# Patient Record
Sex: Female | Born: 1945
Health system: Southern US, Community
[De-identification: ages and names within clinical notes are randomized; demographics above are authoritative.]

## PROBLEM LIST (undated history)

## (undated) DIAGNOSIS — K909 Intestinal malabsorption, unspecified: Secondary | ICD-10-CM

## (undated) DIAGNOSIS — F329 Major depressive disorder, single episode, unspecified: Secondary | ICD-10-CM

## (undated) DIAGNOSIS — F419 Anxiety disorder, unspecified: Secondary | ICD-10-CM

## (undated) DIAGNOSIS — H9190 Unspecified hearing loss, unspecified ear: Secondary | ICD-10-CM

## (undated) DIAGNOSIS — D649 Anemia, unspecified: Secondary | ICD-10-CM

## (undated) DIAGNOSIS — Z7189 Other specified counseling: Secondary | ICD-10-CM

## (undated) DIAGNOSIS — N898 Other specified noninflammatory disorders of vagina: Secondary | ICD-10-CM

## (undated) DIAGNOSIS — H04123 Dry eye syndrome of bilateral lacrimal glands: Secondary | ICD-10-CM

## (undated) DIAGNOSIS — C9000 Multiple myeloma not having achieved remission: Secondary | ICD-10-CM

## (undated) DIAGNOSIS — I10 Essential (primary) hypertension: Secondary | ICD-10-CM

## (undated) DIAGNOSIS — D500 Iron deficiency anemia secondary to blood loss (chronic): Secondary | ICD-10-CM

## (undated) DIAGNOSIS — H269 Unspecified cataract: Secondary | ICD-10-CM

## (undated) DIAGNOSIS — C4490 Unspecified malignant neoplasm of skin, unspecified: Secondary | ICD-10-CM

## (undated) DIAGNOSIS — F32A Depression, unspecified: Secondary | ICD-10-CM

## (undated) DIAGNOSIS — E785 Hyperlipidemia, unspecified: Secondary | ICD-10-CM

## (undated) DIAGNOSIS — R002 Palpitations: Secondary | ICD-10-CM

## (undated) DIAGNOSIS — Z923 Personal history of irradiation: Secondary | ICD-10-CM

## (undated) DIAGNOSIS — M1990 Unspecified osteoarthritis, unspecified site: Secondary | ICD-10-CM

## (undated) DIAGNOSIS — G4700 Insomnia, unspecified: Secondary | ICD-10-CM

## (undated) HISTORY — DX: Dry eye syndrome of bilateral lacrimal glands: H04.123

## (undated) HISTORY — DX: Insomnia, unspecified: G47.00

## (undated) HISTORY — PX: WISDOM TOOTH EXTRACTION: SHX21

## (undated) HISTORY — DX: Hyperlipidemia, unspecified: E78.5

## (undated) HISTORY — DX: Personal history of irradiation: Z92.3

## (undated) HISTORY — DX: Other specified noninflammatory disorders of vagina: N89.8

## (undated) HISTORY — PX: EYE SURGERY: SHX253

## (undated) HISTORY — DX: Other specified counseling: Z71.89

## (undated) HISTORY — DX: Depression, unspecified: F32.A

## (undated) HISTORY — PX: FACIAL COSMETIC SURGERY: SHX629

## (undated) HISTORY — PX: NO PAST SURGERIES: SHX2092

## (undated) HISTORY — DX: Multiple myeloma not having achieved remission: C90.00

## (undated) HISTORY — DX: Major depressive disorder, single episode, unspecified: F32.9

## (undated) HISTORY — DX: Palpitations: R00.2

---
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
## 1997-11-21 ENCOUNTER — Ambulatory Visit (HOSPITAL_COMMUNITY): Admission: RE | Admit: 1997-11-21 | Discharge: 1997-11-21 | Payer: Self-pay | Admitting: Cardiology

## 2001-06-25 ENCOUNTER — Other Ambulatory Visit: Admission: RE | Admit: 2001-06-25 | Discharge: 2001-06-25 | Payer: Self-pay | Admitting: *Deleted

## 2003-08-04 ENCOUNTER — Other Ambulatory Visit: Admission: RE | Admit: 2003-08-04 | Discharge: 2003-08-04 | Payer: Self-pay | Admitting: Internal Medicine

## 2005-10-14 ENCOUNTER — Other Ambulatory Visit: Admission: RE | Admit: 2005-10-14 | Discharge: 2005-10-14 | Payer: Self-pay | Admitting: Internal Medicine

## 2006-10-20 ENCOUNTER — Other Ambulatory Visit: Admission: RE | Admit: 2006-10-20 | Discharge: 2006-10-20 | Payer: Self-pay | Admitting: Internal Medicine

## 2008-05-16 ENCOUNTER — Ambulatory Visit: Payer: Self-pay | Admitting: Internal Medicine

## 2008-11-03 ENCOUNTER — Ambulatory Visit: Payer: Self-pay | Admitting: Internal Medicine

## 2009-05-16 ENCOUNTER — Ambulatory Visit: Payer: Self-pay | Admitting: Internal Medicine

## 2009-06-13 ENCOUNTER — Ambulatory Visit: Payer: Self-pay | Admitting: Internal Medicine

## 2009-07-10 ENCOUNTER — Ambulatory Visit: Payer: Self-pay | Admitting: Internal Medicine

## 2009-08-07 ENCOUNTER — Ambulatory Visit: Payer: Self-pay | Admitting: Internal Medicine

## 2009-12-08 ENCOUNTER — Encounter: Admission: RE | Admit: 2009-12-08 | Discharge: 2009-12-08 | Payer: Self-pay | Admitting: Internal Medicine

## 2010-01-01 ENCOUNTER — Ambulatory Visit: Payer: Self-pay | Admitting: Internal Medicine

## 2010-02-13 ENCOUNTER — Other Ambulatory Visit: Admission: RE | Admit: 2010-02-13 | Discharge: 2010-02-13 | Payer: Self-pay | Admitting: Internal Medicine

## 2010-02-13 ENCOUNTER — Ambulatory Visit: Payer: Self-pay | Admitting: Internal Medicine

## 2010-08-17 ENCOUNTER — Ambulatory Visit
Admission: RE | Admit: 2010-08-17 | Discharge: 2010-08-17 | Payer: Self-pay | Source: Home / Self Care | Attending: Internal Medicine | Admitting: Internal Medicine

## 2010-12-10 ENCOUNTER — Other Ambulatory Visit: Payer: Self-pay | Admitting: *Deleted

## 2010-12-10 DIAGNOSIS — I10 Essential (primary) hypertension: Secondary | ICD-10-CM

## 2010-12-10 MED ORDER — NEBIVOLOL HCL 5 MG PO TABS: 5.0000 mg | ORAL_TABLET | Freq: Every day | ORAL | Status: DC

## 2011-02-11 ENCOUNTER — Encounter: Payer: Self-pay | Admitting: Internal Medicine

## 2011-02-12 ENCOUNTER — Other Ambulatory Visit: Payer: BC Managed Care – PPO | Admitting: Internal Medicine

## 2011-02-12 DIAGNOSIS — E785 Hyperlipidemia, unspecified: Secondary | ICD-10-CM

## 2011-02-12 DIAGNOSIS — Z79899 Other long term (current) drug therapy: Secondary | ICD-10-CM

## 2011-02-12 LAB — HEPATIC FUNCTION PANEL
AST: 22 U/L (ref 0–37)
Alkaline Phosphatase: 73 U/L (ref 39–117)
Bilirubin, Direct: 0.1 mg/dL (ref 0.0–0.3)
Total Bilirubin: 0.5 mg/dL (ref 0.3–1.2)
Total Protein: 7.1 g/dL (ref 6.0–8.3)

## 2011-02-12 LAB — LIPID PANEL
HDL: 71 mg/dL (ref >39)
Triglycerides: 73 mg/dL (ref <150)
VLDL: 15 mg/dL (ref 0–40)

## 2011-02-14 ENCOUNTER — Ambulatory Visit (INDEPENDENT_AMBULATORY_CARE_PROVIDER_SITE_OTHER): Payer: BC Managed Care – PPO | Admitting: Internal Medicine

## 2011-02-14 ENCOUNTER — Encounter: Payer: Self-pay | Admitting: Internal Medicine

## 2011-02-14 DIAGNOSIS — E785 Hyperlipidemia, unspecified: Secondary | ICD-10-CM

## 2011-02-14 DIAGNOSIS — I10 Essential (primary) hypertension: Secondary | ICD-10-CM

## 2011-02-14 DIAGNOSIS — F419 Anxiety disorder, unspecified: Secondary | ICD-10-CM

## 2011-02-14 DIAGNOSIS — F411 Generalized anxiety disorder: Secondary | ICD-10-CM

## 2011-02-15 ENCOUNTER — Encounter: Payer: Self-pay | Admitting: Internal Medicine

## 2011-02-15 DIAGNOSIS — F419 Anxiety disorder, unspecified: Secondary | ICD-10-CM | POA: Insufficient documentation

## 2011-02-15 DIAGNOSIS — E785 Hyperlipidemia, unspecified: Secondary | ICD-10-CM | POA: Insufficient documentation

## 2011-02-15 DIAGNOSIS — I10 Essential (primary) hypertension: Secondary | ICD-10-CM | POA: Insufficient documentation

## 2011-02-15 NOTE — Patient Instructions (Signed)
Continue medications as previously prescribed. Consider counselor for discussion regarding anxiety and issues surrounding of memory

## 2011-02-15 NOTE — Progress Notes (Signed)
  Subjective:    Patient ID: Debra Ruiz, female    DOB: Apr 17, 1946, 65 y.o.   MRN: [id number]  HPI  in today to followup on hyperlipidemia. Recent fasting lipid panel and liver functions are very acceptable. She is on generic Lipitor 20 mg daily. She has some anxiety regarding memory loss. Several female family members on maternal side have developed dementia and she is concerned. She has some forgetfulness but not what I would call early dementia. She also has hypertension and is on HCTZ and Norvasc 5 mg daily. Blood pressure is a bit elevated today but she seems anxious. I recommended a counselor for her to see her regarding anxiety and she will consider it. She will be enrolling in Medicare soon and is looking for a Medicare supplement.    Review of Systems     Objective:   Physical Exam chest clear; cardiac exam regular rate and rhythm, normal S1/S2; extremities without edema        Assessment & Plan:   Hyperlipidemia  Hypertension  Anxiety  Plan is to return in 6 months for physical exam

## 2011-03-04 ENCOUNTER — Other Ambulatory Visit: Payer: Self-pay | Admitting: Internal Medicine

## 2011-06-17 ENCOUNTER — Encounter: Payer: Self-pay | Admitting: Internal Medicine

## 2011-06-17 ENCOUNTER — Ambulatory Visit (INDEPENDENT_AMBULATORY_CARE_PROVIDER_SITE_OTHER): Payer: Medicare Other | Admitting: Internal Medicine

## 2011-06-17 ENCOUNTER — Ambulatory Visit: Payer: BC Managed Care – PPO | Admitting: Internal Medicine

## 2011-06-17 DIAGNOSIS — F329 Major depressive disorder, single episode, unspecified: Secondary | ICD-10-CM

## 2011-06-17 DIAGNOSIS — F3289 Other specified depressive episodes: Secondary | ICD-10-CM

## 2011-06-17 DIAGNOSIS — F419 Anxiety disorder, unspecified: Secondary | ICD-10-CM

## 2011-06-17 DIAGNOSIS — F32A Depression, unspecified: Secondary | ICD-10-CM

## 2011-06-17 DIAGNOSIS — F411 Generalized anxiety disorder: Secondary | ICD-10-CM

## 2011-06-17 NOTE — Patient Instructions (Signed)
Stop Prozac and began taking Pristiq 50 mg daily. Return in 3 weeks. We will arrange for neuropsychological testing

## 2011-06-17 NOTE — Progress Notes (Signed)
  Subjective:    Patient ID: Debra Ruiz, female    DOB: [birth year], 65 y.o.   MRN: [id number]  HPI patient has taken Prozac for many years for depression. She is also anxious. Her mother and her maternal grandmother both had dementia presumably Alzheimer's type. This has worried her for a long time that she might develop the same thing. Also, she is angry at her husband because a we'll he made included he has 2 adult children. The will included her but not to the extent she thought she should be. She also was concerned that 2 nephews on her side of the family were left out at the wheel. Says that she's been sure with her husband recently. She and her husband each drank about 4 glasses of wine a night have done this for years. She says that "my thinking is not right ". She is seeing [facility] for counseling. Sharl Ma suggested some neuropsychological testing. We will try to arrange that. Also she's not had fasting labs in several months and we will arrange that in the near future. She has an upcoming appointment in late January for physical examination. She has no children. She is worried about having to go to the memory care Center should she develop dementia. She is afraid she will have enough money to do that. Suggested she might want to speak with the financial counselor but she doesn't think her husband will be agreeable to that. She is not suicidal. Does not want influenza immunization. Wants to try something other than Prozac.    Review of Systems     Objective:   Physical Exam no thyromegaly; chest clear; cardiac exam regular rate and rhythm        Assessment & Plan:  Anxiety  Depression  Plan: Patient will be referred for neuropsychological testing. She'll have fasting lab work including TSH year through this office in the near future. I have started her on Pristiq 50 mg daily. She will discontinue Prozac. Return in 3 weeks. Time spent with patient 30 minutes

## 2011-06-21 ENCOUNTER — Other Ambulatory Visit: Payer: Medicare Other | Admitting: Internal Medicine

## 2011-06-21 DIAGNOSIS — Z0000 Encounter for general adult medical examination without abnormal findings: Secondary | ICD-10-CM

## 2011-06-21 DIAGNOSIS — E785 Hyperlipidemia, unspecified: Secondary | ICD-10-CM

## 2011-06-21 DIAGNOSIS — I10 Essential (primary) hypertension: Secondary | ICD-10-CM

## 2011-06-21 DIAGNOSIS — E559 Vitamin D deficiency, unspecified: Secondary | ICD-10-CM

## 2011-06-21 LAB — COMPREHENSIVE METABOLIC PANEL
CO2: 29 mEq/L (ref 19–32)
Sodium: 140 mEq/L (ref 135–145)
Total Bilirubin: 0.5 mg/dL (ref 0.3–1.2)
Total Protein: 7.4 g/dL (ref 6.0–8.3)

## 2011-06-21 LAB — CBC WITH DIFFERENTIAL/PLATELET
Basophils Absolute: 0 10*3/uL (ref 0.0–0.1)
Hemoglobin: 15.3 g/dL — ABNORMAL HIGH (ref 12.0–15.0)
Lymphocytes Relative: 43 % (ref 12–46)
MCHC: 34.2 g/dL (ref 30.0–36.0)
MCV: 96.8 fL (ref 78.0–100.0)
Monocytes Absolute: 0.5 10*3/uL (ref 0.1–1.0)
Neutro Abs: 2.3 10*3/uL (ref 1.7–7.7)
Neutrophils Relative %: 45 % (ref 43–77)
RBC: 4.63 MIL/uL (ref 3.87–5.11)

## 2011-06-21 LAB — TSH: TSH: 2.378 u[IU]/mL (ref 0.350–4.500)

## 2011-06-21 LAB — LIPID PANEL
HDL: 59 mg/dL (ref >39)
LDL Cholesterol: 89 mg/dL (ref 0–99)
Triglycerides: 92 mg/dL (ref <150)

## 2011-06-22 LAB — VITAMIN D 25 HYDROXY (VIT D DEFICIENCY, FRACTURES): Vit D, 25-Hydroxy: 63 ng/mL (ref 30–89)

## 2011-07-08 ENCOUNTER — Ambulatory Visit (INDEPENDENT_AMBULATORY_CARE_PROVIDER_SITE_OTHER): Payer: Medicare Other | Admitting: Internal Medicine

## 2011-07-08 ENCOUNTER — Encounter: Payer: Self-pay | Admitting: Internal Medicine

## 2011-07-08 VITALS — BP 140/76 | HR 76 | Temp 98.1°F | Wt 123.8 lb

## 2011-07-08 DIAGNOSIS — F411 Generalized anxiety disorder: Secondary | ICD-10-CM

## 2011-07-08 DIAGNOSIS — F32A Depression, unspecified: Secondary | ICD-10-CM

## 2011-07-08 DIAGNOSIS — F3289 Other specified depressive episodes: Secondary | ICD-10-CM

## 2011-07-08 DIAGNOSIS — F329 Major depressive disorder, single episode, unspecified: Secondary | ICD-10-CM

## 2011-07-08 DIAGNOSIS — F419 Anxiety disorder, unspecified: Secondary | ICD-10-CM

## 2011-07-08 DIAGNOSIS — R413 Other amnesia: Secondary | ICD-10-CM

## 2011-07-08 NOTE — Progress Notes (Signed)
  Subjective:    Patient ID: Debra Ruiz, female    DOB: 02-04-1946, 65 y.o.   MRN: [id number]  HPI three-week followup today of anxiety and depression. At last visit was changed from Prozac to Pristiq 50 mg daily. She looks good today and feels like she has more energy. I have given her samples of prostate at last visit and she became agitated when she could not find one packet of samples. She subsequently found it after she looked around a bit but this was worrisome to her. Feels that she is irritable with her husband. Continues to see [facility] for counseling. We are trying to get her neuropsychological testing but have had some difficulty getting an appointment. We'll continue to pursue this but she wants to wait until the first of the year since she is going to [place].    Review of Systems     Objective:   Physical Exam alert and oriented x3. Chest clear to auscultation; cardiac exam regular rate and rhythm; extremities without edema. Affect is slightly anxious.        Assessment & Plan:  Anxiety  Depression  Forgetfulness with family history of dementia  Plan: Patient had been scheduled for physical exam in January but we are going to defer that until June since regarding done an extensive workup recently. Continue Pristiq 50 mg daily. 5 weeks worth of samples given. In January she'll go on Medicare. Continue to try to get appointment for neuropsychological testing January 2013.

## 2011-07-08 NOTE — Patient Instructions (Signed)
Continue prostate 50 mg daily. We will continue to pursue appointment for neuropsychological testing in January. Return in 6 months or soon her if needed.

## 2011-07-29 ENCOUNTER — Telehealth: Payer: Self-pay | Admitting: Internal Medicine

## 2011-07-29 NOTE — Telephone Encounter (Signed)
Spoke with Debra Ruiz from Dr. Karma Ganja office.  Pt is scheduled to see him on Wednesday, January 23rd at 11:30.  Patient is aware.

## 2011-07-30 DIAGNOSIS — H01009 Unspecified blepharitis unspecified eye, unspecified eyelid: Secondary | ICD-10-CM | POA: Diagnosis not present

## 2011-07-30 DIAGNOSIS — H04129 Dry eye syndrome of unspecified lacrimal gland: Secondary | ICD-10-CM | POA: Diagnosis not present

## 2011-07-30 DIAGNOSIS — H52209 Unspecified astigmatism, unspecified eye: Secondary | ICD-10-CM | POA: Diagnosis not present

## 2011-07-30 DIAGNOSIS — H5200 Hypermetropia, unspecified eye: Secondary | ICD-10-CM | POA: Diagnosis not present

## 2011-08-14 DIAGNOSIS — F3289 Other specified depressive episodes: Secondary | ICD-10-CM | POA: Diagnosis not present

## 2011-08-14 DIAGNOSIS — I10 Essential (primary) hypertension: Secondary | ICD-10-CM | POA: Diagnosis not present

## 2011-08-14 DIAGNOSIS — F329 Major depressive disorder, single episode, unspecified: Secondary | ICD-10-CM | POA: Diagnosis not present

## 2011-08-14 DIAGNOSIS — F411 Generalized anxiety disorder: Secondary | ICD-10-CM | POA: Diagnosis not present

## 2011-08-15 ENCOUNTER — Telehealth: Payer: Self-pay | Admitting: Internal Medicine

## 2011-08-16 NOTE — Telephone Encounter (Signed)
Dr Baxley made aware.   

## 2011-09-11 ENCOUNTER — Telehealth: Payer: Self-pay | Admitting: Internal Medicine

## 2011-09-11 NOTE — Telephone Encounter (Signed)
Pt called back and stated when she called the pharmacy to have them send [place] a refill request, they stated a her prescription for Lexapro had already been called in.  Patient asked [place] to disregard her refill request.

## 2011-09-24 ENCOUNTER — Other Ambulatory Visit: Payer: Self-pay | Admitting: Internal Medicine

## 2011-11-01 ENCOUNTER — Other Ambulatory Visit: Payer: Self-pay | Admitting: Internal Medicine

## 2011-11-01 ENCOUNTER — Other Ambulatory Visit: Payer: Self-pay

## 2011-11-01 MED ORDER — POTASSIUM CHLORIDE CRYS ER 20 MEQ PO TBCR: 20.0000 meq | EXTENDED_RELEASE_TABLET | Freq: Every day | ORAL | Status: DC

## 2011-11-06 ENCOUNTER — Other Ambulatory Visit: Payer: Self-pay | Admitting: Internal Medicine

## 2011-12-23 ENCOUNTER — Other Ambulatory Visit: Payer: Medicare Other | Admitting: Internal Medicine

## 2011-12-23 DIAGNOSIS — M810 Age-related osteoporosis without current pathological fracture: Secondary | ICD-10-CM

## 2011-12-23 DIAGNOSIS — E785 Hyperlipidemia, unspecified: Secondary | ICD-10-CM | POA: Diagnosis not present

## 2011-12-23 DIAGNOSIS — Z780 Asymptomatic menopausal state: Secondary | ICD-10-CM | POA: Diagnosis not present

## 2011-12-23 DIAGNOSIS — I10 Essential (primary) hypertension: Secondary | ICD-10-CM

## 2011-12-23 LAB — COMPREHENSIVE METABOLIC PANEL
AST: 20 U/L (ref 0–37)
Albumin: 4.4 g/dL (ref 3.5–5.2)
Alkaline Phosphatase: 63 U/L (ref 39–117)
Total Bilirubin: 0.5 mg/dL (ref 0.3–1.2)
Total Protein: 6.9 g/dL (ref 6.0–8.3)

## 2011-12-23 LAB — LIPID PANEL
Cholesterol: 185 mg/dL (ref 0–200)
LDL Cholesterol: 104 mg/dL — ABNORMAL HIGH (ref 0–99)
Triglycerides: 69 mg/dL (ref <150)
VLDL: 14 mg/dL (ref 0–40)

## 2011-12-23 LAB — CBC WITH DIFFERENTIAL/PLATELET
Basophils Absolute: 0 10*3/uL (ref 0.0–0.1)
HCT: 41.9 % (ref 36.0–46.0)
Lymphocytes Relative: 51 % — ABNORMAL HIGH (ref 12–46)
MCH: 31.9 pg (ref 26.0–34.0)
MCV: 92.3 fL (ref 78.0–100.0)
Monocytes Absolute: 0.3 10*3/uL (ref 0.1–1.0)
Monocytes Relative: 9 % (ref 3–12)
Neutro Abs: 1.4 10*3/uL — ABNORMAL LOW (ref 1.7–7.7)
Platelets: 257 10*3/uL (ref 150–400)
RDW: 13.3 % (ref 11.5–15.5)
WBC: 3.7 10*3/uL — ABNORMAL LOW (ref 4.0–10.5)

## 2011-12-24 ENCOUNTER — Encounter: Payer: Self-pay | Admitting: Internal Medicine

## 2011-12-24 ENCOUNTER — Ambulatory Visit (INDEPENDENT_AMBULATORY_CARE_PROVIDER_SITE_OTHER): Payer: Medicare Other | Admitting: Internal Medicine

## 2011-12-24 VITALS — BP 136/64 | HR 72 | Ht 61.0 in | Wt 126.0 lb

## 2011-12-24 DIAGNOSIS — Z0000 Encounter for general adult medical examination without abnormal findings: Secondary | ICD-10-CM | POA: Diagnosis not present

## 2011-12-24 DIAGNOSIS — H04129 Dry eye syndrome of unspecified lacrimal gland: Secondary | ICD-10-CM

## 2011-12-24 DIAGNOSIS — H9190 Unspecified hearing loss, unspecified ear: Secondary | ICD-10-CM

## 2011-12-24 DIAGNOSIS — M545 Low back pain, unspecified: Secondary | ICD-10-CM

## 2011-12-24 DIAGNOSIS — K219 Gastro-esophageal reflux disease without esophagitis: Secondary | ICD-10-CM | POA: Diagnosis not present

## 2011-12-24 DIAGNOSIS — I10 Essential (primary) hypertension: Secondary | ICD-10-CM

## 2011-12-24 LAB — POCT URINALYSIS DIPSTICK
Blood, UA: NEGATIVE
Glucose, UA: NEGATIVE
Ketones, UA: NEGATIVE
Leukocytes, UA: NEGATIVE
Nitrite, UA: NEGATIVE
Urobilinogen, UA: NEGATIVE
pH, UA: 6

## 2011-12-24 LAB — VITAMIN D 25 HYDROXY (VIT D DEFICIENCY, FRACTURES): Vit D, 25-Hydroxy: 48 ng/mL (ref 30–89)

## 2012-01-27 DIAGNOSIS — H04129 Dry eye syndrome of unspecified lacrimal gland: Secondary | ICD-10-CM | POA: Diagnosis not present

## 2012-02-12 DIAGNOSIS — H01009 Unspecified blepharitis unspecified eye, unspecified eyelid: Secondary | ICD-10-CM | POA: Diagnosis not present

## 2012-02-12 DIAGNOSIS — H04129 Dry eye syndrome of unspecified lacrimal gland: Secondary | ICD-10-CM | POA: Diagnosis not present

## 2012-03-12 DIAGNOSIS — H01009 Unspecified blepharitis unspecified eye, unspecified eyelid: Secondary | ICD-10-CM | POA: Diagnosis not present

## 2012-03-12 DIAGNOSIS — H04129 Dry eye syndrome of unspecified lacrimal gland: Secondary | ICD-10-CM | POA: Diagnosis not present

## 2012-03-21 DIAGNOSIS — H04129 Dry eye syndrome of unspecified lacrimal gland: Secondary | ICD-10-CM | POA: Insufficient documentation

## 2012-03-21 DIAGNOSIS — K219 Gastro-esophageal reflux disease without esophagitis: Secondary | ICD-10-CM | POA: Insufficient documentation

## 2012-03-21 DIAGNOSIS — M545 Low back pain, unspecified: Secondary | ICD-10-CM | POA: Insufficient documentation

## 2012-03-21 DIAGNOSIS — H9190 Unspecified hearing loss, unspecified ear: Secondary | ICD-10-CM | POA: Insufficient documentation

## 2012-03-21 NOTE — Patient Instructions (Addendum)
Please continue same medications and return in 6 months for office visit, blood pressure check, fasting lipid panel, liver functions. If he will not have colonoscopy, please return in 3 Hemoccult cards to the office. Bone density study due 2014. Continue with annual mammogram and annual influenza immunization.

## 2012-03-21 NOTE — Progress Notes (Signed)
Subjective:    Patient ID: Debra Ruiz, female    DOB: 1945/08/31, 66 y.o.   MRN: [id number]  HPI 66 year old white female in today for Welcome to Medicare exam and evaluation of medical problems including depression, hypertension, hyperlipidemia, GE reflux, insomnia, and patient concerned about her memory. She had a Pap smear and July 2011 which was normal. In 2011, she had issues with back pain and saw Dr. Darrelyn Hillock treated her with a 12 day prednisone dosepak. She subsequently saw Dr. Danielle Dess for evaluation and improved without having epidural steroid injections. MRI of the LS-spine in May 2011 showed left foraminal narrowing due to disc and facet arthropathy. She has spondylosis at L3-L4 where there was moderately severe central canal stenosis and advanced facet arthropathy resulting in anterolisthesis.  Also had marked left foraminal narrowing at L4-L5 due to disc disease and facet arthropathy.  She had a normal bone density study 02/21/2010. Gets annual mammogram. Had Zostavax vaccine for 08/25/06 at [facility]. She has history of dry eye syndrome and uses Restasis per ophthalmologist. Had sigmoidoscopy by Dr. Matthias Hughs around the year 2000. Never had colonoscopy and was asking 2010 to consider 1. A Cardiolite study was done in 1999 which was negative for ischemia done by Dr. Donnie Aho.  Patient unable to tolerate Crestor according to old records when she was under the care of Dr. Heather Roberts as apparently liver functions increased.  No history of operations, fractures, or illnesses requiring hospitalization.  Family history: Father died at age 27 of an MI with history of hypertension. Mother died at age 85 of a stroke with history of hypertension. Brother with history of brain tumor and cardiac defibrillator. Has a twin sister who lives in [place] .  Sister with history of hypertension and hyperlipidemia. There is a family history of Alzheimer's disease.  Patient was  referred to [facility] in the fall of 2012 for counseling. She also was evaluated at [facility] neurology Department in 2006. I do not have a copy of that report.  She is currently a nonsmoker but used to smoke years ago. Drinks alcohol for 5 times a week. Is married. History of chronic dysthymia and fear of getting Alzheimer's disease. This dates back to at least 2002 according to Dr. Donnamarie Poag records. She grew up in [place], [place] and came to [place] 1979 to work for an [employer]. She's been married 3 times. 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 History of urge urinary incontinence. Tubal ligation in 1977. No children.  She was sent to [facility] neuropsychiatry in January 2013 for evaluation of possible memory disorder. She had good verbal memory but some impairment with visual memory. She noted improvement with depression being changed from Prozac to Pristiq. It was recommended she be retested in one year.    Review of Systems  Constitutional: Negative.   HENT: Negative.   Eyes:       History of dry eyes  Cardiovascular: Negative.   Gastrointestinal:       History of GE reflux  Genitourinary: Positive for urgency.       History of vaginal dryness  Musculoskeletal: Positive for back pain.  Hematological: Negative.   Psychiatric/Behavioral: Positive for dysphoric mood.       Insomnia and concerns about memory       Objective:   Physical Exam  Vitals reviewed. Constitutional: She is oriented to person, place, and time. She appears well-developed and well-nourished.  HENT:  Head: Normocephalic and atraumatic.  Right Ear: External ear normal.  Left Ear: External ear normal.  Mouth/Throat: Oropharynx is clear and moist.  Eyes: Conjunctivae and EOM are normal. Pupils are equal, round, and reactive to light. Right eye exhibits no discharge. Left eye exhibits no discharge. No scleral icterus.  Neck: Neck  supple. No JVD present. No thyromegaly present.  Cardiovascular: Normal rate, regular rhythm and normal heart sounds.   No murmur heard. Pulmonary/Chest: Effort normal and breath sounds normal. She has no wheezes. She has no rales.  Abdominal: Soft. Bowel sounds are normal. She exhibits no distension and no mass. There is no tenderness. There is no rebound and no guarding.  Genitourinary:       Bimanual exam normal  Musculoskeletal: Normal range of motion. She exhibits no edema.  Lymphadenopathy:    She has no cervical adenopathy.  Neurological: She is oriented to person, place, and time. She has normal reflexes. No cranial nerve deficit. Coordination normal.  Skin: Skin is warm and dry. No rash noted.  Psychiatric: She has a normal mood and affect. Her behavior is normal. Judgment and thought content normal.    Subjective:   Patient presents for Medicare Annual/Subsequent preventive examination.  Review Past Medical/Family/Social: See above history   Risk Factors  Current exercise habits: some walking Dietary issues discussed: Low fat Low carb diet  Cardiac risk factors: HTN, Hyperlipidemia, Family History  Depression Screen  (Note: if answer to either of the following is "Yes", a more complete depression screening is indicated)   Over the past two weeks, have you felt down, depressed or hopeless? yes  Over the past two weeks, have you felt little interest or pleasure in doing things? yes Have you lost interest or pleasure in daily life? No Do you often feel hopeless? rarely Do you cry easily over simple problems? No   Activities of Daily Living  In your present state of health, do you have any difficulty performing the following activities?:   Driving? No  Managing money? No  Feeding yourself? No  Getting from bed to chair? No  Climbing a flight of stairs? No  Preparing food and eating?: No  Bathing or showering? No  Getting dressed: No  Getting to the toilet? No    Using the toilet:No  Moving around from place to place: No  In the past year have you fallen or had a near fall?:No  Are you sexually active? No  Do you have more than one partner? No   Hearing Difficulties: No  Do you often ask people to speak up or repeat themselves? No  Do you experience ringing or noises in your ears? No  Do you have difficulty understanding soft or whispered voices? yes Do you feel that you have a problem with memory? yes Do you often misplace items? No    Home Safety:  Do you have a smoke alarm at your residence? Yes Do you have grab bars in the bathroom? No   Do you have throw rugs in your house? yes   Cognitive Testing  Alert? Yes Normal Appearance?Yes  Oriented to person? Yes Place? Yes  Time? Yes  Recall of three objects? Yes  Can perform simple calculations? Yes  Displays appropriate judgment?Yes  Can read the correct time from a watch face?Yes   List the Names of Other Physician/Practitioners you currently use:  See referral list for the physicians patient is currently seeing.     Review of Systems:   Objective:  General appearance: Appears stated age  Head: Normocephalic, without obvious abnormality, atraumatic  Eyes: conj clear, EOMi PEERLA  Ears: normal TM's and external ear canals both ears  Nose: Nares normal. Septum midline. Mucosa normal. No drainage or sinus tenderness.  Throat: lips, mucosa, and tongue normal; teeth and gums normal  Neck: no adenopathy, no carotid bruit, no JVD, supple, symmetrical, trachea midline and thyroid not enlarged, symmetric, no tenderness/mass/nodules  No CVA tenderness.  Lungs: clear to auscultation bilaterally  Breasts: normal appearance, no masses or tenderness,  Heart: regular rate and rhythm, S1, S2 normal, no murmur, click, rub or gallop  Abdomen: soft, non-tender; bowel sounds normal; no masses, no organomegaly  Musculoskeletal: ROM normal in all joints, no crepitus, no deformity, Normal  muscle strengthen. Back  is symmetric, no curvature. Skin: Skin color, texture, turgor normal. No rashes or lesions  Lymph nodes: Cervical, supraclavicular, and axillary nodes normal.  Neurologic: CN 2 -12 Normal, Normal symmetric reflexes. Normal coordination and gait  Psych: Alert & Oriented x 3, Mood appears stable.    Assessment:    Annual wellness medicare exam   Plan:    During the course of the visit the patient was educated and counseled about appropriate screening and preventive services including:   Colonoscopy recommended. Patient will consider it. Suggest repeat testing in [facility] by Dr. Shane Crutch in 1-2 years for cognitive deficits.       Patient Instructions (the written plan) was given to the patient.  Medicare Attestation  I have personally reviewed:  The patient's medical and social history  Their use of alcohol, tobacco or illicit drugs  Their current medications and supplements  The patient's functional ability including ADLs,fall risks, home safety risks, cognitive, and hearing and visual impairment  Diet and physical activities  Evidence for depression or mood disorders  The patient's weight, height, BMI, and visual acuity have been recorded in the chart. I have made referrals, counseling, and provided education to the patient based on review of the above and I have provided the patient with a written personalized care plan for preventive services.            Assessment & Plan:  Hypertension  Hyperlipidemia  Anxiety depression-concern for memory loss. Has been tested by [facility] neuropsychiatry. Improvement on Pristiq  History of hearing loss-evaluated by Dr. Tia Masker. Says she may need to have hearing aids in the future.  GE reflux  Dry eye syndrome  Plan: Annual mammogram, bone density study done in 2011 was normal and can be repeated in 2014. Return in 6 months for office visit, blood pressure check, fasting lipid  panel liver functions on statin therapy. Recommend colonoscopy. If she will not consider that, suggested she return 3 Hemoccult cards to the office.

## 2012-03-24 ENCOUNTER — Other Ambulatory Visit: Payer: Self-pay | Admitting: Internal Medicine

## 2012-03-24 ENCOUNTER — Other Ambulatory Visit: Payer: Self-pay

## 2012-03-24 DIAGNOSIS — I10 Essential (primary) hypertension: Secondary | ICD-10-CM

## 2012-03-24 MED ORDER — AMLODIPINE BESYLATE 5 MG PO TABS: 5.0000 mg | ORAL_TABLET | Freq: Every day | ORAL | Status: DC

## 2012-03-24 NOTE — Telephone Encounter (Signed)
Refill amlodipine prn one year to pharmacy

## 2012-03-24 NOTE — Telephone Encounter (Signed)
Have re ordered in [facility]

## 2012-04-23 DIAGNOSIS — H10409 Unspecified chronic conjunctivitis, unspecified eye: Secondary | ICD-10-CM | POA: Diagnosis not present

## 2012-04-23 DIAGNOSIS — H04129 Dry eye syndrome of unspecified lacrimal gland: Secondary | ICD-10-CM | POA: Diagnosis not present

## 2012-04-23 DIAGNOSIS — H01009 Unspecified blepharitis unspecified eye, unspecified eyelid: Secondary | ICD-10-CM | POA: Diagnosis not present

## 2012-06-12 DIAGNOSIS — Z13820 Encounter for screening for osteoporosis: Secondary | ICD-10-CM | POA: Diagnosis not present

## 2012-06-12 DIAGNOSIS — Z780 Asymptomatic menopausal state: Secondary | ICD-10-CM | POA: Diagnosis not present

## 2012-06-12 DIAGNOSIS — Z1231 Encounter for screening mammogram for malignant neoplasm of breast: Secondary | ICD-10-CM | POA: Diagnosis not present

## 2012-06-22 ENCOUNTER — Other Ambulatory Visit: Payer: Medicare Other | Admitting: Internal Medicine

## 2012-06-23 ENCOUNTER — Ambulatory Visit: Payer: Medicare Other | Admitting: Internal Medicine

## 2012-06-30 ENCOUNTER — Other Ambulatory Visit: Payer: Medicare Other | Admitting: Internal Medicine

## 2012-06-30 DIAGNOSIS — E785 Hyperlipidemia, unspecified: Secondary | ICD-10-CM | POA: Diagnosis not present

## 2012-06-30 DIAGNOSIS — Z79899 Other long term (current) drug therapy: Secondary | ICD-10-CM

## 2012-06-30 LAB — LIPID PANEL
Cholesterol: 189 mg/dL (ref 0–200)
HDL: 57 mg/dL
LDL Cholesterol: 113 mg/dL — ABNORMAL HIGH (ref 0–99)
Total CHOL/HDL Ratio: 3.3 ratio
Triglycerides: 96 mg/dL
VLDL: 19 mg/dL (ref 0–40)

## 2012-06-30 LAB — HEPATIC FUNCTION PANEL
ALT: 20 U/L (ref 0–35)
AST: 22 U/L (ref 0–37)
Albumin: 4.5 g/dL (ref 3.5–5.2)
Alkaline Phosphatase: 67 U/L (ref 39–117)
Bilirubin, Direct: 0.2 mg/dL (ref 0.0–0.3)
Indirect Bilirubin: 0.6 mg/dL (ref 0.0–0.9)
Total Bilirubin: 0.8 mg/dL (ref 0.3–1.2)
Total Protein: 7.2 g/dL (ref 6.0–8.3)

## 2012-07-01 DIAGNOSIS — H01009 Unspecified blepharitis unspecified eye, unspecified eyelid: Secondary | ICD-10-CM | POA: Insufficient documentation

## 2012-07-01 DIAGNOSIS — H0289 Other specified disorders of eyelid: Secondary | ICD-10-CM | POA: Diagnosis not present

## 2012-07-01 DIAGNOSIS — H2510 Age-related nuclear cataract, unspecified eye: Secondary | ICD-10-CM | POA: Diagnosis not present

## 2012-07-01 DIAGNOSIS — IMO0002 Reserved for concepts with insufficient information to code with codable children: Secondary | ICD-10-CM | POA: Insufficient documentation

## 2012-07-01 DIAGNOSIS — H02889 Meibomian gland dysfunction of unspecified eye, unspecified eyelid: Secondary | ICD-10-CM | POA: Insufficient documentation

## 2012-07-02 ENCOUNTER — Ambulatory Visit (INDEPENDENT_AMBULATORY_CARE_PROVIDER_SITE_OTHER): Payer: Medicare Other | Admitting: Internal Medicine

## 2012-07-02 ENCOUNTER — Encounter: Payer: Self-pay | Admitting: Internal Medicine

## 2012-07-02 VITALS — BP 134/70 | HR 68 | Resp 16 | Wt 128.0 lb

## 2012-07-02 DIAGNOSIS — T148XXA Other injury of unspecified body region, initial encounter: Secondary | ICD-10-CM

## 2012-07-02 DIAGNOSIS — E785 Hyperlipidemia, unspecified: Secondary | ICD-10-CM | POA: Diagnosis not present

## 2012-07-02 DIAGNOSIS — I10 Essential (primary) hypertension: Secondary | ICD-10-CM | POA: Diagnosis not present

## 2012-07-02 NOTE — Progress Notes (Signed)
  Subjective:    Patient ID: Davis Vannatter, female    DOB: 06/03/1946, 66 y.o.   MRN: [id number]  HPI 66 year old female in today for followup of hypertension and hyperlipidemia. Lipid panel essentially the same as it was in August 2013. No complaints or problems. In good spirits. Seems less anxious and worried. Declines influenza immunization today. Blood pressure under good control on current regimen. Going to [place] [employer] on vacation in January.  She took a fall about a month ago with some slippery shoes and bruised her left anterior leg. Still a bit swollen but not contused anymore    Review of Systems     Objective:   Physical Exam neck supple without thyromegaly JVD or carotid bruits. Chest clear to auscultation. Cardiac exam regular rate and rhythm normal S1 and S2. Extremities without edema.  Left anterior leg below left knee slightly swollen but no contusion. Good range of motion.      Assessment & Plan:  Contusion left anterior leg  Hypertension  Hyperlipidemia  Plan: Return September 2014 for physical examination. Continue same medications.

## 2012-07-02 NOTE — Patient Instructions (Addendum)
Continue same medications and return in early September 2014 for physical examination. Last physical exam 03/21/2012

## 2012-08-19 DIAGNOSIS — H2510 Age-related nuclear cataract, unspecified eye: Secondary | ICD-10-CM | POA: Diagnosis not present

## 2012-08-19 DIAGNOSIS — H0289 Other specified disorders of eyelid: Secondary | ICD-10-CM | POA: Diagnosis not present

## 2012-08-21 ENCOUNTER — Other Ambulatory Visit: Payer: Self-pay | Admitting: Internal Medicine

## 2012-08-28 ENCOUNTER — Other Ambulatory Visit: Payer: Self-pay | Admitting: Internal Medicine

## 2012-09-23 ENCOUNTER — Other Ambulatory Visit: Payer: Self-pay | Admitting: Dermatology

## 2012-09-23 DIAGNOSIS — D485 Neoplasm of uncertain behavior of skin: Secondary | ICD-10-CM | POA: Diagnosis not present

## 2012-09-23 DIAGNOSIS — L820 Inflamed seborrheic keratosis: Secondary | ICD-10-CM | POA: Diagnosis not present

## 2012-09-23 DIAGNOSIS — D234 Other benign neoplasm of skin of scalp and neck: Secondary | ICD-10-CM | POA: Diagnosis not present

## 2012-10-12 DIAGNOSIS — H0289 Other specified disorders of eyelid: Secondary | ICD-10-CM | POA: Diagnosis not present

## 2012-10-12 DIAGNOSIS — H2510 Age-related nuclear cataract, unspecified eye: Secondary | ICD-10-CM | POA: Diagnosis not present

## 2012-11-18 ENCOUNTER — Other Ambulatory Visit: Payer: Self-pay | Admitting: Internal Medicine

## 2013-01-19 ENCOUNTER — Other Ambulatory Visit: Payer: Self-pay | Admitting: Internal Medicine

## 2013-02-03 DIAGNOSIS — H2510 Age-related nuclear cataract, unspecified eye: Secondary | ICD-10-CM | POA: Diagnosis not present

## 2013-02-03 DIAGNOSIS — H0289 Other specified disorders of eyelid: Secondary | ICD-10-CM | POA: Diagnosis not present

## 2013-02-15 ENCOUNTER — Other Ambulatory Visit: Payer: Self-pay | Admitting: Internal Medicine

## 2013-03-10 DIAGNOSIS — H2510 Age-related nuclear cataract, unspecified eye: Secondary | ICD-10-CM | POA: Diagnosis not present

## 2013-03-10 DIAGNOSIS — H0289 Other specified disorders of eyelid: Secondary | ICD-10-CM | POA: Diagnosis not present

## 2013-05-05 ENCOUNTER — Other Ambulatory Visit: Payer: Self-pay | Admitting: *Deleted

## 2013-05-05 ENCOUNTER — Other Ambulatory Visit: Payer: Self-pay | Admitting: Internal Medicine

## 2013-05-05 MED ORDER — HYDROCHLOROTHIAZIDE 25 MG PO TABS: 25.0000 mg | ORAL_TABLET | Freq: Every day | ORAL | Status: DC

## 2013-05-05 MED ORDER — POTASSIUM CHLORIDE CRYS ER 20 MEQ PO TBCR: 20.0000 meq | EXTENDED_RELEASE_TABLET | Freq: Every day | ORAL | Status: DC

## 2013-05-17 ENCOUNTER — Encounter: Payer: Self-pay | Admitting: Internal Medicine

## 2013-05-17 ENCOUNTER — Ambulatory Visit (INDEPENDENT_AMBULATORY_CARE_PROVIDER_SITE_OTHER): Payer: Medicare Other | Admitting: Internal Medicine

## 2013-05-17 VITALS — BP 110/70 | HR 100 | Temp 97.8°F | Wt 127.0 lb

## 2013-05-17 DIAGNOSIS — F32A Depression, unspecified: Secondary | ICD-10-CM

## 2013-05-17 DIAGNOSIS — F419 Anxiety disorder, unspecified: Secondary | ICD-10-CM

## 2013-05-17 DIAGNOSIS — I10 Essential (primary) hypertension: Secondary | ICD-10-CM

## 2013-05-17 DIAGNOSIS — E785 Hyperlipidemia, unspecified: Secondary | ICD-10-CM | POA: Diagnosis not present

## 2013-05-17 DIAGNOSIS — F329 Major depressive disorder, single episode, unspecified: Secondary | ICD-10-CM

## 2013-05-17 DIAGNOSIS — F341 Dysthymic disorder: Secondary | ICD-10-CM

## 2013-05-17 DIAGNOSIS — Z23 Encounter for immunization: Secondary | ICD-10-CM | POA: Diagnosis not present

## 2013-05-17 DIAGNOSIS — E876 Hypokalemia: Secondary | ICD-10-CM

## 2013-05-17 MED ORDER — POTASSIUM CHLORIDE CRYS ER 20 MEQ PO TBCR: 20.0000 meq | EXTENDED_RELEASE_TABLET | Freq: Every day | ORAL | Status: DC

## 2013-05-17 MED ORDER — ESCITALOPRAM OXALATE 10 MG PO TABS: ORAL_TABLET | ORAL | Status: DC

## 2013-05-17 MED ORDER — HYDROCHLOROTHIAZIDE 25 MG PO TABS: ORAL_TABLET | ORAL | Status: DC

## 2013-05-17 MED ORDER — AMLODIPINE BESYLATE 5 MG PO TABS: 5.0000 mg | ORAL_TABLET | Freq: Every day | ORAL | Status: DC

## 2013-05-17 MED ORDER — BYSTOLIC 5 MG PO TABS: ORAL_TABLET | ORAL | Status: DC

## 2013-05-17 MED ORDER — ATORVASTATIN CALCIUM 20 MG PO TABS: ORAL_TABLET | ORAL | Status: DC

## 2013-05-17 NOTE — Patient Instructions (Addendum)
Pneumovax immunization given today. Return in 2 weeks for tetanus immunization. Flu vaccine declined. Return in 2 weeks for fasting lab work including fasting lipid panel liver functions and B-met. Schedule physical exam in 6 months.

## 2013-05-18 ENCOUNTER — Other Ambulatory Visit: Payer: Self-pay | Admitting: Internal Medicine

## 2013-05-23 ENCOUNTER — Encounter: Payer: Self-pay | Admitting: Internal Medicine

## 2013-05-23 NOTE — Progress Notes (Signed)
  Subjective:    Patient ID: Debra Ruiz, female    DOB: 1945/12/26, 67 y.o.   MRN: [id number]  HPI For  recheck on hypertension, depression, and hyperlipidemia. No complaints or problems. Feels well. She takes generic Lipitor, Norvasc, potassium supplement, diuretic, Bystolic and Lexapro.    Review of Systems     Objective:   Physical Exam skin is warm and dry. Nodes none. Chest clear to auscultation. Cardiac exam regular rate and rhythm normal S1 and S2. Extremities without edema. No JVD thyromegaly or carotid bruits. Affect is normal.        Assessment & Plan:  Depression-stable  Hypertension-under good control on current regimen  Hyperlipidemia  Hypokalemia on diuretic  Plan: Return in 6 months for physical exam.  In 2 weeks will return for tetanus immunization. She also will need to have fasting lipid panel liver functions and basic metabolic panel then. Pneumovax immunization given today.

## 2013-06-03 ENCOUNTER — Other Ambulatory Visit (INDEPENDENT_AMBULATORY_CARE_PROVIDER_SITE_OTHER): Payer: Medicare Other | Admitting: Internal Medicine

## 2013-06-03 DIAGNOSIS — E785 Hyperlipidemia, unspecified: Secondary | ICD-10-CM | POA: Diagnosis not present

## 2013-06-03 DIAGNOSIS — Z23 Encounter for immunization: Secondary | ICD-10-CM | POA: Diagnosis not present

## 2013-06-03 DIAGNOSIS — I10 Essential (primary) hypertension: Secondary | ICD-10-CM

## 2013-06-03 DIAGNOSIS — Z79899 Other long term (current) drug therapy: Secondary | ICD-10-CM

## 2013-06-03 LAB — HEPATIC FUNCTION PANEL
Albumin: 4.3 g/dL (ref 3.5–5.2)
Alkaline Phosphatase: 78 U/L (ref 39–117)
Bilirubin, Direct: 0.1 mg/dL (ref 0.0–0.3)
Indirect Bilirubin: 0.7 mg/dL (ref 0.0–0.9)
Total Bilirubin: 0.8 mg/dL (ref 0.3–1.2)

## 2013-06-03 LAB — BASIC METABOLIC PANEL
CO2: 29 mEq/L (ref 19–32)
Calcium: 9.5 mg/dL (ref 8.4–10.5)
Chloride: 100 mEq/L (ref 96–112)
Creat: 0.61 mg/dL (ref 0.50–1.10)
Glucose, Bld: 102 mg/dL — ABNORMAL HIGH (ref 70–99)

## 2013-06-03 LAB — LIPID PANEL
Cholesterol: 189 mg/dL (ref 0–200)
HDL: 60 mg/dL (ref >39)
LDL Cholesterol: 108 mg/dL — ABNORMAL HIGH (ref 0–99)

## 2013-06-09 DIAGNOSIS — H0289 Other specified disorders of eyelid: Secondary | ICD-10-CM | POA: Diagnosis not present

## 2013-06-09 DIAGNOSIS — H2510 Age-related nuclear cataract, unspecified eye: Secondary | ICD-10-CM | POA: Diagnosis not present

## 2013-09-24 DIAGNOSIS — D1801 Hemangioma of skin and subcutaneous tissue: Secondary | ICD-10-CM | POA: Diagnosis not present

## 2013-09-24 DIAGNOSIS — L820 Inflamed seborrheic keratosis: Secondary | ICD-10-CM | POA: Diagnosis not present

## 2013-09-24 DIAGNOSIS — L821 Other seborrheic keratosis: Secondary | ICD-10-CM | POA: Diagnosis not present

## 2013-09-24 DIAGNOSIS — D239 Other benign neoplasm of skin, unspecified: Secondary | ICD-10-CM | POA: Diagnosis not present

## 2013-09-24 DIAGNOSIS — L819 Disorder of pigmentation, unspecified: Secondary | ICD-10-CM | POA: Diagnosis not present

## 2013-09-28 ENCOUNTER — Ambulatory Visit (INDEPENDENT_AMBULATORY_CARE_PROVIDER_SITE_OTHER): Payer: Medicare Other | Admitting: Podiatry

## 2013-09-28 ENCOUNTER — Ambulatory Visit (INDEPENDENT_AMBULATORY_CARE_PROVIDER_SITE_OTHER): Payer: Medicare Other

## 2013-09-28 ENCOUNTER — Encounter: Payer: Self-pay | Admitting: Podiatry

## 2013-09-28 VITALS — BP 129/74 | HR 75 | Resp 16 | Ht 61.5 in | Wt 127.0 lb

## 2013-09-28 DIAGNOSIS — M2040 Other hammer toe(s) (acquired), unspecified foot: Secondary | ICD-10-CM

## 2013-09-28 DIAGNOSIS — M2010 Hallux valgus (acquired), unspecified foot: Secondary | ICD-10-CM

## 2013-09-28 DIAGNOSIS — M2041 Other hammer toe(s) (acquired), right foot: Secondary | ICD-10-CM

## 2013-09-28 NOTE — Progress Notes (Signed)
   Subjective:    Patient ID: Debra Ruiz, female    DOB: [birth year], 68 y.o.   MRN: [id number]  HPI Comments: This toe it sticks up. i don't want surgery , just would like to know what to do to prevent it from getting worse      Review of Systems  All other systems reviewed and are negative.       Objective:   Physical Exam I have reviewed her past history medications allergies surgeries social history and review of systems. Pulses are strongly palpable bilateral. Neurologic sensorium is intact bilateral. Muscle strength is 5 over 5 dorsiflexors plantar flexors inverters everters all of his musculature is intact. Orthopedic evaluation demonstrates hallux abductovalgus deformity with hammertoe deformity second metatarsophalangeal joint right foot. Taylor's bunion deformity also noted bilateral. Right greater than left.        Assessment & Plan:  Assessment: Hallux abductovalgus deformity. Hammertoe deformity second digit bilateral. Taylor's bunion deformity bilateral.

## 2013-09-30 ENCOUNTER — Other Ambulatory Visit: Payer: Self-pay | Admitting: Internal Medicine

## 2013-10-05 DIAGNOSIS — H01009 Unspecified blepharitis unspecified eye, unspecified eyelid: Secondary | ICD-10-CM | POA: Diagnosis not present

## 2013-10-05 DIAGNOSIS — H259 Unspecified age-related cataract: Secondary | ICD-10-CM | POA: Diagnosis not present

## 2013-10-05 DIAGNOSIS — H04129 Dry eye syndrome of unspecified lacrimal gland: Secondary | ICD-10-CM | POA: Diagnosis not present

## 2013-10-05 DIAGNOSIS — H179 Unspecified corneal scar and opacity: Secondary | ICD-10-CM | POA: Diagnosis not present

## 2013-11-18 ENCOUNTER — Other Ambulatory Visit: Payer: Medicare Other | Admitting: Internal Medicine

## 2013-11-18 DIAGNOSIS — Z1329 Encounter for screening for other suspected endocrine disorder: Secondary | ICD-10-CM | POA: Diagnosis not present

## 2013-11-18 DIAGNOSIS — Z130 Encounter for screening for diseases of the blood and blood-forming organs and certain disorders involving the immune mechanism: Secondary | ICD-10-CM | POA: Diagnosis not present

## 2013-11-18 DIAGNOSIS — Z79899 Other long term (current) drug therapy: Secondary | ICD-10-CM | POA: Diagnosis not present

## 2013-11-18 DIAGNOSIS — E785 Hyperlipidemia, unspecified: Secondary | ICD-10-CM

## 2013-11-18 DIAGNOSIS — I10 Essential (primary) hypertension: Secondary | ICD-10-CM | POA: Diagnosis not present

## 2013-11-18 LAB — CBC WITH DIFFERENTIAL/PLATELET
Basophils Absolute: 0 10*3/uL (ref 0.0–0.1)
Basophils Relative: 1 % (ref 0–1)
EOS PCT: 2 % (ref 0–5)
Eosinophils Absolute: 0.1 10*3/uL (ref 0.0–0.7)
HEMATOCRIT: 41.2 % (ref 36.0–46.0)
Hemoglobin: 14.5 g/dL (ref 12.0–15.0)
LYMPHS ABS: 1.9 10*3/uL (ref 0.7–4.0)
LYMPHS PCT: 44 % (ref 12–46)
MCH: 32.4 pg (ref 26.0–34.0)
MCHC: 35.2 g/dL (ref 30.0–36.0)
MCV: 92 fL (ref 78.0–100.0)
Monocytes Absolute: 0.6 10*3/uL (ref 0.1–1.0)
Monocytes Relative: 13 % — ABNORMAL HIGH (ref 3–12)
Neutro Abs: 1.8 10*3/uL (ref 1.7–7.7)
Neutrophils Relative %: 40 % — ABNORMAL LOW (ref 43–77)
PLATELETS: 248 10*3/uL (ref 150–400)
RBC: 4.48 MIL/uL (ref 3.87–5.11)
RDW: 13.5 % (ref 11.5–15.5)
WBC: 4.4 10*3/uL (ref 4.0–10.5)

## 2013-11-18 LAB — LIPID PANEL
CHOLESTEROL: 172 mg/dL (ref 0–200)
HDL: 62 mg/dL (ref >39)
LDL Cholesterol: 94 mg/dL (ref 0–99)
Total CHOL/HDL Ratio: 2.8 Ratio
Triglycerides: 78 mg/dL (ref <150)
VLDL: 16 mg/dL (ref 0–40)

## 2013-11-18 LAB — COMPREHENSIVE METABOLIC PANEL
ALBUMIN: 4.2 g/dL (ref 3.5–5.2)
ALT: 19 U/L (ref 0–35)
AST: 21 U/L (ref 0–37)
Alkaline Phosphatase: 72 U/L (ref 39–117)
BUN: 17 mg/dL (ref 6–23)
CO2: 31 meq/L (ref 19–32)
CREATININE: 0.65 mg/dL (ref 0.50–1.10)
Calcium: 9 mg/dL (ref 8.4–10.5)
Chloride: 100 mEq/L (ref 96–112)
Glucose, Bld: 99 mg/dL (ref 70–99)
Potassium: 4 mEq/L (ref 3.5–5.3)
Sodium: 138 mEq/L (ref 135–145)
Total Bilirubin: 0.5 mg/dL (ref 0.2–1.2)
Total Protein: 7.8 g/dL (ref 6.0–8.3)

## 2013-11-18 LAB — TSH: TSH: 2.043 u[IU]/mL (ref 0.350–4.500)

## 2013-11-19 ENCOUNTER — Encounter: Payer: Self-pay | Admitting: Internal Medicine

## 2013-11-19 ENCOUNTER — Other Ambulatory Visit (HOSPITAL_COMMUNITY)
Admission: RE | Admit: 2013-11-19 | Discharge: 2013-11-19 | Disposition: A | Discharge status: Home / Self Care | Payer: Medicare Other | Source: Ambulatory Visit | Attending: Internal Medicine | Admitting: Internal Medicine

## 2013-11-19 ENCOUNTER — Ambulatory Visit (INDEPENDENT_AMBULATORY_CARE_PROVIDER_SITE_OTHER): Payer: Medicare Other | Admitting: Internal Medicine

## 2013-11-19 VITALS — BP 136/70 | HR 76 | Ht 60.75 in | Wt 130.5 lb

## 2013-11-19 DIAGNOSIS — E785 Hyperlipidemia, unspecified: Secondary | ICD-10-CM | POA: Diagnosis not present

## 2013-11-19 DIAGNOSIS — Z8739 Personal history of other diseases of the musculoskeletal system and connective tissue: Secondary | ICD-10-CM | POA: Diagnosis not present

## 2013-11-19 DIAGNOSIS — F341 Dysthymic disorder: Secondary | ICD-10-CM | POA: Diagnosis not present

## 2013-11-19 DIAGNOSIS — K219 Gastro-esophageal reflux disease without esophagitis: Secondary | ICD-10-CM

## 2013-11-19 DIAGNOSIS — Z0000 Encounter for general adult medical examination without abnormal findings: Secondary | ICD-10-CM

## 2013-11-19 DIAGNOSIS — F32A Depression, unspecified: Secondary | ICD-10-CM

## 2013-11-19 DIAGNOSIS — Z124 Encounter for screening for malignant neoplasm of cervix: Secondary | ICD-10-CM | POA: Diagnosis not present

## 2013-11-19 DIAGNOSIS — F329 Major depressive disorder, single episode, unspecified: Secondary | ICD-10-CM

## 2013-11-19 DIAGNOSIS — H04129 Dry eye syndrome of unspecified lacrimal gland: Secondary | ICD-10-CM

## 2013-11-19 DIAGNOSIS — F419 Anxiety disorder, unspecified: Secondary | ICD-10-CM

## 2013-11-19 LAB — POCT URINALYSIS DIPSTICK
BILIRUBIN UA: NEGATIVE
Blood, UA: NEGATIVE
Glucose, UA: NEGATIVE
Ketones, UA: NEGATIVE
LEUKOCYTES UA: NEGATIVE
NITRITE UA: NEGATIVE
Protein, UA: NEGATIVE
Spec Grav, UA: 1.01
Urobilinogen, UA: NEGATIVE
pH, UA: 5.5

## 2013-11-19 NOTE — Progress Notes (Signed)
Subjective:    Patient ID: Debra Ruiz, female    DOB: 09-Jun-1946, 68 y.o.   MRN: [id number]  HPI 68 year old female for health maintenance and evaluation of medical issues. History of hyperlipidemia, anxiety depression which is stable, hypertension, GE reflux, insomnia.  History of issues with her back. In 2011 was treated by Dr. Melba Coon with a 12 day prednisone dosepak. She subsequently saw Dr. Ellene Route for evaluation and improved without having epidural steroid injections. MRI of LS-spine in may 2011 showed a left foraminal narrowing due to disc and facet arthropathy. She has spondylosis at L3-L4 where there was a moderately severe central canal stenosis and advanced facet arthropathy resulting in anterolisthesis. Also had markedly left foraminal narrowing at L4-L5 due to disc disease and facet arthropathy. No complaint of back pain today.  Had normal bone density study done in 2011. Had Zostavax vaccine in February 2008 at health Department.  History of dry eyes syndrome treated by ophthalmologist.  Had sigmoidoscopy by Dr. Karn Cassis around the year 2000. Is never had a colonoscopy and refuses.  A Cardiolite study was done in 1999 which was negative for ischemia done by Dr. Wynonia Lawman.  Patient unable to tolerate Crestor according to old records when she was under the care of Dr. Sharene Butters has apparently liver functions increased.  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  History of urge urinary incontinence.  No history of operations, fractures, illnesses requiring hospitalization. Did have tubal ligation in 1977.  Family history: Father died at age 47 of an MI with history of hypertension. Mother died at age 74 of a stroke with history of hypertension. Brother with history of brain tumor and cardiac defibrillator. Has a twin sister who lives in [place]. Sister with history of hypertension and hyperlipidemia. There is a strong family history of Alzheimer's disease  in the women.  Social history: She is currently a nonsmoker but used to smoke years ago. Drinks alcohol several times a week. She is married. History of chronic dysthymia and has a fear of getting Alzheimer's disease because of family history. She grew up in [place] and came to [place] work for an [employer]. She's been married 3 times. No children.  Patient raise concerned about her memory and 2013 and was sent to [facility] neuropsychiatry to be tested for possible memory disorder. She had good verbal memory but some impairment with visual memory. She noted improvement with depression being changed from Prozac to [place]. It was recommended she be retested in one year but she hasn't really wanted to pursue that. Seems less concerned about dementia at this point in time.    Review of Systems  Constitutional: Negative.   HENT: Negative.   Eyes:       Eye rosea and chronic dry eye from RK surgery 20 years ago  Respiratory: Negative.   Cardiovascular: Negative.   Gastrointestinal:       No constipation  Endocrine: Negative.   Genitourinary: Negative.   Allergic/Immunologic: Negative.   Neurological: Negative.        Still worried about memory has had testing- worried about family history of dementia in females  Hematological: Negative.        Objective:   Physical Exam  Vitals reviewed. Constitutional: She is oriented to person, place, and time. She appears well-developed and well-nourished. No distress.  HENT:  Head: Normocephalic and atraumatic.  Right Ear: External ear normal.  Left Ear: External ear normal.  Mouth/Throat: Oropharynx is clear and moist. No oropharyngeal exudate.  Eyes: Conjunctivae are normal. Pupils are equal, round, and reactive to light. Right eye exhibits no discharge. Left eye exhibits no discharge. No scleral icterus.  Neck: Neck supple. No JVD present. No thyromegaly present.  Cardiovascular: Normal rate, regular  rhythm, normal heart sounds and intact distal pulses.   No murmur heard. Pulmonary/Chest: Effort normal and breath sounds normal. No respiratory distress. She has no wheezes. She has no rales. She exhibits no tenderness.  Breasts normal female  Abdominal: Soft. Bowel sounds are normal. She exhibits no distension and no mass. There is no tenderness. There is no rebound and no guarding.  Genitourinary:  Pap taken. Bimanual normal.  Musculoskeletal: Normal range of motion. She exhibits no edema.  Lymphadenopathy:    She has no cervical adenopathy.  Neurological: She is alert and oriented to person, place, and time. She has normal reflexes. She displays normal reflexes. No cranial nerve deficit. Coordination normal.  Skin: Skin is warm and dry. No rash noted. She is not diaphoretic.  Psychiatric: She has a normal mood and affect. Her behavior is normal. Judgment and thought content normal.          Assessment & Plan:  Hyperlipidemia-stable on current regimen  History of GE reflux-stable  History of dry eyes syndrome  History of low back pain  History of anxiety depression  Plan: Return in 6 months for lipid panel liver functions and office visit. Continue same medications. Given 3 Hemoccult cards a she declines colonoscopy. Last mammogram and bone density study.     Subjective:   Patient presents for Medicare Annual/Subsequent preventive examination.  Review Past Medical/Family/Social: see above   Risk Factors  Current exercise habits: walking several times a week Dietary issues discussed: low fat low carb  Cardiac risk factors: hyperlipidemia  Depression Screen  (Note: if answer to either of the following is "Yes", a more complete depression screening is indicated)   Over the past two weeks, have you felt down, depressed or hopeless? No  Over the past two weeks, have you felt little interest or pleasure in doing things? No Have you lost interest or pleasure in daily  life? No Do you often feel hopeless? No Do you cry easily over simple problems? No   Activities of Daily Living  In your present state of health, do you have any difficulty performing the following activities?:   Driving? No  Managing money? No  Feeding yourself? No  Getting from bed to chair? No  Climbing a flight of stairs? No  Preparing food and eating?: No  Bathing or showering? No  Getting dressed: No  Getting to the toilet? No  Using the toilet:No  Moving around from place to place: No  In the past year have you fallen or had a near fall?:No  Are you sexually active? No  Do you have more than one partner? No   Hearing Difficulties: No  Do you often ask people to speak up or repeat themselves? No  Do you experience ringing or noises in your ears? No  Do you have difficulty understanding soft or whispered voices? No  Do you feel that you have a problem with memory? sometimes Do you often misplace items? No    Home Safety:  Do you have a smoke alarm at your residence? Yes Do you have grab bars in the bathroom? no Do you have throw rugs in your house? no   Cognitive Testing  Alert?  Yes Normal Appearance?Yes  Oriented to person? Yes Place? Yes  Time? Yes  Recall of three objects? Yes  Can perform simple calculations? Yes  Displays appropriate judgment?Yes  Can read the correct time from a watch face?Yes   List the Names of Other Physician/Practitioners you currently use:  See referral list for the physicians patient is currently seeing. Dr. Erasmo Leventhal rosea    Review of Systems: see above   Objective:     General appearance: Appears stated age  Head: Normocephalic, without obvious abnormality, atraumatic  Eyes: conj clear, EOMi PEERLA  Ears: normal TM's and external ear canals both ears  Nose: Nares normal. Septum midline. Mucosa normal. No drainage or sinus tenderness.  Throat: lips, mucosa, and tongue normal; teeth and gums normal  Neck: no  adenopathy, no carotid bruit, no JVD, supple, symmetrical, trachea midline and thyroid not enlarged, symmetric, no tenderness/mass/nodules  No CVA tenderness.  Lungs: clear to auscultation bilaterally  Breasts: normal appearance, no masses or tenderness Heart: regular rate and rhythm, S1, S2 normal, no murmur, click, rub or gallop  Abdomen: soft, non-tender; bowel sounds normal; no masses, no organomegaly  Musculoskeletal: ROM normal in all joints, no crepitus, no deformity, Normal muscle strengthen. Back  is symmetric, no curvature. Skin: Skin color, texture, turgor normal. No rashes or lesions  Lymph nodes: Cervical, supraclavicular, and axillary nodes normal.  Neurologic: CN 2 -12 Normal, Normal symmetric reflexes. Normal coordination and gait  Psych: Alert & Oriented x 3, Mood appear stable.    Assessment:    Annual wellness medicare exam   Plan:    During the course of the visit the patient was educated and counseled about appropriate screening and preventive services including:   Declines colonoscopy. Given 3 Hemoccult cards.  Order given to have mammogram and bone density study     Patient Instructions (the written plan) was given to the patient.  Medicare Attestation  I have personally reviewed:  The patient's medical and social history  Their use of alcohol, tobacco or illicit drugs  Their current medications and supplements  The patient's functional ability including ADLs,fall risks, home safety risks, cognitive, and hearing and visual impairment  Diet and physical activities  Evidence for depression or mood disorders  The patient's weight, height, BMI, and visual acuity have been recorded in the chart. I have made referrals, counseling, and provided education to the patient based on review of the above and I have provided the patient with a written personalized care plan for preventive services.

## 2013-11-20 NOTE — Patient Instructions (Signed)
Continue same medications and return in 6 months 

## 2013-11-29 DIAGNOSIS — Z780 Asymptomatic menopausal state: Secondary | ICD-10-CM | POA: Diagnosis not present

## 2013-11-29 DIAGNOSIS — Z1231 Encounter for screening mammogram for malignant neoplasm of breast: Secondary | ICD-10-CM | POA: Diagnosis not present

## 2013-12-30 ENCOUNTER — Other Ambulatory Visit: Payer: Self-pay | Admitting: Internal Medicine

## 2014-02-20 ENCOUNTER — Other Ambulatory Visit: Payer: Self-pay | Admitting: Internal Medicine

## 2014-02-23 ENCOUNTER — Other Ambulatory Visit: Payer: Self-pay | Admitting: Internal Medicine

## 2014-04-27 DIAGNOSIS — H16103 Unspecified superficial keratitis, bilateral: Secondary | ICD-10-CM | POA: Diagnosis not present

## 2014-04-27 DIAGNOSIS — H04123 Dry eye syndrome of bilateral lacrimal glands: Secondary | ICD-10-CM | POA: Diagnosis not present

## 2014-04-27 DIAGNOSIS — H2513 Age-related nuclear cataract, bilateral: Secondary | ICD-10-CM | POA: Diagnosis not present

## 2014-05-03 ENCOUNTER — Encounter: Payer: Self-pay | Admitting: Internal Medicine

## 2014-05-03 ENCOUNTER — Ambulatory Visit (INDEPENDENT_AMBULATORY_CARE_PROVIDER_SITE_OTHER): Payer: Medicare Other | Admitting: Internal Medicine

## 2014-05-03 VITALS — BP 128/72 | HR 72 | Temp 98.4°F | Resp 14 | Ht 61.0 in | Wt 130.0 lb

## 2014-05-03 DIAGNOSIS — L03011 Cellulitis of right finger: Secondary | ICD-10-CM

## 2014-05-03 MED ORDER — LEVOFLOXACIN 500 MG PO TABS: 500.0000 mg | ORAL_TABLET | Freq: Every day | ORAL | Status: DC

## 2014-05-03 NOTE — Patient Instructions (Signed)
Take Levaquin 500 milligrams daily for 7 days. Soak anger and warm soapy water for 20 minutes daily. Call if not better in 7 days or sooner if worse.

## 2014-05-03 NOTE — Progress Notes (Signed)
   Subjective:    Patient ID: Debra Ruiz, female    DOB: 1946/03/05, 68 y.o.   MRN: [id number]  HPI Patient presents today with a 3 week history of redness and soreness right finger. She's been working out in the yard a lot. Thought perhaps an insect bit her. Says it's sore to touch. Doesn't recall a bite. Says it's somewhat itchy. She went to the pharmacy and they recommended a physician visit.    Review of Systems     Objective:   Physical Exam She has puffiness and erythema medial aspect PIP joint right fourth finger. Good range of motion in that joint.       Assessment & Plan:  Cellulitis right fourth finger  Plan: Levaquin 500 milligrams daily for 7 days. Do not know if this is an insect bite or not. Do not see evidence of bite but has been 3 weeks. Tetanus immunization is up-to-date. She will soak finger in warm soapy water for 20 minutes daily. Call if not better in 7 days or sooner if forced.

## 2014-05-11 DIAGNOSIS — H04121 Dry eye syndrome of right lacrimal gland: Secondary | ICD-10-CM | POA: Diagnosis not present

## 2014-05-11 DIAGNOSIS — H04122 Dry eye syndrome of left lacrimal gland: Secondary | ICD-10-CM | POA: Diagnosis not present

## 2014-05-15 ENCOUNTER — Other Ambulatory Visit: Payer: Self-pay | Admitting: Internal Medicine

## 2014-06-02 ENCOUNTER — Other Ambulatory Visit: Payer: Medicare Other | Admitting: Internal Medicine

## 2014-06-02 DIAGNOSIS — I10 Essential (primary) hypertension: Secondary | ICD-10-CM

## 2014-06-02 DIAGNOSIS — E785 Hyperlipidemia, unspecified: Secondary | ICD-10-CM

## 2014-06-02 LAB — LIPID PANEL
CHOLESTEROL: 176 mg/dL (ref 0–200)
HDL: 70 mg/dL (ref >39)
LDL Cholesterol: 89 mg/dL (ref 0–99)
Total CHOL/HDL Ratio: 2.5 Ratio
Triglycerides: 85 mg/dL (ref <150)
VLDL: 17 mg/dL (ref 0–40)

## 2014-06-02 LAB — HEPATIC FUNCTION PANEL
ALK PHOS: 74 U/L (ref 39–117)
ALT: 19 U/L (ref 0–35)
AST: 20 U/L (ref 0–37)
Albumin: 4.3 g/dL (ref 3.5–5.2)
BILIRUBIN DIRECT: 0.1 mg/dL (ref 0.0–0.3)
Indirect Bilirubin: 0.5 mg/dL (ref 0.2–1.2)
Total Bilirubin: 0.6 mg/dL (ref 0.2–1.2)
Total Protein: 8 g/dL (ref 6.0–8.3)

## 2014-06-03 ENCOUNTER — Ambulatory Visit (INDEPENDENT_AMBULATORY_CARE_PROVIDER_SITE_OTHER): Payer: Medicare Other | Admitting: Internal Medicine

## 2014-06-03 ENCOUNTER — Encounter: Payer: Self-pay | Admitting: Internal Medicine

## 2014-06-03 VITALS — BP 120/70 | HR 81 | Temp 98.5°F | Wt 131.0 lb

## 2014-06-03 DIAGNOSIS — I10 Essential (primary) hypertension: Secondary | ICD-10-CM

## 2014-06-03 NOTE — Progress Notes (Signed)
   Subjective:    Patient ID: Debra Ruiz, female    DOB: 04-08-1946, 68 y.o.   MRN: [id number]  HPI  For 6 month recheck on HTN and Hyperlipidemia. History of anxiety and depression. History of GE reflux. Patient feels well today with no new complaints.    Review of Systems     Objective:   Physical Exam  Neck is supple without JVD thyromegaly or carotid bruits. Chest clear to auscultation. Cardiac exam regular rate and rhythm. Extremities without edema. Lipid panel and liver functions are entirely within normal limits on statin therapy      Assessment & Plan:  Hypertension-stable on 3 drug regimen diuretic, Bystolic, amlodipine  Hyperlipidemia-on statin therapy. Lipid panel liver functions entirely within normal limits  Anxiety depression-stable on Lexapro  Plan: Continue same medications and return in 6 months for physical examination.

## 2014-06-12 ENCOUNTER — Other Ambulatory Visit: Payer: Self-pay | Admitting: Internal Medicine

## 2014-06-15 DIAGNOSIS — L570 Actinic keratosis: Secondary | ICD-10-CM | POA: Diagnosis not present

## 2014-06-15 DIAGNOSIS — L820 Inflamed seborrheic keratosis: Secondary | ICD-10-CM | POA: Diagnosis not present

## 2014-06-15 DIAGNOSIS — L919 Hypertrophic disorder of the skin, unspecified: Secondary | ICD-10-CM | POA: Diagnosis not present

## 2014-06-24 ENCOUNTER — Other Ambulatory Visit: Payer: Self-pay | Admitting: Internal Medicine

## 2014-06-27 ENCOUNTER — Telehealth: Payer: Self-pay

## 2014-06-27 NOTE — Telephone Encounter (Signed)
Patient declines flu vaccine. 

## 2014-08-08 ENCOUNTER — Other Ambulatory Visit: Payer: Self-pay | Admitting: *Deleted

## 2014-08-08 MED ORDER — AMLODIPINE BESYLATE 5 MG PO TABS: 5.0000 mg | ORAL_TABLET | Freq: Every day | ORAL | Status: DC

## 2014-08-08 MED ORDER — HYDROCHLOROTHIAZIDE 25 MG PO TABS: 25.0000 mg | ORAL_TABLET | Freq: Every day | ORAL | Status: DC

## 2014-08-08 MED ORDER — ATORVASTATIN CALCIUM 20 MG PO TABS: ORAL_TABLET | ORAL | Status: DC

## 2014-08-08 MED ORDER — POTASSIUM CHLORIDE CRYS ER 20 MEQ PO TBCR: 20.0000 meq | EXTENDED_RELEASE_TABLET | Freq: Every day | ORAL | Status: DC

## 2014-08-08 MED ORDER — ESCITALOPRAM OXALATE 10 MG PO TABS: ORAL_TABLET | ORAL | Status: DC

## 2014-08-08 MED ORDER — BYSTOLIC 5 MG PO TABS: 5.0000 mg | ORAL_TABLET | Freq: Every day | ORAL | Status: DC

## 2014-08-08 NOTE — Telephone Encounter (Signed)
Medication refills sent to [facility] mail order per patient request her Insurance has changed

## 2014-09-17 NOTE — Patient Instructions (Signed)
Continue same medications and return in 6 months for physical exam.

## 2014-11-17 ENCOUNTER — Other Ambulatory Visit: Payer: Self-pay | Admitting: Dermatology

## 2014-11-17 ENCOUNTER — Telehealth: Payer: Self-pay | Admitting: Internal Medicine

## 2014-11-17 NOTE — Telephone Encounter (Signed)
Patient called requesting referral to [facility] (Dr Doy Mince).  [employer] Referral completed for 6 visits, 6 months effective today.  Dx:  Dry eyes.  Josem Kaufmann #[id number].

## 2014-11-17 NOTE — Telephone Encounter (Signed)
[facility] (Dr. Mickel Baas [place]'s office) called stating patient is there in the office and they need a referral for her insurance [facility]).  Patient did not request a referral be done in advance.  Went to [employer] and put in information for 6 visits, 6 months to Dr. Ubaldo Glassing.  Auth #[id number].  Called info to [place] @ [phone], [phone].  Patient waiting in office to see Dr. Ubaldo Glassing.

## 2014-11-28 ENCOUNTER — Other Ambulatory Visit: Payer: Commercial Managed Care - HMO | Admitting: Internal Medicine

## 2014-11-28 DIAGNOSIS — R5383 Other fatigue: Secondary | ICD-10-CM

## 2014-11-28 DIAGNOSIS — Z79899 Other long term (current) drug therapy: Secondary | ICD-10-CM

## 2014-11-28 DIAGNOSIS — E785 Hyperlipidemia, unspecified: Secondary | ICD-10-CM

## 2014-11-28 LAB — CBC WITH DIFFERENTIAL/PLATELET
BASOS PCT: 0 % (ref 0–1)
Basophils Absolute: 0 10*3/uL (ref 0.0–0.1)
EOS ABS: 0.1 10*3/uL (ref 0.0–0.7)
Eosinophils Relative: 2 % (ref 0–5)
HEMATOCRIT: 44.8 % (ref 36.0–46.0)
Hemoglobin: 15.4 g/dL — ABNORMAL HIGH (ref 12.0–15.0)
Lymphocytes Relative: 47 % — ABNORMAL HIGH (ref 12–46)
Lymphs Abs: 2 10*3/uL (ref 0.7–4.0)
MCH: 32.2 pg (ref 26.0–34.0)
MCHC: 34.4 g/dL (ref 30.0–36.0)
MCV: 93.7 fL (ref 78.0–100.0)
MONO ABS: 0.5 10*3/uL (ref 0.1–1.0)
MONOS PCT: 11 % (ref 3–12)
MPV: 9 fL (ref 8.6–12.4)
Neutro Abs: 1.7 10*3/uL (ref 1.7–7.7)
Neutrophils Relative %: 40 % — ABNORMAL LOW (ref 43–77)
Platelets: 246 10*3/uL (ref 150–400)
RBC: 4.78 MIL/uL (ref 3.87–5.11)
RDW: 13.8 % (ref 11.5–15.5)
WBC: 4.2 10*3/uL (ref 4.0–10.5)

## 2014-11-28 LAB — COMPLETE METABOLIC PANEL WITH GFR
ALBUMIN: 4.3 g/dL (ref 3.5–5.2)
ALT: 18 U/L (ref 0–35)
AST: 21 U/L (ref 0–37)
Alkaline Phosphatase: 75 U/L (ref 39–117)
BUN: 16 mg/dL (ref 6–23)
CHLORIDE: 100 meq/L (ref 96–112)
CO2: 29 meq/L (ref 19–32)
Calcium: 9.8 mg/dL (ref 8.4–10.5)
Creat: 0.73 mg/dL (ref 0.50–1.10)
GFR, Est African American: >89 mL/min
GFR, Est Non African American: 85 mL/min
Glucose, Bld: 97 mg/dL (ref 70–99)
Potassium: 4.1 mEq/L (ref 3.5–5.3)
SODIUM: 138 meq/L (ref 135–145)
Total Bilirubin: 0.6 mg/dL (ref 0.2–1.2)
Total Protein: 8.1 g/dL (ref 6.0–8.3)

## 2014-11-28 LAB — LIPID PANEL
CHOL/HDL RATIO: 2.8 ratio
Cholesterol: 188 mg/dL (ref 0–200)
HDL: 68 mg/dL (ref >=46)
LDL Cholesterol: 100 mg/dL — ABNORMAL HIGH (ref 0–99)
Triglycerides: 100 mg/dL (ref <150)
VLDL: 20 mg/dL (ref 0–40)

## 2014-11-28 LAB — TSH: TSH: 2.87 u[IU]/mL (ref 0.350–4.500)

## 2014-11-29 ENCOUNTER — Encounter: Payer: Self-pay | Admitting: Internal Medicine

## 2014-11-29 ENCOUNTER — Ambulatory Visit (INDEPENDENT_AMBULATORY_CARE_PROVIDER_SITE_OTHER): Payer: Commercial Managed Care - HMO | Admitting: Internal Medicine

## 2014-11-29 VITALS — BP 132/74 | HR 80 | Temp 97.9°F | Ht 61.0 in | Wt 125.0 lb

## 2014-11-29 DIAGNOSIS — F411 Generalized anxiety disorder: Secondary | ICD-10-CM | POA: Diagnosis not present

## 2014-11-29 DIAGNOSIS — K219 Gastro-esophageal reflux disease without esophagitis: Secondary | ICD-10-CM | POA: Diagnosis not present

## 2014-11-29 DIAGNOSIS — F32A Depression, unspecified: Secondary | ICD-10-CM

## 2014-11-29 DIAGNOSIS — Z0000 Encounter for general adult medical examination without abnormal findings: Secondary | ICD-10-CM

## 2014-11-29 DIAGNOSIS — M47816 Spondylosis without myelopathy or radiculopathy, lumbar region: Secondary | ICD-10-CM

## 2014-11-29 DIAGNOSIS — F329 Major depressive disorder, single episode, unspecified: Secondary | ICD-10-CM

## 2014-11-29 DIAGNOSIS — E785 Hyperlipidemia, unspecified: Secondary | ICD-10-CM | POA: Diagnosis not present

## 2014-11-29 DIAGNOSIS — H04129 Dry eye syndrome of unspecified lacrimal gland: Secondary | ICD-10-CM | POA: Diagnosis not present

## 2014-11-29 DIAGNOSIS — I10 Essential (primary) hypertension: Secondary | ICD-10-CM | POA: Diagnosis not present

## 2014-11-29 LAB — POCT URINALYSIS DIPSTICK
Bilirubin, UA: NEGATIVE
Blood, UA: NEGATIVE
Glucose, UA: NEGATIVE
KETONES UA: NEGATIVE
LEUKOCYTES UA: NEGATIVE
Nitrite, UA: NEGATIVE
Protein, UA: NEGATIVE
Urobilinogen, UA: NEGATIVE
pH, UA: 7

## 2014-11-29 LAB — VITAMIN D 25 HYDROXY (VIT D DEFICIENCY, FRACTURES): VIT D 25 HYDROXY: 39 ng/mL (ref 30–100)

## 2014-11-29 NOTE — Progress Notes (Signed)
Subjective:    Patient ID: Debra Ruiz, female    DOB: 13-May-1946, 69 y.o.   MRN: [id number]  HPI 69 year old white female in today for health maintenance exam and evaluation of medical problems. History of hyperlipidemia, anxiety, depression, hypertension, GE reflux, insomnia.  History of issues with her back in 2011 was treated by Dr. Fabienne Bruns free with a 12 day course of prednisone. Subsequently she saw Dr. Ellene Route for evaluation improved without having epidural steroidal injections. MRI of the LS spine in May 2011 showed a left foraminal airway due to disc and facet arthropathy. She has spondylosis L3-L4 where there was a moderately severe central canal stenosis and advanced facet arthropathy resulting in anterolisthesis. Marketed left foraminal nearing at L4-L5 due to disc disease and facet arthropathy.  Not having back pain recently.  Normal bone density study in 2011.  Zostavax vaccine February 2008 health Department.  History of dry syndrome and rosacea treated by ophthalmologist.  Sigmoidoscopy by Dr. Cristina Gong around the year 2000. Has never had colonoscopy. Agrees to do 3 heme occult cards.  Cardiolite study done in 1999 was negative for ischemia done by Dr. Wynonia Lawman.  Patient unable to tolerate Crestor according to old records when she was under the care of Dr. Koleen Nimrod. Apparently history of increased liver functions on Crestor.  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  History of urge urinary incontinence. No history of serious illnesses or fractures. Did have tubal ligation in 1977.  Social history: Currently a nonsmoker but used to smoke years ago. Drinks alcohol several times a week. She is married. History of chronic dysthymia. Has a fear of getting Alzheimer's disease because of family history. She [place] and came to [place] to work for an [employer]. She's been married 3 times. No children.  Patient raised concerns  about her memory in 2013 and was sent to [facility] neuropsychiatry to be tested for possible memory disorder. She had good verbal memory but some impairment with visual memory. She noted improvement with depression being changed from Prozac to [place]. It was recommended she be retested in one year but she hasn't really wanted to do that. Seems less concerned about dementia.  Family history: Father died at a 23 of an MI with history of hypertension. Mother died at age 86 of a stroke with history of hypertension. Brother with history of brain tumor and cardiac defibrillator. Has a twin sister who lives in [place]. Sister with history of hypertension and hyperlipidemia. There is a strong family history of Alzheimer's disease in women in her family.    Review of Systems  Constitutional: Negative.   HENT:       Patient says that she has chronic dry eyes from radial keratotomy surgery some 20 years ago.  All other systems reviewed and are negative.      Objective:   Physical Exam  Constitutional: She is oriented to person, place, and time. She appears well-developed and well-nourished. No distress.  HENT:  Head: Normocephalic and atraumatic.  Right Ear: External ear normal.  Left Ear: External ear normal.  Mouth/Throat: Oropharynx is clear and moist.  Eyes: Conjunctivae and EOM are normal. Pupils are equal, round, and reactive to light. Right eye exhibits no discharge. Left eye exhibits no discharge. No scleral icterus.  Neck: Neck supple. No JVD present. No thyromegaly present.  Cardiovascular: Normal rate, normal heart sounds and intact distal pulses.   No murmur heard. Pulmonary/Chest: Effort normal and  breath sounds normal. No respiratory distress. She has no wheezes. She has no rales.  Abdominal: Soft. Bowel sounds are normal. She exhibits no distension and no mass. There is no tenderness. There is no rebound and no guarding.  Genitourinary:  Bimanual normal. Pap done in 2015. Does  not need to be repeated due to age recommendations  Musculoskeletal: She exhibits no edema.  Lymphadenopathy:    She has no cervical adenopathy.  Neurological: She is alert and oriented to person, place, and time. She has normal reflexes. No cranial nerve deficit. Coordination normal.  Skin: Skin is warm and dry. No rash noted. She is not diaphoretic.  Psychiatric: She has a normal mood and affect. Her behavior is normal. Judgment and thought content normal.  Vitals reviewed.         Assessment & Plan:   Normal health maintenance exam  Hyperlipidemia-stable on low-dose statin  Anxiety depression-stable  History of low back pain due to lumbar spondylosis  Hypertension-stable medication  History of dry eye syndrome  GE reflux-stable  Plan: Declines colonoscopy. Agrees to do hemoccult cards or Cologuard. Annual mammography recommended. Return in 6-12 months or as needed.     Subjective:   Patient presents for Medicare Annual/Subsequent preventive examination.  Review Past Medical/Family/Social:see above   Risk Factors  Current exercise habits: walks Dietary issues discussed: Low fat low carb  Cardiac risk factors: Hyperlipidemia, hypertension, family history  Depression Screen  (Note: if answer to either of the following is "Yes", a more complete depression screening is indicated)   Over the past two weeks, have you felt down, depressed or hopeless? No  Over the past two weeks, have you felt little interest or pleasure in doing things? No Have you lost interest or pleasure in daily life? No Do you often feel hopeless? No Do you cry easily over simple problems? No   Activities of Daily Living  In your present state of health, do you have any difficulty performing the following activities?:   Driving? No  Managing money? No  Feeding yourself? No  Getting from bed to chair? No  Climbing a flight of stairs? No  Preparing food and eating?: No  Bathing or  showering? No  Getting dressed: No  Getting to the toilet? No  Using the toilet:No  Moving around from place to place: No  In the past year have you fallen or had a near fall?:No  Are you sexually active? No  Do you have more than one partner? No   Hearing Difficulties: No  Do you often ask people to speak up or repeat themselves? No  Do you experience ringing or noises in your ears? No  Do you have difficulty understanding soft or whispered voices? No  Do you feel that you have a problem with memory? No Do you often misplace items? No    Home Safety:  Do you have a smoke alarm at your residence? Yes Do you have grab bars in the bathroom?no Do you have throw rugs in your house?no   Cognitive Testing  Alert? Yes Normal Appearance?Yes  Oriented to person? Yes Place? Yes  Time? Yes  Recall of three objects? Yes  Can perform simple calculations? Yes  Displays appropriate judgment?Yes  Can read the correct time from a watch face?Yes   List the Names of Other Physician/Practitioners you currently use:  See referral list for the physicians patient is currently seeing.     Review of Systems: See above   Objective:  General appearance: Appears stated age  Head: Normocephalic, without obvious abnormality, atraumatic  Eyes: conj clear, EOMi PEERLA  Ears: normal TM's and external ear canals both ears  Nose: Nares normal. Septum midline. Mucosa normal. No drainage or sinus tenderness.  Throat: lips, mucosa, and tongue normal; teeth and gums normal  Neck: no adenopathy, no carotid bruit, no JVD, supple, symmetrical, trachea midline and thyroid not enlarged, symmetric, no tenderness/mass/nodules  No CVA tenderness.  Lungs: clear to auscultation bilaterally  Breasts: normal appearance, no masses or tenderness Heart: regular rate and rhythm, S1, S2 normal, no murmur, click, rub or gallop  Abdomen: soft, non-tender; bowel sounds normal; no masses, no organomegaly    Musculoskeletal: ROM normal in all joints, no crepitus, no deformity, Normal muscle strengthen. Back  is symmetric, no curvature. Skin: Skin color, texture, turgor normal. No rashes or lesions  Lymph nodes: Cervical, supraclavicular, and axillary nodes normal.  Neurologic: CN 2 -12 Normal, Normal symmetric reflexes. Normal coordination and gait  Psych: Alert & Oriented x 3, Mood appear stable.    Assessment:    Annual wellness medicare exam   Plan:    During the course of the visit the patient was educated and counseled about appropriate screening and preventive services including:   Annual mammogram Screening colonoscopy     Patient Instructions (the written plan) was given to the patient.  Medicare Attestation  I have personally reviewed:  The patient's medical and social history  Their use of alcohol, tobacco or illicit drugs  Their current medications and supplements  The patient's functional ability including ADLs,fall risks, home safety risks, cognitive, and hearing and visual impairment  Diet and physical activities  Evidence for depression or mood disorders  The patient's weight, height, BMI, and visual acuity have been recorded in the chart. I have made referrals, counseling, and provided education to the patient based on review of the above and I have provided the patient with a written personalized care plan for preventive services.

## 2014-12-17 NOTE — Patient Instructions (Signed)
Return in November for follow-up on anxiety, hyperlipidemia and blood pressure. Continue same medications.

## 2014-12-21 ENCOUNTER — Encounter: Payer: Self-pay | Admitting: Internal Medicine

## 2014-12-23 ENCOUNTER — Other Ambulatory Visit: Payer: Self-pay | Admitting: Internal Medicine

## 2014-12-23 NOTE — Telephone Encounter (Signed)
Refill x 6 monthd

## 2014-12-28 ENCOUNTER — Encounter: Payer: Self-pay | Admitting: Internal Medicine

## 2015-03-06 ENCOUNTER — Telehealth: Payer: Self-pay | Admitting: Internal Medicine

## 2015-03-06 MED ORDER — POTASSIUM CHLORIDE CRYS ER 20 MEQ PO TBCR: 20.0000 meq | EXTENDED_RELEASE_TABLET | Freq: Every day | ORAL | Status: DC

## 2015-03-06 MED ORDER — BYSTOLIC 5 MG PO TABS: ORAL_TABLET | ORAL | Status: DC

## 2015-03-06 NOTE — Telephone Encounter (Signed)
Please refill x 6 months 

## 2015-03-06 NOTE — Telephone Encounter (Signed)
bystolic and K+ refilled

## 2015-03-06 NOTE — Telephone Encounter (Signed)
Calling for refills through her mail order for the following:  Bystolic 5 mg qd Potassium Chloride SA 20 mEq  Pharmacy:  [facility]

## 2015-05-30 ENCOUNTER — Other Ambulatory Visit: Payer: Commercial Managed Care - HMO | Admitting: Internal Medicine

## 2015-05-30 DIAGNOSIS — Z79899 Other long term (current) drug therapy: Secondary | ICD-10-CM

## 2015-05-30 DIAGNOSIS — E785 Hyperlipidemia, unspecified: Secondary | ICD-10-CM

## 2015-05-30 DIAGNOSIS — I10 Essential (primary) hypertension: Secondary | ICD-10-CM

## 2015-05-30 LAB — LIPID PANEL
Cholesterol: 201 mg/dL — ABNORMAL HIGH (ref 125–200)
HDL: 59 mg/dL (ref >=46)
LDL CALC: 120 mg/dL (ref <130)
Total CHOL/HDL Ratio: 3.4 Ratio (ref <=5.0)
Triglycerides: 108 mg/dL (ref <150)
VLDL: 22 mg/dL (ref <30)

## 2015-05-30 LAB — HEPATIC FUNCTION PANEL
ALBUMIN: 4.3 g/dL (ref 3.6–5.1)
ALK PHOS: 79 U/L (ref 33–130)
ALT: 17 U/L (ref 6–29)
AST: 20 U/L (ref 10–35)
BILIRUBIN TOTAL: 0.6 mg/dL (ref 0.2–1.2)
Bilirubin, Direct: 0.1 mg/dL (ref <=0.2)
Indirect Bilirubin: 0.5 mg/dL (ref 0.2–1.2)
Total Protein: 7.8 g/dL (ref 6.1–8.1)

## 2015-06-01 ENCOUNTER — Encounter: Payer: Self-pay | Admitting: Internal Medicine

## 2015-06-01 ENCOUNTER — Ambulatory Visit (INDEPENDENT_AMBULATORY_CARE_PROVIDER_SITE_OTHER): Payer: Commercial Managed Care - HMO | Admitting: Internal Medicine

## 2015-06-01 VITALS — BP 126/60 | HR 80 | Temp 97.6°F | Resp 18 | Ht 61.0 in | Wt 127.0 lb

## 2015-06-01 DIAGNOSIS — M545 Low back pain, unspecified: Secondary | ICD-10-CM

## 2015-06-01 DIAGNOSIS — E785 Hyperlipidemia, unspecified: Secondary | ICD-10-CM | POA: Diagnosis not present

## 2015-06-01 DIAGNOSIS — I10 Essential (primary) hypertension: Secondary | ICD-10-CM | POA: Diagnosis not present

## 2015-06-01 DIAGNOSIS — F32A Depression, unspecified: Secondary | ICD-10-CM

## 2015-06-01 DIAGNOSIS — F329 Major depressive disorder, single episode, unspecified: Secondary | ICD-10-CM

## 2015-06-01 NOTE — Patient Instructions (Signed)
Continue diet exercise efforts. Continue same medications. It was a pleasure to see you today. Return in 6 months.

## 2015-06-01 NOTE — Progress Notes (Signed)
   Subjective:    Patient ID: Debra Ruiz, female    DOB: Mar 10, 1946, 69 y.o.   MRN: [id number]  HPI Patient in today for six-month recheck on essential hypertension, hyperlipidemia, depression. Lipid panel liver function from reviewed with her. Total cholesterol has increased from 180 02/21/2000. Needs to watch diet. She is on statin therapy. Hypertension is under excellent control on current regimen. She's been having some low back pain several days a week when she gets up in the morning. Ask her to consider if she needs a new mattress. She does get massage therapy. Has no radiculopathy.    Review of Systems     Objective:   Physical Exam Neck is supple without JVD thyromegaly or carotid bruits. Chest clear to auscultation. Cardiac exam regular rate and rhythm. Extremities without edema. Straight leg raising is negative at 90. Deep tendon reflexes 2+ and symmetrical in the lower extremities. Muscle strength in the lower extremities is normal. Affect is normal.       Assessment & Plan:  Essential hypertension-stable on current regimen   Hyperlipidemia-stable continue statin therapy and watch diet  And continue exercise efforts.  Low back pain-consider new mattress. Continue massage therapy. May take over-the-counter medications if needed for back pain such as Aleve or Tylenol  Depression-stable on antidepressant  Hyperlipidemia  Plan: Continue same medications and return in 6 months for physical exam.  With regard to immunizations. She says she had local reaction to pneumococcal 23. Therefore I am not going to give her Prevnar 13. She declines flu vaccine. She has had Zostavax vaccine in the past and her tetanus immunization is up-to-date.

## 2015-06-27 ENCOUNTER — Other Ambulatory Visit: Payer: Self-pay | Admitting: Internal Medicine

## 2015-07-29 ENCOUNTER — Other Ambulatory Visit: Payer: Self-pay | Admitting: Internal Medicine

## 2015-08-02 ENCOUNTER — Other Ambulatory Visit: Payer: Self-pay | Admitting: Internal Medicine

## 2015-08-08 ENCOUNTER — Ambulatory Visit (INDEPENDENT_AMBULATORY_CARE_PROVIDER_SITE_OTHER): Payer: Commercial Managed Care - HMO | Admitting: Internal Medicine

## 2015-08-08 ENCOUNTER — Encounter: Payer: Self-pay | Admitting: Internal Medicine

## 2015-08-08 VITALS — BP 134/70 | HR 85 | Temp 98.7°F | Resp 20 | Wt 126.0 lb

## 2015-08-08 DIAGNOSIS — Z79899 Other long term (current) drug therapy: Secondary | ICD-10-CM | POA: Diagnosis not present

## 2015-08-08 DIAGNOSIS — E785 Hyperlipidemia, unspecified: Secondary | ICD-10-CM | POA: Diagnosis not present

## 2015-08-08 DIAGNOSIS — Z8659 Personal history of other mental and behavioral disorders: Secondary | ICD-10-CM

## 2015-08-08 DIAGNOSIS — H04129 Dry eye syndrome of unspecified lacrimal gland: Secondary | ICD-10-CM | POA: Diagnosis not present

## 2015-08-08 DIAGNOSIS — M4806 Spinal stenosis, lumbar region: Secondary | ICD-10-CM

## 2015-08-08 DIAGNOSIS — I10 Essential (primary) hypertension: Secondary | ICD-10-CM

## 2015-08-08 DIAGNOSIS — Z01818 Encounter for other preprocedural examination: Secondary | ICD-10-CM | POA: Diagnosis not present

## 2015-08-08 DIAGNOSIS — M48061 Spinal stenosis, lumbar region without neurogenic claudication: Secondary | ICD-10-CM

## 2015-08-08 DIAGNOSIS — F411 Generalized anxiety disorder: Secondary | ICD-10-CM | POA: Diagnosis not present

## 2015-08-08 DIAGNOSIS — K219 Gastro-esophageal reflux disease without esophagitis: Secondary | ICD-10-CM

## 2015-08-08 DIAGNOSIS — J069 Acute upper respiratory infection, unspecified: Secondary | ICD-10-CM

## 2015-08-08 LAB — CBC WITH DIFFERENTIAL/PLATELET
BASOS PCT: 0 % (ref 0–1)
Basophils Absolute: 0 10*3/uL (ref 0.0–0.1)
EOS ABS: 0.1 10*3/uL (ref 0.0–0.7)
EOS PCT: 1 % (ref 0–5)
HCT: 40.7 % (ref 36.0–46.0)
HEMOGLOBIN: 14 g/dL (ref 12.0–15.0)
Lymphocytes Relative: 36 % (ref 12–46)
Lymphs Abs: 2 10*3/uL (ref 0.7–4.0)
MCH: 31.7 pg (ref 26.0–34.0)
MCHC: 34.4 g/dL (ref 30.0–36.0)
MCV: 92.3 fL (ref 78.0–100.0)
MONO ABS: 0.6 10*3/uL (ref 0.1–1.0)
MPV: 9 fL (ref 8.6–12.4)
Monocytes Relative: 10 % (ref 3–12)
NEUTROS ABS: 3 10*3/uL (ref 1.7–7.7)
Neutrophils Relative %: 53 % (ref 43–77)
PLATELETS: 334 10*3/uL (ref 150–400)
RBC: 4.41 MIL/uL (ref 3.87–5.11)
RDW: 13.3 % (ref 11.5–15.5)
WBC: 5.6 10*3/uL (ref 4.0–10.5)

## 2015-08-08 LAB — COMPLETE METABOLIC PANEL WITH GFR
ALBUMIN: 4.1 g/dL (ref 3.6–5.1)
ALK PHOS: 72 U/L (ref 33–130)
ALT: 19 U/L (ref 6–29)
AST: 21 U/L (ref 10–35)
BILIRUBIN TOTAL: 0.6 mg/dL (ref 0.2–1.2)
BUN: 13 mg/dL (ref 7–25)
CALCIUM: 9.3 mg/dL (ref 8.6–10.4)
CO2: 28 mmol/L (ref 20–31)
Chloride: 97 mmol/L — ABNORMAL LOW (ref 98–110)
Creat: 0.62 mg/dL (ref 0.50–0.99)
GFR, Est African American: >89 mL/min (ref >=60)
GFR, Est Non African American: >89 mL/min (ref >=60)
Glucose, Bld: 116 mg/dL — ABNORMAL HIGH (ref 65–99)
POTASSIUM: 3.8 mmol/L (ref 3.5–5.3)
Sodium: 135 mmol/L (ref 135–146)
TOTAL PROTEIN: 7.2 g/dL (ref 6.1–8.1)

## 2015-08-08 MED ORDER — BENZONATATE 200 MG PO CAPS: ORAL_CAPSULE | ORAL | Status: DC

## 2015-08-08 MED ORDER — LEVOFLOXACIN 500 MG PO TABS: 500.0000 mg | ORAL_TABLET | Freq: Every day | ORAL | Status: DC

## 2015-08-08 MED ORDER — ALPRAZOLAM 0.25 MG PO TABS: 0.2500 mg | ORAL_TABLET | Freq: Two times a day (BID) | ORAL | Status: DC | PRN

## 2015-08-08 MED ORDER — ESCITALOPRAM OXALATE 20 MG PO TABS: 20.0000 mg | ORAL_TABLET | Freq: Every day | ORAL | Status: DC

## 2015-08-08 NOTE — Progress Notes (Signed)
Subjective:    Patient ID: Debra Ruiz, female    DOB: 03/04/1946, 70 y.o.   MRN: [id number]  HPI 70 year old White Female in today for surgical clearance for rhinoplasty to be done by Dr. Debe Coder at [facility] February 28th. She is also having some other issues. She has an acute respiratory infection for the past 2 weeks. No fever or shaking chills. Has had cough that she cannot get rid off. Also complains of being impatient with more anxiety than usual. Husband retired recently and she thinks that is causing some of the anxiety and impatient feelings. She also had a argument with a close girlfriend recently as well.  Medical issues include hyperlipidemia, anxiety, depression, hypertension, GE reflux, insomnia.  History of dry eye syndrome and rosacea treated by ophthalmologist.  Sigmoidoscopy by Dr. Cristina Gong around the year 2000. Has never had colonoscopy. Has done fecal Hemoccult cards in the past.  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  Patient unable to tolerate Crestor according to all medical records. Apparently history of increased liver functions on Crestor.  Patient had Cardiolite study done in 1999 negative for scheme year by Dr. Wynonia Lawman, cardiologist.  History of urge urinary incontinence.  No history of serious illnesses or fractures.  Did have tubal ligation in 1977.  History of issues with low back pain  in 2011 treated by orthopedist with 12 day course of prednisone. Subsequently saw neurosurgeon for evaluation and improved without having epidural steroid injections. MRI of LS-spine in May 2011 spondylosis L3-L4 where there was a moderately severe central canal stenosis and advanced facet arthropathy resulting in anteriolisthesis. Marked left foraminal narrowing at L4-L5 due to disc disease and facet arthropathy.  Social history: Currently a nonsmoker but used to smoke years ago. Drinks alcohol several times a week. She is married.  History of chronic dysthymia. Has a fear of developing Alzheimer's disease because of family history. She came to [facility] to work for an [employer]. She's been married 3 times. No children.  Patient raised concerns about her memory in 2013. She was sent to [facility] in [place] to be tested for possible memory disorder. She had good verbal memory but some impairment with visual memory. She noted improvement with depression with antidepressant changed from Prozac to [place]. It was recommended she be retested in one year but she hasn't really wanted to pursue that. Seems less concerned about dementia.  Family history: Father died at age 19 of an MI with history of hypertension. Mother died at age 67 of a stroke with history of hypertension. Brother with history of brain tumor and cardiac defibrillator. Has a twin sister who lives in [place]. Sister with history of hypertension and hyperlipidemia. There is a strong family history of Alzheimer's disease in women in her family.        Review of Systems impatient,anxious, productive cough. No fever.     Objective:   Physical Exam  Constitutional: She is oriented to person, place, and time. She appears well-developed and well-nourished. No distress.  HENT:  Head: Normocephalic and atraumatic.  Right Ear: External ear normal.  Left Ear: External ear normal.  Mouth/Throat: Oropharynx is clear and moist. No oropharyngeal exudate.  Eyes: Conjunctivae and EOM are normal. Pupils are equal, round, and reactive to light. Right eye exhibits no discharge. Left eye exhibits no discharge. No scleral icterus.  Neck: Neck supple. No JVD present. No thyromegaly present.  Cardiovascular: Normal rate, regular rhythm  and normal heart sounds.   No murmur heard. Pulmonary/Chest: Effort normal and breath sounds normal. She has no wheezes. She has no rales.  Abdominal: Soft. Bowel sounds are normal. She exhibits no distension  and no mass. There is no rebound.  Genitourinary:  Deferred  Musculoskeletal: Normal range of motion. She exhibits no edema.  Lymphadenopathy:    She has no cervical adenopathy.  Neurological: She is alert and oriented to person, place, and time. She has normal reflexes. No cranial nerve deficit. Coordination normal.  Skin: Skin is warm and dry. No rash noted. She is not diaphoretic.  Psychiatric: She has a normal mood and affect. Her behavior is normal. Judgment and thought content normal.  Vitals reviewed.         Assessment & Plan:  Presurgical clearance for rhinoplasty at [facility] February 28. EKG reviewed. Lab work drawn and pending.  Acute URI  Anxiety  Plan: Levaquin 500 milligrams daily for 7 days. Tessalon Perles 200 mg 3 times daily as needed for cough. Regarding anxiety, increase Lexapro to 20 mg daily. Given prescription for Xanax 0.25 mg twice daily as needed for anxiety symptoms.

## 2015-08-09 NOTE — Patient Instructions (Addendum)
Levaquin 500 milligrams daily for 7 days. Tessalon Perles as needed for cough. Presurgical clearance granted for rhinoplasty late February. Xanax 0.25 mg twice daily as needed for anxiety. Increase Lexapro from 10-20 mg daily. EKG reviewed. Lab work reviewed.

## 2015-10-11 ENCOUNTER — Other Ambulatory Visit: Payer: Self-pay | Admitting: Internal Medicine

## 2015-11-27 ENCOUNTER — Other Ambulatory Visit: Payer: Commercial Managed Care - HMO | Admitting: Internal Medicine

## 2015-11-27 DIAGNOSIS — Z0000 Encounter for general adult medical examination without abnormal findings: Secondary | ICD-10-CM

## 2015-11-27 DIAGNOSIS — Z1329 Encounter for screening for other suspected endocrine disorder: Secondary | ICD-10-CM

## 2015-11-27 DIAGNOSIS — Z130 Encounter for screening for diseases of the blood and blood-forming organs and certain disorders involving the immune mechanism: Secondary | ICD-10-CM

## 2015-11-27 DIAGNOSIS — Z1321 Encounter for screening for nutritional disorder: Secondary | ICD-10-CM

## 2015-11-27 DIAGNOSIS — I10 Essential (primary) hypertension: Secondary | ICD-10-CM

## 2015-11-27 DIAGNOSIS — E785 Hyperlipidemia, unspecified: Secondary | ICD-10-CM

## 2015-11-27 LAB — CBC WITH DIFFERENTIAL/PLATELET
Basophils Absolute: 0 cells/uL (ref 0–200)
Basophils Relative: 0 %
Eosinophils Absolute: 82 cells/uL (ref 15–500)
Eosinophils Relative: 2 %
HCT: 44.1 % (ref 35.0–45.0)
Hemoglobin: 15 g/dL (ref 11.7–15.5)
Lymphocytes Relative: 47 %
Lymphs Abs: 1927 cells/uL (ref 850–3900)
MCH: 31.6 pg (ref 27.0–33.0)
MCHC: 34 g/dL (ref 32.0–36.0)
MCV: 92.8 fL (ref 80.0–100.0)
MPV: 9.1 fL (ref 7.5–12.5)
Monocytes Absolute: 451 cells/uL (ref 200–950)
Monocytes Relative: 11 %
Neutro Abs: 1640 cells/uL (ref 1500–7800)
Neutrophils Relative %: 40 %
Platelets: 232 10*3/uL (ref 140–400)
RBC: 4.75 MIL/uL (ref 3.80–5.10)
RDW: 13.9 % (ref 11.0–15.0)
WBC: 4.1 10*3/uL (ref 3.8–10.8)

## 2015-11-27 LAB — TSH: TSH: 3.46 mIU/L

## 2015-11-28 ENCOUNTER — Encounter: Payer: Self-pay | Admitting: Internal Medicine

## 2015-11-28 ENCOUNTER — Ambulatory Visit (INDEPENDENT_AMBULATORY_CARE_PROVIDER_SITE_OTHER): Payer: Commercial Managed Care - HMO | Admitting: Internal Medicine

## 2015-11-28 VITALS — BP 128/64 | HR 77 | Temp 97.8°F | Resp 18 | Ht 60.5 in | Wt 125.5 lb

## 2015-11-28 DIAGNOSIS — F32A Depression, unspecified: Secondary | ICD-10-CM

## 2015-11-28 DIAGNOSIS — F418 Other specified anxiety disorders: Secondary | ICD-10-CM

## 2015-11-28 DIAGNOSIS — E785 Hyperlipidemia, unspecified: Secondary | ICD-10-CM | POA: Diagnosis not present

## 2015-11-28 DIAGNOSIS — I10 Essential (primary) hypertension: Secondary | ICD-10-CM

## 2015-11-28 DIAGNOSIS — H04123 Dry eye syndrome of bilateral lacrimal glands: Secondary | ICD-10-CM

## 2015-11-28 DIAGNOSIS — K219 Gastro-esophageal reflux disease without esophagitis: Secondary | ICD-10-CM

## 2015-11-28 DIAGNOSIS — Z0000 Encounter for general adult medical examination without abnormal findings: Secondary | ICD-10-CM | POA: Diagnosis not present

## 2015-11-28 DIAGNOSIS — F329 Major depressive disorder, single episode, unspecified: Secondary | ICD-10-CM

## 2015-11-28 DIAGNOSIS — M47817 Spondylosis without myelopathy or radiculopathy, lumbosacral region: Secondary | ICD-10-CM | POA: Diagnosis not present

## 2015-11-28 DIAGNOSIS — F419 Anxiety disorder, unspecified: Secondary | ICD-10-CM

## 2015-11-28 LAB — COMPLETE METABOLIC PANEL WITH GFR
ALK PHOS: 78 U/L (ref 33–130)
ALT: 18 U/L (ref 6–29)
AST: 23 U/L (ref 10–35)
Albumin: 4.2 g/dL (ref 3.6–5.1)
BUN: 10 mg/dL (ref 7–25)
CO2: 24 mmol/L (ref 20–31)
Calcium: 9.5 mg/dL (ref 8.6–10.4)
Chloride: 101 mmol/L (ref 98–110)
Creat: 0.66 mg/dL (ref 0.50–0.99)
GFR, Est African American: >89 mL/min (ref >=60)
GLUCOSE: 95 mg/dL (ref 65–99)
POTASSIUM: 4.3 mmol/L (ref 3.5–5.3)
SODIUM: 140 mmol/L (ref 135–146)
Total Bilirubin: 0.6 mg/dL (ref 0.2–1.2)
Total Protein: 7.9 g/dL (ref 6.1–8.1)

## 2015-11-28 LAB — VITAMIN D 25 HYDROXY (VIT D DEFICIENCY, FRACTURES): VIT D 25 HYDROXY: 40 ng/mL (ref 30–100)

## 2015-11-28 LAB — LIPID PANEL
CHOL/HDL RATIO: 2.4 ratio (ref <=5.0)
Cholesterol: 170 mg/dL (ref 125–200)
HDL: 71 mg/dL (ref >=46)
LDL CALC: 84 mg/dL (ref <130)
Triglycerides: 76 mg/dL (ref <150)
VLDL: 15 mg/dL (ref <30)

## 2015-11-28 NOTE — Patient Instructions (Addendum)
To see eye doctor about acute vision change soon. Continue same medications and return in 6 months.

## 2015-11-28 NOTE — Progress Notes (Signed)
Subjective:    Patient ID: Debra Ruiz, female    DOB: [birth year], 70 y.o.   MRN: [id number]  HPI Pleasant 70 year old Female in today for health maintenance exam and evaluation of medical issues. History of hyperlipidemia, anxiety depression, hypertension, GE reflux, insomnia. History of dry eye syndrome. History of lumbar spondylosis.  She had issues with her back in 2011 and was treated by Dr. Gladstone Lighter with a 12 day course of prednisone. She subsequently saw Dr. Ellene Route for evaluation and improved without having epidural steroid injections. MRI of LS-spine in May 2011 showed a left foraminal due to disc and facet arthropathy. She has spondylosis L3-L4 where there is a moderately severe canal stenosis and advanced facet arthropathy resulting in anteriolisthesis. She has marked  left foraminal narrowing at L4-L5 due to disc disease and facet arthropathy.  Normal bone density study 2011  [facility]  History of dry eye syndrome and rosacea treated by ophthalmologist.  Sigmoidoscopy by Dr. Marco Collie around the year 2000. Has never had colonoscopy.  Cardiolite study done in 1999 was negative for skin anemia done by Dr. Wynonia Lawman.  Patient unable to tolerate Crestor according to old records when she was under the care of Dr. Koleen Nimrod. Apparently had increased liver functions on Crestor.  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  History of urge urinary incontinence. No history of serious illnesses or fractures.  Did have tubal ligation in 1977.  Social history: Currently a nonsmoker but used to smoke years ago. Drinks alcohol several times a week. She is married. History of chronic dysthymia. Has fear of getting Alzheimer's disease because of family history. She came to [facility] to work for an [employer]. She is been married 3 times. No children.  Patient raise concerns about her memory in 2013 and was sent to [facility] to be tested for possible memory disorder. She had good verbal memory but some impairment with visual memory. She noted improvement with depression being changed from Prozac to [place]. It was recommended at the time she be retested in one year but she really hasn't wanted to do that. Seems less concerned about dementia.  Family history: Father died at 76 of an MI with history of hypertension, mother died at age 51 of a stroke with history of hypertension. Brother with history of brain tumor and cardiac defibrillator. Has a twin sister who lives in [place]. Sister with history of hypertension and hyperlipidemia. There is a strong family history of Alzheimer's disease in women in her family    Review of Systems  Eyes:       Complaining of some acute vision change recently. Needs to see a physician.   Complains of chronic dry eyes from radial keratotomy surgery some 20 years ago. Otherwise review of systems negative.     Objective:   Physical Exam  Constitutional: She is oriented to person, place, and time. She appears well-developed and well-nourished.  HENT:  Head: Normocephalic and atraumatic.  Right Ear: External ear normal.  Left Ear: External ear normal.  Mouth/Throat: Oropharynx is clear and moist. No oropharyngeal exudate.  Eyes: Conjunctivae and EOM are normal. Pupils are equal, round, and reactive to light. Right eye exhibits no discharge. Left eye exhibits no discharge. No scleral icterus.  Neck: Neck supple. No JVD present. No thyromegaly present.  Cardiovascular: Normal rate, normal heart sounds and intact distal pulses.   No murmur heard. Pulmonary/Chest: Effort normal and  breath sounds normal. She has no wheezes. She has no rales.  Breasts normal female without masses  Abdominal: Soft. Bowel sounds are normal. She exhibits no distension and no mass. There is no rebound.  Genitourinary:  Bimanual normal. Pap done in 2015 and does not be repeated  due to age  Musculoskeletal: She exhibits no edema.  Lymphadenopathy:    She has no cervical adenopathy.  Neurological: She is alert and oriented to person, place, and time. She has normal reflexes. No cranial nerve deficit. Coordination normal.  Skin: Skin is warm and dry. No rash noted.  Psychiatric: She has a normal mood and affect. Her behavior is normal. Judgment and thought content normal.  Vitals reviewed.         Assessment & Plan:  Essential hypertension  Hyperlipidemia  History of depression  GE reflux  Dry eye syndrome  Anxiety  Spondylosis of lumbar region without myelopathy or radiculopathy  Plan: Return in 6-12 months or as needed. Annual mammogram recommended. Has previously declined colonoscopy. Suggest: Cologuard. Lab work reviewed with patient. It is entirely within normal limits. Medical problems are under good control on current regimen. Continue same medications.        Subjective:   Patient presents for Medicare Annual/Subsequent preventive examination.  Review Past Medical/Family/Social:See above     Risk Factors  Current exercise habits: walks Dietary issues discussed: Low fat low carb  Cardiac risk factors: Hyperlipidemia, hypertension, family history  Depression Screen  (Note: if answer to either of the following is "Yes", a more complete depression screening is indicated)   Over the past two weeks, have you felt down, depressed or hopeless? No  Over the past two weeks, have you felt little interest or pleasure in doing things? No Have you lost interest or pleasure in daily life? No Do you often feel hopeless? No Do you cry easily over simple problems? No   Activities of Daily Living  In your present state of health, do you have any difficulty performing the following activities?:   Driving? No  Managing money? No  Feeding yourself? No  Getting from bed to chair? No  Climbing a flight of stairs? No  Preparing food and  eating?: No  Bathing or showering? No  Getting dressed: No  Getting to the toilet? No  Using the toilet:No  Moving around from place to place: No  In the past year have you fallen or had a near fall?:No  Are you sexually active? No  Do you have more than one partner? No   Hearing Difficulties: No  Do you often ask people to speak up or repeat themselves? Yes Do you experience ringing or noises in your ears? No  Do you have difficulty understanding soft or whispered voices? No  Do you feel that you have a problem with memory? Sometimes Do you often misplace items? No    Home Safety:  Do you have a smoke alarm at your residence? Yes Do you have grab bars in the bathroom? No Do you have throw rugs in your house? No   Cognitive Testing  Alert? Yes Normal Appearance?Yes  Oriented to person? Yes Place? Yes  Time? Yes  Recall of three objects? Yes  Can perform simple calculations? Yes  Displays appropriate judgment?Yes  Can read the correct time from a watch face?Yes   List the Names of Other Physician/Practitioners you currently use:  See referral list for the physicians patient is currently seeing.     Review of  Systems: See above   Objective:     General appearance: Appears stated age  Head: Normocephalic, without obvious abnormality, atraumatic  Eyes: conj clear, EOMi PEERLA  Ears: normal TM's and external ear canals both ears  Nose: Nares normal. Septum midline. Mucosa normal. No drainage or sinus tenderness.  Throat: lips, mucosa, and tongue normal; teeth and gums normal  Neck: no adenopathy, no carotid bruit, no JVD, supple, symmetrical, trachea midline and thyroid not enlarged, symmetric, no tenderness/mass/nodules  No CVA tenderness.  Lungs: clear to auscultation bilaterally  Breasts: normal appearance, no masses or tenderness Heart: regular rate and rhythm, S1, S2 normal, no murmur, click, rub or gallop  Abdomen: soft, non-tender; bowel sounds normal; no  masses, no organomegaly  Musculoskeletal: ROM normal in all joints, no crepitus, no deformity, Normal muscle strengthen. Back  is symmetric, no curvature. Skin: Skin color, texture, turgor normal. No rashes or lesions  Lymph nodes: Cervical, supraclavicular, and axillary nodes normal.  Neurologic: CN 2 -12 Normal, Normal symmetric reflexes. Normal coordination and gait  Psych: Alert & Oriented x 3, Mood appear stable.    Assessment:    Annual wellness medicare exam   Plan:    During the course of the visit the patient was educated and counseled about appropriate screening and preventive services including:   Annual mammogram     Patient Instructions (the written plan) was given to the patient.  Medicare Attestation  I have personally reviewed:  The patient's medical and social history  Their use of alcohol, tobacco or illicit drugs  Their current medications and supplements  The patient's functional ability including ADLs,fall risks, home safety risks, cognitive, and hearing and visual impairment  Diet and physical activities  Evidence for depression or mood disorders  The patient's weight, height, BMI, and visual acuity have been recorded in the chart. I have made referrals, counseling, and provided education to the patient based on review of the above and I have provided the patient with a written personalized care plan for preventive services.

## 2015-12-20 ENCOUNTER — Other Ambulatory Visit: Payer: Self-pay | Admitting: Internal Medicine

## 2015-12-22 ENCOUNTER — Other Ambulatory Visit: Payer: Self-pay | Admitting: Internal Medicine

## 2016-01-09 ENCOUNTER — Encounter: Payer: Self-pay | Admitting: Internal Medicine

## 2016-01-09 LAB — COLOGUARD: Cologuard: NEGATIVE

## 2016-02-27 ENCOUNTER — Other Ambulatory Visit: Payer: Self-pay | Admitting: Internal Medicine

## 2016-03-02 ENCOUNTER — Other Ambulatory Visit: Payer: Self-pay | Admitting: Internal Medicine

## 2016-03-21 ENCOUNTER — Encounter: Payer: Self-pay | Admitting: Internal Medicine

## 2016-03-21 ENCOUNTER — Ambulatory Visit (INDEPENDENT_AMBULATORY_CARE_PROVIDER_SITE_OTHER): Payer: Commercial Managed Care - HMO | Admitting: Internal Medicine

## 2016-03-21 VITALS — BP 136/62 | HR 73 | Temp 98.6°F | Ht 60.5 in | Wt 123.5 lb

## 2016-03-21 DIAGNOSIS — M545 Low back pain: Secondary | ICD-10-CM | POA: Diagnosis not present

## 2016-03-21 DIAGNOSIS — M546 Pain in thoracic spine: Secondary | ICD-10-CM | POA: Diagnosis not present

## 2016-03-21 DIAGNOSIS — M791 Myalgia: Secondary | ICD-10-CM

## 2016-03-21 DIAGNOSIS — M7918 Myalgia, other site: Secondary | ICD-10-CM

## 2016-03-21 LAB — POCT URINALYSIS DIPSTICK
Bilirubin, UA: NEGATIVE
Blood, UA: NEGATIVE
Glucose, UA: NEGATIVE
KETONES UA: NEGATIVE
LEUKOCYTES UA: NEGATIVE
NITRITE UA: NEGATIVE
PH UA: 7
PROTEIN UA: NEGATIVE
Spec Grav, UA: <=1.005
Urobilinogen, UA: 0.2

## 2016-03-21 MED ORDER — CYCLOBENZAPRINE HCL 10 MG PO TABS: ORAL_TABLET | ORAL | 0 refills | Status: DC

## 2016-03-21 MED ORDER — MELOXICAM 15 MG PO TABS: 15.0000 mg | ORAL_TABLET | Freq: Every day | ORAL | 0 refills | Status: DC

## 2016-03-21 MED ORDER — HYDROCHLOROTHIAZIDE 25 MG PO TABS
25.0000 mg | ORAL_TABLET | Freq: Every day | ORAL | 2 refills | Status: DC
Start: 2016-03-21 — End: 2016-06-19

## 2016-03-21 NOTE — Progress Notes (Signed)
   Subjective:    Patient ID: Debra Ruiz, female    DOB: Dec 07, 1945, 70 y.o.   MRN: [id number]  HPI Patient says that she apparently strained her back doing yard activity recently and for couple of weeks has had considerable pain below the left posterior rib cage area and in right buttock. Pain in buttock does not extend into leg. No weakness or numbness and right leg. She tried massage with Huston Foley which helped a little bit but did not completely relieved her pain. She read an article that statin medications could cause back pain. However she has 2 different types of back pain and on all think that is consistent with statin issues. She is anxious. Has tried Aleve and Advil but apparently that caused dyspepsia   Review of Systems     Objective:   Physical Exam Straight leg raising is negative today at 90 bilaterally. Deep tendon reflexes are 2+ and symmetrical in the knees. Muscle strength is normal in the lower extremities. She is tender along her left posterior rib cage area lower rehabilitation.       Assessment & Plan:  Right lower back pain  Left posterior thoracic pain  Plan: Meloxicam 15 mg daily. Flexeril 10 mg one half tablet at bedtime.

## 2016-03-21 NOTE — Patient Instructions (Signed)
To try Flexeril and meloxicam for 10 days and see if pain improves.

## 2016-05-07 ENCOUNTER — Other Ambulatory Visit: Payer: Self-pay | Admitting: Internal Medicine

## 2016-05-13 ENCOUNTER — Ambulatory Visit (INDEPENDENT_AMBULATORY_CARE_PROVIDER_SITE_OTHER): Payer: Commercial Managed Care - HMO | Admitting: Internal Medicine

## 2016-05-13 ENCOUNTER — Encounter: Payer: Self-pay | Admitting: Internal Medicine

## 2016-05-13 ENCOUNTER — Telehealth: Payer: Self-pay | Admitting: Internal Medicine

## 2016-05-13 VITALS — BP 130/84 | HR 82 | Temp 98.9°F | Wt 124.5 lb

## 2016-05-13 DIAGNOSIS — Z8739 Personal history of other diseases of the musculoskeletal system and connective tissue: Secondary | ICD-10-CM | POA: Diagnosis not present

## 2016-05-13 DIAGNOSIS — M5432 Sciatica, left side: Secondary | ICD-10-CM

## 2016-05-13 MED ORDER — CYCLOBENZAPRINE HCL 10 MG PO TABS: ORAL_TABLET | ORAL | 0 refills | Status: DC

## 2016-05-13 MED ORDER — PREDNISONE 10 MG PO TABS: ORAL_TABLET | ORAL | 0 refills | Status: DC

## 2016-05-13 NOTE — Patient Instructions (Signed)
Sterapred DS 10 mg 6 day dosepak. Flexeril 10 mg at bedtime.

## 2016-05-13 NOTE — Telephone Encounter (Signed)
Patient called to say she has scheduled her MRI of the LS spine from October 29

## 2016-05-13 NOTE — Progress Notes (Signed)
   Subjective:    Patient ID: Debra Ruiz, female    DOB: [birth year], 70 y.o.   MRN: [id number]  HPI  70 year old Female with history of spondylosis L3-L4 and modertately severe canal stenosis with anterolisthesis in 2011.  Hx low back pain seen August 31st treated  With meloxicam and Flexeril. Improved somewhat however  Fell at beach at night going to bathroom late in September. Has pain in left buttock going down left leg. Has pain in left quadriceps muscle.  [facility] in early November.Worried about riding long distance in car.  No documented Left leg weakness just pain in left buttock and quadriceps muscle with radiation down leg.    Review of Systems as above     Objective:   Physical Exam Straight leg raising positive at 90 degrees on left. Normal DTRs and muscle strength.       Assessment & Plan:  Left sciatica  History of moderately severe central canal stenosis L3-L4  Left quadriceps pain  Plan: Will try to get MRI approved of LS spine. Sterapred DS 10 mg 6 day dosepack. Flexeril 10 mg at bedtime.

## 2016-05-13 NOTE — Addendum Note (Signed)
Addended by: Tamera Punt C on: 05/13/2016 04:52 PM   Modules accepted: Orders

## 2016-05-19 ENCOUNTER — Ambulatory Visit
Admission: RE | Admit: 2016-05-19 | Discharge: 2016-05-19 | Disposition: A | Discharge status: Home / Self Care | Payer: Self-pay | Source: Ambulatory Visit | Attending: Internal Medicine | Admitting: Internal Medicine

## 2016-05-19 DIAGNOSIS — M5432 Sciatica, left side: Secondary | ICD-10-CM

## 2016-05-19 DIAGNOSIS — Z8739 Personal history of other diseases of the musculoskeletal system and connective tissue: Secondary | ICD-10-CM

## 2016-06-19 ENCOUNTER — Other Ambulatory Visit: Payer: Self-pay | Admitting: Internal Medicine

## 2016-06-19 NOTE — Telephone Encounter (Signed)
Refill each x 6 months 

## 2016-07-09 ENCOUNTER — Encounter: Payer: Self-pay | Admitting: Internal Medicine

## 2016-07-09 ENCOUNTER — Ambulatory Visit (INDEPENDENT_AMBULATORY_CARE_PROVIDER_SITE_OTHER): Payer: Commercial Managed Care - HMO | Admitting: Internal Medicine

## 2016-07-09 VITALS — BP 152/72 | HR 98 | Temp 100.0°F | Ht 61.0 in | Wt 125.0 lb

## 2016-07-09 DIAGNOSIS — M549 Dorsalgia, unspecified: Secondary | ICD-10-CM

## 2016-07-09 MED ORDER — OXYCODONE-ACETAMINOPHEN 5-325 MG PO TABS: 1.0000 | ORAL_TABLET | Freq: Four times a day (QID) | ORAL | 0 refills | Status: DC | PRN

## 2016-07-09 MED ORDER — PREDNISONE 10 MG PO TABS: ORAL_TABLET | ORAL | 0 refills | Status: DC

## 2016-07-09 NOTE — Progress Notes (Signed)
   Subjective:    Patient ID: Debra Ruiz, female    DOB: 1946/05/19, 70 y.o.   MRN: [id number]  HPI   70 year old Female with back pain onset mid back pain bilaterally Sunday December 17th after going up in attic the day before. Did not do heavy lifting.She is in exquisite pain today.    Review of Systems     Objective:   Physical Exam  Tender lower thoracic spine bilaterally right greater than left. No LE weakness or numbness.       Assessment & Plan:  Musculoskeletal pain/strain  Plan: Oxycodone a pap 5/325 one by mouth every 6 hours when necessary pain. Sterapred DS 10 mg six-day Dosepak. No heavy lifting or going into the attic until better.

## 2016-07-21 NOTE — Patient Instructions (Signed)
Oxycodone a Pap 5/325 one by mouth every 6 hours when necessary pain. Sterapred 10 mg six-day Dosepak. Avoid heavy lifting and activity until improved.

## 2016-07-30 ENCOUNTER — Encounter (INDEPENDENT_AMBULATORY_CARE_PROVIDER_SITE_OTHER): Payer: Self-pay | Admitting: Physical Medicine and Rehabilitation

## 2016-07-30 ENCOUNTER — Other Ambulatory Visit (INDEPENDENT_AMBULATORY_CARE_PROVIDER_SITE_OTHER): Payer: Self-pay | Admitting: Physical Medicine and Rehabilitation

## 2016-07-30 ENCOUNTER — Ambulatory Visit (INDEPENDENT_AMBULATORY_CARE_PROVIDER_SITE_OTHER): Payer: PPO | Admitting: Physical Medicine and Rehabilitation

## 2016-07-30 VITALS — BP 144/76 | HR 102

## 2016-07-30 DIAGNOSIS — M48061 Spinal stenosis, lumbar region without neurogenic claudication: Secondary | ICD-10-CM

## 2016-07-30 DIAGNOSIS — M545 Low back pain, unspecified: Secondary | ICD-10-CM | POA: Insufficient documentation

## 2016-07-30 DIAGNOSIS — G8929 Other chronic pain: Secondary | ICD-10-CM | POA: Insufficient documentation

## 2016-07-30 DIAGNOSIS — S22000A Wedge compression fracture of unspecified thoracic vertebra, initial encounter for closed fracture: Secondary | ICD-10-CM | POA: Insufficient documentation

## 2016-07-30 MED ORDER — BACLOFEN 10 MG PO TABS: 10.0000 mg | ORAL_TABLET | Freq: Three times a day (TID) | ORAL | 0 refills | Status: DC | PRN

## 2016-07-30 NOTE — Progress Notes (Signed)
Debra Ruiz - 71 y.o. female MRN [id number]  Date of birth: 1946/01/19  Office Visit Note: Visit Date: 07/30/2016 PCP: Elby Showers, MD Referred by: Elby Showers, MD  Subjective: Chief Complaint  Patient presents with  . Lower Back - Pain   HPI: Debra Ruiz is a 71 year old female with worsening low thoracic upper lumbar pain with significant spasm. She reports that sometime in September she did have a fall while he read the beach and she's not sure if it started at that point but it did cause a lot of pain. Prior to that been several years ago she had pain with radiating symptoms down the leg. She has had injection before that did help that. She had a recent MRI in the end of October to did show a T12 compression fracture which was subacute with edema on STIR imaging. She also had fairly severe multifactorial stenosis at L3-for and moderate to severe at L4-5 of multilevel facet arthropathy. She comes in today without any radicular pain and no weakness and no focal weakness or foot drop. No bowel or bladder changes. No new trauma but continued low thoracic upper lumbar pain. She says she can hardly get up and down at this point. She has not been wearing a brace. She was given Percocet by her primary care physician when she tries not to take but does take it when she needs it. She has not had any specific physical therapy. No numbness tingling or paresthesias. Pain worse with coughing or movement. She states that even breathing causes her pain. She rates her pain as an 8 out of 10. Lower and middle back pain with muscle spasms. MRI 05/19/16. Pain does not radiate now. Has radiated down left leg in the past. "Even breathing hurts." Sits with heating pad in the afternoon- pain gets worse by afternoon. Uses biofreeze- it helps some. Pain level right now is about an 8. She states that her primary care physician has monitored any sign of osteoporosis and she does not carry a diagnosis of this. She is  not taking any medications for osteoporosis.     Review of Systems  Constitutional: Negative for chills, fever, malaise/fatigue and weight loss.  HENT: Negative for hearing loss and sinus pain.   Eyes: Negative for blurred vision, double vision and photophobia.  Respiratory: Negative for cough and shortness of breath.   Cardiovascular: Negative for chest pain, palpitations and leg swelling.  Gastrointestinal: Negative for abdominal pain, nausea and vomiting.  Genitourinary: Negative for flank pain.  Musculoskeletal: Positive for back pain. Negative for myalgias.  Skin: Negative for itching and rash.  Neurological: Positive for weakness. Negative for tremors and focal weakness.       Spasms, episodic  Endo/Heme/Allergies: Negative.   Psychiatric/Behavioral: Negative for depression.  All other systems reviewed and are negative.  Otherwise per HPI.  Assessment & Plan: Visit Diagnoses:  1. Thoracic compression fracture, closed, initial encounter ([place])   2. Chronic bilateral low back pain without sciatica   3. Spinal stenosis of lumbar region without neurogenic claudication     Plan: Findings:  Chronic history of low back pain with history of stenosis and radicular pattern but now with fairly subacute onset over the last 2 months of low thoracic upper lumbar pain which seems to be consistent with compression fracture of the T12 vertebral body. MRI evidence of edema on STIR imaging shows subacute nature. He likely happen from a fall a month prior. This is not  really been relieved with pain medications or rest. I am going to prescribe a thoracolumbar corset with extension bias. I have [employer] fit that for her. Were also to refer her to Dr. Patrecia Pour and radiology for evaluation for possible kyphoplasty. She should continue to take her oxycodone as needed as prescribed by her primary care physician. I did give her baclofen as well as a muscle relaxer and explained the use of that. I did talk  about the natural history of compression fractures and this will likely heal to a degree and she will have less pain from it but right now she is pretty miserable. Depending on what Dr. Patrecia Pour hasn't store they may want to repeat any images to see if there's been any progression over the last couple of months. There is no retropulsion on the prior MRI. I spent more than 40 minutes speaking face-to-face with the patient with 50% of the time in counseling.    Meds & Orders:  Meds ordered this encounter  Medications  . baclofen (LIORESAL) 10 MG tablet    Sig: Take 1 tablet (10 mg total) by mouth every 8 (eight) hours as needed for muscle spasms (Pain).    Dispense:  60 tablet    Refill:  0    Orders Placed This Encounter  Procedures  . Ambulatory referral to Interventional Radiology    Follow-up: Return if symptoms worsen or fail to improve.   Procedures: No procedures performed  No notes on file   Clinical History: Lumbar spine MRI 05/19/2016 Alignment: Mild anterior listhesis L1-2 and L2-3. Mild anterior listhesis L3-4 and L4-5.  Vertebrae: Moderate compression fracture T12 with diffuse bone marrow edema. This is most consistent with a subacute benign fracture. This was not present on the prior MRI. No other fracture.  Conus medullaris: Extends to the L1-2 level and appears normal.  Paraspinal and other soft tissues: Paraspinous muscles are symmetric and without focal abnormality. No retroperitoneal mass.  Disc levels:  T11-12: Negative  T12-L1: Mild retrolisthesis without canal stenosis. No significant retropulsion of T12 into the canal  L1-2: 4 mm retrolisthesis. Disc degeneration with disc bulging and spurring. Subarticular and foraminal stenosis on the right. Mild canal stenosis.  L2-3: 5 mm retrolisthesis. Diffuse disc bulging and spurring. Bilateral facet hypertrophy. Mild canal stenosis and mild to moderate right foraminal stenosis.  L3-4: 4 mm  anterior listhesis. Severe facet degeneration. Moderate to severe canal stenosis. Mild foraminal narrowing bilaterally. Mild subarticular stenosis bilaterally.  L4-5: Disc bulging and endplate spurring. Moderate facet hypertrophy. Moderate canal stenosis. Subarticular and foraminal stenosis bilaterally left greater than right  L5-S1:  Mild disc and facet degeneration without stenosis  She reports that she quit smoking about 31 years ago. Her smoking use included Cigarettes. She has never used smokeless tobacco. No results for input(s): HGBA1C, LABURIC in the last 8760 hours.  Objective:  VS:  HT:    WT:   BMI:     BP:(!) 144/76  HR:(!) 102bpm  TEMP: ( )  RESP:  Physical Exam  Constitutional: She is oriented to person, place, and time. She appears well-developed and well-nourished. No distress.  HENT:  Head: Normocephalic and atraumatic.  Nose: Nose normal.  Mouth/Throat: Oropharynx is clear and moist.  Eyes: Conjunctivae are normal. Pupils are equal, round, and reactive to light.  Neck: Normal range of motion. Neck supple.  Cardiovascular: Regular rhythm and intact distal pulses.   Tachycardia  Pulmonary/Chest: Effort normal and breath sounds normal.  Abdominal: She exhibits no  distension.  Musculoskeletal:  The patient has a very difficult time going from sit to stand due to pain. She stands with a list to the right. She ambulates very gingerly. She has episodic spasms and walk back cry out in pain when this happens. She has good distal strength 5 out of 5 in all muscle groups without any deficits. She does have increased pain with Valsalva. She has no pain over the greater trochanters. She does have pretty exquisite pain trying to rock over the body. This is at about the T12 region.  Neurological: She is alert and oriented to person, place, and time. She displays normal reflexes. No sensory deficit. She exhibits normal muscle tone.  Skin: Skin is warm. No rash noted. No erythema.   Psychiatric: She has a normal mood and affect. Her behavior is normal.  Nursing note and vitals reviewed.   Ortho Exam Imaging: No results found.  Past Medical/Family/Surgical/Social History: Medications & Allergies reviewed per EMR Patient Active Problem List   Diagnosis Date Noted  . Thoracic compression fracture, closed, initial encounter ([place]) 07/30/2016  . Chronic bilateral low back pain without sciatica 07/30/2016  . Spinal stenosis of lumbar region without neurogenic claudication 07/30/2016  . Blepharitis 07/01/2012  . Cataract, nuclear 07/01/2012  . GE reflux 03/21/2012  . Dry eye syndrome 03/21/2012  . Hearing loss 03/21/2012  . Back pain, lumbosacral 03/21/2012  . Depression 06/17/2011  . Anxiety 02/15/2011  . Hypertension 02/15/2011  . Hyperlipidemia 02/15/2011   Past Medical History:  Diagnosis Date  . Depression   . Dry eyes   . GERD (gastroesophageal reflux disease)   . Hyperlipidemia   . Insomnia   . Palpitations   . Vaginal dryness    Family History  Problem Relation Age of Onset  . Hypertension Mother   . Stroke Mother   . Heart disease Father   . Heart disease Brother    History reviewed. No pertinent surgical history. Social History   Occupational History  . Not on file.   Social History Main Topics  . Smoking status: Former Smoker    Types: Cigarettes    Quit date: 02/13/1985  . Smokeless tobacco: Never Used  . Alcohol use 12.5 oz/week    25 Standard drinks or equivalent per week  . Drug use: No  . Sexual activity: Not on file

## 2016-08-01 ENCOUNTER — Other Ambulatory Visit (INDEPENDENT_AMBULATORY_CARE_PROVIDER_SITE_OTHER): Payer: Self-pay | Admitting: Physical Medicine and Rehabilitation

## 2016-08-01 DIAGNOSIS — S22000A Wedge compression fracture of unspecified thoracic vertebra, initial encounter for closed fracture: Secondary | ICD-10-CM

## 2016-08-02 ENCOUNTER — Telehealth: Payer: Self-pay | Admitting: Internal Medicine

## 2016-08-02 ENCOUNTER — Encounter: Payer: Self-pay | Admitting: Internal Medicine

## 2016-08-02 ENCOUNTER — Other Ambulatory Visit: Payer: Self-pay | Admitting: Orthopedic Surgery

## 2016-08-02 DIAGNOSIS — M545 Low back pain: Secondary | ICD-10-CM

## 2016-08-02 NOTE — Telephone Encounter (Signed)
Received phone call from patient at 5:45 PM indicating she was supposed to hear back from Dr. to [facility] office regarding an MRI. Says she continues to be in a lot of pain with her back. She wanted to go ahead and get MRI done this weekend. She says the office is closed. I called the office and left a message with the answering service regarding her predicament. I then called her back and told her we had put in a call to the answering service for the on-call physician to speak with her. She has pain medication on hand.

## 2016-08-05 NOTE — Telephone Encounter (Signed)
Will schedule appointment here.

## 2016-08-05 NOTE — Telephone Encounter (Signed)
Please set up OV 

## 2016-08-06 ENCOUNTER — Other Ambulatory Visit: Payer: Self-pay

## 2016-08-10 ENCOUNTER — Other Ambulatory Visit: Payer: Self-pay

## 2016-08-10 ENCOUNTER — Ambulatory Visit
Admission: RE | Admit: 2016-08-10 | Discharge: 2016-08-10 | Disposition: A | Discharge status: Home / Self Care | Payer: PPO | Source: Ambulatory Visit | Attending: Orthopedic Surgery | Admitting: Orthopedic Surgery

## 2016-08-10 DIAGNOSIS — M545 Low back pain: Secondary | ICD-10-CM

## 2016-08-10 DIAGNOSIS — M48061 Spinal stenosis, lumbar region without neurogenic claudication: Secondary | ICD-10-CM | POA: Diagnosis not present

## 2016-08-12 DIAGNOSIS — M545 Low back pain: Secondary | ICD-10-CM | POA: Diagnosis not present

## 2016-08-13 ENCOUNTER — Other Ambulatory Visit: Payer: Self-pay | Admitting: Orthopedic Surgery

## 2016-08-19 ENCOUNTER — Ambulatory Visit: Payer: PPO

## 2016-08-19 ENCOUNTER — Ambulatory Visit (HOSPITAL_BASED_OUTPATIENT_CLINIC_OR_DEPARTMENT_OTHER)
Admission: RE | Admit: 2016-08-19 | Discharge: 2016-08-19 | Disposition: A | Discharge status: Home / Self Care | Payer: PPO | Source: Ambulatory Visit | Attending: Hematology & Oncology | Admitting: Hematology & Oncology

## 2016-08-19 ENCOUNTER — Ambulatory Visit (HOSPITAL_BASED_OUTPATIENT_CLINIC_OR_DEPARTMENT_OTHER): Payer: PPO | Admitting: Hematology & Oncology

## 2016-08-19 ENCOUNTER — Other Ambulatory Visit (HOSPITAL_BASED_OUTPATIENT_CLINIC_OR_DEPARTMENT_OTHER): Payer: PPO

## 2016-08-19 VITALS — BP 136/64 | HR 89 | Temp 98.6°F | Resp 16 | Wt 120.0 lb

## 2016-08-19 DIAGNOSIS — R898 Other abnormal findings in specimens from other organs, systems and tissues: Secondary | ICD-10-CM | POA: Diagnosis not present

## 2016-08-19 DIAGNOSIS — R779 Abnormality of plasma protein, unspecified: Secondary | ICD-10-CM | POA: Diagnosis not present

## 2016-08-19 DIAGNOSIS — D649 Anemia, unspecified: Secondary | ICD-10-CM | POA: Diagnosis not present

## 2016-08-19 DIAGNOSIS — I700 Atherosclerosis of aorta: Secondary | ICD-10-CM | POA: Diagnosis not present

## 2016-08-19 DIAGNOSIS — M5030 Other cervical disc degeneration, unspecified cervical region: Secondary | ICD-10-CM | POA: Diagnosis not present

## 2016-08-19 DIAGNOSIS — M545 Low back pain: Secondary | ICD-10-CM | POA: Diagnosis not present

## 2016-08-19 DIAGNOSIS — C9000 Multiple myeloma not having achieved remission: Secondary | ICD-10-CM

## 2016-08-19 DIAGNOSIS — M5136 Other intervertebral disc degeneration, lumbar region: Secondary | ICD-10-CM | POA: Diagnosis not present

## 2016-08-19 DIAGNOSIS — M5134 Other intervertebral disc degeneration, thoracic region: Secondary | ICD-10-CM | POA: Insufficient documentation

## 2016-08-19 LAB — CBC WITH DIFFERENTIAL (CANCER CENTER ONLY)
BASO#: 0 10*3/uL (ref 0.0–0.2)
BASO%: 0.2 % (ref 0.0–2.0)
EOS ABS: 0.1 10*3/uL (ref 0.0–0.5)
EOS%: 0.8 % (ref 0.0–7.0)
HEMATOCRIT: 32.2 % — AB (ref 34.8–46.6)
HEMOGLOBIN: 10.6 g/dL — AB (ref 11.6–15.9)
LYMPH#: 1.6 10*3/uL (ref 0.9–3.3)
LYMPH%: 26.4 % (ref 14.0–48.0)
MCH: 30.8 pg (ref 26.0–34.0)
MCHC: 32.9 g/dL (ref 32.0–36.0)
MCV: 94 fL (ref 81–101)
MONO#: 0.8 10*3/uL (ref 0.1–0.9)
MONO%: 12.2 % (ref 0.0–13.0)
NEUT%: 60.4 % (ref 39.6–80.0)
NEUTROS ABS: 3.7 10*3/uL (ref 1.5–6.5)
Platelets: 189 10*3/uL (ref 145–400)
RBC: 3.44 10*6/uL — ABNORMAL LOW (ref 3.70–5.32)
RDW: 15.9 % — AB (ref 11.1–15.7)
WBC: 6.1 10*3/uL (ref 3.9–10.0)

## 2016-08-19 LAB — LACTATE DEHYDROGENASE: LDH: 293 U/L — ABNORMAL HIGH (ref 125–245)

## 2016-08-19 LAB — CMP (CANCER CENTER ONLY)
ALBUMIN: 3.6 g/dL (ref 3.3–5.5)
ALT(SGPT): 34 U/L (ref 10–47)
AST: 40 U/L — AB (ref 11–38)
Alkaline Phosphatase: 75 U/L (ref 26–84)
BUN, Bld: 11 mg/dL (ref 7–22)
CALCIUM: 10.9 mg/dL — AB (ref 8.0–10.3)
CO2: 30 meq/L (ref 18–33)
Chloride: 96 mEq/L — ABNORMAL LOW (ref 98–108)
Creat: 0.6 mg/dl (ref 0.6–1.2)
Glucose, Bld: 113 mg/dL (ref 73–118)
POTASSIUM: 3.6 meq/L (ref 3.3–4.7)
Sodium: 140 mEq/L (ref 128–145)
Total Bilirubin: 0.6 mg/dl (ref 0.20–1.60)
Total Protein: 11.7 g/dL — ABNORMAL HIGH (ref 6.4–8.1)

## 2016-08-19 LAB — TECHNOLOGIST REVIEW CHCC SATELLITE

## 2016-08-19 LAB — CHCC SATELLITE - SMEAR

## 2016-08-19 NOTE — Progress Notes (Signed)
Referral MD  Reason for Referral: Probable multiple myeloma-suspicious lesions on MRI   No chief complaint on file. : Debra Ruiz is really hurting because of a fracture.  HPI: Debra Ruiz is a very nice 71 year old white female. She has been pretty healthy. She began to experience some back discomfort back in the fall. She fell in September. I think she may have had an MRI in October. There was a compression fracture at T12.  The pain got worse. She went to see Dr. Lynann Bologna. He ordered another MRI. This was done on July 20. This showed an acute compression fracture at T11. There was a old fracture at T12. There were marrow space lesions which were felt to be consistent with myeloma or metastatic disease.  She is scheduled for a kyphoplasty on February 1.  She was kindly referred to the [facility] for an evaluation.  She's had no problems with recurrent infections. She's had no weight loss or weight gain. There's been no change in bowel or bladder habits outside of that associated with pain medication. She's had no leg weakness. She's had no leg pain.  There's been no rashes.  She had her last mammogram about 2 years ago. She's not sure when her last colonoscopy was.  She's never been pregnant. She has not noted any changes with her breast on self breast exam. She's not noted any swollen lymph nodes.  She has never smoked. She really does not drink. She has no occupational exposures. She has no family history of cancer.  There's been no headache. She's had no dysphagia or odynophagia.  Overall, her performance status is ECOG 2.    Past Medical History:  Diagnosis Date  . Depression   . Dry eyes   . GERD (gastroesophageal reflux disease)   . Hyperlipidemia   . Insomnia   . Palpitations   . Vaginal dryness   :  No past surgical history on file.:   Current Outpatient Prescriptions:  .  ALPRAZolam (XANAX) 0.25 MG tablet, Take 1 tablet (0.25 mg total) by mouth 2  (two) times daily as needed for anxiety., Disp: 60 tablet, Rfl: 0 .  amLODipine (NORVASC) 5 MG tablet, TAKE 1 TABLET EVERY DAY, Disp: 90 tablet, Rfl: 3 .  aspirin EC 81 MG tablet, Take 81 mg by mouth daily., Disp: , Rfl:  .  atorvastatin (LIPITOR) 20 MG tablet, TAKE 1 TABLET EVERY DAY (Patient taking differently: TAKE 1 TABLET EVERY EVENING), Disp: 90 tablet, Rfl: 1 .  baclofen (LIORESAL) 10 MG tablet, Take 1 tablet (10 mg total) by mouth every 8 (eight) hours as needed for muscle spasms (Pain)., Disp: 60 tablet, Rfl: 0 .  BYSTOLIC 5 MG tablet, TAKE 1 TABLET EVERY DAY, Disp: 90 tablet, Rfl: 1 .  escitalopram (LEXAPRO) 20 MG tablet, TAKE 1 TABLET EVERY DAY, Disp: 90 tablet, Rfl: 2 .  hydrochlorothiazide (HYDRODIURIL) 25 MG tablet, TAKE 1 TABLET EVERY DAY, Disp: 90 tablet, Rfl: 2 .  Multiple Vitamins-Minerals (CENTRUM SILVER PO), Take 1 tablet by mouth daily., Disp: , Rfl:  .  nebivolol (BYSTOLIC) 5 MG tablet, Take 5 mg by mouth daily., Disp: , Rfl:  .  Omega-3 Fatty Acids (FISH OIL) 1000 MG CAPS, Take 1,000 mg by mouth daily. WILL STOP PRIOR TO PROCEDURE, Disp: , Rfl:  .  oxycodone (OXY-IR) 5 MG capsule, Take 5 mg by mouth every 4 (four) hours as needed for pain. , Disp: , Rfl:  .  oxyCODONE-acetaminophen (ROXICET) 5-325 MG tablet, Take  1 tablet by mouth every 6 (six) hours as needed for severe pain., Disp: 60 tablet, Rfl: 0 .  potassium chloride SA (K-DUR,KLOR-CON) 20 MEQ tablet, TAKE 1 TABLET EVERY DAY, Disp: 90 tablet, Rfl: 1 .  predniSONE (DELTASONE) 10 MG tablet, Take in tapering course as directed 6-5-4-3-2-1 q am with food, Disp: 21 tablet, Rfl: 0 .  valACYclovir (VALTREX) 1000 MG tablet, Take 1,000 mg by mouth 2 (two) times daily as needed (FEVER BLISTERS). , Disp: , Rfl: :  :  Allergies  Allergen Reactions  . Crestor [Rosuvastatin Calcium]     Causes liver functions to be elevated  :  Family History  Problem Relation Age of Onset  . Hypertension Mother   . Stroke Mother   . Heart  disease Father   . Heart disease Brother   :  Social History   Social History  . Marital status: Single    Spouse name: N/A  . Number of children: N/A  . Years of education: N/A   Occupational History  . Not on file.   Social History Main Topics  . Smoking status: Former Smoker    Types: Cigarettes    Quit date: 02/13/1985  . Smokeless tobacco: Never Used  . Alcohol use 12.5 oz/week    25 Standard drinks or equivalent per week  . Drug use: No  . Sexual activity: Not on file   Other Topics Concern  . Not on file   Social History Narrative  . No narrative on file  :  Pertinent items are noted in HPI.  Exam: '@IPVITALS' @ [place] white female in no obvious distress. Vital signs show temperature of 97.8. Pulse is 89. Blood pressure 136/64. Weight is 120 pounds. Head neck exam shows no ocular or oral lesions. She has no palpable cervical or supraclavicular lymph nodes. Lungs are clear. Cardiac exam regular rate and rhythm with no murmurs, rubs or bruits. Abdomen is soft. She has good bowel sounds. There is no fluid wave. There is no palpable liver or spleen tip. Back exam shows some tenderness in the lumbosacral spine. Some muscle spasms are noted in the lower back. Extremities shows no clubbing, cyanosis or edema. She has no weakness in her legs. She has decent range of motion of her joints. Neurological exam shows no focal neurological deficits. Skin exam shows no rashes, ecchymoses or petechia.    Recent Labs  08/19/16 1106  WBC 6.1  HGB 10.6*  HCT 32.2*  PLT 189    Recent Labs  08/19/16 1106  NA 140  K 3.6  CL 96*  CO2 30  GLUCOSE 113  BUN 11  CREATININE 0.6  CALCIUM 10.9*    Blood smear review: Normochromic and normocytic population of red blood cells. There is definite Rouleau formation. I see no nucleated red blood cells. White blood cells premature. There are no hypersegmented polys. There may be some plasmacytoid type lymphocytes. Platelets are adequate in  number and size.  Pathology: None     Assessment and Plan:  Debra Ruiz is a very nice 71 year old white female. She, in my mind, has myeloma. Everything fits for myeloma. She is anemic. She has a mildly elevated total protein. She has the [facility] formation on her blood smear.  Hopefully, when she gets her kyphoplasty done, biopsies will be taken so we can assess for the myeloma percentage in the bone marrow. We may actually have to do a formal bone marrow biopsy on her.  We will get a bone survey  on her today.  We will do a 24-hour urine on her.  I started talking to her and her husband about the diagnosis and the treatment options. I would think that was reconfirmed myeloma, that we should get her started with therapy. I suspect she may also need radiation therapy to the lumbar spine. She definitely will need a bone Harner. The FDA just approved Xgeva for myeloma so we can probably use this.  As far as systemic therapy, I would recommend Revlimid/Velcade/Decadron. I think this would be considered standard therapy. I'm not sure she would be considered a transplant candidate.  I spent about an hour with she and her husband. They're both very nice. I gave her a prayer blanket. She is very thankful for this.  With a get the results back from all of her studies, then we will get her back in and make our plans for definitive therapy.

## 2016-08-20 ENCOUNTER — Ambulatory Visit (HOSPITAL_COMMUNITY)
Admission: RE | Admit: 2016-08-20 | Discharge: 2016-08-20 | Disposition: A | Discharge status: Home / Self Care | Payer: PPO | Source: Ambulatory Visit | Attending: Orthopedic Surgery | Admitting: Orthopedic Surgery

## 2016-08-20 ENCOUNTER — Encounter (HOSPITAL_COMMUNITY)
Admission: RE | Admit: 2016-08-20 | Discharge: 2016-08-20 | Disposition: A | Discharge status: Home / Self Care | Payer: PPO | Source: Ambulatory Visit | Attending: Orthopedic Surgery | Admitting: Orthopedic Surgery

## 2016-08-20 ENCOUNTER — Encounter (HOSPITAL_COMMUNITY): Payer: Self-pay

## 2016-08-20 DIAGNOSIS — Z01818 Encounter for other preprocedural examination: Secondary | ICD-10-CM

## 2016-08-20 DIAGNOSIS — Z01810 Encounter for preprocedural cardiovascular examination: Secondary | ICD-10-CM | POA: Diagnosis not present

## 2016-08-20 HISTORY — DX: Unspecified osteoarthritis, unspecified site: M19.90

## 2016-08-20 HISTORY — DX: Anemia, unspecified: D64.9

## 2016-08-20 HISTORY — DX: Anxiety disorder, unspecified: F41.9

## 2016-08-20 HISTORY — DX: Essential (primary) hypertension: I10

## 2016-08-20 LAB — URINALYSIS, ROUTINE W REFLEX MICROSCOPIC
Bilirubin Urine: NEGATIVE
Glucose, UA: NEGATIVE mg/dL
HGB URINE DIPSTICK: NEGATIVE
Ketones, ur: NEGATIVE mg/dL
Nitrite: NEGATIVE
PROTEIN: NEGATIVE mg/dL
Specific Gravity, Urine: 1.012 (ref 1.005–1.030)
pH: 7 (ref 5.0–8.0)

## 2016-08-20 LAB — SURGICAL PCR SCREEN
MRSA, PCR: NEGATIVE
STAPHYLOCOCCUS AUREUS: NEGATIVE

## 2016-08-20 LAB — IGG, IGA, IGM
IgA, Qn, Serum: 20 mg/dL — ABNORMAL LOW (ref 87–352)
IgG, Qn, Serum: 5721 mg/dL — ABNORMAL HIGH (ref 700–1600)
IgM, Qn, Serum: 20 mg/dL — ABNORMAL LOW (ref 26–217)

## 2016-08-20 LAB — KAPPA/LAMBDA LIGHT CHAINS
Ig Kappa Free Light Chain: 626.2 mg/L — ABNORMAL HIGH (ref 3.3–19.4)
Ig Lambda Free Light Chain: 3.2 mg/L — ABNORMAL LOW (ref 5.7–26.3)
KAPPA/LAMBDA FLC RATIO: 195.69 — AB (ref 0.26–1.65)

## 2016-08-20 LAB — PROTIME-INR
INR: 1.06
PROTHROMBIN TIME: 13.9 s (ref 11.4–15.2)

## 2016-08-20 LAB — APTT: APTT: 25 s (ref 24–36)

## 2016-08-20 NOTE — Progress Notes (Signed)
Pt saw Dr. Burney Gauze  On 08-19-2016 with probable diagnosis of myeloma . Labs were drawn at his office,results of CBC with diff and CMET  Are in Epic.

## 2016-08-20 NOTE — Pre-Procedure Instructions (Signed)
    MAGEN VOLIN  08/20/2016      [id number] - [address], [address] AT [address] [place] [address] Phone: [phone] Fax: [phone]  [facility] [place] [place] - [address] AT [address] [place] [address] Phone: [phone] Fax: [facility] [place] [address] [place], [place] [address] Phone: [phone] Fax: [phone]  [facility] [address], [address] [place] [address] Phone: [phone] Fax: [phone]    Your procedure is scheduled on 08-22-2016   Thursday   Report to [facility] Admitting at 9:30 AM    Call this number if you have problems the morning of surgery:  [phone]   Remember:  Do not eat food or drink liquids after midnight.   Take these medicines the morning of surgery with A SIP OF WATER Alprazolam(Xanax) if needed,amlodipine(Norvasc),Baclofen(Lioresal) if needed,Bystolic,escitalopram(Lexapro),pain medication as needed,Valacyclovir(Valtrex) if needed    STOP ASPIRIN,ANTIINFLAMATORIES (IBUPROFEN,ALEVE,MOTRIN,ADVIL,GOODY'S POWDERS),HERBAL SUPPLEMENTS,FISH OIL,AND VITAMINS 5-7 DAYS PRIOR TO SURGERY   Do not wear jewelry, make-up or nail polish.  Do not wear lotions, powders, or perfumes, or deoderant.  Do not shave 48 hours prior to surgery.     Do not bring valuables to the hospital.  [facility] is not responsible for any belongings or valuables.  Contacts, dentures or bridgework may not be worn into surgery.  Leave your suitcase in the car.  After surgery it may be brought to your room.  For patients admitted to the hospital, discharge time will be determined by your treatment team.  Patients discharged the day of surgery will  not be allowed to drive home.    Special instructions:  See attached Sheet for instructions on CHG showers  Please read over the following fact sheets that you were given. Incentive Spirometry

## 2016-08-21 NOTE — H&P (Signed)
PREOPERATIVE H&P  Chief Complaint: Mid back pain  HPI: Debra Ruiz is a 71 y.o. female who presents with ongoing pain in the mid back  MRI reveals compression fractures, and patient has been diagnosed with multiple myeloma per Dr. Marin Olp  Patient has failed multiple forms of conservative care and continues to have pain (see office notes for additional details regarding the patient's full course of treatment)  Past Medical History:  Diagnosis Date  . Anemia   . Anxiety   . Arthritis   . Depression   . Dry eyes   . Hyperlipidemia   . Hypertension   . Insomnia   . Palpitations   . Vaginal dryness    Past Surgical History:  Procedure Laterality Date  . NO PAST SURGERIES     Social History   Social History  . Marital status: Single    Spouse name: N/A  . Number of children: N/A  . Years of education: N/A   Social History Main Topics  . Smoking status: Former Smoker    Types: Cigarettes    Quit date: 02/13/1985  . Smokeless tobacco: Never Used  . Alcohol use 12.5 oz/week    25 Standard drinks or equivalent per week  . Drug use: No  . Sexual activity: Not on file   Other Topics Concern  . Not on file   Social History Narrative  . No narrative on file   Family History  Problem Relation Age of Onset  . Hypertension Mother   . Stroke Mother   . Heart disease Father   . Heart disease Brother    Allergies  Allergen Reactions  . Crestor [Rosuvastatin Calcium]     Causes liver functions to be elevated   Prior to Admission medications   Medication Sig Start Date End Date Taking? Authorizing Provider  amLODipine (NORVASC) 5 MG tablet TAKE 1 TABLET EVERY DAY 03/02/16  Yes Elby Showers, MD  aspirin EC 81 MG tablet Take 81 mg by mouth daily.   Yes Historical Provider, MD  atorvastatin (LIPITOR) 20 MG tablet TAKE 1 TABLET EVERY DAY Patient taking differently: TAKE 1 TABLET EVERY EVENING 06/20/16  Yes Elby Showers, MD  baclofen (LIORESAL) 10 MG tablet Take  1 tablet (10 mg total) by mouth every 8 (eight) hours as needed for muscle spasms (Pain). 07/30/16  Yes Magnus Sinning, MD  escitalopram (LEXAPRO) 20 MG tablet TAKE 1 TABLET EVERY DAY 05/07/16  Yes Elby Showers, MD  hydrochlorothiazide (HYDRODIURIL) 25 MG tablet TAKE 1 TABLET EVERY DAY 06/20/16  Yes Elby Showers, MD  Multiple Vitamins-Minerals (CENTRUM SILVER PO) Take 1 tablet by mouth daily.   Yes Historical Provider, MD  nebivolol (BYSTOLIC) 5 MG tablet Take 5 mg by mouth daily.   Yes Historical Provider, MD  Omega-3 Fatty Acids (FISH OIL) 1000 MG CAPS Take 1,000 mg by mouth daily. WILL STOP PRIOR TO PROCEDURE   Yes Historical Provider, MD  oxycodone (OXY-IR) 5 MG capsule Take 5 mg by mouth every 4 (four) hours as needed for pain.    Yes Historical Provider, MD  oxyCODONE-acetaminophen (ROXICET) 5-325 MG tablet Take 1 tablet by mouth every 6 (six) hours as needed for severe pain. 07/09/16  Yes Elby Showers, MD  potassium chloride SA (K-DUR,KLOR-CON) 20 MEQ tablet TAKE 1 TABLET EVERY DAY 12/22/15  Yes Elby Showers, MD  valACYclovir (VALTREX) 1000 MG tablet Take 1,000 mg by mouth 2 (two) times daily as needed (FEVER BLISTERS).  Yes Historical Provider, MD  ALPRAZolam (XANAX) 0.25 MG tablet Take 1 tablet (0.25 mg total) by mouth 2 (two) times daily as needed for anxiety. 08/08/15   Elby Showers, MD  BYSTOLIC 5 MG tablet TAKE 1 TABLET EVERY DAY 02/27/16   Elby Showers, MD  predniSONE (DELTASONE) 10 MG tablet Take in tapering course as directed 6-5-4-3-2-1 q am with food 07/09/16   Elby Showers, MD     All other systems have been reviewed and were otherwise negative with the exception of those mentioned in the HPI and as above.  Physical Exam: There were no vitals filed for this visit.  General: Alert, no acute distress Cardiovascular: No pedal edema Respiratory: No cyanosis, no use of accessory musculature Skin: No lesions in the area of chief complaint Neurologic: Sensation intact  distally Psychiatric: Patient is competent for consent with normal mood and affect Lymphatic: No axillary or cervical lymphadenopathy  MUSCULOSKELETAL: + TTP at mid back  Assessment/Plan: T11, T12 compression fractures Plan for Procedure(s): THORACIC 11, THORACIC 12 KYPHOPLASTY, ILIAC CREST BONE BIOPSY   Sinclair Ship, MD 08/21/2016 1:49 PM

## 2016-08-22 ENCOUNTER — Ambulatory Visit (HOSPITAL_COMMUNITY): Payer: PPO | Admitting: Certified Registered"

## 2016-08-22 ENCOUNTER — Encounter (HOSPITAL_COMMUNITY)
Admission: RE | Disposition: A | Discharge status: Home / Self Care | Payer: Self-pay | Source: Ambulatory Visit | Attending: Orthopedic Surgery

## 2016-08-22 ENCOUNTER — Ambulatory Visit (HOSPITAL_COMMUNITY): Payer: PPO

## 2016-08-22 ENCOUNTER — Encounter (HOSPITAL_COMMUNITY): Payer: Self-pay | Admitting: *Deleted

## 2016-08-22 ENCOUNTER — Encounter: Payer: Self-pay | Admitting: Hematology & Oncology

## 2016-08-22 ENCOUNTER — Ambulatory Visit (HOSPITAL_COMMUNITY)
Admission: RE | Admit: 2016-08-22 | Discharge: 2016-08-22 | Disposition: A | Discharge status: Home / Self Care | Payer: PPO | Source: Ambulatory Visit | Attending: Orthopedic Surgery | Admitting: Orthopedic Surgery

## 2016-08-22 ENCOUNTER — Other Ambulatory Visit: Payer: Self-pay | Admitting: Hematology & Oncology

## 2016-08-22 DIAGNOSIS — C9000 Multiple myeloma not having achieved remission: Secondary | ICD-10-CM | POA: Insufficient documentation

## 2016-08-22 DIAGNOSIS — Z7982 Long term (current) use of aspirin: Secondary | ICD-10-CM | POA: Insufficient documentation

## 2016-08-22 DIAGNOSIS — I10 Essential (primary) hypertension: Secondary | ICD-10-CM | POA: Diagnosis not present

## 2016-08-22 DIAGNOSIS — E785 Hyperlipidemia, unspecified: Secondary | ICD-10-CM | POA: Insufficient documentation

## 2016-08-22 DIAGNOSIS — F329 Major depressive disorder, single episode, unspecified: Secondary | ICD-10-CM | POA: Insufficient documentation

## 2016-08-22 DIAGNOSIS — M4854XA Collapsed vertebra, not elsewhere classified, thoracic region, initial encounter for fracture: Secondary | ICD-10-CM | POA: Diagnosis not present

## 2016-08-22 DIAGNOSIS — K219 Gastro-esophageal reflux disease without esophagitis: Secondary | ICD-10-CM | POA: Diagnosis not present

## 2016-08-22 DIAGNOSIS — G4700 Insomnia, unspecified: Secondary | ICD-10-CM | POA: Insufficient documentation

## 2016-08-22 DIAGNOSIS — Z888 Allergy status to other drugs, medicaments and biological substances status: Secondary | ICD-10-CM | POA: Insufficient documentation

## 2016-08-22 DIAGNOSIS — Z462 Encounter for fitting and adjustment of other devices related to nervous system and special senses: Secondary | ICD-10-CM | POA: Diagnosis not present

## 2016-08-22 DIAGNOSIS — M1990 Unspecified osteoarthritis, unspecified site: Secondary | ICD-10-CM | POA: Diagnosis not present

## 2016-08-22 DIAGNOSIS — F419 Anxiety disorder, unspecified: Secondary | ICD-10-CM | POA: Diagnosis not present

## 2016-08-22 DIAGNOSIS — R011 Cardiac murmur, unspecified: Secondary | ICD-10-CM | POA: Insufficient documentation

## 2016-08-22 DIAGNOSIS — D630 Anemia in neoplastic disease: Secondary | ICD-10-CM | POA: Diagnosis not present

## 2016-08-22 DIAGNOSIS — S22080A Wedge compression fracture of T11-T12 vertebra, initial encounter for closed fracture: Secondary | ICD-10-CM | POA: Diagnosis not present

## 2016-08-22 DIAGNOSIS — H04129 Dry eye syndrome of unspecified lacrimal gland: Secondary | ICD-10-CM | POA: Insufficient documentation

## 2016-08-22 DIAGNOSIS — Z419 Encounter for procedure for purposes other than remedying health state, unspecified: Secondary | ICD-10-CM

## 2016-08-22 DIAGNOSIS — Z87891 Personal history of nicotine dependence: Secondary | ICD-10-CM | POA: Diagnosis not present

## 2016-08-22 DIAGNOSIS — S22000A Wedge compression fracture of unspecified thoracic vertebra, initial encounter for closed fracture: Secondary | ICD-10-CM

## 2016-08-22 DIAGNOSIS — D4989 Neoplasm of unspecified behavior of other specified sites: Secondary | ICD-10-CM | POA: Diagnosis not present

## 2016-08-22 HISTORY — DX: Multiple myeloma not having achieved remission: C90.00

## 2016-08-22 HISTORY — PX: KYPHOPLASTY: SHX5884

## 2016-08-22 LAB — PROTEIN ELECTROPHORESIS, SERUM, WITH REFLEX
A/G RATIO SPE: 0.6 — AB (ref 0.7–1.7)
ALBUMIN: 4 g/dL (ref 2.9–4.4)
ALPHA 2: 0.9 g/dL (ref 0.4–1.0)
Alpha 1: 0.4 g/dL (ref 0.0–0.4)
BETA: 1.1 g/dL (ref 0.7–1.3)
Gamma Globulin: 4.2 g/dL — ABNORMAL HIGH (ref 0.4–1.8)
Globulin, Total: 6.6 g/dL — ABNORMAL HIGH (ref 2.2–3.9)
Interpretation(See Below): 0
M-Spike, %: 4.1 g/dL — ABNORMAL HIGH
TOTAL PROTEIN: 10.6 g/dL — AB (ref 6.0–8.5)

## 2016-08-22 SURGERY — KYPHOPLASTY
Anesthesia: General | Site: Spine Thoracic

## 2016-08-22 MED ORDER — CEFAZOLIN SODIUM-DEXTROSE 2-4 GM/100ML-% IV SOLN
2.0000 g | INTRAVENOUS | Status: AC
  Administered 2016-08-22: 2 g via INTRAVENOUS

## 2016-08-22 MED ORDER — SUGAMMADEX SODIUM 200 MG/2ML IV SOLN
INTRAVENOUS | Status: DC | PRN
  Administered 2016-08-22: 110 mg via INTRAVENOUS

## 2016-08-22 MED ORDER — PHENYLEPHRINE HCL 10 MG/ML IJ SOLN
INTRAMUSCULAR | Status: DC | PRN
  Administered 2016-08-22 (×2): 40 ug via INTRAVENOUS
  Administered 2016-08-22: 120 ug via INTRAVENOUS
  Administered 2016-08-22: 160 ug via INTRAVENOUS
  Administered 2016-08-22: 40 ug via INTRAVENOUS

## 2016-08-22 MED ORDER — FENTANYL CITRATE (PF) 100 MCG/2ML IJ SOLN
INTRAMUSCULAR | Status: AC
  Filled 2016-08-22: qty 4

## 2016-08-22 MED ORDER — FENTANYL CITRATE (PF) 100 MCG/2ML IJ SOLN
INTRAMUSCULAR | Status: AC
  Filled 2016-08-22: qty 2

## 2016-08-22 MED ORDER — PROPOFOL 10 MG/ML IV BOLUS
INTRAVENOUS | Status: AC
  Filled 2016-08-22: qty 20

## 2016-08-22 MED ORDER — ONDANSETRON HCL 4 MG/2ML IJ SOLN
INTRAMUSCULAR | Status: AC
  Filled 2016-08-22: qty 2

## 2016-08-22 MED ORDER — POVIDONE-IODINE 7.5 % EX SOLN
Freq: Once | CUTANEOUS | Status: DC
  Filled 2016-08-22: qty 118

## 2016-08-22 MED ORDER — IOPAMIDOL (ISOVUE-300) INJECTION 61%
INTRAVENOUS | Status: DC | PRN
Start: 2016-08-22 — End: 2016-08-22
  Administered 2016-08-22: 50 mL
  Administered 2016-08-22: 50 mL via INTRAVENOUS

## 2016-08-22 MED ORDER — LIDOCAINE 2% (20 MG/ML) 5 ML SYRINGE
INTRAMUSCULAR | Status: AC
  Filled 2016-08-22: qty 5

## 2016-08-22 MED ORDER — PROMETHAZINE HCL 25 MG/ML IJ SOLN
6.2500 mg | INTRAMUSCULAR | Status: DC | PRN
Start: 2016-08-22 — End: 2016-08-22

## 2016-08-22 MED ORDER — BACITRACIN ZINC 500 UNIT/GM EX OINT
TOPICAL_OINTMENT | CUTANEOUS | Status: AC
  Filled 2016-08-22: qty 28.35

## 2016-08-22 MED ORDER — PHENYLEPHRINE HCL 10 MG/ML IJ SOLN
INTRAVENOUS | Status: DC | PRN
  Administered 2016-08-22: 30 ug/min via INTRAVENOUS

## 2016-08-22 MED ORDER — BUPIVACAINE HCL (PF) 0.25 % IJ SOLN
INTRAMUSCULAR | Status: AC
  Filled 2016-08-22: qty 30

## 2016-08-22 MED ORDER — LACTATED RINGERS IV SOLN
INTRAVENOUS | Status: DC
  Administered 2016-08-22 (×2): via INTRAVENOUS

## 2016-08-22 MED ORDER — OXYCODONE HCL 5 MG PO TABS: 5.0000 mg | ORAL_TABLET | Freq: Once | ORAL | Status: DC | PRN

## 2016-08-22 MED ORDER — HEPARIN SODIUM (PORCINE) 1000 UNIT/ML IJ SOLN
INTRAMUSCULAR | Status: DC | PRN
  Administered 2016-08-22: 1500 [IU]

## 2016-08-22 MED ORDER — FENTANYL CITRATE (PF) 100 MCG/2ML IJ SOLN
INTRAMUSCULAR | Status: DC | PRN
  Administered 2016-08-22: 50 ug via INTRAVENOUS
  Administered 2016-08-22: 25 ug via INTRAVENOUS
  Administered 2016-08-22: 100 ug via INTRAVENOUS
  Administered 2016-08-22: 25 ug via INTRAVENOUS
  Administered 2016-08-22: 50 ug via INTRAVENOUS

## 2016-08-22 MED ORDER — IOPAMIDOL (ISOVUE-300) INJECTION 61%
INTRAVENOUS | Status: AC
  Filled 2016-08-22: qty 50

## 2016-08-22 MED ORDER — PROPOFOL 10 MG/ML IV BOLUS
INTRAVENOUS | Status: DC | PRN
  Administered 2016-08-22: 120 mg via INTRAVENOUS
  Administered 2016-08-22: 20 mg via INTRAVENOUS

## 2016-08-22 MED ORDER — LIDOCAINE HCL (CARDIAC) 20 MG/ML IV SOLN
INTRAVENOUS | Status: DC | PRN
  Administered 2016-08-22: 100 mg via INTRAVENOUS

## 2016-08-22 MED ORDER — HYDROMORPHONE HCL 1 MG/ML IJ SOLN
0.2500 mg | INTRAMUSCULAR | Status: DC | PRN
Start: 2016-08-22 — End: 2016-08-22

## 2016-08-22 MED ORDER — MEPERIDINE HCL 25 MG/ML IJ SOLN: 6.2500 mg | INTRAMUSCULAR | Status: DC | PRN

## 2016-08-22 MED ORDER — ALBUTEROL SULFATE HFA 108 (90 BASE) MCG/ACT IN AERS
INHALATION_SPRAY | RESPIRATORY_TRACT | Status: DC | PRN
  Administered 2016-08-22 (×2): 2 via RESPIRATORY_TRACT

## 2016-08-22 MED ORDER — CEFAZOLIN SODIUM-DEXTROSE 2-4 GM/100ML-% IV SOLN
INTRAVENOUS | Status: AC
  Filled 2016-08-22: qty 100

## 2016-08-22 MED ORDER — OXYCODONE HCL 5 MG/5ML PO SOLN: 5.0000 mg | Freq: Once | ORAL | Status: DC | PRN

## 2016-08-22 MED ORDER — ROCURONIUM BROMIDE 50 MG/5ML IV SOSY
PREFILLED_SYRINGE | INTRAVENOUS | Status: AC
  Filled 2016-08-22: qty 5

## 2016-08-22 MED ORDER — ONDANSETRON HCL 4 MG/2ML IJ SOLN
INTRAMUSCULAR | Status: DC | PRN
  Administered 2016-08-22: 4 mg via INTRAVENOUS

## 2016-08-22 MED ORDER — ROCURONIUM BROMIDE 100 MG/10ML IV SOLN
INTRAVENOUS | Status: DC | PRN
  Administered 2016-08-22: 40 mg via INTRAVENOUS

## 2016-08-22 MED ORDER — EPINEPHRINE PF 1 MG/ML IJ SOLN
INTRAMUSCULAR | Status: AC
  Filled 2016-08-22: qty 1

## 2016-08-22 MED ORDER — SUGAMMADEX SODIUM 200 MG/2ML IV SOLN
INTRAVENOUS | Status: AC
  Filled 2016-08-22: qty 2

## 2016-08-22 SURGICAL SUPPLY — 48 items
BLADE SURG 15 STRL LF DISP TIS (BLADE) ×1 IMPLANT
BLADE SURG 15 STRL SS (BLADE) ×1
CEMENT KYPHON C01A KIT/MIXER (Cement) ×2 IMPLANT
COVER MAYO STAND STRL (DRAPES) ×2 IMPLANT
COVER SURGICAL LIGHT HANDLE (MISCELLANEOUS) ×2 IMPLANT
CURETTE EXPRESS SZ2 7MM (INSTRUMENTS) ×1 IMPLANT
CURRETTE EXPRESS SZ2 7MM (INSTRUMENTS) ×2
DRAPE C-ARM 42X72 X-RAY (DRAPES) ×2 IMPLANT
DRAPE INCISE IOBAN 66X45 STRL (DRAPES) ×2 IMPLANT
DRAPE LAPAROTOMY T 102X78X121 (DRAPES) ×2 IMPLANT
DRAPE PROXIMA HALF (DRAPES) IMPLANT
DRAPE SURG 17X23 STRL (DRAPES) ×8 IMPLANT
DURAPREP 26ML APPLICATOR (WOUND CARE) ×2 IMPLANT
FILTER STRAW FLUID ASPIR (MISCELLANEOUS) ×2 IMPLANT
GAUZE SPONGE 2X2 8PLY STRL LF (GAUZE/BANDAGES/DRESSINGS) ×1 IMPLANT
GAUZE SPONGE 4X4 16PLY XRAY LF (GAUZE/BANDAGES/DRESSINGS) ×4 IMPLANT
GLOVE BIO SURGEON STRL SZ7 (GLOVE) ×2 IMPLANT
GLOVE BIO SURGEON STRL SZ8 (GLOVE) ×2 IMPLANT
GLOVE BIOGEL PI IND STRL 7.0 (GLOVE) ×1 IMPLANT
GLOVE BIOGEL PI IND STRL 8 (GLOVE) ×1 IMPLANT
GLOVE BIOGEL PI INDICATOR 7.0 (GLOVE) ×1
GLOVE BIOGEL PI INDICATOR 8 (GLOVE) ×1
GOWN STRL REUS W/ TWL LRG LVL3 (GOWN DISPOSABLE) ×2 IMPLANT
GOWN STRL REUS W/ TWL XL LVL3 (GOWN DISPOSABLE) ×1 IMPLANT
GOWN STRL REUS W/TWL LRG LVL3 (GOWN DISPOSABLE) ×2
GOWN STRL REUS W/TWL XL LVL3 (GOWN DISPOSABLE) ×1
KIT BASIN OR (CUSTOM PROCEDURE TRAY) ×2 IMPLANT
KIT ROOM TURNOVER OR (KITS) ×2 IMPLANT
NEEDLE 18GX1X1/2 (RX/OR ONLY) (NEEDLE) ×4 IMPLANT
NEEDLE 22X1 1/2 (OR ONLY) (NEEDLE) IMPLANT
NEEDLE HYPO 25X1 1.5 SAFETY (NEEDLE) IMPLANT
NEEDLE SPNL 18GX3.5 QUINCKE PK (NEEDLE) ×4 IMPLANT
NS IRRIG 1000ML POUR BTL (IV SOLUTION) ×2 IMPLANT
PACK SURGICAL SETUP 50X90 (CUSTOM PROCEDURE TRAY) ×2 IMPLANT
PACK UNIVERSAL I (CUSTOM PROCEDURE TRAY) ×2 IMPLANT
PAD ARMBOARD 7.5X6 YLW CONV (MISCELLANEOUS) ×4 IMPLANT
POSITIONER HEAD PRONE TRACH (MISCELLANEOUS) ×2 IMPLANT
SPONGE GAUZE 2X2 STER 10/PKG (GAUZE/BANDAGES/DRESSINGS) ×1
SUT MNCRL AB 4-0 PS2 18 (SUTURE) ×2 IMPLANT
SYR 5ML LUER SLIP (SYRINGE) ×4 IMPLANT
SYR BULB IRRIGATION 50ML (SYRINGE) ×2 IMPLANT
SYR CONTROL 10ML LL (SYRINGE) ×2 IMPLANT
SYR TB 1ML LUER SLIP (SYRINGE) ×2 IMPLANT
TAPE CLOTH SURG 4X10 WHT LF (GAUZE/BANDAGES/DRESSINGS) ×2 IMPLANT
TOWEL OR 17X24 6PK STRL BLUE (TOWEL DISPOSABLE) ×2 IMPLANT
TOWEL OR 17X26 10 PK STRL BLUE (TOWEL DISPOSABLE) ×2 IMPLANT
TRAY KYPHOPAK 15/3 ONESTEP 1ST (MISCELLANEOUS) ×2 IMPLANT
TRAY KYPHOPAK 20/3 ONESTEP 1ST (MISCELLANEOUS) ×2 IMPLANT

## 2016-08-22 NOTE — Anesthesia Procedure Notes (Signed)
Procedure Name: Intubation Date/Time: 08/22/2016 11:28 AM Performed by: Lance Coon Pre-anesthesia Checklist: Patient identified, Emergency Drugs available, Suction available, Patient being monitored and Timeout performed Patient Re-evaluated:Patient Re-evaluated prior to inductionOxygen Delivery Method: Circle system utilized Preoxygenation: Pre-oxygenation with 100% oxygen Intubation Type: IV induction Ventilation: Mask ventilation without difficulty Laryngoscope Size: Miller and 3 Grade View: Grade I Tube type: Oral Tube size: 7.0 mm Number of attempts: 1 Airway Equipment and Method: Stylet Placement Confirmation: ETT inserted through vocal cords under direct vision,  positive ETCO2 and breath sounds checked- equal and bilateral Secured at: 21 cm Tube secured with: Tape Dental Injury: Teeth and Oropharynx as per pre-operative assessment

## 2016-08-22 NOTE — Anesthesia Postprocedure Evaluation (Signed)
Anesthesia Post Note  Patient: Debra Ruiz  Procedure(s) Performed: Procedure(s) (LRB): THORACIC 11, THORACIC 12 KYPHOPLASTY (N/A)  Patient location during evaluation: PACU Anesthesia Type: General Level of consciousness: sedated and patient cooperative Pain management: pain level controlled Vital Signs Assessment: post-procedure vital signs reviewed and stable Respiratory status: spontaneous breathing Cardiovascular status: stable Anesthetic complications: no       Last Vitals:  Vitals:   08/22/16 1430 08/22/16 1500  BP:  115/62  Pulse:  86  Resp:  18  Temp: 36.3 C     Last Pain:  Vitals:   08/22/16 1500  TempSrc:   PainSc: 0-No pain                 Nolon Nations

## 2016-08-22 NOTE — Anesthesia Preprocedure Evaluation (Signed)
Anesthesia Evaluation  Patient identified by MRN, date of birth, ID band Patient awake    Reviewed: Allergy & Precautions, NPO status , Patient's Chart, lab work & pertinent test results, reviewed documented beta blocker date and time   Airway Mallampati: II  TM Distance: >3 FB Neck ROM: Full    Dental no notable dental hx.    Pulmonary neg pulmonary ROS, former smoker,    Pulmonary exam normal breath sounds clear to auscultation       Cardiovascular hypertension, Pt. on medications and Pt. on home beta blockers  Rhythm:Regular Rate:Normal + Systolic murmurs    Neuro/Psych PSYCHIATRIC DISORDERS Anxiety Depression negative neurological ROS     GI/Hepatic negative GI ROS, Neg liver ROS, GERD  ,  Endo/Other  negative endocrine ROS  Renal/GU negative Renal ROS     Musculoskeletal  (+) Arthritis ,   Abdominal   Peds  Hematology  (+) anemia ,   Anesthesia Other Findings   Reproductive/Obstetrics negative OB ROS                             Anesthesia Physical Anesthesia Plan  ASA: II  Anesthesia Plan: General   Post-op Pain Management:    Induction: Intravenous  Airway Management Planned: Oral ETT  Additional Equipment:   Intra-op Plan:   Post-operative Plan: Extubation in OR  Informed Consent: I have reviewed the patients History and Physical, chart, labs and discussed the procedure including the risks, benefits and alternatives for the proposed anesthesia with the patient or authorized representative who has indicated his/her understanding and acceptance.   Dental advisory given  Plan Discussed with: CRNA  Anesthesia Plan Comments:         Anesthesia Quick Evaluation

## 2016-08-22 NOTE — Transfer of Care (Signed)
Immediate Anesthesia Transfer of Care Note  Patient: Debra Ruiz  Procedure(s) Performed: Procedure(s) with comments: THORACIC 11, THORACIC 12 KYPHOPLASTY (N/A) - THORACIC 11, THORACIC 12 KYPHOPLASTY  Patient Location: PACU  Anesthesia Type:General  Level of Consciousness: awake and patient cooperative  Airway & Oxygen Therapy: Patient Spontanous Breathing and Patient connected to face mask oxygen  Post-op Assessment: Report given to RN and Post -op Vital signs reviewed and stable  Post vital signs: Reviewed and stable  Last Vitals:  Vitals:   08/22/16 0942 08/22/16 1315  BP: (!) 145/59   Pulse: 93   Resp: 18   Temp: 37 C (P) 36.5 C    Last Pain:  Vitals:   08/22/16 0942  TempSrc: Oral      Patients Stated Pain Goal: 4 ([phone] [id number])  Complications: No apparent anesthesia complications

## 2016-08-23 NOTE — Telephone Encounter (Signed)
Patient was referred to another physician to evaluate for kyphoplasty.

## 2016-08-23 NOTE — Op Note (Signed)
Debra Ruiz, Debra Ruiz                ACCOUNT NO.:  [id number]  MEDICAL RECORD NO.:  [id number]  LOCATION:                                 FACILITY:  PHYSICIAN:  Phylliss Bob, MD      DATE OF BIRTH:  [birth year]  DATE OF PROCEDURE:  08/22/2016                              OPERATIVE REPORT   PREOPERATIVE DIAGNOSES: 1. Compression fracture T11, T12. 2. New diagnosis of multiple myeloma.  POSTOPERATIVE DIAGNOSES: 1. Compression fracture T11, T12. 2. New diagnosis of multiple myeloma.  PROCEDURE: 1. Kyphoplasty, T11, T12. 2. Core biopsy and bone marrow aspirate of the T12 vertebral body.     Per Dr. Marin Olp request, the tissue was sent to pathology for flow     cytometry, cytogenetics, and FISH panel for myeloma.  SURGEON:  Phylliss Bob, MD  ASSISTANT:  Pricilla Holm, PA-C.  ANESTHESIA:  General endotracheal anesthesia.  COMPLICATIONS:  None.  DISPOSITION:  Stable.  ESTIMATED BLOOD LOSS:  Minimal.  INDICATIONS FOR SURGERY:  Briefly, Debra Ruiz is a pleasant 71 year old female, who did present to me with severe and rather debilitating pain in her low back.  A recent MRI was notable for edema involving the T12 and T11 vertebral bodies.  There was significant height loss anteriorly involving the T12 vertebral body.  There was significant edema throughout the T11 vertebral body.  Also of note, the patient's MRI was highly suggestive of multiple myeloma.  The patient was sent to Dr. Marin Olp for additional workup for what was considered to be likely multiple myeloma.  I did also discuss with the patient the risks and benefits of a kyphoplasty procedure.  The patient was fully aware that any pain related to her compression fractures would be likely to be resolved with the procedure.  She did also understand any pain not related to the compression fracture would not be alleviated with the surgery.  The patient did elect to proceed.  OPERATIVE DETAILS:  On August 22, 2016, the  patient was brought to surgery and general endotracheal anesthesia was administered.  The patient was placed prone on a well-padded flat Jackson bed.  Gel was replaced under the patient's chest and hips.  Antibiotics were given. Small stab incisions were made lateral to the T11 and T12 pedicles bilaterally.  Jamshidi needles were advanced across the T11 and T12 pedicles, liberally using AP and lateral fluoroscopy.  Of note, there was very little resistance when advancing the Jamshidi across the T11 pedicles into the T11 vertebral body.  A normal amount of resistance was identified at T12 bilaterally.  Through the left pedicle of T12, I did obtain a core biopsy, and I did obtain approximately 5 mL of bone marrow aspirate.  Of note, there was significant amount of oozing, and I did suspect that a substantial portion of the aspirate was blood from within the vertebral body.  However, a core biopsy with approximately 2-3 mL of vertebral body bone was removed and sent to pathology for the studies outlined above.  I then inserted kyphoplasty balloons bilaterally into the T11 and T12 vertebral bodies.  The balloons in the T11 vertebral body were inflated with approximately 3 mL of  contrast bilaterally.  The vertebral body balloons at T11 were only minimally inflated with high pressures noted early on.  The balloons were then deflated and removed. I then introduced approximately 5 mL of cement into the T11 vertebral body.  Excellent interdigitation of cement was noted.  At the T12 vertebral body, approximately 2 mL of cement was introduced into the vertebral body.  A portion of the vertebral body cement did begin to advance the upper and lower intervertebral discs.  I did allow the cement to harden.  I did not feel that there would be any gain with introducing additional cement into the T12 vertebral body.  The cement was then allowed to harden.  The wound was then copiously irrigated.  Of note,  there was no worrisome extravasation of cement into the spinal canal or anteriorly into the retroperitoneal space.  The wound was then irrigated.  The wound was then closed using 4-0 Monocryl times all 4 stab incisions.  All instrument counts were correct at the termination of the procedure.  Of note, Pricilla Holm was my assistant throughout surgery.     Phylliss Bob, MD   ______________________________ Phylliss Bob, MD    MD/MEDQ  D:  08/22/2016  T:  08/23/2016  Job:  [id number]  cc:   [name], M.D.

## 2016-08-26 ENCOUNTER — Encounter (INDEPENDENT_AMBULATORY_CARE_PROVIDER_SITE_OTHER): Payer: Self-pay | Admitting: Physical Medicine and Rehabilitation

## 2016-08-26 NOTE — Telephone Encounter (Signed)
Phone call to pt. Has seen Dr. Marin Olp. Has been diagnosed with Myeloma and will be starting treatment soon. Having considerable back pain. Has pain meds. Has Xanax for anxiety.

## 2016-08-27 ENCOUNTER — Other Ambulatory Visit: Payer: Self-pay | Admitting: *Deleted

## 2016-08-27 DIAGNOSIS — C9000 Multiple myeloma not having achieved remission: Secondary | ICD-10-CM

## 2016-08-28 ENCOUNTER — Encounter: Payer: Self-pay | Admitting: Hematology & Oncology

## 2016-08-28 ENCOUNTER — Encounter: Payer: Self-pay | Admitting: Radiation Oncology

## 2016-08-28 ENCOUNTER — Ambulatory Visit (HOSPITAL_BASED_OUTPATIENT_CLINIC_OR_DEPARTMENT_OTHER): Payer: PPO | Admitting: Hematology & Oncology

## 2016-08-28 ENCOUNTER — Other Ambulatory Visit: Payer: PPO

## 2016-08-28 VITALS — BP 111/51 | Temp 98.2°F | Resp 20

## 2016-08-28 DIAGNOSIS — R898 Other abnormal findings in specimens from other organs, systems and tissues: Secondary | ICD-10-CM | POA: Diagnosis not present

## 2016-08-28 DIAGNOSIS — Z7189 Other specified counseling: Secondary | ICD-10-CM

## 2016-08-28 DIAGNOSIS — C9000 Multiple myeloma not having achieved remission: Secondary | ICD-10-CM | POA: Diagnosis not present

## 2016-08-28 HISTORY — DX: Other specified counseling: Z71.89

## 2016-08-28 MED ORDER — FAMCICLOVIR 250 MG PO TABS: 250.0000 mg | ORAL_TABLET | Freq: Every day | ORAL | 12 refills | Status: DC

## 2016-08-28 MED ORDER — DEXAMETHASONE 4 MG PO TABS: ORAL_TABLET | ORAL | 3 refills | Status: DC

## 2016-08-28 MED ORDER — FENTANYL 12 MCG/HR TD PT72: 12.5000 ug | MEDICATED_PATCH | TRANSDERMAL | 0 refills | Status: DC

## 2016-08-28 MED ORDER — LENALIDOMIDE 25 MG PO CAPS: ORAL_CAPSULE | ORAL | 4 refills | Status: DC

## 2016-08-28 NOTE — Progress Notes (Signed)
START ON PATHWAY REGIMEN - Multiple Myeloma  [id number]: VRd (Bortezomib 1.3 mg/m2 Subcut D1, 8, 15 + Lenalidomide 25 mg + Dexamethasone 40 mg) q21 Days x 8 Cycles   A cycle is every 21 days:     Bortezomib (Velcade(R)) 1.3 mg/m2 administer subcut days 1, 8, 15 Dose Mod: None     Lenalidomide (Revlimid(R)) 25 mg orally daily days 1 through 14 Dose Mod: None     Dexamethasone (Decadron(R)) 40 mg orally once a week days 1, 8, 15 Dose Mod: None Additional Orders: Herpes zoster prophylaxis recommended. Antithrombotic therapy: aspirin 81-325 mg PO daily for patients at low risk; enoxaparin 40 mg subcut daily or warfarin to an INR of 2-3 for patients at higher risk.  **Always confirm dose/schedule in your pharmacy ordering system**    Patient Characteristics: Newly Diagnosed, Transplant Ineligible or Refused, High Risk R-ISS Staging: III Disease Classification: Newly Diagnosed Is Patient Eligible for Transplant? Transplant Ineligible or Refused Risk Status: High Risk  Intent of Therapy: Curative Intent, Discussed with Patient

## 2016-08-28 NOTE — Progress Notes (Addendum)
Hematology and Oncology Follow Up Visit  Debra Ruiz [id number] 1945/08/30 71 y.o. 08/28/2016   Principle Diagnosis:   IgG Kappa myeloma - compression fractures - 13q-, +11, 14q+  Current Therapy:    S/p Kyphoplasty of T11 and T12  Velcade/Revlimid/Decadron - start 09/04/2016  XRT to lower spine  Xgeva 120mg  Sq q month     Interim History:  Debra Ruiz is back for follow-up. We now have a confirmed diagnosis for her. She definitely has myeloma. She has IgG kappa myeloma. I did go ahead and get lab studies on her when she was here initially. Her M spike is 4.1 g/dL. Her IgG level is 5700 mg/dL. Her Kappa Light chain 626 mg/L.  She had a bone survey done. Shows multiple bones with lytic lesions.  She did have a kyphoplasty done. This was done on February 1. At the time, biopsies were done. The pathology report [id number]) shows plasma cell neoplasm consistent with myeloma.  I really thought that the kyphoplasty would help. Unfortunately, it has not. She comes in a wheelchair. She is having a hard time getting around because of pain. I think she definitely is going to need radiation therapy to the spine. I have spoken with Dr. Sondra Come of radiation oncology. He will get her in for an evaluation.  She definitely needs systemic therapy. I think that she will be a good candidate for Revlimid/Velcade/Decadron. Now that the FDA has approved Xgeva for myeloma, we can do Xgeva. This way, she will not need any IVs.  I just feel bad that she is in such pain. I think a Duragesic patch may help her out. I talked to her and her husband about this. We will start her on a 12.5 g patch. Hopefully this will help with some of the discomfort until she get radiation and allow systemic therapy to help.  She is not having any problems with fever. She's having no rashes. She's having no nausea or vomiting. Her appetite is not that great because she hurts.  She has not had any headache.  Overall, her  performance status is ECOG 2-3  Medications:  Current Outpatient Prescriptions:  .  ALPRAZolam (XANAX) 0.25 MG tablet, Take 1 tablet (0.25 mg total) by mouth 2 (two) times daily as needed for anxiety., Disp: 60 tablet, Rfl: 0 .  amLODipine (NORVASC) 5 MG tablet, TAKE 1 TABLET EVERY DAY, Disp: 90 tablet, Rfl: 3 .  atorvastatin (LIPITOR) 20 MG tablet, TAKE 1 TABLET EVERY DAY (Patient taking differently: TAKE 1 TABLET EVERY EVENING), Disp: 90 tablet, Rfl: 1 .  baclofen (LIORESAL) 10 MG tablet, Take 1 tablet (10 mg total) by mouth every 8 (eight) hours as needed for muscle spasms (Pain)., Disp: 60 tablet, Rfl: 0 .  diazepam (VALIUM) 5 MG tablet, 5 mg., Disp: , Rfl: 1 .  escitalopram (LEXAPRO) 20 MG tablet, TAKE 1 TABLET EVERY DAY, Disp: 90 tablet, Rfl: 2 .  hydrochlorothiazide (HYDRODIURIL) 25 MG tablet, TAKE 1 TABLET EVERY DAY, Disp: 90 tablet, Rfl: 2 .  Multiple Vitamins-Minerals (CENTRUM SILVER PO), Take 1 tablet by mouth daily., Disp: , Rfl:  .  nebivolol (BYSTOLIC) 5 MG tablet, Take 5 mg by mouth daily., Disp: , Rfl:  .  oxycodone (OXY-IR) 5 MG capsule, Take 5 mg by mouth every 4 (four) hours as needed for pain. , Disp: , Rfl:  .  oxyCODONE-acetaminophen (ROXICET) 5-325 MG tablet, Take 1 tablet by mouth every 6 (six) hours as needed for severe pain., Disp: 60 tablet,  Rfl: 0 .  potassium chloride SA (K-DUR,KLOR-CON) 20 MEQ tablet, TAKE 1 TABLET EVERY DAY, Disp: 90 tablet, Rfl: 1 .  predniSONE (DELTASONE) 10 MG tablet, Take in tapering course as directed 6-5-4-3-2-1 q am with food, Disp: 21 tablet, Rfl: 0 .  valACYclovir (VALTREX) 1000 MG tablet, Take 1,000 mg by mouth 2 (two) times daily as needed (FEVER BLISTERS). , Disp: , Rfl:  .  dexamethasone (DECADRON) 4 MG tablet, Take 5 pills with food once a week, Disp: 60 tablet, Rfl: 3 .  famciclovir (FAMVIR) 250 MG tablet, Take 1 tablet (250 mg total) by mouth daily., Disp: 30 tablet, Rfl: 12 .  fentaNYL (DURAGESIC) 12 MCG/HR, Place 1 patch (12.5 mcg  total) onto the skin every 3 (three) days., Disp: 10 patch, Rfl: 0 .  lenalidomide (REVLIMID) 25 MG capsule, Take 1 capsule daily for 21 days and then off for 7 days, Disp: 21 capsule, Rfl: 4  Allergies:  Allergies  Allergen Reactions  . Crestor [Rosuvastatin Calcium]     Causes liver functions to be elevated    Past Medical History, Surgical history, Social history, and Family History were reviewed and updated.  Review of Systems: As above  Physical Exam:  temperature is 98.2 F (36.8 C). Her blood pressure is 111/51 (abnormal). Her respiration is 20.   Wt Readings from Last 3 Encounters:  08/20/16 118 lb 11.2 oz (53.8 kg)  08/19/16 120 lb (54.4 kg)  07/09/16 125 lb (56.7 kg)     Elderly appearing white female. She is in moderate distress secondary to pain. Her headache exam shows no ocular or oral lesions. She has no palpable cervical or supraclavicular lymph nodes. Lungs are clear bilaterally. Cardiac exam regular rate and rhythm with no murmurs, rubs or bruits. Abdomen is soft. She has good bowel sounds. There is no fluid wave. There is no palpable liver or spleen tip. Back exam shows some slight kyphosis. She has tenderness in the lower thoracic and lumbosacral spine. There may be some slight muscle spasms noted in the paravertebral area. Extremities shows no clubbing, cyanosis or edema. She has decent range of motion of her joints. She has 4/5 strength. Skin exam shows no rashes, ecchymoses or petechia. Neurological exam shows no focal neurological deficits.  Lab Results  Component Value Date   WBC 6.1 08/19/2016   HGB 10.6 (L) 08/19/2016   HCT 32.2 (L) 08/19/2016   MCV 94 08/19/2016   PLT 189 08/19/2016     Chemistry      Component Value Date/Time   NA 140 08/19/2016 1106   K 3.6 08/19/2016 1106   CL 96 (L) 08/19/2016 1106   CO2 30 08/19/2016 1106   BUN 11 08/19/2016 1106   CREATININE 0.6 08/19/2016 1106      Component Value Date/Time   CALCIUM 10.9 (H)  08/19/2016 1106   ALKPHOS 75 08/19/2016 1106   AST 40 (H) 08/19/2016 1106   ALT 34 08/19/2016 1106   BILITOT 0.60 08/19/2016 1106         Impression and Plan: Debra Ruiz is a 71 year old white female with IgG kappa myeloma. This is a new diagnosis.  Currently, I would not consider her transplant eligible because of her performance status.  I think that she would be a good candidate for Revlimid/Velcade/Decadron.  Of note, we do not have the cytogenetics back yet on the bone marrow. This will be important.  I spent about 45 minutes with she and her husband. I gave her information sheets  about the medications that we use. I told her to start taking aspirin 325 mg daily with food. I also gave her a prescription for Famvir (250 mg by mouth daily) to help with shingles prevention.  She lives close to the [facility]. We'll try to get her treatments done there. It would be a lot easier for her.  I don't see any problems with her having concurrent radiation therapy.  I also spoke to her about our goals of care. I told her that we are dealing with a situation that is not curable at this point. Our goal is to make her more functional and to give her better quality of life. I feel confident that we can do this. It may take several weeks before we can get her to that point. I think if we can just get her back pain under better control, then we will be able to accomplish our primary goal.  I have not totally ruled out doing a bone marrow transplant on her. However, she just is not in good enough shape right now.  The understanding fairy well our goals of care. She just wants to feel better and be more functional.  We will try to get treatment started at the [facility] next week. I will plan to see her back in 3 weeks or so.   [name], MD 2/7/201811:46 AM

## 2016-08-29 ENCOUNTER — Telehealth: Payer: Self-pay

## 2016-08-29 ENCOUNTER — Telehealth: Payer: Self-pay | Admitting: *Deleted

## 2016-08-29 ENCOUNTER — Encounter (HOSPITAL_COMMUNITY): Payer: Self-pay | Admitting: Orthopedic Surgery

## 2016-08-29 LAB — UIFE/LIGHT CHAINS/TP QN, 24-HR UR
FR KAPPA LT CH,24HR: 3140 mg/24 hr
FR LAMBDA LT CH,24HR: 0 mg/24 hr
Free Kappa Lt Chains,Ur: 2730 mg/L — ABNORMAL HIGH (ref 1.35–24.19)
Free Lambda Lt Chains,Ur: 0.39 mg/L (ref 0.24–6.66)
Kappa/Lambda Ratio,U: 7000 — ABNORMAL HIGH (ref 2.04–10.37)
PROTEIN 24H UR: 405 mg/(24.h) — AB (ref 30–150)
PROTEIN UR: 35.2 mg/dL

## 2016-08-29 NOTE — Telephone Encounter (Signed)
Per 2/8 staff message from the [facility] office , I have scheduled appts. Notified the scheduler

## 2016-08-29 NOTE — Telephone Encounter (Signed)
Received call from [facility] stating that pt's copay is >$3000/month. States pt may be eligible for assistance through the leukemia/lymphoma society but their current application process is taking 2-3 weeks to turn around. Questions if we wish for them to proceed with processing. Advised to proceed. Debra Ruiz will contact the patient to gain application information. dph

## 2016-08-30 DIAGNOSIS — M545 Low back pain: Secondary | ICD-10-CM | POA: Diagnosis not present

## 2016-08-30 LAB — TISSUE HYBRIDIZATION (BONE MARROW)-NCBH

## 2016-09-02 ENCOUNTER — Encounter: Payer: Self-pay | Admitting: Radiation Oncology

## 2016-09-02 ENCOUNTER — Ambulatory Visit
Admission: RE | Admit: 2016-09-02 | Discharge: 2016-09-02 | Disposition: A | Discharge status: Home / Self Care | Payer: PPO | Source: Ambulatory Visit | Attending: Radiation Oncology | Admitting: Radiation Oncology

## 2016-09-02 ENCOUNTER — Other Ambulatory Visit: Payer: Self-pay | Admitting: *Deleted

## 2016-09-02 ENCOUNTER — Ambulatory Visit: Payer: PPO

## 2016-09-02 VITALS — BP 153/65 | HR 105 | Temp 97.9°F | Ht 61.0 in

## 2016-09-02 DIAGNOSIS — F329 Major depressive disorder, single episode, unspecified: Secondary | ICD-10-CM | POA: Diagnosis not present

## 2016-09-02 DIAGNOSIS — I700 Atherosclerosis of aorta: Secondary | ICD-10-CM | POA: Diagnosis not present

## 2016-09-02 DIAGNOSIS — I10 Essential (primary) hypertension: Secondary | ICD-10-CM | POA: Insufficient documentation

## 2016-09-02 DIAGNOSIS — M2578 Osteophyte, vertebrae: Secondary | ICD-10-CM | POA: Diagnosis not present

## 2016-09-02 DIAGNOSIS — M4316 Spondylolisthesis, lumbar region: Secondary | ICD-10-CM | POA: Diagnosis not present

## 2016-09-02 DIAGNOSIS — M48061 Spinal stenosis, lumbar region without neurogenic claudication: Secondary | ICD-10-CM | POA: Diagnosis not present

## 2016-09-02 DIAGNOSIS — C9000 Multiple myeloma not having achieved remission: Secondary | ICD-10-CM

## 2016-09-02 DIAGNOSIS — Z87891 Personal history of nicotine dependence: Secondary | ICD-10-CM | POA: Insufficient documentation

## 2016-09-02 DIAGNOSIS — F419 Anxiety disorder, unspecified: Secondary | ICD-10-CM | POA: Diagnosis not present

## 2016-09-02 DIAGNOSIS — M1990 Unspecified osteoarthritis, unspecified site: Secondary | ICD-10-CM | POA: Diagnosis not present

## 2016-09-02 DIAGNOSIS — M4854XA Collapsed vertebra, not elsewhere classified, thoracic region, initial encounter for fracture: Secondary | ICD-10-CM | POA: Diagnosis not present

## 2016-09-02 DIAGNOSIS — E785 Hyperlipidemia, unspecified: Secondary | ICD-10-CM | POA: Diagnosis not present

## 2016-09-02 DIAGNOSIS — M4850XA Collapsed vertebra, not elsewhere classified, site unspecified, initial encounter for fracture: Secondary | ICD-10-CM | POA: Diagnosis not present

## 2016-09-02 DIAGNOSIS — G893 Neoplasm related pain (acute) (chronic): Secondary | ICD-10-CM | POA: Diagnosis not present

## 2016-09-02 DIAGNOSIS — C7951 Secondary malignant neoplasm of bone: Secondary | ICD-10-CM | POA: Diagnosis not present

## 2016-09-02 LAB — CHROMOSOME ANALYSIS, BONE MARROW

## 2016-09-02 MED ORDER — OXYCODONE HCL 5 MG PO CAPS: 5.0000 mg | ORAL_CAPSULE | ORAL | 0 refills | Status: DC | PRN

## 2016-09-02 NOTE — Progress Notes (Signed)
Histology and Location of Primary Cancer: IgG Kappa myeloma   Location(s) of Symptomatic Metastases: T11 and T12   Past/Anticipated chemotherapy by medical oncology, if any: Velcade/Revlimid/Decadron - start 09/04/2016 and Xgeva 120mg Sq q month  Pain on a scale of 0-10 is: 8 - using a fentanyl patch and takes oxycodone 5 mg or percocet as needed.  If Spine Met(s), symptoms, if any, include:  Bowel/Bladder retention or incontinence (please describe): has constipation.  Last bm was 6 days ago.  She is taking miralax.    Numbness or weakness in extremities (please describe): has weakness in her legs  Current Decadron regimen, if applicable: has not started yet.  Ambulatory status? Walker? Wheelchair?: Wheelchair  SAFETY ISSUES:  Prior radiation? no  Pacemaker/ICD? no  Possible current pregnancy? no  Is the patient on methotrexate? no  Current Complaints / other details:  Patient had a kyphoplasty on February 1 of T11 and T12.  Patient is here with her husband and friend.  BP (!) 153/65 (BP Location: Right Arm, Patient Position: Sitting)   Pulse (!) 105   Temp 97.9 F (36.6 C) (Oral)   Ht 5' 1" (1.549 m)   SpO2 98%   

## 2016-09-02 NOTE — Progress Notes (Signed)
  Radiation Oncology         ([phone] ________________________________  Name: Debra Ruiz MRN: [id number]  Date: 09/02/2016  DOB: 04/18/1946  SIMULATION AND TREATMENT PLANNING NOTE    ICD-9-CM ICD-10-CM   1. Multiple myeloma not having achieved remission ([place]) 203.00 C90.00     DIAGNOSIS: IgG Kappa myeloma - compression fractures - 13q-, +11, 14q+  NARRATIVE:  The patient was brought to the [facility] suite.  Identity was confirmed.  All relevant records and images related to the planned course of therapy were reviewed.  The patient freely provided informed written consent to proceed with treatment after reviewing the details related to the planned course of therapy. The consent form was witnessed and verified by the simulation staff.  Then, the patient was set-up in a stable reproducible  supine position for radiation therapy.  CT images were obtained.  Surface markings were placed.  The CT images were loaded into the planning software.  Then the target and avoidance structures were contoured.  Treatment planning then occurred.  The radiation prescription was entered and confirmed.  Then, I designed and supervised the construction of a total of 3 medically necessary complex treatment devices.  I have requested : 3D Simulation  I have requested a DVH of the following structures:GTV, kidneys, spinal cord, bowel.  I have ordered:dose calc.  PLAN:  The patient will receive 30 Gy in 15 fractions.  -----------------------------------  Blair Promise, PhD, MD

## 2016-09-02 NOTE — Progress Notes (Signed)
Radiation Oncology         [phone] ________________________________  Initial Outpatient Consultation  Name: Debra Ruiz MRN: [id number]  Date: 09/02/2016  DOB: 1946/01/07  NO:TRRNHA,FBXU J, MD  [name], MD   REFERRING PHYSICIAN: [name], MD  DIAGNOSIS: IgG Kappa myeloma - compression fractures - 13q-, +11, 14q+  HISTORY OF PRESENT ILLNESS::Debra Ruiz is a 71 y.o. female who is seen at the request of Dr. Marin Olp. The patient initially presented with some back pain in Fall 2017. She fell in September 2017, and subsequent MRI in October revealed a compression fracture at T12.  The patient's pain continued to worsen, and she saw Dr. Lynann Bologna, who ordered another MRI. This revealed an acute compression fracture at T11 and an old fracture at T12. There were marrow space lesions which were felt to be consistent with myeloma or metastatic disease.  The patient underwent kyphoplasty on 08/22/16 of T11 and T12.  On review of systems, the patient reports pain to her back 8/10 in severity for which she is currently using a fentanyl patch and oxycodone or percocet as needed. She reports weakness in bilateral legs. The patient is in a wheelchair and reports she cannot walk. She rpeorts her vision is "not as good as it used to be," and "things look strange sometimes." The patient denies headaches. She reports constipation. Her last bowel movement was 6 days ago., She is currently taking Miralax. Her pain in the lower thoracic spine has improved with her kyphoplasty but is still significant. Her major complaints of pain in the lumbosacral area at this time  She reports to the clinic today with her husband and friend.  PREVIOUS RADIATION THERAPY: No  PAST MEDICAL HISTORY:  has a past medical history of Anemia; Anxiety; Arthritis; Depression; Dry eyes; Goals of care, counseling/discussion (08/28/2016); Hyperlipidemia; Hypertension; Insomnia; Multiple myeloma ([place]) (08/22/2016);  Palpitations; and Vaginal dryness.    PAST SURGICAL HISTORY: Past Surgical History:  Procedure Laterality Date  . FACIAL COSMETIC SURGERY    . KYPHOPLASTY N/A 08/22/2016   Procedure: THORACIC 11, THORACIC 12 KYPHOPLASTY;  Surgeon: Phylliss Bob, MD;  Location: [facility];  Service: Orthopedics;  Laterality: N/A;  THORACIC 11, THORACIC 12 KYPHOPLASTY  . NO PAST SURGERIES      FAMILY HISTORY: family history includes Heart disease in her brother and father; Hypertension in her mother; Multiple myeloma in her cousin and maternal aunt; Stroke in her mother.  SOCIAL HISTORY:  reports that she quit smoking about 31 years ago. Her smoking use included Cigarettes. She has never used smokeless tobacco. She reports that she drinks about 12.5 oz of alcohol per week . She reports that she does not use drugs.  ALLERGIES: Crestor [rosuvastatin calcium]  MEDICATIONS:  Current Outpatient Prescriptions  Medication Sig Dispense Refill  . ALPRAZolam (XANAX) 0.25 MG tablet Take 1 tablet (0.25 mg total) by mouth 2 (two) times daily as needed for anxiety. 60 tablet 0  . amLODipine (NORVASC) 5 MG tablet TAKE 1 TABLET EVERY DAY 90 tablet 3  . atorvastatin (LIPITOR) 20 MG tablet TAKE 1 TABLET EVERY DAY (Patient taking differently: TAKE 1 TABLET EVERY EVENING) 90 tablet 1  . baclofen (LIORESAL) 10 MG tablet Take 1 tablet (10 mg total) by mouth every 8 (eight) hours as needed for muscle spasms (Pain). 60 tablet 0  . diazepam (VALIUM) 5 MG tablet 5 mg.  1  . escitalopram (LEXAPRO) 20 MG tablet TAKE 1 TABLET EVERY DAY 90 tablet 2  .  fentaNYL (DURAGESIC) 12 MCG/HR Place 1 patch (12.5 mcg total) onto the skin every 3 (three) days. 10 patch 0  . hydrochlorothiazide (HYDRODIURIL) 25 MG tablet TAKE 1 TABLET EVERY DAY 90 tablet 2  . methocarbamol (ROBAXIN) 500 MG tablet     . Multiple Vitamins-Minerals (CENTRUM SILVER PO) Take 1 tablet by mouth daily.    . nebivolol (BYSTOLIC) 5 MG tablet Take 5 mg by mouth daily.    Marland Kitchen  oxyCODONE-acetaminophen (ROXICET) 5-325 MG tablet Take 1 tablet by mouth every 6 (six) hours as needed for severe pain. 60 tablet 0  . potassium chloride SA (K-DUR,KLOR-CON) 20 MEQ tablet TAKE 1 TABLET EVERY DAY 90 tablet 1  . predniSONE (DELTASONE) 10 MG tablet Take in tapering course as directed 6-5-4-3-2-1 q am with food 21 tablet 0  . dexamethasone (DECADRON) 4 MG tablet Take 5 pills with food once a week (Patient not taking: Reported on 09/02/2016) 60 tablet 3  . famciclovir (FAMVIR) 250 MG tablet Take 1 tablet (250 mg total) by mouth daily. (Patient not taking: Reported on 09/02/2016) 30 tablet 12  . lenalidomide (REVLIMID) 25 MG capsule Take 1 capsule daily for 21 days and then off for 7 days (Patient not taking: Reported on 09/02/2016) 21 capsule 4  . oxycodone (OXY-IR) 5 MG capsule Take 5 mg by mouth every 4 (four) hours as needed for pain.     . valACYclovir (VALTREX) 1000 MG tablet Take 1,000 mg by mouth 2 (two) times daily as needed (FEVER BLISTERS).      No current facility-administered medications for this encounter.     REVIEW OF SYSTEMS:  A 15 point review of systems is documented in the electronic medical record. This was obtained by the nursing staff. However, I reviewed this with the patient to discuss relevant findings and make appropriate changes.  Pertinent items are noted in HPI.   PHYSICAL EXAM:  height is 5' 1" (1.549 m). Her oral temperature is 97.9 F (36.6 C). Her blood pressure is 153/65 (abnormal) and her pulse is 105 (abnormal). Her oxygen saturation is 98%.   Oral cavity is clear. EOMI. PERRLA. Lungs are clear to auscultation bilaterally. Hear is regular in rate and rhythm. Strength is symmetric in extremities But in general weak all over In general she appears to be in significant pain, and any movement in the wheelchair looks very uncomfortable for her.   ECOG = 3  LABORATORY DATA:  Lab Results  Component Value Date   WBC 6.1 08/19/2016   HGB 10.6 (L)  08/19/2016   HCT 32.2 (L) 08/19/2016   MCV 94 08/19/2016   PLT 189 08/19/2016   NEUTROABS 3.7 08/19/2016   Lab Results  Component Value Date   NA 140 08/19/2016   K 3.6 08/19/2016   CL 96 (L) 08/19/2016   CO2 30 08/19/2016   GLUCOSE 113 08/19/2016   CREATININE 0.6 08/19/2016   CALCIUM 10.9 (H) 08/19/2016      RADIOGRAPHY: Dg Chest 2 View  Result Date: 08/20/2016 CLINICAL DATA:  Pre-admission for kyphoplasty. History of hypertension. EXAM: CHEST  2 VIEW COMPARISON:  08/19/2016 FINDINGS: Cardiac silhouette is mildly enlarged. No mediastinal or hilar masses. No evidence of adenopathy. Lungs are mildly hyperexpanded but clear. No pleural effusion. No pneumothorax. Skeletal structures are demineralized. There are lucent lesions, most evident in the distal right clavicle. There are multiple vertebral fractures, stable when compared to the previous day's bone survey. IMPRESSION: 1. No acute cardiopulmonary disease. Electronically Signed   By: Shanon Brow  Ormond M.D.   On: 08/20/2016 14:50   Dg Thoracolumabar Spine  Result Date: 08/22/2016 CLINICAL DATA:  Kyphoplasty at T11-T12 EXAM: THORACOLUMBAR SPINE 1V COMPARISON:  MR spine of 08/10/2016 FINDINGS: Two C-arm spot films show kyphoplasty performed at T11 and T12 levels. No complicating features are seen. IMPRESSION: Kyphoplasty at T11 and T12. Electronically Signed   By: Ivar Drape M.D.   On: 08/22/2016 13:10   Mr Lumbar Spine Wo Contrast  Result Date: 08/11/2016 CLINICAL DATA:  Low back pain and spasms over the last 2 months. [place] in September of 2017. EXAM: MRI LUMBAR SPINE WITHOUT CONTRAST TECHNIQUE: Multiplanar, multisequence MR imaging of the lumbar spine was performed. No intravenous contrast was administered. COMPARISON:  05/19/2016.  12/08/2009. FINDINGS: Segmentation:  5 lumbar type vertebral bodies. Alignment: 2 mm retrolisthesis L2-3. 4 mm anterolisthesis L3-4. Mild curvature convex to the left with the apex at L2-3. Vertebrae: Acute or  subacute superior endplate compression fracture at T11. Loss of height 10% or less. Old healed compression fracture at T12 with anterior loss of height of 60%. No retropulsed bone. No other regional fracture. Chronic discogenic endplate marrow changes at L1-2, L2-3 and L4-5. Importantly, there appear to be focal marrow space lesions which are concerning for myeloma or metastatic disease. Conus medullaris: Extends to the L1-2 level and appears normal. Paraspinal and other soft tissues: Negative Disc levels: Superior endplate fracture at G25 with loss of height of 10%. T10-11 disc is unremarkable. No stenosis. Old T12 compression fracture with loss of height anteriorly of 60%. No traumatic disc herniation. No stenosis. T12-L1 disc bulges minimally. L1-2: Chronic disc degeneration with endplate osteophytes and bulging of the disc. Facet hypertrophy. Mild stenosis of the right lateral recess without visible neural compression. L2-3: Retrolisthesis of 2 mm. Endplate osteophytes and bulging of the disc. Stenosis of both lateral recesses and neural foramina without definite neural compression. No change since the previous study. L3-4: Advanced bilateral facet arthropathy with 4 mm of anterolisthesis. Endplate osteophytes and bulging of the disc. Stenosis of both lateral recesses and neural foramina could cause neural compression. No change since the previous study. L4-5: Endplate osteophytes and bulging of the disc. Bilateral facet hypertrophy. Moderate multifactorial stenosis could cause neural compression. No apparent change. L5-S1: Bulging of the disc. Mild facet hypertrophy. Mild narrowing of the subarticular lateral recesses without visible neural compression. IMPRESSION: Acute superior endplate fracture at K27 with loss of height of 10%. No retropulsed bone. Healing of the previously seen fracture at T12 without progression or canal compromise. Multifocal marrow space lesions affecting the lumbar spine, sacrum and  iliac bones worrisome for myeloma or metastatic disease. Moderate multifactorial stenosis at L2-3. Severe multifactorial stenosis at L3-4. Moderate multifactorial stenosis at L4-5. These results will be called to the ordering clinician or representative by the Radiologist Assistant, and communication documented in the PACS or zVision Dashboard. Electronically Signed   By: Nelson Chimes M.D.   On: 08/11/2016 08:03   Dg Bone Survey Met  Result Date: 08/19/2016 CLINICAL DATA:  Low back pain and tenderness since falling last month. The patient reports generalized body aches and pains but no new injury or trauma to any other area. History of abnormal bone marrow examination consistent with probable multiple myeloma. EXAM: METASTATIC BONE SURVEY COMPARISON:  None in PACs FINDINGS: Chest x-ray: The lungs are well-expanded. The interstitial markings are coarse. There is no alveolar infiltrate or pleural effusion. The heart is top-normal in size. The pulmonary vascularity is not engorged. There is calcification  in the wall of the aortic arch. There is lucency involving the posterior aspect of the right seventh rib which is suspicious for a lytic process. There is no pleural effusion. Calvarium: There are multiple lytic lesions within the calvarium measuring up to 1.5 cm in diameter. Spine: There is wedge compression of T8, T11, and T12 with loss of height anteriorly of approximately 40-50%. There is degenerative disc disease at multiple cervical levels as well as within the thoracic and lumbar spines. Upper extremities: There are multiple lucencies within the shafts of the humeri as well as within the proximal right radius. Pelvis and lower extremities. The bony pelvis exhibits abnormal lucency in the inferior aspect of the left ischium and lateral aspect of the inferior pubic ramus on the left. The hip joint spaces are reasonably well-maintained. The sacrum is grossly intact. There are lucencies present within both both  femoral shafts and the left fibular shaft. IMPRESSION: Widespread lytic lesions within the skeleton worrisome for active myeloma. Coarse interstitial lung markings without alveolar pneumonia. No CHF. Thoracic aortic atherosclerosis. Electronically Signed   By: David  Martinique M.D.   On: 08/19/2016 15:48   Dg C-arm 1-60 Min  Result Date: 08/22/2016 CLINICAL DATA:  Kyphoplasty of T11-T12 EXAM: DG C-ARM 61-120 MIN COMPARISON:  C-arm spot films FINDINGS: C-arm fluoroscopy was provided during kyphoplasty at T11 and T12. Fluoroscopy time of 4 minutes 9 seconds was recorded. IMPRESSION: C-arm fluoroscopy provided during kyphoplasty at T11 and T12. Electronically Signed   By: Ivar Drape M.D.   On: 08/22/2016 13:09      IMPRESSION: IgG Kappa myeloma - compression fractures - 13q-, +11, 14q+. Patient is in severe pain related to her lesions along the thoracic and lumbosacral spine. She would be a good candidate for palliative radiation therapy directed to the area.   PLAN: We discussed the risks, benefits, and side effects of radiation treatment. We discussed the logistics of delivery for the patient's education. I encouraged the patient to ask questions which I answered to the best of my ability. The patient is ready to move forward with CT Simulation and treatment planning; she is scheduled for this today. A consent form was discussed, signed, and placed in the patient's chart. Anticipate 10 treatments.   We reviewed the patient's prescriptions and discussed how to take her pain medication for best pain management results.   ------------------------------------------------  Blair Promise, PhD, MD  This document serves as a record of services personally performed by Gery Pray, MD. It was created on his behalf by Maryla Morrow, a trained medical scribe. The creation of this record is based on the scribe's personal observations and the provider's statements to them. This document has been checked and  approved by the attending provider.

## 2016-09-03 ENCOUNTER — Telehealth: Payer: Self-pay | Admitting: *Deleted

## 2016-09-03 NOTE — Telephone Encounter (Signed)
Per staff message I have scheduled lab appt. I called and lmom regarding appts

## 2016-09-04 ENCOUNTER — Ambulatory Visit
Admission: RE | Admit: 2016-09-04 | Discharge: 2016-09-04 | Disposition: A | Discharge status: Home / Self Care | Payer: PPO | Source: Ambulatory Visit | Attending: Radiation Oncology | Admitting: Radiation Oncology

## 2016-09-04 ENCOUNTER — Telehealth: Payer: Self-pay

## 2016-09-04 ENCOUNTER — Other Ambulatory Visit (HOSPITAL_BASED_OUTPATIENT_CLINIC_OR_DEPARTMENT_OTHER): Payer: PPO

## 2016-09-04 ENCOUNTER — Ambulatory Visit (HOSPITAL_BASED_OUTPATIENT_CLINIC_OR_DEPARTMENT_OTHER): Payer: PPO

## 2016-09-04 VITALS — BP 120/88 | HR 88 | Temp 97.7°F | Resp 20

## 2016-09-04 VITALS — BP 118/63 | HR 91

## 2016-09-04 DIAGNOSIS — Z5112 Encounter for antineoplastic immunotherapy: Secondary | ICD-10-CM | POA: Diagnosis not present

## 2016-09-04 DIAGNOSIS — C9000 Multiple myeloma not having achieved remission: Secondary | ICD-10-CM

## 2016-09-04 DIAGNOSIS — S22000A Wedge compression fracture of unspecified thoracic vertebra, initial encounter for closed fracture: Secondary | ICD-10-CM

## 2016-09-04 DIAGNOSIS — C7951 Secondary malignant neoplasm of bone: Secondary | ICD-10-CM | POA: Diagnosis not present

## 2016-09-04 LAB — COMPREHENSIVE METABOLIC PANEL
ALT: 75 U/L — ABNORMAL HIGH (ref 0–55)
AST: 64 U/L — AB (ref 5–34)
Albumin: 3.3 g/dL — ABNORMAL LOW (ref 3.5–5.0)
Alkaline Phosphatase: 86 U/L (ref 40–150)
Anion Gap: 9 mEq/L (ref 3–11)
BUN: 20 mg/dL (ref 7.0–26.0)
CALCIUM: 10.8 mg/dL — AB (ref 8.4–10.4)
CHLORIDE: 96 meq/L — AB (ref 98–109)
CO2: 25 meq/L (ref 22–29)
Creatinine: 1 mg/dL (ref 0.6–1.1)
EGFR: 55 mL/min/{1.73_m2} — ABNORMAL LOW (ref >90)
GLUCOSE: 96 mg/dL (ref 70–140)
POTASSIUM: 3.1 meq/L — AB (ref 3.5–5.1)
SODIUM: 131 meq/L — AB (ref 136–145)
Total Bilirubin: 0.45 mg/dL (ref 0.20–1.20)
Total Protein: 11 g/dL — ABNORMAL HIGH (ref 6.4–8.3)

## 2016-09-04 LAB — CBC WITH DIFFERENTIAL/PLATELET
BASO%: 0.1 % (ref 0.0–2.0)
BASOS ABS: 0 10*3/uL (ref 0.0–0.1)
EOS%: 1.3 % (ref 0.0–7.0)
Eosinophils Absolute: 0.1 10*3/uL (ref 0.0–0.5)
HCT: 27.8 % — ABNORMAL LOW (ref 34.8–46.6)
HGB: 9 g/dL — ABNORMAL LOW (ref 11.6–15.9)
LYMPH%: 30.8 % (ref 14.0–49.7)
MCH: 28.8 pg (ref 25.1–34.0)
MCHC: 32.4 g/dL (ref 31.5–36.0)
MCV: 89.1 fL (ref 79.5–101.0)
MONO#: 0.4 10*3/uL (ref 0.1–0.9)
MONO%: 6.1 % (ref 0.0–14.0)
NEUT#: 4.2 10*3/uL (ref 1.5–6.5)
NEUT%: 61.7 % (ref 38.4–76.8)
Platelets: 213 10*3/uL (ref 145–400)
RBC: 3.12 10*6/uL — AB (ref 3.70–5.45)
RDW: 17 % — ABNORMAL HIGH (ref 11.2–14.5)
WBC: 6.8 10*3/uL (ref 3.9–10.3)
lymph#: 2.1 10*3/uL (ref 0.9–3.3)

## 2016-09-04 LAB — TECHNOLOGIST REVIEW

## 2016-09-04 MED ORDER — DENOSUMAB 120 MG/1.7ML ~~LOC~~ SOLN
120.0000 mg | Freq: Once | SUBCUTANEOUS | Status: AC
  Administered 2016-09-04: 120 mg via SUBCUTANEOUS
  Filled 2016-09-04: qty 1.7

## 2016-09-04 MED ORDER — BORTEZOMIB CHEMO SQ INJECTION 3.5 MG (2.5MG/ML)
1.3000 mg/m2 | Freq: Once | INTRAMUSCULAR | Status: AC
  Administered 2016-09-04: 2 mg via SUBCUTANEOUS
  Filled 2016-09-04: qty 2

## 2016-09-04 MED ORDER — HYDROMORPHONE HCL 1 MG/ML IJ SOLN
0.5000 mg | Freq: Once | INTRAMUSCULAR | Status: AC
  Administered 2016-09-04: 0.5 mg via INTRAMUSCULAR
  Filled 2016-09-04: qty 1

## 2016-09-04 MED ORDER — PROCHLORPERAZINE MALEATE 10 MG PO TABS
ORAL_TABLET | ORAL | Status: AC
  Filled 2016-09-04: qty 1

## 2016-09-04 MED ORDER — PROCHLORPERAZINE MALEATE 10 MG PO TABS
10.0000 mg | ORAL_TABLET | Freq: Once | ORAL | Status: AC
  Administered 2016-09-04: 10 mg via ORAL

## 2016-09-04 NOTE — Telephone Encounter (Signed)
Nutrition Assessment   Reason for Assessment:   Patient identified on Malnutrition Screening Report for poor appetite and weight loss  ASSESSMENT:  Spoke with patient via phone this am. 71 year old female with myeloma.  Planning to receive palliative radiation and chemotherapy.   Past medical history of anemia, arthritis, HLD, HTN, had kyphoplasty on 08/22/16.  Patient reports that she is in a lot of pain and that takes her appetite away.  Reports little relief from pain medications. Patient is hopeful radiation and chemotherapy will relieve some of her pain, first treatments planned today. Patient is in wheelchair most of the time.  Patient reports appetite has been decreased for a few weeks. " I eat about 1/4 of what I normally would eat."  Patient has not tried ensure/boost drinks.  She reports no nausea/vomiting. Reports constipation with little relief from miralax.    Nutrition Focused Physical Exam: unable to complete, phone call  Medications: MVI, prednisone, KCL  Labs: reviewed  Anthropometrics:   Height: 61 inches Weight: 118 lb UBW:  BMI: 22  5% weight loss in the last 2 months   Estimated Energy Needs  Kcals: 1350-1600 calories/d Protein: 65-81 g/d Fluid: 1.6 L/d  NUTRITION DIAGNOSIS: Inadequate oral intake related to pain, altered GI function (constipation) as evidenced by eating 1/4 of normal intake   MALNUTRITION DIAGNOSIS: unable to determine at this time   INTERVENTION:   Discussed adding oral nutrition supplements with at least 350 calories or more. Recommend at least 2 times per day.  Coupons mailed. Discussed ways to increase calories and protein but patient reports "I am in so much pain I think I understand what you are saying."  Will need follow-up on diet education. Mailed "Increasing Calories and Protein" handout as well as "Constipation" handout. Encouraged patient to discuss constipation with MD today at appointment.  Contact information  mailed    MONITORING, EVALUATION, GOAL: Patient will consume adequate calories and protein to maintain weight   NEXT VISIT: as needed  Usbaldo Pannone B. Zenia Resides, [place], [place] (pager)

## 2016-09-04 NOTE — Patient Instructions (Addendum)
[facility] Discharge Instructions for Patients Receiving Chemotherapy  Today you received the following chemotherapy agents Velcade  To help prevent nausea and vomiting after your treatment, we encourage you to take your nausea medication as directed.   If you develop nausea and vomiting that is not controlled by your nausea medication, call the clinic.   BELOW ARE SYMPTOMS THAT SHOULD BE REPORTED IMMEDIATELY:  *FEVER GREATER THAN 100.5 F  *CHILLS WITH OR WITHOUT FEVER  NAUSEA AND VOMITING THAT IS NOT CONTROLLED WITH YOUR NAUSEA MEDICATION  *UNUSUAL SHORTNESS OF BREATH  *UNUSUAL BRUISING OR BLEEDING  TENDERNESS IN MOUTH AND THROAT WITH OR WITHOUT PRESENCE OF ULCERS  *URINARY PROBLEMS  *BOWEL PROBLEMS  UNUSUAL RASH Items with * indicate a potential emergency and should be followed up as soon as possible.  Feel free to call the clinic you have any questions or concerns. The clinic phone number is [phone].  Please show the [place] at check-in to the Emergency Department and triage nurse.  Bortezomib injection What is this medicine? BORTEZOMIB (bor TEZ oh mib) is a medicine that targets proteins in cancer cells and stops the cancer cells from growing. It is used to treat multiple myeloma and mantle-cell lymphoma. This medicine may be used for other purposes; ask your health care provider or pharmacist if you have questions. COMMON BRAND NAME(S): Velcade What should I tell my health care provider before I take this medicine? They need to know if you have any of these conditions: -diabetes -heart disease -irregular heartbeat -liver disease -on hemodialysis -low blood counts, like low white blood cells, platelets, or hemoglobin -peripheral neuropathy -taking medicine for blood pressure -an unusual or allergic reaction to bortezomib, mannitol, boron, other medicines, foods, dyes, or preservatives -pregnant or trying to get  pregnant -breast-feeding How should I use this medicine? This medicine is for injection into a vein or for injection under the skin. It is given by a health care professional in a hospital or clinic setting. Talk to your pediatrician regarding the use of this medicine in children. Special care may be needed. Overdosage: If you think you have taken too much of this medicine contact a poison control center or emergency room at once. NOTE: This medicine is only for you. Do not share this medicine with others. What if I miss a dose? It is important not to miss your dose. Call your doctor or health care professional if you are unable to keep an appointment. What may interact with this medicine? This medicine may interact with the following medications: -ketoconazole -rifampin -ritonavir -St. John's Wort This list may not describe all possible interactions. Give your health care provider a list of all the medicines, herbs, non-prescription drugs, or dietary supplements you use. Also tell them if you smoke, drink alcohol, or use illegal drugs. Some items may interact with your medicine. What should I watch for while using this medicine? You may get drowsy or dizzy. Do not drive, use machinery, or do anything that needs mental alertness until you know how this medicine affects you. Do not stand or sit up quickly, especially if you are an older patient. This reduces the risk of dizzy or fainting spells. In some cases, you may be given additional medicines to help with side effects. Follow all directions for their use. Call your doctor or health care professional for advice if you get a fever, chills or sore throat, or other symptoms of a cold or flu. Do not treat yourself.  This drug decreases your body's ability to fight infections. Try to avoid being around people who are sick. This medicine may increase your risk to bruise or bleed. Call your doctor or health care professional if you notice any unusual  bleeding. You may need blood work done while you are taking this medicine. In some patients, this medicine may cause a serious brain infection that may cause death. If you have any problems seeing, thinking, speaking, walking, or standing, tell your doctor right away. If you cannot reach your doctor, urgently seek other source of medical care. Check with your doctor or health care professional if you get an attack of severe diarrhea, nausea and vomiting, or if you sweat a lot. The loss of too much body fluid can make it dangerous for you to take this medicine. Do not become pregnant while taking this medicine or for at least 2 months after stopping it. Women should inform their doctor if they wish to become pregnant or think they might be pregnant. Men should not father a child while taking this medicine and for at least 2 months after stopping it. There is a potential for serious side effects to an unborn child. Talk to your health care professional or pharmacist for more information. Do not breast-feed an infant while taking this medicine or for 2 months after stopping it. This medicine may interfere with the ability to have a child. You should talk with your doctor or health care professional if you are concerned about your fertility. What side effects may I notice from receiving this medicine? Side effects that you should report to your doctor or health care professional as soon as possible: -allergic reactions like skin rash, itching or hives, swelling of the face, lips, or tongue -breathing problems -changes in hearing -changes in vision -fast, irregular heartbeat -feeling faint or lightheaded, falls -pain, tingling, numbness in the hands or feet -right upper belly pain -seizures -swelling of the ankles, feet, hands -unusual bleeding or bruising -unusually weak or tired -vomiting -yellowing of the eyes or skin Side effects that usually do not require medical attention (report to your  doctor or health care professional if they continue or are bothersome): -changes in emotions or moods -constipation -diarrhea -loss of appetite -headache -irritation at site where injected -nausea This list may not describe all possible side effects. Call your doctor for medical advice about side effects. You may report side effects to FDA at 1-800-FDA-1088. Where should I keep my medicine? This drug is given in a hospital or clinic and will not be stored at home. NOTE: This sheet is a summary. It may not cover all possible information. If you have questions about this medicine, talk to your doctor, pharmacist, or health care provider.  2017 Elsevier/Gold Standard (2016-01-03 18:30:39)  Denosumab injection What is this medicine? DENOSUMAB (den oh sue mab) slows bone breakdown. Prolia is used to treat osteoporosis in women after menopause and in men. Delton See is used to prevent bone fractures and other bone problems caused by cancer bone metastases. Delton See is also used to treat giant cell tumor of the bone. COMMON BRAND NAME(S): Prolia, XGEVA What should I tell my health care provider before I take this medicine? They need to know if you have any of these conditions: -dental disease -eczema -infection or history of infections -kidney disease or on dialysis -low blood calcium or vitamin D -malabsorption syndrome -scheduled to have surgery or tooth extraction -taking medicine that contains denosumab -thyroid or parathyroid disease -an  unusual reaction to denosumab, other medicines, foods, dyes, or preservatives -pregnant or trying to get pregnant -breast-feeding How should I use this medicine? This medicine is for injection under the skin. It is given by a health care professional in a hospital or clinic setting. If you are getting Prolia, a special MedGuide will be given to you by the pharmacist with each prescription and refill. Be sure to read this information carefully each time. For  Prolia, talk to your pediatrician regarding the use of this medicine in children. Special care may be needed. For Delton See, talk to your pediatrician regarding the use of this medicine in children. While this drug may be prescribed for children as young as 13 years for selected conditions, precautions do apply. What if I miss a dose? It is important not to miss your dose. Call your doctor or health care professional if you are unable to keep an appointment. What may interact with this medicine? Do not take this medicine with any of the following medications: -other medicines containing denosumab This medicine may also interact with the following medications: -medicines that suppress the immune system -medicines that treat cancer -steroid medicines like prednisone or cortisone What should I watch for while using this medicine? Visit your doctor or health care professional for regular checks on your progress. Your doctor or health care professional may order blood tests and other tests to see how you are doing. Call your doctor or health care professional if you get a cold or other infection while receiving this medicine. Do not treat yourself. This medicine may decrease your body's ability to fight infection. You should make sure you get enough calcium and vitamin D while you are taking this medicine, unless your doctor tells you not to. Discuss the foods you eat and the vitamins you take with your health care professional. See your dentist regularly. Brush and floss your teeth as directed. Before you have any dental work done, tell your dentist you are receiving this medicine. Do not become pregnant while taking this medicine or for 5 months after stopping it. Women should inform their doctor if they wish to become pregnant or think they might be pregnant. There is a potential for serious side effects to an unborn child. Talk to your health care professional or pharmacist for more information. What side  effects may I notice from receiving this medicine? Side effects that you should report to your doctor or health care professional as soon as possible: -allergic reactions like skin rash, itching or hives, swelling of the face, lips, or tongue -breathing problems -chest pain -fast, irregular heartbeat -feeling faint or lightheaded, falls -fever, chills, or any other sign of infection -muscle spasms, tightening, or twitches -numbness or tingling -skin blisters or bumps, or is dry, peels, or red -slow healing or unexplained pain in the mouth or jaw -unusual bleeding or bruising Side effects that usually do not require medical attention (report to your doctor or health care professional if they continue or are bothersome): -muscle pain -stomach upset, gas Where should I keep my medicine? This medicine is only given in a clinic, doctor's office, or other health care setting and will not be stored at home.  2017 Elsevier/Gold Standard (2015-08-10 10:06:55)

## 2016-09-04 NOTE — Progress Notes (Signed)
Patient reporting pain at a 9/10 in her lower back.  She was given 0.5 mg dilaudid IM per Dr. Sondra Come in her right deltoid.

## 2016-09-04 NOTE — Progress Notes (Signed)
  Radiation Oncology         [phone] ________________________________  Name: Debra Ruiz MRN: [id number]  Date: 09/04/2016  DOB: Mar 26, 1946  Simulation Verification Note    ICD-9-CM ICD-10-CM   1. Thoracic compression fracture, closed, initial encounter ([place]) 805.2 S22.000A HYDROmorphone (DILAUDID) injection 0.5 mg  2. Multiple myeloma not having achieved remission ([place]) 203.00 C90.00     Status: outpatient  NARRATIVE: The patient was brought to the treatment unit and placed in the planned treatment position. The clinical setup was verified. Then port films were obtained and uploaded to the radiation oncology medical record software.  The treatment beams were carefully compared against the planned radiation fields. The position location and shape of the radiation fields was reviewed. They targeted volume of tissue appears to be appropriately covered by the radiation beams. Organs at risk appear to be excluded as planned.  Based on my personal review, I approved the simulation verification. The patient's treatment will proceed as planned.  -----------------------------------  Blair Promise, PhD, MD

## 2016-09-04 NOTE — Progress Notes (Signed)
Patient reports pain is much better and is rating it at a 5/10.

## 2016-09-04 NOTE — Progress Notes (Signed)
I s/w pt and family re: Valtrex + Famvir.  Pt states she takes Valtrex for fever blisters or if her face breaks out "if she has something done." I advised pt to go p/u her Famvir and start now as she starts Velcade to prevent shingles.  She will likely get the same benefit from [facility] anyway.  Pts husband present and plans to get Famvir tonight.  Kennith Center, Pharm.D., CPP 09/04/2016@2 :18 PM

## 2016-09-05 ENCOUNTER — Ambulatory Visit
Admission: RE | Admit: 2016-09-05 | Discharge: 2016-09-05 | Disposition: A | Discharge status: Home / Self Care | Payer: PPO | Source: Ambulatory Visit | Attending: Radiation Oncology | Admitting: Radiation Oncology

## 2016-09-05 DIAGNOSIS — C7951 Secondary malignant neoplasm of bone: Secondary | ICD-10-CM | POA: Diagnosis not present

## 2016-09-05 DIAGNOSIS — C9000 Multiple myeloma not having achieved remission: Secondary | ICD-10-CM | POA: Diagnosis not present

## 2016-09-06 ENCOUNTER — Ambulatory Visit
Admission: RE | Admit: 2016-09-06 | Discharge: 2016-09-06 | Disposition: A | Discharge status: Home / Self Care | Payer: PPO | Source: Ambulatory Visit | Attending: Radiation Oncology | Admitting: Radiation Oncology

## 2016-09-06 DIAGNOSIS — C9000 Multiple myeloma not having achieved remission: Secondary | ICD-10-CM | POA: Diagnosis not present

## 2016-09-06 DIAGNOSIS — C7951 Secondary malignant neoplasm of bone: Secondary | ICD-10-CM | POA: Diagnosis not present

## 2016-09-06 NOTE — Addendum Note (Signed)
Encounter addended by: Jacqulyn Liner, RN on: 09/06/2016 10:26 AM<BR>    Actions taken: Charge Capture section accepted

## 2016-09-09 ENCOUNTER — Ambulatory Visit
Admission: RE | Admit: 2016-09-09 | Discharge: 2016-09-09 | Disposition: A | Discharge status: Home / Self Care | Payer: PPO | Source: Ambulatory Visit | Attending: Radiation Oncology | Admitting: Radiation Oncology

## 2016-09-09 ENCOUNTER — Encounter (HOSPITAL_COMMUNITY): Payer: Self-pay

## 2016-09-09 DIAGNOSIS — C9000 Multiple myeloma not having achieved remission: Secondary | ICD-10-CM | POA: Diagnosis not present

## 2016-09-09 DIAGNOSIS — C7951 Secondary malignant neoplasm of bone: Secondary | ICD-10-CM | POA: Diagnosis not present

## 2016-09-10 ENCOUNTER — Ambulatory Visit
Admission: RE | Admit: 2016-09-10 | Discharge: 2016-09-10 | Disposition: A | Discharge status: Home / Self Care | Payer: PPO | Source: Ambulatory Visit | Attending: Radiation Oncology | Admitting: Radiation Oncology

## 2016-09-10 ENCOUNTER — Encounter: Payer: Self-pay | Admitting: Hematology & Oncology

## 2016-09-10 VITALS — BP 129/52 | HR 77 | Temp 97.7°F | Resp 18

## 2016-09-10 DIAGNOSIS — C7951 Secondary malignant neoplasm of bone: Secondary | ICD-10-CM | POA: Diagnosis not present

## 2016-09-10 DIAGNOSIS — C9000 Multiple myeloma not having achieved remission: Secondary | ICD-10-CM | POA: Insufficient documentation

## 2016-09-10 MED ORDER — BIAFINE EX EMUL
Freq: Once | CUTANEOUS | Status: AC
  Administered 2016-09-10: 16:00:00 via TOPICAL

## 2016-09-10 MED ORDER — PROCHLORPERAZINE MALEATE 10 MG PO TABS: 10.0000 mg | ORAL_TABLET | Freq: Four times a day (QID) | ORAL | 0 refills | Status: DC | PRN

## 2016-09-10 NOTE — Progress Notes (Signed)
  Radiation Oncology         [phone] ________________________________  Name: Debra Ruiz MRN: [id number]  Date: 09/10/2016  DOB: Sep 20, 1945  Weekly Radiation Therapy Management    ICD-9-CM ICD-10-CM   1. Multiple myeloma not having achieved remission ([place]) 203.00 C90.00      Current Dose: 10 Gy     Planned Dose:  30 Gy  Narrative . . . . . . . . The patient presents for routine under treatment assessment.                                    The patient completed 5 fractions. She reports her lower back aches as a 5/10 and reports it is worse with spasms. She has pain when she sits and has some relief lying in bed. She reports her energy level is slightly improving and is ambulating with a walker. She has a decrease in appetite. The patient has some nausea and drinks ginger ale for it. The nurse notes slight hyperpigmentation in her lower back.                                  Set-up films were reviewed.                                 The chart was checked. Physical Findings. . .  oral temperature is 97.7 F (36.5 C). Her blood pressure is 129/52 (abnormal) and her pulse is 77. Her respiration is 18 and oxygen saturation is 100%. . The patient presents in a wheelchair. Lungs are clear to auscultation bilaterally. Heart has regular rate and rhythm. Impression . . . . . . . The patient is tolerating radiation. Plan . . . . . . . . . . . . Continue treatment as planned. I will prescribe Compazine for her nausea. ________________________________   Blair Promise, PhD, MD  This document serves as a record of services personally performed by Gery Pray, MD. It was created on his behalf by Darcus Austin, a trained medical scribe. The creation of this record is based on the scribe's personal observations and the provider's statements to them. This document has been checked and approved by the attending provider.

## 2016-09-10 NOTE — Progress Notes (Addendum)
Debra Ruiz completed 5th fraction today.  Patient reports having pain in lower back which is usually rating a 5 but reports worse with spasms.  Reports energy level is slightly improving, able to move with walker.  Appetite decreased.  Slight hyperpigmentation to lower back.  Radiation education completed by Delia Chimes.  Vitals:   09/10/16 1058  BP: (!) 129/52  Pulse: 77  Resp: 18  Temp: 97.7 F (36.5 C)  TempSrc: Oral  SpO2: 100%   Unable to stand for weight remained in wheelchair. Last known weight 08/20/2016 118 lb

## 2016-09-10 NOTE — Progress Notes (Signed)
Pt here for patient teaching.  Pt given Radiation and You booklet and biafine cream. Reviewed areas of pertinence such as fatigue, nausea and vomiting and skin changes . Pt able to give teach back of applying biafine BID, to pat skin and use unscented/gentle soap,avoid applying anything to skin within 4 hours of treatment. Pt demonstrated understanding and verbalizes understanding of information given and will contact nursing with any questions or concerns.

## 2016-09-11 ENCOUNTER — Ambulatory Visit (HOSPITAL_BASED_OUTPATIENT_CLINIC_OR_DEPARTMENT_OTHER): Payer: PPO

## 2016-09-11 ENCOUNTER — Ambulatory Visit: Payer: PPO

## 2016-09-11 ENCOUNTER — Other Ambulatory Visit: Payer: Self-pay | Admitting: Radiation Oncology

## 2016-09-11 DIAGNOSIS — C9000 Multiple myeloma not having achieved remission: Secondary | ICD-10-CM

## 2016-09-11 LAB — COMPREHENSIVE METABOLIC PANEL
ALT: 21 U/L (ref 0–55)
ANION GAP: 12 meq/L — AB (ref 3–11)
AST: 15 U/L (ref 5–34)
Albumin: 3.6 g/dL (ref 3.5–5.0)
Alkaline Phosphatase: 108 U/L (ref 40–150)
BUN: 22 mg/dL (ref 7.0–26.0)
CALCIUM: 8.3 mg/dL — AB (ref 8.4–10.4)
CO2: 26 mEq/L (ref 22–29)
CREATININE: 0.9 mg/dL (ref 0.6–1.1)
Chloride: 93 mEq/L — ABNORMAL LOW (ref 98–109)
EGFR: 69 mL/min/{1.73_m2} — AB (ref >90)
Glucose: 123 mg/dl (ref 70–140)
Potassium: 2.5 mEq/L — CL (ref 3.5–5.1)
Sodium: 131 mEq/L — ABNORMAL LOW (ref 136–145)
Total Bilirubin: 0.64 mg/dL (ref 0.20–1.20)
Total Protein: >10 g/dL — ABNORMAL HIGH (ref 6.4–8.3)

## 2016-09-11 LAB — CBC WITH DIFFERENTIAL/PLATELET
BASO%: 0.2 % (ref 0.0–2.0)
Basophils Absolute: 0 10*3/uL (ref 0.0–0.1)
EOS ABS: 0.1 10*3/uL (ref 0.0–0.5)
EOS%: 0.9 % (ref 0.0–7.0)
HEMATOCRIT: 32.6 % — AB (ref 34.8–46.6)
HEMOGLOBIN: 11.2 g/dL — AB (ref 11.6–15.9)
LYMPH%: 14.2 % (ref 14.0–49.7)
MCH: 29.6 pg (ref 25.1–34.0)
MCHC: 34.4 g/dL (ref 31.5–36.0)
MCV: 86 fL (ref 79.5–101.0)
MONO#: 0.5 10*3/uL (ref 0.1–0.9)
MONO%: 8.3 % (ref 0.0–14.0)
NEUT%: 76.4 % (ref 38.4–76.8)
NEUTROS ABS: 4.4 10*3/uL (ref 1.5–6.5)
PLATELETS: 230 10*3/uL (ref 145–400)
RBC: 3.79 10*6/uL (ref 3.70–5.45)
RDW: 16 % — AB (ref 11.2–14.5)
WBC: 5.8 10*3/uL (ref 3.9–10.3)
lymph#: 0.8 10*3/uL — ABNORMAL LOW (ref 0.9–3.3)

## 2016-09-11 MED ORDER — PROCHLORPERAZINE MALEATE 10 MG PO TABS: 10.0000 mg | ORAL_TABLET | Freq: Four times a day (QID) | ORAL | 1 refills | Status: DC | PRN

## 2016-09-11 NOTE — Progress Notes (Signed)
K today 2.5, Dr. Marin Olp on PAL.  Per on call MD Alen Blew) pt should take home dose as instructed and refill through primary care if needed.  No additional replacement potassium ordered.  Pt instructed to take potassium as soon as she returns to home.  Pt and husband verbalized understanding.

## 2016-09-11 NOTE — Progress Notes (Signed)
Pt presented today ( with husband and sitter) for chemo treatment, c/o nausea and vomiting. Potassium 2.5. Pt refused chemo and radiation due to feeling so poorly. Dr. Keene Breath notified by charge nurse of low K. Advised patient to take missed dose as soon as she returned home. Vital signs taken and documented. Patient and family advised to return for next scheduled appointments. Patient escorted to her car via wheelchair at 1125.

## 2016-09-12 ENCOUNTER — Ambulatory Visit
Admission: RE | Admit: 2016-09-12 | Discharge: 2016-09-12 | Disposition: A | Discharge status: Home / Self Care | Payer: PPO | Source: Ambulatory Visit | Attending: Radiation Oncology | Admitting: Radiation Oncology

## 2016-09-12 DIAGNOSIS — C9000 Multiple myeloma not having achieved remission: Secondary | ICD-10-CM | POA: Diagnosis not present

## 2016-09-12 DIAGNOSIS — C7951 Secondary malignant neoplasm of bone: Secondary | ICD-10-CM | POA: Diagnosis not present

## 2016-09-12 LAB — IGG, IGA, IGM
IGA/IMMUNOGLOBULIN A, SERUM: 18 mg/dL — AB (ref 87–352)
IGM (IMMUNOGLOBIN M), SRM: 22 mg/dL — AB (ref 26–217)
IgG, Qn, Serum: 4400 mg/dL — ABNORMAL HIGH (ref 700–1600)

## 2016-09-12 LAB — BETA 2 MICROGLOBULIN, SERUM: BETA 2: 2.2 mg/L (ref 0.6–2.4)

## 2016-09-12 LAB — KAPPA/LAMBDA LIGHT CHAINS
Ig Kappa Free Light Chain: 27.2 mg/L — ABNORMAL HIGH (ref 3.3–19.4)
Ig Lambda Free Light Chain: 3.4 mg/L — ABNORMAL LOW (ref 5.7–26.3)
Kappa/Lambda FluidC Ratio: 8 — ABNORMAL HIGH (ref 0.26–1.65)

## 2016-09-13 ENCOUNTER — Ambulatory Visit
Admission: RE | Admit: 2016-09-13 | Discharge: 2016-09-13 | Disposition: A | Discharge status: Home / Self Care | Payer: PPO | Source: Ambulatory Visit | Attending: Radiation Oncology | Admitting: Radiation Oncology

## 2016-09-13 ENCOUNTER — Ambulatory Visit: Payer: PPO | Admitting: Nutrition

## 2016-09-13 DIAGNOSIS — C7951 Secondary malignant neoplasm of bone: Secondary | ICD-10-CM | POA: Diagnosis not present

## 2016-09-13 DIAGNOSIS — C9000 Multiple myeloma not having achieved remission: Secondary | ICD-10-CM | POA: Diagnosis not present

## 2016-09-13 NOTE — Progress Notes (Signed)
Patient requesting fact sheets on nutrition. States she feels weak and has no energy. Wants to know what she should be eating. Brief education provided on high calorie, high protein diet. Recommended patient consume oral nutrition supplements. Provided fact sheets and samples. Gave contact information to patient in case she would like to make an appointment.

## 2016-09-16 ENCOUNTER — Ambulatory Visit
Admission: RE | Admit: 2016-09-16 | Discharge: 2016-09-16 | Disposition: A | Discharge status: Home / Self Care | Payer: PPO | Source: Ambulatory Visit | Attending: Radiation Oncology | Admitting: Radiation Oncology

## 2016-09-16 DIAGNOSIS — C7951 Secondary malignant neoplasm of bone: Secondary | ICD-10-CM | POA: Diagnosis not present

## 2016-09-16 DIAGNOSIS — C9000 Multiple myeloma not having achieved remission: Secondary | ICD-10-CM | POA: Diagnosis not present

## 2016-09-16 LAB — PROTEIN ELECTROPHORESIS, SERUM, WITH REFLEX
A/G Ratio: 0.7 (ref 0.7–1.7)
ALPHA 1: 0.4 g/dL (ref 0.0–0.4)
Albumin: 3.9 g/dL (ref 2.9–4.4)
Alpha 2: 0.8 g/dL (ref 0.4–1.0)
Beta: 1.1 g/dL (ref 0.7–1.3)
GAMMA GLOBULIN: 3.5 g/dL — AB (ref 0.4–1.8)
GLOBULIN, TOTAL: 5.8 g/dL — AB (ref 2.2–3.9)
Interpretation(See Below): 0
M-SPIKE, %: 3.4 g/dL — AB
Total Protein: 9.7 g/dL — ABNORMAL HIGH (ref 6.0–8.5)

## 2016-09-17 ENCOUNTER — Ambulatory Visit: Payer: PPO

## 2016-09-17 ENCOUNTER — Encounter: Payer: Self-pay | Admitting: Radiation Oncology

## 2016-09-17 ENCOUNTER — Telehealth: Payer: Self-pay | Admitting: *Deleted

## 2016-09-17 ENCOUNTER — Ambulatory Visit
Admission: RE | Admit: 2016-09-17 | Discharge: 2016-09-17 | Disposition: A | Discharge status: Home / Self Care | Payer: PPO | Source: Ambulatory Visit | Attending: Radiation Oncology | Admitting: Radiation Oncology

## 2016-09-17 VITALS — BP 115/63 | HR 99 | Temp 98.4°F

## 2016-09-17 DIAGNOSIS — C7951 Secondary malignant neoplasm of bone: Secondary | ICD-10-CM | POA: Diagnosis not present

## 2016-09-17 DIAGNOSIS — C9000 Multiple myeloma not having achieved remission: Secondary | ICD-10-CM | POA: Diagnosis not present

## 2016-09-17 NOTE — Progress Notes (Signed)
  Radiation Oncology         [phone] ________________________________  Name: Debra Ruiz MRN: [id number]  Date: 09/17/2016  DOB: 10-01-1945  Weekly Radiation Therapy Management    ICD-9-CM ICD-10-CM   1. Multiple myeloma not having achieved remission ([place]) 203.00 C90.00      Current Dose: 18 Gy     Planned Dose:  30 Gy  Narrative . . . . . . . . The patient presents for routine under treatment assessment.                                    Debra Ruiz has compelted 9 fractions to her spine. She reports her lower back pain has improved. She reports feeling fatigued. She denies nausea or diarrhea. She will have an infusion tomorrow.  The nurse notes the skin on her back is pink. She is drinking boost to maintain her weight and stength. The patient is on decadron. She denies a sore throat or difficulty swallowing.                                  Set-up films were reviewed.                                 The chart was checked. Physical Findings. . .  oral temperature is 98.4 F (36.9 C). Her blood pressure is 115/63 and her pulse is 99. Her oxygen saturation is 100%. . The patient presents in a wheelchair. Lungs are clear to auscultation bilaterally. Heart has regular rate and rhythm. Abdomen soft, non-tender, normal bowel sounds. Impression . . . . . . . The patient is tolerating radiation. Plan . . . . . . . . . . . . Continue treatment as planned. ________________________________   Blair Promise, PhD, MD  This document serves as a record of services personally performed by Gery Pray, MD. It was created on his behalf by Darcus Austin, a trained medical scribe. The creation of this record is based on the scribe's personal observations and the provider's statements to them. This document has been checked and approved by the attending provider.

## 2016-09-17 NOTE — Telephone Encounter (Addendum)
Patient aware of results.   ----- Message from [name], MD sent at 09/16/2016  5:49 PM EST ----- Call and let her know that the myeloma is responding nicely. Her myeloma level went from 4.1 down to 3.4 and basically one month. pete

## 2016-09-17 NOTE — Progress Notes (Signed)
Debra Ruiz has compelted 9 fractions to her spine.  She reports her lower back pain has improved.  She is taking baclofen only for pain.  She reports feeling fatigued.  She denies having any nausea or diarrhea.  She will have an injection tomorrow.  The skin on her back is pink.  BP 115/63 (BP Location: Right Arm, Patient Position: Sitting)   Pulse 99   Temp 98.4 F (36.9 C) (Oral)   SpO2 100%    Wt Readings from Last 3 Encounters:  08/20/16 118 lb 11.2 oz (53.8 kg)  08/19/16 120 lb (54.4 kg)  07/09/16 125 lb (56.7 kg)

## 2016-09-18 ENCOUNTER — Other Ambulatory Visit (HOSPITAL_BASED_OUTPATIENT_CLINIC_OR_DEPARTMENT_OTHER): Payer: PPO

## 2016-09-18 ENCOUNTER — Ambulatory Visit
Admission: RE | Admit: 2016-09-18 | Discharge: 2016-09-18 | Disposition: A | Discharge status: Home / Self Care | Payer: PPO | Source: Ambulatory Visit | Attending: Radiation Oncology | Admitting: Radiation Oncology

## 2016-09-18 ENCOUNTER — Ambulatory Visit (HOSPITAL_BASED_OUTPATIENT_CLINIC_OR_DEPARTMENT_OTHER): Payer: PPO

## 2016-09-18 ENCOUNTER — Telehealth: Payer: Self-pay | Admitting: *Deleted

## 2016-09-18 VITALS — BP 136/69 | HR 80 | Temp 98.0°F | Resp 18

## 2016-09-18 DIAGNOSIS — C9000 Multiple myeloma not having achieved remission: Secondary | ICD-10-CM

## 2016-09-18 DIAGNOSIS — C7951 Secondary malignant neoplasm of bone: Secondary | ICD-10-CM | POA: Diagnosis not present

## 2016-09-18 DIAGNOSIS — Z5112 Encounter for antineoplastic immunotherapy: Secondary | ICD-10-CM

## 2016-09-18 LAB — CBC WITH DIFFERENTIAL/PLATELET
BASO%: 0.6 % (ref 0.0–2.0)
Basophils Absolute: 0 10*3/uL (ref 0.0–0.1)
EOS%: 1.9 % (ref 0.0–7.0)
Eosinophils Absolute: 0.1 10*3/uL (ref 0.0–0.5)
HEMATOCRIT: 30 % — AB (ref 34.8–46.6)
HGB: 10.3 g/dL — ABNORMAL LOW (ref 11.6–15.9)
LYMPH#: 0.8 10*3/uL — AB (ref 0.9–3.3)
LYMPH%: 25.9 % (ref 14.0–49.7)
MCH: 29.2 pg (ref 25.1–34.0)
MCHC: 34.3 g/dL (ref 31.5–36.0)
MCV: 85 fL (ref 79.5–101.0)
MONO#: 0.2 10*3/uL (ref 0.1–0.9)
MONO%: 7.2 % (ref 0.0–14.0)
NEUT%: 64.4 % (ref 38.4–76.8)
NEUTROS ABS: 2.1 10*3/uL (ref 1.5–6.5)
PLATELETS: 108 10*3/uL — AB (ref 145–400)
RBC: 3.53 10*6/uL — ABNORMAL LOW (ref 3.70–5.45)
RDW: 17.2 % — ABNORMAL HIGH (ref 11.2–14.5)
WBC: 3.2 10*3/uL — AB (ref 3.9–10.3)

## 2016-09-18 LAB — COMPREHENSIVE METABOLIC PANEL
ALT: 73 U/L — AB (ref 0–55)
AST: 19 U/L (ref 5–34)
Albumin: 3 g/dL — ABNORMAL LOW (ref 3.5–5.0)
Alkaline Phosphatase: 117 U/L (ref 40–150)
Anion Gap: 9 mEq/L (ref 3–11)
BUN: 19.2 mg/dL (ref 7.0–26.0)
CALCIUM: 7.8 mg/dL — AB (ref 8.4–10.4)
CHLORIDE: 102 meq/L (ref 98–109)
CO2: 25 meq/L (ref 22–29)
Creatinine: 0.7 mg/dL (ref 0.6–1.1)
EGFR: 90 mL/min/{1.73_m2} — ABNORMAL LOW (ref >90)
Glucose: 117 mg/dl (ref 70–140)
POTASSIUM: 3.1 meq/L — AB (ref 3.5–5.1)
Sodium: 137 mEq/L (ref 136–145)
Total Bilirubin: 0.61 mg/dL (ref 0.20–1.20)
Total Protein: 7.6 g/dL (ref 6.4–8.3)

## 2016-09-18 MED ORDER — BORTEZOMIB CHEMO SQ INJECTION 3.5 MG (2.5MG/ML)
1.3000 mg/m2 | Freq: Once | INTRAMUSCULAR | Status: AC
  Administered 2016-09-18: 2 mg via SUBCUTANEOUS
  Filled 2016-09-18: qty 2

## 2016-09-18 MED ORDER — PROCHLORPERAZINE MALEATE 10 MG PO TABS
ORAL_TABLET | ORAL | Status: AC
  Filled 2016-09-18: qty 1

## 2016-09-18 MED ORDER — PROCHLORPERAZINE MALEATE 10 MG PO TABS
10.0000 mg | ORAL_TABLET | Freq: Once | ORAL | Status: AC
  Administered 2016-09-18: 10 mg via ORAL

## 2016-09-18 NOTE — Patient Instructions (Signed)
[facility] Discharge Instructions for Patients Receiving Chemotherapy  Today you received the following chemotherapy agents Velcade. To help prevent nausea and vomiting after your treatment, we encourage you to take your nausea medication as directed.  If you develop nausea and vomiting that is not controlled by your nausea medication, call the clinic.   BELOW ARE SYMPTOMS THAT SHOULD BE REPORTED IMMEDIATELY:  *FEVER GREATER THAN 100.5 F  *CHILLS WITH OR WITHOUT FEVER  NAUSEA AND VOMITING THAT IS NOT CONTROLLED WITH YOUR NAUSEA MEDICATION  *UNUSUAL SHORTNESS OF BREATH  *UNUSUAL BRUISING OR BLEEDING  TENDERNESS IN MOUTH AND THROAT WITH OR WITHOUT PRESENCE OF ULCERS  *URINARY PROBLEMS  *BOWEL PROBLEMS  UNUSUAL RASH Items with * indicate a potential emergency and should be followed up as soon as possible.  Feel free to call the clinic you have any questions or concerns. The clinic phone number is [phone].  Please show the CHEMO ALERT CARD at check-in to the Emergency Department and triage nurse.    

## 2016-09-18 NOTE — Telephone Encounter (Signed)
Oral Chemotherapy Follow-Up Form Original Start date of oral chemotherapy: 09/03/16 ? Called patient today to follow up regarding patient's oral chemotherapy medication: Revlimid 25mg  D1-21 Pt is doing well today Pt reports 0 tablets/doses missed in the last week/month.  Pt reports the following side effects: None

## 2016-09-19 ENCOUNTER — Ambulatory Visit
Admission: RE | Admit: 2016-09-19 | Discharge: 2016-09-19 | Disposition: A | Discharge status: Home / Self Care | Payer: PPO | Source: Ambulatory Visit | Attending: Radiation Oncology | Admitting: Radiation Oncology

## 2016-09-19 ENCOUNTER — Other Ambulatory Visit: Payer: Self-pay | Admitting: *Deleted

## 2016-09-19 DIAGNOSIS — C7951 Secondary malignant neoplasm of bone: Secondary | ICD-10-CM | POA: Diagnosis not present

## 2016-09-19 DIAGNOSIS — C9000 Multiple myeloma not having achieved remission: Secondary | ICD-10-CM | POA: Diagnosis not present

## 2016-09-19 MED ORDER — LENALIDOMIDE 25 MG PO CAPS: ORAL_CAPSULE | ORAL | 0 refills | Status: DC

## 2016-09-20 ENCOUNTER — Ambulatory Visit
Admission: RE | Admit: 2016-09-20 | Discharge: 2016-09-20 | Disposition: A | Discharge status: Home / Self Care | Payer: PPO | Source: Ambulatory Visit | Attending: Radiation Oncology | Admitting: Radiation Oncology

## 2016-09-20 DIAGNOSIS — C7951 Secondary malignant neoplasm of bone: Secondary | ICD-10-CM | POA: Diagnosis not present

## 2016-09-20 DIAGNOSIS — C9000 Multiple myeloma not having achieved remission: Secondary | ICD-10-CM | POA: Diagnosis not present

## 2016-09-23 ENCOUNTER — Ambulatory Visit
Admission: RE | Admit: 2016-09-23 | Discharge: 2016-09-23 | Disposition: A | Discharge status: Home / Self Care | Payer: PPO | Source: Ambulatory Visit | Attending: Radiation Oncology | Admitting: Radiation Oncology

## 2016-09-23 DIAGNOSIS — C7951 Secondary malignant neoplasm of bone: Secondary | ICD-10-CM | POA: Diagnosis not present

## 2016-09-23 DIAGNOSIS — C9000 Multiple myeloma not having achieved remission: Secondary | ICD-10-CM | POA: Diagnosis not present

## 2016-09-24 ENCOUNTER — Other Ambulatory Visit: Payer: Self-pay

## 2016-09-24 ENCOUNTER — Telehealth: Payer: Self-pay | Admitting: *Deleted

## 2016-09-24 ENCOUNTER — Other Ambulatory Visit: Payer: Self-pay | Admitting: Hematology & Oncology

## 2016-09-24 ENCOUNTER — Ambulatory Visit: Payer: PPO

## 2016-09-24 ENCOUNTER — Encounter: Payer: Self-pay | Admitting: Radiation Oncology

## 2016-09-24 ENCOUNTER — Ambulatory Visit
Admission: RE | Admit: 2016-09-24 | Discharge: 2016-09-24 | Disposition: A | Discharge status: Home / Self Care | Payer: PPO | Source: Ambulatory Visit | Attending: Radiation Oncology | Admitting: Radiation Oncology

## 2016-09-24 VITALS — BP 127/62 | HR 83 | Temp 98.1°F

## 2016-09-24 DIAGNOSIS — C9000 Multiple myeloma not having achieved remission: Secondary | ICD-10-CM

## 2016-09-24 DIAGNOSIS — C7951 Secondary malignant neoplasm of bone: Secondary | ICD-10-CM | POA: Diagnosis not present

## 2016-09-24 NOTE — Telephone Encounter (Signed)
Per staff message I have scheduled appts and notified the scheduler in [facility] office

## 2016-09-24 NOTE — Progress Notes (Signed)
  Radiation Oncology         [phone] ________________________________  Name: Debra Ruiz MRN: [id number]  Date: 09/24/2016  DOB: [birth year]  Weekly Radiation Therapy Management    ICD-9-CM ICD-10-CM   1. Multiple myeloma not having achieved remission ([place]) 203.00 C90.00      Current Dose: 28 Gy     Planned Dose:  30 Gy  Narrative . . . . . . . . The patient presents for routine under treatment assessment.                                    Debra Ruiz has completed 9 fractions to her T11-S3 spine.  She reports her pain in her lower back has improved, but is still having back spasms and is worried about them going away. She is not taking any pain medication. She is taking baclofen for the spasms and using a heating pad. She reports having alternating diarrhea and constipation.  She reports her appetite is improving.  She will have her next chemotherapy infusion tomorrow.  She reports having fatigue.  The nurse notes skin on her lower back is intact. The patient reports her back pain has improved with radiation therapy.                                  Set-up films were reviewed.                                 The chart was checked. Physical Findings. . .  oral temperature is 98.1 F (36.7 C). Her blood pressure is 127/62 and her pulse is 83. Her oxygen saturation is 100%. . The patient presents in a wheelchair. Lungs are clear to auscultation bilaterally. Heart has regular rate and rhythm. Abdomen soft, non-tender, normal bowel sounds. Impression . . . . . . . The patient is tolerating radiation. Her back spasms could be caused from lying on the hard treatment table. Patient is moving around better with less back pain. Plan . . . . . . . . . . . . Continue treatment as planned. ________________________________   Blair Promise, PhD, MD  This document serves as a record of services personally performed by Gery Pray, MD. It was created on his behalf by Darcus Austin, a trained medical  scribe. The creation of this record is based on the scribe's personal observations and the provider's statements to them. This document has been checked and approved by the attending provider.

## 2016-09-24 NOTE — Progress Notes (Signed)
Debra Ruiz has completed 9 fractions to her T11-S3 spine.  She reports her pain in her lower back has improved but is still having back spasms and is worried about them going away.  She is not taking any pain medication.  She is taking baclofen for the spasms and using a heating pad.  She reports having alternating diarrhea and constipation.  She reports her appetite is improving.  She will have her next chemotherapy infusion tomorrow.  She has been given a one month follow up appointment.  She reports having fatigue.  The skin on her lower back is intact.  BP 127/62 (BP Location: Right Arm, Patient Position: Sitting)   Pulse 83   Temp 98.1 F (36.7 C) (Oral)   SpO2 100%

## 2016-09-25 ENCOUNTER — Ambulatory Visit (HOSPITAL_BASED_OUTPATIENT_CLINIC_OR_DEPARTMENT_OTHER): Payer: PPO

## 2016-09-25 ENCOUNTER — Ambulatory Visit
Admission: RE | Admit: 2016-09-25 | Discharge: 2016-09-25 | Disposition: A | Discharge status: Home / Self Care | Payer: PPO | Source: Ambulatory Visit | Attending: Radiation Oncology | Admitting: Radiation Oncology

## 2016-09-25 ENCOUNTER — Other Ambulatory Visit (HOSPITAL_BASED_OUTPATIENT_CLINIC_OR_DEPARTMENT_OTHER): Payer: PPO

## 2016-09-25 ENCOUNTER — Encounter: Payer: Self-pay | Admitting: Radiation Oncology

## 2016-09-25 ENCOUNTER — Ambulatory Visit: Payer: PPO

## 2016-09-25 ENCOUNTER — Ambulatory Visit (HOSPITAL_BASED_OUTPATIENT_CLINIC_OR_DEPARTMENT_OTHER): Payer: PPO | Admitting: Hematology & Oncology

## 2016-09-25 ENCOUNTER — Other Ambulatory Visit: Payer: PPO

## 2016-09-25 VITALS — BP 139/68 | HR 99 | Temp 97.8°F | Resp 20 | Wt 111.8 lb

## 2016-09-25 VITALS — BP 133/83 | HR 91 | Temp 97.9°F | Resp 18

## 2016-09-25 DIAGNOSIS — Z5112 Encounter for antineoplastic immunotherapy: Secondary | ICD-10-CM | POA: Diagnosis not present

## 2016-09-25 DIAGNOSIS — C9000 Multiple myeloma not having achieved remission: Secondary | ICD-10-CM | POA: Diagnosis not present

## 2016-09-25 DIAGNOSIS — C7951 Secondary malignant neoplasm of bone: Secondary | ICD-10-CM | POA: Diagnosis not present

## 2016-09-25 LAB — COMPREHENSIVE METABOLIC PANEL
ALBUMIN: 3.1 g/dL — AB (ref 3.5–5.0)
ALK PHOS: 180 U/L — AB (ref 40–150)
ALT: 23 U/L (ref 0–55)
ANION GAP: 7 meq/L (ref 3–11)
AST: 13 U/L (ref 5–34)
BILIRUBIN TOTAL: 0.48 mg/dL (ref 0.20–1.20)
BUN: 11.4 mg/dL (ref 7.0–26.0)
CO2: 25 meq/L (ref 22–29)
CREATININE: 0.6 mg/dL (ref 0.6–1.1)
Calcium: 8.1 mg/dL — ABNORMAL LOW (ref 8.4–10.4)
Chloride: 103 mEq/L (ref 98–109)
EGFR: >90 mL/min/{1.73_m2} (ref >90)
GLUCOSE: 118 mg/dL (ref 70–140)
Potassium: 3.5 mEq/L (ref 3.5–5.1)
SODIUM: 136 meq/L (ref 136–145)
TOTAL PROTEIN: 6.4 g/dL (ref 6.4–8.3)

## 2016-09-25 LAB — CBC WITH DIFFERENTIAL/PLATELET
BASO%: 0.2 % (ref 0.0–2.0)
Basophils Absolute: 0 10*3/uL (ref 0.0–0.1)
EOS ABS: 0.1 10*3/uL (ref 0.0–0.5)
EOS%: 1.7 % (ref 0.0–7.0)
HCT: 29.2 % — ABNORMAL LOW (ref 34.8–46.6)
HEMOGLOBIN: 9.8 g/dL — AB (ref 11.6–15.9)
LYMPH%: 8.1 % — AB (ref 14.0–49.7)
MCH: 29.7 pg (ref 25.1–34.0)
MCHC: 33.6 g/dL (ref 31.5–36.0)
MCV: 88.4 fL (ref 79.5–101.0)
MONO#: 0.2 10*3/uL (ref 0.1–0.9)
MONO%: 5.9 % (ref 0.0–14.0)
NEUT%: 84.1 % — ABNORMAL HIGH (ref 38.4–76.8)
NEUTROS ABS: 2.9 10*3/uL (ref 1.5–6.5)
PLATELETS: 187 10*3/uL (ref 145–400)
RBC: 3.3 10*6/uL — AB (ref 3.70–5.45)
RDW: 19.4 % — AB (ref 11.2–14.5)
WBC: 3.4 10*3/uL — AB (ref 3.9–10.3)
lymph#: 0.3 10*3/uL — ABNORMAL LOW (ref 0.9–3.3)

## 2016-09-25 LAB — TECHNOLOGIST REVIEW

## 2016-09-25 MED ORDER — BORTEZOMIB CHEMO SQ INJECTION 3.5 MG (2.5MG/ML)
1.3000 mg/m2 | Freq: Once | INTRAMUSCULAR | Status: AC
  Administered 2016-09-25: 2 mg via SUBCUTANEOUS
  Filled 2016-09-25: qty 2

## 2016-09-25 MED ORDER — PROCHLORPERAZINE MALEATE 10 MG PO TABS: 10.0000 mg | ORAL_TABLET | Freq: Once | ORAL | Status: DC

## 2016-09-25 MED ORDER — TRAMADOL HCL 50 MG PO TABS: ORAL_TABLET | ORAL | 0 refills | Status: DC

## 2016-09-25 NOTE — Progress Notes (Signed)
Patient states she has chronic lower back pain and back spasms. Patient rates the pain a 6 on the 0 to 10 pain scale. Patient reports she took her prescription pain medication along with her zofran before approximately around 10 am. Patient took another dose of her home prescription of pain medication before discharge. Patient discharged via wheelchair with spouse.

## 2016-09-25 NOTE — Patient Instructions (Signed)
[facility] Discharge Instructions for Patients Receiving Chemotherapy  Today you received the following chemotherapy agents:  Velcade  To help prevent nausea and vomiting after your treatment, we encourage you to take your nausea medication as prescribed.   If you develop nausea and vomiting that is not controlled by your nausea medication, call the clinic.   BELOW ARE SYMPTOMS THAT SHOULD BE REPORTED IMMEDIATELY:  *FEVER GREATER THAN 100.5 F  *CHILLS WITH OR WITHOUT FEVER  NAUSEA AND VOMITING THAT IS NOT CONTROLLED WITH YOUR NAUSEA MEDICATION  *UNUSUAL SHORTNESS OF BREATH  *UNUSUAL BRUISING OR BLEEDING  TENDERNESS IN MOUTH AND THROAT WITH OR WITHOUT PRESENCE OF ULCERS  *URINARY PROBLEMS  *BOWEL PROBLEMS  UNUSUAL RASH Items with * indicate a potential emergency and should be followed up as soon as possible.  Feel free to call the clinic you have any questions or concerns. The clinic phone number is [phone].  Please show the CHEMO ALERT CARD at check-in to the Emergency Department and triage nurse.   

## 2016-09-25 NOTE — Progress Notes (Signed)
Hematology and Oncology Follow Up Visit  Debra Ruiz [id number] 01/03/1946 71 y.o. 09/25/2016   Principle Diagnosis:   IgG Kappa myeloma - compression fractures - 13q-, +11, 14q+ - hyperdiploid  Current Therapy:    S/p Kyphoplasty of T11 and T12  Velcade/Revlimid/Decadron -status post cycle #1  XRT to lower spine-completed on 09/25/2016  Xgeva '120mg'$  Sq q month     Interim History:  Ms. Debra Ruiz is back for follow-up. She has got through her first cycle of treatment pretty well. Unfortunately, I'm absolutely amazed that they have to pay $3000 for her Revlimid. I think this is totally ridiculous. I will have to see if we can't get assistance for them.  She completed radiation therapy today for her back. She still is on a rolling walker. She still has quite a bit of back discomfort. She has had chondroplasties.  She is not taking any narcotic medication. She does not want to quit "addicted". She was taking some baclofen. I went ahead and gave her a prescription for Ultram.   Thankfully, the treatment is Arty helped. Her M spike has come down to 3.4 last time we checked. Her IgGG level went from 5100 mg/dL down to 4100 mg/dL. Her Kappa Lightchain went from 67 mg/dL down to 2.8 mg/dL.   Her weight is down. She is not eating that much. She says she does not have much taste for food. I told her to try some water and baking soda to help with the mouth.   She has had no fever. She's had no rashes.  Overall, I will see her performance status is ECOG 2.   Medications:  Current Outpatient Prescriptions:  .  ALPRAZolam (XANAX) 0.25 MG tablet, Take 1 tablet (0.25 mg total) by mouth 2 (two) times daily as needed for anxiety., Disp: 60 tablet, Rfl: 0 .  amLODipine (NORVASC) 5 MG tablet, TAKE 1 TABLET EVERY DAY, Disp: 90 tablet, Rfl: 3 .  atorvastatin (LIPITOR) 20 MG tablet, TAKE 1 TABLET EVERY DAY (Patient taking differently: TAKE 1 TABLET EVERY EVENING), Disp: 90 tablet, Rfl: 1 .  baclofen  (LIORESAL) 10 MG tablet, Take 1 tablet (10 mg total) by mouth every 8 (eight) hours as needed for muscle spasms (Pain). (Patient taking differently: Take 25 mg by mouth every 8 (eight) hours as needed for muscle spasms (Pain). ), Disp: 60 tablet, Rfl: 0 .  dexamethasone (DECADRON) 4 MG tablet, Take 5 pills with food once a week, Disp: 60 tablet, Rfl: 3 .  famciclovir (FAMVIR) 250 MG tablet, Take 1 tablet (250 mg total) by mouth daily., Disp: 30 tablet, Rfl: 12 .  hydrochlorothiazide (HYDRODIURIL) 25 MG tablet, TAKE 1 TABLET EVERY DAY, Disp: 90 tablet, Rfl: 2 .  lenalidomide (REVLIMID) 25 MG capsule, Take 1 capsule daily for 21 days and then off for 7 days [id number], Disp: 21 capsule, Rfl: 0 .  nebivolol (BYSTOLIC) 5 MG tablet, Take 5 mg by mouth daily., Disp: , Rfl:  .  potassium chloride SA (K-DUR,KLOR-CON) 20 MEQ tablet, TAKE 1 TABLET EVERY DAY, Disp: 90 tablet, Rfl: 1 .  prochlorperazine (COMPAZINE) 10 MG tablet, Take 1 tablet (10 mg total) by mouth every 6 (six) hours as needed for nausea or vomiting., Disp: 30 tablet, Rfl: 1 .  fentaNYL (DURAGESIC) 12 MCG/HR, Place 1 patch (12.5 mcg total) onto the skin every 3 (three) days. (Patient not taking: Reported on 09/10/2016), Disp: 10 patch, Rfl: 0 .  Multiple Vitamins-Minerals (CENTRUM SILVER PO), Take 1 tablet by mouth daily.,  Disp: , Rfl:  .  oxycodone (OXY-IR) 5 MG capsule, Take 1 capsule (5 mg total) by mouth every 4 (four) hours as needed for pain. (Patient not taking: Reported on 09/10/2016), Disp: 90 capsule, Rfl: 0 .  traMADol (ULTRAM) 50 MG tablet, Take 1-2 pills, IF NEEDED, every 6 hrs for pain., Disp: 90 tablet, Rfl: 0  Allergies:  Allergies  Allergen Reactions  . Crestor [Rosuvastatin Calcium]     Causes liver functions to be elevated    Past Medical History, Surgical history, Social history, and Family History were reviewed and updated.  Review of Systems: As above  Physical Exam:  weight is 111 lb 12.8 oz (50.7 kg). Her oral  temperature is 97.8 F (36.6 C). Her blood pressure is 139/68 and her pulse is 99. Her respiration is 20.   Wt Readings from Last 3 Encounters:  09/25/16 111 lb 12.8 oz (50.7 kg)  08/20/16 118 lb 11.2 oz (53.8 kg)  08/19/16 120 lb (54.4 kg)     Elderly appearing white female. She is in moderate distress secondary to pain. Her headache exam shows no ocular or oral lesions. She has no palpable cervical or supraclavicular lymph nodes. Lungs are clear bilaterally. Cardiac exam regular rate and rhythm with no murmurs, rubs or bruits. Abdomen is soft. She has good bowel sounds. There is no fluid wave. There is no palpable liver or spleen tip. Back exam shows some slight kyphosis. She has tenderness in the lower thoracic and lumbosacral spine. There may be some slight muscle spasms noted in the paravertebral area. Extremities shows no clubbing, cyanosis or edema. She has decent range of motion of her joints. She has 4/5 strength. Skin exam shows no rashes, ecchymoses or petechia. Neurological exam shows no focal neurological deficits.  Lab Results  Component Value Date   WBC 3.4 (L) 09/25/2016   HGB 9.8 (L) 09/25/2016   HCT 29.2 (L) 09/25/2016   MCV 88.4 09/25/2016   PLT 187 09/25/2016     Chemistry      Component Value Date/Time   NA 136 09/25/2016 1051   K 3.5 09/25/2016 1051   CL 96 (L) 08/19/2016 1106   CO2 25 09/25/2016 1051   BUN 11.4 09/25/2016 1051   CREATININE 0.6 09/25/2016 1051      Component Value Date/Time   CALCIUM 8.1 (L) 09/25/2016 1051   ALKPHOS 180 (H) 09/25/2016 1051   AST 13 09/25/2016 1051   ALT 23 09/25/2016 1051   BILITOT 0.48 09/25/2016 1051         Impression and Plan: Ms. [name] is a 71 year old white female with IgG kappa myeloma. Again, she is responding. We will see what her monoclonal studies show today. I have to believe that her M spike will be down to hopefully will be down to less than 2 g/dL.  She really does not have a lot of patients. I told  her that it may take another 3 or 4 months before she really starts to feel better. Her bones really need to just heal up and go away this can happen is for the myeloma to be eradicated. They chemotherapy and radiation is doing that.  She will start her second cycle of treatment this week.  I will like to see her back in one month.  I spent about 30 minutes with she and her husband.   [name], MD 3/7/20185:07 PM

## 2016-09-30 NOTE — Progress Notes (Signed)
  Radiation Oncology         [phone] ________________________________  Name: Debra Ruiz MRN: [id number]  Date: 09/25/2016  DOB: 24-Feb-1946  End of Treatment Note  Diagnosis:  IgG Kappa myeloma - compression fractures - 13q-, +11, 14q+ - hyperdiploid  Indication for treatment:  Palliative, pain control  Radiation treatment dates:   09/04/16 - 09/25/16  Site/dose:   Spine T11-S3: 30 Gy in 15 fractions  Beams/energy:   3D // [employer]  Narrative: The patient tolerated radiation treatment relatively well. Towards the end of treatment, the patient reported her lower back has improved with radiation therapy, but is still having back spasms. She is not taking any pain medication. She is taking baclofen for the spasms and using a heating pad. She reports having alternating diarrhea and constipation. She reports her appetite is improving.She reports having fatigue. The patient had nausea when she started her radiation therapy and I prescribed compazine that helped. The patient is on decadron and she denies a sore throat or difficulty swallowing.  Plan: The patient has completed radiation treatment. The patient will return to radiation oncology clinic for routine followup in one month. I advised them to call or return sooner if they have any questions or concerns related to their recovery or treatment.  -----------------------------------  Blair Promise, PhD, MD  This document serves as a record of services personally performed by Gery Pray, MD. It was created on his behalf by Darcus Austin, a trained medical scribe. The creation of this record is based on the scribe's personal observations and the provider's statements to them. This document has been checked and approved by the attending provider.

## 2016-10-03 ENCOUNTER — Telehealth: Payer: Self-pay | Admitting: *Deleted

## 2016-10-03 NOTE — Telephone Encounter (Signed)
Patient is due to start Revlimid today, however, her next cycle with Velcade/dex starts next Wednesday. Her cycles got off when a dose was delayed due to not feeling well. She wants to maintain a week without treatment of any kind, so she is going to delay the start of her Revlimid to next Wednesday to coincide with cycle day 1.   Will notify Dr Marin Olp of patient's plan

## 2016-10-09 ENCOUNTER — Ambulatory Visit (HOSPITAL_BASED_OUTPATIENT_CLINIC_OR_DEPARTMENT_OTHER): Payer: PPO

## 2016-10-09 ENCOUNTER — Other Ambulatory Visit: Payer: Self-pay | Admitting: *Deleted

## 2016-10-09 VITALS — BP 120/62 | HR 83 | Temp 98.7°F | Resp 18

## 2016-10-09 DIAGNOSIS — C9000 Multiple myeloma not having achieved remission: Secondary | ICD-10-CM

## 2016-10-09 DIAGNOSIS — Z5112 Encounter for antineoplastic immunotherapy: Secondary | ICD-10-CM | POA: Diagnosis not present

## 2016-10-09 LAB — COMPREHENSIVE METABOLIC PANEL
ALBUMIN: 3.2 g/dL — AB (ref 3.5–5.0)
ALT: 18 U/L (ref 0–55)
ANION GAP: 7 meq/L (ref 3–11)
AST: 12 U/L (ref 5–34)
Alkaline Phosphatase: 181 U/L — ABNORMAL HIGH (ref 40–150)
BUN: 13.3 mg/dL (ref 7.0–26.0)
CALCIUM: 8.8 mg/dL (ref 8.4–10.4)
CO2: 26 mEq/L (ref 22–29)
Chloride: 101 mEq/L (ref 98–109)
Creatinine: 0.6 mg/dL (ref 0.6–1.1)
Glucose: 117 mg/dl (ref 70–140)
POTASSIUM: 3.7 meq/L (ref 3.5–5.1)
Sodium: 133 mEq/L — ABNORMAL LOW (ref 136–145)
Total Bilirubin: 0.49 mg/dL (ref 0.20–1.20)
Total Protein: 5.8 g/dL — ABNORMAL LOW (ref 6.4–8.3)

## 2016-10-09 LAB — CBC WITH DIFFERENTIAL/PLATELET
BASO%: 0.9 % (ref 0.0–2.0)
BASOS ABS: 0 10*3/uL (ref 0.0–0.1)
EOS ABS: 0.1 10*3/uL (ref 0.0–0.5)
EOS%: 2.3 % (ref 0.0–7.0)
HEMATOCRIT: 32.1 % — AB (ref 34.8–46.6)
HEMOGLOBIN: 10.6 g/dL — AB (ref 11.6–15.9)
LYMPH#: 1.1 10*3/uL (ref 0.9–3.3)
LYMPH%: 21.7 % (ref 14.0–49.7)
MCH: 30.3 pg (ref 25.1–34.0)
MCHC: 33.2 g/dL (ref 31.5–36.0)
MCV: 91.2 fL (ref 79.5–101.0)
MONO#: 0.4 10*3/uL (ref 0.1–0.9)
MONO%: 7.9 % (ref 0.0–14.0)
NEUT#: 3.3 10*3/uL (ref 1.5–6.5)
NEUT%: 67.2 % (ref 38.4–76.8)
PLATELETS: 228 10*3/uL (ref 145–400)
RBC: 3.51 10*6/uL — ABNORMAL LOW (ref 3.70–5.45)
RDW: 20.2 % — AB (ref 11.2–14.5)
WBC: 4.9 10*3/uL (ref 3.9–10.3)

## 2016-10-09 MED ORDER — PROCHLORPERAZINE MALEATE 10 MG PO TABS: 10.0000 mg | ORAL_TABLET | Freq: Once | ORAL | Status: DC

## 2016-10-09 MED ORDER — BORTEZOMIB CHEMO SQ INJECTION 3.5 MG (2.5MG/ML)
1.3000 mg/m2 | Freq: Once | INTRAMUSCULAR | Status: AC
  Administered 2016-10-09: 2 mg via SUBCUTANEOUS
  Filled 2016-10-09: qty 2

## 2016-10-09 MED ORDER — DENOSUMAB 120 MG/1.7ML ~~LOC~~ SOLN
120.0000 mg | Freq: Once | SUBCUTANEOUS | Status: AC
  Administered 2016-10-09: 120 mg via SUBCUTANEOUS
  Filled 2016-10-09: qty 1.7

## 2016-10-09 NOTE — Patient Instructions (Signed)
[facility] Discharge Instructions for Patients Receiving Chemotherapy  Today you received the following chemotherapy agents Velcade, Xgeva  To help prevent nausea and vomiting after your treatment, we encourage you to take your nausea medication as directed.   If you develop nausea and vomiting that is not controlled by your nausea medication, call the clinic.   BELOW ARE SYMPTOMS THAT SHOULD BE REPORTED IMMEDIATELY:  *FEVER GREATER THAN 100.5 F  *CHILLS WITH OR WITHOUT FEVER  NAUSEA AND VOMITING THAT IS NOT CONTROLLED WITH YOUR NAUSEA MEDICATION  *UNUSUAL SHORTNESS OF BREATH  *UNUSUAL BRUISING OR BLEEDING  TENDERNESS IN MOUTH AND THROAT WITH OR WITHOUT PRESENCE OF ULCERS  *URINARY PROBLEMS  *BOWEL PROBLEMS  UNUSUAL RASH Items with * indicate a potential emergency and should be followed up as soon as possible.  Feel free to call the clinic you have any questions or concerns. The clinic phone number is [phone].  Please show the CHEMO ALERT CARD at check-in to the Emergency Department and triage nurse.  Bortezomib injection What is this medicine? BORTEZOMIB (bor TEZ oh mib) is a medicine that targets proteins in cancer cells and stops the cancer cells from growing. It is used to treat multiple myeloma and mantle-cell lymphoma. This medicine may be used for other purposes; ask your health care provider or pharmacist if you have questions. COMMON BRAND NAME(S): Velcade What should I tell my health care provider before I take this medicine? They need to know if you have any of these conditions: -diabetes -heart disease -irregular heartbeat -liver disease -on hemodialysis -low blood counts, like low white blood cells, platelets, or hemoglobin -peripheral neuropathy -taking medicine for blood pressure -an unusual or allergic reaction to bortezomib, mannitol, boron, other medicines, foods, dyes, or preservatives -pregnant or trying to get  pregnant -breast-feeding How should I use this medicine? This medicine is for injection into a vein or for injection under the skin. It is given by a health care professional in a hospital or clinic setting. Talk to your pediatrician regarding the use of this medicine in children. Special care may be needed. Overdosage: If you think you have taken too much of this medicine contact a poison control center or emergency room at once. NOTE: This medicine is only for you. Do not share this medicine with others. What if I miss a dose? It is important not to miss your dose. Call your doctor or health care professional if you are unable to keep an appointment. What may interact with this medicine? This medicine may interact with the following medications: -ketoconazole -rifampin -ritonavir -St. John's Wort This list may not describe all possible interactions. Give your health care provider a list of all the medicines, herbs, non-prescription drugs, or dietary supplements you use. Also tell them if you smoke, drink alcohol, or use illegal drugs. Some items may interact with your medicine. What should I watch for while using this medicine? You may get drowsy or dizzy. Do not drive, use machinery, or do anything that needs mental alertness until you know how this medicine affects you. Do not stand or sit up quickly, especially if you are an older patient. This reduces the risk of dizzy or fainting spells. In some cases, you may be given additional medicines to help with side effects. Follow all directions for their use. Call your doctor or health care professional for advice if you get a fever, chills or sore throat, or other symptoms of a cold or flu. Do not treat  yourself. This drug decreases your body's ability to fight infections. Try to avoid being around people who are sick. This medicine may increase your risk to bruise or bleed. Call your doctor or health care professional if you notice any unusual  bleeding. You may need blood work done while you are taking this medicine. In some patients, this medicine may cause a serious brain infection that may cause death. If you have any problems seeing, thinking, speaking, walking, or standing, tell your doctor right away. If you cannot reach your doctor, urgently seek other source of medical care. Check with your doctor or health care professional if you get an attack of severe diarrhea, nausea and vomiting, or if you sweat a lot. The loss of too much body fluid can make it dangerous for you to take this medicine. Do not become pregnant while taking this medicine or for at least 2 months after stopping it. Women should inform their doctor if they wish to become pregnant or think they might be pregnant. Men should not father a child while taking this medicine and for at least 2 months after stopping it. There is a potential for serious side effects to an unborn child. Talk to your health care professional or pharmacist for more information. Do not breast-feed an infant while taking this medicine or for 2 months after stopping it. This medicine may interfere with the ability to have a child. You should talk with your doctor or health care professional if you are concerned about your fertility. What side effects may I notice from receiving this medicine? Side effects that you should report to your doctor or health care professional as soon as possible: -allergic reactions like skin rash, itching or hives, swelling of the face, lips, or tongue -breathing problems -changes in hearing -changes in vision -fast, irregular heartbeat -feeling faint or lightheaded, falls -pain, tingling, numbness in the hands or feet -right upper belly pain -seizures -swelling of the ankles, feet, hands -unusual bleeding or bruising -unusually weak or tired -vomiting -yellowing of the eyes or skin Side effects that usually do not require medical attention (report to your  doctor or health care professional if they continue or are bothersome): -changes in emotions or moods -constipation -diarrhea -loss of appetite -headache -irritation at site where injected -nausea This list may not describe all possible side effects. Call your doctor for medical advice about side effects. You may report side effects to FDA at 1-800-FDA-1088. Where should I keep my medicine? This drug is given in a hospital or clinic and will not be stored at home. NOTE: This sheet is a summary. It may not cover all possible information. If you have questions about this medicine, talk to your doctor, pharmacist, or health care provider.  2017 Elsevier/Gold Standard (2016-01-03 18:30:39)  Denosumab injection What is this medicine? DENOSUMAB (den oh sue mab) slows bone breakdown. Prolia is used to treat osteoporosis in women after menopause and in men. Delton See is used to prevent bone fractures and other bone problems caused by cancer bone metastases. Delton See is also used to treat giant cell tumor of the bone. COMMON BRAND NAME(S): Prolia, XGEVA What should I tell my health care provider before I take this medicine? They need to know if you have any of these conditions: -dental disease -eczema -infection or history of infections -kidney disease or on dialysis -low blood calcium or vitamin D -malabsorption syndrome -scheduled to have surgery or tooth extraction -taking medicine that contains denosumab -thyroid or parathyroid disease -  an unusual reaction to denosumab, other medicines, foods, dyes, or preservatives -pregnant or trying to get pregnant -breast-feeding How should I use this medicine? This medicine is for injection under the skin. It is given by a health care professional in a hospital or clinic setting. If you are getting Prolia, a special MedGuide will be given to you by the pharmacist with each prescription and refill. Be sure to read this information carefully each time. For  Prolia, talk to your pediatrician regarding the use of this medicine in children. Special care may be needed. For Delton See, talk to your pediatrician regarding the use of this medicine in children. While this drug may be prescribed for children as young as 13 years for selected conditions, precautions do apply. What if I miss a dose? It is important not to miss your dose. Call your doctor or health care professional if you are unable to keep an appointment. What may interact with this medicine? Do not take this medicine with any of the following medications: -other medicines containing denosumab This medicine may also interact with the following medications: -medicines that suppress the immune system -medicines that treat cancer -steroid medicines like prednisone or cortisone What should I watch for while using this medicine? Visit your doctor or health care professional for regular checks on your progress. Your doctor or health care professional may order blood tests and other tests to see how you are doing. Call your doctor or health care professional if you get a cold or other infection while receiving this medicine. Do not treat yourself. This medicine may decrease your body's ability to fight infection. You should make sure you get enough calcium and vitamin D while you are taking this medicine, unless your doctor tells you not to. Discuss the foods you eat and the vitamins you take with your health care professional. See your dentist regularly. Brush and floss your teeth as directed. Before you have any dental work done, tell your dentist you are receiving this medicine. Do not become pregnant while taking this medicine or for 5 months after stopping it. Women should inform their doctor if they wish to become pregnant or think they might be pregnant. There is a potential for serious side effects to an unborn child. Talk to your health care professional or pharmacist for more information. What side  effects may I notice from receiving this medicine? Side effects that you should report to your doctor or health care professional as soon as possible: -allergic reactions like skin rash, itching or hives, swelling of the face, lips, or tongue -breathing problems -chest pain -fast, irregular heartbeat -feeling faint or lightheaded, falls -fever, chills, or any other sign of infection -muscle spasms, tightening, or twitches -numbness or tingling -skin blisters or bumps, or is dry, peels, or red -slow healing or unexplained pain in the mouth or jaw -unusual bleeding or bruising Side effects that usually do not require medical attention (report to your doctor or health care professional if they continue or are bothersome): -muscle pain -stomach upset, gas Where should I keep my medicine? This medicine is only given in a clinic, doctor's office, or other health care setting and will not be stored at home.  2017 Elsevier/Gold Standard (2015-08-10 10:06:55)

## 2016-10-10 ENCOUNTER — Telehealth: Payer: Self-pay | Admitting: Hematology & Oncology

## 2016-10-10 ENCOUNTER — Other Ambulatory Visit: Payer: Self-pay | Admitting: *Deleted

## 2016-10-10 DIAGNOSIS — C9000 Multiple myeloma not having achieved remission: Secondary | ICD-10-CM

## 2016-10-10 NOTE — Telephone Encounter (Signed)
Opened in error

## 2016-10-10 NOTE — Telephone Encounter (Signed)
Received call from desk nurse at [place] re standing lab order and adding patient for lab with each chemo scheduled at the [facility]. Lab appointments added. Spoke with patient and she will get new schedule at next visit 3/28.

## 2016-10-15 ENCOUNTER — Other Ambulatory Visit: Payer: Self-pay

## 2016-10-15 MED ORDER — NEBIVOLOL HCL 5 MG PO TABS: 5.0000 mg | ORAL_TABLET | Freq: Every day | ORAL | 2 refills | Status: DC

## 2016-10-15 MED ORDER — AMLODIPINE BESYLATE 5 MG PO TABS: 5.0000 mg | ORAL_TABLET | Freq: Every day | ORAL | 0 refills | Status: DC

## 2016-10-15 NOTE — Telephone Encounter (Signed)
Patient called to request refills on amlodipine and BYSTOLIC. Ok to refill?

## 2016-10-16 ENCOUNTER — Ambulatory Visit (HOSPITAL_BASED_OUTPATIENT_CLINIC_OR_DEPARTMENT_OTHER): Payer: PPO

## 2016-10-16 ENCOUNTER — Other Ambulatory Visit (HOSPITAL_BASED_OUTPATIENT_CLINIC_OR_DEPARTMENT_OTHER): Payer: PPO

## 2016-10-16 VITALS — BP 136/62 | HR 77 | Temp 98.7°F | Resp 18

## 2016-10-16 DIAGNOSIS — C9000 Multiple myeloma not having achieved remission: Secondary | ICD-10-CM | POA: Diagnosis not present

## 2016-10-16 DIAGNOSIS — Z5112 Encounter for antineoplastic immunotherapy: Secondary | ICD-10-CM

## 2016-10-16 LAB — COMPREHENSIVE METABOLIC PANEL
ALT: 25 U/L (ref 0–55)
AST: 16 U/L (ref 5–34)
Albumin: 3.4 g/dL — ABNORMAL LOW (ref 3.5–5.0)
Alkaline Phosphatase: 155 U/L — ABNORMAL HIGH (ref 40–150)
Anion Gap: 9 mEq/L (ref 3–11)
BUN: 11.8 mg/dL (ref 7.0–26.0)
CHLORIDE: 102 meq/L (ref 98–109)
CO2: 25 mEq/L (ref 22–29)
Calcium: 8.9 mg/dL (ref 8.4–10.4)
Creatinine: 0.6 mg/dL (ref 0.6–1.1)
EGFR: >90 mL/min/{1.73_m2} (ref >90)
GLUCOSE: 100 mg/dL (ref 70–140)
POTASSIUM: 3.7 meq/L (ref 3.5–5.1)
Sodium: 135 mEq/L — ABNORMAL LOW (ref 136–145)
Total Bilirubin: 0.53 mg/dL (ref 0.20–1.20)
Total Protein: 6.1 g/dL — ABNORMAL LOW (ref 6.4–8.3)

## 2016-10-16 LAB — CBC WITH DIFFERENTIAL/PLATELET
BASO%: 1.4 % (ref 0.0–2.0)
BASOS ABS: 0.1 10*3/uL (ref 0.0–0.1)
EOS%: 3.1 % (ref 0.0–7.0)
Eosinophils Absolute: 0.2 10*3/uL (ref 0.0–0.5)
HCT: 33.9 % — ABNORMAL LOW (ref 34.8–46.6)
HGB: 11.2 g/dL — ABNORMAL LOW (ref 11.6–15.9)
LYMPH%: 22.4 % (ref 14.0–49.7)
MCH: 30.3 pg (ref 25.1–34.0)
MCHC: 33.2 g/dL (ref 31.5–36.0)
MCV: 91.3 fL (ref 79.5–101.0)
MONO#: 1 10*3/uL — ABNORMAL HIGH (ref 0.1–0.9)
MONO%: 12.4 % (ref 0.0–14.0)
NEUT#: 4.8 10*3/uL (ref 1.5–6.5)
NEUT%: 60.7 % (ref 38.4–76.8)
Platelets: 244 10*3/uL (ref 145–400)
RBC: 3.71 10*6/uL (ref 3.70–5.45)
RDW: 19.9 % — AB (ref 11.2–14.5)
WBC: 8 10*3/uL (ref 3.9–10.3)
lymph#: 1.8 10*3/uL (ref 0.9–3.3)

## 2016-10-16 MED ORDER — BORTEZOMIB CHEMO SQ INJECTION 3.5 MG (2.5MG/ML)
1.3000 mg/m2 | Freq: Once | INTRAMUSCULAR | Status: AC
  Administered 2016-10-16: 2 mg via SUBCUTANEOUS
  Filled 2016-10-16: qty 2

## 2016-10-16 MED ORDER — PROCHLORPERAZINE MALEATE 10 MG PO TABS: 10.0000 mg | ORAL_TABLET | Freq: Once | ORAL | Status: DC

## 2016-10-16 NOTE — Patient Instructions (Signed)
[facility] Discharge Instructions for Patients Receiving Chemotherapy  Today you received the following chemotherapy agents Velcade. To help prevent nausea and vomiting after your treatment, we encourage you to take your nausea medication as directed.  If you develop nausea and vomiting that is not controlled by your nausea medication, call the clinic.   BELOW ARE SYMPTOMS THAT SHOULD BE REPORTED IMMEDIATELY:  *FEVER GREATER THAN 100.5 F  *CHILLS WITH OR WITHOUT FEVER  NAUSEA AND VOMITING THAT IS NOT CONTROLLED WITH YOUR NAUSEA MEDICATION  *UNUSUAL SHORTNESS OF BREATH  *UNUSUAL BRUISING OR BLEEDING  TENDERNESS IN MOUTH AND THROAT WITH OR WITHOUT PRESENCE OF ULCERS  *URINARY PROBLEMS  *BOWEL PROBLEMS  UNUSUAL RASH Items with * indicate a potential emergency and should be followed up as soon as possible.  Feel free to call the clinic you have any questions or concerns. The clinic phone number is [phone].  Please show the CHEMO ALERT CARD at check-in to the Emergency Department and triage nurse.    

## 2016-10-17 ENCOUNTER — Telehealth: Payer: Self-pay | Admitting: *Deleted

## 2016-10-17 ENCOUNTER — Other Ambulatory Visit: Payer: Self-pay

## 2016-10-17 ENCOUNTER — Other Ambulatory Visit: Payer: Self-pay | Admitting: *Deleted

## 2016-10-17 DIAGNOSIS — C9000 Multiple myeloma not having achieved remission: Secondary | ICD-10-CM

## 2016-10-17 LAB — BETA 2 MICROGLOBULIN, SERUM: BETA 2: 1.8 mg/L (ref 0.6–2.4)

## 2016-10-17 LAB — KAPPA/LAMBDA LIGHT CHAINS
Ig Kappa Free Light Chain: 5.4 mg/L (ref 3.3–19.4)
Ig Lambda Free Light Chain: 7.5 mg/L (ref 5.7–26.3)
Kappa/Lambda FluidC Ratio: 0.72 (ref 0.26–1.65)

## 2016-10-17 LAB — IGG, IGA, IGM
IgA, Qn, Serum: 15 mg/dL — ABNORMAL LOW (ref 87–352)
IgG, Qn, Serum: 586 mg/dL — ABNORMAL LOW (ref 700–1600)
IgM, Qn, Serum: 46 mg/dL (ref 26–217)

## 2016-10-17 MED ORDER — POTASSIUM CHLORIDE CRYS ER 20 MEQ PO TBCR: 20.0000 meq | EXTENDED_RELEASE_TABLET | Freq: Every day | ORAL | 1 refills | Status: DC

## 2016-10-17 MED ORDER — LENALIDOMIDE 25 MG PO CAPS: ORAL_CAPSULE | ORAL | 0 refills | Status: DC

## 2016-10-17 NOTE — Telephone Encounter (Signed)
OK to refill x 6 months

## 2016-10-17 NOTE — Telephone Encounter (Addendum)
Patient is aware of results  ----- Message from [name], MD sent at 10/17/2016  6:47 AM EDT ----- Call - it is a miracle!!!  The myeloma protein went from 4400 down to 586!!!!  This is unheard of!!  This is God!!!!  pete

## 2016-10-17 NOTE — Telephone Encounter (Signed)
Patient called to get a refill on potassium, ok to refill?

## 2016-10-21 LAB — PROTEIN ELECTROPHORESIS, SERUM, WITH REFLEX
A/G RATIO SPE: 1.3 (ref 0.7–1.7)
Albumin: 3.2 g/dL (ref 2.9–4.4)
Alpha 1: 0.3 g/dL (ref 0.0–0.4)
Alpha 2: 0.7 g/dL (ref 0.4–1.0)
Beta: 0.9 g/dL (ref 0.7–1.3)
GLOBULIN, TOTAL: 2.4 g/dL (ref 2.2–3.9)
Gamma Globulin: 0.5 g/dL (ref 0.4–1.8)
INTERPRETATION(SEE BELOW): 0
M-SPIKE, %: 0.3 g/dL — AB
TOTAL PROTEIN: 5.6 g/dL — AB (ref 6.0–8.5)

## 2016-10-22 ENCOUNTER — Telehealth: Payer: Self-pay

## 2016-10-22 NOTE — Telephone Encounter (Addendum)
-----   Message from [name], MD sent at 10/21/2016  4:34 PM EDT -----  Call - your myeloma is over 90% gone!!!  This is God's miracle!!!  Debra Ruiz  Above message given to pt via phone. Pt verbalizes understanding and appreciation. dph

## 2016-10-23 ENCOUNTER — Ambulatory Visit (HOSPITAL_BASED_OUTPATIENT_CLINIC_OR_DEPARTMENT_OTHER): Payer: PPO

## 2016-10-23 ENCOUNTER — Other Ambulatory Visit: Payer: PPO

## 2016-10-23 ENCOUNTER — Other Ambulatory Visit (HOSPITAL_BASED_OUTPATIENT_CLINIC_OR_DEPARTMENT_OTHER): Payer: PPO

## 2016-10-23 VITALS — BP 136/55 | HR 71 | Temp 98.3°F | Resp 18

## 2016-10-23 DIAGNOSIS — C9000 Multiple myeloma not having achieved remission: Secondary | ICD-10-CM

## 2016-10-23 DIAGNOSIS — Z5112 Encounter for antineoplastic immunotherapy: Secondary | ICD-10-CM | POA: Diagnosis not present

## 2016-10-23 LAB — COMPREHENSIVE METABOLIC PANEL
ALBUMIN: 3.3 g/dL — AB (ref 3.5–5.0)
ALT: 22 U/L (ref 0–55)
ANION GAP: 9 meq/L (ref 3–11)
AST: 16 U/L (ref 5–34)
Alkaline Phosphatase: 140 U/L (ref 40–150)
BILIRUBIN TOTAL: 0.5 mg/dL (ref 0.20–1.20)
BUN: 9.5 mg/dL (ref 7.0–26.0)
CHLORIDE: 104 meq/L (ref 98–109)
CO2: 25 meq/L (ref 22–29)
CREATININE: 0.6 mg/dL (ref 0.6–1.1)
Calcium: 8.6 mg/dL (ref 8.4–10.4)
EGFR: >90 mL/min/{1.73_m2} (ref >90)
Glucose: 109 mg/dl (ref 70–140)
Potassium: 4.4 mEq/L (ref 3.5–5.1)
SODIUM: 137 meq/L (ref 136–145)
Total Protein: 5.8 g/dL — ABNORMAL LOW (ref 6.4–8.3)

## 2016-10-23 LAB — CBC WITH DIFFERENTIAL/PLATELET
BASO%: 0.5 % (ref 0.0–2.0)
Basophils Absolute: 0 10*3/uL (ref 0.0–0.1)
EOS ABS: 0.2 10*3/uL (ref 0.0–0.5)
EOS%: 3.3 % (ref 0.0–7.0)
HCT: 33.8 % — ABNORMAL LOW (ref 34.8–46.6)
HEMOGLOBIN: 11.6 g/dL (ref 11.6–15.9)
LYMPH%: 22.9 % (ref 14.0–49.7)
MCH: 30.8 pg (ref 25.1–34.0)
MCHC: 34.3 g/dL (ref 31.5–36.0)
MCV: 89.9 fL (ref 79.5–101.0)
MONO#: 0.9 10*3/uL (ref 0.1–0.9)
MONO%: 14.9 % — AB (ref 0.0–14.0)
NEUT#: 3.7 10*3/uL (ref 1.5–6.5)
NEUT%: 58.4 % (ref 38.4–76.8)
Platelets: 281 10*3/uL (ref 145–400)
RBC: 3.76 10*6/uL (ref 3.70–5.45)
RDW: 19.4 % — AB (ref 11.2–14.5)
WBC: 6.3 10*3/uL (ref 3.9–10.3)
lymph#: 1.4 10*3/uL (ref 0.9–3.3)

## 2016-10-23 MED ORDER — PROCHLORPERAZINE MALEATE 10 MG PO TABS: 10.0000 mg | ORAL_TABLET | Freq: Once | ORAL | Status: DC

## 2016-10-23 MED ORDER — BORTEZOMIB CHEMO SQ INJECTION 3.5 MG (2.5MG/ML)
1.3000 mg/m2 | Freq: Once | INTRAMUSCULAR | Status: AC
  Administered 2016-10-23: 2 mg via SUBCUTANEOUS
  Filled 2016-10-23: qty 2

## 2016-10-23 NOTE — Patient Instructions (Signed)
[facility] Discharge Instructions for Patients Receiving Chemotherapy  Today you received the following chemotherapy agents:  Velcade  To help prevent nausea and vomiting after your treatment, we encourage you to take your nausea medication as prescribed.   If you develop nausea and vomiting that is not controlled by your nausea medication, call the clinic.   BELOW ARE SYMPTOMS THAT SHOULD BE REPORTED IMMEDIATELY:  *FEVER GREATER THAN 100.5 F  *CHILLS WITH OR WITHOUT FEVER  NAUSEA AND VOMITING THAT IS NOT CONTROLLED WITH YOUR NAUSEA MEDICATION  *UNUSUAL SHORTNESS OF BREATH  *UNUSUAL BRUISING OR BLEEDING  TENDERNESS IN MOUTH AND THROAT WITH OR WITHOUT PRESENCE OF ULCERS  *URINARY PROBLEMS  *BOWEL PROBLEMS  UNUSUAL RASH Items with * indicate a potential emergency and should be followed up as soon as possible.  Feel free to call the clinic you have any questions or concerns. The clinic phone number is [phone].  Please show the CHEMO ALERT CARD at check-in to the Emergency Department and triage nurse.   

## 2016-11-06 ENCOUNTER — Other Ambulatory Visit (HOSPITAL_BASED_OUTPATIENT_CLINIC_OR_DEPARTMENT_OTHER): Payer: PPO

## 2016-11-06 ENCOUNTER — Other Ambulatory Visit: Payer: Self-pay | Admitting: Hematology & Oncology

## 2016-11-06 ENCOUNTER — Ambulatory Visit (HOSPITAL_BASED_OUTPATIENT_CLINIC_OR_DEPARTMENT_OTHER): Payer: PPO | Admitting: Hematology & Oncology

## 2016-11-06 ENCOUNTER — Ambulatory Visit (HOSPITAL_BASED_OUTPATIENT_CLINIC_OR_DEPARTMENT_OTHER): Payer: PPO

## 2016-11-06 VITALS — BP 142/53 | HR 74 | Temp 97.8°F | Resp 20 | Wt 104.4 lb

## 2016-11-06 DIAGNOSIS — E782 Mixed hyperlipidemia: Secondary | ICD-10-CM

## 2016-11-06 DIAGNOSIS — Z5112 Encounter for antineoplastic immunotherapy: Secondary | ICD-10-CM

## 2016-11-06 DIAGNOSIS — C9000 Multiple myeloma not having achieved remission: Secondary | ICD-10-CM

## 2016-11-06 LAB — CMP (CANCER CENTER ONLY)
ALBUMIN: 3.3 g/dL (ref 3.3–5.5)
ALK PHOS: 119 U/L — AB (ref 26–84)
ALT: 17 U/L (ref 10–47)
AST: 19 U/L (ref 11–38)
BILIRUBIN TOTAL: 0.5 mg/dL (ref 0.20–1.60)
BUN, Bld: 11 mg/dL (ref 7–22)
CALCIUM: 8.8 mg/dL (ref 8.0–10.3)
CO2: 28 mEq/L (ref 18–33)
Chloride: 102 mEq/L (ref 98–108)
Creat: 0.6 mg/dl (ref 0.6–1.2)
GLUCOSE: 109 mg/dL (ref 73–118)
Potassium: 3.2 mEq/L — ABNORMAL LOW (ref 3.3–4.7)
Sodium: 139 mEq/L (ref 128–145)
Total Protein: 6 g/dL — ABNORMAL LOW (ref 6.4–8.1)

## 2016-11-06 LAB — CBC WITH DIFFERENTIAL (CANCER CENTER ONLY)
BASO#: 0 10*3/uL (ref 0.0–0.2)
BASO%: 0.3 % (ref 0.0–2.0)
EOS ABS: 0.1 10*3/uL (ref 0.0–0.5)
EOS%: 2.2 % (ref 0.0–7.0)
HEMATOCRIT: 35.6 % (ref 34.8–46.6)
HGB: 11.9 g/dL (ref 11.6–15.9)
LYMPH#: 1.3 10*3/uL (ref 0.9–3.3)
LYMPH%: 21.8 % (ref 14.0–48.0)
MCH: 31.1 pg (ref 26.0–34.0)
MCHC: 33.4 g/dL (ref 32.0–36.0)
MCV: 93 fL (ref 81–101)
MONO#: 0.9 10*3/uL (ref 0.1–0.9)
MONO%: 15.1 % — ABNORMAL HIGH (ref 0.0–13.0)
NEUT#: 3.6 10*3/uL (ref 1.5–6.5)
NEUT%: 60.6 % (ref 39.6–80.0)
Platelets: 323 10*3/uL (ref 145–400)
RBC: 3.83 10*6/uL (ref 3.70–5.32)
RDW: 16.4 % — AB (ref 11.1–15.7)
WBC: 5.9 10*3/uL (ref 3.9–10.0)

## 2016-11-06 MED ORDER — DENOSUMAB 120 MG/1.7ML ~~LOC~~ SOLN
120.0000 mg | Freq: Once | SUBCUTANEOUS | Status: AC
  Administered 2016-11-06: 120 mg via SUBCUTANEOUS
  Filled 2016-11-06: qty 1.7

## 2016-11-06 MED ORDER — ATORVASTATIN CALCIUM 20 MG PO TABS: 20.0000 mg | ORAL_TABLET | Freq: Every day | ORAL | 1 refills | Status: DC

## 2016-11-06 MED ORDER — DRONABINOL 2.5 MG PO CAPS: 2.5000 mg | ORAL_CAPSULE | Freq: Two times a day (BID) | ORAL | 0 refills | Status: DC

## 2016-11-06 MED ORDER — BORTEZOMIB CHEMO SQ INJECTION 3.5 MG (2.5MG/ML)
1.3000 mg/m2 | Freq: Once | INTRAMUSCULAR | Status: AC
  Administered 2016-11-06: 2 mg via SUBCUTANEOUS
  Filled 2016-11-06: qty 2

## 2016-11-06 MED ORDER — PROCHLORPERAZINE MALEATE 10 MG PO TABS: 10.0000 mg | ORAL_TABLET | Freq: Four times a day (QID) | ORAL | 3 refills | Status: DC | PRN

## 2016-11-06 NOTE — Progress Notes (Signed)
Hematology and Oncology Follow Up Visit  Debra Ruiz [id number] 1945-08-19 71 y.o. 11/06/2016   Principle Diagnosis:   IgG Kappa myeloma - compression fractures - 13q-, +11, 14q+ - hyperdiploid  Current Therapy:    S/p Kyphoplasty of T11 and T12  Velcade/Revlimid/Decadron -status post cycle #3 - Revlimid stopped on        11/06/2016  XRT to lower spine-completed on 09/25/2016  Xgeva 117m Sq q  3 month     Interim History:  Debra Ruiz back for follow-up. I am absolutely amazed as to how well she has responded to treatment. The last time we checked her myeloma studies, in March, her M spike was down to 0.3 g/dL. Her IgG level was 578 mg/dL. Her Kappa Lightchain was 5.4 mg/L. This is all amazing.  Her back is doing better. My only concern is that her weight is going down. I'm not sure as to why her weight is going down. I told her that she can be more liberal with her diet.  Her back is feeling better. She does not want physical therapy yet.  She has had no nausea or vomiting. She's had no problems with bowels or bladder. She's had no rashes. She's had no leg swelling.  I think that we can Overall, I will see her performance status is ECOG 2.   Medications:  Current Outpatient Prescriptions:  .  aspirin EC 81 MG tablet, Take 81 mg by mouth daily., Disp: , Rfl:  .  ALPRAZolam (XANAX) 0.25 MG tablet, Take 1 tablet (0.25 mg total) by mouth 2 (two) times daily as needed for anxiety., Disp: 60 tablet, Rfl: 0 .  amLODipine (NORVASC) 5 MG tablet, Take 1 tablet (5 mg total) by mouth daily., Disp: 90 tablet, Rfl: 0 .  atorvastatin (LIPITOR) 20 MG tablet, Take 1 tablet (20 mg total) by mouth daily., Disp: 90 tablet, Rfl: 1 .  dexamethasone (DECADRON) 4 MG tablet, Take 5 pills with food once a week, Disp: 60 tablet, Rfl: 3 .  dronabinol (MARINOL) 2.5 MG capsule, Take 1 capsule (2.5 mg total) by mouth 2 (two) times daily before lunch and supper., Disp: 60 capsule, Rfl: 0 .  famciclovir  (FAMVIR) 250 MG tablet, Take 1 tablet (250 mg total) by mouth daily., Disp: 30 tablet, Rfl: 12 .  hydrochlorothiazide (HYDRODIURIL) 25 MG tablet, TAKE 1 TABLET EVERY DAY, Disp: 90 tablet, Rfl: 2 .  nebivolol (BYSTOLIC) 5 MG tablet, Take 1 tablet (5 mg total) by mouth daily., Disp: 30 tablet, Rfl: 2 .  potassium chloride SA (K-DUR,KLOR-CON) 20 MEQ tablet, Take 1 tablet (20 mEq total) by mouth daily., Disp: 90 tablet, Rfl: 1 .  prochlorperazine (COMPAZINE) 10 MG tablet, Take 1 tablet (10 mg total) by mouth every 6 (six) hours as needed for nausea or vomiting., Disp: 60 tablet, Rfl: 3 .  traMADol (ULTRAM) 50 MG tablet, Take 1-2 pills, IF NEEDED, every 6 hrs for pain., Disp: 90 tablet, Rfl: 0 No current facility-administered medications for this visit.   Facility-Administered Medications Ordered in Other Visits:  .  bortezomib SQ (VELCADE) chemo injection 2 mg, 1.3 mg/m2 (Treatment Plan Recorded), Subcutaneous, Once, PVolanda Napoleon MD  Allergies:  Allergies  Allergen Reactions  . Crestor [Rosuvastatin Calcium]     Causes liver functions to be elevated    Past Medical History, Surgical history, Social history, and Family History were reviewed and updated.  Review of Systems: As above  Physical Exam:  weight is 104 lb 6.4 oz (  47.4 kg). Her oral temperature is 97.8 F (36.6 C). Her blood pressure is 142/53 (abnormal) and her pulse is 74. Her respiration is 20 and oxygen saturation is 100%.   Wt Readings from Last 3 Encounters:  11/06/16 104 lb 6.4 oz (47.4 kg)  09/25/16 111 lb 12.8 oz (50.7 kg)  08/20/16 118 lb 11.2 oz (53.8 kg)     Elderly appearing white female. She is in moderate distress secondary to pain. Her headache exam shows no ocular or oral lesions. She has no palpable cervical or supraclavicular lymph nodes. Lungs are clear bilaterally. Cardiac exam regular rate and rhythm with no murmurs, rubs or bruits. Abdomen is soft. She has good bowel sounds. There is no fluid wave.  There is no palpable liver or spleen tip. Back exam shows some slight kyphosis. She has tenderness in the lower thoracic and lumbosacral spine. There may be some slight muscle spasms noted in the paravertebral area. Extremities shows no clubbing, cyanosis or edema. She has decent range of motion of her joints. She has 4/5 strength. Skin exam shows no rashes, ecchymoses or petechia. Neurological exam shows no focal neurological deficits.  Lab Results  Component Value Date   WBC 5.9 11/06/2016   HGB 11.9 11/06/2016   HCT 35.6 11/06/2016   MCV 93 11/06/2016   PLT 323 11/06/2016     Chemistry      Component Value Date/Time   NA 139 11/06/2016 1136   NA 137 10/23/2016 1301   K 3.2 (L) 11/06/2016 1136   K 4.4 10/23/2016 1301   CL 102 11/06/2016 1136   CO2 28 11/06/2016 1136   CO2 25 10/23/2016 1301   BUN 11 11/06/2016 1136   BUN 9.5 10/23/2016 1301   CREATININE 0.6 11/06/2016 1136   CREATININE 0.6 10/23/2016 1301      Component Value Date/Time   CALCIUM 8.8 11/06/2016 1136   CALCIUM 8.6 10/23/2016 1301   ALKPHOS 119 (H) 11/06/2016 1136   ALKPHOS 140 10/23/2016 1301   AST 19 11/06/2016 1136   AST 16 10/23/2016 1301   ALT 17 11/06/2016 1136   ALT 22 10/23/2016 1301   BILITOT 0.50 11/06/2016 1136   BILITOT 0.50 10/23/2016 1301         Impression and Plan: Debra Ruiz is a 71 year old white female with IgG kappa myeloma.  She has responded incredibly well. As such, I think we will try to stop the Revlimid. I want to see how she just does with the Velcade. We can annoys at the Revlimid in the future if necessary.  I will adjust the Xgeva to every 3 month dosing now. I think this would be okay.  We will have to see what her weight is. This is important. Over, she will be gaining some weight.   We will see her back in one month. We will recheck her myeloma studies.   I spent about 30 minutes with she and her husband.   [name], MD 4/18/20181:01 PM

## 2016-11-06 NOTE — Patient Instructions (Signed)
Denosumab injection What is this medicine? DENOSUMAB (den oh sue mab) slows bone breakdown. Prolia is used to treat osteoporosis in women after menopause and in men. Delton See is used to treat a high calcium level due to cancer and to prevent bone fractures and other bone problems caused by multiple myeloma or cancer bone metastases. Delton See is also used to treat giant cell tumor of the bone. This medicine may be used for other purposes; ask your health care provider or pharmacist if you have questions. COMMON BRAND NAME(S): Prolia, XGEVA What should I tell my health care provider before I take this medicine? They need to know if you have any of these conditions: -dental disease -having surgery or tooth extraction -infection -kidney disease -low levels of calcium or Vitamin D in the blood -malnutrition -on hemodialysis -skin conditions or sensitivity -thyroid or parathyroid disease -an unusual reaction to denosumab, other medicines, foods, dyes, or preservatives -pregnant or trying to get pregnant -breast-feeding How should I use this medicine? This medicine is for injection under the skin. It is given by a health care professional in a hospital or clinic setting. If you are getting Prolia, a special MedGuide will be given to you by the pharmacist with each prescription and refill. Be sure to read this information carefully each time. For Prolia, talk to your pediatrician regarding the use of this medicine in children. Special care may be needed. For Delton See, talk to your pediatrician regarding the use of this medicine in children. While this drug may be prescribed for children as young as 13 years for selected conditions, precautions do apply. Overdosage: If you think you have taken too much of this medicine contact a poison control center or emergency room at once. NOTE: This medicine is only for you. Do not share this medicine with others. What if I miss a dose? It is important not to miss your  dose. Call your doctor or health care professional if you are unable to keep an appointment. What may interact with this medicine? Do not take this medicine with any of the following medications: -other medicines containing denosumab This medicine may also interact with the following medications: -medicines that lower your chance of fighting infection -steroid medicines like prednisone or cortisone This list may not describe all possible interactions. Give your health care provider a list of all the medicines, herbs, non-prescription drugs, or dietary supplements you use. Also tell them if you smoke, drink alcohol, or use illegal drugs. Some items may interact with your medicine. What should I watch for while using this medicine? Visit your doctor or health care professional for regular checks on your progress. Your doctor or health care professional may order blood tests and other tests to see how you are doing. Call your doctor or health care professional for advice if you get a fever, chills or sore throat, or other symptoms of a cold or flu. Do not treat yourself. This drug may decrease your body's ability to fight infection. Try to avoid being around people who are sick. You should make sure you get enough calcium and vitamin D while you are taking this medicine, unless your doctor tells you not to. Discuss the foods you eat and the vitamins you take with your health care professional. See your dentist regularly. Brush and floss your teeth as directed. Before you have any dental work done, tell your dentist you are receiving this medicine. Do not become pregnant while taking this medicine or for 5 months after stopping  it. Talk with your doctor or health care professional about your birth control options while taking this medicine. Women should inform their doctor if they wish to become pregnant or think they might be pregnant. There is a potential for serious side effects to an unborn child. Talk  to your health care professional or pharmacist for more information. What side effects may I notice from receiving this medicine? Side effects that you should report to your doctor or health care professional as soon as possible: -allergic reactions like skin rash, itching or hives, swelling of the face, lips, or tongue -bone pain -breathing problems -dizziness -jaw pain, especially after dental work -redness, blistering, peeling of the skin -signs and symptoms of infection like fever or chills; cough; sore throat; pain or trouble passing urine -signs of low calcium like fast heartbeat, muscle cramps or muscle pain; pain, tingling, numbness in the hands or feet; seizures -unusual bleeding or bruising -unusually weak or tired Side effects that usually do not require medical attention (report to your doctor or health care professional if they continue or are bothersome): -constipation -diarrhea -headache -joint pain -loss of appetite -muscle pain -runny nose -tiredness -upset stomach This list may not describe all possible side effects. Call your doctor for medical advice about side effects. You may report side effects to FDA at 1-800-FDA-1088. Where should I keep my medicine? This medicine is only given in a clinic, doctor's office, or other health care setting and will not be stored at home. NOTE: This sheet is a summary. It may not cover all possible information. If you have questions about this medicine, talk to your doctor, pharmacist, or health care provider.  2018 Elsevier/Gold Standard (2016-07-30 19:17:21) Bortezomib injection What is this medicine? BORTEZOMIB (bor TEZ oh mib) is a medicine that targets proteins in cancer cells and stops the cancer cells from growing. It is used to treat multiple myeloma and mantle-cell lymphoma. This medicine may be used for other purposes; ask your health care provider or pharmacist if you have questions. COMMON BRAND NAME(S): Velcade What  should I tell my health care provider before I take this medicine? They need to know if you have any of these conditions: -diabetes -heart disease -irregular heartbeat -liver disease -on hemodialysis -low blood counts, like low white blood cells, platelets, or hemoglobin -peripheral neuropathy -taking medicine for blood pressure -an unusual or allergic reaction to bortezomib, mannitol, boron, other medicines, foods, dyes, or preservatives -pregnant or trying to get pregnant -breast-feeding How should I use this medicine? This medicine is for injection into a vein or for injection under the skin. It is given by a health care professional in a hospital or clinic setting. Talk to your pediatrician regarding the use of this medicine in children. Special care may be needed. Overdosage: If you think you have taken too much of this medicine contact a poison control center or emergency room at once. NOTE: This medicine is only for you. Do not share this medicine with others. What if I miss a dose? It is important not to miss your dose. Call your doctor or health care professional if you are unable to keep an appointment. What may interact with this medicine? This medicine may interact with the following medications: -ketoconazole -rifampin -ritonavir -St. John's Wort This list may not describe all possible interactions. Give your health care provider a list of all the medicines, herbs, non-prescription drugs, or dietary supplements you use. Also tell them if you smoke, drink alcohol, or use illegal  drugs. Some items may interact with your medicine. What should I watch for while using this medicine? You may get drowsy or dizzy. Do not drive, use machinery, or do anything that needs mental alertness until you know how this medicine affects you. Do not stand or sit up quickly, especially if you are an older patient. This reduces the risk of dizzy or fainting spells. In some cases, you may be given  additional medicines to help with side effects. Follow all directions for their use. Call your doctor or health care professional for advice if you get a fever, chills or sore throat, or other symptoms of a cold or flu. Do not treat yourself. This drug decreases your body's ability to fight infections. Try to avoid being around people who are sick. This medicine may increase your risk to bruise or bleed. Call your doctor or health care professional if you notice any unusual bleeding. You may need blood work done while you are taking this medicine. In some patients, this medicine may cause a serious brain infection that may cause death. If you have any problems seeing, thinking, speaking, walking, or standing, tell your doctor right away. If you cannot reach your doctor, urgently seek other source of medical care. Check with your doctor or health care professional if you get an attack of severe diarrhea, nausea and vomiting, or if you sweat a lot. The loss of too much body fluid can make it dangerous for you to take this medicine. Do not become pregnant while taking this medicine or for at least 2 months after stopping it. Women should inform their doctor if they wish to become pregnant or think they might be pregnant. Men should not father a child while taking this medicine and for at least 2 months after stopping it. There is a potential for serious side effects to an unborn child. Talk to your health care professional or pharmacist for more information. Do not breast-feed an infant while taking this medicine or for 2 months after stopping it. This medicine may interfere with the ability to have a child. You should talk with your doctor or health care professional if you are concerned about your fertility. What side effects may I notice from receiving this medicine? Side effects that you should report to your doctor or health care professional as soon as possible: -allergic reactions like skin rash,  itching or hives, swelling of the face, lips, or tongue -breathing problems -changes in hearing -changes in vision -fast, irregular heartbeat -feeling faint or lightheaded, falls -pain, tingling, numbness in the hands or feet -right upper belly pain -seizures -swelling of the ankles, feet, hands -unusual bleeding or bruising -unusually weak or tired -vomiting -yellowing of the eyes or skin Side effects that usually do not require medical attention (report to your doctor or health care professional if they continue or are bothersome): -changes in emotions or moods -constipation -diarrhea -loss of appetite -headache -irritation at site where injected -nausea This list may not describe all possible side effects. Call your doctor for medical advice about side effects. You may report side effects to FDA at 1-800-FDA-1088. Where should I keep my medicine? This drug is given in a hospital or clinic and will not be stored at home. NOTE: This sheet is a summary. It may not cover all possible information. If you have questions about this medicine, talk to your doctor, pharmacist, or health care provider.  2018 Elsevier/Gold Standard (2016-06-06 15:53:51)  

## 2016-11-12 ENCOUNTER — Encounter: Payer: Self-pay | Admitting: Oncology

## 2016-11-13 ENCOUNTER — Ambulatory Visit
Admission: RE | Admit: 2016-11-13 | Discharge: 2016-11-13 | Disposition: A | Discharge status: Home / Self Care | Payer: PPO | Source: Ambulatory Visit | Attending: Radiation Oncology | Admitting: Radiation Oncology

## 2016-11-13 ENCOUNTER — Encounter: Payer: Self-pay | Admitting: Radiation Oncology

## 2016-11-13 ENCOUNTER — Other Ambulatory Visit (HOSPITAL_BASED_OUTPATIENT_CLINIC_OR_DEPARTMENT_OTHER): Payer: PPO

## 2016-11-13 ENCOUNTER — Ambulatory Visit (HOSPITAL_BASED_OUTPATIENT_CLINIC_OR_DEPARTMENT_OTHER): Payer: PPO

## 2016-11-13 VITALS — BP 169/93 | HR 66 | Temp 99.5°F | Resp 16

## 2016-11-13 VITALS — BP 131/57 | HR 71 | Temp 97.9°F | Ht 61.0 in | Wt 106.8 lb

## 2016-11-13 DIAGNOSIS — C9000 Multiple myeloma not having achieved remission: Secondary | ICD-10-CM | POA: Diagnosis not present

## 2016-11-13 DIAGNOSIS — Z5112 Encounter for antineoplastic immunotherapy: Secondary | ICD-10-CM | POA: Diagnosis not present

## 2016-11-13 LAB — COMPREHENSIVE METABOLIC PANEL
ALK PHOS: 119 U/L (ref 40–150)
ALT: 15 U/L (ref 0–55)
ANION GAP: 9 meq/L (ref 3–11)
AST: 13 U/L (ref 5–34)
Albumin: 3.7 g/dL (ref 3.5–5.0)
BUN: 14.6 mg/dL (ref 7.0–26.0)
CALCIUM: 9.3 mg/dL (ref 8.4–10.4)
CO2: 29 mEq/L (ref 22–29)
Chloride: 100 mEq/L (ref 98–109)
Creatinine: 0.6 mg/dL (ref 0.6–1.1)
EGFR: >90 mL/min/{1.73_m2} (ref >90)
Glucose: 86 mg/dl (ref 70–140)
Potassium: 3.8 mEq/L (ref 3.5–5.1)
Sodium: 138 mEq/L (ref 136–145)
TOTAL PROTEIN: 5.9 g/dL — AB (ref 6.4–8.3)
Total Bilirubin: 0.48 mg/dL (ref 0.20–1.20)

## 2016-11-13 LAB — CBC WITH DIFFERENTIAL/PLATELET
BASO%: 0.2 % (ref 0.0–2.0)
Basophils Absolute: 0 10*3/uL (ref 0.0–0.1)
EOS ABS: 0.4 10*3/uL (ref 0.0–0.5)
EOS%: 3.4 % (ref 0.0–7.0)
HEMATOCRIT: 35.3 % (ref 34.8–46.6)
HGB: 11.8 g/dL (ref 11.6–15.9)
LYMPH#: 1.1 10*3/uL (ref 0.9–3.3)
LYMPH%: 9.3 % — ABNORMAL LOW (ref 14.0–49.7)
MCH: 30.6 pg (ref 25.1–34.0)
MCHC: 33.4 g/dL (ref 31.5–36.0)
MCV: 91.7 fL (ref 79.5–101.0)
MONO#: 1.8 10*3/uL — AB (ref 0.1–0.9)
MONO%: 15.5 % — ABNORMAL HIGH (ref 0.0–14.0)
NEUT%: 71.6 % (ref 38.4–76.8)
NEUTROS ABS: 8.3 10*3/uL — AB (ref 1.5–6.5)
PLATELETS: 236 10*3/uL (ref 145–400)
RBC: 3.85 10*6/uL (ref 3.70–5.45)
RDW: 17.8 % — ABNORMAL HIGH (ref 11.2–14.5)
WBC: 11.6 10*3/uL — ABNORMAL HIGH (ref 3.9–10.3)

## 2016-11-13 LAB — LACTATE DEHYDROGENASE: LDH: 172 U/L (ref 125–245)

## 2016-11-13 MED ORDER — BORTEZOMIB CHEMO SQ INJECTION 3.5 MG (2.5MG/ML)
1.3000 mg/m2 | Freq: Once | INTRAMUSCULAR | Status: AC
  Administered 2016-11-13: 2 mg via SUBCUTANEOUS
  Filled 2016-11-13: qty 2

## 2016-11-13 MED ORDER — PROCHLORPERAZINE MALEATE 10 MG PO TABS
ORAL_TABLET | ORAL | Status: AC
  Filled 2016-11-13: qty 1

## 2016-11-13 MED ORDER — PROCHLORPERAZINE MALEATE 10 MG PO TABS: 10.0000 mg | ORAL_TABLET | Freq: Once | ORAL | Status: DC

## 2016-11-13 NOTE — Progress Notes (Signed)
Debra Ruiz is here for follow up.  She reports the pain in her middle/lower back is much better.  She continues to take tramadol twice a day as needed.  She is able to ambulate with a walker.  She denies having fatigue.  BP (!) 150/56 (BP Location: Right Arm, Patient Position: Sitting)   Pulse 66   Temp 97.9 F (36.6 C) (Oral)   Ht 5\' 1"  (1.549 m)   Wt 106 lb 12.8 oz (48.4 kg)   SpO2 100%   BMI 20.18 kg/m    Wt Readings from Last 3 Encounters:  11/13/16 106 lb 12.8 oz (48.4 kg)  11/06/16 104 lb 6.4 oz (47.4 kg)  09/25/16 111 lb 12.8 oz (50.7 kg)

## 2016-11-13 NOTE — Progress Notes (Signed)
Radiation Oncology         [phone] ________________________________  Name: Debra Ruiz MRN: [id number]  Date: 11/13/2016  DOB: [birth year]  Follow-Up Visit Note  CC: Elby Showers, MD  [name], MD   Diagnosis:  IgG Kappa myeloma - compression fractures - 13q-, +11, 14q+ - hyperdiploid  Interval Since Last Radiation:  6 weeks  09/04/16-09/25/16: 30 Gy to the spine T11-S3 in 15 fractions  Narrative:  The patient returns today for routine follow-up. She reports pain in the middle/lower back has improved. She continues to take Tramadol twice a day as needed. She is able to ambulate with a walker. She denies fatigue.                            ALLERGIES:  is allergic to crestor [rosuvastatin calcium].  Meds: Current Outpatient Prescriptions  Medication Sig Dispense Refill  . ALPRAZolam (XANAX) 0.25 MG tablet Take 1 tablet (0.25 mg total) by mouth 2 (two) times daily as needed for anxiety. 60 tablet 0  . amLODipine (NORVASC) 5 MG tablet Take 1 tablet (5 mg total) by mouth daily. 90 tablet 0  . aspirin EC 81 MG tablet Take 81 mg by mouth daily.    Marland Kitchen atorvastatin (LIPITOR) 20 MG tablet Take 1 tablet (20 mg total) by mouth daily. 90 tablet 1  . dexamethasone (DECADRON) 4 MG tablet Take 5 pills with food once a week 60 tablet 3  . dronabinol (MARINOL) 2.5 MG capsule Take 1 capsule (2.5 mg total) by mouth 2 (two) times daily before lunch and supper. 60 capsule 0  . famciclovir (FAMVIR) 250 MG tablet Take 1 tablet (250 mg total) by mouth daily. 30 tablet 12  . hydrochlorothiazide (HYDRODIURIL) 25 MG tablet TAKE 1 TABLET EVERY DAY 90 tablet 2  . nebivolol (BYSTOLIC) 5 MG tablet Take 1 tablet (5 mg total) by mouth daily. 30 tablet 2  . potassium chloride SA (K-DUR,KLOR-CON) 20 MEQ tablet Take 1 tablet (20 mEq total) by mouth daily. 90 tablet 1  . prochlorperazine (COMPAZINE) 10 MG tablet TAKE 1 TABLET(10 MG) BY MOUTH EVERY 6 HOURS AS NEEDED FOR NAUSEA OR VOMITING 360 tablet 3  .  REVLIMID 25 MG capsule     . traMADol (ULTRAM) 50 MG tablet Take 1-2 pills, IF NEEDED, every 6 hrs for pain. 90 tablet 0   No current facility-administered medications for this encounter.     Physical Findings: The patient is in no acute distress. Patient is alert and oriented.  height is '5\' 1"'  (1.549 m) and weight is 106 lb 12.8 oz (48.4 kg). Her oral temperature is 97.9 F (36.6 C). Her blood pressure is 131/57 (abnormal) and her pulse is 71. Her oxygen saturation is 100%. .  No significant changes. Lungs are clear to auscultation bilaterally. Heart has regular rate and rhythm. No palpable cervical, supraclavicular, or axillary adenopathy. Abdomen soft, non-tender, normal bowel sounds.   Lab Findings: Lab Results  Component Value Date   WBC 5.9 11/06/2016   HGB 11.9 11/06/2016   HCT 35.6 11/06/2016   MCV 93 11/06/2016   PLT 323 11/06/2016    Radiographic Findings: No results found.  Impression:  Multiple myeloma. The patient is recovering from the effects of radiation. The patient seems to have improvement of her back pain from her palliative radiation therapy.  Plan:  The patient will follow-up in radiation oncology prn. She will remain under close follow-up with  Dr. Marin Olp.  ____________________________________  This document serves as a record of services personally performed by Gery Pray, MD. It was created on his behalf by Bethann Humble, a trained medical scribe. The creation of this record is based on the scribe's personal observations and the provider's statements to them. This document has been checked and approved by the attending provider.

## 2016-11-13 NOTE — Patient Instructions (Signed)
[facility] Discharge Instructions for Patients Receiving Chemotherapy  Today you received the following chemotherapy agents Velcade. To help prevent nausea and vomiting after your treatment, we encourage you to take your nausea medication as directed.  If you develop nausea and vomiting that is not controlled by your nausea medication, call the clinic.   BELOW ARE SYMPTOMS THAT SHOULD BE REPORTED IMMEDIATELY:  *FEVER GREATER THAN 100.5 F  *CHILLS WITH OR WITHOUT FEVER  NAUSEA AND VOMITING THAT IS NOT CONTROLLED WITH YOUR NAUSEA MEDICATION  *UNUSUAL SHORTNESS OF BREATH  *UNUSUAL BRUISING OR BLEEDING  TENDERNESS IN MOUTH AND THROAT WITH OR WITHOUT PRESENCE OF ULCERS  *URINARY PROBLEMS  *BOWEL PROBLEMS  UNUSUAL RASH Items with * indicate a potential emergency and should be followed up as soon as possible.  Feel free to call the clinic you have any questions or concerns. The clinic phone number is [phone].  Please show the CHEMO ALERT CARD at check-in to the Emergency Department and triage nurse.    

## 2016-11-14 ENCOUNTER — Ambulatory Visit: Payer: Self-pay | Admitting: Radiation Oncology

## 2016-11-14 LAB — BETA 2 MICROGLOBULIN, SERUM: BETA 2: 1.6 mg/L (ref 0.6–2.4)

## 2016-11-14 LAB — IGG, IGA, IGM
IgA, Qn, Serum: 12 mg/dL — ABNORMAL LOW (ref 87–352)
IgG, Qn, Serum: 431 mg/dL — ABNORMAL LOW (ref 700–1600)
IgM, Qn, Serum: 35 mg/dL (ref 26–217)

## 2016-11-14 LAB — KAPPA/LAMBDA LIGHT CHAINS
Ig Kappa Free Light Chain: 5.4 mg/L (ref 3.3–19.4)
Ig Lambda Free Light Chain: 5.6 mg/L — ABNORMAL LOW (ref 5.7–26.3)
KAPPA/LAMBDA FLC RATIO: 0.96 (ref 0.26–1.65)

## 2016-11-18 LAB — PROTEIN ELECTROPHORESIS, SERUM, WITH REFLEX
A/G RATIO SPE: 1.6 (ref 0.7–1.7)
ALPHA 2: 0.7 g/dL (ref 0.4–1.0)
Albumin: 3.6 g/dL (ref 2.9–4.4)
Alpha 1: 0.2 g/dL (ref 0.0–0.4)
BETA: 0.9 g/dL (ref 0.7–1.3)
GAMMA GLOBULIN: 0.4 g/dL (ref 0.4–1.8)
GLOBULIN, TOTAL: 2.2 g/dL (ref 2.2–3.9)
Interpretation(See Below): 0
M-SPIKE, %: 0.2 g/dL — AB
TOTAL PROTEIN: 5.8 g/dL — AB (ref 6.0–8.5)

## 2016-11-20 ENCOUNTER — Other Ambulatory Visit (HOSPITAL_BASED_OUTPATIENT_CLINIC_OR_DEPARTMENT_OTHER): Payer: PPO

## 2016-11-20 ENCOUNTER — Ambulatory Visit (HOSPITAL_BASED_OUTPATIENT_CLINIC_OR_DEPARTMENT_OTHER): Payer: PPO

## 2016-11-20 VITALS — BP 126/56 | HR 78 | Temp 98.3°F | Resp 17

## 2016-11-20 DIAGNOSIS — C9000 Multiple myeloma not having achieved remission: Secondary | ICD-10-CM

## 2016-11-20 DIAGNOSIS — Z5112 Encounter for antineoplastic immunotherapy: Secondary | ICD-10-CM | POA: Diagnosis not present

## 2016-11-20 LAB — CBC WITH DIFFERENTIAL/PLATELET
BASO%: 0.2 % (ref 0.0–2.0)
Basophils Absolute: 0 10*3/uL (ref 0.0–0.1)
EOS ABS: 0.5 10*3/uL (ref 0.0–0.5)
EOS%: 5.3 % (ref 0.0–7.0)
HCT: 36.3 % (ref 34.8–46.6)
HGB: 12.5 g/dL (ref 11.6–15.9)
LYMPH%: 14.5 % (ref 14.0–49.7)
MCH: 31.4 pg (ref 25.1–34.0)
MCHC: 34.3 g/dL (ref 31.5–36.0)
MCV: 91.6 fL (ref 79.5–101.0)
MONO#: 1.2 10*3/uL — AB (ref 0.1–0.9)
MONO%: 13.6 % (ref 0.0–14.0)
NEUT%: 66.4 % (ref 38.4–76.8)
NEUTROS ABS: 5.7 10*3/uL (ref 1.5–6.5)
Platelets: 238 10*3/uL (ref 145–400)
RBC: 3.97 10*6/uL (ref 3.70–5.45)
RDW: 17.3 % — ABNORMAL HIGH (ref 11.2–14.5)
WBC: 8.6 10*3/uL (ref 3.9–10.3)
lymph#: 1.3 10*3/uL (ref 0.9–3.3)

## 2016-11-20 LAB — COMPREHENSIVE METABOLIC PANEL
ALT: 29 U/L (ref 0–55)
ANION GAP: 12 meq/L — AB (ref 3–11)
AST: 15 U/L (ref 5–34)
Albumin: 3.8 g/dL (ref 3.5–5.0)
Alkaline Phosphatase: 113 U/L (ref 40–150)
BUN: 12.1 mg/dL (ref 7.0–26.0)
CHLORIDE: 103 meq/L (ref 98–109)
CO2: 27 meq/L (ref 22–29)
Calcium: 9.4 mg/dL (ref 8.4–10.4)
Creatinine: 0.6 mg/dL (ref 0.6–1.1)
GLUCOSE: 125 mg/dL (ref 70–140)
Potassium: 3.4 mEq/L — ABNORMAL LOW (ref 3.5–5.1)
SODIUM: 142 meq/L (ref 136–145)
TOTAL PROTEIN: 5.9 g/dL — AB (ref 6.4–8.3)
Total Bilirubin: 0.45 mg/dL (ref 0.20–1.20)

## 2016-11-20 MED ORDER — BORTEZOMIB CHEMO SQ INJECTION 3.5 MG (2.5MG/ML)
1.3000 mg/m2 | Freq: Once | INTRAMUSCULAR | Status: AC
  Administered 2016-11-20: 2 mg via SUBCUTANEOUS
  Filled 2016-11-20: qty 2

## 2016-11-20 MED ORDER — PROCHLORPERAZINE MALEATE 10 MG PO TABS: 10.0000 mg | ORAL_TABLET | Freq: Once | ORAL | Status: DC

## 2016-11-20 NOTE — Patient Instructions (Signed)
[facility] Discharge Instructions for Patients Receiving Chemotherapy  Today you received the following chemotherapy agents Velcade. To help prevent nausea and vomiting after your treatment, we encourage you to take your nausea medication as directed.  If you develop nausea and vomiting that is not controlled by your nausea medication, call the clinic.   BELOW ARE SYMPTOMS THAT SHOULD BE REPORTED IMMEDIATELY:  *FEVER GREATER THAN 100.5 F  *CHILLS WITH OR WITHOUT FEVER  NAUSEA AND VOMITING THAT IS NOT CONTROLLED WITH YOUR NAUSEA MEDICATION  *UNUSUAL SHORTNESS OF BREATH  *UNUSUAL BRUISING OR BLEEDING  TENDERNESS IN MOUTH AND THROAT WITH OR WITHOUT PRESENCE OF ULCERS  *URINARY PROBLEMS  *BOWEL PROBLEMS  UNUSUAL RASH Items with * indicate a potential emergency and should be followed up as soon as possible.  Feel free to call the clinic you have any questions or concerns. The clinic phone number is [phone].  Please show the CHEMO ALERT CARD at check-in to the Emergency Department and triage nurse.    

## 2016-11-27 ENCOUNTER — Other Ambulatory Visit: Payer: Self-pay | Admitting: *Deleted

## 2016-11-27 DIAGNOSIS — C9000 Multiple myeloma not having achieved remission: Secondary | ICD-10-CM

## 2016-11-27 MED ORDER — REVLIMID 25 MG PO CAPS: 25.0000 mg | ORAL_CAPSULE | Freq: Every day | ORAL | 0 refills | Status: AC

## 2016-11-27 MED ORDER — TRAMADOL HCL 50 MG PO TABS: ORAL_TABLET | ORAL | 0 refills | Status: DC

## 2016-12-03 ENCOUNTER — Ambulatory Visit (HOSPITAL_BASED_OUTPATIENT_CLINIC_OR_DEPARTMENT_OTHER): Payer: PPO

## 2016-12-03 ENCOUNTER — Other Ambulatory Visit (HOSPITAL_BASED_OUTPATIENT_CLINIC_OR_DEPARTMENT_OTHER): Payer: PPO

## 2016-12-03 ENCOUNTER — Ambulatory Visit (HOSPITAL_BASED_OUTPATIENT_CLINIC_OR_DEPARTMENT_OTHER): Payer: PPO | Admitting: Hematology & Oncology

## 2016-12-03 VITALS — BP 132/63 | HR 86 | Temp 98.1°F | Resp 20 | Wt 105.8 lb

## 2016-12-03 DIAGNOSIS — Z5112 Encounter for antineoplastic immunotherapy: Secondary | ICD-10-CM | POA: Diagnosis not present

## 2016-12-03 DIAGNOSIS — C9000 Multiple myeloma not having achieved remission: Secondary | ICD-10-CM

## 2016-12-03 LAB — CBC WITH DIFFERENTIAL (CANCER CENTER ONLY)
BASO#: 0 10*3/uL (ref 0.0–0.2)
BASO%: 0.1 % (ref 0.0–2.0)
EOS ABS: 0 10*3/uL (ref 0.0–0.5)
EOS%: 0.1 % (ref 0.0–7.0)
HEMATOCRIT: 38.6 % (ref 34.8–46.6)
HEMOGLOBIN: 13.2 g/dL (ref 11.6–15.9)
LYMPH#: 1 10*3/uL (ref 0.9–3.3)
LYMPH%: 6.8 % — ABNORMAL LOW (ref 14.0–48.0)
MCH: 31.9 pg (ref 26.0–34.0)
MCHC: 34.2 g/dL (ref 32.0–36.0)
MCV: 93 fL (ref 81–101)
MONO#: 2.2 10*3/uL — AB (ref 0.1–0.9)
MONO%: 15.4 % — ABNORMAL HIGH (ref 0.0–13.0)
NEUT%: 77.6 % (ref 39.6–80.0)
NEUTROS ABS: 11 10*3/uL — AB (ref 1.5–6.5)
Platelets: 329 10*3/uL (ref 145–400)
RBC: 4.14 10*6/uL (ref 3.70–5.32)
RDW: 14.9 % (ref 11.1–15.7)
WBC: 14.2 10*3/uL — ABNORMAL HIGH (ref 3.9–10.0)

## 2016-12-03 LAB — CMP (CANCER CENTER ONLY)
ALBUMIN: 3.6 g/dL (ref 3.3–5.5)
ALT(SGPT): 25 U/L (ref 10–47)
AST: 19 U/L (ref 11–38)
Alkaline Phosphatase: 95 U/L — ABNORMAL HIGH (ref 26–84)
BILIRUBIN TOTAL: 0.6 mg/dL (ref 0.20–1.60)
BUN, Bld: 14 mg/dL (ref 7–22)
CO2: 27 meq/L (ref 18–33)
Calcium: 9.5 mg/dL (ref 8.0–10.3)
Chloride: 101 mEq/L (ref 98–108)
Creat: 0.8 mg/dl (ref 0.6–1.2)
Glucose, Bld: 114 mg/dL (ref 73–118)
Potassium: 3.6 mEq/L (ref 3.3–4.7)
Sodium: 137 mEq/L (ref 128–145)
TOTAL PROTEIN: 6.3 g/dL — AB (ref 6.4–8.1)

## 2016-12-03 MED ORDER — PROCHLORPERAZINE MALEATE 10 MG PO TABS: 10.0000 mg | ORAL_TABLET | Freq: Once | ORAL | Status: DC

## 2016-12-03 MED ORDER — BORTEZOMIB CHEMO SQ INJECTION 3.5 MG (2.5MG/ML)
1.3000 mg/m2 | Freq: Once | INTRAMUSCULAR | Status: AC
  Administered 2016-12-03: 2 mg via SUBCUTANEOUS
  Filled 2016-12-03: qty 2

## 2016-12-03 NOTE — Patient Instructions (Signed)

## 2016-12-03 NOTE — Progress Notes (Signed)
Hematology and Oncology Follow Up Visit  Debra Ruiz [id number] 14-Oct-1945 71 y.o. 12/03/2016   Principle Diagnosis:   IgG Kappa myeloma - compression fractures - 13q-, +11, 14q+ - hyperdiploid  Current Therapy:    S/p Kyphoplasty of T11 and T12  Velcade/Revlimid/Decadron -status post cycle #3 - Revlimid stopped on        11/06/2016  XRT to lower spine-completed on 09/25/2016  Xgeva '120mg'$  Sq q  3 month - next dose in July 2018     Interim History:  Debra Ruiz is back for follow-up. She continues to do quite well. She is not using the walker as much. Her back is not hurting as much as. I think the kyphoplasty and radiation really has done nicely for her.  Her myeloma studies have looked incredibly well. Her M spike is now down to 0.2 g/dL. Her IgG level is now 4-31 mg/dL. Her Kappa Light chain is now down to 0.5 mg/dL.  Her appetite is still not all that good. We're trying different things to try to help with her appetite. She's not throwing up. She's having no diarrhea. She's having no constipation.  She's had no rashes. She's had no fever. She's had no bleeding.   Overall,  her performance status is ECOG 2.   Medications:  Current Outpatient Prescriptions:  .  ALPRAZolam (XANAX) 0.25 MG tablet, Take 1 tablet (0.25 mg total) by mouth 2 (two) times daily as needed for anxiety., Disp: 60 tablet, Rfl: 0 .  amLODipine (NORVASC) 5 MG tablet, Take 1 tablet (5 mg total) by mouth daily., Disp: 90 tablet, Rfl: 0 .  aspirin EC 81 MG tablet, Take 81 mg by mouth daily., Disp: , Rfl:  .  atorvastatin (LIPITOR) 20 MG tablet, Take 1 tablet (20 mg total) by mouth daily., Disp: 90 tablet, Rfl: 1 .  dexamethasone (DECADRON) 4 MG tablet, Take 5 pills with food once a week, Disp: 60 tablet, Rfl: 3 .  dronabinol (MARINOL) 2.5 MG capsule, Take 1 capsule (2.5 mg total) by mouth 2 (two) times daily before lunch and supper., Disp: 60 capsule, Rfl: 0 .  famciclovir (FAMVIR) 250 MG tablet, Take 1  tablet (250 mg total) by mouth daily., Disp: 30 tablet, Rfl: 12 .  hydrochlorothiazide (HYDRODIURIL) 25 MG tablet, TAKE 1 TABLET EVERY DAY, Disp: 90 tablet, Rfl: 2 .  nebivolol (BYSTOLIC) 5 MG tablet, Take 1 tablet (5 mg total) by mouth daily., Disp: 30 tablet, Rfl: 2 .  potassium chloride SA (K-DUR,KLOR-CON) 20 MEQ tablet, Take 1 tablet (20 mEq total) by mouth daily., Disp: 90 tablet, Rfl: 1 .  prochlorperazine (COMPAZINE) 10 MG tablet, TAKE 1 TABLET(10 MG) BY MOUTH EVERY 6 HOURS AS NEEDED FOR NAUSEA OR VOMITING, Disp: 360 tablet, Rfl: 3 .  REVLIMID 25 MG capsule, Take 1 capsule (25 mg total) by mouth daily. Authorization number [id number], Disp: 21 capsule, Rfl: 0 .  traMADol (ULTRAM) 50 MG tablet, Take 1-2 pills, IF NEEDED, every 6 hrs for pain., Disp: 90 tablet, Rfl: 0  Allergies:  Allergies  Allergen Reactions  . Crestor [Rosuvastatin Calcium]     Causes liver functions to be elevated    Past Medical History, Surgical history, Social history, and Family History were reviewed and updated.  Review of Systems: As above  Physical Exam:  weight is 105 lb 12.8 oz (48 kg). Her oral temperature is 98.1 F (36.7 C). Her blood pressure is 132/63 and her pulse is 86. Her respiration is 20 and oxygen saturation  is 100%.   Wt Readings from Last 3 Encounters:  12/03/16 105 lb 12.8 oz (48 kg)  11/13/16 106 lb 12.8 oz (48.4 kg)  11/06/16 104 lb 6.4 oz (47.4 kg)     Elderly appearing white female. She is in moderate distress secondary to pain. Her headache exam shows no ocular or oral lesions. She has no palpable cervical or supraclavicular lymph nodes. Lungs are clear bilaterally. Cardiac exam regular rate and rhythm with no murmurs, rubs or bruits. Abdomen is soft. She has good bowel sounds. There is no fluid wave. There is no palpable liver or spleen tip. Back exam shows some slight kyphosis. She has tenderness in the lower thoracic and lumbosacral spine. There may be some slight muscle spasms  noted in the paravertebral area. Extremities shows no clubbing, cyanosis or edema. She has decent range of motion of her joints. She has 4/5 strength. Skin exam shows no rashes, ecchymoses or petechia. Neurological exam shows no focal neurological deficits.  Lab Results  Component Value Date   WBC 14.2 (H) 12/03/2016   HGB 13.2 12/03/2016   HCT 38.6 12/03/2016   MCV 93 12/03/2016   PLT 329 12/03/2016     Chemistry      Component Value Date/Time   NA 137 12/03/2016 1145   NA 142 11/20/2016 1107   K 3.6 12/03/2016 1145   K 3.4 (L) 11/20/2016 1107   CL 101 12/03/2016 1145   CO2 27 12/03/2016 1145   CO2 27 11/20/2016 1107   BUN 14 12/03/2016 1145   BUN 12.1 11/20/2016 1107   CREATININE 0.8 12/03/2016 1145   CREATININE 0.6 11/20/2016 1107      Component Value Date/Time   CALCIUM 9.5 12/03/2016 1145   CALCIUM 9.4 11/20/2016 1107   ALKPHOS 95 (H) 12/03/2016 1145   ALKPHOS 113 11/20/2016 1107   AST 19 12/03/2016 1145   AST 15 11/20/2016 1107   ALT 25 12/03/2016 1145   ALT 29 11/20/2016 1107   BILITOT 0.60 12/03/2016 1145   BILITOT 0.45 11/20/2016 1107         Impression and Plan: Debra Ruiz is a 71 year old white female with IgG kappa myeloma.  She has responded incredibly well. As such, I think we will try to stop the Revlimid. I want to see how she just does with the Velcade. We can annoys at the Revlimid in the future if necessary.  I will adjust the Xgeva to every 3 month dosing now. I think this would be okay.  We will see her back in one month. We will recheck her myeloma studies.   I spent about 30 minutes with she and her husband.   [name], MD 5/15/20181:11 PM

## 2016-12-11 ENCOUNTER — Other Ambulatory Visit (HOSPITAL_BASED_OUTPATIENT_CLINIC_OR_DEPARTMENT_OTHER): Payer: PPO

## 2016-12-11 ENCOUNTER — Ambulatory Visit (HOSPITAL_BASED_OUTPATIENT_CLINIC_OR_DEPARTMENT_OTHER): Payer: PPO

## 2016-12-11 VITALS — BP 121/82 | HR 82 | Temp 97.8°F | Resp 18

## 2016-12-11 DIAGNOSIS — Z5112 Encounter for antineoplastic immunotherapy: Secondary | ICD-10-CM | POA: Diagnosis not present

## 2016-12-11 DIAGNOSIS — C9000 Multiple myeloma not having achieved remission: Secondary | ICD-10-CM

## 2016-12-11 LAB — COMPREHENSIVE METABOLIC PANEL
ALBUMIN: 3.7 g/dL (ref 3.5–5.0)
ALT: 15 U/L (ref 0–55)
ANION GAP: 10 meq/L (ref 3–11)
AST: 12 U/L (ref 5–34)
Alkaline Phosphatase: 108 U/L (ref 40–150)
BILIRUBIN TOTAL: 0.36 mg/dL (ref 0.20–1.20)
BUN: 18.5 mg/dL (ref 7.0–26.0)
CALCIUM: 9.8 mg/dL (ref 8.4–10.4)
CO2: 28 mEq/L (ref 22–29)
Chloride: 104 mEq/L (ref 98–109)
Creatinine: 0.7 mg/dL (ref 0.6–1.1)
EGFR: 90 mL/min/{1.73_m2} — AB (ref >90)
GLUCOSE: 88 mg/dL (ref 70–140)
Potassium: 3.7 mEq/L (ref 3.5–5.1)
SODIUM: 142 meq/L (ref 136–145)
TOTAL PROTEIN: 6 g/dL — AB (ref 6.4–8.3)

## 2016-12-11 LAB — CBC WITH DIFFERENTIAL/PLATELET
BASO%: 0.2 % (ref 0.0–2.0)
Basophils Absolute: 0 10*3/uL (ref 0.0–0.1)
EOS ABS: 0 10*3/uL (ref 0.0–0.5)
EOS%: 0.6 % (ref 0.0–7.0)
HEMATOCRIT: 37.7 % (ref 34.8–46.6)
HEMOGLOBIN: 12.6 g/dL (ref 11.6–15.9)
LYMPH%: 13.2 % — ABNORMAL LOW (ref 14.0–49.7)
MCH: 31.4 pg (ref 25.1–34.0)
MCHC: 33.4 g/dL (ref 31.5–36.0)
MCV: 94 fL (ref 79.5–101.0)
MONO#: 0.6 10*3/uL (ref 0.1–0.9)
MONO%: 8.7 % (ref 0.0–14.0)
NEUT%: 77.3 % — ABNORMAL HIGH (ref 38.4–76.8)
NEUTROS ABS: 5.1 10*3/uL (ref 1.5–6.5)
PLATELETS: 205 10*3/uL (ref 145–400)
RBC: 4.01 10*6/uL (ref 3.70–5.45)
RDW: 14.8 % — AB (ref 11.2–14.5)
WBC: 6.5 10*3/uL (ref 3.9–10.3)
lymph#: 0.9 10*3/uL (ref 0.9–3.3)

## 2016-12-11 MED ORDER — PROCHLORPERAZINE MALEATE 10 MG PO TABS: 10.0000 mg | ORAL_TABLET | Freq: Once | ORAL | Status: DC

## 2016-12-11 MED ORDER — BORTEZOMIB CHEMO SQ INJECTION 3.5 MG (2.5MG/ML)
1.3000 mg/m2 | Freq: Once | INTRAMUSCULAR | Status: AC
  Administered 2016-12-11: 2 mg via SUBCUTANEOUS
  Filled 2016-12-11: qty 2

## 2016-12-11 NOTE — Patient Instructions (Signed)
[facility] Discharge Instructions for Patients Receiving Chemotherapy  Today you received the following chemotherapy agents: velcade  To help prevent nausea and vomiting after your treatment, we encourage you to take your nausea medication as directed.   If you develop nausea and vomiting that is not controlled by your nausea medication, call the clinic.   BELOW ARE SYMPTOMS THAT SHOULD BE REPORTED IMMEDIATELY:  *FEVER GREATER THAN 100.5 F  *CHILLS WITH OR WITHOUT FEVER  NAUSEA AND VOMITING THAT IS NOT CONTROLLED WITH YOUR NAUSEA MEDICATION  *UNUSUAL SHORTNESS OF BREATH  *UNUSUAL BRUISING OR BLEEDING  TENDERNESS IN MOUTH AND THROAT WITH OR WITHOUT PRESENCE OF ULCERS  *URINARY PROBLEMS  *BOWEL PROBLEMS  UNUSUAL RASH Items with * indicate a potential emergency and should be followed up as soon as possible.  Feel free to call the clinic you have any questions or concerns. The clinic phone number is [phone].  Please show the CHEMO ALERT CARD at check-in to the Emergency Department and triage nurse.   

## 2016-12-18 ENCOUNTER — Other Ambulatory Visit (HOSPITAL_BASED_OUTPATIENT_CLINIC_OR_DEPARTMENT_OTHER): Payer: PPO

## 2016-12-18 ENCOUNTER — Ambulatory Visit: Payer: PPO | Admitting: Nutrition

## 2016-12-18 ENCOUNTER — Ambulatory Visit (HOSPITAL_BASED_OUTPATIENT_CLINIC_OR_DEPARTMENT_OTHER): Payer: PPO

## 2016-12-18 VITALS — BP 143/69 | HR 80 | Temp 98.1°F | Resp 18

## 2016-12-18 DIAGNOSIS — C9000 Multiple myeloma not having achieved remission: Secondary | ICD-10-CM

## 2016-12-18 DIAGNOSIS — Z5112 Encounter for antineoplastic immunotherapy: Secondary | ICD-10-CM

## 2016-12-18 LAB — CBC WITH DIFFERENTIAL/PLATELET
BASO%: 0.3 % (ref 0.0–2.0)
BASOS ABS: 0 10*3/uL (ref 0.0–0.1)
EOS ABS: 0.1 10*3/uL (ref 0.0–0.5)
EOS%: 1.3 % (ref 0.0–7.0)
HEMATOCRIT: 39.3 % (ref 34.8–46.6)
HEMOGLOBIN: 13 g/dL (ref 11.6–15.9)
LYMPH%: 14.7 % (ref 14.0–49.7)
MCH: 31.2 pg (ref 25.1–34.0)
MCHC: 33.2 g/dL (ref 31.5–36.0)
MCV: 93.9 fL (ref 79.5–101.0)
MONO#: 0.6 10*3/uL (ref 0.1–0.9)
MONO%: 9.3 % (ref 0.0–14.0)
NEUT#: 5 10*3/uL (ref 1.5–6.5)
NEUT%: 74.4 % (ref 38.4–76.8)
PLATELETS: 234 10*3/uL (ref 145–400)
RBC: 4.18 10*6/uL (ref 3.70–5.45)
RDW: 16.2 % — ABNORMAL HIGH (ref 11.2–14.5)
WBC: 6.7 10*3/uL (ref 3.9–10.3)
lymph#: 1 10*3/uL (ref 0.9–3.3)

## 2016-12-18 LAB — COMPREHENSIVE METABOLIC PANEL
ALT: 18 U/L (ref 0–55)
ANION GAP: 10 meq/L (ref 3–11)
AST: 15 U/L (ref 5–34)
Albumin: 3.6 g/dL (ref 3.5–5.0)
Alkaline Phosphatase: 94 U/L (ref 40–150)
BILIRUBIN TOTAL: 0.32 mg/dL (ref 0.20–1.20)
BUN: 12.9 mg/dL (ref 7.0–26.0)
CALCIUM: 9 mg/dL (ref 8.4–10.4)
CHLORIDE: 107 meq/L (ref 98–109)
CO2: 25 meq/L (ref 22–29)
Creatinine: 0.7 mg/dL (ref 0.6–1.1)
EGFR: 87 mL/min/{1.73_m2} — ABNORMAL LOW (ref >90)
Glucose: 122 mg/dl (ref 70–140)
Potassium: 3.8 mEq/L (ref 3.5–5.1)
Sodium: 142 mEq/L (ref 136–145)
TOTAL PROTEIN: 5.7 g/dL — AB (ref 6.4–8.3)

## 2016-12-18 MED ORDER — BORTEZOMIB CHEMO SQ INJECTION 3.5 MG (2.5MG/ML)
1.3000 mg/m2 | Freq: Once | INTRAMUSCULAR | Status: AC
  Administered 2016-12-18: 2 mg via SUBCUTANEOUS
  Filled 2016-12-18: qty 2

## 2016-12-18 MED ORDER — PROCHLORPERAZINE MALEATE 10 MG PO TABS: 10.0000 mg | ORAL_TABLET | Freq: Once | ORAL | Status: DC

## 2016-12-18 NOTE — Progress Notes (Signed)
Nutrition follow-up completed with patient receiving Velcade for multiple myeloma. Patient reports poor appetite and taste changes. Weight decreased and documented as 105.8 pounds on May 15, down from 118 pounds on January 30. Patient will consume boost milkshakes 3 times a week. States she tried Marinol, however, it did not work. She no longer takes it. Patient continues with very poor oral intake.  Nutrition diagnosis: Inadequate oral intake continues.  Intervention: I educated patient on strategies for eating small frequent meals and snacks utilizing high-calorie, high-protein foods. Educated patient on strategies for improving taste alterations. Recommended patient try to consume at least one Ensure Plus or boost plus daily. Fact sheets were provided.  Questions were answered.  Teach back method used.  Contact information given.  Monitoring, evaluation, goals: Patient will tolerate increased calories and protein to minimize further weight loss.  Next visit: Wednesday, June 20, during infusion.  **Disclaimer: This note was dictated with voice recognition software. Similar sounding words can inadvertently be transcribed and this note may contain transcription errors which may not have been corrected upon publication of note.**

## 2016-12-18 NOTE — Patient Instructions (Signed)
[facility] Discharge Instructions for Patients Receiving Chemotherapy  Today you received the following chemotherapy agents:  Velcade  To help prevent nausea and vomiting after your treatment, we encourage you to take your nausea medication as prescribed.   If you develop nausea and vomiting that is not controlled by your nausea medication, call the clinic.   BELOW ARE SYMPTOMS THAT SHOULD BE REPORTED IMMEDIATELY:  *FEVER GREATER THAN 100.5 F  *CHILLS WITH OR WITHOUT FEVER  NAUSEA AND VOMITING THAT IS NOT CONTROLLED WITH YOUR NAUSEA MEDICATION  *UNUSUAL SHORTNESS OF BREATH  *UNUSUAL BRUISING OR BLEEDING  TENDERNESS IN MOUTH AND THROAT WITH OR WITHOUT PRESENCE OF ULCERS  *URINARY PROBLEMS  *BOWEL PROBLEMS  UNUSUAL RASH Items with * indicate a potential emergency and should be followed up as soon as possible.  Feel free to call the clinic you have any questions or concerns. The clinic phone number is [phone].  Please show the CHEMO ALERT CARD at check-in to the Emergency Department and triage nurse.   

## 2016-12-25 ENCOUNTER — Other Ambulatory Visit: Payer: PPO

## 2016-12-25 ENCOUNTER — Ambulatory Visit: Payer: PPO

## 2017-01-01 ENCOUNTER — Ambulatory Visit (HOSPITAL_BASED_OUTPATIENT_CLINIC_OR_DEPARTMENT_OTHER): Payer: PPO | Admitting: Hematology & Oncology

## 2017-01-01 ENCOUNTER — Ambulatory Visit (HOSPITAL_BASED_OUTPATIENT_CLINIC_OR_DEPARTMENT_OTHER): Payer: PPO

## 2017-01-01 ENCOUNTER — Other Ambulatory Visit (HOSPITAL_BASED_OUTPATIENT_CLINIC_OR_DEPARTMENT_OTHER): Payer: PPO

## 2017-01-01 ENCOUNTER — Other Ambulatory Visit: Payer: Self-pay | Admitting: *Deleted

## 2017-01-01 VITALS — BP 110/65 | HR 89 | Temp 97.8°F | Resp 18 | Wt 109.0 lb

## 2017-01-01 DIAGNOSIS — C9000 Multiple myeloma not having achieved remission: Secondary | ICD-10-CM

## 2017-01-01 DIAGNOSIS — Z5112 Encounter for antineoplastic immunotherapy: Secondary | ICD-10-CM

## 2017-01-01 LAB — CBC WITH DIFFERENTIAL (CANCER CENTER ONLY)
BASO#: 0 10*3/uL (ref 0.0–0.2)
BASO%: 0.1 % (ref 0.0–2.0)
EOS%: 0.6 % (ref 0.0–7.0)
Eosinophils Absolute: 0 10*3/uL (ref 0.0–0.5)
HCT: 39.3 % (ref 34.8–46.6)
HGB: 13.2 g/dL (ref 11.6–15.9)
LYMPH#: 0.9 10*3/uL (ref 0.9–3.3)
LYMPH%: 12.8 % — AB (ref 14.0–48.0)
MCH: 32 pg (ref 26.0–34.0)
MCHC: 33.6 g/dL (ref 32.0–36.0)
MCV: 95 fL (ref 81–101)
MONO#: 0.6 10*3/uL (ref 0.1–0.9)
MONO%: 7.9 % (ref 0.0–13.0)
NEUT%: 78.6 % (ref 39.6–80.0)
NEUTROS ABS: 5.6 10*3/uL (ref 1.5–6.5)
PLATELETS: 245 10*3/uL (ref 145–400)
RBC: 4.13 10*6/uL (ref 3.70–5.32)
RDW: 13.8 % (ref 11.1–15.7)
WBC: 7.2 10*3/uL (ref 3.9–10.0)

## 2017-01-01 LAB — CMP (CANCER CENTER ONLY)
ALK PHOS: 94 U/L — AB (ref 26–84)
ALT: 8 U/L — AB (ref 10–47)
AST: 21 U/L (ref 11–38)
Albumin: 3.7 g/dL (ref 3.3–5.5)
BUN: 19 mg/dL (ref 7–22)
CO2: 29 mEq/L (ref 18–33)
Calcium: 9.4 mg/dL (ref 8.0–10.3)
Chloride: 103 mEq/L (ref 98–108)
Creat: 0.6 mg/dl (ref 0.6–1.2)
Glucose, Bld: 113 mg/dL (ref 73–118)
POTASSIUM: 2.9 meq/L — AB (ref 3.3–4.7)
SODIUM: 139 meq/L (ref 128–145)
TOTAL PROTEIN: 6.3 g/dL — AB (ref 6.4–8.1)
Total Bilirubin: 0.6 mg/dl (ref 0.20–1.60)

## 2017-01-01 MED ORDER — BORTEZOMIB CHEMO SQ INJECTION 3.5 MG (2.5MG/ML)
1.3000 mg/m2 | Freq: Once | INTRAMUSCULAR | Status: AC
  Administered 2017-01-01: 2 mg via SUBCUTANEOUS
  Filled 2017-01-01: qty 2

## 2017-01-01 NOTE — Telephone Encounter (Signed)
Critical Value Potassium 2.9 Dr Ennever notified. No orders at this time 

## 2017-01-01 NOTE — Patient Instructions (Signed)

## 2017-01-01 NOTE — Progress Notes (Signed)
Hematology and Oncology Follow Up Visit  Debra Ruiz [id number] 03-30-1946 71 y.o. 01/01/2017   Principle Diagnosis:   IgG Kappa myeloma - compression fractures - 13q-, +11, 14q+ - hyperdiploid  Current Therapy:    S/p Kyphoplasty of T11 and T12  Velcade/Revlimid/Decadron -status post cycle #5 - Revlimid stopped on (  11/06/2016  XRT to lower spine-completed on 09/25/2016  Xgeva 148m Sq q  3 month - next dose in July 2018     Interim History:  [name] back for follow-up. She continues to do quite well. She is not using the walker as much. Her back is not hurting as much as. Most of her problems now are up by her ribs. She has seen orthopedicsurgery before. As such, I told her to make an appointment to see the orthopedic surgeon.  Her myeloma studies have looked incredibly well. Her M spike is now down to 0.2 g/dL. Her IgG level is now 431 mg/dL. Her Kappa Light chain is now down to 0.5 mg/dL.  Her appetite is getting better. She's gained some weight.   She's had no rashes. She's had no fever. She's had no bleeding.   Overall,  her performance status is ECOG 2.   Medications:  Current Outpatient Prescriptions:  .  ALPRAZolam (XANAX) 0.25 MG tablet, Take 1 tablet (0.25 mg total) by mouth 2 (two) times daily as needed for anxiety., Disp: 60 tablet, Rfl: 0 .  amLODipine (NORVASC) 5 MG tablet, Take 1 tablet (5 mg total) by mouth daily., Disp: 90 tablet, Rfl: 0 .  aspirin EC 81 MG tablet, Take 81 mg by mouth daily., Disp: , Rfl:  .  atorvastatin (LIPITOR) 20 MG tablet, Take 1 tablet (20 mg total) by mouth daily., Disp: 90 tablet, Rfl: 1 .  dexamethasone (DECADRON) 4 MG tablet, Take 5 pills with food once a week, Disp: 60 tablet, Rfl: 3 .  dronabinol (MARINOL) 2.5 MG capsule, Take 1 capsule (2.5 mg total) by mouth 2 (two) times daily before lunch and supper., Disp: 60 capsule, Rfl: 0 .  famciclovir (FAMVIR) 250 MG tablet, Take 1 tablet (250 mg total) by mouth daily., Disp: 30  tablet, Rfl: 12 .  hydrochlorothiazide (HYDRODIURIL) 25 MG tablet, TAKE 1 TABLET EVERY DAY, Disp: 90 tablet, Rfl: 2 .  nebivolol (BYSTOLIC) 5 MG tablet, Take 1 tablet (5 mg total) by mouth daily., Disp: 30 tablet, Rfl: 2 .  potassium chloride SA (K-DUR,KLOR-CON) 20 MEQ tablet, Take 1 tablet (20 mEq total) by mouth daily., Disp: 90 tablet, Rfl: 1 .  prochlorperazine (COMPAZINE) 10 MG tablet, TAKE 1 TABLET(10 MG) BY MOUTH EVERY 6 HOURS AS NEEDED FOR NAUSEA OR VOMITING, Disp: 360 tablet, Rfl: 3 .  traMADol (ULTRAM) 50 MG tablet, Take 1-2 pills, IF NEEDED, every 6 hrs for pain., Disp: 90 tablet, Rfl: 0  Allergies:  Allergies  Allergen Reactions  . Crestor [Rosuvastatin Calcium]     Causes liver functions to be elevated    Past Medical History, Surgical history, Social history, and Family History were reviewed and updated.  Review of Systems: As above  Physical Exam:  weight is 109 lb (49.4 kg). Her oral temperature is 97.8 F (36.6 C). Her blood pressure is 110/65 and her pulse is 89. Her respiration is 18 and oxygen saturation is 100%.   Wt Readings from Last 3 Encounters:  01/01/17 109 lb (49.4 kg)  12/03/16 105 lb 12.8 oz (48 kg)  11/13/16 106 lb 12.8 oz (48.4 kg)  Elderly appearing white female. She is in moderate distress secondary to pain. Her headache exam shows no ocular or oral lesions. She has no palpable cervical or supraclavicular lymph nodes. Lungs are clear bilaterally. Cardiac exam regular rate and rhythm with no murmurs, rubs or bruits. Abdomen is soft. She has good bowel sounds. There is no fluid wave. There is no palpable liver or spleen tip. Back exam shows some slight kyphosis. She has tenderness in the lower thoracic and lumbosacral spine. There may be some slight muscle spasms noted in the paravertebral area. Extremities shows no clubbing, cyanosis or edema. She has decent range of motion of her joints. She has 4/5 strength. Skin exam shows no rashes, ecchymoses or  petechia. Neurological exam shows no focal neurological deficits.  Lab Results  Component Value Date   WBC 7.2 01/01/2017   HGB 13.2 01/01/2017   HCT 39.3 01/01/2017   MCV 95 01/01/2017   PLT 245 01/01/2017     Chemistry      Component Value Date/Time   NA 139 01/01/2017 1140   NA 142 12/18/2016 1056   K 2.9 (LL) 01/01/2017 1140   K 3.8 12/18/2016 1056   CL 103 01/01/2017 1140   CO2 29 01/01/2017 1140   CO2 25 12/18/2016 1056   BUN 19 01/01/2017 1140   BUN 12.9 12/18/2016 1056   CREATININE 0.6 01/01/2017 1140   CREATININE 0.7 12/18/2016 1056      Component Value Date/Time   CALCIUM 9.4 01/01/2017 1140   CALCIUM 9.0 12/18/2016 1056   ALKPHOS 94 (H) 01/01/2017 1140   ALKPHOS 94 12/18/2016 1056   AST 21 01/01/2017 1140   AST 15 12/18/2016 1056   ALT 8 (L) 01/01/2017 1140   ALT 18 12/18/2016 1056   BILITOT 0.60 01/01/2017 1140   BILITOT 0.32 12/18/2016 1056         Impression and Plan: [name] is a 71 year old white female with IgG kappa myeloma.  She still looks quite good. She is off Revlimid. She enjoys been off Revlimid.  We will see what her myeloma studies look like. Hopefully, they will still be quite low so we can keep her off Revlimid.  We will plan to get her back here in 5 weeks. She wants a week off in July so that she can go [place] the mountains. I think that she will enjoy this.  Hopefully, she will be able to get to the orthopedic surgeon. I think that it will take another year or so before her bones heal up. She will get Delton See which she comes back  [name], MD 6/13/201812:39 PM

## 2017-01-02 LAB — KAPPA/LAMBDA LIGHT CHAINS
IG KAPPA FREE LIGHT CHAIN: 3.5 mg/L (ref 3.3–19.4)
Ig Lambda Free Light Chain: 2.7 mg/L — ABNORMAL LOW (ref 5.7–26.3)
KAPPA/LAMBDA FLC RATIO: 1.3 (ref 0.26–1.65)

## 2017-01-02 LAB — IGG, IGA, IGM
IgA, Qn, Serum: 12 mg/dL — ABNORMAL LOW (ref 87–352)
IgG, Qn, Serum: 285 mg/dL — ABNORMAL LOW (ref 700–1600)
IgM, Qn, Serum: 12 mg/dL — ABNORMAL LOW (ref 26–217)

## 2017-01-03 LAB — PROTEIN ELECTROPHORESIS, SERUM, WITH REFLEX
A/G RATIO SPE: 1.7 (ref 0.7–1.7)
ALPHA 2: 0.7 g/dL (ref 0.4–1.0)
Albumin: 3.8 g/dL (ref 2.9–4.4)
Alpha 1: 0.3 g/dL (ref 0.0–0.4)
BETA: 1 g/dL (ref 0.7–1.3)
GAMMA GLOBULIN: 0.3 g/dL — AB (ref 0.4–1.8)
GLOBULIN, TOTAL: 2.2 g/dL (ref 2.2–3.9)
Interpretation(See Below): 0
M-Spike, %: 0.1 g/dL — ABNORMAL HIGH
TOTAL PROTEIN: 6 g/dL (ref 6.0–8.5)

## 2017-01-06 DIAGNOSIS — H1789 Other corneal scars and opacities: Secondary | ICD-10-CM | POA: Diagnosis not present

## 2017-01-06 DIAGNOSIS — H04123 Dry eye syndrome of bilateral lacrimal glands: Secondary | ICD-10-CM | POA: Diagnosis not present

## 2017-01-06 DIAGNOSIS — H52203 Unspecified astigmatism, bilateral: Secondary | ICD-10-CM | POA: Diagnosis not present

## 2017-01-06 DIAGNOSIS — H25813 Combined forms of age-related cataract, bilateral: Secondary | ICD-10-CM | POA: Diagnosis not present

## 2017-01-08 ENCOUNTER — Ambulatory Visit (HOSPITAL_BASED_OUTPATIENT_CLINIC_OR_DEPARTMENT_OTHER): Payer: PPO

## 2017-01-08 ENCOUNTER — Other Ambulatory Visit (HOSPITAL_BASED_OUTPATIENT_CLINIC_OR_DEPARTMENT_OTHER): Payer: PPO

## 2017-01-08 ENCOUNTER — Ambulatory Visit: Payer: PPO | Admitting: Nutrition

## 2017-01-08 VITALS — BP 120/62 | HR 80 | Temp 98.1°F | Resp 18

## 2017-01-08 DIAGNOSIS — Z5112 Encounter for antineoplastic immunotherapy: Secondary | ICD-10-CM | POA: Diagnosis not present

## 2017-01-08 DIAGNOSIS — C9000 Multiple myeloma not having achieved remission: Secondary | ICD-10-CM

## 2017-01-08 LAB — COMPREHENSIVE METABOLIC PANEL
ALT: 16 U/L (ref 0–55)
ANION GAP: 10 meq/L (ref 3–11)
AST: 13 U/L (ref 5–34)
Albumin: 3.7 g/dL (ref 3.5–5.0)
Alkaline Phosphatase: 91 U/L (ref 40–150)
BUN: 15.4 mg/dL (ref 7.0–26.0)
CALCIUM: 8.9 mg/dL (ref 8.4–10.4)
CHLORIDE: 107 meq/L (ref 98–109)
CO2: 25 mEq/L (ref 22–29)
Creatinine: 0.7 mg/dL (ref 0.6–1.1)
EGFR: 84 mL/min/{1.73_m2} — AB (ref >90)
Glucose: 132 mg/dl (ref 70–140)
POTASSIUM: 3.5 meq/L (ref 3.5–5.1)
Sodium: 141 mEq/L (ref 136–145)
Total Bilirubin: 0.36 mg/dL (ref 0.20–1.20)
Total Protein: 5.8 g/dL — ABNORMAL LOW (ref 6.4–8.3)

## 2017-01-08 LAB — CBC WITH DIFFERENTIAL/PLATELET
BASO%: 0.2 % (ref 0.0–2.0)
BASOS ABS: 0 10*3/uL (ref 0.0–0.1)
EOS%: 0.3 % (ref 0.0–7.0)
Eosinophils Absolute: 0 10*3/uL (ref 0.0–0.5)
HEMATOCRIT: 38.8 % (ref 34.8–46.6)
HGB: 13.2 g/dL (ref 11.6–15.9)
LYMPH#: 1 10*3/uL (ref 0.9–3.3)
LYMPH%: 14.2 % (ref 14.0–49.7)
MCH: 31.7 pg (ref 25.1–34.0)
MCHC: 34 g/dL (ref 31.5–36.0)
MCV: 93.4 fL (ref 79.5–101.0)
MONO#: 0.7 10*3/uL (ref 0.1–0.9)
MONO%: 8.9 % (ref 0.0–14.0)
NEUT#: 5.6 10*3/uL (ref 1.5–6.5)
NEUT%: 76.4 % (ref 38.4–76.8)
PLATELETS: 217 10*3/uL (ref 145–400)
RBC: 4.15 10*6/uL (ref 3.70–5.45)
RDW: 15.2 % — ABNORMAL HIGH (ref 11.2–14.5)
WBC: 7.4 10*3/uL (ref 3.9–10.3)

## 2017-01-08 MED ORDER — PROCHLORPERAZINE MALEATE 10 MG PO TABS
ORAL_TABLET | ORAL | Status: AC
  Filled 2017-01-08: qty 1

## 2017-01-08 MED ORDER — BORTEZOMIB CHEMO SQ INJECTION 3.5 MG (2.5MG/ML)
1.3000 mg/m2 | Freq: Once | INTRAMUSCULAR | Status: AC
  Administered 2017-01-08: 2 mg via SUBCUTANEOUS
  Filled 2017-01-08: qty 2

## 2017-01-08 MED ORDER — PROCHLORPERAZINE MALEATE 10 MG PO TABS: 10.0000 mg | ORAL_TABLET | Freq: Once | ORAL | Status: DC

## 2017-01-08 NOTE — Progress Notes (Signed)
Nutrition follow-up completed with patient receiving Velcade for multiple myeloma. Patient reports appetite has improved. Weight increased to 109 pounds on June 13. Patient continues to try to eat high calorie foods with increased protein and boost milkshakes. Patient verbalizes no nutrition impact symptoms.  Nutrition diagnosis: Inadequate oral intake improved.  Intervention: Patient was encouraged to continue small frequent meals, and high-calorie high-protein snacks. Encouraged her to continue boost plus milkshakes. Teach back method used.  Monitoring, evaluation, goals:  Patient will continue adequate calories and protein intake to promote weight gain.  No follow-up is scheduled.  Patient does my contact information for any questions or concerns.  **Disclaimer: This note was dictated with voice recognition software. Similar sounding words can inadvertently be transcribed and this note may contain transcription errors which may not have been corrected upon publication of note.**

## 2017-01-08 NOTE — Progress Notes (Signed)
Pt stated she had a "small mouth sore that is gone now" advised pt to call Dr when and if this was to happen again, pt verbalized understanding.

## 2017-01-08 NOTE — Patient Instructions (Signed)
[facility] Discharge Instructions for Patients Receiving Chemotherapy  Today you received the following chemotherapy agents:  Velcade  To help prevent nausea and vomiting after your treatment, we encourage you to take your nausea medication as prescribed.   If you develop nausea and vomiting that is not controlled by your nausea medication, call the clinic.   BELOW ARE SYMPTOMS THAT SHOULD BE REPORTED IMMEDIATELY:  *FEVER GREATER THAN 100.5 F  *CHILLS WITH OR WITHOUT FEVER  NAUSEA AND VOMITING THAT IS NOT CONTROLLED WITH YOUR NAUSEA MEDICATION  *UNUSUAL SHORTNESS OF BREATH  *UNUSUAL BRUISING OR BLEEDING  TENDERNESS IN MOUTH AND THROAT WITH OR WITHOUT PRESENCE OF ULCERS  *URINARY PROBLEMS  *BOWEL PROBLEMS  UNUSUAL RASH Items with * indicate a potential emergency and should be followed up as soon as possible.  Feel free to call the clinic you have any questions or concerns. The clinic phone number is [phone].  Please show the CHEMO ALERT CARD at check-in to the Emergency Department and triage nurse.   

## 2017-01-09 LAB — IGG, IGA, IGM
IGA/IMMUNOGLOBULIN A, SERUM: 12 mg/dL — AB (ref 87–352)
IGM (IMMUNOGLOBIN M), SRM: 12 mg/dL — AB (ref 26–217)
IgG, Qn, Serum: 281 mg/dL — ABNORMAL LOW (ref 700–1600)

## 2017-01-09 LAB — KAPPA/LAMBDA LIGHT CHAINS
Ig Kappa Free Light Chain: 3.2 mg/L — ABNORMAL LOW (ref 3.3–19.4)
Ig Lambda Free Light Chain: 2.3 mg/L — ABNORMAL LOW (ref 5.7–26.3)
KAPPA/LAMBDA FLC RATIO: 1.39 (ref 0.26–1.65)

## 2017-01-10 DIAGNOSIS — M545 Low back pain: Secondary | ICD-10-CM | POA: Diagnosis not present

## 2017-01-10 DIAGNOSIS — M546 Pain in thoracic spine: Secondary | ICD-10-CM | POA: Diagnosis not present

## 2017-01-11 ENCOUNTER — Other Ambulatory Visit: Payer: Self-pay | Admitting: Hematology & Oncology

## 2017-01-11 DIAGNOSIS — C9000 Multiple myeloma not having achieved remission: Secondary | ICD-10-CM

## 2017-01-13 LAB — PROTEIN ELECTROPHORESIS, SERUM, WITH REFLEX
A/G Ratio: 1.7 (ref 0.7–1.7)
ALPHA 1: 0.2 g/dL (ref 0.0–0.4)
Albumin: 3.5 g/dL (ref 2.9–4.4)
Alpha 2: 0.7 g/dL (ref 0.4–1.0)
Beta: 0.9 g/dL (ref 0.7–1.3)
Gamma Globulin: 0.3 g/dL — ABNORMAL LOW (ref 0.4–1.8)
Globulin, Total: 2.1 g/dL — ABNORMAL LOW (ref 2.2–3.9)
INTERPRETATION(SEE BELOW): 0
M-Spike, %: 0.1 g/dL — ABNORMAL HIGH
TOTAL PROTEIN: 5.6 g/dL — AB (ref 6.0–8.5)

## 2017-01-15 ENCOUNTER — Other Ambulatory Visit: Payer: Self-pay | Admitting: Orthopedic Surgery

## 2017-01-15 ENCOUNTER — Ambulatory Visit (HOSPITAL_BASED_OUTPATIENT_CLINIC_OR_DEPARTMENT_OTHER): Payer: PPO

## 2017-01-15 ENCOUNTER — Other Ambulatory Visit: Payer: Self-pay | Admitting: Internal Medicine

## 2017-01-15 ENCOUNTER — Other Ambulatory Visit (HOSPITAL_BASED_OUTPATIENT_CLINIC_OR_DEPARTMENT_OTHER): Payer: PPO

## 2017-01-15 ENCOUNTER — Telehealth: Payer: Self-pay | Admitting: *Deleted

## 2017-01-15 VITALS — BP 117/52 | HR 72 | Temp 98.2°F | Resp 18

## 2017-01-15 DIAGNOSIS — C9000 Multiple myeloma not having achieved remission: Secondary | ICD-10-CM | POA: Diagnosis not present

## 2017-01-15 DIAGNOSIS — M545 Low back pain, unspecified: Secondary | ICD-10-CM

## 2017-01-15 DIAGNOSIS — Z5112 Encounter for antineoplastic immunotherapy: Secondary | ICD-10-CM

## 2017-01-15 LAB — CBC WITH DIFFERENTIAL/PLATELET
BASO%: 0.1 % (ref 0.0–2.0)
BASOS ABS: 0 10*3/uL (ref 0.0–0.1)
EOS ABS: 0 10*3/uL (ref 0.0–0.5)
EOS%: 0.6 % (ref 0.0–7.0)
HEMATOCRIT: 38 % (ref 34.8–46.6)
HGB: 12.7 g/dL (ref 11.6–15.9)
LYMPH#: 0.9 10*3/uL (ref 0.9–3.3)
LYMPH%: 12.3 % — AB (ref 14.0–49.7)
MCH: 31.4 pg (ref 25.1–34.0)
MCHC: 33.4 g/dL (ref 31.5–36.0)
MCV: 94.1 fL (ref 79.5–101.0)
MONO#: 1 10*3/uL — AB (ref 0.1–0.9)
MONO%: 13.7 % (ref 0.0–14.0)
NEUT#: 5.3 10*3/uL (ref 1.5–6.5)
NEUT%: 73.3 % (ref 38.4–76.8)
PLATELETS: 193 10*3/uL (ref 145–400)
RBC: 4.04 10*6/uL (ref 3.70–5.45)
RDW: 14 % (ref 11.2–14.5)
WBC: 7.2 10*3/uL (ref 3.9–10.3)

## 2017-01-15 LAB — COMPREHENSIVE METABOLIC PANEL
ALT: 17 U/L (ref 0–55)
ANION GAP: 9 meq/L (ref 3–11)
AST: 14 U/L (ref 5–34)
Albumin: 3.8 g/dL (ref 3.5–5.0)
Alkaline Phosphatase: 83 U/L (ref 40–150)
BILIRUBIN TOTAL: 0.37 mg/dL (ref 0.20–1.20)
BUN: 21.1 mg/dL (ref 7.0–26.0)
CALCIUM: 9.5 mg/dL (ref 8.4–10.4)
CHLORIDE: 105 meq/L (ref 98–109)
CO2: 28 meq/L (ref 22–29)
CREATININE: 0.8 mg/dL (ref 0.6–1.1)
EGFR: 80 mL/min/{1.73_m2} — AB (ref >90)
Glucose: 101 mg/dl (ref 70–140)
Potassium: 3.6 mEq/L (ref 3.5–5.1)
Sodium: 142 mEq/L (ref 136–145)
Total Protein: 6 g/dL — ABNORMAL LOW (ref 6.4–8.3)

## 2017-01-15 MED ORDER — PROCHLORPERAZINE MALEATE 10 MG PO TABS: 10.0000 mg | ORAL_TABLET | Freq: Once | ORAL | Status: DC

## 2017-01-15 MED ORDER — BORTEZOMIB CHEMO SQ INJECTION 3.5 MG (2.5MG/ML)
1.3000 mg/m2 | Freq: Once | INTRAMUSCULAR | Status: AC
  Administered 2017-01-15: 2 mg via SUBCUTANEOUS
  Filled 2017-01-15: qty 2

## 2017-01-15 NOTE — Telephone Encounter (Signed)
Pt is being treated for multiple myeloma. CPE was due in May. Could pt come in in the next 30 days for OV and BP check ? Ok to refill until then. 

## 2017-01-15 NOTE — Telephone Encounter (Signed)
LMTCB

## 2017-01-15 NOTE — Telephone Encounter (Signed)
-----   Message from [name], MD sent at 01/13/2017 12:33 PM EDT ----- Call - myeloma  Level is still very, very low!!  pete

## 2017-01-15 NOTE — Patient Instructions (Signed)
[facility] Discharge Instructions for Patients Receiving Chemotherapy  Today you received the following chemotherapy agents Velcade. To help prevent nausea and vomiting after your treatment, we encourage you to take your nausea medication as directed.  If you develop nausea and vomiting that is not controlled by your nausea medication, call the clinic.   BELOW ARE SYMPTOMS THAT SHOULD BE REPORTED IMMEDIATELY:  *FEVER GREATER THAN 100.5 F  *CHILLS WITH OR WITHOUT FEVER  NAUSEA AND VOMITING THAT IS NOT CONTROLLED WITH YOUR NAUSEA MEDICATION  *UNUSUAL SHORTNESS OF BREATH  *UNUSUAL BRUISING OR BLEEDING  TENDERNESS IN MOUTH AND THROAT WITH OR WITHOUT PRESENCE OF ULCERS  *URINARY PROBLEMS  *BOWEL PROBLEMS  UNUSUAL RASH Items with * indicate a potential emergency and should be followed up as soon as possible.  Feel free to call the clinic you have any questions or concerns. The clinic phone number is [phone].  Please show the CHEMO ALERT CARD at check-in to the Emergency Department and triage nurse.    

## 2017-01-17 NOTE — Telephone Encounter (Signed)
LMTCB

## 2017-01-23 ENCOUNTER — Ambulatory Visit: Payer: PPO

## 2017-01-23 ENCOUNTER — Other Ambulatory Visit: Payer: PPO

## 2017-01-26 ENCOUNTER — Ambulatory Visit
Admission: RE | Admit: 2017-01-26 | Discharge: 2017-01-26 | Disposition: A | Discharge status: Home / Self Care | Payer: PPO | Source: Ambulatory Visit | Attending: Orthopedic Surgery | Admitting: Orthopedic Surgery

## 2017-01-26 DIAGNOSIS — M48061 Spinal stenosis, lumbar region without neurogenic claudication: Secondary | ICD-10-CM | POA: Diagnosis not present

## 2017-01-26 DIAGNOSIS — M545 Low back pain, unspecified: Secondary | ICD-10-CM

## 2017-01-26 DIAGNOSIS — M4804 Spinal stenosis, thoracic region: Secondary | ICD-10-CM | POA: Diagnosis not present

## 2017-01-27 ENCOUNTER — Ambulatory Visit (INDEPENDENT_AMBULATORY_CARE_PROVIDER_SITE_OTHER): Payer: PPO | Admitting: Internal Medicine

## 2017-01-27 ENCOUNTER — Encounter: Payer: Self-pay | Admitting: Internal Medicine

## 2017-01-27 VITALS — BP 118/60 | HR 95 | Temp 97.6°F | Wt 111.0 lb

## 2017-01-27 DIAGNOSIS — Z13820 Encounter for screening for osteoporosis: Secondary | ICD-10-CM

## 2017-01-27 DIAGNOSIS — Z1239 Encounter for other screening for malignant neoplasm of breast: Secondary | ICD-10-CM

## 2017-01-27 DIAGNOSIS — Z1231 Encounter for screening mammogram for malignant neoplasm of breast: Secondary | ICD-10-CM | POA: Diagnosis not present

## 2017-01-27 DIAGNOSIS — F329 Major depressive disorder, single episode, unspecified: Secondary | ICD-10-CM

## 2017-01-27 DIAGNOSIS — M545 Low back pain: Secondary | ICD-10-CM

## 2017-01-27 DIAGNOSIS — Z6820 Body mass index (BMI) 20.0-20.9, adult: Secondary | ICD-10-CM | POA: Diagnosis not present

## 2017-01-27 DIAGNOSIS — F419 Anxiety disorder, unspecified: Secondary | ICD-10-CM

## 2017-01-27 DIAGNOSIS — I10 Essential (primary) hypertension: Secondary | ICD-10-CM | POA: Diagnosis not present

## 2017-01-27 DIAGNOSIS — C9001 Multiple myeloma in remission: Secondary | ICD-10-CM | POA: Diagnosis not present

## 2017-01-27 DIAGNOSIS — F32A Depression, unspecified: Secondary | ICD-10-CM

## 2017-01-27 DIAGNOSIS — F439 Reaction to severe stress, unspecified: Secondary | ICD-10-CM

## 2017-01-27 DIAGNOSIS — G8929 Other chronic pain: Secondary | ICD-10-CM

## 2017-01-27 MED ORDER — ESCITALOPRAM OXALATE 10 MG PO TABS: 10.0000 mg | ORAL_TABLET | Freq: Every day | ORAL | 2 refills | Status: DC

## 2017-01-27 NOTE — Progress Notes (Signed)
   Subjective:    Patient ID: Debra Ruiz, female    DOB: Dec 11, 1945, 71 y.o.   MRN: [id number]  HPI Patient not seen here since December 2017. She was discovered to have a compression fracture at T12 in October 2017.  Was seen by Dr. Ernestina Patches in January 2018 for pain management of thoracic compression fracture.  Subsequently saw Dr. Lynann Bologna  who diagnosed her with multiple myeloma. She's now on the care of Dr. Marin Olp as well as Dr. Lynann Bologna.  She has a history of hyperlipidemia, anxiety depression, hypertension, GE reflux, insomnia. History of dry eye syndrome.  She had a negative Cologuard  test in September 2017.  She had back issues in May 2011 and was seen by Dr. Ellene Route for spondylosis of lumbar spine worse at L3-L4 with moderately severe central canal stenosis and advanced facet arthropathy resulting in antero-listhesis. Also had diffuse broad-based disc bulge at L2-L3 with mild central canal stenosis. Market left foraminal narrowing at L4-L5 due to disc disease and facet arthropathy. Did not require back surgery.      Review of Systems has fatigue. Is dealing with chemotherapy pretty well.     Objective:   Physical Exam Blood pressure is excellent at 118/60. She looks thin. She weighs 111 pounds and previously weighed 125 pounds in May 2017. Neck is supple. No bruits. Chest clear. Cardiac exam regular rate and rhythm. Extremities without edema       Assessment & Plan:  Multiple myeloma that presented as compression fracture currently under treatment by Dr. Marin Olp  Back pain treated by Dr. Lynann Bologna  Plan: Patient is scheduled for physical examination in October. Talked with her about how to eat more and gain a little bit of weight. I think that would be helpful in terms of her energy level. Her BMI currently is 20.97. She doesn't want to see a dietitian at this point in time.

## 2017-01-28 DIAGNOSIS — M545 Low back pain: Secondary | ICD-10-CM | POA: Diagnosis not present

## 2017-01-29 ENCOUNTER — Other Ambulatory Visit: Payer: PPO

## 2017-01-29 ENCOUNTER — Ambulatory Visit: Payer: PPO

## 2017-01-29 ENCOUNTER — Ambulatory Visit: Payer: PPO | Admitting: Hematology & Oncology

## 2017-02-05 ENCOUNTER — Ambulatory Visit (HOSPITAL_BASED_OUTPATIENT_CLINIC_OR_DEPARTMENT_OTHER): Payer: PPO | Admitting: Hematology & Oncology

## 2017-02-05 ENCOUNTER — Other Ambulatory Visit (HOSPITAL_BASED_OUTPATIENT_CLINIC_OR_DEPARTMENT_OTHER): Payer: PPO

## 2017-02-05 ENCOUNTER — Ambulatory Visit (HOSPITAL_BASED_OUTPATIENT_CLINIC_OR_DEPARTMENT_OTHER): Payer: PPO

## 2017-02-05 ENCOUNTER — Ambulatory Visit: Payer: PPO

## 2017-02-05 VITALS — BP 119/63 | HR 78 | Temp 97.8°F | Resp 18 | Wt 111.0 lb

## 2017-02-05 DIAGNOSIS — Z5112 Encounter for antineoplastic immunotherapy: Secondary | ICD-10-CM | POA: Diagnosis not present

## 2017-02-05 DIAGNOSIS — C9000 Multiple myeloma not having achieved remission: Secondary | ICD-10-CM

## 2017-02-05 LAB — CMP (CANCER CENTER ONLY)
ALBUMIN: 3.4 g/dL (ref 3.3–5.5)
ALT(SGPT): 19 U/L (ref 10–47)
AST: 23 U/L (ref 11–38)
Alkaline Phosphatase: 78 U/L (ref 26–84)
BUN, Bld: 18 mg/dL (ref 7–22)
CHLORIDE: 110 meq/L — AB (ref 98–108)
CO2: 29 meq/L (ref 18–33)
CREATININE: 0.7 mg/dL (ref 0.6–1.2)
Calcium: 9.3 mg/dL (ref 8.0–10.3)
Glucose, Bld: 146 mg/dL — ABNORMAL HIGH (ref 73–118)
POTASSIUM: 3.9 meq/L (ref 3.3–4.7)
SODIUM: 141 meq/L (ref 128–145)
TOTAL PROTEIN: 5.8 g/dL — AB (ref 6.4–8.1)
Total Bilirubin: 0.8 mg/dl (ref 0.20–1.60)

## 2017-02-05 LAB — CBC WITH DIFFERENTIAL (CANCER CENTER ONLY)
BASO#: 0 10*3/uL (ref 0.0–0.2)
BASO%: 0.1 % (ref 0.0–2.0)
EOS ABS: 0 10*3/uL (ref 0.0–0.5)
EOS%: 0.5 % (ref 0.0–7.0)
HCT: 38.8 % (ref 34.8–46.6)
HEMOGLOBIN: 13.2 g/dL (ref 11.6–15.9)
LYMPH#: 1 10*3/uL (ref 0.9–3.3)
LYMPH%: 13 % — AB (ref 14.0–48.0)
MCH: 32 pg (ref 26.0–34.0)
MCHC: 34 g/dL (ref 32.0–36.0)
MCV: 94 fL (ref 81–101)
MONO#: 0.6 10*3/uL (ref 0.1–0.9)
MONO%: 7.7 % (ref 0.0–13.0)
NEUT%: 78.7 % (ref 39.6–80.0)
NEUTROS ABS: 6 10*3/uL (ref 1.5–6.5)
PLATELETS: 215 10*3/uL (ref 145–400)
RBC: 4.12 10*6/uL (ref 3.70–5.32)
RDW: 13.2 % (ref 11.1–15.7)
WBC: 7.6 10*3/uL (ref 3.9–10.0)

## 2017-02-05 MED ORDER — DENOSUMAB 120 MG/1.7ML ~~LOC~~ SOLN
120.0000 mg | Freq: Once | SUBCUTANEOUS | Status: AC
  Administered 2017-02-05: 120 mg via SUBCUTANEOUS
  Filled 2017-02-05: qty 1.7

## 2017-02-05 MED ORDER — PROCHLORPERAZINE MALEATE 10 MG PO TABS: 10.0000 mg | ORAL_TABLET | Freq: Once | ORAL | Status: DC

## 2017-02-05 MED ORDER — BORTEZOMIB CHEMO SQ INJECTION 3.5 MG (2.5MG/ML)
1.3000 mg/m2 | Freq: Once | INTRAMUSCULAR | Status: AC
  Administered 2017-02-05: 2 mg via SUBCUTANEOUS
  Filled 2017-02-05: qty 2

## 2017-02-05 NOTE — Progress Notes (Signed)
Hematology and Oncology Follow Up Visit  Debra Ruiz [id number] 1946-03-09 72 y.o. 02/05/2017   Principle Diagnosis:   IgG Kappa myeloma - compression fractures - 13q-, +11, 14q+ - hyperdiploid  Current Therapy:    S/p Kyphoplasty of T11 and T12  Velcade/Revlimid/Decadron -status post cycle #5 - Revlimid stopped on (  11/06/2016  XRT to lower spine-completed on 09/25/2016  Xgeva 153m Sq q  3 month - next dose in July 2018     Interim History:  Ms. Debra Ruiz for follow-up. She continues to do quite well. She is not using the walker anymore. She actually came walking in. Her Ruiz is not hurting as much. Most of her problems now are up by her ribs. She has seen orthopedicsurgery before. As such, I told her to make an appointment to see the orthopedic surgeon.  Her myeloma studies have looked incredibly well. Her M spike is now down to 0.1 g/dL. Her IgG level is now 281 mg/dL. Her Kappa Light chain is now down to 0.32 mg/dL.  Her appetite is getting better. She's gained some weight.   She's had no rashes. She's had no fever. She's had no bleeding.   Overall,  her performance status is ECOG 2.   Medications:  Current Outpatient Prescriptions:  .  ALPRAZolam (XANAX) 0.25 MG tablet, Take 1 tablet (0.25 mg total) by mouth 2 (two) times daily as needed for anxiety. (Patient not taking: Reported on 01/27/2017), Disp: 60 tablet, Rfl: 0 .  aspirin EC 81 MG tablet, Take 81 mg by mouth daily., Disp: , Rfl:  .  atorvastatin (LIPITOR) 20 MG tablet, Take 1 tablet (20 mg total) by mouth daily., Disp: 90 tablet, Rfl: 1 .  BYSTOLIC 5 MG tablet, TAKE 1 TABLET(5 MG) BY MOUTH DAILY, Disp: 90 tablet, Rfl: 0 .  cholecalciferol (VITAMIN D) 1000 units tablet, Take 2,000 Units by mouth daily., Disp: , Rfl:  .  dexamethasone (DECADRON) 4 MG tablet, Take 5 pills with food once a week (Patient taking differently: Take 3 pills with food once a week), Disp: 60 tablet, Rfl: 3 .  dronabinol (MARINOL) 2.5  MG capsule, Take 1 capsule (2.5 mg total) by mouth 2 (two) times daily before lunch and supper., Disp: 60 capsule, Rfl: 0 .  escitalopram (LEXAPRO) 10 MG tablet, Take 1 tablet (10 mg total) by mouth daily., Disp: 30 tablet, Rfl: 2 .  famciclovir (FAMVIR) 250 MG tablet, Take 1 tablet (250 mg total) by mouth daily., Disp: 30 tablet, Rfl: 12 .  Melatonin 10 MG TABS, Take 2 tablets by mouth daily., Disp: , Rfl:  .  potassium chloride SA (K-DUR,KLOR-CON) 20 MEQ tablet, Take 1 tablet (20 mEq total) by mouth daily., Disp: 90 tablet, Rfl: 1 .  prochlorperazine (COMPAZINE) 10 MG tablet, TAKE 1 TABLET(10 MG) BY MOUTH EVERY 6 HOURS AS NEEDED FOR NAUSEA OR VOMITING, Disp: 360 tablet, Rfl: 3 .  traMADol (ULTRAM) 50 MG tablet, TAKE 1 TO 2 TABLETS BY MOUTH EVERY 6 HOURS AS NEEDED FOR PAIN, Disp: 90 tablet, Rfl: 0  Allergies:  Allergies  Allergen Reactions  . Crestor [Rosuvastatin Calcium]     Causes liver functions to be elevated    Past Medical History, Surgical history, Social history, and Family History were reviewed and updated.  Review of Systems: As above  Physical Exam:  weight is 111 lb (50.3 kg). Her oral temperature is 97.8 F (36.6 C). Her blood pressure is 119/63 and her pulse is 78. Her respiration is  18 and oxygen saturation is 100%.   Wt Readings from Last 3 Encounters:  02/05/17 111 lb (50.3 kg)  01/27/17 111 lb (50.3 kg)  01/01/17 109 lb (49.4 kg)     Elderly appearing white female. She is in moderate distress secondary to pain. Her headache exam shows no ocular or oral lesions. She has no palpable cervical or supraclavicular lymph nodes. Lungs are clear bilaterally. Cardiac exam regular rate and rhythm with no murmurs, rubs or bruits. Abdomen is soft. She has good bowel sounds. There is no fluid wave. There is no palpable liver or spleen tip. Ruiz exam shows some slight kyphosis. She has tenderness in the lower thoracic and lumbosacral spine. There may be some slight muscle spasms  noted in the paravertebral area. Extremities shows no clubbing, cyanosis or edema. She has decent range of motion of her joints. She has 4/5 strength. Skin exam shows no rashes, ecchymoses or petechia. Neurological exam shows no focal neurological deficits.  Lab Results  Component Value Date   WBC 7.6 02/05/2017   HGB 13.2 02/05/2017   HCT 38.8 02/05/2017   MCV 94 02/05/2017   PLT 215 02/05/2017     Chemistry      Component Value Date/Time   NA 141 02/05/2017 1046   NA 142 01/15/2017 1129   K 3.9 02/05/2017 1046   K 3.6 01/15/2017 1129   CL 110 (H) 02/05/2017 1046   CO2 29 02/05/2017 1046   CO2 28 01/15/2017 1129   BUN 18 02/05/2017 1046   BUN 21.1 01/15/2017 1129   CREATININE 0.7 02/05/2017 1046   CREATININE 0.8 01/15/2017 1129      Component Value Date/Time   CALCIUM 9.3 02/05/2017 1046   CALCIUM 9.5 01/15/2017 1129   ALKPHOS 78 02/05/2017 1046   ALKPHOS 83 01/15/2017 1129   AST 23 02/05/2017 1046   AST 14 01/15/2017 1129   ALT 19 02/05/2017 1046   ALT 17 01/15/2017 1129   BILITOT 0.80 02/05/2017 1046   BILITOT 0.37 01/15/2017 1129         Impression and Plan: Debra Ruiz is a 71 year old white female with IgG kappa myeloma.  She is doing quite well. We will see what her myeloma studies show but I would have to believe that they are still showing a very good partial response.  I we are at the point where we can adjust her treatment to every other week. I this would very amenable to her. She and her husband have a lot of trips planned.   I'm just so impressed that she can now walk in; when we first saw her, she was in a wheelchair.  We will see her Ruiz in late August. I will try to give her the month of September off so that she and her husband can go on their vacation.   [name], MD 7/18/20185:25 PM

## 2017-02-05 NOTE — Patient Instructions (Signed)
Bortezomib injection What is this medicine? BORTEZOMIB (bor TEZ oh mib) is a medicine that targets proteins in cancer cells and stops the cancer cells from growing. It is used to treat multiple myeloma and mantle-cell lymphoma. This medicine may be used for other purposes; ask your health care provider or pharmacist if you have questions. COMMON BRAND NAME(S): Velcade What should I tell my health care provider before I take this medicine? They need to know if you have any of these conditions: -diabetes -heart disease -irregular heartbeat -liver disease -on hemodialysis -low blood counts, like low white blood cells, platelets, or hemoglobin -peripheral neuropathy -taking medicine for blood pressure -an unusual or allergic reaction to bortezomib, mannitol, boron, other medicines, foods, dyes, or preservatives -pregnant or trying to get pregnant -breast-feeding How should I use this medicine? This medicine is for injection into a vein or for injection under the skin. It is given by a health care professional in a hospital or clinic setting. Talk to your pediatrician regarding the use of this medicine in children. Special care may be needed. Overdosage: If you think you have taken too much of this medicine contact a poison control center or emergency room at once. NOTE: This medicine is only for you. Do not share this medicine with others. What if I miss a dose? It is important not to miss your dose. Call your doctor or health care professional if you are unable to keep an appointment. What may interact with this medicine? This medicine may interact with the following medications: -ketoconazole -rifampin -ritonavir -St. John's Wort This list may not describe all possible interactions. Give your health care provider a list of all the medicines, herbs, non-prescription drugs, or dietary supplements you use. Also tell them if you smoke, drink alcohol, or use illegal drugs. Some items may  interact with your medicine. What should I watch for while using this medicine? You may get drowsy or dizzy. Do not drive, use machinery, or do anything that needs mental alertness until you know how this medicine affects you. Do not stand or sit up quickly, especially if you are an older patient. This reduces the risk of dizzy or fainting spells. In some cases, you may be given additional medicines to help with side effects. Follow all directions for their use. Call your doctor or health care professional for advice if you get a fever, chills or sore throat, or other symptoms of a cold or flu. Do not treat yourself. This drug decreases your body's ability to fight infections. Try to avoid being around people who are sick. This medicine may increase your risk to bruise or bleed. Call your doctor or health care professional if you notice any unusual bleeding. You may need blood work done while you are taking this medicine. In some patients, this medicine may cause a serious brain infection that may cause death. If you have any problems seeing, thinking, speaking, walking, or standing, tell your doctor right away. If you cannot reach your doctor, urgently seek other source of medical care. Check with your doctor or health care professional if you get an attack of severe diarrhea, nausea and vomiting, or if you sweat a lot. The loss of too much body fluid can make it dangerous for you to take this medicine. Do not become pregnant while taking this medicine or for at least 2 months after stopping it. Women should inform their doctor if they wish to become pregnant or think they might be pregnant. Men should not  father a child while taking this medicine and for at least 2 months after stopping it. There is a potential for serious side effects to an unborn child. Talk to your health care professional or pharmacist for more information. Do not breast-feed an infant while taking this medicine or for 2 months after  stopping it. This medicine may interfere with the ability to have a child. You should talk with your doctor or health care professional if you are concerned about your fertility. What side effects may I notice from receiving this medicine? Side effects that you should report to your doctor or health care professional as soon as possible: -allergic reactions like skin rash, itching or hives, swelling of the face, lips, or tongue -breathing problems -changes in hearing -changes in vision -fast, irregular heartbeat -feeling faint or lightheaded, falls -pain, tingling, numbness in the hands or feet -right upper belly pain -seizures -swelling of the ankles, feet, hands -unusual bleeding or bruising -unusually weak or tired -vomiting -yellowing of the eyes or skin Side effects that usually do not require medical attention (report to your doctor or health care professional if they continue or are bothersome): -changes in emotions or moods -constipation -diarrhea -loss of appetite -headache -irritation at site where injected -nausea This list may not describe all possible side effects. Call your doctor for medical advice about side effects. You may report side effects to FDA at 1-800-FDA-1088. Where should I keep my medicine? This drug is given in a hospital or clinic and will not be stored at home. NOTE: This sheet is a summary. It may not cover all possible information. If you have questions about this medicine, talk to your doctor, pharmacist, or health care provider.  2018 Elsevier/Gold Standard (2016-06-06 15:53:51) Denosumab injection What is this medicine? DENOSUMAB (den oh sue mab) slows bone breakdown. Prolia is used to treat osteoporosis in women after menopause and in men. Xgeva is used to treat a high calcium level due to cancer and to prevent bone fractures and other bone problems caused by multiple myeloma or cancer bone metastases. Xgeva is also used to treat giant cell tumor  of the bone. This medicine may be used for other purposes; ask your health care provider or pharmacist if you have questions. COMMON BRAND NAME(S): Prolia, XGEVA What should I tell my health care provider before I take this medicine? They need to know if you have any of these conditions: -dental disease -having surgery or tooth extraction -infection -kidney disease -low levels of calcium or Vitamin D in the blood -malnutrition -on hemodialysis -skin conditions or sensitivity -thyroid or parathyroid disease -an unusual reaction to denosumab, other medicines, foods, dyes, or preservatives -pregnant or trying to get pregnant -breast-feeding How should I use this medicine? This medicine is for injection under the skin. It is given by a health care professional in a hospital or clinic setting. If you are getting Prolia, a special MedGuide will be given to you by the pharmacist with each prescription and refill. Be sure to read this information carefully each time. For Prolia, talk to your pediatrician regarding the use of this medicine in children. Special care may be needed. For Xgeva, talk to your pediatrician regarding the use of this medicine in children. While this drug may be prescribed for children as young as 13 years for selected conditions, precautions do apply. Overdosage: If you think you have taken too much of this medicine contact a poison control center or emergency room at once. NOTE: This   medicine is only for you. Do not share this medicine with others. What if I miss a dose? It is important not to miss your dose. Call your doctor or health care professional if you are unable to keep an appointment. What may interact with this medicine? Do not take this medicine with any of the following medications: -other medicines containing denosumab This medicine may also interact with the following medications: -medicines that lower your chance of fighting infection -steroid medicines  like prednisone or cortisone This list may not describe all possible interactions. Give your health care provider a list of all the medicines, herbs, non-prescription drugs, or dietary supplements you use. Also tell them if you smoke, drink alcohol, or use illegal drugs. Some items may interact with your medicine. What should I watch for while using this medicine? Visit your doctor or health care professional for regular checks on your progress. Your doctor or health care professional may order blood tests and other tests to see how you are doing. Call your doctor or health care professional for advice if you get a fever, chills or sore throat, or other symptoms of a cold or flu. Do not treat yourself. This drug may decrease your body's ability to fight infection. Try to avoid being around people who are sick. You should make sure you get enough calcium and vitamin D while you are taking this medicine, unless your doctor tells you not to. Discuss the foods you eat and the vitamins you take with your health care professional. See your dentist regularly. Brush and floss your teeth as directed. Before you have any dental work done, tell your dentist you are receiving this medicine. Do not become pregnant while taking this medicine or for 5 months after stopping it. Talk with your doctor or health care professional about your birth control options while taking this medicine. Women should inform their doctor if they wish to become pregnant or think they might be pregnant. There is a potential for serious side effects to an unborn child. Talk to your health care professional or pharmacist for more information. What side effects may I notice from receiving this medicine? Side effects that you should report to your doctor or health care professional as soon as possible: -allergic reactions like skin rash, itching or hives, swelling of the face, lips, or tongue -bone pain -breathing problems -dizziness -jaw  pain, especially after dental work -redness, blistering, peeling of the skin -signs and symptoms of infection like fever or chills; cough; sore throat; pain or trouble passing urine -signs of low calcium like fast heartbeat, muscle cramps or muscle pain; pain, tingling, numbness in the hands or feet; seizures -unusual bleeding or bruising -unusually weak or tired Side effects that usually do not require medical attention (report to your doctor or health care professional if they continue or are bothersome): -constipation -diarrhea -headache -joint pain -loss of appetite -muscle pain -runny nose -tiredness -upset stomach This list may not describe all possible side effects. Call your doctor for medical advice about side effects. You may report side effects to FDA at 1-800-FDA-1088. Where should I keep my medicine? This medicine is only given in a clinic, doctor's office, or other health care setting and will not be stored at home. NOTE: This sheet is a summary. It may not cover all possible information. If you have questions about this medicine, talk to your doctor, pharmacist, or health care provider.  2018 Elsevier/Gold Standard (2016-07-30 19:17:21)  

## 2017-02-06 LAB — IGG, IGA, IGM
IgA, Qn, Serum: 14 mg/dL — ABNORMAL LOW (ref 87–352)
IgG, Qn, Serum: 302 mg/dL — ABNORMAL LOW (ref 700–1600)
IgM, Qn, Serum: 11 mg/dL — ABNORMAL LOW (ref 26–217)

## 2017-02-06 LAB — KAPPA/LAMBDA LIGHT CHAINS
IG KAPPA FREE LIGHT CHAIN: 1.9 mg/L — AB (ref 3.3–19.4)
IG LAMBDA FREE LIGHT CHAIN: 2.4 mg/L — AB (ref 5.7–26.3)
KAPPA/LAMBDA FLC RATIO: 0.79 (ref 0.26–1.65)

## 2017-02-11 LAB — PROTEIN ELECTROPHORESIS, SERUM, WITH REFLEX
A/G Ratio: 1.7 (ref 0.7–1.7)
ALPHA 2: 0.8 g/dL (ref 0.4–1.0)
Albumin: 3.7 g/dL (ref 2.9–4.4)
Alpha 1: 0.2 g/dL (ref 0.0–0.4)
Beta: 0.9 g/dL (ref 0.7–1.3)
GAMMA GLOBULIN: 0.3 g/dL — AB (ref 0.4–1.8)
GLOBULIN, TOTAL: 2.2 g/dL (ref 2.2–3.9)
INTERPRETATION(SEE BELOW): 0
M-SPIKE, %: 0.1 g/dL — AB
Total Protein: 5.9 g/dL — ABNORMAL LOW (ref 6.0–8.5)

## 2017-02-15 NOTE — Patient Instructions (Addendum)
Please take care of self and try to eat more. Follow-up in October with Medicare wellness and physical examination. Continue same medications and continue to see orthopedist and oncologist.

## 2017-02-19 ENCOUNTER — Other Ambulatory Visit (HOSPITAL_BASED_OUTPATIENT_CLINIC_OR_DEPARTMENT_OTHER): Payer: PPO

## 2017-02-19 ENCOUNTER — Other Ambulatory Visit: Payer: PPO

## 2017-02-19 ENCOUNTER — Ambulatory Visit: Payer: PPO

## 2017-02-19 ENCOUNTER — Ambulatory Visit (HOSPITAL_BASED_OUTPATIENT_CLINIC_OR_DEPARTMENT_OTHER): Payer: PPO

## 2017-02-19 VITALS — BP 147/63 | HR 74 | Temp 98.0°F | Resp 18 | Ht 61.0 in

## 2017-02-19 DIAGNOSIS — C9000 Multiple myeloma not having achieved remission: Secondary | ICD-10-CM | POA: Diagnosis not present

## 2017-02-19 DIAGNOSIS — Z5112 Encounter for antineoplastic immunotherapy: Secondary | ICD-10-CM | POA: Diagnosis not present

## 2017-02-19 LAB — COMPREHENSIVE METABOLIC PANEL
ALBUMIN: 3.7 g/dL (ref 3.5–5.0)
ALK PHOS: 70 U/L (ref 40–150)
ALT: 16 U/L (ref 0–55)
AST: 15 U/L (ref 5–34)
Anion Gap: 8 mEq/L (ref 3–11)
BUN: 16.9 mg/dL (ref 7.0–26.0)
CALCIUM: 9.1 mg/dL (ref 8.4–10.4)
CO2: 27 mEq/L (ref 22–29)
CREATININE: 0.8 mg/dL (ref 0.6–1.1)
Chloride: 107 mEq/L (ref 98–109)
EGFR: 77 mL/min/{1.73_m2} — ABNORMAL LOW (ref >90)
GLUCOSE: 120 mg/dL (ref 70–140)
Potassium: 4 mEq/L (ref 3.5–5.1)
Sodium: 142 mEq/L (ref 136–145)
Total Bilirubin: 0.57 mg/dL (ref 0.20–1.20)
Total Protein: 5.8 g/dL — ABNORMAL LOW (ref 6.4–8.3)

## 2017-02-19 LAB — CBC WITH DIFFERENTIAL/PLATELET
BASO%: 0.5 % (ref 0.0–2.0)
Basophils Absolute: 0 10*3/uL (ref 0.0–0.1)
EOS%: 1 % (ref 0.0–7.0)
Eosinophils Absolute: 0.1 10*3/uL (ref 0.0–0.5)
HEMATOCRIT: 38.9 % (ref 34.8–46.6)
HEMOGLOBIN: 13.2 g/dL (ref 11.6–15.9)
LYMPH#: 1.1 10*3/uL (ref 0.9–3.3)
LYMPH%: 15.5 % (ref 14.0–49.7)
MCH: 31.8 pg (ref 25.1–34.0)
MCHC: 33.8 g/dL (ref 31.5–36.0)
MCV: 94.1 fL (ref 79.5–101.0)
MONO#: 0.7 10*3/uL (ref 0.1–0.9)
MONO%: 10.7 % (ref 0.0–14.0)
NEUT%: 72.3 % (ref 38.4–76.8)
NEUTROS ABS: 5 10*3/uL (ref 1.5–6.5)
PLATELETS: 259 10*3/uL (ref 145–400)
RBC: 4.14 10*6/uL (ref 3.70–5.45)
RDW: 14.5 % (ref 11.2–14.5)
WBC: 6.9 10*3/uL (ref 3.9–10.3)

## 2017-02-19 MED ORDER — PROCHLORPERAZINE MALEATE 10 MG PO TABS: 10.0000 mg | ORAL_TABLET | Freq: Once | ORAL | Status: DC

## 2017-02-19 MED ORDER — BORTEZOMIB CHEMO SQ INJECTION 3.5 MG (2.5MG/ML)
1.3000 mg/m2 | Freq: Once | INTRAMUSCULAR | Status: AC
  Administered 2017-02-19: 2 mg via SUBCUTANEOUS
  Filled 2017-02-19: qty 2

## 2017-02-19 NOTE — Patient Instructions (Signed)
[facility] Discharge Instructions for Patients Receiving Chemotherapy  Today you received the following chemotherapy agents:  Velcade  To help prevent nausea and vomiting after your treatment, we encourage you to take your nausea medication as prescribed.   If you develop nausea and vomiting that is not controlled by your nausea medication, call the clinic.   BELOW ARE SYMPTOMS THAT SHOULD BE REPORTED IMMEDIATELY:  *FEVER GREATER THAN 100.5 F  *CHILLS WITH OR WITHOUT FEVER  NAUSEA AND VOMITING THAT IS NOT CONTROLLED WITH YOUR NAUSEA MEDICATION  *UNUSUAL SHORTNESS OF BREATH  *UNUSUAL BRUISING OR BLEEDING  TENDERNESS IN MOUTH AND THROAT WITH OR WITHOUT PRESENCE OF ULCERS  *URINARY PROBLEMS  *BOWEL PROBLEMS  UNUSUAL RASH Items with * indicate a potential emergency and should be followed up as soon as possible.  Feel free to call the clinic you have any questions or concerns. The clinic phone number is [phone].  Please show the CHEMO ALERT CARD at check-in to the Emergency Department and triage nurse.   

## 2017-02-21 ENCOUNTER — Other Ambulatory Visit: Payer: Self-pay | Admitting: Hematology & Oncology

## 2017-02-21 DIAGNOSIS — C9000 Multiple myeloma not having achieved remission: Secondary | ICD-10-CM

## 2017-02-25 ENCOUNTER — Telehealth: Payer: Self-pay | Admitting: *Deleted

## 2017-02-25 NOTE — Telephone Encounter (Signed)
Patient would like to have a back massage to help with muscle pain she has been experiencing. She wants to make sure that Dr Marin Olp is okay with this.  Reviewed with Dr Marin Olp and he is fine with patient having a massage.   Patient is aware of his recommendation.

## 2017-03-05 ENCOUNTER — Ambulatory Visit: Payer: PPO

## 2017-03-05 ENCOUNTER — Ambulatory Visit (HOSPITAL_BASED_OUTPATIENT_CLINIC_OR_DEPARTMENT_OTHER): Payer: PPO

## 2017-03-05 ENCOUNTER — Other Ambulatory Visit (HOSPITAL_BASED_OUTPATIENT_CLINIC_OR_DEPARTMENT_OTHER): Payer: PPO

## 2017-03-05 ENCOUNTER — Other Ambulatory Visit: Payer: PPO

## 2017-03-05 VITALS — BP 141/65 | HR 76 | Temp 97.9°F | Resp 18

## 2017-03-05 DIAGNOSIS — Z5112 Encounter for antineoplastic immunotherapy: Secondary | ICD-10-CM | POA: Diagnosis not present

## 2017-03-05 DIAGNOSIS — C9000 Multiple myeloma not having achieved remission: Secondary | ICD-10-CM

## 2017-03-05 LAB — CBC WITH DIFFERENTIAL/PLATELET
BASO%: 0.1 % (ref 0.0–2.0)
BASOS ABS: 0 10*3/uL (ref 0.0–0.1)
EOS%: 1 % (ref 0.0–7.0)
Eosinophils Absolute: 0.1 10*3/uL (ref 0.0–0.5)
HCT: 39.5 % (ref 34.8–46.6)
HGB: 13.4 g/dL (ref 11.6–15.9)
LYMPH%: 14.8 % (ref 14.0–49.7)
MCH: 31.8 pg (ref 25.1–34.0)
MCHC: 33.9 g/dL (ref 31.5–36.0)
MCV: 93.8 fL (ref 79.5–101.0)
MONO#: 0.7 10*3/uL (ref 0.1–0.9)
MONO%: 9.8 % (ref 0.0–14.0)
NEUT#: 5 10*3/uL (ref 1.5–6.5)
NEUT%: 74.3 % (ref 38.4–76.8)
Platelets: 201 10*3/uL (ref 145–400)
RBC: 4.21 10*6/uL (ref 3.70–5.45)
RDW: 13.2 % (ref 11.2–14.5)
WBC: 6.8 10*3/uL (ref 3.9–10.3)
lymph#: 1 10*3/uL (ref 0.9–3.3)

## 2017-03-05 LAB — COMPREHENSIVE METABOLIC PANEL
ALT: 17 U/L (ref 0–55)
AST: 15 U/L (ref 5–34)
Albumin: 3.5 g/dL (ref 3.5–5.0)
Alkaline Phosphatase: 67 U/L (ref 40–150)
Anion Gap: 10 mEq/L (ref 3–11)
BUN: 18.2 mg/dL (ref 7.0–26.0)
CHLORIDE: 108 meq/L (ref 98–109)
CO2: 25 mEq/L (ref 22–29)
Calcium: 8.9 mg/dL (ref 8.4–10.4)
Creatinine: 0.8 mg/dL (ref 0.6–1.1)
EGFR: 72 mL/min/{1.73_m2} — ABNORMAL LOW (ref >90)
GLUCOSE: 155 mg/dL — AB (ref 70–140)
POTASSIUM: 3.7 meq/L (ref 3.5–5.1)
SODIUM: 143 meq/L (ref 136–145)
Total Bilirubin: 0.55 mg/dL (ref 0.20–1.20)
Total Protein: 5.6 g/dL — ABNORMAL LOW (ref 6.4–8.3)

## 2017-03-05 MED ORDER — PROCHLORPERAZINE MALEATE 10 MG PO TABS
10.0000 mg | ORAL_TABLET | Freq: Once | ORAL | Status: AC
  Administered 2017-03-05: 10 mg via ORAL

## 2017-03-05 MED ORDER — PROCHLORPERAZINE MALEATE 10 MG PO TABS
ORAL_TABLET | ORAL | Status: AC
  Filled 2017-03-05: qty 1

## 2017-03-05 MED ORDER — BORTEZOMIB CHEMO SQ INJECTION 3.5 MG (2.5MG/ML)
1.3000 mg/m2 | Freq: Once | INTRAMUSCULAR | Status: AC
  Administered 2017-03-05: 2 mg via SUBCUTANEOUS
  Filled 2017-03-05: qty 2

## 2017-03-05 NOTE — Patient Instructions (Signed)
[facility] Discharge Instructions for Patients Receiving Chemotherapy  Today you received the following chemotherapy agents Velcade. To help prevent nausea and vomiting after your treatment, we encourage you to take your nausea medication as directed.  If you develop nausea and vomiting that is not controlled by your nausea medication, call the clinic.   BELOW ARE SYMPTOMS THAT SHOULD BE REPORTED IMMEDIATELY:  *FEVER GREATER THAN 100.5 F  *CHILLS WITH OR WITHOUT FEVER  NAUSEA AND VOMITING THAT IS NOT CONTROLLED WITH YOUR NAUSEA MEDICATION  *UNUSUAL SHORTNESS OF BREATH  *UNUSUAL BRUISING OR BLEEDING  TENDERNESS IN MOUTH AND THROAT WITH OR WITHOUT PRESENCE OF ULCERS  *URINARY PROBLEMS  *BOWEL PROBLEMS  UNUSUAL RASH Items with * indicate a potential emergency and should be followed up as soon as possible.  Feel free to call the clinic you have any questions or concerns. The clinic phone number is [phone].  Please show the CHEMO ALERT CARD at check-in to the Emergency Department and triage nurse.    

## 2017-03-12 DIAGNOSIS — M8589 Other specified disorders of bone density and structure, multiple sites: Secondary | ICD-10-CM | POA: Diagnosis not present

## 2017-03-12 DIAGNOSIS — Z1231 Encounter for screening mammogram for malignant neoplasm of breast: Secondary | ICD-10-CM | POA: Diagnosis not present

## 2017-03-18 ENCOUNTER — Ambulatory Visit (HOSPITAL_BASED_OUTPATIENT_CLINIC_OR_DEPARTMENT_OTHER): Payer: PPO | Admitting: Hematology & Oncology

## 2017-03-18 ENCOUNTER — Ambulatory Visit (HOSPITAL_BASED_OUTPATIENT_CLINIC_OR_DEPARTMENT_OTHER): Payer: PPO

## 2017-03-18 ENCOUNTER — Other Ambulatory Visit (HOSPITAL_BASED_OUTPATIENT_CLINIC_OR_DEPARTMENT_OTHER): Payer: PPO

## 2017-03-18 VITALS — BP 124/69 | HR 86 | Temp 97.4°F | Resp 18 | Wt 112.0 lb

## 2017-03-18 DIAGNOSIS — Z5112 Encounter for antineoplastic immunotherapy: Secondary | ICD-10-CM

## 2017-03-18 DIAGNOSIS — C9000 Multiple myeloma not having achieved remission: Secondary | ICD-10-CM | POA: Diagnosis not present

## 2017-03-18 LAB — CBC WITH DIFFERENTIAL (CANCER CENTER ONLY)
BASO#: 0 10*3/uL (ref 0.0–0.2)
BASO%: 0.2 % (ref 0.0–2.0)
EOS%: 0.1 % (ref 0.0–7.0)
Eosinophils Absolute: 0 10*3/uL (ref 0.0–0.5)
HCT: 40 % (ref 34.8–46.6)
HEMOGLOBIN: 13.6 g/dL (ref 11.6–15.9)
LYMPH#: 0.9 10*3/uL (ref 0.9–3.3)
LYMPH%: 7.4 % — ABNORMAL LOW (ref 14.0–48.0)
MCH: 31.8 pg (ref 26.0–34.0)
MCHC: 34 g/dL (ref 32.0–36.0)
MCV: 94 fL (ref 81–101)
MONO#: 0.8 10*3/uL (ref 0.1–0.9)
MONO%: 7 % (ref 0.0–13.0)
NEUT%: 85.3 % — ABNORMAL HIGH (ref 39.6–80.0)
NEUTROS ABS: 10 10*3/uL — AB (ref 1.5–6.5)
Platelets: 293 10*3/uL (ref 145–400)
RBC: 4.28 10*6/uL (ref 3.70–5.32)
RDW: 12.3 % (ref 11.1–15.7)
WBC: 11.7 10*3/uL — AB (ref 3.9–10.0)

## 2017-03-18 LAB — LACTATE DEHYDROGENASE: LDH: 244 U/L (ref 125–245)

## 2017-03-18 LAB — CMP (CANCER CENTER ONLY)
ALBUMIN: 3.8 g/dL (ref 3.3–5.5)
ALT(SGPT): 20 U/L (ref 10–47)
AST: 24 U/L (ref 11–38)
Alkaline Phosphatase: 75 U/L (ref 26–84)
BILIRUBIN TOTAL: 0.9 mg/dL (ref 0.20–1.60)
BUN, Bld: 15 mg/dL (ref 7–22)
CO2: 26 meq/L (ref 18–33)
Calcium: 9.3 mg/dL (ref 8.0–10.3)
Chloride: 104 mEq/L (ref 98–108)
Creat: 0.8 mg/dl (ref 0.6–1.2)
GLUCOSE: 132 mg/dL — AB (ref 73–118)
Potassium: 3.5 mEq/L (ref 3.3–4.7)
Sodium: 141 mEq/L (ref 128–145)
Total Protein: 7.1 g/dL (ref 6.4–8.1)

## 2017-03-18 MED ORDER — BORTEZOMIB CHEMO SQ INJECTION 3.5 MG (2.5MG/ML)
1.3000 mg/m2 | Freq: Once | INTRAMUSCULAR | Status: AC
  Administered 2017-03-18: 2 mg via SUBCUTANEOUS
  Filled 2017-03-18: qty 2

## 2017-03-18 NOTE — Progress Notes (Signed)
Hematology and Oncology Follow Up Visit  Debra Ruiz [id number] 08-13-1945 71 y.o. 03/18/2017   Principle Diagnosis:   IgG Kappa myeloma - compression fractures - 13q-, +11, 14q+ - hyperdiploid  Current Therapy:    S/p Kyphoplasty of T11 and T12  Velcade/Revlimid/Decadron -status post cycle #5 - Revlimid stopped on (  11/06/2016  XRT to lower spine-completed on 09/25/2016  Xgeva 144m Sq q  3 month - next dose in October 2018     Interim History:  Debra Ruiz back for follow-up. Every time I see her, I am just amazed at how well she has done. She really feels good. She does have some back discomfort. I think this might be more so from osteoporosis. She did have a recent bone density test done. She does have some osteopenia. She is on Xgeva. She receives this every 3 months. Her next dose probably will be in October.  Her myeloma studies looked fantastic. Her M spike is still 0.1 g/dL. Her IgG level is 302 mg/dL. Her Kappa light chain was 0.2 mg/dL.  Her appetite is good. She is eating more. She's having no nausea or vomiting. She's having no change in bowel or bladder habits. She's having no rashes. There is a local reaction after the Velcade. I told her to put some ice on this area after she gets home. This might help with the local erythema.  She's had no fever. She's had no mouth sores. She has had no headache.  Overall, her performance status is ECOG 1.  Medications:  Current Outpatient Prescriptions:  .  ALPRAZolam (XANAX) 0.25 MG tablet, Take 1 tablet (0.25 mg total) by mouth 2 (two) times daily as needed for anxiety. (Patient not taking: Reported on 01/27/2017), Disp: 60 tablet, Rfl: 0 .  aspirin EC 81 MG tablet, Take 81 mg by mouth daily., Disp: , Rfl:  .  atorvastatin (LIPITOR) 20 MG tablet, Take 1 tablet (20 mg total) by mouth daily., Disp: 90 tablet, Rfl: 1 .  BYSTOLIC 5 MG tablet, TAKE 1 TABLET(5 MG) BY MOUTH DAILY, Disp: 90 tablet, Rfl: 0 .  cholecalciferol  (VITAMIN D) 1000 units tablet, Take 2,000 Units by mouth daily., Disp: , Rfl:  .  dexamethasone (DECADRON) 4 MG tablet, Take 5 pills with food once a week (Patient taking differently: Take 3 pills with food once a week), Disp: 60 tablet, Rfl: 3 .  dronabinol (MARINOL) 2.5 MG capsule, Take 1 capsule (2.5 mg total) by mouth 2 (two) times daily before lunch and supper., Disp: 60 capsule, Rfl: 0 .  escitalopram (LEXAPRO) 10 MG tablet, Take 1 tablet (10 mg total) by mouth daily., Disp: 30 tablet, Rfl: 2 .  famciclovir (FAMVIR) 250 MG tablet, Take 1 tablet (250 mg total) by mouth daily., Disp: 30 tablet, Rfl: 12 .  Melatonin 10 MG TABS, Take 2 tablets by mouth daily., Disp: , Rfl:  .  potassium chloride SA (K-DUR,KLOR-CON) 20 MEQ tablet, Take 1 tablet (20 mEq total) by mouth daily., Disp: 90 tablet, Rfl: 1 .  prochlorperazine (COMPAZINE) 10 MG tablet, TAKE 1 TABLET(10 MG) BY MOUTH EVERY 6 HOURS AS NEEDED FOR NAUSEA OR VOMITING, Disp: 360 tablet, Rfl: 3 .  traMADol (ULTRAM) 50 MG tablet, TAKE 1 TO 2 TABLETS BY MOUTH EVERY 6 HOURS AS NEEDED FOR PAIN, Disp: 90 tablet, Rfl: 0  Allergies:  Allergies  Allergen Reactions  . Crestor [Rosuvastatin Calcium]     Causes liver functions to be elevated    Past Medical  History, Surgical history, Social history, and Family History were reviewed and updated.  Review of Systems: As stated in the interim history  Physical Exam:  weight is 112 lb (50.8 kg). Her oral temperature is 97.4 F (36.3 C) (abnormal). Her blood pressure is 124/69 and her pulse is 86. Her respiration is 18 and oxygen saturation is 99%.   Wt Readings from Last 3 Encounters:  03/18/17 112 lb (50.8 kg)  02/05/17 111 lb (50.3 kg)  01/27/17 111 lb (50.3 kg)     Physical Exam  Constitutional: She is oriented to person, place, and time.  HENT:  Head: Normocephalic and atraumatic.  Mouth/Throat: Oropharynx is clear and moist.  Eyes: Pupils are equal, round, and reactive to light. EOM are  normal.  Neck: Normal range of motion.  Cardiovascular: Normal rate, regular rhythm and normal heart sounds.   Pulmonary/Chest: Effort normal and breath sounds normal.  Abdominal: Soft. Bowel sounds are normal.  Musculoskeletal: Normal range of motion. She exhibits no edema, tenderness or deformity.  Lymphadenopathy:    She has no cervical adenopathy.  Neurological: She is alert and oriented to person, place, and time.  Skin: Skin is warm and dry. No rash noted. No erythema.  Psychiatric: She has a normal mood and affect. Her behavior is normal. Judgment and thought content normal.  Vitals reviewed.    Lab Results  Component Value Date   WBC 11.7 (H) 03/18/2017   HGB 13.6 03/18/2017   HCT 40.0 03/18/2017   MCV 94 03/18/2017   PLT 293 03/18/2017     Chemistry      Component Value Date/Time   NA 141 03/18/2017 1123   NA 143 03/05/2017 1051   K 3.5 03/18/2017 1123   K 3.7 03/05/2017 1051   CL 104 03/18/2017 1123   CO2 26 03/18/2017 1123   CO2 25 03/05/2017 1051   BUN 15 03/18/2017 1123   BUN 18.2 03/05/2017 1051   CREATININE 0.8 03/18/2017 1123   CREATININE 0.8 03/05/2017 1051      Component Value Date/Time   CALCIUM 9.3 03/18/2017 1123   CALCIUM 8.9 03/05/2017 1051   ALKPHOS 75 03/18/2017 1123   ALKPHOS 67 03/05/2017 1051   AST 24 03/18/2017 1123   AST 15 03/05/2017 1051   ALT 20 03/18/2017 1123   ALT 17 03/05/2017 1051   BILITOT 0.90 03/18/2017 1123   BILITOT 0.55 03/05/2017 1051         Impression and Plan: Debra Ruiz is a 71 year old white female with IgG kappa myeloma.  It is amazing to see how well her quality of life has improved. We first saw her, she came in in a wheelchair. She was in severe pain because of myelomatous compression fractures. She received kyphoplasty.  For right now, she is just doing so well with Velcade. We will continue the Velcade. We will treat her today. She will then have off the month of September. She will be on vacation. This  is a much-needed vacation.  We have a lot of flexibility with how we can treat her. As long as her myeloma numbers are low unstable, we can be somewhat conservative with how we approach her disease.   We will see her back in one month. I reviewed all of her lab work with she and her husband. I went over my recommendations. She and her husband understand this and agree.   [name], MD 8/28/201812:46 PM

## 2017-03-18 NOTE — Progress Notes (Signed)
Pt took Compazine at home, not given at visit today.

## 2017-03-19 LAB — KAPPA/LAMBDA LIGHT CHAINS
IG KAPPA FREE LIGHT CHAIN: 1.5 mg/L — AB (ref 3.3–19.4)
Ig Lambda Free Light Chain: 2.1 mg/L — ABNORMAL LOW (ref 5.7–26.3)
KAPPA/LAMBDA FLC RATIO: 0.71 (ref 0.26–1.65)

## 2017-03-19 LAB — PROTEIN ELECTROPHORESIS, SERUM, WITH REFLEX
A/G RATIO SPE: 1.6 (ref 0.7–1.7)
Albumin: 3.9 g/dL (ref 2.9–4.4)
Alpha 1: 0.3 g/dL (ref 0.0–0.4)
Alpha 2: 0.8 g/dL (ref 0.4–1.0)
BETA: 1 g/dL (ref 0.7–1.3)
GAMMA GLOBULIN: 0.4 g/dL (ref 0.4–1.8)
Globulin, Total: 2.4 g/dL (ref 2.2–3.9)
TOTAL PROTEIN: 6.3 g/dL (ref 6.0–8.5)

## 2017-03-19 LAB — IGG, IGA, IGM
IGA/IMMUNOGLOBULIN A, SERUM: 14 mg/dL — AB (ref 87–352)
IGM (IMMUNOGLOBIN M), SRM: 14 mg/dL — AB (ref 26–217)
IgG, Qn, Serum: 340 mg/dL — ABNORMAL LOW (ref 700–1600)

## 2017-03-20 ENCOUNTER — Other Ambulatory Visit: Payer: Self-pay | Admitting: Hematology & Oncology

## 2017-03-20 ENCOUNTER — Other Ambulatory Visit: Payer: Self-pay | Admitting: Internal Medicine

## 2017-03-20 DIAGNOSIS — C9000 Multiple myeloma not having achieved remission: Secondary | ICD-10-CM

## 2017-03-20 NOTE — Telephone Encounter (Signed)
Refill all for 6 months. 

## 2017-03-25 ENCOUNTER — Other Ambulatory Visit: Payer: Self-pay | Admitting: *Deleted

## 2017-03-25 DIAGNOSIS — C9000 Multiple myeloma not having achieved remission: Secondary | ICD-10-CM

## 2017-03-25 MED ORDER — TRAMADOL HCL 50 MG PO TABS: 50.0000 mg | ORAL_TABLET | Freq: Four times a day (QID) | ORAL | 0 refills | Status: DC | PRN

## 2017-04-07 DIAGNOSIS — M545 Low back pain: Secondary | ICD-10-CM | POA: Diagnosis not present

## 2017-04-09 DIAGNOSIS — M545 Low back pain: Secondary | ICD-10-CM | POA: Diagnosis not present

## 2017-04-09 DIAGNOSIS — S22080D Wedge compression fracture of T11-T12 vertebra, subsequent encounter for fracture with routine healing: Secondary | ICD-10-CM | POA: Diagnosis not present

## 2017-04-14 DIAGNOSIS — M545 Low back pain: Secondary | ICD-10-CM | POA: Diagnosis not present

## 2017-04-14 DIAGNOSIS — S22080D Wedge compression fracture of T11-T12 vertebra, subsequent encounter for fracture with routine healing: Secondary | ICD-10-CM | POA: Diagnosis not present

## 2017-04-17 DIAGNOSIS — M545 Low back pain: Secondary | ICD-10-CM | POA: Diagnosis not present

## 2017-04-17 DIAGNOSIS — S22080D Wedge compression fracture of T11-T12 vertebra, subsequent encounter for fracture with routine healing: Secondary | ICD-10-CM | POA: Diagnosis not present

## 2017-04-17 MED ORDER — CYANOCOBALAMIN 1000 MCG/ML IJ SOLN
INTRAMUSCULAR | Status: AC
  Filled 2017-04-17: qty 1

## 2017-04-20 ENCOUNTER — Other Ambulatory Visit: Payer: Self-pay | Admitting: Internal Medicine

## 2017-04-21 ENCOUNTER — Telehealth: Payer: Self-pay | Admitting: Hematology & Oncology

## 2017-04-21 DIAGNOSIS — M545 Low back pain: Secondary | ICD-10-CM | POA: Diagnosis not present

## 2017-04-21 DIAGNOSIS — S22080D Wedge compression fracture of T11-T12 vertebra, subsequent encounter for fracture with routine healing: Secondary | ICD-10-CM | POA: Diagnosis not present

## 2017-04-21 NOTE — Telephone Encounter (Signed)
lvm to inform pt that her 10/3 appts will be at the [facility] office at 1 pm per staff msg 10/1

## 2017-04-23 ENCOUNTER — Ambulatory Visit: Payer: PPO

## 2017-04-23 ENCOUNTER — Ambulatory Visit (HOSPITAL_BASED_OUTPATIENT_CLINIC_OR_DEPARTMENT_OTHER): Payer: PPO

## 2017-04-23 ENCOUNTER — Other Ambulatory Visit (HOSPITAL_BASED_OUTPATIENT_CLINIC_OR_DEPARTMENT_OTHER): Payer: PPO

## 2017-04-23 ENCOUNTER — Ambulatory Visit (HOSPITAL_BASED_OUTPATIENT_CLINIC_OR_DEPARTMENT_OTHER): Payer: PPO | Admitting: Family

## 2017-04-23 ENCOUNTER — Other Ambulatory Visit: Payer: PPO

## 2017-04-23 VITALS — BP 125/51 | HR 75 | Temp 97.8°F | Resp 20 | Wt 113.0 lb

## 2017-04-23 DIAGNOSIS — C9000 Multiple myeloma not having achieved remission: Secondary | ICD-10-CM

## 2017-04-23 DIAGNOSIS — Z5112 Encounter for antineoplastic immunotherapy: Secondary | ICD-10-CM | POA: Diagnosis not present

## 2017-04-23 DIAGNOSIS — M549 Dorsalgia, unspecified: Secondary | ICD-10-CM

## 2017-04-23 DIAGNOSIS — G8929 Other chronic pain: Secondary | ICD-10-CM

## 2017-04-23 LAB — CBC WITH DIFFERENTIAL (CANCER CENTER ONLY)
BASO#: 0 10*3/uL (ref 0.0–0.2)
BASO%: 0.2 % (ref 0.0–2.0)
EOS%: 0.7 % (ref 0.0–7.0)
Eosinophils Absolute: 0.1 10*3/uL (ref 0.0–0.5)
HCT: 39.9 % (ref 34.8–46.6)
HGB: 13.6 g/dL (ref 11.6–15.9)
LYMPH#: 1 10*3/uL (ref 0.9–3.3)
LYMPH%: 12.3 % — AB (ref 14.0–48.0)
MCH: 32 pg (ref 26.0–34.0)
MCHC: 34.1 g/dL (ref 32.0–36.0)
MCV: 94 fL (ref 81–101)
MONO#: 0.8 10*3/uL (ref 0.1–0.9)
MONO%: 9.3 % (ref 0.0–13.0)
NEUT#: 6.4 10*3/uL (ref 1.5–6.5)
NEUT%: 77.5 % (ref 39.6–80.0)
PLATELETS: 224 10*3/uL (ref 145–400)
RBC: 4.25 10*6/uL (ref 3.70–5.32)
RDW: 12.7 % (ref 11.1–15.7)
WBC: 8.2 10*3/uL (ref 3.9–10.0)

## 2017-04-23 LAB — CMP (CANCER CENTER ONLY)
ALK PHOS: 77 U/L (ref 26–84)
ALT: 30 U/L (ref 10–47)
AST: 24 U/L (ref 11–38)
Albumin: 3.6 g/dL (ref 3.3–5.5)
BILIRUBIN TOTAL: 0.7 mg/dL (ref 0.20–1.60)
BUN: 19 mg/dL (ref 7–22)
CO2: 29 mEq/L (ref 18–33)
Calcium: 9.6 mg/dL (ref 8.0–10.3)
Chloride: 107 mEq/L (ref 98–108)
Creat: 0.9 mg/dl (ref 0.6–1.2)
GLUCOSE: 101 mg/dL (ref 73–118)
Potassium: 3.5 mEq/L (ref 3.3–4.7)
Sodium: 145 mEq/L (ref 128–145)
Total Protein: 6.1 g/dL — ABNORMAL LOW (ref 6.4–8.1)

## 2017-04-23 MED ORDER — PROCHLORPERAZINE MALEATE 10 MG PO TABS: 10.0000 mg | ORAL_TABLET | Freq: Once | ORAL | Status: DC

## 2017-04-23 MED ORDER — BORTEZOMIB CHEMO SQ INJECTION 3.5 MG (2.5MG/ML)
1.3000 mg/m2 | Freq: Once | INTRAMUSCULAR | Status: AC
  Administered 2017-04-23: 2 mg via SUBCUTANEOUS
  Filled 2017-04-23: qty 2

## 2017-04-23 NOTE — Progress Notes (Signed)
Hematology and Oncology Follow Up Visit  Debra Ruiz [id number] 1946-02-22 71 y.o. 04/23/2017   Principle Diagnosis:  IgG Kappa myeloma - compression fractures - 13q-, +11, 14q+ - hyperdiploid  Completed Therapy: S/p Kyphoplasty of T11 and T12 XRT to lower spine-completed on 09/25/2016  Current Therapy:   Velcade/Decadron -status post cycle 8 - Revlimid stopped on 11/06/2016 Xgeva '120mg'$  Sq q  3 month - next dose in October 2018   Interim History:  Ms. Debra Ruiz is here today with her sweet husband for follow-up. She is doing fairly well. Her chronic back pain continues to be a problem. She is current;y in physical therapy and has had some intermittent fatigue.  Her last M spike was not detected in August, IgG level was 340 mg/dL and Kappa Light chain was 1.5 mg/L No episodes of bleeding, no bruising or petechiae. No lymphadenopathy noted on exam.  She has had no issue with infections. No fever, chills, n/v, cough, rash, dizziness, SOB, chest pain, palpitations, abdominal pain or changes in bowel or bladder habits.  No swelling, numbness or tingling in her extremities.  She has maintained a good appetite and is staying well hydrated. Her weight is stable.   ECOG Performance Status: 1 - Symptomatic but completely ambulatory  Medications:  Allergies as of 04/23/2017      Reactions   Crestor [rosuvastatin Calcium]    Causes liver functions to be elevated      Medication List       Accurate as of 04/23/17  3:04 PM. Always use your most recent med list.          ALPRAZolam 0.25 MG tablet Commonly known as:  XANAX Take 1 tablet (0.25 mg total) by mouth 2 (two) times daily as needed for anxiety.   amLODipine 5 MG tablet Commonly known as:  NORVASC TAKE 1 TABLET(5 MG) BY MOUTH DAILY   aspirin EC 81 MG tablet Take 81 mg by mouth daily.   atorvastatin 20 MG tablet Commonly known as:  LIPITOR Take 1 tablet (20 mg total) by mouth daily.   Baclofen 5 MG Tabs TK 1 T PO TID  PRN   BYSTOLIC 5 MG tablet Generic drug:  nebivolol TAKE 1 TABLET(5 MG) BY MOUTH DAILY   cholecalciferol 1000 units tablet Commonly known as:  VITAMIN D Take 2,000 Units by mouth daily.   dexamethasone 4 MG tablet Commonly known as:  DECADRON Take 5 pills with food once a week   dronabinol 2.5 MG capsule Commonly known as:  MARINOL Take 1 capsule (2.5 mg total) by mouth 2 (two) times daily before lunch and supper.   escitalopram 10 MG tablet Commonly known as:  LEXAPRO TAKE 1 TABLET(10 MG) BY MOUTH DAILY   famciclovir 250 MG tablet Commonly known as:  FAMVIR Take 1 tablet (250 mg total) by mouth daily.   Melatonin 10 MG Tabs Take 2 tablets by mouth daily.   potassium chloride SA 20 MEQ tablet Commonly known as:  K-DUR,KLOR-CON TAKE 1 TABLET(20 MEQ) BY MOUTH DAILY   prochlorperazine 10 MG tablet Commonly known as:  COMPAZINE TAKE 1 TABLET(10 MG) BY MOUTH EVERY 6 HOURS AS NEEDED FOR NAUSEA OR VOMITING       Allergies:  Allergies  Allergen Reactions  . Crestor [Rosuvastatin Calcium]     Causes liver functions to be elevated    Past Medical History, Surgical history, Social history, and Family History were reviewed and updated.  Review of Systems: All other 10 point review of systems  is negative.   Physical Exam:  weight is 113 lb (51.3 kg). Her oral temperature is 97.8 F (36.6 C). Her blood pressure is 125/51 (abnormal) and her pulse is 75. Her respiration is 20 and oxygen saturation is 100%.   Wt Readings from Last 3 Encounters:  04/23/17 113 lb (51.3 kg)  03/18/17 112 lb (50.8 kg)  02/05/17 111 lb (50.3 kg)    Ocular: Sclerae unicteric, pupils equal, round and reactive to light Ear-nose-throat: Oropharynx clear, dentition fair Lymphatic: No cervical, supraclavicular or axillary adenopathy Lungs no rales or rhonchi, good excursion bilaterally Heart regular rate and rhythm, no murmur appreciated Abd soft, nontender, positive bowel sounds, no liver or  spleen tip palpated on exam, no fluid wave MSK no focal spinal tenderness, no joint edema Neuro: non-focal, well-oriented, appropriate affect Breasts: Deferred   Lab Results  Component Value Date   WBC 8.2 04/23/2017   HGB 13.6 04/23/2017   HCT 39.9 04/23/2017   MCV 94 04/23/2017   PLT 224 04/23/2017   No results found for: FERRITIN, IRON, TIBC, UIBC, IRONPCTSAT Lab Results  Component Value Date   RBC 4.25 04/23/2017   Lab Results  Component Value Date   KAPLAMBRATIO 0.71 03/18/2017   Lab Results  Component Value Date   IGGSERUM 340 (L) 03/18/2017   IGMSERUM 14 (L) 03/18/2017   Lab Results  Component Value Date   MSPIKE Not Observed 03/18/2017     Chemistry      Component Value Date/Time   NA 145 04/23/2017 1220   NA 143 03/05/2017 1051   K 3.5 04/23/2017 1220   K 3.7 03/05/2017 1051   CL 107 04/23/2017 1220   CO2 29 04/23/2017 1220   CO2 25 03/05/2017 1051   BUN 19 04/23/2017 1220   BUN 18.2 03/05/2017 1051   CREATININE 0.9 04/23/2017 1220   CREATININE 0.8 03/05/2017 1051      Component Value Date/Time   CALCIUM 9.6 04/23/2017 1220   CALCIUM 8.9 03/05/2017 1051   ALKPHOS 77 04/23/2017 1220   ALKPHOS 67 03/05/2017 1051   AST 24 04/23/2017 1220   AST 15 03/05/2017 1051   ALT 30 04/23/2017 1220   ALT 17 03/05/2017 1051   BILITOT 0.70 04/23/2017 1220   BILITOT 0.55 03/05/2017 1051      Impression and Plan: Ms. Debra Ruiz is a very pleasant 71 yo caucasian female with IgG Kappa Myeloma. She has done well with Velcade and has no complaints at this time. Her counts have remained stable.  She is doing PT several days a week to help with her back and mobility. We will proceed with her Velcade injection today as planned.  She would like to change her treatments to here in [place] which is fine. We will get her a new appointment calendar today and plan to see her back on 10/31.  Both she and her husband know to contact our office with any questions or concerns. We can  certainly see her sooner if need be.   Eliezer Bottom, NP 10/3/20183:04 PM

## 2017-04-24 LAB — IGG, IGA, IGM
IgA, Qn, Serum: 17 mg/dL — ABNORMAL LOW (ref 87–352)
IgG, Qn, Serum: 276 mg/dL — ABNORMAL LOW (ref 700–1600)
IgM, Qn, Serum: 10 mg/dL — ABNORMAL LOW (ref 26–217)

## 2017-04-24 LAB — KAPPA/LAMBDA LIGHT CHAINS
IG KAPPA FREE LIGHT CHAIN: 3.5 mg/L (ref 3.3–19.4)
IG LAMBDA FREE LIGHT CHAIN: 2.8 mg/L — AB (ref 5.7–26.3)
KAPPA/LAMBDA FLC RATIO: 1.25 (ref 0.26–1.65)

## 2017-04-25 DIAGNOSIS — M545 Low back pain: Secondary | ICD-10-CM | POA: Diagnosis not present

## 2017-04-25 DIAGNOSIS — S22080D Wedge compression fracture of T11-T12 vertebra, subsequent encounter for fracture with routine healing: Secondary | ICD-10-CM | POA: Diagnosis not present

## 2017-04-28 LAB — PROTEIN ELECTROPHORESIS, SERUM, WITH REFLEX
A/G Ratio: 1.6 (ref 0.7–1.7)
ALPHA 1: 0.2 g/dL (ref 0.0–0.4)
ALPHA 2: 0.8 g/dL (ref 0.4–1.0)
Albumin: 3.6 g/dL (ref 2.9–4.4)
BETA: 1 g/dL (ref 0.7–1.3)
GAMMA GLOBULIN: 0.3 g/dL — AB (ref 0.4–1.8)
GLOBULIN, TOTAL: 2.2 g/dL (ref 2.2–3.9)
Interpretation(See Below): 0
M-SPIKE, %: 0.1 g/dL — AB
Total Protein: 5.8 g/dL — ABNORMAL LOW (ref 6.0–8.5)

## 2017-04-29 DIAGNOSIS — M545 Low back pain: Secondary | ICD-10-CM | POA: Diagnosis not present

## 2017-04-29 DIAGNOSIS — S22080D Wedge compression fracture of T11-T12 vertebra, subsequent encounter for fracture with routine healing: Secondary | ICD-10-CM | POA: Diagnosis not present

## 2017-04-30 DIAGNOSIS — Z85828 Personal history of other malignant neoplasm of skin: Secondary | ICD-10-CM | POA: Diagnosis not present

## 2017-04-30 DIAGNOSIS — D225 Melanocytic nevi of trunk: Secondary | ICD-10-CM | POA: Diagnosis not present

## 2017-04-30 DIAGNOSIS — D2239 Melanocytic nevi of other parts of face: Secondary | ICD-10-CM | POA: Diagnosis not present

## 2017-04-30 DIAGNOSIS — L814 Other melanin hyperpigmentation: Secondary | ICD-10-CM | POA: Diagnosis not present

## 2017-04-30 DIAGNOSIS — L821 Other seborrheic keratosis: Secondary | ICD-10-CM | POA: Diagnosis not present

## 2017-04-30 DIAGNOSIS — D1801 Hemangioma of skin and subcutaneous tissue: Secondary | ICD-10-CM | POA: Diagnosis not present

## 2017-05-05 ENCOUNTER — Other Ambulatory Visit: Payer: PPO | Admitting: Internal Medicine

## 2017-05-05 DIAGNOSIS — S22080D Wedge compression fracture of T11-T12 vertebra, subsequent encounter for fracture with routine healing: Secondary | ICD-10-CM | POA: Diagnosis not present

## 2017-05-05 DIAGNOSIS — F32A Depression, unspecified: Secondary | ICD-10-CM

## 2017-05-05 DIAGNOSIS — C9000 Multiple myeloma not having achieved remission: Secondary | ICD-10-CM

## 2017-05-05 DIAGNOSIS — M545 Low back pain: Secondary | ICD-10-CM | POA: Diagnosis not present

## 2017-05-05 DIAGNOSIS — F329 Major depressive disorder, single episode, unspecified: Secondary | ICD-10-CM

## 2017-05-05 DIAGNOSIS — I10 Essential (primary) hypertension: Secondary | ICD-10-CM

## 2017-05-05 DIAGNOSIS — K219 Gastro-esophageal reflux disease without esophagitis: Secondary | ICD-10-CM

## 2017-05-05 DIAGNOSIS — E785 Hyperlipidemia, unspecified: Secondary | ICD-10-CM

## 2017-05-05 DIAGNOSIS — Z0000 Encounter for general adult medical examination without abnormal findings: Secondary | ICD-10-CM

## 2017-05-05 DIAGNOSIS — F419 Anxiety disorder, unspecified: Secondary | ICD-10-CM

## 2017-05-05 LAB — COMPLETE METABOLIC PANEL WITH GFR
AG RATIO: 2.4 (calc) (ref 1.0–2.5)
ALKALINE PHOSPHATASE (APISO): 67 U/L (ref 33–130)
ALT: 21 U/L (ref 6–29)
AST: 18 U/L (ref 10–35)
Albumin: 3.9 g/dL (ref 3.6–5.1)
BILIRUBIN TOTAL: 0.6 mg/dL (ref 0.2–1.2)
BUN: 21 mg/dL (ref 7–25)
CHLORIDE: 107 mmol/L (ref 98–110)
CO2: 28 mmol/L (ref 20–32)
Calcium: 8.8 mg/dL (ref 8.6–10.4)
Creat: 0.6 mg/dL (ref 0.60–0.93)
GFR, Est African American: 107 mL/min/{1.73_m2} (ref >=60)
GFR, Est Non African American: 92 mL/min/{1.73_m2} (ref >=60)
Globulin: 1.6 g/dL (calc) — ABNORMAL LOW (ref 1.9–3.7)
Glucose, Bld: 103 mg/dL — ABNORMAL HIGH (ref 65–99)
Potassium: 4 mmol/L (ref 3.5–5.3)
SODIUM: 141 mmol/L (ref 135–146)
TOTAL PROTEIN: 5.5 g/dL — AB (ref 6.1–8.1)

## 2017-05-05 LAB — TSH: TSH: 1.73 m[IU]/L (ref 0.40–4.50)

## 2017-05-05 LAB — CBC WITH DIFFERENTIAL/PLATELET
BASOS PCT: 0.6 %
Basophils Absolute: 29 cells/uL (ref 0–200)
EOS ABS: 130 {cells}/uL (ref 15–500)
Eosinophils Relative: 2.7 %
HCT: 39.6 % (ref 35.0–45.0)
HEMOGLOBIN: 13.1 g/dL (ref 11.7–15.5)
LYMPHS ABS: 763 {cells}/uL — AB (ref 850–3900)
MCH: 30.8 pg (ref 27.0–33.0)
MCHC: 33.1 g/dL (ref 32.0–36.0)
MCV: 93 fL (ref 80.0–100.0)
MPV: 9.7 fL (ref 7.5–12.5)
Monocytes Relative: 11.5 %
NEUTROS ABS: 3326 {cells}/uL (ref 1500–7800)
Neutrophils Relative %: 69.3 %
PLATELETS: 234 10*3/uL (ref 140–400)
RBC: 4.26 10*6/uL (ref 3.80–5.10)
RDW: 13 % (ref 11.0–15.0)
TOTAL LYMPHOCYTE: 15.9 %
WBC: 4.8 10*3/uL (ref 3.8–10.8)
WBCMIX: 552 {cells}/uL (ref 200–950)

## 2017-05-05 LAB — LIPID PANEL
CHOLESTEROL: 192 mg/dL (ref <200)
HDL: 83 mg/dL (ref >50)
LDL Cholesterol (Calc): 91 mg/dL (calc)
Non-HDL Cholesterol (Calc): 109 mg/dL (calc) (ref <130)
Total CHOL/HDL Ratio: 2.3 (calc) (ref <5.0)
Triglycerides: 88 mg/dL (ref <150)

## 2017-05-06 ENCOUNTER — Ambulatory Visit (INDEPENDENT_AMBULATORY_CARE_PROVIDER_SITE_OTHER): Payer: PPO | Admitting: Internal Medicine

## 2017-05-06 ENCOUNTER — Encounter: Payer: Self-pay | Admitting: Internal Medicine

## 2017-05-06 VITALS — BP 122/80 | HR 100 | Temp 99.1°F | Ht <= 58 in | Wt 115.0 lb

## 2017-05-06 DIAGNOSIS — C9001 Multiple myeloma in remission: Secondary | ICD-10-CM | POA: Diagnosis not present

## 2017-05-06 DIAGNOSIS — F329 Major depressive disorder, single episode, unspecified: Secondary | ICD-10-CM | POA: Diagnosis not present

## 2017-05-06 DIAGNOSIS — F419 Anxiety disorder, unspecified: Secondary | ICD-10-CM

## 2017-05-06 DIAGNOSIS — M545 Low back pain, unspecified: Secondary | ICD-10-CM

## 2017-05-06 DIAGNOSIS — F439 Reaction to severe stress, unspecified: Secondary | ICD-10-CM

## 2017-05-06 DIAGNOSIS — H04123 Dry eye syndrome of bilateral lacrimal glands: Secondary | ICD-10-CM | POA: Diagnosis not present

## 2017-05-06 DIAGNOSIS — E7849 Other hyperlipidemia: Secondary | ICD-10-CM | POA: Diagnosis not present

## 2017-05-06 DIAGNOSIS — K219 Gastro-esophageal reflux disease without esophagitis: Secondary | ICD-10-CM | POA: Diagnosis not present

## 2017-05-06 DIAGNOSIS — Z0000 Encounter for general adult medical examination without abnormal findings: Secondary | ICD-10-CM

## 2017-05-06 DIAGNOSIS — F32A Depression, unspecified: Secondary | ICD-10-CM

## 2017-05-06 DIAGNOSIS — I10 Essential (primary) hypertension: Secondary | ICD-10-CM | POA: Diagnosis not present

## 2017-05-06 DIAGNOSIS — G8929 Other chronic pain: Secondary | ICD-10-CM | POA: Diagnosis not present

## 2017-05-06 LAB — POCT URINALYSIS DIPSTICK
Bilirubin, UA: NEGATIVE
Blood, UA: NEGATIVE
GLUCOSE UA: NEGATIVE
Ketones, UA: NEGATIVE
Leukocytes, UA: NEGATIVE
Nitrite, UA: NEGATIVE
Protein, UA: NEGATIVE
SPEC GRAV UA: 1.02 (ref 1.010–1.025)
UROBILINOGEN UA: 0.2 U/dL
pH, UA: 7 (ref 5.0–8.0)

## 2017-05-06 MED ORDER — ESCITALOPRAM OXALATE 10 MG PO TABS: 10.0000 mg | ORAL_TABLET | Freq: Every day | ORAL | 3 refills | Status: DC

## 2017-05-06 NOTE — Progress Notes (Signed)
Subjective:    Patient ID: Debra Ruiz, female    DOB: 07/27/1945, 71 y.o.   MRN: [id number]  HPI  71 year old Female for health maintenance exam and evaluation of medical issues.  Patient has had a complicated course this year.  In August 2017 she was doing some yard work and strained her back.  She was treated with meloxicam and Flexeril. In October 2017, she was seen with left-sided sciatica with history of spondylosis L3-L4 and moderately severe canal stenosis based on MRI 2011 with anterolisthesis.  Apparently had [place] going to the bathroom and laid in September.  Was complaining of pain in left buttock going down left leg and had pain in quadriceps muscle. MRI was recommended.  MRI showed moderate compression fracture of T12.  This appears to be subacute and a benign fracture.  She was seen December 19 with back pain onset bilaterally Sunday, December 17 after going up in the attic the previous day.  Did not do any heavy lifting but was in a lot of back pain.  Was thought to have musculoskeletal pain and was given oxycodone APAP and a Sterapred 10 mg six-day Dosepak.  She subsequently sought treatment at [facility] January 9.  She saw Dr. Ernestina Patches.  Consultation with Dr. Rosana Hoes for was recommended.  She was given a thoracolumbar corset.  Was placed on baclofen.  It was felt she might need kyphoplasty.  Graft subsequently she saw Dr. Lynann Bologna at [facility].  Was found to have compression fracture at T12 which was old.  Had a superior endplate fracture at V78.  However there were multifocal marrow space lesions consistent with multiple myeloma.  She also had multifactorial stenosis at L2-L3, L3-L4 and L4-L5.  She subsequently was referred to Dr. Marin Olp who diagnosed her with multiple myeloma.  She did have T11-T12 kyphoplasty done February 1.  She was subsequently treated with Xgeva.  Also received Velcade/Revlimid/Decadron . Also received  radiation treatment by Dr. Sondra Come.  She had a lot of back pain and it took a long time for her to get better.  She lost her appetite.  Dr. Marin Olp is pleased with her results at this point.  He saw her in August and her myeloma studies looked good.  She remains on Xgeva.  He also continued her on Velcade.  She had rhinoplasty at [facility] in February 2017.  She had Zostavax vaccine February 28.  History of dry eyes syndrome and rosacea treated by ophthalmologist.  She has never had colonoscopy.  Had sigmoidoscopy in 2000.  A Cardiolite study in 1999 Dr. Wynonia Lawman that was negative for ischemia.  Patient unable to tolerate Crestor according to old records when she was under the care of Dr. Koleen Nimrod.  He apparently had increased liver functions on Crestor.  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  History of urge urinary incontinence.  Tubal ligation 1977.  She does have hypertension, hyperlipidemia, anxiety depression, GE reflux, insomnia.  Remote history of lumbar spondylosis.  Social history: Smoked years ago but currently non-smoker.  Drinks alcohol several times a week.  She is married.  History of chronic dysthymia.  Has had a fear of getting Alzheimer's disease because of family history.  In [place] worked for an [employer].  She has been married 3 times.  No children.  She raised concerns about her memory in 2003 and was sent to [facility] neuropsychiatry for testing for  possible memory disorder.  She had good verbal memory but some impairment with visual memory.  She noted improvement with depression being changed from Prozac to [place].  It was recommended at the time that she be retested in 1 year but she really has not wanted to do that.  Seems less concerned about dementia.  Family history: Father died at age 7 of an MI with history of hypertension.  Mother died at age 67 of a stroke with history of hypertension.  Brother with history of brain  tumor and cardiac defibrillator.  Twin sister who lives in [place].  Sister with history of hypertension and hyperlipidemia.  There is a strong family history of Alzheimer's disease in women in her family.    Review of Systems fatigue but appetite returning.     Objective:   Physical Exam  Constitutional: She is oriented to person, place, and time. She appears well-developed and well-nourished. No distress.  HENT:  Head: Normocephalic and atraumatic.  Right Ear: External ear normal.  Left Ear: External ear normal.  Mouth/Throat: Oropharynx is clear and moist.  Neck: Neck supple. No JVD present.  Cardiovascular: Normal rate, regular rhythm and normal heart sounds.   No murmur heard. Pulmonary/Chest: Effort normal. No respiratory distress. She has no wheezes. She has no rales.  Abdominal: Soft. Bowel sounds are normal. She exhibits no distension and no mass. There is no tenderness. There is no rebound and no guarding.  Genitourinary:  Genitourinary Comments: Pap done 2015.  Bimanual normal.  Musculoskeletal: She exhibits no edema.  Lymphadenopathy:    She has no cervical adenopathy.  Neurological: She is alert and oriented to person, place, and time. She has normal reflexes. No cranial nerve deficit. Coordination normal.  Skin: Skin is warm and dry. She is not diaphoretic.  Psychiatric: She has a normal mood and affect. Her behavior is normal. Judgment and thought content normal.          Assessment & Plan:  History of IgG kappa multiple myeloma treated by Dr. Marin Olp diagnosed by Dr. Lynann Bologna early 2018 Chemotherapy and stable.  Essential hypertension  Hyperlipidemia  History of depression  GE reflux  Anxiety  Dry eye syndrome  Spondylosis of lumbar region  Plan: Lab work drawn for physical exam is within normal limits especially lipid panel.  She was given samples of Bystolic and will return in April for 71-monthfollow-up.  T need to see Dr. JAnderson Maltaand she  has an upcoming appointment with Dr. DLynann Bolognasoon.  Subjective:   Patient presents for Medicare Annual/Subsequent preventive examination.  Review Past Medical/Family/Social:   Risk Factors  Current exercise habits:  Dietary issues discussed:   Cardiac risk factors:  Depression Screen  (Note: if answer to either of the following is "Yes", a more complete depression screening is indicated)   Over the past two weeks, have you felt down, depressed or hopeless?  Yes depressed over myeloma diagnosis Over the past two weeks, have you felt little interest or pleasure in doing things?  Yes Have you lost interest or pleasure in daily life?  Yes- see above.  Is on antidepressant medication Do you often feel hopeless? No Do you cry easily over simple problems? No   Activities of Daily Living  In your present state of health, do you have any difficulty performing the following activities?:   Driving? No  Managing money? No  Feeding yourself? No  Getting from bed to chair? No  Climbing a flight of stairs? No  Preparing food and eating?: No  Bathing or showering? No  Getting dressed: No  Getting to the toilet? No  Using the toilet:No  Moving around from place to place:  yes due to back pain In the past year have you fallen or had a near fall?:No  Are you sexually active? No  Do you have more than one partner? No   Hearing Difficulties: No  Do you often ask people to speak up or repeat themselves?  Yes may need hearing screen Do you experience ringing or noises in your ears? No  Do you have difficulty understanding soft or whispered voices? yes Do you feel that you have a problem with memory?  Yes-is raised this issue for several years Do you often misplace items? No    Home Safety:  Do you have a smoke alarm at your residence? Yes Do you have grab bars in the bathroom?  No Do you have throw rugs in your house?  No   Cognitive Testing  Alert? Yes Normal Appearance?Yes    Oriented to person? Yes Place? Yes  Time? Yes  Recall of three objects? Yes  Can perform simple calculations? Yes  Displays appropriate judgment?Yes  Can read the correct time from a watch face?Yes   List the Names of Other Physician/Practitioners you currently use:  See referral list for the physicians patient is currently seeing.     Review of Systems: See above  Objective:     General appearance: Appears stated age and thin Head: Normocephalic, without obvious abnormality, atraumatic  Eyes: conj clear, EOMi PEERLA  Ears: normal TM's and external ear canals both ears  Nose: Nares normal. Septum midline. Mucosa normal. No drainage or sinus tenderness.  Throat: lips, mucosa, and tongue normal; teeth and gums normal  Neck: no adenopathy, no carotid bruit, no JVD, supple, symmetrical, trachea midline and thyroid not enlarged, symmetric, no tenderness/mass/nodules  No CVA tenderness.  Lungs: clear to auscultation bilaterally  Breasts: normal appearance, no masses or tenderness Heart: regular rate and rhythm, S1, S2 normal, no murmur, click, rub or gallop  Abdomen: soft, non-tender; bowel sounds normal; no masses, no organomegaly  Musculoskeletal: ROM normal in all joints, no crepitus, no deformity, Normal muscle strengthen. Back  is symmetric, no curvature. Skin: Skin color, texture, turgor normal. No rashes or lesions  Lymph nodes: Cervical, supraclavicular, and axillary nodes normal.  Neurologic: CN 2 -12 Normal, Normal symmetric reflexes. Normal coordination and gait  Psych: Alert & Oriented x 3, Mood appear stable.    Assessment:    Annual wellness medicare exam   Plan:    During the course of the visit the patient was educated and counseled about appropriate screening and preventive services including:   Annual mammogram  Consider Cologuard if not willing to have colonoscopy     Patient Instructions (the written plan) was given to the patient.  Medicare  Attestation  I have personally reviewed:  The patient's medical and social history  Their use of alcohol, tobacco or illicit drugs  Their current medications and supplements  The patient's functional ability including ADLs,fall risks, home safety risks, cognitive, and hearing and visual impairment  Diet and physical activities  Evidence for depression or mood disorders  The patient's weight, height, BMI, and visual acuity have been recorded in the chart. I have made referrals, counseling, and provided education to the patient based on review of the above and I have provided the patient with a written personalized care plan for preventive services.

## 2017-05-08 DIAGNOSIS — S22080D Wedge compression fracture of T11-T12 vertebra, subsequent encounter for fracture with routine healing: Secondary | ICD-10-CM | POA: Diagnosis not present

## 2017-05-08 DIAGNOSIS — M545 Low back pain: Secondary | ICD-10-CM | POA: Diagnosis not present

## 2017-05-09 DIAGNOSIS — M545 Low back pain: Secondary | ICD-10-CM | POA: Diagnosis not present

## 2017-05-12 DIAGNOSIS — M545 Low back pain: Secondary | ICD-10-CM | POA: Diagnosis not present

## 2017-05-12 DIAGNOSIS — S22080D Wedge compression fracture of T11-T12 vertebra, subsequent encounter for fracture with routine healing: Secondary | ICD-10-CM | POA: Diagnosis not present

## 2017-05-15 DIAGNOSIS — M545 Low back pain: Secondary | ICD-10-CM | POA: Diagnosis not present

## 2017-05-15 DIAGNOSIS — S22080D Wedge compression fracture of T11-T12 vertebra, subsequent encounter for fracture with routine healing: Secondary | ICD-10-CM | POA: Diagnosis not present

## 2017-05-16 ENCOUNTER — Ambulatory Visit (INDEPENDENT_AMBULATORY_CARE_PROVIDER_SITE_OTHER): Payer: PPO | Admitting: Internal Medicine

## 2017-05-16 ENCOUNTER — Encounter: Payer: Self-pay | Admitting: Internal Medicine

## 2017-05-16 VITALS — BP 106/52 | HR 110 | Temp 101.3°F | Wt 116.0 lb

## 2017-05-16 DIAGNOSIS — C9000 Multiple myeloma not having achieved remission: Secondary | ICD-10-CM

## 2017-05-16 DIAGNOSIS — J22 Unspecified acute lower respiratory infection: Secondary | ICD-10-CM

## 2017-05-16 DIAGNOSIS — H10023 Other mucopurulent conjunctivitis, bilateral: Secondary | ICD-10-CM

## 2017-05-16 DIAGNOSIS — J040 Acute laryngitis: Secondary | ICD-10-CM | POA: Diagnosis not present

## 2017-05-16 MED ORDER — CEFTRIAXONE SODIUM 1 G IJ SOLR
1.0000 g | Freq: Once | INTRAMUSCULAR | Status: AC
  Administered 2017-05-16: 1 g via INTRAMUSCULAR

## 2017-05-16 MED ORDER — LEVOFLOXACIN 250 MG PO TABS: 250.0000 mg | ORAL_TABLET | Freq: Every day | ORAL | 0 refills | Status: DC

## 2017-05-16 MED ORDER — OFLOXACIN 0.3 % OP SOLN: 1.0000 [drp] | Freq: Four times a day (QID) | OPHTHALMIC | 0 refills | Status: DC

## 2017-05-16 NOTE — Patient Instructions (Signed)
Ocuflox ophthalmic drops 2 drops in each eye 4 times a day for 5 days.  Levaquin 250 mg daily with food for 10 days.  May take over-the-counter cough preparation.  Rest and drink plenty of fluids.  Call if not better by Monday or sooner if worse.  1 g IM Rocephin.

## 2017-05-16 NOTE — Progress Notes (Signed)
   Subjective:    Patient ID: Debra Ruiz, female    DOB: Aug 31, 1945, 71 y.o.   MRN: [id number]  HPI 71 year old Female with 3-day history of URI symptoms.  Has hoarseness and congestion.  Has noticed redness in eyes.  Has malaise and fatigue.  Productive cough.  No documented fever.  Denies shaking chills.  History of multiple myeloma being treated by Dr. Marin Olp    Review of Systems see above     Objective:   Physical Exam She has drainage from both eyes and bilateral eye redness.  She is hoarse when she speaks.  Pharynx is injected without exudate.  TMs are clear.  Neck is supple without adenopathy.  Chest clear to auscultation without rales or wheezing.  Coughing a lot.  Cough is productive.       Assessment & Plan:  Acute lower respiratory infection  Laryngitis  Bilateral conjunctivitis  Multiple myeloma  Plan: Rocephin 1 g IM.  Levaquin 250 mg daily for 10 days.  Ocuflox ophthalmic drops 2 drops in each eye 4 times a day for 5 days.  Rest and drink plenty of fluids.  May take over-the-counter cough preparation.  Call if not better by Monday or sooner if worse.

## 2017-05-19 DIAGNOSIS — S22080D Wedge compression fracture of T11-T12 vertebra, subsequent encounter for fracture with routine healing: Secondary | ICD-10-CM | POA: Diagnosis not present

## 2017-05-19 DIAGNOSIS — M545 Low back pain: Secondary | ICD-10-CM | POA: Diagnosis not present

## 2017-05-21 ENCOUNTER — Other Ambulatory Visit (HOSPITAL_BASED_OUTPATIENT_CLINIC_OR_DEPARTMENT_OTHER): Payer: PPO

## 2017-05-21 ENCOUNTER — Other Ambulatory Visit: Payer: PPO

## 2017-05-21 ENCOUNTER — Ambulatory Visit (HOSPITAL_BASED_OUTPATIENT_CLINIC_OR_DEPARTMENT_OTHER): Payer: PPO

## 2017-05-21 ENCOUNTER — Ambulatory Visit: Payer: PPO

## 2017-05-21 VITALS — BP 108/59 | HR 86 | Temp 98.4°F | Resp 19

## 2017-05-21 DIAGNOSIS — C9000 Multiple myeloma not having achieved remission: Secondary | ICD-10-CM | POA: Diagnosis not present

## 2017-05-21 DIAGNOSIS — Z5112 Encounter for antineoplastic immunotherapy: Secondary | ICD-10-CM

## 2017-05-21 LAB — CBC WITH DIFFERENTIAL (CANCER CENTER ONLY)
BASO#: 0 10*3/uL (ref 0.0–0.2)
BASO%: 0.1 % (ref 0.0–2.0)
EOS%: 1.5 % (ref 0.0–7.0)
Eosinophils Absolute: 0.1 10*3/uL (ref 0.0–0.5)
HCT: 39.7 % (ref 34.8–46.6)
HGB: 13.3 g/dL (ref 11.6–15.9)
LYMPH#: 1 10*3/uL (ref 0.9–3.3)
LYMPH%: 11.2 % — AB (ref 14.0–48.0)
MCH: 31.7 pg (ref 26.0–34.0)
MCHC: 33.5 g/dL (ref 32.0–36.0)
MCV: 95 fL (ref 81–101)
MONO#: 0.9 10*3/uL (ref 0.1–0.9)
MONO%: 9.3 % (ref 0.0–13.0)
NEUT#: 7.1 10*3/uL — ABNORMAL HIGH (ref 1.5–6.5)
NEUT%: 77.9 % (ref 39.6–80.0)
PLATELETS: 259 10*3/uL (ref 145–400)
RBC: 4.19 10*6/uL (ref 3.70–5.32)
RDW: 12.7 % (ref 11.1–15.7)
WBC: 9.2 10*3/uL (ref 3.9–10.0)

## 2017-05-21 LAB — CMP (CANCER CENTER ONLY)
ALT: 23 U/L (ref 10–47)
AST: 22 U/L (ref 11–38)
Albumin: 3.1 g/dL — ABNORMAL LOW (ref 3.3–5.5)
Alkaline Phosphatase: 70 U/L (ref 26–84)
BUN: 23 mg/dL — AB (ref 7–22)
CHLORIDE: 104 meq/L (ref 98–108)
CO2: 28 mEq/L (ref 18–33)
Calcium: 9.2 mg/dL (ref 8.0–10.3)
Creat: 0.8 mg/dl (ref 0.6–1.2)
GLUCOSE: 137 mg/dL — AB (ref 73–118)
POTASSIUM: 4.2 meq/L (ref 3.3–4.7)
Sodium: 144 mEq/L (ref 128–145)
Total Bilirubin: 0.4 mg/dl (ref 0.20–1.60)
Total Protein: 6 g/dL — ABNORMAL LOW (ref 6.4–8.1)

## 2017-05-21 MED ORDER — BORTEZOMIB CHEMO SQ INJECTION 3.5 MG (2.5MG/ML)
1.3000 mg/m2 | Freq: Once | INTRAMUSCULAR | Status: AC
  Administered 2017-05-21: 2 mg via SUBCUTANEOUS
  Filled 2017-05-21: qty 2

## 2017-05-21 MED ORDER — DENOSUMAB 120 MG/1.7ML ~~LOC~~ SOLN
120.0000 mg | Freq: Once | SUBCUTANEOUS | Status: AC
  Administered 2017-05-21: 120 mg via SUBCUTANEOUS

## 2017-05-21 MED ORDER — DENOSUMAB 120 MG/1.7ML ~~LOC~~ SOLN
SUBCUTANEOUS | Status: AC
  Filled 2017-05-21: qty 1.7

## 2017-05-21 NOTE — Patient Instructions (Signed)
Continue same medications and return in 6 months.  Follow-up with Drs Marin Olp and [facility].

## 2017-05-21 NOTE — Patient Instructions (Signed)

## 2017-05-22 DIAGNOSIS — S22080D Wedge compression fracture of T11-T12 vertebra, subsequent encounter for fracture with routine healing: Secondary | ICD-10-CM | POA: Diagnosis not present

## 2017-05-22 DIAGNOSIS — M545 Low back pain: Secondary | ICD-10-CM | POA: Diagnosis not present

## 2017-06-02 DIAGNOSIS — H04123 Dry eye syndrome of bilateral lacrimal glands: Secondary | ICD-10-CM | POA: Diagnosis not present

## 2017-06-02 DIAGNOSIS — H0100B Unspecified blepharitis left eye, upper and lower eyelids: Secondary | ICD-10-CM | POA: Diagnosis not present

## 2017-06-02 DIAGNOSIS — H0100A Unspecified blepharitis right eye, upper and lower eyelids: Secondary | ICD-10-CM | POA: Diagnosis not present

## 2017-06-03 DIAGNOSIS — S22080D Wedge compression fracture of T11-T12 vertebra, subsequent encounter for fracture with routine healing: Secondary | ICD-10-CM | POA: Diagnosis not present

## 2017-06-03 DIAGNOSIS — M545 Low back pain: Secondary | ICD-10-CM | POA: Diagnosis not present

## 2017-06-05 ENCOUNTER — Other Ambulatory Visit: Payer: Self-pay | Admitting: Hematology & Oncology

## 2017-06-05 DIAGNOSIS — E782 Mixed hyperlipidemia: Secondary | ICD-10-CM

## 2017-06-05 DIAGNOSIS — C9000 Multiple myeloma not having achieved remission: Secondary | ICD-10-CM

## 2017-06-16 ENCOUNTER — Telehealth: Payer: Self-pay | Admitting: Internal Medicine

## 2017-06-16 MED ORDER — ESCITALOPRAM OXALATE 20 MG PO TABS: 20.0000 mg | ORAL_TABLET | Freq: Every day | ORAL | 1 refills | Status: DC

## 2017-06-16 NOTE — Telephone Encounter (Signed)
Calling for a refill on Lexapro.  States that when she was last here (October), you increased her dosage to 20mg .  You refilled 10mg  and she has been taking 2 per day, so she has ran out early and needs a refill.  They will not refill because it's too early based on the current dosage of 10mg .  So, she wants to know if we will call in the " new dosage" of 20mg  to her pharmacy.  Pharmacy:  [facility] [place] # for contact:  [phone]  Thank you.

## 2017-06-16 NOTE — Telephone Encounter (Signed)
Please order Lexapro 20 mg #90 with one refill

## 2017-06-18 ENCOUNTER — Ambulatory Visit (HOSPITAL_BASED_OUTPATIENT_CLINIC_OR_DEPARTMENT_OTHER): Payer: PPO

## 2017-06-18 ENCOUNTER — Ambulatory Visit (HOSPITAL_BASED_OUTPATIENT_CLINIC_OR_DEPARTMENT_OTHER): Payer: PPO | Admitting: Hematology & Oncology

## 2017-06-18 ENCOUNTER — Other Ambulatory Visit: Payer: Self-pay

## 2017-06-18 ENCOUNTER — Encounter: Payer: Self-pay | Admitting: Hematology & Oncology

## 2017-06-18 ENCOUNTER — Other Ambulatory Visit (HOSPITAL_BASED_OUTPATIENT_CLINIC_OR_DEPARTMENT_OTHER): Payer: PPO

## 2017-06-18 VITALS — BP 118/48 | HR 82 | Temp 98.1°F | Resp 16 | Wt 114.0 lb

## 2017-06-18 DIAGNOSIS — C9000 Multiple myeloma not having achieved remission: Secondary | ICD-10-CM

## 2017-06-18 DIAGNOSIS — Z5112 Encounter for antineoplastic immunotherapy: Secondary | ICD-10-CM | POA: Diagnosis not present

## 2017-06-18 LAB — CBC WITH DIFFERENTIAL (CANCER CENTER ONLY)
BASO#: 0 10*3/uL (ref 0.0–0.2)
BASO%: 0.2 % (ref 0.0–2.0)
EOS%: 0.8 % (ref 0.0–7.0)
Eosinophils Absolute: 0.1 10*3/uL (ref 0.0–0.5)
HEMATOCRIT: 40.8 % (ref 34.8–46.6)
HGB: 14 g/dL (ref 11.6–15.9)
LYMPH#: 0.9 10*3/uL (ref 0.9–3.3)
LYMPH%: 10.1 % — ABNORMAL LOW (ref 14.0–48.0)
MCH: 32.5 pg (ref 26.0–34.0)
MCHC: 34.3 g/dL (ref 32.0–36.0)
MCV: 95 fL (ref 81–101)
MONO#: 1.1 10*3/uL — ABNORMAL HIGH (ref 0.1–0.9)
MONO%: 11.6 % (ref 0.0–13.0)
NEUT#: 7.2 10*3/uL — ABNORMAL HIGH (ref 1.5–6.5)
NEUT%: 77.3 % (ref 39.6–80.0)
Platelets: 275 10*3/uL (ref 145–400)
RBC: 4.31 10*6/uL (ref 3.70–5.32)
RDW: 13 % (ref 11.1–15.7)
WBC: 9.3 10*3/uL (ref 3.9–10.0)

## 2017-06-18 LAB — CMP (CANCER CENTER ONLY)
ALT(SGPT): 24 U/L (ref 10–47)
AST: 17 U/L (ref 11–38)
Albumin: 3.6 g/dL (ref 3.3–5.5)
Alkaline Phosphatase: 83 U/L (ref 26–84)
BILIRUBIN TOTAL: 0.7 mg/dL (ref 0.20–1.60)
BUN, Bld: 28 mg/dL — ABNORMAL HIGH (ref 7–22)
CALCIUM: 9.2 mg/dL (ref 8.0–10.3)
CHLORIDE: 107 meq/L (ref 98–108)
CO2: 28 meq/L (ref 18–33)
Creat: 0.9 mg/dl (ref 0.6–1.2)
GLUCOSE: 86 mg/dL (ref 73–118)
POTASSIUM: 3.3 meq/L (ref 3.3–4.7)
Sodium: 145 mEq/L (ref 128–145)
Total Protein: 6.3 g/dL — ABNORMAL LOW (ref 6.4–8.1)

## 2017-06-18 MED ORDER — BORTEZOMIB CHEMO SQ INJECTION 3.5 MG (2.5MG/ML)
1.3000 mg/m2 | Freq: Once | INTRAMUSCULAR | Status: AC
  Administered 2017-06-18: 2 mg via SUBCUTANEOUS
  Filled 2017-06-18: qty 2

## 2017-06-18 MED ORDER — PROCHLORPERAZINE MALEATE 10 MG PO TABS: 10.0000 mg | ORAL_TABLET | Freq: Once | ORAL | Status: DC

## 2017-06-18 NOTE — Patient Instructions (Signed)
[facility] Discharge Instructions for Patients Receiving Chemotherapy  Today you received the following chemotherapy agents Velcade To help prevent nausea and vomiting after your treatment, we encourage you to take your nausea medication as prescribed.   If you develop nausea and vomiting that is not controlled by your nausea medication, call the clinic.   BELOW ARE SYMPTOMS THAT SHOULD BE REPORTED IMMEDIATELY:  *FEVER GREATER THAN 100.5 F  *CHILLS WITH OR WITHOUT FEVER  NAUSEA AND VOMITING THAT IS NOT CONTROLLED WITH YOUR NAUSEA MEDICATION  *UNUSUAL SHORTNESS OF BREATH  *UNUSUAL BRUISING OR BLEEDING  TENDERNESS IN MOUTH AND THROAT WITH OR WITHOUT PRESENCE OF ULCERS  *URINARY PROBLEMS  *BOWEL PROBLEMS  UNUSUAL RASH Items with * indicate a potential emergency and should be followed up as soon as possible.  Feel free to call the clinic should you have any questions or concerns. The clinic phone number is [phone].  Please show the CHEMO ALERT CARD at check-in to the Emergency Department and triage nurse.   

## 2017-06-19 LAB — KAPPA/LAMBDA LIGHT CHAINS
IG LAMBDA FREE LIGHT CHAIN: 5.6 mg/L — AB (ref 5.7–26.3)
Ig Kappa Free Light Chain: 4.4 mg/L (ref 3.3–19.4)
KAPPA/LAMBDA FLC RATIO: 0.79 (ref 0.26–1.65)

## 2017-06-19 LAB — IGG, IGA, IGM
IGA/IMMUNOGLOBULIN A, SERUM: 18 mg/dL — AB (ref 64–422)
IgG, Qn, Serum: 325 mg/dL — ABNORMAL LOW (ref 700–1600)
IgM, Qn, Serum: 16 mg/dL — ABNORMAL LOW (ref 26–217)

## 2017-06-19 NOTE — Progress Notes (Signed)
Hematology and Oncology Follow Up Visit  Debra Ruiz [id number] 03/09/1946 71 y.o. 06/19/2017   Principle Diagnosis:  IgG Kappa myeloma - compression fractures - 13q-, +11, 14q+ - hyperdiploid  Completed Therapy: S/p Kyphoplasty of T11 and T12 XRT to lower spine-completed on 09/25/2016  Current Therapy:   Velcade/Decadron -status post cycle 8 - Revlimid stopped on 11/06/2016 Xgeva 126m Sq q  3 month - next dose in December 2018   Interim History:  Debra Ruiz here today for follow-up.  She is doing quite well.  She did have a very nice Thanksgiving.  She was wondering if she might be able to get onto an oral agent for her myeloma.  I told her that if she wanted to switch, I would probably would get her on Revlimid.  She had been on Revlimid before.  Again she is responded quite nicely.  Her myeloma studies have improved dramatically.  Back in October, her M spike was 0.1 g/dL.  Her IgG level was 325 mg/dL.  Her lambda light chain was 0.3 mg/dL.  She is exercising.  She is walking.  I think she still doing some physical therapy.  She has had no problems with headache.  She has had no issues with dysphagia or odynophagia.  She has had no change in bowel or bladder habits.  She has had no cough or shortness of breath.  She has had no rashes.  She said no leg swelling.  Overall, her performance status is ECOG 1.  Medications:  Allergies as of 06/18/2017      Reactions   Crestor [rosuvastatin Calcium]    Causes liver functions to be elevated      Medication List        Accurate as of 06/18/17 11:59 PM. Always use your most recent med list.          ALPRAZolam 0.25 MG tablet Commonly known as:  XANAX Take 1 tablet (0.25 mg total) by mouth 2 (two) times daily as needed for anxiety.   amLODipine 5 MG tablet Commonly known as:  NORVASC TAKE 1 TABLET(5 MG) BY MOUTH DAILY   aspirin EC 81 MG tablet Take 81 mg by mouth daily.   atorvastatin 20 MG tablet Commonly  known as:  LIPITOR TAKE 1 TABLET(20 MG) BY MOUTH DAILY   Baclofen 5 MG Tabs TK 1 T PO TID PRN   BYSTOLIC 5 MG tablet Generic drug:  nebivolol TAKE 1 TABLET(5 MG) BY MOUTH DAILY   cholecalciferol 1000 units tablet Commonly known as:  VITAMIN D Take 2,000 Units by mouth daily.   dexamethasone 4 MG tablet Commonly known as:  DECADRON Take 5 pills with food once a week   dronabinol 2.5 MG capsule Commonly known as:  MARINOL Take 1 capsule (2.5 mg total) by mouth 2 (two) times daily before lunch and supper.   escitalopram 20 MG tablet Commonly known as:  LEXAPRO Take 1 tablet (20 mg total) by mouth daily.   famciclovir 250 MG tablet Commonly known as:  FAMVIR Take 1 tablet (250 mg total) by mouth daily.   Melatonin 10 MG Tabs Take 2 tablets by mouth daily.   meloxicam 15 MG tablet Commonly known as:  MOBIC TK 1 T PO QD WF PRN   ofloxacin 0.3 % ophthalmic solution Commonly known as:  OCUFLOX Place 1 drop into both eyes 4 (four) times daily.   potassium chloride SA 20 MEQ tablet Commonly known as:  K-DUR,KLOR-CON TAKE 1 TABLET(20 MEQ) BY  MOUTH DAILY   prochlorperazine 10 MG tablet Commonly known as:  COMPAZINE TAKE 1 TABLET(10 MG) BY MOUTH EVERY 6 HOURS AS NEEDED FOR NAUSEA OR VOMITING       Allergies:  Allergies  Allergen Reactions  . Crestor [Rosuvastatin Calcium]     Causes liver functions to be elevated    Past Medical History, Surgical history, Social history, and Family History were reviewed and updated.  Review of Systems: As stated in the interim history December  Physical Exam:  weight is 114 lb (51.7 kg). Her oral temperature is 98.1 F (36.7 C). Her blood pressure is 118/48 (abnormal) and her pulse is 82. Her respiration is 16 and oxygen saturation is 99%.   Wt Readings from Last 3 Encounters:  06/18/17 114 lb (51.7 kg)  05/16/17 116 lb (52.6 kg)  05/06/17 115 lb (52.2 kg)    Physical Exam  Constitutional: She is oriented to person,  place, and time.  HENT:  Head: Normocephalic and atraumatic.  Mouth/Throat: Oropharynx is clear and moist.  Eyes: EOM are normal. Pupils are equal, round, and reactive to light.  Neck: Normal range of motion.  Cardiovascular: Normal rate, regular rhythm and normal heart sounds.  Pulmonary/Chest: Effort normal and breath sounds normal.  Abdominal: Soft. Bowel sounds are normal.  Musculoskeletal: Normal range of motion. She exhibits no edema, tenderness or deformity.  Lymphadenopathy:    She has no cervical adenopathy.  Neurological: She is alert and oriented to person, place, and time.  Skin: Skin is warm and dry. No rash noted. No erythema.  Psychiatric: She has a normal mood and affect. Her behavior is normal. Judgment and thought content normal.  Vitals reviewed.     Lab Results  Component Value Date   WBC 9.3 06/18/2017   HGB 14.0 06/18/2017   HCT 40.8 06/18/2017   MCV 95 06/18/2017   PLT 275 06/18/2017   No results found for: FERRITIN, IRON, TIBC, UIBC, IRONPCTSAT Lab Results  Component Value Date   RBC 4.31 06/18/2017   Lab Results  Component Value Date   KAPLAMBRATIO 0.79 06/18/2017   Lab Results  Component Value Date   IGGSERUM 325 (L) 06/18/2017   IGMSERUM 16 (L) 06/18/2017   Lab Results  Component Value Date   MSPIKE 0.1 (H) 04/23/2017     Chemistry      Component Value Date/Time   NA 145 06/18/2017 1138   NA 143 03/05/2017 1051   K 3.3 06/18/2017 1138   K 3.7 03/05/2017 1051   CL 107 06/18/2017 1138   CO2 28 06/18/2017 1138   CO2 25 03/05/2017 1051   BUN 28 (H) 06/18/2017 1138   BUN 18.2 03/05/2017 1051   CREATININE 0.9 06/18/2017 1138   CREATININE 0.8 03/05/2017 1051      Component Value Date/Time   CALCIUM 9.2 06/18/2017 1138   CALCIUM 8.9 03/05/2017 1051   ALKPHOS 83 06/18/2017 1138   ALKPHOS 67 03/05/2017 1051   AST 17 06/18/2017 1138   AST 15 03/05/2017 1051   ALT 24 06/18/2017 1138   ALT 17 03/05/2017 1051   BILITOT 0.70 06/18/2017  1138   BILITOT 0.55 03/05/2017 1051      Impression and Plan: Debra Ruiz is a very pleasant 71 yo caucasian female with IgG Kappa Myeloma. She has done well with Velcade and has no complaints at this time. Her counts have remained stable.   She really would like to get off the Velcade.  I told her that if she  were to stop the Velcade, I would put her on Revlimid.  She was initially on Revlimid when we first started treating her.  I think there may have been some cost issues with this.  I think that maintenance Revlimid would be a very good idea for her.  She is not a transplant candidate.  We will have her come back in 1 month for her final Velcade.  I will then see about getting Revlimid all arranged for her.    We will see about giving her a dose of Xgeva when she comes back.    [name], MD 11/29/20185:54 PM

## 2017-06-20 ENCOUNTER — Telehealth: Payer: Self-pay | Admitting: *Deleted

## 2017-06-20 LAB — PROTEIN ELECTROPHORESIS, SERUM
A/G RATIO SPE: 1.6 (ref 0.7–1.7)
Albumin: 3.5 g/dL (ref 2.9–4.4)
Alpha 1: 0.2 g/dL (ref 0.0–0.4)
Alpha 2: 0.8 g/dL (ref 0.4–1.0)
BETA: 0.9 g/dL (ref 0.7–1.3)
GAMMA GLOBULIN: 0.3 g/dL — AB (ref 0.4–1.8)
GLOBULIN, TOTAL: 2.2 g/dL (ref 2.2–3.9)
M-Spike, %: 0.1 g/dL — ABNORMAL HIGH
Total Protein: 5.7 g/dL — ABNORMAL LOW (ref 6.0–8.5)

## 2017-06-20 NOTE — Telephone Encounter (Signed)
When patient was in the office this past Wednesday, Dr Marin Olp discussed restarting revlimid. She states today, she's been thinking about it, but wants to take more time before making a decision about whether or not to restart. She'd like a "few more months".   Patient has her next appointment with Dr Marin Olp on 08/13/17. Recommended to patient that she continue to think about it, writing down all her questions or concerns, and that at that appointment she can review all her questions & concerns with MD and make a decision. She is agreeable to that plan.  Dr Marin Olp notified.

## 2017-07-16 ENCOUNTER — Other Ambulatory Visit (HOSPITAL_BASED_OUTPATIENT_CLINIC_OR_DEPARTMENT_OTHER): Payer: PPO

## 2017-07-16 ENCOUNTER — Ambulatory Visit (HOSPITAL_BASED_OUTPATIENT_CLINIC_OR_DEPARTMENT_OTHER): Payer: PPO

## 2017-07-16 VITALS — BP 104/63 | HR 70 | Temp 97.8°F | Resp 20

## 2017-07-16 DIAGNOSIS — Z5112 Encounter for antineoplastic immunotherapy: Secondary | ICD-10-CM

## 2017-07-16 DIAGNOSIS — C9000 Multiple myeloma not having achieved remission: Secondary | ICD-10-CM

## 2017-07-16 LAB — CBC WITH DIFFERENTIAL (CANCER CENTER ONLY)
BASO#: 0 10*3/uL (ref 0.0–0.2)
BASO%: 0.2 % (ref 0.0–2.0)
EOS%: 0.7 % (ref 0.0–7.0)
Eosinophils Absolute: 0.1 10*3/uL (ref 0.0–0.5)
HEMATOCRIT: 41.2 % (ref 34.8–46.6)
HEMOGLOBIN: 13.9 g/dL (ref 11.6–15.9)
LYMPH#: 1.1 10*3/uL (ref 0.9–3.3)
LYMPH%: 12.1 % — ABNORMAL LOW (ref 14.0–48.0)
MCH: 31.9 pg (ref 26.0–34.0)
MCHC: 33.7 g/dL (ref 32.0–36.0)
MCV: 95 fL (ref 81–101)
MONO#: 0.7 10*3/uL (ref 0.1–0.9)
MONO%: 8 % (ref 0.0–13.0)
NEUT%: 79 % (ref 39.6–80.0)
NEUTROS ABS: 7 10*3/uL — AB (ref 1.5–6.5)
Platelets: 254 10*3/uL (ref 145–400)
RBC: 4.36 10*6/uL (ref 3.70–5.32)
RDW: 12.8 % (ref 11.1–15.7)
WBC: 8.9 10*3/uL (ref 3.9–10.0)

## 2017-07-16 LAB — CMP (CANCER CENTER ONLY)
ALBUMIN: 3.6 g/dL (ref 3.3–5.5)
ALK PHOS: 71 U/L (ref 26–84)
ALT: 23 U/L (ref 10–47)
AST: 20 U/L (ref 11–38)
BILIRUBIN TOTAL: 0.8 mg/dL (ref 0.20–1.60)
BUN, Bld: 23 mg/dL — ABNORMAL HIGH (ref 7–22)
CALCIUM: 9.7 mg/dL (ref 8.0–10.3)
CO2: 29 mEq/L (ref 18–33)
Chloride: 106 mEq/L (ref 98–108)
Creat: 0.8 mg/dl (ref 0.6–1.2)
Glucose, Bld: 133 mg/dL — ABNORMAL HIGH (ref 73–118)
Potassium: 3.8 mEq/L (ref 3.3–4.7)
Sodium: 148 mEq/L — ABNORMAL HIGH (ref 128–145)
Total Protein: 6.2 g/dL — ABNORMAL LOW (ref 6.4–8.1)

## 2017-07-16 MED ORDER — PROCHLORPERAZINE MALEATE 10 MG PO TABS
ORAL_TABLET | ORAL | Status: AC
  Filled 2017-07-16: qty 1

## 2017-07-16 MED ORDER — BORTEZOMIB CHEMO SQ INJECTION 3.5 MG (2.5MG/ML)
1.3000 mg/m2 | Freq: Once | INTRAMUSCULAR | Status: AC
Start: 2017-07-16 — End: 2017-07-16
  Administered 2017-07-16: 2 mg via SUBCUTANEOUS
  Filled 2017-07-16: qty 2

## 2017-07-16 MED ORDER — PROCHLORPERAZINE MALEATE 10 MG PO TABS
10.0000 mg | ORAL_TABLET | Freq: Once | ORAL | Status: AC
  Administered 2017-07-16: 10 mg via ORAL

## 2017-07-16 NOTE — Patient Instructions (Signed)
[facility] Discharge Instructions for Patients Receiving Chemotherapy  Today you received the following chemotherapy agents:  Velcade  To help prevent nausea and vomiting after your treatment, we encourage you to take your nausea medication as ordered per MD.    If you develop nausea and vomiting that is not controlled by your nausea medication, call the clinic.   BELOW ARE SYMPTOMS THAT SHOULD BE REPORTED IMMEDIATELY:  *FEVER GREATER THAN 100.5 F  *CHILLS WITH OR WITHOUT FEVER  NAUSEA AND VOMITING THAT IS NOT CONTROLLED WITH YOUR NAUSEA MEDICATION  *UNUSUAL SHORTNESS OF BREATH  *UNUSUAL BRUISING OR BLEEDING  TENDERNESS IN MOUTH AND THROAT WITH OR WITHOUT PRESENCE OF ULCERS  *URINARY PROBLEMS  *BOWEL PROBLEMS  UNUSUAL RASH Items with * indicate a potential emergency and should be followed up as soon as possible.  Feel free to call the clinic should you have any questions or concerns. The clinic phone number is [phone].  Please show the CHEMO ALERT CARD at check-in to the Emergency Department and triage nurse.   

## 2017-07-17 ENCOUNTER — Other Ambulatory Visit: Payer: Self-pay | Admitting: Internal Medicine

## 2017-07-17 NOTE — Telephone Encounter (Signed)
Refill x 6 months 

## 2017-07-18 NOTE — Telephone Encounter (Signed)
Phoned in to [facility] [place]

## 2017-08-13 ENCOUNTER — Inpatient Hospital Stay: Payer: PPO

## 2017-08-13 ENCOUNTER — Inpatient Hospital Stay: Payer: PPO | Attending: Hematology & Oncology | Admitting: Hematology & Oncology

## 2017-08-13 ENCOUNTER — Encounter: Payer: Self-pay | Admitting: Hematology & Oncology

## 2017-08-13 ENCOUNTER — Other Ambulatory Visit: Payer: Self-pay

## 2017-08-13 VITALS — BP 127/71 | HR 83 | Temp 98.5°F | Resp 16 | Wt 116.0 lb

## 2017-08-13 DIAGNOSIS — C9000 Multiple myeloma not having achieved remission: Secondary | ICD-10-CM | POA: Diagnosis not present

## 2017-08-13 DIAGNOSIS — Z5112 Encounter for antineoplastic immunotherapy: Secondary | ICD-10-CM | POA: Insufficient documentation

## 2017-08-13 LAB — CMP (CANCER CENTER ONLY)
ALK PHOS: 76 U/L (ref 26–84)
ALT: 20 U/L (ref 0–55)
ANION GAP: 11 (ref 5–15)
AST: 19 U/L (ref 5–34)
Albumin: 3.7 g/dL (ref 3.5–5.0)
BILIRUBIN TOTAL: 0.8 mg/dL (ref 0.2–1.2)
BUN: 24 mg/dL — AB (ref 7–22)
CO2: 28 mmol/L (ref 18–33)
Calcium: 9.7 mg/dL (ref 8.0–10.3)
Chloride: 106 mmol/L (ref 98–108)
Creatinine: 0.6 mg/dL (ref 0.60–1.10)
Glucose, Bld: 98 mg/dL (ref 73–118)
POTASSIUM: 3.3 mmol/L — AB (ref 3.5–5.1)
Sodium: 145 mmol/L (ref 128–145)
TOTAL PROTEIN: 6.4 g/dL (ref 6.4–8.1)

## 2017-08-13 LAB — CBC WITH DIFFERENTIAL (CANCER CENTER ONLY)
Basophils Absolute: 0 10*3/uL (ref 0.0–0.1)
Basophils Relative: 0 %
Eosinophils Absolute: 0.1 10*3/uL (ref 0.0–0.5)
Eosinophils Relative: 1 %
HEMATOCRIT: 38.9 % (ref 34.8–46.6)
HEMOGLOBIN: 13.1 g/dL (ref 11.6–15.9)
LYMPHS ABS: 1.1 10*3/uL (ref 0.9–3.3)
LYMPHS PCT: 13 %
MCH: 32.4 pg (ref 26.0–34.0)
MCHC: 33.7 g/dL (ref 32.0–36.0)
MCV: 96.3 fL (ref 81.0–101.0)
Monocytes Absolute: 0.9 10*3/uL (ref 0.1–0.9)
Monocytes Relative: 11 %
NEUTROS PCT: 75 %
Neutro Abs: 6 10*3/uL (ref 1.5–6.5)
Platelet Count: 243 10*3/uL (ref 145–400)
RBC: 4.04 MIL/uL (ref 3.70–5.32)
RDW: 13 % (ref 11.1–15.7)
WBC: 8.1 10*3/uL (ref 3.9–10.3)

## 2017-08-13 MED ORDER — DENOSUMAB 120 MG/1.7ML ~~LOC~~ SOLN
SUBCUTANEOUS | Status: AC
  Filled 2017-08-13: qty 1.7

## 2017-08-13 MED ORDER — PROCHLORPERAZINE MALEATE 10 MG PO TABS: 10.0000 mg | ORAL_TABLET | Freq: Once | ORAL | Status: DC

## 2017-08-13 MED ORDER — PROCHLORPERAZINE MALEATE 10 MG PO TABS
ORAL_TABLET | ORAL | Status: AC
  Filled 2017-08-13: qty 1

## 2017-08-13 MED ORDER — DENOSUMAB 120 MG/1.7ML ~~LOC~~ SOLN
120.0000 mg | Freq: Once | SUBCUTANEOUS | Status: AC
  Administered 2017-08-13: 120 mg via SUBCUTANEOUS

## 2017-08-13 MED ORDER — BORTEZOMIB CHEMO SQ INJECTION 3.5 MG (2.5MG/ML)
1.3000 mg/m2 | Freq: Once | INTRAMUSCULAR | Status: AC
  Administered 2017-08-13: 2 mg via SUBCUTANEOUS
  Filled 2017-08-13: qty 2

## 2017-08-13 NOTE — Progress Notes (Signed)
Hematology and Oncology Follow Up Visit  Debra Ruiz [id number] [birth year] 72 y.o. 08/13/2017   Principle Diagnosis:  IgG Kappa myeloma - compression fractures - 13q-, +11, 14q+ - hyperdiploid  Completed Therapy: S/p Kyphoplasty of T11 and T12 XRT to lower spine-completed on 09/25/2016  Current Therapy:   Velcade/Decadron -status post cycle 8 - Revlimid stopped on 11/06/2016 Xgeva 180m Sq q  3 month - next dose in April 2019   Interim History:  Ms. Debra Ruiz here today for follow-up.  She is doing okay although her back is really bothering her.  She comes up with a lot of excuses not to exercise.  I gave her several ideas that might help her out.  I really pushed for her to try water aerobics.  This would be good for her back.  I told her that she should to try massage therapy as this would help with any kind of muscle tightness.  She does see an orthopedic surgeon.  I told her that she can always go back to see him for any of his recommendations.  Her myeloma studies have looked pretty good.  Her last M spike back in late November showed a M spike of 0.1 g/dL.  Her IgG level is 325 mg/dL.  Her kappa light chain was 0.4 mg/dL.  Her appetite is doing okay.  She enjoyed the holidays.  [place]  Overall, her performance status is ECOG 1.  Medications:  Allergies as of 08/13/2017      Reactions   Crestor [rosuvastatin Calcium]    Causes liver functions to be elevated      Medication List        Accurate as of 08/13/17  2:42 PM. Always use your most recent med list.          ALPRAZolam 0.25 MG tablet Commonly known as:  XANAX TAKE 1 TABLET BY MOUTH TWICE DAILY AS NEEDED FOR ANXIETY   amLODipine 5 MG tablet Commonly known as:  NORVASC TAKE 1 TABLET(5 MG) BY MOUTH DAILY   aspirin EC 81 MG tablet Take 81 mg by mouth daily.   atorvastatin 20 MG tablet Commonly known as:  LIPITOR TAKE 1 TABLET(20 MG) BY MOUTH DAILY     Baclofen 5 MG Tabs TK 1 T PO TID PRN   BYSTOLIC 5 MG tablet Generic drug:  nebivolol TAKE 1 TABLET(5 MG) BY MOUTH DAILY   cholecalciferol 1000 units tablet Commonly known as:  VITAMIN D Take 2,000 Units by mouth daily.   dexamethasone 4 MG tablet Commonly known as:  DECADRON Take 5 pills with food once a week   escitalopram 20 MG tablet Commonly known as:  LEXAPRO Take 1 tablet (20 mg total) by mouth daily.   famciclovir 250 MG tablet Commonly known as:  FAMVIR Take 1 tablet (250 mg total) by mouth daily.   famotidine 10 MG tablet Commonly known as:  PEPCID Take 10 mg by mouth 2 (two) times daily.   Melatonin 10 MG Tabs Take 2 tablets by mouth daily.   meloxicam 15 MG tablet Commonly known as:  MOBIC TK 1 T PO QD WF PRN   potassium chloride SA 20 MEQ tablet Commonly known as:  K-DUR,KLOR-CON TAKE 1 TABLET(20 MEQ) BY MOUTH DAILY   PREVNAR 13 Susp injection Generic drug:  pneumococcal 13-valent conjugate vaccine ADM 0.5ML IM UTD   prochlorperazine 10 MG tablet Commonly known as:  COMPAZINE TAKE 1 TABLET(10 MG) BY  MOUTH EVERY 6 HOURS AS NEEDED FOR NAUSEA OR VOMITING   valACYclovir 1000 MG tablet Commonly known as:  VALTREX Take by mouth.       Allergies:  Allergies  Allergen Reactions  . Crestor [Rosuvastatin Calcium]     Causes liver functions to be elevated    Past Medical History, Surgical history, Social history, and Family History were reviewed and updated.  Review of Systems: Review of Systems  Constitutional: Negative.   HENT: Negative.   Eyes: Negative.   Respiratory: Negative.   Cardiovascular: Negative.   Gastrointestinal: Negative.   Genitourinary: Negative.   Musculoskeletal: Positive for back pain.  Skin: Negative.   Neurological: Negative.   Endo/Heme/Allergies: Negative.   Psychiatric/Behavioral: Negative.     Physical Exam:  weight is 116 lb (52.6 kg). Her oral temperature is 98.5 F (36.9 C). Her blood pressure is 127/71  and her pulse is 83. Her respiration is 16 and oxygen saturation is 99%.   Wt Readings from Last 3 Encounters:  08/13/17 116 lb (52.6 kg)  06/18/17 114 lb (51.7 kg)  05/16/17 116 lb (52.6 kg)    Physical Exam  Constitutional: She is oriented to person, place, and time.  HENT:  Head: Normocephalic and atraumatic.  Mouth/Throat: Oropharynx is clear and moist.  Eyes: EOM are normal. Pupils are equal, round, and reactive to light.  Neck: Normal range of motion.  Cardiovascular: Normal rate, regular rhythm and normal heart sounds.  Pulmonary/Chest: Effort normal and breath sounds normal.  Abdominal: Soft. Bowel sounds are normal.  Musculoskeletal: Normal range of motion. She exhibits no edema, tenderness or deformity.  Lymphadenopathy:    She has no cervical adenopathy.  Neurological: She is alert and oriented to person, place, and time.  Skin: Skin is warm and dry. No rash noted. No erythema.  Psychiatric: She has a normal mood and affect. Her behavior is normal. Judgment and thought content normal.  Vitals reviewed.     Lab Results  Component Value Date   WBC 8.1 08/13/2017   HGB 13.9 07/16/2017   HCT 38.9 08/13/2017   MCV 96.3 08/13/2017   PLT 243 08/13/2017   No results found for: FERRITIN, IRON, TIBC, UIBC, IRONPCTSAT Lab Results  Component Value Date   RBC 4.04 08/13/2017   Lab Results  Component Value Date   KAPLAMBRATIO 0.79 06/18/2017   Lab Results  Component Value Date   IGGSERUM 325 (L) 06/18/2017   IGMSERUM 16 (L) 06/18/2017   Lab Results  Component Value Date   MSPIKE 0.1 (H) 06/18/2017     Chemistry      Component Value Date/Time   NA 145 08/13/2017 1313   NA 148 (H) 07/16/2017 1128   NA 143 03/05/2017 1051   K 3.3 (L) 08/13/2017 1313   K 3.8 07/16/2017 1128   K 3.7 03/05/2017 1051   CL 106 08/13/2017 1313   CL 106 07/16/2017 1128   CO2 28 08/13/2017 1313   CO2 29 07/16/2017 1128   CO2 25 03/05/2017 1051   BUN 24 (H) 08/13/2017 1313   BUN  23 (H) 07/16/2017 1128   BUN 18.2 03/05/2017 1051   CREATININE 0.8 07/16/2017 1128   CREATININE 0.8 03/05/2017 1051      Component Value Date/Time   CALCIUM 9.7 08/13/2017 1313   CALCIUM 9.7 07/16/2017 1128   CALCIUM 8.9 03/05/2017 1051   ALKPHOS 76 08/13/2017 1313   ALKPHOS 71 07/16/2017 1128   ALKPHOS 67 03/05/2017 1051   AST 19 08/13/2017 1313  AST 15 03/05/2017 1051   ALT 20 08/13/2017 1313   ALT 23 07/16/2017 1128   ALT 17 03/05/2017 1051   BILITOT 0.8 08/13/2017 1313   BILITOT 0.55 03/05/2017 1051      Impression and Plan: Ms. Overholt is a very pleasant 72 yo caucasian female with IgG Kappa Myeloma. She has done well with Velcade and has no complaints at this time. Her counts have remained stable.   I talked her at length about getting on Revlimid.  She does not want to go on Revlimid.  She is read to many things about side effects.  She was on Revlimid previously.  I told her that we could always decrease the Revlimid dose.  The other option would be to do try Pomalidomide.  I told her that at some point, her myeloma is not going to respond to the Velcade by itself.  At that point, we would have to think about trying something different.  I told her that if we got 5 more years out of of treating her, I would be surprised.  She understands this.  Somehow, she thought that she would be able to live a normal life.  I told her that was not possible because she is not a candidate for a bone marrow transplant.  I would like to see her back in 4 weeks.  [name], MD 1/23/20192:42 PM

## 2017-08-13 NOTE — Patient Instructions (Signed)
[facility] Discharge Instructions for Patients Receiving Chemotherapy  Today you received the following chemotherapy agents Velcade To help prevent nausea and vomiting after your treatment, we encourage you to take your nausea medication as prescribed.   If you develop nausea and vomiting that is not controlled by your nausea medication, call the clinic.   BELOW ARE SYMPTOMS THAT SHOULD BE REPORTED IMMEDIATELY:  *FEVER GREATER THAN 100.5 F  *CHILLS WITH OR WITHOUT FEVER  NAUSEA AND VOMITING THAT IS NOT CONTROLLED WITH YOUR NAUSEA MEDICATION  *UNUSUAL SHORTNESS OF BREATH  *UNUSUAL BRUISING OR BLEEDING  TENDERNESS IN MOUTH AND THROAT WITH OR WITHOUT PRESENCE OF ULCERS  *URINARY PROBLEMS  *BOWEL PROBLEMS  UNUSUAL RASH Items with * indicate a potential emergency and should be followed up as soon as possible.  Feel free to call the clinic should you have any questions or concerns. The clinic phone number is [phone].  Please show the CHEMO ALERT CARD at check-in to the Emergency Department and triage nurse.   

## 2017-08-14 LAB — IGG, IGA, IGM
IGA: 14 mg/dL — AB (ref 64–422)
IGM (IMMUNOGLOBULIN M), SRM: 20 mg/dL — AB (ref 26–217)
IgG (Immunoglobin G), Serum: 277 mg/dL — ABNORMAL LOW (ref 700–1600)

## 2017-08-14 LAB — KAPPA/LAMBDA LIGHT CHAINS
KAPPA FREE LGHT CHN: 5.1 mg/L (ref 3.3–19.4)
KAPPA, LAMDA LIGHT CHAIN RATIO: 1.82 — AB (ref 0.26–1.65)
Lambda free light chains: 2.8 mg/L — ABNORMAL LOW (ref 5.7–26.3)

## 2017-08-15 LAB — PROTEIN ELECTROPHORESIS, SERUM, WITH REFLEX
A/G RATIO SPE: 1.5 (ref 0.7–1.7)
ALBUMIN ELP: 3.5 g/dL (ref 2.9–4.4)
Alpha-1-Globulin: 0.2 g/dL (ref 0.0–0.4)
Alpha-2-Globulin: 0.8 g/dL (ref 0.4–1.0)
Beta Globulin: 1 g/dL (ref 0.7–1.3)
GLOBULIN, TOTAL: 2.3 g/dL (ref 2.2–3.9)
Gamma Globulin: 0.3 g/dL — ABNORMAL LOW (ref 0.4–1.8)
Total Protein ELP: 5.8 g/dL — ABNORMAL LOW (ref 6.0–8.5)

## 2017-08-18 ENCOUNTER — Telehealth: Payer: Self-pay | Admitting: *Deleted

## 2017-08-18 NOTE — Telephone Encounter (Addendum)
Patient is aware of results  ----- Message from [name], MD sent at 08/15/2017  5:06 PM EST ----- Call - NO myeloma noted in her blood! [name]

## 2017-09-02 ENCOUNTER — Other Ambulatory Visit: Payer: Self-pay | Admitting: Family

## 2017-09-02 DIAGNOSIS — C9000 Multiple myeloma not having achieved remission: Secondary | ICD-10-CM

## 2017-09-02 DIAGNOSIS — E782 Mixed hyperlipidemia: Secondary | ICD-10-CM

## 2017-09-05 ENCOUNTER — Other Ambulatory Visit: Payer: Self-pay

## 2017-09-05 DIAGNOSIS — C9000 Multiple myeloma not having achieved remission: Secondary | ICD-10-CM

## 2017-09-05 DIAGNOSIS — E782 Mixed hyperlipidemia: Secondary | ICD-10-CM

## 2017-09-05 MED ORDER — ATORVASTATIN CALCIUM 20 MG PO TABS: ORAL_TABLET | ORAL | 0 refills | Status: DC

## 2017-09-08 DIAGNOSIS — H1789 Other corneal scars and opacities: Secondary | ICD-10-CM | POA: Diagnosis not present

## 2017-09-08 DIAGNOSIS — H5203 Hypermetropia, bilateral: Secondary | ICD-10-CM | POA: Diagnosis not present

## 2017-09-08 DIAGNOSIS — H04123 Dry eye syndrome of bilateral lacrimal glands: Secondary | ICD-10-CM | POA: Diagnosis not present

## 2017-09-08 DIAGNOSIS — H25813 Combined forms of age-related cataract, bilateral: Secondary | ICD-10-CM | POA: Diagnosis not present

## 2017-09-09 DIAGNOSIS — H903 Sensorineural hearing loss, bilateral: Secondary | ICD-10-CM | POA: Diagnosis not present

## 2017-09-10 ENCOUNTER — Other Ambulatory Visit: Payer: Self-pay

## 2017-09-10 ENCOUNTER — Inpatient Hospital Stay: Payer: PPO

## 2017-09-10 ENCOUNTER — Inpatient Hospital Stay: Payer: PPO | Attending: Hematology & Oncology | Admitting: Hematology & Oncology

## 2017-09-10 ENCOUNTER — Encounter: Payer: Self-pay | Admitting: Hematology & Oncology

## 2017-09-10 VITALS — BP 130/70 | HR 74 | Temp 98.3°F | Resp 16 | Wt 116.8 lb

## 2017-09-10 DIAGNOSIS — Z5112 Encounter for antineoplastic immunotherapy: Secondary | ICD-10-CM | POA: Insufficient documentation

## 2017-09-10 DIAGNOSIS — C9000 Multiple myeloma not having achieved remission: Secondary | ICD-10-CM | POA: Insufficient documentation

## 2017-09-10 LAB — CBC WITH DIFFERENTIAL (CANCER CENTER ONLY)
BASOS PCT: 0 %
Basophils Absolute: 0 10*3/uL (ref 0.0–0.1)
EOS ABS: 0.1 10*3/uL (ref 0.0–0.5)
EOS PCT: 1 %
HCT: 40.7 % (ref 34.8–46.6)
HEMOGLOBIN: 13.6 g/dL (ref 11.6–15.9)
Lymphocytes Relative: 17 %
Lymphs Abs: 1.3 10*3/uL (ref 0.9–3.3)
MCH: 32.4 pg (ref 26.0–34.0)
MCHC: 33.4 g/dL (ref 32.0–36.0)
MCV: 96.9 fL (ref 81.0–101.0)
Monocytes Absolute: 1.1 10*3/uL — ABNORMAL HIGH (ref 0.1–0.9)
Monocytes Relative: 15 %
NEUTROS PCT: 67 %
Neutro Abs: 4.9 10*3/uL (ref 1.5–6.5)
PLATELETS: 244 10*3/uL (ref 145–400)
RBC: 4.2 MIL/uL (ref 3.70–5.32)
RDW: 13.5 % (ref 11.1–15.7)
WBC: 7.4 10*3/uL (ref 3.9–10.0)

## 2017-09-10 LAB — CMP (CANCER CENTER ONLY)
ALBUMIN: 3.7 g/dL (ref 3.5–5.0)
ALK PHOS: 71 U/L (ref 26–84)
ALT: 20 U/L (ref 10–47)
AST: 19 U/L (ref 11–38)
Anion gap: 6 (ref 5–15)
BILIRUBIN TOTAL: 0.8 mg/dL (ref 0.2–1.6)
BUN: 20 mg/dL (ref 7–22)
CHLORIDE: 109 mmol/L — AB (ref 98–108)
CO2: 30 mmol/L (ref 18–33)
CREATININE: 0.9 mg/dL (ref 0.60–1.20)
Calcium: 9.5 mg/dL (ref 8.0–10.3)
Glucose, Bld: 105 mg/dL (ref 73–118)
Potassium: 4.4 mmol/L (ref 3.3–4.7)
SODIUM: 145 mmol/L (ref 128–145)
Total Protein: 6.3 g/dL — ABNORMAL LOW (ref 6.4–8.1)

## 2017-09-10 MED ORDER — PROCHLORPERAZINE MALEATE 10 MG PO TABS: 10.0000 mg | ORAL_TABLET | Freq: Once | ORAL | Status: DC

## 2017-09-10 MED ORDER — BORTEZOMIB CHEMO SQ INJECTION 3.5 MG (2.5MG/ML)
1.3000 mg/m2 | Freq: Once | INTRAMUSCULAR | Status: AC
  Administered 2017-09-10: 2 mg via SUBCUTANEOUS
  Filled 2017-09-10: qty 2

## 2017-09-10 NOTE — Patient Instructions (Addendum)
[facility] Discharge Instructions for Patients Receiving Chemotherapy  Today you received the following chemotherapy agents Velcade.   To help prevent nausea and vomiting after your treatment, we encourage you to take your nausea medication as prescribed.   If you develop nausea and vomiting that is not controlled by your nausea medication, call the clinic.   BELOW ARE SYMPTOMS THAT SHOULD BE REPORTED IMMEDIATELY:  *FEVER GREATER THAN 100.5 F  *CHILLS WITH OR WITHOUT FEVER  NAUSEA AND VOMITING THAT IS NOT CONTROLLED WITH YOUR NAUSEA MEDICATION  *UNUSUAL SHORTNESS OF BREATH  *UNUSUAL BRUISING OR BLEEDING  TENDERNESS IN MOUTH AND THROAT WITH OR WITHOUT PRESENCE OF ULCERS  *URINARY PROBLEMS  *BOWEL PROBLEMS  UNUSUAL RASH Items with * indicate a potential emergency and should be followed up as soon as possible.  Feel free to call the clinic should you have any questions or concerns. The clinic phone number is [phone].  Please show the CHEMO ALERT CARD at check-in to the Emergency Department and triage nurse.    Bortezomib injection What is this medicine? BORTEZOMIB (bor TEZ oh mib) is a medicine that targets proteins in cancer cells and stops the cancer cells from growing. It is used to treat multiple myeloma and mantle-cell lymphoma. This medicine may be used for other purposes; ask your health care provider or pharmacist if you have questions. COMMON BRAND NAME(S): Velcade What should I tell my health care provider before I take this medicine? They need to know if you have any of these conditions: -diabetes -heart disease -irregular heartbeat -liver disease -on hemodialysis -low blood counts, like low white blood cells, platelets, or hemoglobin -peripheral neuropathy -taking medicine for blood pressure -an unusual or allergic reaction to bortezomib, mannitol, boron, other medicines, foods, dyes, or preservatives -pregnant or trying to get  pregnant -breast-feeding How should I use this medicine? This medicine is for injection into a vein or for injection under the skin. It is given by a health care professional in a hospital or clinic setting. Talk to your pediatrician regarding the use of this medicine in children. Special care may be needed. Overdosage: If you think you have taken too much of this medicine contact a poison control center or emergency room at once. NOTE: This medicine is only for you. Do not share this medicine with others. What if I miss a dose? It is important not to miss your dose. Call your doctor or health care professional if you are unable to keep an appointment. What may interact with this medicine? This medicine may interact with the following medications: -ketoconazole -rifampin -ritonavir -St. John's Wort This list may not describe all possible interactions. Give your health care provider a list of all the medicines, herbs, non-prescription drugs, or dietary supplements you use. Also tell them if you smoke, drink alcohol, or use illegal drugs. Some items may interact with your medicine. What should I watch for while using this medicine? You may get drowsy or dizzy. Do not drive, use machinery, or do anything that needs mental alertness until you know how this medicine affects you. Do not stand or sit up quickly, especially if you are an older patient. This reduces the risk of dizzy or fainting spells. In some cases, you may be given additional medicines to help with side effects. Follow all directions for their use. Call your doctor or health care professional for advice if you get a fever, chills or sore throat, or other symptoms of a cold or flu.   treat yourself. This drug decreases your body's ability to fight infections. Try to avoid being around people who are sick. This medicine may increase your risk to bruise or bleed. Call your doctor or health care professional if you notice any unusual  bleeding. You may need blood work done while you are taking this medicine. In some patients, this medicine may cause a serious brain infection that may cause death. If you have any problems seeing, thinking, speaking, walking, or standing, tell your doctor right away. If you cannot reach your doctor, urgently seek other source of medical care. Check with your doctor or health care professional if you get an attack of severe diarrhea, nausea and vomiting, or if you sweat a lot. The loss of too much body fluid can make it dangerous for you to take this medicine. Do not become pregnant while taking this medicine or for at least 2 months after stopping it. Women should inform their doctor if they wish to become pregnant or think they might be pregnant. Men should not father a child while taking this medicine and for at least 2 months after stopping it. There is a potential for serious side effects to an unborn child. Talk to your health care professional or pharmacist for more information. Do not breast-feed an infant while taking this medicine or for 2 months after stopping it. This medicine may interfere with the ability to have a child. You should talk with your doctor or health care professional if you are concerned about your fertility. What side effects may I notice from receiving this medicine? Side effects that you should report to your doctor or health care professional as soon as possible: -allergic reactions like skin rash, itching or hives, swelling of the face, lips, or tongue -breathing problems -changes in hearing -changes in vision -fast, irregular heartbeat -feeling faint or lightheaded, falls -pain, tingling, numbness in the hands or feet -right upper belly pain -seizures -swelling of the ankles, feet, hands -unusual bleeding or bruising -unusually weak or tired -vomiting -yellowing of the eyes or skin Side effects that usually do not require medical attention (report to your  doctor or health care professional if they continue or are bothersome): -changes in emotions or moods -constipation -diarrhea -loss of appetite -headache -irritation at site where injected -nausea This list may not describe all possible side effects. Call your doctor for medical advice about side effects. You may report side effects to FDA at 1-800-FDA-1088. Where should I keep my medicine? This drug is given in a hospital or clinic and will not be stored at home. NOTE: This sheet is a summary. It may not cover all possible information. If you have questions about this medicine, talk to your doctor, pharmacist, or health care provider.  2018 Elsevier/Gold Standard (2016-06-06 15:53:51)   

## 2017-09-10 NOTE — Progress Notes (Signed)
Hematology and Oncology Follow Up Visit  SHAVON ASHMORE [id number] 07-19-1946 72 y.o. 09/10/2017   Principle Diagnosis:  IgG Kappa myeloma - compression fractures - 13q-, +11, 14q+ - hyperdiploid  Completed Therapy: S/p Kyphoplasty of T11 and T12 XRT to lower spine-completed on 09/25/2016  Current Therapy:   Velcade/Decadron -status post cycle 8 - Revlimid stopped on 11/06/2016 Xgeva '120mg'$  Sq q  3 month - next dose in April 2019   Interim History:  Ms. Swendsen is here today for follow-up.  She really is doing quite well.  She and her husband had a very nice Valentine's Day last week.  We last saw her, there is no monoclonal spike noted in her blood.  Her IgG level was 277 mg/dL.  Her kappa light chain was 0.5 mg/dL.  She is trying to walk more.  She is going to start going to the [employer].  There is no nausea or vomiting.  She has had no change in bowel or bladder habits.  She has had no bleeding.  There is been no fever.  She is had no cough or shortness of breath.  Overall, her performance status is ECOG 1.   Medications:  Allergies as of 09/10/2017      Reactions   Crestor [rosuvastatin Calcium]    Causes liver functions to be elevated      Medication List        Accurate as of 09/10/17  2:15 PM. Always use your most recent med list.          ALPRAZolam 0.25 MG tablet Commonly known as:  XANAX TAKE 1 TABLET BY MOUTH TWICE DAILY AS NEEDED FOR ANXIETY   amLODipine 5 MG tablet Commonly known as:  NORVASC TAKE 1 TABLET(5 MG) BY MOUTH DAILY   aspirin EC 81 MG tablet Take 81 mg by mouth daily.   atorvastatin 20 MG tablet Commonly known as:  LIPITOR TAKE 1 TABLET(20 MG) BY MOUTH DAILY   Baclofen 5 MG Tabs TK 1 T PO TID PRN   BYSTOLIC 5 MG tablet Generic drug:  nebivolol TAKE 1 TABLET(5 MG) BY MOUTH DAILY   cholecalciferol 1000 units tablet Commonly known as:  VITAMIN D Take 2,000 Units by mouth daily.   dexamethasone 4 MG tablet Commonly  known as:  DECADRON Take 5 pills with food once a week   escitalopram 20 MG tablet Commonly known as:  LEXAPRO Take 1 tablet (20 mg total) by mouth daily.   famciclovir 250 MG tablet Commonly known as:  FAMVIR Take 1 tablet (250 mg total) by mouth daily.   Melatonin 10 MG Tabs Take 2 tablets by mouth daily.   meloxicam 15 MG tablet Commonly known as:  MOBIC TK 1 T PO QD WF PRN   potassium chloride SA 20 MEQ tablet Commonly known as:  K-DUR,KLOR-CON TAKE 1 TABLET(20 MEQ) BY MOUTH DAILY   PREVNAR 13 Susp injection Generic drug:  pneumococcal 13-valent conjugate vaccine ADM 0.5ML IM UTD   prochlorperazine 10 MG tablet Commonly known as:  COMPAZINE TAKE 1 TABLET(10 MG) BY MOUTH EVERY 6 HOURS AS NEEDED FOR NAUSEA OR VOMITING   valACYclovir 1000 MG tablet Commonly known as:  VALTREX Take by mouth.       Allergies:  Allergies  Allergen Reactions  . Crestor [Rosuvastatin Calcium]     Causes liver functions to be elevated    Past Medical History, Surgical history, Social history, and Family History were reviewed and updated.  Review of Systems: Review  of Systems  Constitutional: Negative.   HENT: Negative.   Eyes: Negative.   Respiratory: Negative.   Cardiovascular: Negative.   Gastrointestinal: Negative.   Genitourinary: Negative.   Musculoskeletal: Positive for back pain.  Skin: Negative.   Neurological: Negative.   Endo/Heme/Allergies: Negative.   Psychiatric/Behavioral: Negative.     Physical Exam:  weight is 116 lb 12.8 oz (53 kg). Her oral temperature is 98.3 F (36.8 C). Her blood pressure is 130/70 and her pulse is 74. Her respiration is 16 and oxygen saturation is 98%.   Wt Readings from Last 3 Encounters:  09/10/17 116 lb 12.8 oz (53 kg)  08/13/17 116 lb (52.6 kg)  06/18/17 114 lb (51.7 kg)    Physical Exam  Constitutional: She is oriented to person, place, and time.  HENT:  Head: Normocephalic and atraumatic.  Mouth/Throat: Oropharynx is  clear and moist.  Eyes: EOM are normal. Pupils are equal, round, and reactive to light.  Neck: Normal range of motion.  Cardiovascular: Normal rate, regular rhythm and normal heart sounds.  Pulmonary/Chest: Effort normal and breath sounds normal.  Abdominal: Soft. Bowel sounds are normal.  Musculoskeletal: Normal range of motion. She exhibits no edema, tenderness or deformity.  Lymphadenopathy:    She has no cervical adenopathy.  Neurological: She is alert and oriented to person, place, and time.  Skin: Skin is warm and dry. No rash noted. No erythema.  Psychiatric: She has a normal mood and affect. Her behavior is normal. Judgment and thought content normal.  Vitals reviewed.     Lab Results  Component Value Date   WBC 7.4 09/10/2017   HGB 13.9 07/16/2017   HCT 40.7 09/10/2017   MCV 96.9 09/10/2017   PLT 244 09/10/2017   No results found for: FERRITIN, IRON, TIBC, UIBC, IRONPCTSAT Lab Results  Component Value Date   RBC 4.20 09/10/2017   Lab Results  Component Value Date   KPAFRELGTCHN 5.1 08/13/2017   LAMBDASER 2.8 (L) 08/13/2017   KAPLAMBRATIO 1.82 (H) 08/13/2017   Lab Results  Component Value Date   IGGSERUM 277 (L) 08/13/2017   IGA 14 (L) 08/13/2017   IGMSERUM 20 (L) 08/13/2017   Lab Results  Component Value Date   TOTALPROTELP 5.8 (L) 08/13/2017   ALBUMINELP 3.5 08/13/2017   A1GS 0.2 08/13/2017   A2GS 0.8 08/13/2017   BETS 1.0 08/13/2017   GAMS 0.3 (L) 08/13/2017   MSPIKE Not Observed 08/13/2017     Chemistry      Component Value Date/Time   NA 145 09/10/2017 1315   NA 148 (H) 07/16/2017 1128   NA 143 03/05/2017 1051   K 4.4 09/10/2017 1315   K 3.8 07/16/2017 1128   K 3.7 03/05/2017 1051   CL 109 (H) 09/10/2017 1315   CL 106 07/16/2017 1128   CO2 30 09/10/2017 1315   CO2 29 07/16/2017 1128   CO2 25 03/05/2017 1051   BUN 20 09/10/2017 1315   BUN 23 (H) 07/16/2017 1128   BUN 18.2 03/05/2017 1051   CREATININE 0.90 09/10/2017 1315   CREATININE  0.8 07/16/2017 1128   CREATININE 0.8 03/05/2017 1051      Component Value Date/Time   CALCIUM 9.5 09/10/2017 1315   CALCIUM 9.7 07/16/2017 1128   CALCIUM 8.9 03/05/2017 1051   ALKPHOS 71 09/10/2017 1315   ALKPHOS 71 07/16/2017 1128   ALKPHOS 67 03/05/2017 1051   AST 19 09/10/2017 1315   AST 15 03/05/2017 1051   ALT 20 09/10/2017 1315  ALT 23 07/16/2017 1128   ALT 17 03/05/2017 1051   BILITOT 0.8 09/10/2017 1315   BILITOT 0.55 03/05/2017 1051      Impression and Plan: Ms. Studer is a very pleasant 72 yo caucasian female with IgG Kappa Myeloma. She has done well with Velcade and has no complaints at this time. Her counts have remained stable.   So far, everything is going quite well.  I do not see that we have to add Revlimid to her protocol.  We will go ahead and get her back in 1 more month.  [name], MD 2/20/20192:15 PM

## 2017-09-11 LAB — KAPPA/LAMBDA LIGHT CHAINS
Kappa free light chain: 3.6 mg/L (ref 3.3–19.4)
Kappa, lambda light chain ratio: 1.33 (ref 0.26–1.65)
Lambda free light chains: 2.7 mg/L — ABNORMAL LOW (ref 5.7–26.3)

## 2017-09-11 LAB — IGG, IGA, IGM
IGM (IMMUNOGLOBULIN M), SRM: 18 mg/dL — AB (ref 26–217)
IgA: 14 mg/dL — ABNORMAL LOW (ref 64–422)
IgG (Immunoglobin G), Serum: 295 mg/dL — ABNORMAL LOW (ref 700–1600)

## 2017-09-12 LAB — PROTEIN ELECTROPHORESIS, SERUM, WITH REFLEX
A/G Ratio: 1.9 — ABNORMAL HIGH (ref 0.7–1.7)
ALPHA-1-GLOBULIN: 0.2 g/dL (ref 0.0–0.4)
Albumin ELP: 3.9 g/dL (ref 2.9–4.4)
Alpha-2-Globulin: 0.7 g/dL (ref 0.4–1.0)
Beta Globulin: 0.8 g/dL (ref 0.7–1.3)
GAMMA GLOBULIN: 0.3 g/dL — AB (ref 0.4–1.8)
Globulin, Total: 2.1 g/dL — ABNORMAL LOW (ref 2.2–3.9)
TOTAL PROTEIN ELP: 6 g/dL (ref 6.0–8.5)

## 2017-09-15 ENCOUNTER — Telehealth: Payer: Self-pay | Admitting: *Deleted

## 2017-09-15 NOTE — Telephone Encounter (Signed)
-----   Message from [name], MD sent at 09/12/2017  3:43 PM EST ----- Call - NO myeloma in the blood!!  [name]

## 2017-09-21 ENCOUNTER — Other Ambulatory Visit: Payer: Self-pay | Admitting: Hematology & Oncology

## 2017-09-21 DIAGNOSIS — C9000 Multiple myeloma not having achieved remission: Secondary | ICD-10-CM

## 2017-10-06 DIAGNOSIS — H903 Sensorineural hearing loss, bilateral: Secondary | ICD-10-CM | POA: Diagnosis not present

## 2017-10-07 ENCOUNTER — Other Ambulatory Visit: Payer: Self-pay | Admitting: *Deleted

## 2017-10-08 ENCOUNTER — Encounter: Payer: Self-pay | Admitting: Hematology & Oncology

## 2017-10-08 ENCOUNTER — Inpatient Hospital Stay: Payer: PPO

## 2017-10-08 ENCOUNTER — Inpatient Hospital Stay: Payer: PPO | Attending: Hematology & Oncology | Admitting: Hematology & Oncology

## 2017-10-08 ENCOUNTER — Other Ambulatory Visit: Payer: Self-pay

## 2017-10-08 VITALS — BP 113/60 | HR 70 | Temp 98.6°F | Resp 20 | Wt 115.4 lb

## 2017-10-08 DIAGNOSIS — C9000 Multiple myeloma not having achieved remission: Secondary | ICD-10-CM | POA: Insufficient documentation

## 2017-10-08 DIAGNOSIS — Z5112 Encounter for antineoplastic immunotherapy: Secondary | ICD-10-CM | POA: Insufficient documentation

## 2017-10-08 LAB — CMP (CANCER CENTER ONLY)
ALK PHOS: 69 U/L (ref 26–84)
ALT: 25 U/L (ref 10–47)
AST: 26 U/L (ref 11–38)
Albumin: 3.9 g/dL (ref 3.5–5.0)
Anion gap: 8 (ref 5–15)
BUN: 15 mg/dL (ref 7–22)
CALCIUM: 9.3 mg/dL (ref 8.0–10.3)
CO2: 32 mmol/L (ref 18–33)
Chloride: 103 mmol/L (ref 98–108)
Creatinine: 1 mg/dL (ref 0.60–1.20)
GLUCOSE: 139 mg/dL — AB (ref 73–118)
Potassium: 4.1 mmol/L (ref 3.3–4.7)
SODIUM: 143 mmol/L (ref 128–145)
TOTAL PROTEIN: 6.3 g/dL — AB (ref 6.4–8.1)
Total Bilirubin: 1 mg/dL (ref 0.2–1.6)

## 2017-10-08 LAB — CBC WITH DIFFERENTIAL (CANCER CENTER ONLY)
BASOS ABS: 0 10*3/uL (ref 0.0–0.1)
Basophils Relative: 0 %
EOS ABS: 0 10*3/uL (ref 0.0–0.5)
EOS PCT: 1 %
HCT: 42.4 % (ref 34.8–46.6)
HEMOGLOBIN: 14.3 g/dL (ref 11.6–15.9)
LYMPHS ABS: 1.2 10*3/uL (ref 0.9–3.3)
LYMPHS PCT: 18 %
MCH: 32.4 pg (ref 26.0–34.0)
MCHC: 33.7 g/dL (ref 32.0–36.0)
MCV: 96.1 fL (ref 81.0–101.0)
Monocytes Absolute: 0.8 10*3/uL (ref 0.1–0.9)
Monocytes Relative: 12 %
NEUTROS PCT: 69 %
Neutro Abs: 4.5 10*3/uL (ref 1.5–6.5)
PLATELETS: 242 10*3/uL (ref 145–400)
RBC: 4.41 MIL/uL (ref 3.70–5.32)
RDW: 12.8 % (ref 11.1–15.7)
WBC: 6.5 10*3/uL (ref 3.9–10.0)

## 2017-10-08 MED ORDER — BORTEZOMIB CHEMO SQ INJECTION 3.5 MG (2.5MG/ML)
1.3000 mg/m2 | Freq: Once | INTRAMUSCULAR | Status: AC
  Administered 2017-10-08: 2 mg via SUBCUTANEOUS
  Filled 2017-10-08: qty 2

## 2017-10-08 MED ORDER — PROCHLORPERAZINE MALEATE 10 MG PO TABS: 10.0000 mg | ORAL_TABLET | Freq: Once | ORAL | Status: DC

## 2017-10-08 MED ORDER — PROCHLORPERAZINE MALEATE 10 MG PO TABS
ORAL_TABLET | ORAL | Status: AC
  Filled 2017-10-08: qty 1

## 2017-10-08 NOTE — Patient Instructions (Signed)
[facility] Discharge Instructions for Patients Receiving Chemotherapy  Today you received the following chemotherapy agents:  Velcade  To help prevent nausea and vomiting after your treatment, we encourage you to take your nausea medication as ordered per MD.    If you develop nausea and vomiting that is not controlled by your nausea medication, call the clinic.   BELOW ARE SYMPTOMS THAT SHOULD BE REPORTED IMMEDIATELY:  *FEVER GREATER THAN 100.5 F  *CHILLS WITH OR WITHOUT FEVER  NAUSEA AND VOMITING THAT IS NOT CONTROLLED WITH YOUR NAUSEA MEDICATION  *UNUSUAL SHORTNESS OF BREATH  *UNUSUAL BRUISING OR BLEEDING  TENDERNESS IN MOUTH AND THROAT WITH OR WITHOUT PRESENCE OF ULCERS  *URINARY PROBLEMS  *BOWEL PROBLEMS  UNUSUAL RASH Items with * indicate a potential emergency and should be followed up as soon as possible.  Feel free to call the clinic should you have any questions or concerns. The clinic phone number is [phone].  Please show the CHEMO ALERT CARD at check-in to the Emergency Department and triage nurse.   

## 2017-10-08 NOTE — Progress Notes (Signed)
Hematology and Oncology Follow Up Visit  Debra Ruiz [id number] 1946-06-28 72 y.o. 10/08/2017   Principle Diagnosis:  IgG Kappa myeloma - compression fractures - 13q-, +11, 14q+ - hyperdiploid  Completed Therapy: S/p Kyphoplasty of T11 and T12 XRT to lower spine-completed on 09/25/2016  Current Therapy:   Velcade/Decadron -status post cycle 8 - Revlimid stopped on 11/06/2016 Xgeva 192m Sq q  3 month - next dose in April 2019   Interim History:  Debra Ruiz here today for follow-up.  She really is doing quite well.  Every time I see her, she continues to look better and better.  She is more active.  She is able to do more.  Her back is not bothering her as much.  When we last saw her, she had no monoclonal spike in her blood.  Her IgG level was 300 mg/dL.  Her kappa light chain was 0.4 mg/dL.  She has had no fever.  She has had no change in bowel or bladder habits.  There is been no cough.  She has had no rashes.  There has been no leg swelling.  Overall, her performance status is ECOG 1.     Medications:  Allergies as of 10/08/2017      Reactions   Crestor [rosuvastatin Calcium]    Causes liver functions to be elevated      Medication List        Accurate as of 10/08/17  1:55 PM. Always use your most recent med list.          ALPRAZolam 0.25 MG tablet Commonly known as:  XANAX TAKE 1 TABLET BY MOUTH TWICE DAILY AS NEEDED FOR ANXIETY   amLODipine 5 MG tablet Commonly known as:  NORVASC TAKE 1 TABLET(5 MG) BY MOUTH DAILY   aspirin EC 81 MG tablet Take 81 mg by mouth every other day.   atorvastatin 20 MG tablet Commonly known as:  LIPITOR TAKE 1 TABLET(20 MG) BY MOUTH DAILY   Baclofen 5 MG Tabs TK 1 T PO TID PRN   BYSTOLIC 5 MG tablet Generic drug:  nebivolol TAKE 1 TABLET(5 MG) BY MOUTH DAILY   cholecalciferol 1000 units tablet Commonly known as:  VITAMIN D Take 2,000 Units by mouth daily.   dexamethasone 4 MG tablet Commonly known as:   DECADRON Take 5 pills with food once a week   escitalopram 20 MG tablet Commonly known as:  LEXAPRO Take 1 tablet (20 mg total) by mouth daily.   famciclovir 250 MG tablet Commonly known as:  FAMVIR TAKE 1 TABLET(250 MG) BY MOUTH DAILY   Melatonin 10 MG Tabs Take 2 tablets by mouth daily.   meloxicam 15 MG tablet Commonly known as:  MOBIC TK 1 T PO QD WF PRN   potassium chloride SA 20 MEQ tablet Commonly known as:  K-DUR,KLOR-CON TAKE 1 TABLET(20 MEQ) BY MOUTH DAILY   PREVNAR 13 Susp injection Generic drug:  pneumococcal 13-valent conjugate vaccine ADM 0.5ML IM UTD   prochlorperazine 10 MG tablet Commonly known as:  COMPAZINE TAKE 1 TABLET(10 MG) BY MOUTH EVERY 6 HOURS AS NEEDED FOR NAUSEA OR VOMITING   valACYclovir 1000 MG tablet Commonly known as:  VALTREX Take by mouth.       Allergies:  Allergies  Allergen Reactions  . Crestor [Rosuvastatin Calcium]     Causes liver functions to be elevated    Past Medical History, Surgical history, Social history, and Family History were reviewed and updated.  Review of Systems: Review  of Systems  Constitutional: Negative.   HENT: Negative.   Eyes: Negative.   Respiratory: Negative.   Cardiovascular: Negative.   Gastrointestinal: Negative.   Genitourinary: Negative.   Musculoskeletal: Positive for back pain.  Skin: Negative.   Neurological: Negative.   Endo/Heme/Allergies: Negative.   Psychiatric/Behavioral: Negative.     Physical Exam:  weight is 115 lb 6.4 oz (52.3 kg). Her oral temperature is 98.6 F (37 C). Her blood pressure is 113/60 and her pulse is 70. Her respiration is 20 and oxygen saturation is 100%.   Wt Readings from Last 3 Encounters:  10/08/17 115 lb 6.4 oz (52.3 kg)  09/10/17 116 lb 12.8 oz (53 kg)  08/13/17 116 lb (52.6 kg)    Physical Exam  Constitutional: She is oriented to person, place, and time.  HENT:  Head: Normocephalic and atraumatic.  Mouth/Throat: Oropharynx is clear and  moist.  Eyes: EOM are normal. Pupils are equal, round, and reactive to light.  Neck: Normal range of motion.  Cardiovascular: Normal rate, regular rhythm and normal heart sounds.  Pulmonary/Chest: Effort normal and breath sounds normal.  Abdominal: Soft. Bowel sounds are normal.  Musculoskeletal: Normal range of motion. She exhibits no edema, tenderness or deformity.  Lymphadenopathy:    She has no cervical adenopathy.  Neurological: She is alert and oriented to person, place, and time.  Skin: Skin is warm and dry. No rash noted. No erythema.  Psychiatric: She has a normal mood and affect. Her behavior is normal. Judgment and thought content normal.  Vitals reviewed.     Lab Results  Component Value Date   WBC 6.5 10/08/2017   HGB 13.9 07/16/2017   HCT 42.4 10/08/2017   MCV 96.1 10/08/2017   PLT 242 10/08/2017   No results found for: FERRITIN, IRON, TIBC, UIBC, IRONPCTSAT Lab Results  Component Value Date   RBC 4.41 10/08/2017   Lab Results  Component Value Date   KPAFRELGTCHN 3.6 09/10/2017   LAMBDASER 2.7 (L) 09/10/2017   KAPLAMBRATIO 1.33 09/10/2017   Lab Results  Component Value Date   IGGSERUM 295 (L) 09/10/2017   IGA 14 (L) 09/10/2017   IGMSERUM 18 (L) 09/10/2017   Lab Results  Component Value Date   TOTALPROTELP 6.0 09/10/2017   ALBUMINELP 3.9 09/10/2017   A1GS 0.2 09/10/2017   A2GS 0.7 09/10/2017   BETS 0.8 09/10/2017   GAMS 0.3 (L) 09/10/2017   MSPIKE Not Observed 09/10/2017     Chemistry      Component Value Date/Time   NA 143 10/08/2017 1140   NA 148 (H) 07/16/2017 1128   NA 143 03/05/2017 1051   K 4.1 10/08/2017 1140   K 3.8 07/16/2017 1128   K 3.7 03/05/2017 1051   CL 103 10/08/2017 1140   CL 106 07/16/2017 1128   CO2 32 10/08/2017 1140   CO2 29 07/16/2017 1128   CO2 25 03/05/2017 1051   BUN 15 10/08/2017 1140   BUN 23 (H) 07/16/2017 1128   BUN 18.2 03/05/2017 1051   CREATININE 1.00 10/08/2017 1140   CREATININE 0.8 07/16/2017 1128    CREATININE 0.8 03/05/2017 1051      Component Value Date/Time   CALCIUM 9.3 10/08/2017 1140   CALCIUM 9.7 07/16/2017 1128   CALCIUM 8.9 03/05/2017 1051   ALKPHOS 69 10/08/2017 1140   ALKPHOS 71 07/16/2017 1128   ALKPHOS 67 03/05/2017 1051   AST 26 10/08/2017 1140   AST 15 03/05/2017 1051   ALT 25 10/08/2017 1140   ALT  23 07/16/2017 1128   ALT 17 03/05/2017 1051   BILITOT 1.0 10/08/2017 1140   BILITOT 0.55 03/05/2017 1051      Impression and Plan: Ms. Schauf is a very pleasant 72 yo caucasian female with IgG Kappa Myeloma. She has done well with Velcade and has no complaints at this time. Her counts have remained stable.   So far, everything is going quite well.  I do not see that we have to add Revlimid to her protocol.  We will go ahead and get her back in 1 more month.  [name], MD 3/20/20191:55 PM

## 2017-10-09 LAB — IGG, IGA, IGM
IGA: 15 mg/dL — AB (ref 64–422)
IGM (IMMUNOGLOBULIN M), SRM: 17 mg/dL — AB (ref 26–217)
IgG (Immunoglobin G), Serum: 310 mg/dL — ABNORMAL LOW (ref 700–1600)

## 2017-10-09 LAB — KAPPA/LAMBDA LIGHT CHAINS
KAPPA FREE LGHT CHN: 4 mg/L (ref 3.3–19.4)
Kappa, lambda light chain ratio: 2 — ABNORMAL HIGH (ref 0.26–1.65)
LAMDA FREE LIGHT CHAINS: 2 mg/L — AB (ref 5.7–26.3)

## 2017-10-13 LAB — PROTEIN ELECTROPHORESIS, SERUM, WITH REFLEX
A/G RATIO SPE: 1.8 — AB (ref 0.7–1.7)
ALBUMIN ELP: 4 g/dL (ref 2.9–4.4)
ALPHA-1-GLOBULIN: 0.2 g/dL (ref 0.0–0.4)
Alpha-2-Globulin: 0.8 g/dL (ref 0.4–1.0)
BETA GLOBULIN: 1 g/dL (ref 0.7–1.3)
GAMMA GLOBULIN: 0.3 g/dL — AB (ref 0.4–1.8)
Globulin, Total: 2.2 g/dL (ref 2.2–3.9)
SPEP Interpretation: 0
Total Protein ELP: 6.2 g/dL (ref 6.0–8.5)

## 2017-10-17 ENCOUNTER — Other Ambulatory Visit: Payer: Self-pay

## 2017-10-20 ENCOUNTER — Other Ambulatory Visit: Payer: Self-pay | Admitting: Hematology & Oncology

## 2017-10-20 ENCOUNTER — Other Ambulatory Visit: Payer: Self-pay | Admitting: Internal Medicine

## 2017-10-20 DIAGNOSIS — C9000 Multiple myeloma not having achieved remission: Secondary | ICD-10-CM

## 2017-10-26 ENCOUNTER — Other Ambulatory Visit: Payer: Self-pay | Admitting: Internal Medicine

## 2017-11-03 ENCOUNTER — Other Ambulatory Visit: Payer: PPO | Admitting: Internal Medicine

## 2017-11-03 DIAGNOSIS — Z9229 Personal history of other drug therapy: Secondary | ICD-10-CM

## 2017-11-03 DIAGNOSIS — I10 Essential (primary) hypertension: Secondary | ICD-10-CM | POA: Diagnosis not present

## 2017-11-03 LAB — LIPID PANEL
CHOLESTEROL: 194 mg/dL (ref <200)
HDL: 66 mg/dL (ref >50)
LDL Cholesterol (Calc): 108 mg/dL (calc) — ABNORMAL HIGH
Non-HDL Cholesterol (Calc): 128 mg/dL (calc) (ref <130)
Total CHOL/HDL Ratio: 2.9 (calc) (ref <5.0)
Triglycerides: 102 mg/dL (ref <150)

## 2017-11-03 LAB — BASIC METABOLIC PANEL
BUN: 15 mg/dL (ref 7–25)
CALCIUM: 8.9 mg/dL (ref 8.6–10.4)
CHLORIDE: 106 mmol/L (ref 98–110)
CO2: 28 mmol/L (ref 20–32)
Creat: 0.76 mg/dL (ref 0.60–0.93)
Glucose, Bld: 87 mg/dL (ref 65–99)
POTASSIUM: 4.1 mmol/L (ref 3.5–5.3)
Sodium: 141 mmol/L (ref 135–146)

## 2017-11-03 LAB — HEPATIC FUNCTION PANEL
AG Ratio: 2.4 (calc) (ref 1.0–2.5)
ALKALINE PHOSPHATASE (APISO): 56 U/L (ref 33–130)
ALT: 12 U/L (ref 6–29)
AST: 15 U/L (ref 10–35)
Albumin: 4.1 g/dL (ref 3.6–5.1)
BILIRUBIN INDIRECT: 0.5 mg/dL (ref 0.2–1.2)
Bilirubin, Direct: 0.2 mg/dL (ref 0.0–0.2)
Globulin: 1.7 g/dL (calc) — ABNORMAL LOW (ref 1.9–3.7)
TOTAL PROTEIN: 5.8 g/dL — AB (ref 6.1–8.1)
Total Bilirubin: 0.7 mg/dL (ref 0.2–1.2)

## 2017-11-04 ENCOUNTER — Encounter: Payer: Self-pay | Admitting: Internal Medicine

## 2017-11-04 ENCOUNTER — Ambulatory Visit (INDEPENDENT_AMBULATORY_CARE_PROVIDER_SITE_OTHER): Payer: PPO | Admitting: Internal Medicine

## 2017-11-04 VITALS — BP 108/70 | HR 98 | Ht <= 58 in

## 2017-11-04 DIAGNOSIS — E7849 Other hyperlipidemia: Secondary | ICD-10-CM

## 2017-11-04 DIAGNOSIS — F329 Major depressive disorder, single episode, unspecified: Secondary | ICD-10-CM | POA: Diagnosis not present

## 2017-11-04 DIAGNOSIS — C9001 Multiple myeloma in remission: Secondary | ICD-10-CM

## 2017-11-04 DIAGNOSIS — F419 Anxiety disorder, unspecified: Secondary | ICD-10-CM | POA: Diagnosis not present

## 2017-11-04 DIAGNOSIS — I10 Essential (primary) hypertension: Secondary | ICD-10-CM | POA: Diagnosis not present

## 2017-11-04 DIAGNOSIS — F32A Depression, unspecified: Secondary | ICD-10-CM

## 2017-11-04 NOTE — Progress Notes (Signed)
   Subjective:    Patient ID: Debra Ruiz, female    DOB: 1946/07/05, 72 y.o.   MRN: [id number]  HPI  72 year old Female for follow up of HTN. Stopped HCTZ for HTN. Still on Bystolic and Norvasc. Still seeing Dr. Marin Olp.  She tells me that myeloma is still in remission.  No new complaints or problems.  Spirits seem to be pretty good.  Appetite is returning.  Lipid panel stable.  LDL is 108 but otherwise normal.  Liver functions are normal.  Basic metabolic panel is normal.  Remains on B amlodipine.  Is on Lipitor for hyperlipidemia.  Takes meloxicam for musculoskeletal pain.ystolic and has baclofen for muscle relaxant.  Takes Xanax sparingly for anxiety.  Is on potassium supplement and Lexapro.  However, she stopped taking HCTZ so she can probably stop her potassium supplement.  She was given Prevnar 13 in January 2019 at [facility].      Review of Systems see above     Objective:   Physical Exam Blood pressure 108/70.  Chest clear to auscultation.  Cardiac exam regular rate and rhythm.  Extremities without edema       Assessment & Plan:  Essential hypertension-stable.  Has stopped HCTZ.  May stop potassium supplement.  She will continue with amlodipine and Bystolic  Hyperlipidemia-continue Lipitor 20 mg daily  Musculoskeletal pain  Anxiety depression has Xanax and Lexapro and is stable on these medications.  Multiple myeloma being treated by Dr. Marin Olp    Physical exam due after May 06, 2018.  Continue follow-up with Dr. Marin Olp for multiple myeloma

## 2017-11-05 ENCOUNTER — Inpatient Hospital Stay: Payer: PPO

## 2017-11-05 ENCOUNTER — Inpatient Hospital Stay: Payer: PPO | Attending: Hematology & Oncology | Admitting: Hematology & Oncology

## 2017-11-05 ENCOUNTER — Other Ambulatory Visit: Payer: Self-pay | Admitting: Family

## 2017-11-05 ENCOUNTER — Other Ambulatory Visit: Payer: Self-pay

## 2017-11-05 ENCOUNTER — Encounter: Payer: Self-pay | Admitting: Hematology & Oncology

## 2017-11-05 DIAGNOSIS — C9000 Multiple myeloma not having achieved remission: Secondary | ICD-10-CM

## 2017-11-05 DIAGNOSIS — Z5112 Encounter for antineoplastic immunotherapy: Secondary | ICD-10-CM | POA: Insufficient documentation

## 2017-11-05 LAB — CMP (CANCER CENTER ONLY)
ALBUMIN: 3.6 g/dL (ref 3.5–5.0)
ALT: 23 U/L (ref 10–47)
AST: 22 U/L (ref 11–38)
Alkaline Phosphatase: 61 U/L (ref 26–84)
Anion gap: 6 (ref 5–15)
BUN: 20 mg/dL (ref 7–22)
CHLORIDE: 105 mmol/L (ref 98–108)
CO2: 32 mmol/L (ref 18–33)
CREATININE: 0.9 mg/dL (ref 0.60–1.20)
Calcium: 9.7 mg/dL (ref 8.0–10.3)
Glucose, Bld: 98 mg/dL (ref 73–118)
Potassium: 4.3 mmol/L (ref 3.3–4.7)
SODIUM: 143 mmol/L (ref 128–145)
Total Bilirubin: 0.8 mg/dL (ref 0.2–1.6)
Total Protein: 6 g/dL — ABNORMAL LOW (ref 6.4–8.1)

## 2017-11-05 LAB — CBC WITH DIFFERENTIAL (CANCER CENTER ONLY)
Basophils Absolute: 0 10*3/uL (ref 0.0–0.1)
Basophils Relative: 0 %
EOS ABS: 0.1 10*3/uL (ref 0.0–0.5)
EOS PCT: 1 %
HCT: 40.1 % (ref 34.8–46.6)
Hemoglobin: 13.6 g/dL (ref 11.6–15.9)
LYMPHS ABS: 1.1 10*3/uL (ref 0.9–3.3)
Lymphocytes Relative: 17 %
MCH: 32.9 pg (ref 26.0–34.0)
MCHC: 33.9 g/dL (ref 32.0–36.0)
MCV: 97.1 fL (ref 81.0–101.0)
MONO ABS: 0.9 10*3/uL (ref 0.1–0.9)
MONOS PCT: 14 %
Neutro Abs: 4.5 10*3/uL (ref 1.5–6.5)
Neutrophils Relative %: 68 %
PLATELETS: 231 10*3/uL (ref 145–400)
RBC: 4.13 MIL/uL (ref 3.70–5.32)
RDW: 12.8 % (ref 11.1–15.7)
WBC Count: 6.6 10*3/uL (ref 3.9–10.0)

## 2017-11-05 MED ORDER — PROCHLORPERAZINE MALEATE 10 MG PO TABS: 10.0000 mg | ORAL_TABLET | Freq: Once | ORAL | Status: DC

## 2017-11-05 MED ORDER — FAMCICLOVIR 250 MG PO TABS: 250.0000 mg | ORAL_TABLET | Freq: Every day | ORAL | 6 refills | Status: DC

## 2017-11-05 MED ORDER — DENOSUMAB 120 MG/1.7ML ~~LOC~~ SOLN
SUBCUTANEOUS | Status: AC
  Filled 2017-11-05: qty 1.7

## 2017-11-05 MED ORDER — DENOSUMAB 120 MG/1.7ML ~~LOC~~ SOLN
120.0000 mg | Freq: Once | SUBCUTANEOUS | Status: AC
  Administered 2017-11-05: 120 mg via SUBCUTANEOUS

## 2017-11-05 MED ORDER — BORTEZOMIB CHEMO SQ INJECTION 3.5 MG (2.5MG/ML)
1.3000 mg/m2 | Freq: Once | INTRAMUSCULAR | Status: AC
  Administered 2017-11-05: 2 mg via SUBCUTANEOUS
  Filled 2017-11-05: qty 2

## 2017-11-05 NOTE — Patient Instructions (Signed)
[facility] Discharge Instructions for Patients Receiving Chemotherapy  Today you received the following chemotherapy agents Velcade To help prevent nausea and vomiting after your treatment, we encourage you to take your nausea medication as prescribed.   If you develop nausea and vomiting that is not controlled by your nausea medication, call the clinic.   BELOW ARE SYMPTOMS THAT SHOULD BE REPORTED IMMEDIATELY:  *FEVER GREATER THAN 100.5 F  *CHILLS WITH OR WITHOUT FEVER  NAUSEA AND VOMITING THAT IS NOT CONTROLLED WITH YOUR NAUSEA MEDICATION  *UNUSUAL SHORTNESS OF BREATH  *UNUSUAL BRUISING OR BLEEDING  TENDERNESS IN MOUTH AND THROAT WITH OR WITHOUT PRESENCE OF ULCERS  *URINARY PROBLEMS  *BOWEL PROBLEMS  UNUSUAL RASH Items with * indicate a potential emergency and should be followed up as soon as possible.  Feel free to call the clinic should you have any questions or concerns. The clinic phone number is [phone].  Please show the CHEMO ALERT CARD at check-in to the Emergency Department and triage nurse.   

## 2017-11-05 NOTE — Progress Notes (Signed)
Hematology and Oncology Follow Up Visit  Debra Ruiz [id number] [birth year] 72 y.o. 11/05/2017   Principle Diagnosis:  IgG Kappa myeloma - compression fractures - 13q-, +11, 14q+ - hyperdiploid  Completed Therapy: S/p Kyphoplasty of T11 and T12 XRT to lower spine-completed on 09/25/2016  Current Therapy:   Velcade/Decadron -status post cycle 8 - Revlimid stopped on 11/06/2016 Xgeva 133m Sq q  3 month - next dose in July 2019   Interim History:  Debra Ruiz here today for follow-up.  She really is doing quite well.  Every time I see her, she continues to look better and better.  She is more active.  She is able to do more.  Her back is not bothering her as much.  When we last saw her, she had no monoclonal spike in her blood.  Her IgG level was 310 mg/dL.  Her kappa light chain was 0.4 mg/dL.  She has had no fever.  She has had no change in bowel or bladder habits.  There is been no cough.  She has had no rashes.  There has been no leg swelling.  Overall, her performance status is ECOG 1.     Medications:  Allergies as of 11/05/2017      Reactions   Crestor [rosuvastatin Calcium]    Causes liver functions to be elevated      Medication List        Accurate as of 11/05/17  1:37 PM. Always use your most recent med list.          ALPRAZolam 0.25 MG tablet Commonly known as:  XANAX TAKE 1 TABLET BY MOUTH TWICE DAILY AS NEEDED FOR ANXIETY   amLODipine 5 MG tablet Commonly known as:  NORVASC TAKE 1 TABLET(5 MG) BY MOUTH DAILY   aspirin EC 81 MG tablet Take 81 mg by mouth every other day.   atorvastatin 20 MG tablet Commonly known as:  LIPITOR TAKE 1 TABLET(20 MG) BY MOUTH DAILY   Baclofen 5 MG Tabs TK 1 T PO TID PRN   BYSTOLIC 5 MG tablet Generic drug:  nebivolol TAKE 1 TABLET(5 MG) BY MOUTH DAILY   cholecalciferol 1000 units tablet Commonly known as:  VITAMIN D Take 2,000 Units by mouth daily.   dexamethasone 4 MG tablet Commonly known as:   DECADRON Take 5 pills with food once a week   escitalopram 20 MG tablet Commonly known as:  LEXAPRO Take 1 tablet (20 mg total) by mouth daily.   famciclovir 250 MG tablet Commonly known as:  FAMVIR Take 1 tablet (250 mg total) by mouth daily.   Melatonin 10 MG Tabs Take 2 tablets by mouth daily.   meloxicam 15 MG tablet Commonly known as:  MOBIC TK 1 T PO QD WF PRN   potassium chloride SA 20 MEQ tablet Commonly known as:  K-DUR,KLOR-CON TAKE 1 TABLET(20 MEQ) BY MOUTH DAILY   PREVNAR 13 Susp injection Generic drug:  pneumococcal 13-valent conjugate vaccine ADM 0.5ML IM UTD   prochlorperazine 10 MG tablet Commonly known as:  COMPAZINE TAKE 1 TABLET(10 MG) BY MOUTH EVERY 6 HOURS AS NEEDED FOR NAUSEA OR VOMITING   valACYclovir 1000 MG tablet Commonly known as:  VALTREX Take by mouth.       Allergies:  Allergies  Allergen Reactions  . Crestor [Rosuvastatin Calcium]     Causes liver functions to be elevated    Past Medical History, Surgical history, Social history, and Family History were reviewed and updated.  Review of  Systems: Review of Systems  Constitutional: Negative.   HENT: Negative.   Eyes: Negative.   Respiratory: Negative.   Cardiovascular: Negative.   Gastrointestinal: Negative.   Genitourinary: Negative.   Musculoskeletal: Positive for back pain.  Skin: Negative.   Neurological: Negative.   Endo/Heme/Allergies: Negative.   Psychiatric/Behavioral: Negative.     Physical Exam:  weight is 117 lb (53.1 kg). Her oral temperature is 97.8 F (36.6 C). Her blood pressure is 126/66 and her pulse is 72. Her respiration is 20 and oxygen saturation is 100%.   Wt Readings from Last 3 Encounters:  11/05/17 117 lb (53.1 kg)  10/08/17 115 lb 6.4 oz (52.3 kg)  09/10/17 116 lb 12.8 oz (53 kg)    Physical Exam  Constitutional: She is oriented to person, place, and time.  HENT:  Head: Normocephalic and atraumatic.  Mouth/Throat: Oropharynx is clear and  moist.  Eyes: Pupils are equal, round, and reactive to light. EOM are normal.  Neck: Normal range of motion.  Cardiovascular: Normal rate, regular rhythm and normal heart sounds.  Pulmonary/Chest: Effort normal and breath sounds normal.  Abdominal: Soft. Bowel sounds are normal.  Musculoskeletal: Normal range of motion. She exhibits no edema, tenderness or deformity.  Lymphadenopathy:    She has no cervical adenopathy.  Neurological: She is alert and oriented to person, place, and time.  Skin: Skin is warm and dry. No rash noted. No erythema.  Psychiatric: She has a normal mood and affect. Her behavior is normal. Judgment and thought content normal.  Vitals reviewed.     Lab Results  Component Value Date   WBC 6.6 11/05/2017   HGB 13.9 07/16/2017   HCT 40.1 11/05/2017   MCV 97.1 11/05/2017   PLT 231 11/05/2017   No results found for: FERRITIN, IRON, TIBC, UIBC, IRONPCTSAT Lab Results  Component Value Date   RBC 4.13 11/05/2017   Lab Results  Component Value Date   KPAFRELGTCHN 4.0 10/08/2017   LAMBDASER 2.0 (L) 10/08/2017   KAPLAMBRATIO 2.00 (H) 10/08/2017   Lab Results  Component Value Date   IGGSERUM 310 (L) 10/08/2017   IGA 15 (L) 10/08/2017   IGMSERUM 17 (L) 10/08/2017   Lab Results  Component Value Date   TOTALPROTELP 6.2 10/08/2017   ALBUMINELP 4.0 10/08/2017   A1GS 0.2 10/08/2017   A2GS 0.8 10/08/2017   BETS 1.0 10/08/2017   GAMS 0.3 (L) 10/08/2017   MSPIKE Not Observed 10/08/2017     Chemistry      Component Value Date/Time   NA 143 11/05/2017 1154   NA 148 (H) 07/16/2017 1128   NA 143 03/05/2017 1051   K 4.3 11/05/2017 1154   K 3.8 07/16/2017 1128   K 3.7 03/05/2017 1051   CL 105 11/05/2017 1154   CL 106 07/16/2017 1128   CO2 32 11/05/2017 1154   CO2 29 07/16/2017 1128   CO2 25 03/05/2017 1051   BUN 20 11/05/2017 1154   BUN 23 (H) 07/16/2017 1128   BUN 18.2 03/05/2017 1051   CREATININE 0.90 11/05/2017 1154   CREATININE 0.76 11/03/2017 0919    CREATININE 0.8 03/05/2017 1051      Component Value Date/Time   CALCIUM 9.7 11/05/2017 1154   CALCIUM 9.7 07/16/2017 1128   CALCIUM 8.9 03/05/2017 1051   ALKPHOS 61 11/05/2017 1154   ALKPHOS 71 07/16/2017 1128   ALKPHOS 67 03/05/2017 1051   AST 22 11/05/2017 1154   AST 15 03/05/2017 1051   ALT 23 11/05/2017 1154  ALT 23 07/16/2017 1128   ALT 17 03/05/2017 1051   BILITOT 0.8 11/05/2017 1154   BILITOT 0.55 03/05/2017 1051      Impression and Plan: Ms. Tiznado is a very pleasant 72 yo caucasian female with IgG Kappa Myeloma. She has done well with Velcade and has no complaints at this time. Her counts have remained stable.   So far, everything is going quite well.  I do not see that we have to add Revlimid to her protocol.  I will now move her treatments out to every 6 weeks.  I think this would be reasonable.  This certainly would accommodate her busy social schedule.    [name], MD 4/17/20191:37 PM

## 2017-11-06 LAB — IGG, IGA, IGM
IGA: 12 mg/dL — AB (ref 64–422)
IGM (IMMUNOGLOBULIN M), SRM: 14 mg/dL — AB (ref 26–217)
IgG (Immunoglobin G), Serum: 283 mg/dL — ABNORMAL LOW (ref 700–1600)

## 2017-11-06 LAB — KAPPA/LAMBDA LIGHT CHAINS
KAPPA, LAMDA LIGHT CHAIN RATIO: 1.58 (ref 0.26–1.65)
Kappa free light chain: 4.1 mg/L (ref 3.3–19.4)
LAMDA FREE LIGHT CHAINS: 2.6 mg/L — AB (ref 5.7–26.3)

## 2017-11-07 LAB — PROTEIN ELECTROPHORESIS, SERUM, WITH REFLEX
A/G RATIO SPE: 1.9 — AB (ref 0.7–1.7)
ALBUMIN ELP: 3.8 g/dL (ref 2.9–4.4)
ALPHA-1-GLOBULIN: 0.2 g/dL (ref 0.0–0.4)
Alpha-2-Globulin: 0.7 g/dL (ref 0.4–1.0)
Beta Globulin: 0.9 g/dL (ref 0.7–1.3)
Gamma Globulin: 0.3 g/dL — ABNORMAL LOW (ref 0.4–1.8)
Globulin, Total: 2 g/dL — ABNORMAL LOW (ref 2.2–3.9)
Total Protein ELP: 5.8 g/dL — ABNORMAL LOW (ref 6.0–8.5)

## 2017-11-10 ENCOUNTER — Telehealth: Payer: Self-pay | Admitting: *Deleted

## 2017-11-10 NOTE — Telephone Encounter (Addendum)
-----   Message from [name], MD sent at 11/07/2017  5:06 PM EDT ----- Called patient to let her know there is NO myeloma in the blood.

## 2017-11-17 NOTE — Patient Instructions (Signed)
It was a pleasure to see you today.  We are glad myeloma is in remission.  Continue same medications and follow-up after May 06, 2018.  Continue follow-up with Dr. Marin Olp.

## 2017-12-02 ENCOUNTER — Other Ambulatory Visit: Payer: Self-pay | Admitting: Internal Medicine

## 2017-12-02 DIAGNOSIS — C9000 Multiple myeloma not having achieved remission: Secondary | ICD-10-CM

## 2017-12-02 DIAGNOSIS — E782 Mixed hyperlipidemia: Secondary | ICD-10-CM

## 2017-12-15 ENCOUNTER — Other Ambulatory Visit: Payer: Self-pay | Admitting: Hematology & Oncology

## 2017-12-15 ENCOUNTER — Other Ambulatory Visit: Payer: Self-pay | Admitting: Internal Medicine

## 2017-12-15 DIAGNOSIS — C9000 Multiple myeloma not having achieved remission: Secondary | ICD-10-CM

## 2017-12-17 ENCOUNTER — Inpatient Hospital Stay: Payer: PPO

## 2017-12-17 ENCOUNTER — Other Ambulatory Visit: Payer: Self-pay

## 2017-12-17 ENCOUNTER — Inpatient Hospital Stay: Payer: PPO | Attending: Hematology & Oncology | Admitting: Family

## 2017-12-17 VITALS — BP 97/57 | HR 84 | Temp 98.2°F | Resp 18 | Wt 115.8 lb

## 2017-12-17 VITALS — BP 128/58 | HR 73 | Resp 16

## 2017-12-17 DIAGNOSIS — C9000 Multiple myeloma not having achieved remission: Secondary | ICD-10-CM

## 2017-12-17 DIAGNOSIS — Z5112 Encounter for antineoplastic immunotherapy: Secondary | ICD-10-CM | POA: Diagnosis not present

## 2017-12-17 LAB — CMP (CANCER CENTER ONLY)
ALT: 31 U/L (ref 10–47)
ANION GAP: 8 (ref 5–15)
AST: 22 U/L (ref 11–38)
Albumin: 3.7 g/dL (ref 3.5–5.0)
Alkaline Phosphatase: 71 U/L (ref 26–84)
BUN: 23 mg/dL — AB (ref 7–22)
CHLORIDE: 106 mmol/L (ref 98–108)
CO2: 31 mmol/L (ref 18–33)
Calcium: 9.3 mg/dL (ref 8.0–10.3)
Creatinine: 1.1 mg/dL (ref 0.60–1.20)
GLUCOSE: 134 mg/dL — AB (ref 73–118)
POTASSIUM: 3.8 mmol/L (ref 3.3–4.7)
Sodium: 145 mmol/L (ref 128–145)
Total Bilirubin: 0.9 mg/dL (ref 0.2–1.6)
Total Protein: 6.5 g/dL (ref 6.4–8.1)

## 2017-12-17 LAB — CBC WITH DIFFERENTIAL (CANCER CENTER ONLY)
Basophils Absolute: 0 10*3/uL (ref 0.0–0.1)
Basophils Relative: 0 %
EOS PCT: 1 %
Eosinophils Absolute: 0.1 10*3/uL (ref 0.0–0.5)
HCT: 43.3 % (ref 34.8–46.6)
Hemoglobin: 14.7 g/dL (ref 11.6–15.9)
LYMPHS ABS: 1.2 10*3/uL (ref 0.9–3.3)
Lymphocytes Relative: 18 %
MCH: 32.9 pg (ref 26.0–34.0)
MCHC: 33.9 g/dL (ref 32.0–36.0)
MCV: 96.9 fL (ref 81.0–101.0)
MONO ABS: 0.7 10*3/uL (ref 0.1–0.9)
MONOS PCT: 11 %
Neutro Abs: 4.8 10*3/uL (ref 1.5–6.5)
Neutrophils Relative %: 70 %
PLATELETS: 231 10*3/uL (ref 145–400)
RBC: 4.47 MIL/uL (ref 3.70–5.32)
RDW: 12.7 % (ref 11.1–15.7)
WBC: 6.8 10*3/uL (ref 3.9–10.0)

## 2017-12-17 MED ORDER — BORTEZOMIB CHEMO SQ INJECTION 3.5 MG (2.5MG/ML)
1.3000 mg/m2 | Freq: Once | INTRAMUSCULAR | Status: AC
Start: 2017-12-17 — End: 2017-12-17
  Administered 2017-12-17: 2 mg via SUBCUTANEOUS
  Filled 2017-12-17: qty 2

## 2017-12-17 NOTE — Progress Notes (Signed)
Hematology and Oncology Follow Up Visit  Debra Ruiz [id number] 21-Aug-1945 72 y.o. 12/17/2017   Principle Diagnosis:  IgG Kappa myeloma - compression fractures - 13q-, +11, 14q+ - hyperdiploid  Completed Therapy: S/p Kyphoplasty of T11 and T12 XRT to lower spine-completed on 09/25/2016  Current Therapy:   Velcade/Decadron -status post cycle 8 - Revlimid stopped on 11/06/2016 Xgeva 168m Sq q 3 month - next dose in July 2019   Interim History:  Debra Ruiz here today with her husband for follow-up. She is doing quite well and has no complaints at this time.  She is getting over a sinus infection and still has some congestion and a dry cough.  Last month IgG level was 283 mg/dL, Kappa light chains 0.41 mg/dL and no M-spike detected No fever, chills, n/v, cough, rash, dizziness, SOB, chest pain, palpitations, abdominal pain or changes in bowel or bladder habits.  Sometimes she has diarrhea the day after treatment and will take imodium as needed.  No lymphadenopathy noted on exam.  No episodes of bleeding, no bruising or petechiae.  No swelling, tenderness, numbness or tingling in her extremities. No c/o pain.  She has maintained a good appetite and is staying well hydrated. Her weight is stable.  She plans to see a personal trainer and starting doing some strengthening exercises to help with her back.   ECOG Performance Status: 1 - Symptomatic but completely ambulatory  Medications:  Allergies as of 12/17/2017      Reactions   Crestor [rosuvastatin Calcium]    Causes liver functions to be elevated      Medication List        Accurate as of 12/17/17  1:35 PM. Always use your most recent med list.          ALPRAZolam 0.25 MG tablet Commonly known as:  XANAX TAKE 1 TABLET BY MOUTH TWICE DAILY AS NEEDED FOR ANXIETY   amLODipine 5 MG tablet Commonly known as:  NORVASC TAKE 1 TABLET(5 MG) BY MOUTH DAILY   aspirin EC 81 MG tablet Take 81 mg by mouth every other day.   atorvastatin 20 MG tablet Commonly known as:  LIPITOR TAKE 1 TABLET(20 MG) BY MOUTH DAILY   Baclofen 5 MG Tabs TK 1 T PO TID PRN   BYSTOLIC 5 MG tablet Generic drug:  nebivolol TAKE 1 TABLET(5 MG) BY MOUTH DAILY   cholecalciferol 1000 units tablet Commonly known as:  VITAMIN D Take 2,000 Units by mouth daily.   dexamethasone 4 MG tablet Commonly known as:  DECADRON Take 3 pills with food once a week   escitalopram 20 MG tablet Commonly known as:  LEXAPRO TAKE 1 TABLET(20 MG) BY MOUTH DAILY   famciclovir 250 MG tablet Commonly known as:  FAMVIR Take 1 tablet (250 mg total) by mouth daily.   Melatonin 10 MG Tabs Take 2 tablets by mouth daily.   meloxicam 15 MG tablet Commonly known as:  MOBIC TK 1 T PO QD WF PRN   PREVNAR 13 Susp injection Generic drug:  pneumococcal 13-valent conjugate vaccine ADM 0.5ML IM UTD   prochlorperazine 10 MG tablet Commonly known as:  COMPAZINE TAKE 1 TABLET(10 MG) BY MOUTH EVERY 6 HOURS AS NEEDED FOR NAUSEA OR VOMITING   valACYclovir 1000 MG tablet Commonly known as:  VALTREX Take by mouth.       Allergies:  Allergies  Allergen Reactions  . Crestor [Rosuvastatin Calcium]     Causes liver functions to be elevated  Past Medical History, Surgical history, Social history, and Family History were reviewed and updated.  Review of Systems: All other 10 point review of systems is negative.   Physical Exam:  weight is 115 lb 12 oz (52.5 kg). Her oral temperature is 98.2 F (36.8 C). Her blood pressure is 97/57 (abnormal) and her pulse is 84. Her respiration is 18 and oxygen saturation is 99%.   Wt Readings from Last 3 Encounters:  12/17/17 115 lb 12 oz (52.5 kg)  11/05/17 117 lb (53.1 kg)  10/08/17 115 lb 6.4 oz (52.3 kg)    Ocular: Sclerae unicteric, pupils equal, round and reactive to light Ear-nose-throat: Oropharynx clear, dentition fair Lymphatic: No cervical, supraclavicular or axillary adenopathy Lungs no rales or  rhonchi, good excursion bilaterally Heart regular rate and rhythm, no murmur appreciated Abd soft, nontender, positive bowel sounds, no liver or spleen tip palpated on exam, no fluid wave  MSK no focal spinal tenderness, no joint edema Neuro: non-focal, well-oriented, appropriate affect Breasts: Deferred    Lab Results  Component Value Date   WBC 6.8 12/17/2017   HGB 14.7 12/17/2017   HCT 43.3 12/17/2017   MCV 96.9 12/17/2017   PLT 231 12/17/2017   No results found for: FERRITIN, IRON, TIBC, UIBC, IRONPCTSAT Lab Results  Component Value Date   RBC 4.47 12/17/2017   Lab Results  Component Value Date   KPAFRELGTCHN 4.1 11/05/2017   LAMBDASER 2.6 (L) 11/05/2017   KAPLAMBRATIO 1.58 11/05/2017   Lab Results  Component Value Date   IGGSERUM 283 (L) 11/05/2017   IGA 12 (L) 11/05/2017   IGMSERUM 14 (L) 11/05/2017   Lab Results  Component Value Date   TOTALPROTELP 5.8 (L) 11/05/2017   ALBUMINELP 3.8 11/05/2017   A1GS 0.2 11/05/2017   A2GS 0.7 11/05/2017   BETS 0.9 11/05/2017   GAMS 0.3 (L) 11/05/2017   MSPIKE Not Observed 11/05/2017     Chemistry      Component Value Date/Time   NA 145 12/17/2017 1235   NA 148 (H) 07/16/2017 1128   NA 143 03/05/2017 1051   K 3.8 12/17/2017 1235   K 3.8 07/16/2017 1128   K 3.7 03/05/2017 1051   CL 106 12/17/2017 1235   CL 106 07/16/2017 1128   CO2 31 12/17/2017 1235   CO2 29 07/16/2017 1128   CO2 25 03/05/2017 1051   BUN 23 (H) 12/17/2017 1235   BUN 23 (H) 07/16/2017 1128   BUN 18.2 03/05/2017 1051   CREATININE 1.10 12/17/2017 1235   CREATININE 0.76 11/03/2017 0919   CREATININE 0.8 03/05/2017 1051      Component Value Date/Time   CALCIUM 9.3 12/17/2017 1235   CALCIUM 9.7 07/16/2017 1128   CALCIUM 8.9 03/05/2017 1051   ALKPHOS 71 12/17/2017 1235   ALKPHOS 71 07/16/2017 1128   ALKPHOS 67 03/05/2017 1051   AST 22 12/17/2017 1235   AST 15 03/05/2017 1051   ALT 31 12/17/2017 1235   ALT 23 07/16/2017 1128   ALT 17 03/05/2017  1051   BILITOT 0.9 12/17/2017 1235   BILITOT 0.55 03/05/2017 1051      Impression and Plan: Debra Ruiz is a very pleasant 72 yo caucasian female with IgG kappa myeloma. She continues to do well and her protein studies have remained stable. Today's results are pending.  We will proceed with treatment with Velcade today as planned.  We will see her back in another 6 weeks for follow-up, lab and treatment.  They will contact our office  with any questions or concerns. We can certainly see her sooner if need be.   Laverna Peace, NP 5/29/20191:35 PM

## 2017-12-17 NOTE — Patient Instructions (Signed)

## 2017-12-18 LAB — KAPPA/LAMBDA LIGHT CHAINS
Kappa free light chain: 6.7 mg/L (ref 3.3–19.4)
Kappa, lambda light chain ratio: 1.2 (ref 0.26–1.65)
Lambda free light chains: 5.6 mg/L — ABNORMAL LOW (ref 5.7–26.3)

## 2017-12-18 LAB — IGG, IGA, IGM
IGA: 18 mg/dL — AB (ref 64–422)
IGG (IMMUNOGLOBIN G), SERUM: 329 mg/dL — AB (ref 700–1600)
IgM (Immunoglobulin M), Srm: 26 mg/dL (ref 26–217)

## 2017-12-21 LAB — PROTEIN ELECTROPHORESIS, SERUM, WITH REFLEX
A/G Ratio: 1.7 (ref 0.7–1.7)
ALBUMIN ELP: 3.8 g/dL (ref 2.9–4.4)
ALPHA-1-GLOBULIN: 0.3 g/dL (ref 0.0–0.4)
ALPHA-2-GLOBULIN: 0.8 g/dL (ref 0.4–1.0)
Beta Globulin: 0.9 g/dL (ref 0.7–1.3)
GAMMA GLOBULIN: 0.3 g/dL — AB (ref 0.4–1.8)
Globulin, Total: 2.3 g/dL (ref 2.2–3.9)
Total Protein ELP: 6.1 g/dL (ref 6.0–8.5)

## 2018-01-16 ENCOUNTER — Other Ambulatory Visit: Payer: Self-pay | Admitting: Internal Medicine

## 2018-01-18 ENCOUNTER — Other Ambulatory Visit: Payer: Self-pay | Admitting: Hematology & Oncology

## 2018-01-19 MED ORDER — FAMOTIDINE IN NACL 20-0.9 MG/50ML-% IV SOLN
INTRAVENOUS | Status: AC
  Filled 2018-01-19: qty 50

## 2018-01-19 MED ORDER — DIPHENHYDRAMINE HCL 25 MG PO CAPS
ORAL_CAPSULE | ORAL | Status: AC
  Filled 2018-01-19: qty 1

## 2018-01-19 MED ORDER — METHYLPREDNISOLONE SODIUM SUCC 125 MG IJ SOLR
INTRAMUSCULAR | Status: AC
  Filled 2018-01-19: qty 2

## 2018-01-20 ENCOUNTER — Other Ambulatory Visit: Payer: Self-pay | Admitting: Internal Medicine

## 2018-01-28 ENCOUNTER — Other Ambulatory Visit: Payer: Self-pay

## 2018-01-28 ENCOUNTER — Inpatient Hospital Stay: Payer: PPO

## 2018-01-28 ENCOUNTER — Inpatient Hospital Stay: Payer: PPO | Attending: Hematology & Oncology | Admitting: Hematology & Oncology

## 2018-01-28 VITALS — BP 130/61 | HR 73 | Temp 98.4°F | Resp 20 | Wt 118.4 lb

## 2018-01-28 DIAGNOSIS — C9000 Multiple myeloma not having achieved remission: Secondary | ICD-10-CM | POA: Insufficient documentation

## 2018-01-28 DIAGNOSIS — Z5112 Encounter for antineoplastic immunotherapy: Secondary | ICD-10-CM | POA: Insufficient documentation

## 2018-01-28 LAB — CMP (CANCER CENTER ONLY)
ALT: 22 U/L (ref 10–47)
AST: 27 U/L (ref 11–38)
Albumin: 3.7 g/dL (ref 3.5–5.0)
Alkaline Phosphatase: 68 U/L (ref 26–84)
Anion gap: 7 (ref 5–15)
BUN: 20 mg/dL (ref 7–22)
CHLORIDE: 107 mmol/L (ref 98–108)
CO2: 30 mmol/L (ref 18–33)
Calcium: 9.4 mg/dL (ref 8.0–10.3)
Creatinine: 0.8 mg/dL (ref 0.60–1.20)
GLUCOSE: 100 mg/dL (ref 73–118)
POTASSIUM: 4 mmol/L (ref 3.3–4.7)
SODIUM: 144 mmol/L (ref 128–145)
Total Bilirubin: 0.9 mg/dL (ref 0.2–1.6)
Total Protein: 6.5 g/dL (ref 6.4–8.1)

## 2018-01-28 LAB — CBC WITH DIFFERENTIAL (CANCER CENTER ONLY)
Basophils Absolute: 0 10*3/uL (ref 0.0–0.1)
Basophils Relative: 0 %
EOS ABS: 0.1 10*3/uL (ref 0.0–0.5)
EOS PCT: 1 %
HCT: 40.3 % (ref 34.8–46.6)
Hemoglobin: 13.8 g/dL (ref 11.6–15.9)
LYMPHS ABS: 1.3 10*3/uL (ref 0.9–3.3)
Lymphocytes Relative: 17 %
MCH: 33.2 pg (ref 26.0–34.0)
MCHC: 34.2 g/dL (ref 32.0–36.0)
MCV: 96.9 fL (ref 81.0–101.0)
MONOS PCT: 13 %
Monocytes Absolute: 1 10*3/uL — ABNORMAL HIGH (ref 0.1–0.9)
Neutro Abs: 5.2 10*3/uL (ref 1.5–6.5)
Neutrophils Relative %: 69 %
PLATELETS: 241 10*3/uL (ref 145–400)
RBC: 4.16 MIL/uL (ref 3.70–5.32)
RDW: 12.6 % (ref 11.1–15.7)
WBC Count: 7.6 10*3/uL (ref 3.9–10.0)

## 2018-01-28 MED ORDER — METHYLPREDNISOLONE SODIUM SUCC 125 MG IJ SOLR: 125.0000 mg | Freq: Once | INTRAMUSCULAR | Status: DC | PRN

## 2018-01-28 MED ORDER — ALBUTEROL SULFATE (2.5 MG/3ML) 0.083% IN NEBU
2.5000 mg | INHALATION_SOLUTION | Freq: Once | RESPIRATORY_TRACT | Status: DC | PRN
  Filled 2018-01-28: qty 3

## 2018-01-28 MED ORDER — SODIUM CHLORIDE 0.9 % IV SOLN: Freq: Once | INTRAVENOUS | Status: DC | PRN

## 2018-01-28 MED ORDER — BORTEZOMIB CHEMO SQ INJECTION 3.5 MG (2.5MG/ML)
1.3000 mg/m2 | Freq: Once | INTRAMUSCULAR | Status: AC
  Administered 2018-01-28: 2 mg via SUBCUTANEOUS
  Filled 2018-01-28: qty 0.8

## 2018-01-28 MED ORDER — DENOSUMAB 120 MG/1.7ML ~~LOC~~ SOLN
120.0000 mg | Freq: Once | SUBCUTANEOUS | Status: AC
  Administered 2018-01-28: 120 mg via SUBCUTANEOUS

## 2018-01-28 MED ORDER — DIPHENHYDRAMINE HCL 50 MG/ML IJ SOLN: 50.0000 mg | Freq: Once | INTRAMUSCULAR | Status: DC | PRN

## 2018-01-28 MED ORDER — DENOSUMAB 120 MG/1.7ML ~~LOC~~ SOLN
SUBCUTANEOUS | Status: AC
  Filled 2018-01-28: qty 1.7

## 2018-01-28 MED ORDER — EPINEPHRINE HCL 0.1 MG/ML IJ SOLN: 0.2500 mg | Freq: Once | INTRAMUSCULAR | Status: DC | PRN

## 2018-01-28 MED ORDER — DIPHENHYDRAMINE HCL 50 MG/ML IJ SOLN
25.0000 mg | Freq: Once | INTRAMUSCULAR | Status: DC | PRN
Start: 2018-01-28 — End: 2018-01-28

## 2018-01-28 MED ORDER — PROCHLORPERAZINE MALEATE 10 MG PO TABS: 10.0000 mg | ORAL_TABLET | Freq: Once | ORAL | Status: DC

## 2018-01-28 NOTE — Patient Instructions (Signed)

## 2018-01-28 NOTE — Progress Notes (Signed)
Hematology and Oncology Follow Up Visit  Debra Ruiz [id number] [birth year] 72 y.o. 01/28/2018   Principle Diagnosis:  IgG Kappa myeloma - compression fractures - 13q-, +11, 14q+ - hyperdiploid  Completed Therapy: S/p Kyphoplasty of T11 and T12 XRT to lower spine-completed on 09/25/2016  Current Therapy:   Velcade/Decadron -status post cycle #16 - Revlimid stopped on 11/06/2016 Xgeva 161m Sq q  3 month - next dose in October 2019   Interim History:  Debra Ruiz here today for follow-up.  She has she comes in with her twin sister.  I do not know that she had a twin sister.  They look like they are maternal twins.  It is fun talking with both of them.  Ms. JPerroneis doing quite well.  Her last monoclonal studies did not show a monoclonal spike.  She is getting ready for her monthly holiday.  She and the whole family are going to the beach in August.  She has had no problems with fever.  She is had no problems with bleeding.  She is had no change in bowel or bladder habits.  She does have a little bit of diarrhea the day after Velcade.  She has had no leg swelling.  She has had no rashes. Thank you Overall, her performance status is ECOG 1.     Medications:  Allergies as of 01/28/2018      Reactions   Crestor [rosuvastatin Calcium]    Causes liver functions to be elevated      Medication List        Accurate as of 01/28/18 12:47 PM. Always use your most recent med list.          ALPRAZolam 0.25 MG tablet Commonly known as:  XANAX TAKE 1 TABLET BY MOUTH TWICE DAILY AS NEEDED FOR ANXIETY   amLODipine 5 MG tablet Commonly known as:  NORVASC TAKE 1 TABLET(5 MG) BY MOUTH DAILY   aspirin EC 81 MG tablet Take 81 mg by mouth every other day.   atorvastatin 20 MG tablet Commonly known as:  LIPITOR TAKE 1 TABLET(20 MG) BY MOUTH DAILY   Baclofen 5 MG Tabs TK 1 T PO TID PRN   BYSTOLIC 5 MG tablet Generic drug:  nebivolol TAKE 1 TABLET(5 MG) BY MOUTH DAILY     cholecalciferol 1000 units tablet Commonly known as:  VITAMIN D Take 2,000 Units by mouth daily.   dexamethasone 4 MG tablet Commonly known as:  DECADRON Take 3 pills with food once a week   escitalopram 20 MG tablet Commonly known as:  LEXAPRO TAKE 1 TABLET(20 MG) BY MOUTH DAILY   famciclovir 250 MG tablet Commonly known as:  FAMVIR Take 1 tablet (250 mg total) by mouth daily.   Melatonin 10 MG Tabs Take 2 tablets by mouth daily.   meloxicam 15 MG tablet Commonly known as:  MOBIC TK 1 T PO QD WF PRN   potassium chloride SA 20 MEQ tablet Commonly known as:  K-DUR,KLOR-CON TAKE 1 TABLET(20 MEQ) BY MOUTH DAILY   PREVNAR 13 Susp injection Generic drug:  pneumococcal 13-valent conjugate vaccine ADM 0.5ML IM UTD   prochlorperazine 10 MG tablet Commonly known as:  COMPAZINE TAKE 1 TABLET(10 MG) BY MOUTH EVERY 6 HOURS AS NEEDED FOR NAUSEA OR VOMITING   valACYclovir 1000 MG tablet Commonly known as:  VALTREX Take by mouth.       Allergies:  Allergies  Allergen Reactions  . Crestor [Rosuvastatin Calcium]     Causes liver  functions to be elevated    Past Medical History, Surgical history, Social history, and Family History were reviewed and updated.  Review of Systems: Review of Systems  Constitutional: Negative.   HENT: Negative.   Eyes: Negative.   Respiratory: Negative.   Cardiovascular: Negative.   Gastrointestinal: Negative.   Genitourinary: Negative.   Musculoskeletal: Positive for back pain.  Skin: Negative.   Neurological: Negative.   Endo/Heme/Allergies: Negative.   Psychiatric/Behavioral: Negative.     Physical Exam:  weight is 118 lb 6.4 oz (53.7 kg). Her oral temperature is 98.4 F (36.9 C). Her blood pressure is 130/61 and her pulse is 73. Her respiration is 20 and oxygen saturation is 100%.   Wt Readings from Last 3 Encounters:  01/28/18 118 lb 6.4 oz (53.7 kg)  12/17/17 115 lb 12 oz (52.5 kg)  11/05/17 117 lb (53.1 kg)    Physical  Exam  Constitutional: She is oriented to person, place, and time.  HENT:  Head: Normocephalic and atraumatic.  Mouth/Throat: Oropharynx is clear and moist.  Eyes: Pupils are equal, round, and reactive to light. EOM are normal.  Neck: Normal range of motion.  Cardiovascular: Normal rate, regular rhythm and normal heart sounds.  Pulmonary/Chest: Effort normal and breath sounds normal.  Abdominal: Soft. Bowel sounds are normal.  Musculoskeletal: Normal range of motion. She exhibits no edema, tenderness or deformity.  Lymphadenopathy:    She has no cervical adenopathy.  Neurological: She is alert and oriented to person, place, and time.  Skin: Skin is warm and dry. No rash noted. No erythema.  Psychiatric: She has a normal mood and affect. Her behavior is normal. Judgment and thought content normal.  Vitals reviewed.     Lab Results  Component Value Date   WBC 7.6 01/28/2018   HGB 13.8 01/28/2018   HCT 40.3 01/28/2018   MCV 96.9 01/28/2018   PLT 241 01/28/2018   No results found for: FERRITIN, IRON, TIBC, UIBC, IRONPCTSAT Lab Results  Component Value Date   RBC 4.16 01/28/2018   Lab Results  Component Value Date   KPAFRELGTCHN 6.7 12/17/2017   LAMBDASER 5.6 (L) 12/17/2017   KAPLAMBRATIO 1.20 12/17/2017   Lab Results  Component Value Date   IGGSERUM 329 (L) 12/17/2017   IGA 18 (L) 12/17/2017   IGMSERUM 26 12/17/2017   Lab Results  Component Value Date   TOTALPROTELP 6.1 12/17/2017   ALBUMINELP 3.8 12/17/2017   A1GS 0.3 12/17/2017   A2GS 0.8 12/17/2017   BETS 0.9 12/17/2017   GAMS 0.3 (L) 12/17/2017   MSPIKE Not Observed 12/17/2017     Chemistry      Component Value Date/Time   NA 144 01/28/2018 1129   NA 148 (H) 07/16/2017 1128   NA 143 03/05/2017 1051   K 4.0 01/28/2018 1129   K 3.8 07/16/2017 1128   K 3.7 03/05/2017 1051   CL 107 01/28/2018 1129   CL 106 07/16/2017 1128   CO2 30 01/28/2018 1129   CO2 29 07/16/2017 1128   CO2 25 03/05/2017 1051   BUN  20 01/28/2018 1129   BUN 23 (H) 07/16/2017 1128   BUN 18.2 03/05/2017 1051   CREATININE 0.80 01/28/2018 1129   CREATININE 0.76 11/03/2017 0919   CREATININE 0.8 03/05/2017 1051      Component Value Date/Time   CALCIUM 9.4 01/28/2018 1129   CALCIUM 9.7 07/16/2017 1128   CALCIUM 8.9 03/05/2017 1051   ALKPHOS 68 01/28/2018 1129   ALKPHOS 71 07/16/2017 1128  ALKPHOS 67 03/05/2017 1051   AST 27 01/28/2018 1129   AST 15 03/05/2017 1051   ALT 22 01/28/2018 1129   ALT 23 07/16/2017 1128   ALT 17 03/05/2017 1051   BILITOT 0.9 01/28/2018 1129   BILITOT 0.55 03/05/2017 1051      Impression and Plan: Ms. Debra Ruiz is a very pleasant 72 yo caucasian female with IgG Kappa Myeloma. She has done well with Velcade and has no complaints at this time. Her counts have remained stable.   For right now, I will not make any changes with her protocol.  She is doing well with the Velcade every 6 weeks.  Hopefully, she will stay in remission for a while.  I will see her back in 6 weeks after she gets back from her vacation.  Again, I was not aware that she had a identical twin sister.  It certainly was interesting talking with both of them.   [name], MD 7/10/201912:47 PM

## 2018-01-29 LAB — KAPPA/LAMBDA LIGHT CHAINS
KAPPA FREE LGHT CHN: 3.5 mg/L (ref 3.3–19.4)
Kappa, lambda light chain ratio: 1.46 (ref 0.26–1.65)
Lambda free light chains: 2.4 mg/L — ABNORMAL LOW (ref 5.7–26.3)

## 2018-01-29 LAB — IGG, IGA, IGM
IGM (IMMUNOGLOBULIN M), SRM: 19 mg/dL — AB (ref 26–217)
IgA: 17 mg/dL — ABNORMAL LOW (ref 64–422)
IgG (Immunoglobin G), Serum: 320 mg/dL — ABNORMAL LOW (ref 700–1600)

## 2018-01-30 LAB — PROTEIN ELECTROPHORESIS, SERUM
A/G Ratio: 1.7 (ref 0.7–1.7)
ALBUMIN ELP: 3.9 g/dL (ref 2.9–4.4)
ALPHA-1-GLOBULIN: 0.2 g/dL (ref 0.0–0.4)
Alpha-2-Globulin: 0.7 g/dL (ref 0.4–1.0)
BETA GLOBULIN: 1 g/dL (ref 0.7–1.3)
GAMMA GLOBULIN: 0.3 g/dL — AB (ref 0.4–1.8)
Globulin, Total: 2.3 g/dL (ref 2.2–3.9)
TOTAL PROTEIN ELP: 6.2 g/dL (ref 6.0–8.5)

## 2018-02-08 ENCOUNTER — Other Ambulatory Visit: Payer: Self-pay | Admitting: Internal Medicine

## 2018-03-07 ENCOUNTER — Other Ambulatory Visit: Payer: Self-pay | Admitting: Internal Medicine

## 2018-03-11 ENCOUNTER — Inpatient Hospital Stay: Payer: PPO

## 2018-03-11 ENCOUNTER — Other Ambulatory Visit: Payer: Self-pay

## 2018-03-11 ENCOUNTER — Encounter: Payer: Self-pay | Admitting: Hematology & Oncology

## 2018-03-11 ENCOUNTER — Inpatient Hospital Stay: Payer: PPO | Attending: Hematology & Oncology | Admitting: Hematology & Oncology

## 2018-03-11 VITALS — BP 104/54 | HR 72 | Temp 97.9°F | Resp 20 | Wt 119.4 lb

## 2018-03-11 DIAGNOSIS — C9000 Multiple myeloma not having achieved remission: Secondary | ICD-10-CM

## 2018-03-11 DIAGNOSIS — Z5112 Encounter for antineoplastic immunotherapy: Secondary | ICD-10-CM | POA: Diagnosis not present

## 2018-03-11 LAB — CMP (CANCER CENTER ONLY)
ALT: 27 U/L (ref 10–47)
ANION GAP: 8 (ref 5–15)
AST: 25 U/L (ref 11–38)
Albumin: 3.5 g/dL (ref 3.5–5.0)
Alkaline Phosphatase: 64 U/L (ref 26–84)
BUN: 15 mg/dL (ref 7–22)
CALCIUM: 9 mg/dL (ref 8.0–10.3)
CO2: 26 mmol/L (ref 18–33)
Chloride: 108 mmol/L (ref 98–108)
Creatinine: 0.8 mg/dL (ref 0.60–1.20)
GLUCOSE: 122 mg/dL — AB (ref 73–118)
Potassium: 3.6 mmol/L (ref 3.3–4.7)
SODIUM: 142 mmol/L (ref 128–145)
TOTAL PROTEIN: 6.1 g/dL — AB (ref 6.4–8.1)
Total Bilirubin: 0.8 mg/dL (ref 0.2–1.6)

## 2018-03-11 LAB — CBC WITH DIFFERENTIAL (CANCER CENTER ONLY)
BASOS ABS: 0 10*3/uL (ref 0.0–0.1)
Basophils Relative: 0 %
EOS ABS: 0 10*3/uL (ref 0.0–0.5)
EOS PCT: 1 %
HCT: 38.9 % (ref 34.8–46.6)
Hemoglobin: 13.3 g/dL (ref 11.6–15.9)
Lymphocytes Relative: 11 %
Lymphs Abs: 0.9 10*3/uL (ref 0.9–3.3)
MCH: 33.8 pg (ref 26.0–34.0)
MCHC: 34.2 g/dL (ref 32.0–36.0)
MCV: 99 fL (ref 81.0–101.0)
MONO ABS: 0.7 10*3/uL (ref 0.1–0.9)
Monocytes Relative: 9 %
Neutro Abs: 6.1 10*3/uL (ref 1.5–6.5)
Neutrophils Relative %: 79 %
PLATELETS: 223 10*3/uL (ref 145–400)
RBC: 3.93 MIL/uL (ref 3.70–5.32)
RDW: 12.3 % (ref 11.1–15.7)
WBC: 7.7 10*3/uL (ref 3.9–10.0)

## 2018-03-11 MED ORDER — BORTEZOMIB CHEMO SQ INJECTION 3.5 MG (2.5MG/ML)
1.3000 mg/m2 | Freq: Once | INTRAMUSCULAR | Status: AC
  Administered 2018-03-11: 2 mg via SUBCUTANEOUS
  Filled 2018-03-11: qty 0.8

## 2018-03-11 NOTE — Progress Notes (Signed)
Hematology and Oncology Follow Up Visit  Debra Ruiz [id number] 06/22/1946 71 y.o. 03/11/2018   Principle Diagnosis:  IgG Kappa myeloma - compression fractures - 13q-, +11, 14q+ - hyperdiploid  Completed Therapy: S/p Kyphoplasty of T11 and T12 XRT to lower spine-completed on 09/25/2016  Current Therapy:   Velcade/Decadron -status post cycle #17 - Revlimid stopped on 11/06/2016 Xgeva 120mg Sq q  3 month - next dose in October 2019   Interim History:  Debra Ruiz is here today for follow-up.  So far, everything is going okay.  She and her husband are under little bit stress right now as they are having part of their house redone so that a living room can be made into a bedroom.  [place]  They enjoyed the ocean.  Her myeloma has would not cause any problems for her.  She currently is doing some physical therapy with a personal trainer which I really applaud her for doing.  Her last myeloma studies back in July did not show a monoclonal spike.  Her IgG level was 320 mg/dL.  Her Kappa light chain was 3.5 mg/dL.  Her appetite is doing okay.  She is had no nausea or vomiting.  She had no change in bowel or bladder habits.  She has had no cough.  Overall, her performance status is ECOG 1.  Medications:  Allergies as of 03/11/2018      Reactions   Crestor [rosuvastatin Calcium]    Causes liver functions to be elevated      Medication List        Accurate as of 03/11/18  2:08 PM. Always use your most recent med list.          ALPRAZolam 0.25 MG tablet Commonly known as:  XANAX TAKE 1 TABLET BY MOUTH TWICE DAILY AS NEEDED FOR ANXIETY   amLODipine 5 MG tablet Commonly known as:  NORVASC TAKE 1 TABLET(5 MG) BY MOUTH DAILY   aspirin EC 81 MG tablet Take 81 mg by mouth 2 (two) times a week.   atorvastatin 20 MG tablet Commonly known as:  LIPITOR TAKE 1 TABLET(20 MG) BY MOUTH DAILY   Baclofen 5 MG Tabs TK 1 T PO TID PRN   BYSTOLIC 5 MG  tablet Generic drug:  nebivolol TAKE 1 TABLET(5 MG) BY MOUTH DAILY   dexamethasone 4 MG tablet Commonly known as:  DECADRON Take 3 pills with food once a week   escitalopram 20 MG tablet Commonly known as:  LEXAPRO TAKE 1 TABLET(20 MG) BY MOUTH DAILY   famciclovir 250 MG tablet Commonly known as:  FAMVIR Take 1 tablet (250 mg total) by mouth daily.   Melatonin 10 MG Tabs Take 2 tablets by mouth daily.   meloxicam 15 MG tablet Commonly known as:  MOBIC TK 1 T PO QD WF PRN   potassium chloride SA 20 MEQ tablet Commonly known as:  K-DUR,KLOR-CON TAKE 1 TABLET(20 MEQ) BY MOUTH DAILY   PREVNAR 13 Susp injection Generic drug:  pneumococcal 13-valent conjugate vaccine ADM 0.5ML IM UTD   prochlorperazine 10 MG tablet Commonly known as:  COMPAZINE TAKE 1 TABLET(10 MG) BY MOUTH EVERY 6 HOURS AS NEEDED FOR NAUSEA OR VOMITING   valACYclovir 1000 MG tablet Commonly known as:  VALTREX Take by mouth.   Vitamin D (Ergocalciferol) 2000 units Caps Take 2,000 Units by mouth daily.   Vitamin D 2000 units Caps Take 2,000 Units by mouth daily.         Allergies:  Allergies  Allergen Reactions  . Crestor [Rosuvastatin Calcium]     Causes liver functions to be elevated    Past Medical History, Surgical history, Social history, and Family History were reviewed and updated.  Review of Systems: Review of Systems  Constitutional: Negative.   HENT: Negative.   Eyes: Negative.   Respiratory: Negative.   Cardiovascular: Negative.   Gastrointestinal: Negative.   Genitourinary: Negative.   Musculoskeletal: Positive for back pain.  Skin: Negative.   Neurological: Negative.   Endo/Heme/Allergies: Negative.   Psychiatric/Behavioral: Negative.     Physical Exam:  weight is 119 lb 6.4 oz (54.2 kg). Her oral temperature is 97.9 F (36.6 C). Her blood pressure is 104/54 (abnormal) and her pulse is 72. Her respiration is 20 and oxygen saturation is 99%.   Wt Readings from Last 3  Encounters:  03/11/18 119 lb 6.4 oz (54.2 kg)  01/28/18 118 lb 6.4 oz (53.7 kg)  12/17/17 115 lb 12 oz (52.5 kg)    Physical Exam  Constitutional: She is oriented to person, place, and time.  HENT:  Head: Normocephalic and atraumatic.  Mouth/Throat: Oropharynx is clear and moist.  Eyes: Pupils are equal, round, and reactive to light. EOM are normal.  Neck: Normal range of motion.  Cardiovascular: Normal rate, regular rhythm and normal heart sounds.  Pulmonary/Chest: Effort normal and breath sounds normal.  Abdominal: Soft. Bowel sounds are normal.  Musculoskeletal: Normal range of motion. She exhibits no edema, tenderness or deformity.  Lymphadenopathy:    She has no cervical adenopathy.  Neurological: She is alert and oriented to person, place, and time.  Skin: Skin is warm and dry. No rash noted. No erythema.  Psychiatric: She has a normal mood and affect. Her behavior is normal. Judgment and thought content normal.  Vitals reviewed.     Lab Results  Component Value Date   WBC 7.7 03/11/2018   HGB 13.3 03/11/2018   HCT 38.9 03/11/2018   MCV 99.0 03/11/2018   PLT 223 03/11/2018   No results found for: FERRITIN, IRON, TIBC, UIBC, IRONPCTSAT Lab Results  Component Value Date   RBC 3.93 03/11/2018   Lab Results  Component Value Date   KPAFRELGTCHN 3.5 01/28/2018   LAMBDASER 2.4 (L) 01/28/2018   KAPLAMBRATIO 1.46 01/28/2018   Lab Results  Component Value Date   IGGSERUM 320 (L) 01/28/2018   IGA 17 (L) 01/28/2018   IGMSERUM 19 (L) 01/28/2018   Lab Results  Component Value Date   TOTALPROTELP 6.2 01/28/2018   ALBUMINELP 3.9 01/28/2018   A1GS 0.2 01/28/2018   A2GS 0.7 01/28/2018   BETS 1.0 01/28/2018   GAMS 0.3 (L) 01/28/2018   MSPIKE Not Observed 01/28/2018   SPEI Comment 01/28/2018     Chemistry      Component Value Date/Time   NA 142 03/11/2018 1301   NA 148 (H) 07/16/2017 1128   NA 143 03/05/2017 1051   K 3.6 03/11/2018 1301   K 3.8 07/16/2017 1128     K 3.7 03/05/2017 1051   CL 108 03/11/2018 1301   CL 106 07/16/2017 1128   CO2 26 03/11/2018 1301   CO2 29 07/16/2017 1128   CO2 25 03/05/2017 1051   BUN 15 03/11/2018 1301   BUN 23 (H) 07/16/2017 1128   BUN 18.2 03/05/2017 1051   CREATININE 0.80 03/11/2018 1301   CREATININE 0.76 11/03/2017 0919   CREATININE 0.8 03/05/2017 1051      Component Value Date/Time   CALCIUM 9.0 03/11/2018  1301   CALCIUM 9.7 07/16/2017 1128   CALCIUM 8.9 03/05/2017 1051   ALKPHOS 64 03/11/2018 1301   ALKPHOS 71 07/16/2017 1128   ALKPHOS 67 03/05/2017 1051   AST 25 03/11/2018 1301   AST 15 03/05/2017 1051   ALT 27 03/11/2018 1301   ALT 23 07/16/2017 1128   ALT 17 03/05/2017 1051   BILITOT 0.8 03/11/2018 1301   BILITOT 0.55 03/05/2017 1051      Impression and Plan: Ms. Lubinski is a very pleasant 72 yo caucasian female with IgG Kappa Myeloma. She has done well with Velcade and has no complaints at this time. Her counts have remained stable.   Everything is going quite well.  We will continue the Velcade.  I think every 6-week appointment is doing well for her.  We will see her back in October, she will get her Delton See.  I hope that she will continue to exercise.  I know that she will eat well.Marland Kitchen   [name], MD 8/21/20192:08 PM

## 2018-03-12 LAB — PROTEIN ELECTROPHORESIS, SERUM, WITH REFLEX
A/G RATIO SPE: 1.9 — AB (ref 0.7–1.7)
ALBUMIN ELP: 3.7 g/dL (ref 2.9–4.4)
Alpha-1-Globulin: 0.2 g/dL (ref 0.0–0.4)
Alpha-2-Globulin: 0.7 g/dL (ref 0.4–1.0)
BETA GLOBULIN: 0.9 g/dL (ref 0.7–1.3)
GLOBULIN, TOTAL: 2 g/dL — AB (ref 2.2–3.9)
Gamma Globulin: 0.2 g/dL — ABNORMAL LOW (ref 0.4–1.8)
Total Protein ELP: 5.7 g/dL — ABNORMAL LOW (ref 6.0–8.5)

## 2018-03-12 LAB — IGG, IGA, IGM
IgA: 16 mg/dL — ABNORMAL LOW (ref 64–422)
IgG (Immunoglobin G), Serum: 323 mg/dL — ABNORMAL LOW (ref 700–1600)
IgM (Immunoglobulin M), Srm: 26 mg/dL (ref 26–217)

## 2018-03-12 LAB — KAPPA/LAMBDA LIGHT CHAINS
KAPPA, LAMDA LIGHT CHAIN RATIO: 1.8 — AB (ref 0.26–1.65)
Kappa free light chain: 4.5 mg/L (ref 3.3–19.4)
Lambda free light chains: 2.5 mg/L — ABNORMAL LOW (ref 5.7–26.3)

## 2018-03-20 DIAGNOSIS — M546 Pain in thoracic spine: Secondary | ICD-10-CM | POA: Diagnosis not present

## 2018-03-20 DIAGNOSIS — M545 Low back pain: Secondary | ICD-10-CM | POA: Diagnosis not present

## 2018-03-31 DIAGNOSIS — M47816 Spondylosis without myelopathy or radiculopathy, lumbar region: Secondary | ICD-10-CM | POA: Diagnosis not present

## 2018-03-31 DIAGNOSIS — M6281 Muscle weakness (generalized): Secondary | ICD-10-CM | POA: Diagnosis not present

## 2018-03-31 DIAGNOSIS — M47814 Spondylosis without myelopathy or radiculopathy, thoracic region: Secondary | ICD-10-CM | POA: Diagnosis not present

## 2018-04-01 DIAGNOSIS — M47814 Spondylosis without myelopathy or radiculopathy, thoracic region: Secondary | ICD-10-CM | POA: Diagnosis not present

## 2018-04-01 DIAGNOSIS — M47816 Spondylosis without myelopathy or radiculopathy, lumbar region: Secondary | ICD-10-CM | POA: Diagnosis not present

## 2018-04-01 DIAGNOSIS — M6281 Muscle weakness (generalized): Secondary | ICD-10-CM | POA: Diagnosis not present

## 2018-04-03 DIAGNOSIS — M6281 Muscle weakness (generalized): Secondary | ICD-10-CM | POA: Diagnosis not present

## 2018-04-03 DIAGNOSIS — M47814 Spondylosis without myelopathy or radiculopathy, thoracic region: Secondary | ICD-10-CM | POA: Diagnosis not present

## 2018-04-03 DIAGNOSIS — M47816 Spondylosis without myelopathy or radiculopathy, lumbar region: Secondary | ICD-10-CM | POA: Diagnosis not present

## 2018-04-06 DIAGNOSIS — M47816 Spondylosis without myelopathy or radiculopathy, lumbar region: Secondary | ICD-10-CM | POA: Diagnosis not present

## 2018-04-06 DIAGNOSIS — M47814 Spondylosis without myelopathy or radiculopathy, thoracic region: Secondary | ICD-10-CM | POA: Diagnosis not present

## 2018-04-06 DIAGNOSIS — M6281 Muscle weakness (generalized): Secondary | ICD-10-CM | POA: Diagnosis not present

## 2018-04-08 DIAGNOSIS — M47816 Spondylosis without myelopathy or radiculopathy, lumbar region: Secondary | ICD-10-CM | POA: Diagnosis not present

## 2018-04-08 DIAGNOSIS — M6281 Muscle weakness (generalized): Secondary | ICD-10-CM | POA: Diagnosis not present

## 2018-04-08 DIAGNOSIS — M47814 Spondylosis without myelopathy or radiculopathy, thoracic region: Secondary | ICD-10-CM | POA: Diagnosis not present

## 2018-04-14 DIAGNOSIS — M47814 Spondylosis without myelopathy or radiculopathy, thoracic region: Secondary | ICD-10-CM | POA: Diagnosis not present

## 2018-04-14 DIAGNOSIS — M6281 Muscle weakness (generalized): Secondary | ICD-10-CM | POA: Diagnosis not present

## 2018-04-14 DIAGNOSIS — M47816 Spondylosis without myelopathy or radiculopathy, lumbar region: Secondary | ICD-10-CM | POA: Diagnosis not present

## 2018-04-16 DIAGNOSIS — M47816 Spondylosis without myelopathy or radiculopathy, lumbar region: Secondary | ICD-10-CM | POA: Diagnosis not present

## 2018-04-16 DIAGNOSIS — M6281 Muscle weakness (generalized): Secondary | ICD-10-CM | POA: Diagnosis not present

## 2018-04-16 DIAGNOSIS — M47814 Spondylosis without myelopathy or radiculopathy, thoracic region: Secondary | ICD-10-CM | POA: Diagnosis not present

## 2018-04-17 ENCOUNTER — Other Ambulatory Visit: Payer: Self-pay | Admitting: Internal Medicine

## 2018-04-22 ENCOUNTER — Inpatient Hospital Stay: Payer: PPO

## 2018-04-22 ENCOUNTER — Encounter: Payer: Self-pay | Admitting: Hematology & Oncology

## 2018-04-22 ENCOUNTER — Other Ambulatory Visit: Payer: Self-pay

## 2018-04-22 ENCOUNTER — Inpatient Hospital Stay: Payer: PPO | Attending: Hematology & Oncology | Admitting: Hematology & Oncology

## 2018-04-22 VITALS — BP 113/74 | HR 75 | Temp 97.6°F | Resp 20 | Wt 118.4 lb

## 2018-04-22 DIAGNOSIS — C9000 Multiple myeloma not having achieved remission: Secondary | ICD-10-CM

## 2018-04-22 DIAGNOSIS — H02402 Unspecified ptosis of left eyelid: Secondary | ICD-10-CM | POA: Diagnosis not present

## 2018-04-22 DIAGNOSIS — Z5112 Encounter for antineoplastic immunotherapy: Secondary | ICD-10-CM | POA: Diagnosis not present

## 2018-04-22 LAB — CMP (CANCER CENTER ONLY)
ALK PHOS: 66 U/L (ref 26–84)
ALT: 24 U/L (ref 10–47)
AST: 23 U/L (ref 11–38)
Albumin: 3.9 g/dL (ref 3.5–5.0)
Anion gap: 5 (ref 5–15)
BUN: 17 mg/dL (ref 7–22)
CALCIUM: 9.2 mg/dL (ref 8.0–10.3)
CO2: 30 mmol/L (ref 18–33)
CREATININE: 0.8 mg/dL (ref 0.60–1.20)
Chloride: 108 mmol/L (ref 98–108)
Glucose, Bld: 109 mg/dL (ref 73–118)
Potassium: 4.3 mmol/L (ref 3.3–4.7)
SODIUM: 143 mmol/L (ref 128–145)
Total Bilirubin: 0.9 mg/dL (ref 0.2–1.6)
Total Protein: 6.5 g/dL (ref 6.4–8.1)

## 2018-04-22 LAB — CBC WITH DIFFERENTIAL (CANCER CENTER ONLY)
BASOS PCT: 0 %
Basophils Absolute: 0 10*3/uL (ref 0.0–0.1)
EOS ABS: 0.1 10*3/uL (ref 0.0–0.5)
Eosinophils Relative: 1 %
HCT: 42.3 % (ref 34.8–46.6)
HEMOGLOBIN: 14.2 g/dL (ref 11.6–15.9)
Lymphocytes Relative: 16 %
Lymphs Abs: 1.1 10*3/uL (ref 0.9–3.3)
MCH: 33 pg (ref 26.0–34.0)
MCHC: 33.6 g/dL (ref 32.0–36.0)
MCV: 98.4 fL (ref 81.0–101.0)
MONO ABS: 0.8 10*3/uL (ref 0.1–0.9)
Monocytes Relative: 12 %
NEUTROS ABS: 5.2 10*3/uL (ref 1.5–6.5)
Neutrophils Relative %: 71 %
Platelet Count: 222 10*3/uL (ref 145–400)
RBC: 4.3 MIL/uL (ref 3.70–5.32)
RDW: 12.8 % (ref 11.1–15.7)
WBC: 7.2 10*3/uL (ref 3.9–10.0)

## 2018-04-22 MED ORDER — BORTEZOMIB CHEMO SQ INJECTION 3.5 MG (2.5MG/ML)
1.3000 mg/m2 | Freq: Once | INTRAMUSCULAR | Status: AC
  Administered 2018-04-22: 2 mg via SUBCUTANEOUS
  Filled 2018-04-22: qty 0.8

## 2018-04-22 MED ORDER — DENOSUMAB 120 MG/1.7ML ~~LOC~~ SOLN
SUBCUTANEOUS | Status: AC
  Filled 2018-04-22: qty 1.7

## 2018-04-22 MED ORDER — PROCHLORPERAZINE MALEATE 10 MG PO TABS: 10.0000 mg | ORAL_TABLET | Freq: Once | ORAL | Status: DC

## 2018-04-22 MED ORDER — PROCHLORPERAZINE MALEATE 10 MG PO TABS
ORAL_TABLET | ORAL | Status: AC
  Filled 2018-04-22: qty 1

## 2018-04-22 MED ORDER — DENOSUMAB 120 MG/1.7ML ~~LOC~~ SOLN
120.0000 mg | Freq: Once | SUBCUTANEOUS | Status: AC
  Administered 2018-04-22: 120 mg via SUBCUTANEOUS

## 2018-04-22 NOTE — Progress Notes (Signed)
Hematology and Oncology Follow Up Visit  Debra Ruiz [id number] 1946-05-15 72 y.o. 04/22/2018   Principle Diagnosis:  IgG Kappa myeloma - compression fractures - 13q-, +11, 14q+ - hyperdiploid  Completed Therapy: S/p Kyphoplasty of T11 and T12 XRT to lower spine-completed on 09/25/2016  Current Therapy:   Velcade/Decadron -status post cycle #17 - Revlimid stopped on 11/06/2016 Xgeva 127m Sq q  3 month - next dose in January 2020   Interim History:  Ms. JHopwoodis here today for follow-up.  So far, everything is going okay.  She and her husband are under little bit stress right now as they are having part of their house redone so that a living room can be made into a bedroom.  She is now on physical therapy for her back.  She is enjoying this.  She says that it is making things better for her.  She is able to do more things.  Thankfully, her myeloma really has not been a problem.  Every time we check her myeloma studies, there is no M spike that is found.  Her IgG level was 323 mg/dL.  The kappa light chain was 0.5 mg/dL.  She is eating well.  She is having no problems with bowels or bladder.  She is having no rashes.  There is no cough.  She is had no headache.  She is had no fever.  Overall, her performance status is ECOG 1.   Medications:  Allergies as of 04/22/2018      Reactions   Crestor [rosuvastatin Calcium]    Causes liver functions to be elevated      Medication List        Accurate as of 04/22/18 12:51 PM. Always use your most recent med list.          ALPRAZolam 0.25 MG tablet Commonly known as:  XANAX TAKE 1 TABLET BY MOUTH TWICE DAILY AS NEEDED FOR ANXIETY   amLODipine 5 MG tablet Commonly known as:  NORVASC TAKE 1 TABLET(5 MG) BY MOUTH DAILY   aspirin EC 81 MG tablet Take 81 mg by mouth 2 (two) times a week.   atorvastatin 20 MG tablet Commonly known as:  LIPITOR TAKE 1 TABLET(20 MG) BY MOUTH DAILY   Baclofen 5 MG Tabs TK 1 T PO TID PRN     BYSTOLIC 5 MG tablet Generic drug:  nebivolol TAKE 1 TABLET(5 MG) BY MOUTH DAILY   dexamethasone 4 MG tablet Commonly known as:  DECADRON Take 3 pills with food once a week   escitalopram 20 MG tablet Commonly known as:  LEXAPRO TAKE 1 TABLET(20 MG) BY MOUTH DAILY   famciclovir 250 MG tablet Commonly known as:  FAMVIR Take 1 tablet (250 mg total) by mouth daily.   Melatonin 10 MG Tabs Take 2 tablets by mouth daily.   meloxicam 15 MG tablet Commonly known as:  MOBIC TK 1 T PO QD WF PRN   potassium chloride SA 20 MEQ tablet Commonly known as:  K-DUR,KLOR-CON TAKE 1 TABLET(20 MEQ) BY MOUTH DAILY   PREVNAR 13 Susp injection Generic drug:  pneumococcal 13-valent conjugate vaccine ADM 0.5ML IM UTD   prochlorperazine 10 MG tablet Commonly known as:  COMPAZINE TAKE 1 TABLET(10 MG) BY MOUTH EVERY 6 HOURS AS NEEDED FOR NAUSEA OR VOMITING   tiZANidine 2 MG tablet Commonly known as:  ZANAFLEX TK 1 TO 2 TS PO Q 8 H FOR SPASM   valACYclovir 1000 MG tablet Commonly known as:  VALTREX Take by  mouth.   Vitamin D (Ergocalciferol) 2000 units Caps Take 2,000 Units by mouth daily.   Vitamin D 2000 units Caps Take 2,000 Units by mouth daily.       Allergies:  Allergies  Allergen Reactions  . Crestor [Rosuvastatin Calcium]     Causes liver functions to be elevated    Past Medical History, Surgical history, Social history, and Family History were reviewed and updated.  Review of Systems: Review of Systems  Constitutional: Negative.   HENT: Negative.   Eyes: Negative.   Respiratory: Negative.   Cardiovascular: Negative.   Gastrointestinal: Negative.   Genitourinary: Negative.   Musculoskeletal: Positive for back pain.  Skin: Negative.   Neurological: Negative.   Endo/Heme/Allergies: Negative.   Psychiatric/Behavioral: Negative.     Physical Exam:  weight is 118 lb 6.4 oz (53.7 kg). Her oral temperature is 97.6 F (36.4 C). Her blood pressure is 113/74 and her  pulse is 75. Her respiration is 20 and oxygen saturation is 100%.   Wt Readings from Last 3 Encounters:  04/22/18 118 lb 6.4 oz (53.7 kg)  03/11/18 119 lb 6.4 oz (54.2 kg)  01/28/18 118 lb 6.4 oz (53.7 kg)    Physical Exam  Constitutional: She is oriented to person, place, and time.  HENT:  Head: Normocephalic and atraumatic.  Mouth/Throat: Oropharynx is clear and moist.  Eyes: Pupils are equal, round, and reactive to light. EOM are normal.  Neck: Normal range of motion.  Cardiovascular: Normal rate, regular rhythm and normal heart sounds.  Pulmonary/Chest: Effort normal and breath sounds normal.  Abdominal: Soft. Bowel sounds are normal.  Musculoskeletal: Normal range of motion. She exhibits no edema, tenderness or deformity.  Lymphadenopathy:    She has no cervical adenopathy.  Neurological: She is alert and oriented to person, place, and time.  Skin: Skin is warm and dry. No rash noted. No erythema.  Psychiatric: She has a normal mood and affect. Her behavior is normal. Judgment and thought content normal.  Vitals reviewed.     Lab Results  Component Value Date   WBC 7.2 04/22/2018   HGB 14.2 04/22/2018   HCT 42.3 04/22/2018   MCV 98.4 04/22/2018   PLT 222 04/22/2018   No results found for: FERRITIN, IRON, TIBC, UIBC, IRONPCTSAT Lab Results  Component Value Date   RBC 4.30 04/22/2018   Lab Results  Component Value Date   KPAFRELGTCHN 4.5 03/11/2018   LAMBDASER 2.5 (L) 03/11/2018   KAPLAMBRATIO 1.80 (H) 03/11/2018   Lab Results  Component Value Date   IGGSERUM 323 (L) 03/11/2018   IGA 16 (L) 03/11/2018   IGMSERUM 26 03/11/2018   Lab Results  Component Value Date   TOTALPROTELP 5.7 (L) 03/11/2018   ALBUMINELP 3.7 03/11/2018   A1GS 0.2 03/11/2018   A2GS 0.7 03/11/2018   BETS 0.9 03/11/2018   GAMS 0.2 (L) 03/11/2018   MSPIKE Not Observed 03/11/2018   SPEI Comment 01/28/2018     Chemistry      Component Value Date/Time   NA 143 04/22/2018 1131   NA  148 (H) 07/16/2017 1128   NA 143 03/05/2017 1051   K 4.3 04/22/2018 1131   K 3.8 07/16/2017 1128   K 3.7 03/05/2017 1051   CL 108 04/22/2018 1131   CL 106 07/16/2017 1128   CO2 30 04/22/2018 1131   CO2 29 07/16/2017 1128   CO2 25 03/05/2017 1051   BUN 17 04/22/2018 1131   BUN 23 (H) 07/16/2017 1128   BUN 18.2  03/05/2017 1051   CREATININE 0.80 04/22/2018 1131   CREATININE 0.76 11/03/2017 0919   CREATININE 0.8 03/05/2017 1051      Component Value Date/Time   CALCIUM 9.2 04/22/2018 1131   CALCIUM 9.7 07/16/2017 1128   CALCIUM 8.9 03/05/2017 1051   ALKPHOS 66 04/22/2018 1131   ALKPHOS 71 07/16/2017 1128   ALKPHOS 67 03/05/2017 1051   AST 23 04/22/2018 1131   AST 15 03/05/2017 1051   ALT 24 04/22/2018 1131   ALT 23 07/16/2017 1128   ALT 17 03/05/2017 1051   BILITOT 0.9 04/22/2018 1131   BILITOT 0.55 03/05/2017 1051      Impression and Plan: Ms. Panjwani is a very pleasant 72 yo caucasian female with IgG Kappa Myeloma. She has done well with Velcade and has no complaints at this time. Her counts have remained stable.   I think that the every 6-week scheduling is perfect for her.  She is able to do what she likes to do.  She is active.  Both she and her husband do a lot of traveling.  Hopefully, they are remodeling of the home will be done by the time we see her back.  I will see her back in 6 weeks.  We will get her back right before the Thanksgiving holiday.  After that, we will then get her back after New Year's.  She will get her Delton See today.  [name], MD 10/2/201912:51 PM

## 2018-04-22 NOTE — Patient Instructions (Signed)

## 2018-04-23 LAB — PROTEIN ELECTROPHORESIS, SERUM, WITH REFLEX
A/G RATIO SPE: 1.6 (ref 0.7–1.7)
Albumin ELP: 3.7 g/dL (ref 2.9–4.4)
Alpha-1-Globulin: 0.2 g/dL (ref 0.0–0.4)
Alpha-2-Globulin: 0.8 g/dL (ref 0.4–1.0)
BETA GLOBULIN: 1 g/dL (ref 0.7–1.3)
GLOBULIN, TOTAL: 2.3 g/dL (ref 2.2–3.9)
Gamma Globulin: 0.3 g/dL — ABNORMAL LOW (ref 0.4–1.8)
TOTAL PROTEIN ELP: 6 g/dL (ref 6.0–8.5)

## 2018-04-23 LAB — KAPPA/LAMBDA LIGHT CHAINS
KAPPA, LAMDA LIGHT CHAIN RATIO: 1.97 — AB (ref 0.26–1.65)
Kappa free light chain: 6.5 mg/L (ref 3.3–19.4)
LAMDA FREE LIGHT CHAINS: 3.3 mg/L — AB (ref 5.7–26.3)

## 2018-04-23 LAB — IGG, IGA, IGM
IgA: 21 mg/dL — ABNORMAL LOW (ref 64–422)
IgG (Immunoglobin G), Serum: 360 mg/dL — ABNORMAL LOW (ref 700–1600)
IgM (Immunoglobulin M), Srm: 31 mg/dL (ref 26–217)

## 2018-04-24 ENCOUNTER — Telehealth: Payer: Self-pay | Admitting: *Deleted

## 2018-04-24 NOTE — Telephone Encounter (Addendum)
Patient is aware of results  ----- Message from [name], MD sent at 04/23/2018  5:29 PM EDT ----- Call - no myeloma in the blood!!  [name]

## 2018-04-29 DIAGNOSIS — M47816 Spondylosis without myelopathy or radiculopathy, lumbar region: Secondary | ICD-10-CM | POA: Diagnosis not present

## 2018-04-29 DIAGNOSIS — M6281 Muscle weakness (generalized): Secondary | ICD-10-CM | POA: Diagnosis not present

## 2018-04-29 DIAGNOSIS — M47814 Spondylosis without myelopathy or radiculopathy, thoracic region: Secondary | ICD-10-CM | POA: Diagnosis not present

## 2018-04-30 DIAGNOSIS — H25813 Combined forms of age-related cataract, bilateral: Secondary | ICD-10-CM | POA: Diagnosis not present

## 2018-04-30 DIAGNOSIS — H43813 Vitreous degeneration, bilateral: Secondary | ICD-10-CM | POA: Diagnosis not present

## 2018-04-30 DIAGNOSIS — M47814 Spondylosis without myelopathy or radiculopathy, thoracic region: Secondary | ICD-10-CM | POA: Diagnosis not present

## 2018-04-30 DIAGNOSIS — M47816 Spondylosis without myelopathy or radiculopathy, lumbar region: Secondary | ICD-10-CM | POA: Diagnosis not present

## 2018-04-30 DIAGNOSIS — M6281 Muscle weakness (generalized): Secondary | ICD-10-CM | POA: Diagnosis not present

## 2018-04-30 DIAGNOSIS — H524 Presbyopia: Secondary | ICD-10-CM | POA: Diagnosis not present

## 2018-04-30 DIAGNOSIS — H1789 Other corneal scars and opacities: Secondary | ICD-10-CM | POA: Diagnosis not present

## 2018-05-01 ENCOUNTER — Other Ambulatory Visit: Payer: Self-pay

## 2018-05-01 ENCOUNTER — Inpatient Hospital Stay (HOSPITAL_BASED_OUTPATIENT_CLINIC_OR_DEPARTMENT_OTHER): Payer: PPO | Admitting: Family

## 2018-05-01 ENCOUNTER — Encounter: Payer: Self-pay | Admitting: Family

## 2018-05-01 ENCOUNTER — Ambulatory Visit (HOSPITAL_BASED_OUTPATIENT_CLINIC_OR_DEPARTMENT_OTHER)
Admission: RE | Admit: 2018-05-01 | Discharge: 2018-05-01 | Disposition: A | Discharge status: Home / Self Care | Payer: PPO | Source: Ambulatory Visit | Attending: Family | Admitting: Family

## 2018-05-01 VITALS — BP 133/67 | HR 89 | Temp 98.7°F | Resp 18 | Wt 119.0 lb

## 2018-05-01 DIAGNOSIS — H02536 Eyelid retraction left eye, unspecified eyelid: Secondary | ICD-10-CM | POA: Insufficient documentation

## 2018-05-01 DIAGNOSIS — C9000 Multiple myeloma not having achieved remission: Secondary | ICD-10-CM | POA: Insufficient documentation

## 2018-05-01 DIAGNOSIS — J9811 Atelectasis: Secondary | ICD-10-CM | POA: Diagnosis not present

## 2018-05-01 DIAGNOSIS — I517 Cardiomegaly: Secondary | ICD-10-CM | POA: Insufficient documentation

## 2018-05-01 DIAGNOSIS — H02402 Unspecified ptosis of left eyelid: Secondary | ICD-10-CM | POA: Diagnosis not present

## 2018-05-01 DIAGNOSIS — Z5112 Encounter for antineoplastic immunotherapy: Secondary | ICD-10-CM | POA: Diagnosis not present

## 2018-05-01 NOTE — Progress Notes (Signed)
Hematology and Oncology Follow Up Visit  Debra Ruiz [id number] 1946-07-19 72 y.o. 05/01/2018   Principle Diagnosis:  IgG Kappa myeloma - compression fractures - 13q-, +11, 14q+ - hyperdiploid  Completed Therapy: S/p Kyphoplasty of T11 and T12 XRT to lower spine-completed on 09/25/2016  Current Therapy:   Velcade/Decadron -status post cycle 19 - Revlimid stopped on 11/06/2016 Xgeva 18m Sq q 3 month - next dose in January 2020   Interim History:  Ms. Debra Ruiz here today with her husband with complaint of her "eye lid feeling heavy". She woke up with this 2 days ago and states that she feels like she has a "film" over her eye but no loss of vision. She saw her eye doctor yesterday and they were concerned that she might have Horner's syndrome.  Her left pupil reaction to light is sluggish, right pupil normally reactive to light.  She has no other left sided deficit.  Her face does not appear to be dry.  She has some sinus congestion and drainage and states that her nose and left eye are watery/runny.  Both she and her husband deny her having any changes in speech or confusion.  No fever, chills, n/v, cough, rash, dizziness, headache, SOB, chest pain, palpitations, abdominal pain or changes in bowel or bladder habits.  No weakness, swelling, tenderness, numbness or tingling in her extremities.  No lymphadenopathy noted on exam.  She is eating well and is staying well hydrated. Her weight is stable.   ECOG Performance Status: 1 - Symptomatic but completely ambulatory  Medications:  Allergies as of 05/01/2018      Reactions   Crestor [rosuvastatin Calcium]    Causes liver functions to be elevated      Medication List        Accurate as of 05/01/18  2:10 PM. Always use your most recent med list.          ALPRAZolam 0.25 MG tablet Commonly known as:  XANAX TAKE 1 TABLET BY MOUTH TWICE DAILY AS NEEDED FOR ANXIETY   amLODipine 5 MG tablet Commonly known as:   NORVASC TAKE 1 TABLET(5 MG) BY MOUTH DAILY   aspirin EC 81 MG tablet Take 81 mg by mouth 2 (two) times a week.   atorvastatin 20 MG tablet Commonly known as:  LIPITOR TAKE 1 TABLET(20 MG) BY MOUTH DAILY   Baclofen 5 MG Tabs TK 1 T PO TID PRN   BYSTOLIC 5 MG tablet Generic drug:  nebivolol TAKE 1 TABLET(5 MG) BY MOUTH DAILY   dexamethasone 4 MG tablet Commonly known as:  DECADRON Take 3 pills with food once a week   escitalopram 20 MG tablet Commonly known as:  LEXAPRO TAKE 1 TABLET(20 MG) BY MOUTH DAILY   famciclovir 250 MG tablet Commonly known as:  FAMVIR Take 1 tablet (250 mg total) by mouth daily.   Melatonin 10 MG Tabs Take 2 tablets by mouth daily.   meloxicam 15 MG tablet Commonly known as:  MOBIC TK 1 T PO QD WF PRN   potassium chloride SA 20 MEQ tablet Commonly known as:  K-DUR,KLOR-CON TAKE 1 TABLET(20 MEQ) BY MOUTH DAILY   PREVNAR 13 Susp injection Generic drug:  pneumococcal 13-valent conjugate vaccine ADM 0.5ML IM UTD   prochlorperazine 10 MG tablet Commonly known as:  COMPAZINE TAKE 1 TABLET(10 MG) BY MOUTH EVERY 6 HOURS AS NEEDED FOR NAUSEA OR VOMITING   tiZANidine 2 MG tablet Commonly known as:  ZANAFLEX TK 1 TO 2 TS PO  Q 8 H FOR SPASM   valACYclovir 1000 MG tablet Commonly known as:  VALTREX Take by mouth.   Vitamin D (Ergocalciferol) 2000 units Caps Take 2,000 Units by mouth daily.   Vitamin D 2000 units Caps Take 2,000 Units by mouth daily.       Allergies:  Allergies  Allergen Reactions  . Crestor [Rosuvastatin Calcium]     Causes liver functions to be elevated    Past Medical History, Surgical history, Social history, and Family History were reviewed and updated.  Review of Systems: All other 10 point review of systems is negative.   Physical Exam:  vitals were not taken for this visit.   Wt Readings from Last 3 Encounters:  04/22/18 118 lb 6.4 oz (53.7 kg)  03/11/18 119 lb 6.4 oz (54.2 kg)  01/28/18 118 lb 6.4 oz  (53.7 kg)    Ocular: Sclerae unicteric, right pupil reactive, left pupil sluggish, left eye lid drooping  Ear-nose-throat: Oropharynx clear, dentition fair Lymphatic: No cervical, supraclavicular or axillary adenopathy Lungs no rales or rhonchi, good excursion bilaterally Heart regular rate and rhythm, no murmur appreciated Abd soft, nontender, positive bowel sounds, no liver or spleen tip palpated on exam, no fluid wave MSK no focal spinal tenderness, no joint edema Neuro: non-focal, well-oriented, appropriate affect Breasts: Deferred   Lab Results  Component Value Date   WBC 7.2 04/22/2018   HGB 14.2 04/22/2018   HCT 42.3 04/22/2018   MCV 98.4 04/22/2018   PLT 222 04/22/2018   No results found for: FERRITIN, IRON, TIBC, UIBC, IRONPCTSAT Lab Results  Component Value Date   RBC 4.30 04/22/2018   Lab Results  Component Value Date   KPAFRELGTCHN 6.5 04/22/2018   LAMBDASER 3.3 (L) 04/22/2018   KAPLAMBRATIO 1.97 (H) 04/22/2018   Lab Results  Component Value Date   IGGSERUM 360 (L) 04/22/2018   IGA 21 (L) 04/22/2018   IGMSERUM 31 04/22/2018   Lab Results  Component Value Date   TOTALPROTELP 6.0 04/22/2018   ALBUMINELP 3.7 04/22/2018   A1GS 0.2 04/22/2018   A2GS 0.8 04/22/2018   BETS 1.0 04/22/2018   GAMS 0.3 (L) 04/22/2018   MSPIKE Not Observed 04/22/2018   SPEI Comment 01/28/2018     Chemistry      Component Value Date/Time   NA 143 04/22/2018 1131   NA 148 (H) 07/16/2017 1128   NA 143 03/05/2017 1051   K 4.3 04/22/2018 1131   K 3.8 07/16/2017 1128   K 3.7 03/05/2017 1051   CL 108 04/22/2018 1131   CL 106 07/16/2017 1128   CO2 30 04/22/2018 1131   CO2 29 07/16/2017 1128   CO2 25 03/05/2017 1051   BUN 17 04/22/2018 1131   BUN 23 (H) 07/16/2017 1128   BUN 18.2 03/05/2017 1051   CREATININE 0.80 04/22/2018 1131   CREATININE 0.76 11/03/2017 0919   CREATININE 0.8 03/05/2017 1051      Component Value Date/Time   CALCIUM 9.2 04/22/2018 1131   CALCIUM 9.7  07/16/2017 1128   CALCIUM 8.9 03/05/2017 1051   ALKPHOS 66 04/22/2018 1131   ALKPHOS 71 07/16/2017 1128   ALKPHOS 67 03/05/2017 1051   AST 23 04/22/2018 1131   AST 15 03/05/2017 1051   ALT 24 04/22/2018 1131   ALT 23 07/16/2017 1128   ALT 17 03/05/2017 1051   BILITOT 0.9 04/22/2018 1131   BILITOT 0.55 03/05/2017 1051      Impression and Plan: Debra Ruiz is a very pleasant 72 yo  caucasian female with IgG kappa myeloma. She has responded nicely to treatment and her myeloma counts have remained stable.  She is here today with c/o of her left eye lid drooping and left pupil sluggish. She has no other neurologic symptoms.  Dr. Marin Olp was also able to assess and we will get a chest xray today followed by MRIs of the brain, orbits and cervical spine to assess for cause.  Once we have these results we will determine treatment/referal to neurology if needed.  They are in agreement with the plan and will contact our office with any questions or concerns. We can certainly see her sooner if need be.   Laverna Peace, NP 10/11/20192:11 PM

## 2018-05-03 ENCOUNTER — Other Ambulatory Visit: Payer: Self-pay | Admitting: Hematology & Oncology

## 2018-05-03 DIAGNOSIS — C9000 Multiple myeloma not having achieved remission: Secondary | ICD-10-CM

## 2018-05-04 ENCOUNTER — Other Ambulatory Visit: Payer: Self-pay | Admitting: Internal Medicine

## 2018-05-04 DIAGNOSIS — F439 Reaction to severe stress, unspecified: Secondary | ICD-10-CM

## 2018-05-04 DIAGNOSIS — F329 Major depressive disorder, single episode, unspecified: Secondary | ICD-10-CM

## 2018-05-04 DIAGNOSIS — H04123 Dry eye syndrome of bilateral lacrimal glands: Secondary | ICD-10-CM

## 2018-05-04 DIAGNOSIS — G8929 Other chronic pain: Secondary | ICD-10-CM

## 2018-05-04 DIAGNOSIS — I10 Essential (primary) hypertension: Secondary | ICD-10-CM

## 2018-05-04 DIAGNOSIS — M545 Low back pain, unspecified: Secondary | ICD-10-CM

## 2018-05-04 DIAGNOSIS — Z0000 Encounter for general adult medical examination without abnormal findings: Secondary | ICD-10-CM

## 2018-05-04 DIAGNOSIS — F32A Depression, unspecified: Secondary | ICD-10-CM

## 2018-05-04 DIAGNOSIS — C9001 Multiple myeloma in remission: Secondary | ICD-10-CM

## 2018-05-04 DIAGNOSIS — K219 Gastro-esophageal reflux disease without esophagitis: Secondary | ICD-10-CM

## 2018-05-04 DIAGNOSIS — E7849 Other hyperlipidemia: Secondary | ICD-10-CM

## 2018-05-04 DIAGNOSIS — F419 Anxiety disorder, unspecified: Principal | ICD-10-CM

## 2018-05-05 DIAGNOSIS — M47816 Spondylosis without myelopathy or radiculopathy, lumbar region: Secondary | ICD-10-CM | POA: Diagnosis not present

## 2018-05-05 DIAGNOSIS — M47814 Spondylosis without myelopathy or radiculopathy, thoracic region: Secondary | ICD-10-CM | POA: Diagnosis not present

## 2018-05-05 DIAGNOSIS — M6281 Muscle weakness (generalized): Secondary | ICD-10-CM | POA: Diagnosis not present

## 2018-05-07 ENCOUNTER — Other Ambulatory Visit: Payer: PPO | Admitting: Internal Medicine

## 2018-05-07 ENCOUNTER — Telehealth: Payer: Self-pay | Admitting: *Deleted

## 2018-05-07 DIAGNOSIS — F419 Anxiety disorder, unspecified: Secondary | ICD-10-CM | POA: Diagnosis not present

## 2018-05-07 DIAGNOSIS — C9001 Multiple myeloma in remission: Secondary | ICD-10-CM

## 2018-05-07 DIAGNOSIS — Z0000 Encounter for general adult medical examination without abnormal findings: Secondary | ICD-10-CM

## 2018-05-07 DIAGNOSIS — K219 Gastro-esophageal reflux disease without esophagitis: Secondary | ICD-10-CM

## 2018-05-07 DIAGNOSIS — H04123 Dry eye syndrome of bilateral lacrimal glands: Secondary | ICD-10-CM

## 2018-05-07 DIAGNOSIS — F32A Depression, unspecified: Secondary | ICD-10-CM

## 2018-05-07 DIAGNOSIS — M47816 Spondylosis without myelopathy or radiculopathy, lumbar region: Secondary | ICD-10-CM | POA: Diagnosis not present

## 2018-05-07 DIAGNOSIS — F329 Major depressive disorder, single episode, unspecified: Secondary | ICD-10-CM | POA: Diagnosis not present

## 2018-05-07 DIAGNOSIS — G8929 Other chronic pain: Secondary | ICD-10-CM

## 2018-05-07 DIAGNOSIS — M47814 Spondylosis without myelopathy or radiculopathy, thoracic region: Secondary | ICD-10-CM | POA: Diagnosis not present

## 2018-05-07 DIAGNOSIS — M6281 Muscle weakness (generalized): Secondary | ICD-10-CM | POA: Diagnosis not present

## 2018-05-07 DIAGNOSIS — I10 Essential (primary) hypertension: Secondary | ICD-10-CM

## 2018-05-07 DIAGNOSIS — E7849 Other hyperlipidemia: Secondary | ICD-10-CM

## 2018-05-07 DIAGNOSIS — F439 Reaction to severe stress, unspecified: Secondary | ICD-10-CM

## 2018-05-07 DIAGNOSIS — M545 Low back pain: Secondary | ICD-10-CM

## 2018-05-07 LAB — LIPID PANEL
CHOL/HDL RATIO: 3 (calc) (ref <5.0)
Cholesterol: 193 mg/dL (ref <200)
HDL: 64 mg/dL (ref >50)
LDL Cholesterol (Calc): 105 mg/dL (calc) — ABNORMAL HIGH
NON-HDL CHOLESTEROL (CALC): 129 mg/dL (ref <130)
TRIGLYCERIDES: 143 mg/dL (ref <150)

## 2018-05-07 LAB — COMPLETE METABOLIC PANEL WITH GFR
AG RATIO: 2.4 (calc) (ref 1.0–2.5)
ALKALINE PHOSPHATASE (APISO): 62 U/L (ref 33–130)
ALT: 16 U/L (ref 6–29)
AST: 15 U/L (ref 10–35)
Albumin: 4 g/dL (ref 3.6–5.1)
BILIRUBIN TOTAL: 0.5 mg/dL (ref 0.2–1.2)
BUN: 15 mg/dL (ref 7–25)
CHLORIDE: 104 mmol/L (ref 98–110)
CO2: 27 mmol/L (ref 20–32)
Calcium: 8.8 mg/dL (ref 8.6–10.4)
Creat: 0.69 mg/dL (ref 0.60–0.93)
GFR, Est African American: 102 mL/min/{1.73_m2} (ref >=60)
GFR, Est Non African American: 88 mL/min/{1.73_m2} (ref >=60)
Globulin: 1.7 g/dL (calc) — ABNORMAL LOW (ref 1.9–3.7)
Glucose, Bld: 88 mg/dL (ref 65–99)
Potassium: 4.5 mmol/L (ref 3.5–5.3)
Sodium: 140 mmol/L (ref 135–146)
Total Protein: 5.7 g/dL — ABNORMAL LOW (ref 6.1–8.1)

## 2018-05-07 LAB — CBC WITH DIFFERENTIAL/PLATELET
Basophils Absolute: 31 cells/uL (ref 0–200)
Basophils Relative: 0.5 %
EOS PCT: 1.8 %
Eosinophils Absolute: 110 cells/uL (ref 15–500)
HEMATOCRIT: 40.3 % (ref 35.0–45.0)
Hemoglobin: 14 g/dL (ref 11.7–15.5)
LYMPHS ABS: 1080 {cells}/uL (ref 850–3900)
MCH: 32.8 pg (ref 27.0–33.0)
MCHC: 34.7 g/dL (ref 32.0–36.0)
MCV: 94.4 fL (ref 80.0–100.0)
MPV: 9.5 fL (ref 7.5–12.5)
Monocytes Relative: 11.1 %
NEUTROS ABS: 4203 {cells}/uL (ref 1500–7800)
NEUTROS PCT: 68.9 %
Platelets: 249 10*3/uL (ref 140–400)
RBC: 4.27 10*6/uL (ref 3.80–5.10)
RDW: 12.9 % (ref 11.0–15.0)
Total Lymphocyte: 17.7 %
WBC mixed population: 677 cells/uL (ref 200–950)
WBC: 6.1 10*3/uL (ref 3.8–10.8)

## 2018-05-07 LAB — TSH: TSH: 6.16 mIU/L — ABNORMAL HIGH (ref 0.40–4.50)

## 2018-05-07 NOTE — Telephone Encounter (Addendum)
  Patient is aware of results   ----- Message from Eliezer Bottom, NP sent at 05/07/2018  9:04 AM EDT ----- Chest xray negative. Thank you!   ----- Message ----- From: Interface, Rad Results In Sent: 05/03/2018   5:43 PM EDT To: Eliezer Bottom, NP

## 2018-05-12 ENCOUNTER — Encounter: Payer: Self-pay | Admitting: Internal Medicine

## 2018-05-12 ENCOUNTER — Ambulatory Visit (INDEPENDENT_AMBULATORY_CARE_PROVIDER_SITE_OTHER): Payer: PPO | Admitting: Internal Medicine

## 2018-05-12 VITALS — BP 120/78 | HR 107 | Temp 98.7°F | Ht <= 58 in | Wt 118.0 lb

## 2018-05-12 DIAGNOSIS — H903 Sensorineural hearing loss, bilateral: Secondary | ICD-10-CM | POA: Diagnosis not present

## 2018-05-12 DIAGNOSIS — C9000 Multiple myeloma not having achieved remission: Secondary | ICD-10-CM | POA: Diagnosis not present

## 2018-05-12 DIAGNOSIS — Z0000 Encounter for general adult medical examination without abnormal findings: Secondary | ICD-10-CM | POA: Diagnosis not present

## 2018-05-12 DIAGNOSIS — H04123 Dry eye syndrome of bilateral lacrimal glands: Secondary | ICD-10-CM | POA: Diagnosis not present

## 2018-05-12 DIAGNOSIS — I10 Essential (primary) hypertension: Secondary | ICD-10-CM | POA: Diagnosis not present

## 2018-05-12 DIAGNOSIS — G902 Horner's syndrome: Secondary | ICD-10-CM | POA: Diagnosis not present

## 2018-05-12 DIAGNOSIS — Z6824 Body mass index (BMI) 24.0-24.9, adult: Secondary | ICD-10-CM | POA: Diagnosis not present

## 2018-05-12 DIAGNOSIS — M47816 Spondylosis without myelopathy or radiculopathy, lumbar region: Secondary | ICD-10-CM | POA: Diagnosis not present

## 2018-05-12 DIAGNOSIS — E7849 Other hyperlipidemia: Secondary | ICD-10-CM | POA: Diagnosis not present

## 2018-05-12 DIAGNOSIS — F32A Depression, unspecified: Secondary | ICD-10-CM

## 2018-05-12 DIAGNOSIS — F419 Anxiety disorder, unspecified: Secondary | ICD-10-CM | POA: Diagnosis not present

## 2018-05-12 DIAGNOSIS — F329 Major depressive disorder, single episode, unspecified: Secondary | ICD-10-CM

## 2018-05-12 DIAGNOSIS — R7989 Other specified abnormal findings of blood chemistry: Secondary | ICD-10-CM | POA: Diagnosis not present

## 2018-05-12 DIAGNOSIS — M6281 Muscle weakness (generalized): Secondary | ICD-10-CM | POA: Diagnosis not present

## 2018-05-12 DIAGNOSIS — M47814 Spondylosis without myelopathy or radiculopathy, thoracic region: Secondary | ICD-10-CM | POA: Diagnosis not present

## 2018-05-12 LAB — POCT URINALYSIS DIPSTICK
APPEARANCE: NORMAL
Bilirubin, UA: NEGATIVE
GLUCOSE UA: NEGATIVE
Ketones, UA: NEGATIVE
LEUKOCYTES UA: NEGATIVE
NITRITE UA: NEGATIVE
Odor: NORMAL
PROTEIN UA: NEGATIVE
RBC UA: NEGATIVE
Spec Grav, UA: 1.015 (ref 1.010–1.025)
Urobilinogen, UA: 0.2 E.U./dL
pH, UA: 6 (ref 5.0–8.0)

## 2018-05-12 LAB — TSH: TSH: 0.57 m[IU]/L (ref 0.40–4.50)

## 2018-05-12 MED ORDER — BUPROPION HCL ER (XL) 150 MG PO TB24: 150.0000 mg | ORAL_TABLET | Freq: Every day | ORAL | 0 refills | Status: DC

## 2018-05-12 NOTE — Progress Notes (Signed)
Subjective:    Patient ID: Debra Ruiz, female    DOB: [birth year], 72 y.o.   MRN: [id number]  HPI 72 year old Female for Medicare wellness annual exam and evaluation of medical issues.  Has developed left Horners's syndrome apparently. Has seen Dr. Kathrin Penner who is doing MRIs to assess.  ADD: MRI studies show no evidence of CNS tumor.  In August 2017 she was doing some yard work and strained her back.  Was treated conservatively with meloxicam and Flexeril.  In October 2017, she was seen with left-sided sciatica with history of spondylosis L3-L4 and moderately severe canal stenosis based on MRI 2011 with anterior listhesis.  [place] going to the bathroom late September 2017.  Was complaining of pain in left buttock going down left leg and pain in quadriceps muscle.  MRI was recommended.  MRI showed moderate compression fracture of T12.  This appeared to be subacute and benign fracture.  Was seen December 2017 with back pain after going into an attic the previous day.  Did not do any heavy lifting but was in a lot of back pain.  Was thought to have musculoskeletal pain was given narcotic pain medication and 6-day prednisone Dosepak.  Patient subsequently was seen by Dr. Lynann Bologna at [facility] and was found to have multifocal marrow space lesions consistent with multiple myeloma.  She also had multifactorial lumbar stenosis.  She was referred to Dr. Marin Olp who diagnosed her with IgG kappa myeloma 13 q-, +11, 14 q+- hyperdiploid multiple myeloma.  She did have T11-T12 kyphoplasty done August 22, 2016.  Was treated with chemotherapy and radiation.  Currently being treated with Xgeva every 3 months.  Revlimid stopped April 2018.  Continues with Velcade q 6 weeks and Decadron.  Had rhinoplasty at [facility] February 2017.  History of dry eyes syndrome and rosacea treated by ophthalmologist.  Had sigmoidoscopy in 2000.  A Cardiolite study 1999 performed by  Dr. Wynonia Lawman that was negative for ischemia.  Unable to tolerate Crestor according to old records when she was under the care of Dr. Sharene Butters.  Apparently had some increased liver functions on Crestor.  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  History of urge urinary incontinence.  Tubal ligation 1977.  She does have hypertension, hyperlipidemia, anxiety depression, GE reflux, insomnia.  Remote history of lumbar spondylosis.  Social history: Smoked years ago but currently non-smoker.  Drinks alcohol several times a week.  She is married.  History of chronic dysthymia.  Has fear of getting Alzheimer's disease because of family history.  Came to [facility] [facility] and worked for an [employer].  Has been married 3 times.  No children.  Has a twin sister.  She raised concerns about her memory in 2003 and was sent to [facility] neuropsych[facility] in [place] for testing for possible memory disorder.  She had good verbal memory but some impairment with visual memory.  She noted improvement with depression being changed from Prozac to [place].  It was recommended at that time that she be retested in a year but she has chosen not to do that.  Seems less concerned about memory loss at the present time.  Family history: Father died at age 54 of an MI with history of hypertension.  Mother died at age 80 of a stroke with history of hypertension.  Brother with history of brain tumor cardiac defibrillator.  Twin sister lives in [place].  Sister with history of hypertension and hyperlipidemia.  There is a strong family history of Alzheimer's disease and.            Review of Systems fatigue, no chest pain, no urinary symptoms, no cough, no headache     Objective:   Physical Exam  Constitutional: She is oriented to person, place, and time. She appears well-developed and well-nourished. No distress.  HENT:  Head: Normocephalic.  Right Ear: External ear normal.  Left Ear:  External ear normal.  Mouth/Throat: Oropharynx is clear and moist. No oropharyngeal exudate.  Eyes: Pupils are equal, round, and reactive to light. Right eye exhibits no discharge. Left eye exhibits no discharge. No scleral icterus.  Neck: Neck supple. No JVD present. No tracheal deviation present. No thyromegaly present.  Cardiovascular: Normal rate, regular rhythm, normal heart sounds and intact distal pulses.  No murmur heard. Pulmonary/Chest: Effort normal and breath sounds normal. No stridor. No respiratory distress. She has no wheezes. She has no rales.  Breasts normal Female  Abdominal: Soft. Bowel sounds are normal. She exhibits no distension and no mass. There is no tenderness. There is no rebound and no guarding.  Genitourinary:  Genitourinary Comments: Pap deferred due to age.  Last done 2015.  Bimanual normal.  Lymphadenopathy:    She has no cervical adenopathy.  Neurological: She is alert and oriented to person, place, and time. She displays normal reflexes. No sensory deficit. Coordination normal.  Left ptosis noted  Skin: Skin is warm and dry. No rash noted. She is not diaphoretic.  Psychiatric: She has a normal mood and affect. Her behavior is normal. Judgment and thought content normal.  Vitals reviewed.         Assessment & Plan:  Elevated TSH- repeat today may have primary hypothyroidism  Repeat TSH is completely normal.  Do not know why recent TSH was elevated.  She has no prior history of hypothyroidism and to my knowledge is not on thyroid replacement therapy.  Continue to monitor TSH.  Check in 6 months.  History of myeloma under the care of Dr. Marin Olp and continues to receive treatment  Recent onset of left Horner syndrome-being evaluated by Dr. Kathrin Penner  Needs annual mammogram at [facility]    Left Horner's syndrome- diagnosed by Dr. Kathrin Penner  Essential hypertension-stable on current regimen of Norvasc and Bystolic   Hyperlipidemia- tolerates  Lipitor 20 mg daily  History of depression stable with Lexapro and Wellbutrin added at this visit because of complaint of fatigue, anxiety and depression.  Health maintenance-needs mammogram  Flu vaccine declined  Is previously declined colonoscopy  GE reflux history-currently not on prescribed PPI  Anxiety treated with Xanax  History of dry eye syndrome  Plan: Patient is pursuing work-up for Horner syndrome per ophthalmologist.  Continue close follow-up with Dr. Marin Olp.  Follow-up in 6 months or as needed.  LDL cholesterol stable at 105    Anxiety and depression- Add Wellbutrin 150 mg XL daily to Lexapro.  Plan: Repeat TSH with further recommendations to follow.   Subjective:   Patient presents for Medicare Annual/Subsequent preventive examination.  Review Past Medical/Family/Social: see above   Risk Factors  Current exercise habits: Mostly sedentary with light walking Dietary issues discussed: Low-fat/ low carbohydrate  Cardiac risk factors: Hyperlipidemia, family history  Depression Screen  (Note: if answer to either of the following is "Yes", a more complete depression screening is indicated)   Over the past two weeks, have you felt down, depressed or hopeless? No  Over the past two weeks, have you felt little interest or pleasure in doing things? No Have you lost interest or pleasure in daily life? No Do you often feel hopeless? No Do you cry easily over simple problems? No   Activities of Daily Living  In your present state of health, do you have any difficulty performing the following activities?:   Driving? No  Managing money? No  Feeding yourself? No  Getting from bed to chair? No  Climbing a flight of stairs? No  Preparing food and eating?: No  Bathing or showering? No  Getting dressed: No  Getting to the toilet? No  Using the toilet:No  Moving around from place to place: No  In the past year have you fallen or had a near fall?:No  Are you  sexually active? No  Do you have more than one partner? No   Hearing Difficulties: He is recently seen by audiologist Do you often ask people to speak up or repeat themselves?  Yes Do you experience ringing or noises in your ears? No  Do you have difficulty understanding soft or whispered voices?  Yes Do you feel that you have a problem with memory?  Yes see dictation above but stable Do you often misplace items? No    Home Safety:  Do you have a smoke alarm at your residence? Yes Do you have grab bars in the bathroom?  No Do you have throw rugs in your house?  No   Cognitive Testing  Alert? Yes Normal Appearance?Yes  Oriented to person? Yes Place? Yes  Time? Yes  Recall of three objects? Yes  Can perform simple calculations? Yes  Displays appropriate judgment?Yes  Can read the correct time from a watch face?Yes   List the Names of Other Physician/Practitioners you currently use:  See referral list for the physicians patient is currently seeing.  Dr. Marin Olp  Dr. Kathrin Penner   Review of Systems: See above   Objective:     General appearance: Appears stated age  Head: Normocephalic, without obvious abnormality, atraumatic  Eyes: conj clear, EOMi PEERLA  Ears: normal TM's and external ear canals both ears  Nose: Nares normal. Septum midline. Mucosa normal. No drainage or sinus tenderness.  Throat: lips, mucosa, and tongue normal; teeth and gums normal  Neck: no adenopathy, no carotid bruit, no JVD, supple, symmetrical, trachea midline and thyroid not enlarged, symmetric, no tenderness/mass/nodules  No CVA tenderness.  Lungs: clear to auscultation bilaterally  Breasts: normal appearance, no masses or tenderness, Heart: regular rate and rhythm, S1, S2 normal, no murmur, click, rub or gallop  Abdomen: soft, non-tender; bowel sounds normal; no masses, no organomegaly  Musculoskeletal: ROM normal in all joints, no crepitus, no deformity, Normal muscle strengthen. Back  is  symmetric, no curvature. Skin: Skin color, texture, turgor normal. No rashes or lesions  Lymph nodes: Cervical, supraclavicular, and axillary nodes normal.  Neurologic: CN 2 -12 Normal, Normal symmetric reflexes. Normal coordination and gait  Psych: Alert & Oriented x 3, Mood appear stable.    Assessment:    Annual wellness medicare exam   Plan:    During the course of the visit the patient was educated and counseled about appropriate screening and preventive services including:    Annual mammogram    Patient Instructions (the written plan) was given to the patient.  Medicare Attestation  I have personally reviewed:  The patient's medical and social history  Their use of alcohol, tobacco or illicit drugs  Their current  medications and supplements  The patient's functional ability including ADLs,fall risks, home safety risks, cognitive, and hearing and visual impairment  Diet and physical activities  Evidence for depression or mood disorders  The patient's weight, height, BMI, and visual acuity have been recorded in the chart. I have made referrals, counseling, and provided education to the patient based on review of the above and I have provided the patient with a written personalized care plan for preventive services.

## 2018-05-12 NOTE — Patient Instructions (Signed)
TSH repeated. To have evaluation for Horner's syndrome per ophthalmologist. Add Wellbutrin for low energy, anxiety and depression.Have mammogram this year. Declines flu vaccine today.

## 2018-05-13 ENCOUNTER — Ambulatory Visit
Admission: RE | Admit: 2018-05-13 | Discharge: 2018-05-13 | Disposition: A | Discharge status: Home / Self Care | Payer: PPO | Source: Ambulatory Visit | Attending: Family | Admitting: Family

## 2018-05-13 DIAGNOSIS — Z8579 Personal history of other malignant neoplasms of lymphoid, hematopoietic and related tissues: Secondary | ICD-10-CM | POA: Diagnosis not present

## 2018-05-13 DIAGNOSIS — C9000 Multiple myeloma not having achieved remission: Secondary | ICD-10-CM

## 2018-05-13 DIAGNOSIS — H02536 Eyelid retraction left eye, unspecified eyelid: Secondary | ICD-10-CM

## 2018-05-13 MED ORDER — GADOBENATE DIMEGLUMINE 529 MG/ML IV SOLN
10.0000 mL | Freq: Once | INTRAVENOUS | Status: AC | PRN
  Administered 2018-05-13: 10 mL via INTRAVENOUS

## 2018-05-16 ENCOUNTER — Ambulatory Visit
Admission: RE | Admit: 2018-05-16 | Discharge: 2018-05-16 | Disposition: A | Discharge status: Home / Self Care | Payer: PPO | Source: Ambulatory Visit | Attending: Family | Admitting: Family

## 2018-05-16 DIAGNOSIS — C9000 Multiple myeloma not having achieved remission: Secondary | ICD-10-CM

## 2018-05-16 DIAGNOSIS — H02536 Eyelid retraction left eye, unspecified eyelid: Secondary | ICD-10-CM

## 2018-05-16 MED ORDER — GADOBENATE DIMEGLUMINE 529 MG/ML IV SOLN
10.0000 mL | Freq: Once | INTRAVENOUS | Status: AC | PRN
  Administered 2018-05-16: 10 mL via INTRAVENOUS

## 2018-05-18 ENCOUNTER — Telehealth: Payer: Self-pay | Admitting: Family

## 2018-05-18 NOTE — Telephone Encounter (Signed)
I was able to go over Ms. Elenes's scans with her in detail today and we will get her back in with Dr. Lynann Bologna for possible treatment of the cervical spine stenosis, spinal cord mass effect and suspected spinal cord myelomalacia. She is in agreement with the plan.

## 2018-05-20 DIAGNOSIS — M47814 Spondylosis without myelopathy or radiculopathy, thoracic region: Secondary | ICD-10-CM | POA: Diagnosis not present

## 2018-05-20 DIAGNOSIS — M47816 Spondylosis without myelopathy or radiculopathy, lumbar region: Secondary | ICD-10-CM | POA: Diagnosis not present

## 2018-05-20 DIAGNOSIS — M6281 Muscle weakness (generalized): Secondary | ICD-10-CM | POA: Diagnosis not present

## 2018-05-26 DIAGNOSIS — M6281 Muscle weakness (generalized): Secondary | ICD-10-CM | POA: Diagnosis not present

## 2018-05-26 DIAGNOSIS — M47814 Spondylosis without myelopathy or radiculopathy, thoracic region: Secondary | ICD-10-CM | POA: Diagnosis not present

## 2018-05-26 DIAGNOSIS — M47816 Spondylosis without myelopathy or radiculopathy, lumbar region: Secondary | ICD-10-CM | POA: Diagnosis not present

## 2018-05-28 DIAGNOSIS — M6281 Muscle weakness (generalized): Secondary | ICD-10-CM | POA: Diagnosis not present

## 2018-05-28 DIAGNOSIS — M47814 Spondylosis without myelopathy or radiculopathy, thoracic region: Secondary | ICD-10-CM | POA: Diagnosis not present

## 2018-05-28 DIAGNOSIS — M47816 Spondylosis without myelopathy or radiculopathy, lumbar region: Secondary | ICD-10-CM | POA: Diagnosis not present

## 2018-06-03 ENCOUNTER — Other Ambulatory Visit: Payer: Self-pay | Admitting: *Deleted

## 2018-06-03 ENCOUNTER — Inpatient Hospital Stay: Payer: PPO | Attending: Hematology & Oncology | Admitting: Hematology & Oncology

## 2018-06-03 ENCOUNTER — Encounter: Payer: Self-pay | Admitting: Hematology & Oncology

## 2018-06-03 ENCOUNTER — Inpatient Hospital Stay: Payer: PPO

## 2018-06-03 ENCOUNTER — Other Ambulatory Visit: Payer: Self-pay

## 2018-06-03 VITALS — BP 128/55 | HR 80 | Temp 97.4°F | Resp 20 | Wt 119.0 lb

## 2018-06-03 DIAGNOSIS — C9000 Multiple myeloma not having achieved remission: Secondary | ICD-10-CM

## 2018-06-03 DIAGNOSIS — Z5112 Encounter for antineoplastic immunotherapy: Secondary | ICD-10-CM | POA: Insufficient documentation

## 2018-06-03 DIAGNOSIS — M4800 Spinal stenosis, site unspecified: Secondary | ICD-10-CM | POA: Diagnosis not present

## 2018-06-03 LAB — CMP (CANCER CENTER ONLY)
ALK PHOS: 66 U/L (ref 26–84)
ALT: 23 U/L (ref 10–47)
ANION GAP: 9 (ref 5–15)
AST: 25 U/L (ref 11–38)
Albumin: 3.8 g/dL (ref 3.5–5.0)
BILIRUBIN TOTAL: 0.9 mg/dL (ref 0.2–1.6)
BUN: 18 mg/dL (ref 7–22)
CALCIUM: 9.2 mg/dL (ref 8.0–10.3)
CO2: 27 mmol/L (ref 18–33)
Chloride: 103 mmol/L (ref 98–108)
Creatinine: 1.2 mg/dL (ref 0.60–1.20)
Glucose, Bld: 91 mg/dL (ref 73–118)
Potassium: 3.4 mmol/L (ref 3.3–4.7)
Sodium: 139 mmol/L (ref 128–145)
TOTAL PROTEIN: 6.8 g/dL (ref 6.4–8.1)

## 2018-06-03 LAB — CBC WITH DIFFERENTIAL (CANCER CENTER ONLY)
Abs Immature Granulocytes: 0.1 10*3/uL — ABNORMAL HIGH (ref 0.00–0.07)
BASOS ABS: 0 10*3/uL (ref 0.0–0.1)
BASOS PCT: 0 %
EOS ABS: 0.1 10*3/uL (ref 0.0–0.5)
EOS PCT: 1 %
HEMATOCRIT: 43.6 % (ref 36.0–46.0)
Hemoglobin: 14 g/dL (ref 12.0–15.0)
IMMATURE GRANULOCYTES: 1 %
LYMPHS ABS: 1.2 10*3/uL (ref 0.7–4.0)
Lymphocytes Relative: 14 %
MCH: 32.1 pg (ref 26.0–34.0)
MCHC: 32.1 g/dL (ref 30.0–36.0)
MCV: 100 fL (ref 80.0–100.0)
Monocytes Absolute: 0.9 10*3/uL (ref 0.1–1.0)
Monocytes Relative: 11 %
NRBC: 0 % (ref 0.0–0.2)
Neutro Abs: 6.4 10*3/uL (ref 1.7–7.7)
Neutrophils Relative %: 73 %
Platelet Count: 266 10*3/uL (ref 150–400)
RBC: 4.36 MIL/uL (ref 3.87–5.11)
RDW: 12.3 % (ref 11.5–15.5)
WBC: 8.7 10*3/uL (ref 4.0–10.5)

## 2018-06-03 MED ORDER — BORTEZOMIB CHEMO SQ INJECTION 3.5 MG (2.5MG/ML)
1.3000 mg/m2 | Freq: Once | INTRAMUSCULAR | Status: AC
  Administered 2018-06-03: 2 mg via SUBCUTANEOUS
  Filled 2018-06-03: qty 0.8

## 2018-06-03 MED ORDER — PROCHLORPERAZINE MALEATE 10 MG PO TABS: 10.0000 mg | ORAL_TABLET | Freq: Once | ORAL | Status: DC

## 2018-06-03 MED ORDER — PROCHLORPERAZINE MALEATE 10 MG PO TABS
ORAL_TABLET | ORAL | Status: AC
  Filled 2018-06-03: qty 1

## 2018-06-03 MED ORDER — DEXAMETHASONE 4 MG PO TABS: ORAL_TABLET | ORAL | 3 refills | Status: DC

## 2018-06-03 NOTE — Progress Notes (Signed)
Hematology and Oncology Follow Up Visit  Debra Ruiz [id number] [birth year] 72 y.o. 06/03/2018   Principle Diagnosis:  IgG Kappa myeloma - compression fractures - 13q-, +11, 14q+ - hyperdiploid  Completed Therapy: S/p Kyphoplasty of T11 and T12 XRT to lower spine-completed on 09/25/2016  Current Therapy:   Velcade/Decadron -status post cycle #19 - Revlimid stopped on 11/06/2016 Xgeva 134m Sq q  3 month - next dose in January 2020   Interim History:  [name] here today for follow-up.  The real problem that she has had has been some ptosis of the left eye.  She originally saw her ophthalmologist.  The ophthalmologist told her that she had cancer and that cancer was causing the ptosis.  The ophthalmologist thought that [name] Horner's syndrome.  We did an extensive work-up on her.  She is had MRI of the orbit, brain, cervical spine.  The only thing that we found was that with the MRI of the cervical spine, she had severe spinal stenosis from C4-C6.  I do not know if the stellate ganglion would be affected by this spinal stenosis.  We will have to refer to orthopedic surgery.  She has seen Dr. MPhylliss Bobin the past.  As far as her myeloma is concerned, she is done incredibly well with this.  Last time we saw her in October, there was no monoclonal spike in her blood.  Her IgG level was 3 to 60 mg/dL.  Her Kappa light chain was 0.7 mg/dL.  Her appetite is good.  She is had no cough.  She had no change in bowel or bladder habits.  She is had no weakness in her left arm.  [place]  She is looking forward to this.  Overall, her performance status is ECOG 1.    Medications:  Allergies as of 06/03/2018      Reactions   Crestor [rosuvastatin Calcium]    Causes liver functions to be elevated      Medication List        Accurate as of 06/03/18 12:37 PM. Always use your most recent med list.          ALPRAZolam 0.25 MG tablet Commonly known as:  XANAX TAKE 1 TABLET BY MOUTH TWICE DAILY AS NEEDED FOR ANXIETY   amLODipine 5 MG tablet Commonly known as:  NORVASC TAKE 1 TABLET(5 MG) BY MOUTH DAILY   aspirin EC 81 MG tablet Take 81 mg by mouth 2 (two) times a week.   atorvastatin 20 MG tablet Commonly known as:  LIPITOR TAKE 1 TABLET(20 MG) BY MOUTH DAILY   buPROPion 150 MG 24 hr tablet Commonly known as:  WELLBUTRIN XL Take 1 tablet (150 mg total) by mouth daily.   BYSTOLIC 5 MG tablet Generic drug:  nebivolol TAKE 1 TABLET(5 MG) BY MOUTH DAILY   dexamethasone 4 MG tablet Commonly known as:  DECADRON TAKE 3 TABLETS BY MOUTH ONCE A WEEK   escitalopram 20 MG tablet Commonly known as:  LEXAPRO TAKE 1 TABLET(20 MG) BY MOUTH DAILY   famciclovir 250 MG tablet Commonly known as:  FAMVIR Take 1 tablet (250 mg total) by mouth daily.   potassium chloride SA 20 MEQ tablet Commonly known as:  K-DUR,KLOR-CON TAKE 1 TABLET(20 MEQ) BY MOUTH DAILY   PREVNAR 13 Susp injection Generic drug:  pneumococcal 13-valent conjugate vaccine ADM 0.5ML IM UTD   prochlorperazine 10 MG tablet Commonly known  as:  COMPAZINE TAKE 1 TABLET(10 MG) BY MOUTH EVERY 6 HOURS AS NEEDED FOR NAUSEA OR VOMITING   tiZANidine 2 MG tablet Commonly known as:  ZANAFLEX TK 1 TO 2 TS PO Q 8 H FOR SPASM   valACYclovir 1000 MG tablet Commonly known as:  VALTREX Take by mouth.   valACYclovir 500 MG tablet Commonly known as:  VALTREX Take 500 mg by mouth daily as needed (Fever Blisters.).   Vitamin D (Ergocalciferol) 50 MCG (2000 UT) Caps Take 2,000 Units by mouth daily.   Vitamin D 50 MCG (2000 UT) Caps Take 2,000 Units by mouth daily.       Allergies:  Allergies  Allergen Reactions  . Crestor [Rosuvastatin Calcium]     Causes liver functions to be elevated    Past Medical History, Surgical history, Social history, and Family History were reviewed and updated.  Review of Systems: Review of  Systems  Constitutional: Negative.   HENT: Negative.   Eyes: Negative.   Respiratory: Negative.   Cardiovascular: Negative.   Gastrointestinal: Negative.   Genitourinary: Negative.   Musculoskeletal: Positive for back pain.  Skin: Negative.   Neurological: Negative.   Endo/Heme/Allergies: Negative.   Psychiatric/Behavioral: Negative.     Physical Exam:  weight is 119 lb (54 kg). Her oral temperature is 97.4 F (36.3 C) (abnormal). Her blood pressure is 128/55 (abnormal) and her pulse is 80. Her respiration is 20 and oxygen saturation is 100%.   Wt Readings from Last 3 Encounters:  06/03/18 119 lb (54 kg)  05/12/18 118 lb (53.5 kg)  05/01/18 119 lb (54 kg)    Physical Exam  Constitutional: She is oriented to person, place, and time.  HENT:  Head: Normocephalic and atraumatic.  Mouth/Throat: Oropharynx is clear and moist.  Eyes: Pupils are equal, round, and reactive to light. EOM are normal.  Neck: Normal range of motion.  Cardiovascular: Normal rate, regular rhythm and normal heart sounds.  Pulmonary/Chest: Effort normal and breath sounds normal.  Abdominal: Soft. Bowel sounds are normal.  Musculoskeletal: Normal range of motion. She exhibits no edema, tenderness or deformity.  Lymphadenopathy:    She has no cervical adenopathy.  Neurological: She is alert and oriented to person, place, and time.  Skin: Skin is warm and dry. No rash noted. No erythema.  Psychiatric: She has a normal mood and affect. Her behavior is normal. Judgment and thought content normal.  Vitals reviewed.     Lab Results  Component Value Date   WBC 8.7 06/03/2018   HGB 14.0 06/03/2018   HCT 43.6 06/03/2018   MCV 100.0 06/03/2018   PLT 266 06/03/2018   No results found for: FERRITIN, IRON, TIBC, UIBC, IRONPCTSAT Lab Results  Component Value Date   RBC 4.36 06/03/2018   Lab Results  Component Value Date   KPAFRELGTCHN 6.5 04/22/2018   LAMBDASER 3.3 (L) 04/22/2018   KAPLAMBRATIO 1.97 (H)  04/22/2018   Lab Results  Component Value Date   IGGSERUM 360 (L) 04/22/2018   IGA 21 (L) 04/22/2018   IGMSERUM 31 04/22/2018   Lab Results  Component Value Date   TOTALPROTELP 6.0 04/22/2018   ALBUMINELP 3.7 04/22/2018   A1GS 0.2 04/22/2018   A2GS 0.8 04/22/2018   BETS 1.0 04/22/2018   GAMS 0.3 (L) 04/22/2018   MSPIKE Not Observed 04/22/2018   SPEI Comment 01/28/2018     Chemistry      Component Value Date/Time   NA 139 06/03/2018 1051   NA 148 (H) 07/16/2017 1128  NA 143 03/05/2017 1051   K 3.4 06/03/2018 1051   K 3.8 07/16/2017 1128   K 3.7 03/05/2017 1051   CL 103 06/03/2018 1051   CL 106 07/16/2017 1128   CO2 27 06/03/2018 1051   CO2 29 07/16/2017 1128   CO2 25 03/05/2017 1051   BUN 18 06/03/2018 1051   BUN 23 (H) 07/16/2017 1128   BUN 18.2 03/05/2017 1051   CREATININE 1.20 06/03/2018 1051   CREATININE 0.69 05/07/2018 1233   CREATININE 0.8 03/05/2017 1051      Component Value Date/Time   CALCIUM 9.2 06/03/2018 1051   CALCIUM 9.7 07/16/2017 1128   CALCIUM 8.9 03/05/2017 1051   ALKPHOS 66 06/03/2018 1051   ALKPHOS 71 07/16/2017 1128   ALKPHOS 67 03/05/2017 1051   AST 25 06/03/2018 1051   AST 15 03/05/2017 1051   ALT 23 06/03/2018 1051   ALT 23 07/16/2017 1128   ALT 17 03/05/2017 1051   BILITOT 0.9 06/03/2018 1051   BILITOT 0.55 03/05/2017 1051      Impression and Plan: Ms. Turck is a very pleasant 72 yo caucasian female with IgG Kappa Myeloma.  Again, she is done incredibly well.  There is no monoclonal spike noted in her blood.  We will have to see what orthopedic surgery will say about this spinal stenosis in her neck.  Again, I am not sure if this is causing this Horner's-like process with the left eye.  We will proceed with her treatment today.  I will get her back the day after New Year's for her next treatment.    [name], MD 11/13/201912:37 PM

## 2018-06-03 NOTE — Patient Instructions (Signed)

## 2018-06-04 LAB — KAPPA/LAMBDA LIGHT CHAINS
Kappa free light chain: 8.7 mg/L (ref 3.3–19.4)
Kappa, lambda light chain ratio: 0.79 (ref 0.26–1.65)
LAMDA FREE LIGHT CHAINS: 11 mg/L (ref 5.7–26.3)

## 2018-06-04 LAB — IGG, IGA, IGM
IGA: 60 mg/dL — AB (ref 64–422)
IGM (IMMUNOGLOBULIN M), SRM: 69 mg/dL (ref 26–217)
IgG (Immunoglobin G), Serum: 478 mg/dL — ABNORMAL LOW (ref 700–1600)

## 2018-06-05 LAB — PROTEIN ELECTROPHORESIS, SERUM, WITH REFLEX
A/G Ratio: 1.6 (ref 0.7–1.7)
ALBUMIN ELP: 4.1 g/dL (ref 2.9–4.4)
ALPHA-1-GLOBULIN: 0.3 g/dL (ref 0.0–0.4)
ALPHA-2-GLOBULIN: 0.8 g/dL (ref 0.4–1.0)
BETA GLOBULIN: 1.1 g/dL (ref 0.7–1.3)
Gamma Globulin: 0.4 g/dL (ref 0.4–1.8)
Globulin, Total: 2.5 g/dL (ref 2.2–3.9)
M-Spike, %: 0.1 g/dL — ABNORMAL HIGH
SPEP INTERP: 0
Total Protein ELP: 6.6 g/dL (ref 6.0–8.5)

## 2018-06-05 LAB — IMMUNOFIXATION REFLEX, SERUM
IGA: 62 mg/dL — AB (ref 64–422)
IGM (IMMUNOGLOBULIN M), SRM: 72 mg/dL (ref 26–217)
IgG (Immunoglobin G), Serum: 527 mg/dL — ABNORMAL LOW (ref 700–1600)

## 2018-06-08 DIAGNOSIS — M6281 Muscle weakness (generalized): Secondary | ICD-10-CM | POA: Diagnosis not present

## 2018-06-08 DIAGNOSIS — M47816 Spondylosis without myelopathy or radiculopathy, lumbar region: Secondary | ICD-10-CM | POA: Diagnosis not present

## 2018-06-08 DIAGNOSIS — M47814 Spondylosis without myelopathy or radiculopathy, thoracic region: Secondary | ICD-10-CM | POA: Diagnosis not present

## 2018-06-12 DIAGNOSIS — D1801 Hemangioma of skin and subcutaneous tissue: Secondary | ICD-10-CM | POA: Diagnosis not present

## 2018-06-12 DIAGNOSIS — D2272 Melanocytic nevi of left lower limb, including hip: Secondary | ICD-10-CM | POA: Diagnosis not present

## 2018-06-12 DIAGNOSIS — L821 Other seborrheic keratosis: Secondary | ICD-10-CM | POA: Diagnosis not present

## 2018-06-12 DIAGNOSIS — L738 Other specified follicular disorders: Secondary | ICD-10-CM | POA: Diagnosis not present

## 2018-06-12 DIAGNOSIS — L814 Other melanin hyperpigmentation: Secondary | ICD-10-CM | POA: Diagnosis not present

## 2018-06-12 DIAGNOSIS — L812 Freckles: Secondary | ICD-10-CM | POA: Diagnosis not present

## 2018-06-12 DIAGNOSIS — Z85828 Personal history of other malignant neoplasm of skin: Secondary | ICD-10-CM | POA: Diagnosis not present

## 2018-06-12 DIAGNOSIS — D225 Melanocytic nevi of trunk: Secondary | ICD-10-CM | POA: Diagnosis not present

## 2018-06-15 DIAGNOSIS — G959 Disease of spinal cord, unspecified: Secondary | ICD-10-CM | POA: Diagnosis not present

## 2018-06-15 DIAGNOSIS — M6281 Muscle weakness (generalized): Secondary | ICD-10-CM | POA: Diagnosis not present

## 2018-06-15 DIAGNOSIS — M542 Cervicalgia: Secondary | ICD-10-CM | POA: Diagnosis not present

## 2018-06-15 DIAGNOSIS — M47816 Spondylosis without myelopathy or radiculopathy, lumbar region: Secondary | ICD-10-CM | POA: Diagnosis not present

## 2018-06-15 DIAGNOSIS — M47814 Spondylosis without myelopathy or radiculopathy, thoracic region: Secondary | ICD-10-CM | POA: Diagnosis not present

## 2018-06-22 ENCOUNTER — Other Ambulatory Visit: Payer: Self-pay

## 2018-06-22 ENCOUNTER — Other Ambulatory Visit: Payer: Self-pay | Admitting: Internal Medicine

## 2018-06-22 MED ORDER — ALPRAZOLAM 0.25 MG PO TABS: 0.2500 mg | ORAL_TABLET | Freq: Two times a day (BID) | ORAL | 5 refills | Status: DC | PRN

## 2018-06-23 ENCOUNTER — Other Ambulatory Visit: Payer: Self-pay | Admitting: Family

## 2018-06-23 ENCOUNTER — Encounter: Payer: Self-pay | Admitting: Internal Medicine

## 2018-06-23 DIAGNOSIS — Z1231 Encounter for screening mammogram for malignant neoplasm of breast: Secondary | ICD-10-CM | POA: Diagnosis not present

## 2018-06-24 DIAGNOSIS — M47814 Spondylosis without myelopathy or radiculopathy, thoracic region: Secondary | ICD-10-CM | POA: Diagnosis not present

## 2018-06-24 DIAGNOSIS — M47816 Spondylosis without myelopathy or radiculopathy, lumbar region: Secondary | ICD-10-CM | POA: Diagnosis not present

## 2018-06-24 DIAGNOSIS — M6281 Muscle weakness (generalized): Secondary | ICD-10-CM | POA: Diagnosis not present

## 2018-06-26 DIAGNOSIS — M6281 Muscle weakness (generalized): Secondary | ICD-10-CM | POA: Diagnosis not present

## 2018-06-26 DIAGNOSIS — M47814 Spondylosis without myelopathy or radiculopathy, thoracic region: Secondary | ICD-10-CM | POA: Diagnosis not present

## 2018-06-26 DIAGNOSIS — M47816 Spondylosis without myelopathy or radiculopathy, lumbar region: Secondary | ICD-10-CM | POA: Diagnosis not present

## 2018-06-29 DIAGNOSIS — M47816 Spondylosis without myelopathy or radiculopathy, lumbar region: Secondary | ICD-10-CM | POA: Diagnosis not present

## 2018-06-29 DIAGNOSIS — M47814 Spondylosis without myelopathy or radiculopathy, thoracic region: Secondary | ICD-10-CM | POA: Diagnosis not present

## 2018-06-29 DIAGNOSIS — M6281 Muscle weakness (generalized): Secondary | ICD-10-CM | POA: Diagnosis not present

## 2018-07-06 ENCOUNTER — Other Ambulatory Visit: Payer: Self-pay | Admitting: Internal Medicine

## 2018-07-07 MED ORDER — FAMOTIDINE IN NACL 20-0.9 MG/50ML-% IV SOLN
INTRAVENOUS | Status: AC
  Filled 2018-07-07: qty 50

## 2018-07-07 MED ORDER — METHYLPREDNISOLONE SODIUM SUCC 125 MG IJ SOLR
INTRAMUSCULAR | Status: AC
  Filled 2018-07-07: qty 2

## 2018-07-07 MED ORDER — DIPHENHYDRAMINE HCL 25 MG PO CAPS
ORAL_CAPSULE | ORAL | Status: AC
  Filled 2018-07-07: qty 1

## 2018-07-16 ENCOUNTER — Inpatient Hospital Stay: Payer: PPO

## 2018-07-16 ENCOUNTER — Ambulatory Visit: Payer: PPO | Admitting: Hematology & Oncology

## 2018-07-16 ENCOUNTER — Other Ambulatory Visit: Payer: PPO

## 2018-07-19 ENCOUNTER — Other Ambulatory Visit: Payer: Self-pay | Admitting: Internal Medicine

## 2018-07-20 ENCOUNTER — Other Ambulatory Visit: Payer: Self-pay | Admitting: *Deleted

## 2018-07-20 DIAGNOSIS — C9000 Multiple myeloma not having achieved remission: Secondary | ICD-10-CM

## 2018-07-20 DIAGNOSIS — R5383 Other fatigue: Secondary | ICD-10-CM

## 2018-07-23 ENCOUNTER — Inpatient Hospital Stay: Payer: PPO | Attending: Hematology & Oncology

## 2018-07-23 ENCOUNTER — Telehealth: Payer: Self-pay | Admitting: Hematology & Oncology

## 2018-07-23 ENCOUNTER — Other Ambulatory Visit: Payer: Self-pay

## 2018-07-23 ENCOUNTER — Inpatient Hospital Stay: Payer: PPO

## 2018-07-23 ENCOUNTER — Inpatient Hospital Stay (HOSPITAL_BASED_OUTPATIENT_CLINIC_OR_DEPARTMENT_OTHER): Payer: PPO | Admitting: Hematology & Oncology

## 2018-07-23 ENCOUNTER — Encounter: Payer: Self-pay | Admitting: Hematology & Oncology

## 2018-07-23 ENCOUNTER — Ambulatory Visit (HOSPITAL_BASED_OUTPATIENT_CLINIC_OR_DEPARTMENT_OTHER)
Admission: RE | Admit: 2018-07-23 | Discharge: 2018-07-23 | Disposition: A | Discharge status: Home / Self Care | Payer: PPO | Source: Ambulatory Visit | Attending: Hematology & Oncology | Admitting: Hematology & Oncology

## 2018-07-23 VITALS — BP 139/67 | HR 77 | Temp 97.7°F | Resp 19 | Wt 118.0 lb

## 2018-07-23 DIAGNOSIS — C9000 Multiple myeloma not having achieved remission: Secondary | ICD-10-CM | POA: Insufficient documentation

## 2018-07-23 DIAGNOSIS — Z5112 Encounter for antineoplastic immunotherapy: Secondary | ICD-10-CM | POA: Insufficient documentation

## 2018-07-23 DIAGNOSIS — M899 Disorder of bone, unspecified: Secondary | ICD-10-CM | POA: Diagnosis not present

## 2018-07-23 DIAGNOSIS — R5383 Other fatigue: Secondary | ICD-10-CM

## 2018-07-23 LAB — CBC WITH DIFFERENTIAL (CANCER CENTER ONLY)
Abs Immature Granulocytes: 0.05 10*3/uL (ref 0.00–0.07)
Basophils Absolute: 0 10*3/uL (ref 0.0–0.1)
Basophils Relative: 0 %
Eosinophils Absolute: 0.1 10*3/uL (ref 0.0–0.5)
Eosinophils Relative: 1 %
HCT: 44.8 % (ref 36.0–46.0)
HEMOGLOBIN: 14.7 g/dL (ref 12.0–15.0)
Immature Granulocytes: 1 %
Lymphocytes Relative: 13 %
Lymphs Abs: 1.1 10*3/uL (ref 0.7–4.0)
MCH: 32.7 pg (ref 26.0–34.0)
MCHC: 32.8 g/dL (ref 30.0–36.0)
MCV: 99.6 fL (ref 80.0–100.0)
Monocytes Absolute: 0.9 10*3/uL (ref 0.1–1.0)
Monocytes Relative: 11 %
Neutro Abs: 6.6 10*3/uL (ref 1.7–7.7)
Neutrophils Relative %: 74 %
Platelet Count: 239 10*3/uL (ref 150–400)
RBC: 4.5 MIL/uL (ref 3.87–5.11)
RDW: 12.2 % (ref 11.5–15.5)
WBC Count: 8.9 10*3/uL (ref 4.0–10.5)
nRBC: 0 % (ref 0.0–0.2)

## 2018-07-23 LAB — CMP (CANCER CENTER ONLY)
ALT: 17 U/L (ref 0–44)
AST: 16 U/L (ref 15–41)
Albumin: 4.4 g/dL (ref 3.5–5.0)
Alkaline Phosphatase: 67 U/L (ref 38–126)
Anion gap: 9 (ref 5–15)
BUN: 14 mg/dL (ref 8–23)
CO2: 29 mmol/L (ref 22–32)
Calcium: 9.6 mg/dL (ref 8.9–10.3)
Chloride: 105 mmol/L (ref 98–111)
Creatinine: 0.83 mg/dL (ref 0.44–1.00)
GFR, Est AFR Am: >60 mL/min (ref >60)
Glucose, Bld: 108 mg/dL — ABNORMAL HIGH (ref 70–99)
Potassium: 4.1 mmol/L (ref 3.5–5.1)
Sodium: 143 mmol/L (ref 135–145)
Total Bilirubin: 0.6 mg/dL (ref 0.3–1.2)
Total Protein: 6.8 g/dL (ref 6.5–8.1)

## 2018-07-23 LAB — VITAMIN B12: Vitamin B-12: 286 pg/mL (ref 180–914)

## 2018-07-23 MED ORDER — BORTEZOMIB CHEMO SQ INJECTION 3.5 MG (2.5MG/ML)
1.3000 mg/m2 | Freq: Once | INTRAMUSCULAR | Status: AC
  Administered 2018-07-23: 2 mg via SUBCUTANEOUS
  Filled 2018-07-23: qty 0.8

## 2018-07-23 MED ORDER — PROCHLORPERAZINE MALEATE 10 MG PO TABS: 10.0000 mg | ORAL_TABLET | Freq: Once | ORAL | Status: DC

## 2018-07-23 MED ORDER — DENOSUMAB 120 MG/1.7ML ~~LOC~~ SOLN
SUBCUTANEOUS | Status: AC
  Filled 2018-07-23: qty 1.7

## 2018-07-23 MED ORDER — DENOSUMAB 120 MG/1.7ML ~~LOC~~ SOLN
120.0000 mg | Freq: Once | SUBCUTANEOUS | Status: AC
  Administered 2018-07-23: 120 mg via SUBCUTANEOUS

## 2018-07-23 NOTE — Patient Instructions (Signed)
[facility] Discharge Instructions for Patients Receiving Chemotherapy  Today you received the following chemotherapy agents Velcade. To help prevent nausea and vomiting after your treatment, we encourage you to take your nausea medication as directed.  If you develop nausea and vomiting that is not controlled by your nausea medication, call the clinic.   BELOW ARE SYMPTOMS THAT SHOULD BE REPORTED IMMEDIATELY:  *FEVER GREATER THAN 100.5 F  *CHILLS WITH OR WITHOUT FEVER  NAUSEA AND VOMITING THAT IS NOT CONTROLLED WITH YOUR NAUSEA MEDICATION  *UNUSUAL SHORTNESS OF BREATH  *UNUSUAL BRUISING OR BLEEDING  TENDERNESS IN MOUTH AND THROAT WITH OR WITHOUT PRESENCE OF ULCERS  *URINARY PROBLEMS  *BOWEL PROBLEMS  UNUSUAL RASH Items with * indicate a potential emergency and should be followed up as soon as possible.  Feel free to call the clinic you have any questions or concerns. The clinic phone number is [phone].  Please show the CHEMO ALERT CARD at check-in to the Emergency Department and triage nurse.    

## 2018-07-23 NOTE — Telephone Encounter (Signed)
Appointments scheduled per 07/23/2018 los

## 2018-07-23 NOTE — Progress Notes (Signed)
Hematology and Oncology Follow Up Visit  Debra Ruiz [id number] [birth year] 73 y.o. 07/23/2018   Principle Diagnosis:  IgG Kappa myeloma - compression fractures - 13q-, +11, 14q+ - hyperdiploid  Completed Therapy: S/p Kyphoplasty of T11 and T12 XRT to lower spine-completed on 09/25/2016  Current Therapy:   Velcade/Decadron -status post cycle #21 - Revlimid stopped on 11/06/2016 -- q 6 week cycles Xgeva 165m Sq q  3 month - next dose in April 2020   Interim History:  Ms. JPhilbrickis here today for follow-up.  We see her every 6 weeks.  She really has done well on the 6-week cycle.  When we had seen her last, there was some issues with her left eye.  The ophthalmologist thought that this was somehow related to her myeloma.  Ms. JPrintuphas had extensive work-up.  She had an MRI of the cervical spine.  She has severe spinal stenosis from C4-C6.  The spinal surgeon that she saw, Dr. DLynann Bologna did not think that anything had to be done with this and he did not think that this was related to her left ptosis.  I am however somewhat concerned about her myeloma studies.  We last saw her, she finally had a monoclonal spike of 0.1 g/dL.  Her IgG was 478 mg/dL.  Her kappa light chain was 0.9 mg/dL.  It is conceivable that her myeloma is starting to recur now.  She is also complaining of pain in the right hip.  This is been going on for a few weeks.  She says that it is not painful when she sits or lies down.  It is just painful when she walks.  We will get plain films of her left hip and femur today.  She and her husband will make their yearly trip down to [place]  They are going in a couple weeks.  They go for about a month.  Her appetite is doing okay.  She is having no problems with bowels or bladder.  She is having no issues with rashes.  There is no leg swelling.  She is having no fever.  She is having no bleeding.  Overall, her performance status is ECOG 1.    Medications:  Allergies as of  07/23/2018      Reactions   Crestor [rosuvastatin Calcium]    Causes liver functions to be elevated      Medication List       Accurate as of July 23, 2018  1:49 PM. Always use your most recent med list.        ALPRAZolam 0.25 MG tablet Commonly known as:  XANAX Take 1 tablet (0.25 mg total) by mouth 2 (two) times daily as needed. for anxiety   amLODipine 5 MG tablet Commonly known as:  NORVASC TAKE 1 TABLET(5 MG) BY MOUTH DAILY   aspirin EC 81 MG tablet Take 81 mg by mouth 2 (two) times a week.   atorvastatin 20 MG tablet Commonly known as:  LIPITOR TAKE 1 TABLET(20 MG) BY MOUTH DAILY   buPROPion 150 MG 24 hr tablet Commonly known as:  WELLBUTRIN XL Take 1 tablet (150 mg total) by mouth daily.   BYSTOLIC 5 MG tablet Generic drug:  nebivolol TAKE 1 TABLET(5 MG) BY MOUTH DAILY   dexamethasone 4 MG tablet Commonly known as:  DECADRON TAKE 3 TABLETS BY MOUTH ONCE A WEEK   escitalopram 20 MG tablet Commonly known as:  LEXAPRO TAKE 1 TABLET(20 MG) BY MOUTH DAILY  famciclovir 250 MG tablet Commonly known as:  FAMVIR Take 1 tablet (250 mg total) by mouth daily.   potassium chloride SA 20 MEQ tablet Commonly known as:  K-DUR,KLOR-CON TAKE 1 TABLET(20 MEQ) BY MOUTH DAILY   PREVNAR 13 Susp injection Generic drug:  pneumococcal 13-valent conjugate vaccine ADM 0.5ML IM UTD   prochlorperazine 10 MG tablet Commonly known as:  COMPAZINE TAKE 1 TABLET(10 MG) BY MOUTH EVERY 6 HOURS AS NEEDED FOR NAUSEA OR VOMITING   Vitamin D (Ergocalciferol) 50 MCG (2000 UT) Caps Take 2,000 Units by mouth daily.   Vitamin D 50 MCG (2000 UT) Caps Take 2,000 Units by mouth daily.       Allergies:  Allergies  Allergen Reactions  . Crestor [Rosuvastatin Calcium]     Causes liver functions to be elevated    Past Medical History, Surgical history, Social history, and Family History were reviewed and updated.  Review of Systems: Review of Systems  Constitutional: Negative.     HENT: Negative.   Eyes: Negative.   Respiratory: Negative.   Cardiovascular: Negative.   Gastrointestinal: Negative.   Genitourinary: Negative.   Musculoskeletal: Positive for back pain.  Skin: Negative.   Neurological: Negative.   Endo/Heme/Allergies: Negative.   Psychiatric/Behavioral: Negative.     Physical Exam:  weight is 118 lb (53.5 kg). Her oral temperature is 97.7 F (36.5 C). Her blood pressure is 139/67 and her pulse is 77. Her respiration is 19 and oxygen saturation is 100%.   Wt Readings from Last 3 Encounters:  07/23/18 118 lb (53.5 kg)  06/03/18 119 lb (54 kg)  05/12/18 118 lb (53.5 kg)    Physical Exam Vitals signs reviewed.  HENT:     Head: Normocephalic and atraumatic.  Eyes:     Pupils: Pupils are equal, round, and reactive to light.  Neck:     Musculoskeletal: Normal range of motion.  Cardiovascular:     Rate and Rhythm: Normal rate and regular rhythm.     Heart sounds: Normal heart sounds.  Pulmonary:     Effort: Pulmonary effort is normal.     Breath sounds: Normal breath sounds.  Abdominal:     General: Bowel sounds are normal.     Palpations: Abdomen is soft.  Musculoskeletal: Normal range of motion.        General: No tenderness or deformity.  Lymphadenopathy:     Cervical: No cervical adenopathy.  Skin:    General: Skin is warm and dry.     Findings: No erythema or rash.  Neurological:     Mental Status: She is alert and oriented to person, place, and time.  Psychiatric:        Behavior: Behavior normal.        Thought Content: Thought content normal.        Judgment: Judgment normal.       Lab Results  Component Value Date   WBC 8.9 07/23/2018   HGB 14.7 07/23/2018   HCT 44.8 07/23/2018   MCV 99.6 07/23/2018   PLT 239 07/23/2018   No results found for: FERRITIN, IRON, TIBC, UIBC, IRONPCTSAT Lab Results  Component Value Date   RBC 4.50 07/23/2018   Lab Results  Component Value Date   KPAFRELGTCHN 8.7 06/03/2018    LAMBDASER 11.0 06/03/2018   KAPLAMBRATIO 0.79 06/03/2018   Lab Results  Component Value Date   IGGSERUM 478 (L) 06/03/2018   IGGSERUM 527 (L) 06/03/2018   IGA 60 (L) 06/03/2018   IGA 62 (L)  06/03/2018   IGMSERUM 69 06/03/2018   IGMSERUM 72 06/03/2018   Lab Results  Component Value Date   TOTALPROTELP 6.6 06/03/2018   ALBUMINELP 4.1 06/03/2018   A1GS 0.3 06/03/2018   A2GS 0.8 06/03/2018   BETS 1.1 06/03/2018   GAMS 0.4 06/03/2018   MSPIKE 0.1 (H) 06/03/2018   SPEI Comment 01/28/2018     Chemistry      Component Value Date/Time   NA 143 07/23/2018 1136   NA 148 (H) 07/16/2017 1128   NA 143 03/05/2017 1051   K 4.1 07/23/2018 1136   K 3.8 07/16/2017 1128   K 3.7 03/05/2017 1051   CL 105 07/23/2018 1136   CL 106 07/16/2017 1128   CO2 29 07/23/2018 1136   CO2 29 07/16/2017 1128   CO2 25 03/05/2017 1051   BUN 14 07/23/2018 1136   BUN 23 (H) 07/16/2017 1128   BUN 18.2 03/05/2017 1051   CREATININE 0.83 07/23/2018 1136   CREATININE 0.69 05/07/2018 1233   CREATININE 0.8 03/05/2017 1051      Component Value Date/Time   CALCIUM 9.6 07/23/2018 1136   CALCIUM 9.7 07/16/2017 1128   CALCIUM 8.9 03/05/2017 1051   ALKPHOS 67 07/23/2018 1136   ALKPHOS 71 07/16/2017 1128   ALKPHOS 67 03/05/2017 1051   AST 16 07/23/2018 1136   AST 15 03/05/2017 1051   ALT 17 07/23/2018 1136   ALT 23 07/16/2017 1128   ALT 17 03/05/2017 1051   BILITOT 0.6 07/23/2018 1136   BILITOT 0.55 03/05/2017 1051      Impression and Plan: Ms. Aja is a very pleasant 73 yo caucasian female with IgG Kappa Myeloma.   I think that if we do find that her M spike is increasing, then we will have to get her on pomalidomide in addition to the Velcade.  I would certainly increase the frequency of Velcade.  Her quality of life has been incredibly good with the every 6 week Velcade.  Hopefully, we will not have to interfere with her trip to [place].  We will hopefully be able to get her back after she returns  from [place].  [name], MD 1/2/20201:49 PM

## 2018-07-24 ENCOUNTER — Telehealth: Payer: Self-pay | Admitting: *Deleted

## 2018-07-24 LAB — IRON AND TIBC
Iron: 138 ug/dL (ref 41–142)
Saturation Ratios: 36 % (ref 21–57)
TIBC: 381 ug/dL (ref 236–444)
UIBC: 244 ug/dL (ref 120–384)

## 2018-07-24 LAB — VITAMIN D 25 HYDROXY (VIT D DEFICIENCY, FRACTURES): Vit D, 25-Hydroxy: 37.7 ng/mL (ref 30.0–100.0)

## 2018-07-24 LAB — FERRITIN: Ferritin: 28 ng/mL (ref 11–307)

## 2018-07-24 NOTE — Telephone Encounter (Addendum)
Patient is aware of results.   ----- Message from [name], MD sent at 07/24/2018 10:39 AM EST ----- Call - the X-ray looks okay.  No obvious lytic lesion from myeloma!!  Debra Ruiz

## 2018-07-30 ENCOUNTER — Telehealth: Payer: Self-pay | Admitting: Internal Medicine

## 2018-07-30 MED ORDER — BUPROPION HCL ER (XL) 300 MG PO TB24: 300.0000 mg | ORAL_TABLET | Freq: Every day | ORAL | 1 refills | Status: DC

## 2018-07-30 NOTE — Telephone Encounter (Signed)
Pt was notified of Dr. Baxley's instructions, pt verbalized understanding.   

## 2018-07-30 NOTE — Telephone Encounter (Signed)
Patient states that she doesn't think the Wellbutrin dose of 150mg  is doing much for her.  She wants to know if you think she needs to increase the dosage or try something else?  She is almost out of the 90 day supply that you gave her.  So, she's asking before time to refill.  She thinks it has helped some, but she's not getting the results that she thought she would.    Pharmacy:  [facility] at [place]  Phone:  [phone]

## 2018-07-30 NOTE — Telephone Encounter (Signed)
Increase Wellbutrin to 300 mg daily and follow up here in 6 weeks.

## 2018-08-03 ENCOUNTER — Inpatient Hospital Stay: Payer: PPO

## 2018-08-03 DIAGNOSIS — Z5112 Encounter for antineoplastic immunotherapy: Secondary | ICD-10-CM | POA: Diagnosis not present

## 2018-08-03 DIAGNOSIS — C9000 Multiple myeloma not having achieved remission: Secondary | ICD-10-CM

## 2018-08-04 LAB — IGG, IGA, IGM
IGM (IMMUNOGLOBULIN M), SRM: 32 mg/dL (ref 26–217)
IgA: 33 mg/dL — ABNORMAL LOW (ref 64–422)
IgG (Immunoglobin G), Serum: 460 mg/dL — ABNORMAL LOW (ref 700–1600)

## 2018-08-04 LAB — KAPPA/LAMBDA LIGHT CHAINS
Kappa free light chain: 13.8 mg/L (ref 3.3–19.4)
Kappa, lambda light chain ratio: 2.6 — ABNORMAL HIGH (ref 0.26–1.65)
Lambda free light chains: 5.3 mg/L — ABNORMAL LOW (ref 5.7–26.3)

## 2018-08-06 LAB — IMMUNOFIXATION REFLEX, SERUM
IgA: 33 mg/dL — ABNORMAL LOW (ref 64–422)
IgG (Immunoglobin G), Serum: 471 mg/dL — ABNORMAL LOW (ref 700–1600)
IgM (Immunoglobulin M), Srm: 36 mg/dL (ref 26–217)

## 2018-08-06 LAB — PROTEIN ELECTROPHORESIS, SERUM, WITH REFLEX
A/G Ratio: 1.7 (ref 0.7–1.7)
Albumin ELP: 3.8 g/dL (ref 2.9–4.4)
Alpha-1-Globulin: 0.2 g/dL (ref 0.0–0.4)
Alpha-2-Globulin: 0.7 g/dL (ref 0.4–1.0)
Beta Globulin: 0.9 g/dL (ref 0.7–1.3)
Gamma Globulin: 0.4 g/dL (ref 0.4–1.8)
Globulin, Total: 2.2 g/dL (ref 2.2–3.9)
M-Spike, %: 0.2 g/dL — ABNORMAL HIGH
SPEP Interpretation: 0
Total Protein ELP: 6 g/dL (ref 6.0–8.5)

## 2018-08-11 ENCOUNTER — Other Ambulatory Visit: Payer: Self-pay | Admitting: Family

## 2018-08-18 ENCOUNTER — Other Ambulatory Visit: Payer: Self-pay | Admitting: *Deleted

## 2018-08-18 ENCOUNTER — Inpatient Hospital Stay (HOSPITAL_BASED_OUTPATIENT_CLINIC_OR_DEPARTMENT_OTHER): Payer: PPO | Admitting: Hematology & Oncology

## 2018-08-18 ENCOUNTER — Inpatient Hospital Stay: Payer: PPO

## 2018-08-18 ENCOUNTER — Telehealth: Payer: Self-pay | Admitting: Hematology & Oncology

## 2018-08-18 VITALS — BP 152/70 | HR 80 | Temp 98.1°F | Resp 18 | Wt 118.8 lb

## 2018-08-18 DIAGNOSIS — Z5112 Encounter for antineoplastic immunotherapy: Secondary | ICD-10-CM | POA: Diagnosis not present

## 2018-08-18 DIAGNOSIS — C9000 Multiple myeloma not having achieved remission: Secondary | ICD-10-CM

## 2018-08-18 LAB — CBC WITH DIFFERENTIAL (CANCER CENTER ONLY)
Abs Immature Granulocytes: 0.05 10*3/uL (ref 0.00–0.07)
Basophils Absolute: 0 10*3/uL (ref 0.0–0.1)
Basophils Relative: 0 %
EOS ABS: 0 10*3/uL (ref 0.0–0.5)
Eosinophils Relative: 0 %
HCT: 41.1 % (ref 36.0–46.0)
Hemoglobin: 13.8 g/dL (ref 12.0–15.0)
Immature Granulocytes: 1 %
Lymphocytes Relative: 14 %
Lymphs Abs: 1.1 10*3/uL (ref 0.7–4.0)
MCH: 33.2 pg (ref 26.0–34.0)
MCHC: 33.6 g/dL (ref 30.0–36.0)
MCV: 98.8 fL (ref 80.0–100.0)
Monocytes Absolute: 0.8 10*3/uL (ref 0.1–1.0)
Monocytes Relative: 10 %
Neutro Abs: 6.1 10*3/uL (ref 1.7–7.7)
Neutrophils Relative %: 75 %
Platelet Count: 246 10*3/uL (ref 150–400)
RBC: 4.16 MIL/uL (ref 3.87–5.11)
RDW: 12.4 % (ref 11.5–15.5)
WBC Count: 8.2 10*3/uL (ref 4.0–10.5)
nRBC: 0 % (ref 0.0–0.2)

## 2018-08-18 LAB — CMP (CANCER CENTER ONLY)
ALK PHOS: 64 U/L (ref 38–126)
ALT: 17 U/L (ref 0–44)
AST: 17 U/L (ref 15–41)
Albumin: 4.9 g/dL (ref 3.5–5.0)
Anion gap: 11 (ref 5–15)
BUN: 19 mg/dL (ref 8–23)
CALCIUM: 10.5 mg/dL — AB (ref 8.9–10.3)
CO2: 27 mmol/L (ref 22–32)
Chloride: 101 mmol/L (ref 98–111)
Creatinine: 0.77 mg/dL (ref 0.44–1.00)
GFR, Est AFR Am: >60 mL/min (ref >60)
GFR, Estimated: >60 mL/min (ref >60)
Glucose, Bld: 115 mg/dL — ABNORMAL HIGH (ref 70–99)
Potassium: 4 mmol/L (ref 3.5–5.1)
Sodium: 139 mmol/L (ref 135–145)
Total Bilirubin: 0.5 mg/dL (ref 0.3–1.2)
Total Protein: 7 g/dL (ref 6.5–8.1)

## 2018-08-18 LAB — LACTATE DEHYDROGENASE: LDH: 250 U/L — ABNORMAL HIGH (ref 98–192)

## 2018-08-18 MED ORDER — POMALIDOMIDE 3 MG PO CAPS: 3.0000 mg | ORAL_CAPSULE | Freq: Every day | ORAL | 0 refills | Status: DC

## 2018-08-18 MED ORDER — POMALIDOMIDE 3 MG PO CAPS: 3.0000 mg | ORAL_CAPSULE | Freq: Every day | ORAL | 4 refills | Status: DC

## 2018-08-18 NOTE — Telephone Encounter (Signed)
Appointments scheduled calendar printed per 1/28 los 

## 2018-08-18 NOTE — Progress Notes (Signed)
Hematology and Oncology Follow Up Visit  Debra Ruiz [id number] [birth year] 72 y.o. 08/18/2018   Principle Diagnosis:  IgG Kappa myeloma - compression fractures - 13q-, +11, 14q+ - hyperdiploid -- progressive  Completed Therapy: S/p Kyphoplasty of T11 and T12 XRT to lower spine-completed on 09/25/2016  Current Therapy:   Velcade/Pomalyst/Decadron -Start on 09/02/2018 Xgeva 166m Sq q  3 month - next dose in April 2020   Interim History:  Debra Ruiz here today for an early visit.  Unfortunately, looks like that her myeloma is trying to come back.  When we last saw her in early January, her M spike was 0.2 g/dL.  Her IgG level was 486 mg/dL.  Her kappa light chain was 1.4 mg/dL.  Debra Ruiz and her husband just got back from a trip to [place]  There was go down there in January.  They had a wonderful time down there.  Debra Ruiz does not want to have any changes in her protocol until after Debra Ruiz has her Valentine's party on August 28, 2018.  I think what we need to do is just to start her on pomalidomide.  I think this would be reasonable.  I would start her on 3 mg daily (21 days on and 10 days off).  I think we also need to increase the frequency of her Velcade treatments to every 3 weeks.  I went over the side effects of pomalidomide.  Hopefully, Debra Ruiz will not have issues affording this.  I know with the Revlimid that we had her on, we had to have her on a grant program from the company in order for her to afford it.  Debra Ruiz is having no problems with bone pain although the Debra Ruiz is having more issues with her right hip.  We did do a plain x-ray of her right hip/femur a few weeks ago.  There was some "subtle" lesions.  I am not sure exactly what this signifies.  We will have to get an MRI for better detail.  I told her that since we are having her back on pomalidomide, that we need to have her take a full dose aspirin (325 mg) daily.  Debra Ruiz is aware of this.  Debra Ruiz has had no problems with bowels or  bladder.  Debra Ruiz has had no problems with cough.  There is been no leg swelling.  Debra Ruiz has had no rashes.  Debra Ruiz has had no headache.  Overall, her performance status is ECOG 1.    Medications:  Allergies as of 08/18/2018      Reactions   Crestor [rosuvastatin Calcium]    Causes liver functions to be elevated      Medication List       Accurate as of August 18, 2018 12:21 PM. Always use your most recent med list.        ALPRAZolam 0.25 MG tablet Commonly known as:  XANAX Take 1 tablet (0.25 mg total) by mouth 2 (two) times daily as needed. for anxiety   amLODipine 5 MG tablet Commonly known as:  NORVASC TAKE 1 TABLET(5 MG) BY MOUTH DAILY   aspirin EC 81 MG tablet Take 81 mg by mouth 2 (two) times a week.   atorvastatin 20 MG tablet Commonly known as:  LIPITOR TAKE 1 TABLET(20 MG) BY MOUTH DAILY   buPROPion 300 MG 24 hr tablet Commonly known as:  WELLBUTRIN XL Take 1 tablet (300 mg total) by mouth daily.   BYSTOLIC 5 MG tablet Generic drug:  nebivolol TAKE 1  TABLET(5 MG) BY MOUTH DAILY   dexamethasone 4 MG tablet Commonly known as:  DECADRON TAKE 3 TABLETS BY MOUTH ONCE A WEEK   escitalopram 20 MG tablet Commonly known as:  LEXAPRO TAKE 1 TABLET(20 MG) BY MOUTH DAILY   famciclovir 250 MG tablet Commonly known as:  FAMVIR Take 1 tablet (250 mg total) by mouth daily.   potassium chloride SA 20 MEQ tablet Commonly known as:  K-DUR,KLOR-CON TAKE 1 TABLET(20 MEQ) BY MOUTH DAILY   PREVNAR 13 Susp injection Generic drug:  pneumococcal 13-valent conjugate vaccine ADM 0.5ML IM UTD   prochlorperazine 10 MG tablet Commonly known as:  COMPAZINE TAKE 1 TABLET(10 MG) BY MOUTH EVERY 6 HOURS AS NEEDED FOR NAUSEA OR VOMITING   Vitamin D (Ergocalciferol) 50 MCG (2000 UT) Caps Take 2,000 Units by mouth daily.   Vitamin D 50 MCG (2000 UT) Caps Take 2,000 Units by mouth daily.       Allergies:  Allergies  Allergen Reactions  . Crestor [Rosuvastatin Calcium]      Causes liver functions to be elevated    Past Medical History, Surgical history, Social history, and Family History were reviewed and updated.  Review of Systems: Review of Systems  Constitutional: Negative.   HENT: Negative.   Eyes: Negative.   Respiratory: Negative.   Cardiovascular: Negative.   Gastrointestinal: Negative.   Genitourinary: Negative.   Musculoskeletal: Positive for back pain.  Skin: Negative.   Neurological: Negative.   Endo/Heme/Allergies: Negative.   Psychiatric/Behavioral: Negative.     Physical Exam:  weight is 118 lb 12 oz (53.9 kg). Her oral temperature is 98.1 F (36.7 C). Her blood pressure is 152/70 (abnormal) and her pulse is 80. Her respiration is 18 and oxygen saturation is 100%.   Wt Readings from Last 3 Encounters:  08/18/18 118 lb 12 oz (53.9 kg)  07/23/18 118 lb (53.5 kg)  06/03/18 119 lb (54 kg)    Physical Exam Vitals signs reviewed.  HENT:     Head: Normocephalic and atraumatic.  Eyes:     Pupils: Pupils are equal, round, and reactive to light.  Neck:     Musculoskeletal: Normal range of motion.  Cardiovascular:     Rate and Rhythm: Normal rate and regular rhythm.     Heart sounds: Normal heart sounds.  Pulmonary:     Effort: Pulmonary effort is normal.     Breath sounds: Normal breath sounds.  Abdominal:     General: Bowel sounds are normal.     Palpations: Abdomen is soft.  Musculoskeletal: Normal range of motion.        General: No tenderness or deformity.  Lymphadenopathy:     Cervical: No cervical adenopathy.  Skin:    General: Skin is warm and dry.     Findings: No erythema or rash.  Neurological:     Mental Status: Debra Ruiz is alert and oriented to person, place, and time.  Psychiatric:        Behavior: Behavior normal.        Thought Content: Thought content normal.        Judgment: Judgment normal.       Lab Results  Component Value Date   WBC 8.2 08/18/2018   HGB 13.8 08/18/2018   HCT 41.1 08/18/2018   MCV  98.8 08/18/2018   PLT 246 08/18/2018   Lab Results  Component Value Date   FERRITIN 28 07/23/2018   IRON 138 07/23/2018   TIBC 381 07/23/2018   UIBC 244 07/23/2018  IRONPCTSAT 36 07/23/2018   Lab Results  Component Value Date   RBC 4.16 08/18/2018   Lab Results  Component Value Date   KPAFRELGTCHN 13.8 08/03/2018   LAMBDASER 5.3 (L) 08/03/2018   KAPLAMBRATIO 2.60 (H) 08/03/2018   Lab Results  Component Value Date   IGGSERUM 460 (L) 08/03/2018   IGGSERUM 471 (L) 08/03/2018   IGA 33 (L) 08/03/2018   IGA 33 (L) 08/03/2018   IGMSERUM 32 08/03/2018   IGMSERUM 36 08/03/2018   Lab Results  Component Value Date   TOTALPROTELP 6.0 08/03/2018   ALBUMINELP 3.8 08/03/2018   A1GS 0.2 08/03/2018   A2GS 0.7 08/03/2018   BETS 0.9 08/03/2018   GAMS 0.4 08/03/2018   MSPIKE 0.2 (H) 08/03/2018   SPEI Comment 01/28/2018     Chemistry      Component Value Date/Time   NA 143 07/23/2018 1136   NA 148 (H) 07/16/2017 1128   NA 143 03/05/2017 1051   K 4.1 07/23/2018 1136   K 3.8 07/16/2017 1128   K 3.7 03/05/2017 1051   CL 105 07/23/2018 1136   CL 106 07/16/2017 1128   CO2 29 07/23/2018 1136   CO2 29 07/16/2017 1128   CO2 25 03/05/2017 1051   BUN 14 07/23/2018 1136   BUN 23 (H) 07/16/2017 1128   BUN 18.2 03/05/2017 1051   CREATININE 0.83 07/23/2018 1136   CREATININE 0.69 05/07/2018 1233   CREATININE 0.8 03/05/2017 1051      Component Value Date/Time   CALCIUM 9.6 07/23/2018 1136   CALCIUM 9.7 07/16/2017 1128   CALCIUM 8.9 03/05/2017 1051   ALKPHOS 67 07/23/2018 1136   ALKPHOS 71 07/16/2017 1128   ALKPHOS 67 03/05/2017 1051   AST 16 07/23/2018 1136   AST 15 03/05/2017 1051   ALT 17 07/23/2018 1136   ALT 23 07/16/2017 1128   ALT 17 03/05/2017 1051   BILITOT 0.6 07/23/2018 1136   BILITOT 0.55 03/05/2017 1051      Impression and Plan: Debra Ruiz is a very pleasant 73 yo caucasian female with IgG Kappa Myeloma.  Debra Ruiz really has done so well.  We have been following her  now for over a couple years.  We had a very conservative protocol.  Now, we are finding that the myeloma is beginning to "reactivate."  I do not think we need to do a bone marrow test on her.  We will just start her on pomalidomide.  We will increase the frequency of Velcade to every 3 week intervals.  Debra Ruiz is still on the Decadron at 12 mg p.o. weekly.  We will get her back here on February 12 the day that Debra Ruiz starts her Velcade.  Hopefully, Debra Ruiz will have started the pomalidomide.  I spent about 45 minutes with Debra Ruiz and her husband.  I spent all the time face-to-face with them.  I reviewed her lab work.  I arranged for her chemotherapy appointments.  I help coordinate her future care and I help set of the MRI that Debra Ruiz needs of her right hip/femur.  [name], MD 1/28/202012:21 PM

## 2018-08-19 ENCOUNTER — Telehealth: Payer: Self-pay | Admitting: Pharmacist

## 2018-08-19 LAB — IGG, IGA, IGM
IgA: 37 mg/dL — ABNORMAL LOW (ref 64–422)
IgG (Immunoglobin G), Serum: 522 mg/dL — ABNORMAL LOW (ref 700–1600)
IgM (Immunoglobulin M), Srm: 34 mg/dL (ref 26–217)

## 2018-08-19 LAB — KAPPA/LAMBDA LIGHT CHAINS
Kappa free light chain: 11.1 mg/L (ref 3.3–19.4)
Kappa, lambda light chain ratio: 3.83 — ABNORMAL HIGH (ref 0.26–1.65)
Lambda free light chains: 2.9 mg/L — ABNORMAL LOW (ref 5.7–26.3)

## 2018-08-19 NOTE — Telephone Encounter (Signed)
Oral Oncology Pharmacist Encounter  Received new prescription for Pomalyst (pomalidomide) for the treatment of multiple myeloma in conjunction with bortezomib and dexamethasone, planned duration until disease progression or unacceptable drug toxicity.  CBC/CMP from 08/18/18 assessed, no relevant lab abnormalities. Prescription dose and frequency assessed.   Current medication list in Epic reviewed, no relevant DDIs with pomalidomide identified.  Prescription has been e-scribed to the [facility] for benefits analysis and approval.  Oral Oncology Clinic will continue to follow for insurance authorization, copayment issues, initial counseling and start date.  Darl Pikes, PharmD, BCPS, [facility] Hematology/Oncology Clinical Pharmacist [facility] [phone]  08/19/2018 3:31 PM

## 2018-08-20 ENCOUNTER — Telehealth: Payer: Self-pay | Admitting: Pharmacy Technician

## 2018-08-20 NOTE — Telephone Encounter (Signed)
Oral Oncology Patient Advocate Encounter  Was successful in securing patient a $10,000 grant from [facility] to provide copayment coverage for Pomalyst.  This will keep the out of pocket expense at $0.     [employer] ID: [id number]  I have spoken with the patient..     The billing information is as follows and has been shared with [facility].    RxBin: [id number] PCN: [id number] Member ID: [id number] Group ID: [id number] Dates of Eligibility: 07/21/2018 through 07/21/2019.  [place] [facility] [place] Phone [phone] Fax [phone] 08/20/2018 3:00 PM

## 2018-08-21 LAB — PROTEIN ELECTROPHORESIS, SERUM, WITH REFLEX
A/G Ratio: 1.7 (ref 0.7–1.7)
Albumin ELP: 4.1 g/dL (ref 2.9–4.4)
Alpha-1-Globulin: 0.3 g/dL (ref 0.0–0.4)
Alpha-2-Globulin: 0.8 g/dL (ref 0.4–1.0)
BETA GLOBULIN: 1 g/dL (ref 0.7–1.3)
Gamma Globulin: 0.4 g/dL (ref 0.4–1.8)
Globulin, Total: 2.4 g/dL (ref 2.2–3.9)
M-Spike, %: 0.2 g/dL — ABNORMAL HIGH
SPEP INTERP: 0
Total Protein ELP: 6.5 g/dL (ref 6.0–8.5)

## 2018-08-21 LAB — IMMUNOFIXATION REFLEX, SERUM
IgA: 39 mg/dL — ABNORMAL LOW (ref 64–422)
IgG (Immunoglobin G), Serum: 574 mg/dL — ABNORMAL LOW (ref 700–1600)
IgM (Immunoglobulin M), Srm: 35 mg/dL (ref 26–217)

## 2018-08-25 ENCOUNTER — Ambulatory Visit
Admission: RE | Admit: 2018-08-25 | Discharge: 2018-08-25 | Disposition: A | Discharge status: Home / Self Care | Payer: PPO | Source: Ambulatory Visit | Attending: Hematology & Oncology | Admitting: Hematology & Oncology

## 2018-08-25 DIAGNOSIS — C9000 Multiple myeloma not having achieved remission: Secondary | ICD-10-CM

## 2018-08-25 DIAGNOSIS — S79921A Unspecified injury of right thigh, initial encounter: Secondary | ICD-10-CM | POA: Diagnosis not present

## 2018-08-25 MED ORDER — GADOBENATE DIMEGLUMINE 529 MG/ML IV SOLN
10.0000 mL | Freq: Once | INTRAVENOUS | Status: AC | PRN
  Administered 2018-08-25: 10 mL via INTRAVENOUS

## 2018-08-26 ENCOUNTER — Telehealth: Payer: Self-pay

## 2018-08-26 NOTE — Telephone Encounter (Addendum)
Left message on personalized VM to contact office for MR results. dph  ----- Message from [name], MD sent at 08/26/2018  6:22 AM EST ----- Call - no myeloma bone lesions in the right hip/thigh.  You do have a teat in a tendon!!!  You need to see your orthopedist!!!  Debra Ruiz

## 2018-08-26 NOTE — Telephone Encounter (Addendum)
Below message given to pt who verbalizes understanding. Pt has an appt with Dr Marlon Pel this month. MR results faxed to ortho office at pt request. dph  ----- Message from [name], MD sent at 08/26/2018  6:22 AM EST ----- Call - no myeloma bone lesions in the right hip/thigh.  You do have a teat in a tendon!!!  You need to see your orthopedist!!!  [name]

## 2018-09-01 ENCOUNTER — Inpatient Hospital Stay (HOSPITAL_BASED_OUTPATIENT_CLINIC_OR_DEPARTMENT_OTHER): Payer: PPO | Admitting: Hematology & Oncology

## 2018-09-01 ENCOUNTER — Inpatient Hospital Stay: Payer: PPO

## 2018-09-01 ENCOUNTER — Encounter: Payer: Self-pay | Admitting: Hematology & Oncology

## 2018-09-01 ENCOUNTER — Inpatient Hospital Stay: Payer: PPO | Attending: Hematology & Oncology

## 2018-09-01 ENCOUNTER — Other Ambulatory Visit: Payer: Self-pay

## 2018-09-01 VITALS — BP 135/85 | HR 98 | Temp 97.6°F | Resp 18 | Wt 117.0 lb

## 2018-09-01 DIAGNOSIS — C9000 Multiple myeloma not having achieved remission: Secondary | ICD-10-CM

## 2018-09-01 DIAGNOSIS — Z5112 Encounter for antineoplastic immunotherapy: Secondary | ICD-10-CM | POA: Diagnosis not present

## 2018-09-01 DIAGNOSIS — Z7982 Long term (current) use of aspirin: Secondary | ICD-10-CM | POA: Insufficient documentation

## 2018-09-01 DIAGNOSIS — Z79899 Other long term (current) drug therapy: Secondary | ICD-10-CM | POA: Diagnosis not present

## 2018-09-01 DIAGNOSIS — Z923 Personal history of irradiation: Secondary | ICD-10-CM

## 2018-09-01 LAB — CBC WITH DIFFERENTIAL (CANCER CENTER ONLY)
Abs Immature Granulocytes: 0.11 10*3/uL — ABNORMAL HIGH (ref 0.00–0.07)
Basophils Absolute: 0 10*3/uL (ref 0.0–0.1)
Basophils Relative: 0 %
Eosinophils Absolute: 0 10*3/uL (ref 0.0–0.5)
Eosinophils Relative: 0 %
HCT: 40.1 % (ref 36.0–46.0)
Hemoglobin: 13.5 g/dL (ref 12.0–15.0)
Immature Granulocytes: 1 %
Lymphocytes Relative: 11 %
Lymphs Abs: 1 10*3/uL (ref 0.7–4.0)
MCH: 33.1 pg (ref 26.0–34.0)
MCHC: 33.7 g/dL (ref 30.0–36.0)
MCV: 98.3 fL (ref 80.0–100.0)
Monocytes Absolute: 0.8 10*3/uL (ref 0.1–1.0)
Monocytes Relative: 8 %
Neutro Abs: 7.4 10*3/uL (ref 1.7–7.7)
Neutrophils Relative %: 80 %
Platelet Count: 268 10*3/uL (ref 150–400)
RBC: 4.08 MIL/uL (ref 3.87–5.11)
RDW: 12.3 % (ref 11.5–15.5)
WBC: 9.3 10*3/uL (ref 4.0–10.5)
nRBC: 0 % (ref 0.0–0.2)

## 2018-09-01 LAB — CMP (CANCER CENTER ONLY)
ALK PHOS: 64 U/L (ref 38–126)
ALT: 15 U/L (ref 0–44)
AST: 17 U/L (ref 15–41)
Albumin: 4.9 g/dL (ref 3.5–5.0)
Anion gap: 10 (ref 5–15)
BUN: 23 mg/dL (ref 8–23)
CO2: 28 mmol/L (ref 22–32)
Calcium: 10 mg/dL (ref 8.9–10.3)
Chloride: 103 mmol/L (ref 98–111)
Creatinine: 0.85 mg/dL (ref 0.44–1.00)
GFR, Est AFR Am: >60 mL/min (ref >60)
GFR, Estimated: >60 mL/min (ref >60)
Glucose, Bld: 164 mg/dL — ABNORMAL HIGH (ref 70–99)
Potassium: 4.3 mmol/L (ref 3.5–5.1)
SODIUM: 141 mmol/L (ref 135–145)
Total Bilirubin: 0.5 mg/dL (ref 0.3–1.2)
Total Protein: 6.8 g/dL (ref 6.5–8.1)

## 2018-09-01 MED ORDER — PROCHLORPERAZINE MALEATE 10 MG PO TABS: 10.0000 mg | ORAL_TABLET | Freq: Once | ORAL | Status: DC

## 2018-09-01 MED ORDER — BORTEZOMIB CHEMO SQ INJECTION 3.5 MG (2.5MG/ML)
1.3000 mg/m2 | Freq: Once | INTRAMUSCULAR | Status: AC
  Administered 2018-09-01: 2 mg via SUBCUTANEOUS
  Filled 2018-09-01: qty 0.8

## 2018-09-01 NOTE — Progress Notes (Signed)
Hematology and Oncology Follow Up Visit  Debra Ruiz [id number] Jul 04, 1946 72 y.o. 09/01/2018   Principle Diagnosis:  IgG Kappa myeloma - compression fractures - 13q-, +11, 14q+ - hyperdiploid -- progressive  Completed Therapy: S/p Kyphoplasty of T11 and T12 XRT to lower spine-completed on 09/25/2016  Current Therapy:   Velcade/Pomalyst/Decadron -Start on 09/02/2018 Xgeva '120mg'$  Sq q  3 month - next dose in April 2020   Interim History:  Debra Ruiz is here today for follow-up.  She is doing okay.  She did not want to start the pomalidomide until after her Valentine's party.  She had a very nice party last Friday.  She showed me the pictures.  As such, she started the pomalidomide over the weekend.  She has had no problems so far.  There is been no fever.  She has had no cough.  She is had no leg swelling.  She is had no rashes.  She had an MRI done of her right femur back on 08/25/2018.  This showed no focal lesion in the right femur.  She did have tendinosis and partial tear of the right gluteus minimus tendon with some bursitis of the right greater trochanter.  She is seen orthopedic surgery for this.  Thankfully, her last myeloma studies did not look any worse.  Her M spike was 0.2 g/dL.  Currently, her performance status is ECOG 1.    Medications:  Allergies as of 09/01/2018      Reactions   Crestor [rosuvastatin Calcium]    Causes liver functions to be elevated      Medication List       Accurate as of September 01, 2018 12:26 PM. Always use your most recent med list.        ALPRAZolam 0.25 MG tablet Commonly known as:  XANAX Take 1 tablet (0.25 mg total) by mouth 2 (two) times daily as needed. for anxiety   amLODipine 5 MG tablet Commonly known as:  NORVASC TAKE 1 TABLET(5 MG) BY MOUTH DAILY   aspirin EC 81 MG tablet Take 81 mg by mouth 2 (two) times a week.   atorvastatin 20 MG tablet Commonly known as:  LIPITOR TAKE 1 TABLET(20 MG) BY MOUTH DAILY     buPROPion 300 MG 24 hr tablet Commonly known as:  WELLBUTRIN XL Take 1 tablet (300 mg total) by mouth daily.   BYSTOLIC 5 MG tablet Generic drug:  nebivolol TAKE 1 TABLET(5 MG) BY MOUTH DAILY   dexamethasone 4 MG tablet Commonly known as:  DECADRON TAKE 3 TABLETS BY MOUTH ONCE A WEEK   escitalopram 20 MG tablet Commonly known as:  LEXAPRO TAKE 1 TABLET(20 MG) BY MOUTH DAILY   famciclovir 250 MG tablet Commonly known as:  FAMVIR Take 1 tablet (250 mg total) by mouth daily.   pomalidomide 3 MG capsule Commonly known as:  POMALYST Take 1 capsule (3 mg total) by mouth daily. Take with water on days 1-21. Repeat every 28 days. [id number]   potassium chloride SA 20 MEQ tablet Commonly known as:  K-DUR,KLOR-CON TAKE 1 TABLET(20 MEQ) BY MOUTH DAILY   PREVNAR 13 Susp injection Generic drug:  pneumococcal 13-valent conjugate vaccine ADM 0.5ML IM UTD   prochlorperazine 10 MG tablet Commonly known as:  COMPAZINE TAKE 1 TABLET(10 MG) BY MOUTH EVERY 6 HOURS AS NEEDED FOR NAUSEA OR VOMITING   Vitamin D (Ergocalciferol) 50 MCG (2000 UT) Caps Take 2,000 Units by mouth daily.   Vitamin D 50 MCG (2000 UT) Caps Take  2,000 Units by mouth daily.       Allergies:  Allergies  Allergen Reactions  . Crestor [Rosuvastatin Calcium]     Causes liver functions to be elevated    Past Medical History, Surgical history, Social history, and Family History were reviewed and updated.  Review of Systems: Review of Systems  Constitutional: Negative.   HENT: Negative.   Eyes: Negative.   Respiratory: Negative.   Cardiovascular: Negative.   Gastrointestinal: Negative.   Genitourinary: Negative.   Musculoskeletal: Positive for back pain.  Skin: Negative.   Neurological: Negative.   Endo/Heme/Allergies: Negative.   Psychiatric/Behavioral: Negative.     Physical Exam:  weight is 117 lb (53.1 kg). Her oral temperature is 97.6 F (36.4 C). Her blood pressure is 135/85 and her pulse is  98. Her respiration is 18 and oxygen saturation is 97%.   Wt Readings from Last 3 Encounters:  09/01/18 117 lb (53.1 kg)  08/18/18 118 lb 12 oz (53.9 kg)  07/23/18 118 lb (53.5 kg)    Physical Exam Vitals signs reviewed.  HENT:     Head: Normocephalic and atraumatic.  Eyes:     Pupils: Pupils are equal, round, and reactive to light.  Neck:     Musculoskeletal: Normal range of motion.  Cardiovascular:     Rate and Rhythm: Normal rate and regular rhythm.     Heart sounds: Normal heart sounds.  Pulmonary:     Effort: Pulmonary effort is normal.     Breath sounds: Normal breath sounds.  Abdominal:     General: Bowel sounds are normal.     Palpations: Abdomen is soft.  Musculoskeletal: Normal range of motion.        General: No tenderness or deformity.  Lymphadenopathy:     Cervical: No cervical adenopathy.  Skin:    General: Skin is warm and dry.     Findings: No erythema or rash.  Neurological:     Mental Status: She is alert and oriented to person, place, and time.  Psychiatric:        Behavior: Behavior normal.        Thought Content: Thought content normal.        Judgment: Judgment normal.       Lab Results  Component Value Date   WBC 9.3 09/01/2018   HGB 13.5 09/01/2018   HCT 40.1 09/01/2018   MCV 98.3 09/01/2018   PLT 268 09/01/2018   Lab Results  Component Value Date   FERRITIN 28 07/23/2018   IRON 138 07/23/2018   TIBC 381 07/23/2018   UIBC 244 07/23/2018   IRONPCTSAT 36 07/23/2018   Lab Results  Component Value Date   RBC 4.08 09/01/2018   Lab Results  Component Value Date   KPAFRELGTCHN 11.1 08/18/2018   LAMBDASER 2.9 (L) 08/18/2018   KAPLAMBRATIO 3.83 (H) 08/18/2018   Lab Results  Component Value Date   IGGSERUM 522 (L) 08/18/2018   IGGSERUM 574 (L) 08/18/2018   IGA 37 (L) 08/18/2018   IGA 39 (L) 08/18/2018   IGMSERUM 34 08/18/2018   IGMSERUM 35 08/18/2018   Lab Results  Component Value Date   TOTALPROTELP 6.5 08/18/2018    ALBUMINELP 4.1 08/18/2018   A1GS 0.3 08/18/2018   A2GS 0.8 08/18/2018   BETS 1.0 08/18/2018   GAMS 0.4 08/18/2018   MSPIKE 0.2 (H) 08/18/2018   SPEI Comment 01/28/2018     Chemistry      Component Value Date/Time   NA 141 09/01/2018 1102  NA 148 (H) 07/16/2017 1128   NA 143 03/05/2017 1051   K 4.3 09/01/2018 1102   K 3.8 07/16/2017 1128   K 3.7 03/05/2017 1051   CL 103 09/01/2018 1102   CL 106 07/16/2017 1128   CO2 28 09/01/2018 1102   CO2 29 07/16/2017 1128   CO2 25 03/05/2017 1051   BUN 23 09/01/2018 1102   BUN 23 (H) 07/16/2017 1128   BUN 18.2 03/05/2017 1051   CREATININE 0.85 09/01/2018 1102   CREATININE 0.69 05/07/2018 1233   CREATININE 0.8 03/05/2017 1051      Component Value Date/Time   CALCIUM 10.0 09/01/2018 1102   CALCIUM 9.7 07/16/2017 1128   CALCIUM 8.9 03/05/2017 1051   ALKPHOS 64 09/01/2018 1102   ALKPHOS 71 07/16/2017 1128   ALKPHOS 67 03/05/2017 1051   AST 17 09/01/2018 1102   AST 15 03/05/2017 1051   ALT 15 09/01/2018 1102   ALT 23 07/16/2017 1128   ALT 17 03/05/2017 1051   BILITOT 0.5 09/01/2018 1102   BILITOT 0.55 03/05/2017 1051      Impression and Plan: Debra Ruiz is a very pleasant 73 yo caucasian female with IgG Kappa Myeloma.  She really has done so well.  We have been following her now for over a couple years.  We had a very conservative protocol.  Now, we are finding that the myeloma is beginning to "reactivate."  Hopefully, we will see a response when we had the pomalidomide to the Velcade.  We are increasing the frequency of Velcade.  I will plan to get her back in 3 weeks.  I would like to give the pomalidomide/Velcade protocol a good 6 weeks before we see how everything looks.  We should then see her myeloma levels improve.   [name], MD 2/11/202012:26 PM

## 2018-09-01 NOTE — Patient Instructions (Signed)

## 2018-09-02 LAB — KAPPA/LAMBDA LIGHT CHAINS
KAPPA FREE LGHT CHN: 10.9 mg/L (ref 3.3–19.4)
Kappa, lambda light chain ratio: 3.76 — ABNORMAL HIGH (ref 0.26–1.65)
Lambda free light chains: 2.9 mg/L — ABNORMAL LOW (ref 5.7–26.3)

## 2018-09-02 LAB — IGG, IGA, IGM
IgA: 37 mg/dL — ABNORMAL LOW (ref 64–422)
IgG (Immunoglobin G), Serum: 588 mg/dL — ABNORMAL LOW (ref 700–1600)
IgM (Immunoglobulin M), Srm: 31 mg/dL (ref 26–217)

## 2018-09-03 LAB — PROTEIN ELECTROPHORESIS, SERUM, WITH REFLEX
A/G Ratio: 1.5 (ref 0.7–1.7)
Albumin ELP: 4 g/dL (ref 2.9–4.4)
Alpha-1-Globulin: 0.3 g/dL (ref 0.0–0.4)
Alpha-2-Globulin: 0.8 g/dL (ref 0.4–1.0)
Beta Globulin: 1.1 g/dL (ref 0.7–1.3)
Gamma Globulin: 0.5 g/dL (ref 0.4–1.8)
Globulin, Total: 2.7 g/dL (ref 2.2–3.9)
M-Spike, %: 0.2 g/dL — ABNORMAL HIGH
SPEP Interpretation: 0
Total Protein ELP: 6.7 g/dL (ref 6.0–8.5)

## 2018-09-03 LAB — IMMUNOFIXATION REFLEX, SERUM
IgA: 40 mg/dL — ABNORMAL LOW (ref 64–422)
IgG (Immunoglobin G), Serum: 611 mg/dL — ABNORMAL LOW (ref 700–1600)
IgM (Immunoglobulin M), Srm: 34 mg/dL (ref 26–217)

## 2018-09-10 ENCOUNTER — Ambulatory Visit (INDEPENDENT_AMBULATORY_CARE_PROVIDER_SITE_OTHER): Payer: PPO | Admitting: Internal Medicine

## 2018-09-10 ENCOUNTER — Encounter: Payer: Self-pay | Admitting: Internal Medicine

## 2018-09-10 VITALS — BP 120/70 | HR 76 | Ht <= 58 in | Wt 114.0 lb

## 2018-09-10 DIAGNOSIS — F419 Anxiety disorder, unspecified: Secondary | ICD-10-CM

## 2018-09-10 DIAGNOSIS — C9002 Multiple myeloma in relapse: Secondary | ICD-10-CM | POA: Diagnosis not present

## 2018-09-10 DIAGNOSIS — F329 Major depressive disorder, single episode, unspecified: Secondary | ICD-10-CM

## 2018-09-10 DIAGNOSIS — F32A Depression, unspecified: Secondary | ICD-10-CM

## 2018-09-10 NOTE — Progress Notes (Signed)
Subjective:    Patient ID: Debra Ruiz, female    DOB: [birth year], 73 y.o.   MRN: [id number]  HPI  73 year old Female in for medication management.  Weight is  114 pounds. Pomalidomide  started by Dr. Marin Olp for myeloma and is taking 325 aspirin mg daily due to possibility of  blood clots on the medication.  She takes Bystolic for hypertension.  Samples provided. She is on Lipitor for hyperlipidemia.  She takes vitamin D supplementation.  Longstanding history of anxiety and depression.  She is on Lexapro 20 mg daily, Xanax for anxiety.  For a long time took Lexapro 10 mg daily.  Does not have a lot of energy.  Rested been a lot of the day.  Tries to get out some.  Review of Systems see above-developed left Horner's syndrome in the Fall 2019.  Has severe disc space loss C3-C4, spinal stenosis with moderate spinal cord mass-effect.  Moderate to severe bilateral C4 foraminal stenosis. MRI of the orbits negative.  MRI of the brain did not reveal any tumor or stroke    Objective:   Physical Exam Weight 114 previously weighed 118 pounds October 2019.  Total protein and albumin are normal.  Neck is supple without thyromegaly.  Chest clear to auscultation.  Cardiac exam regular rate and rhythm.  Affect is calm and a bit wistful.       Assessment & Plan:  Fatigue  Anxiety and depression  Multiple myeloma on treatment per Dr. Marin Olp  History of Horner syndrome left with no finding of tumor to explain symptoms  Essential hypertension  Plan: She will be on Lexapro 20 mg daily and Wellbutrin 300 mg daily.  She will try this for several weeks and let me know how she is doing.

## 2018-09-15 ENCOUNTER — Other Ambulatory Visit: Payer: Self-pay | Admitting: *Deleted

## 2018-09-15 DIAGNOSIS — M542 Cervicalgia: Secondary | ICD-10-CM | POA: Diagnosis not present

## 2018-09-15 DIAGNOSIS — G959 Disease of spinal cord, unspecified: Secondary | ICD-10-CM | POA: Diagnosis not present

## 2018-09-15 DIAGNOSIS — C9000 Multiple myeloma not having achieved remission: Secondary | ICD-10-CM

## 2018-09-15 MED ORDER — POMALIDOMIDE 3 MG PO CAPS: 3.0000 mg | ORAL_CAPSULE | Freq: Every day | ORAL | 0 refills | Status: DC

## 2018-09-16 ENCOUNTER — Other Ambulatory Visit: Payer: PPO

## 2018-09-16 ENCOUNTER — Ambulatory Visit: Payer: PPO

## 2018-09-16 ENCOUNTER — Ambulatory Visit: Payer: PPO | Admitting: Hematology & Oncology

## 2018-09-17 ENCOUNTER — Other Ambulatory Visit: Payer: Self-pay | Admitting: Internal Medicine

## 2018-09-19 IMAGING — DX DG BONE SURVEY MET
8 of 10 series · 8 of 10 positions shown · non-contrast
Comparison: None in PACs

CLINICAL DATA: Low back pain and tenderness since falling last
month. The patient reports generalized body aches and pains but no
new injury or trauma to any other area. History of abnormal bone
marrow examination consistent with probable multiple myeloma.

EXAM:
METASTATIC BONE SURVEY

[chest pa]
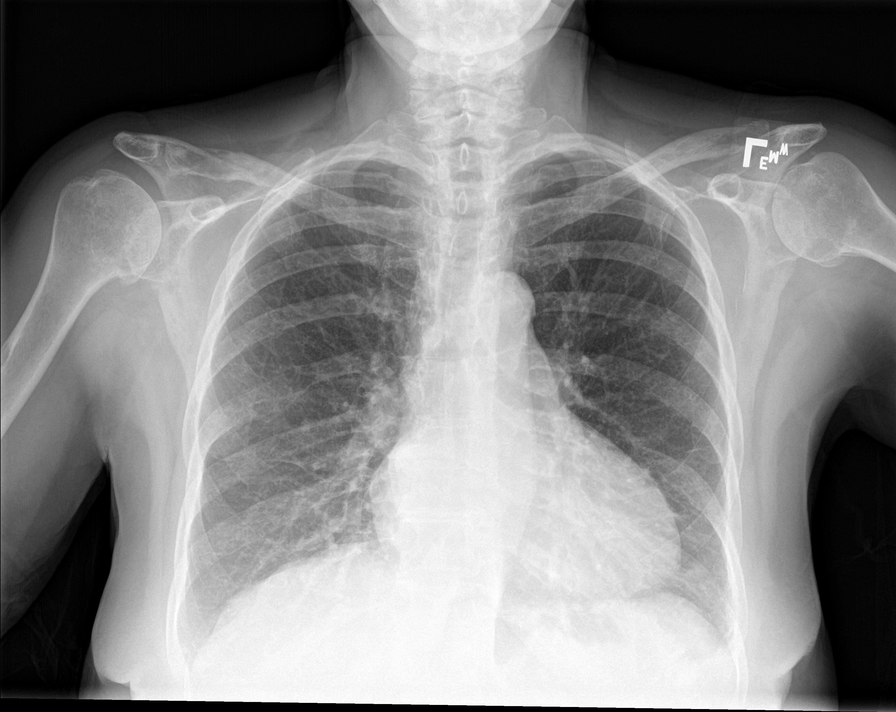

[c-spine lat]
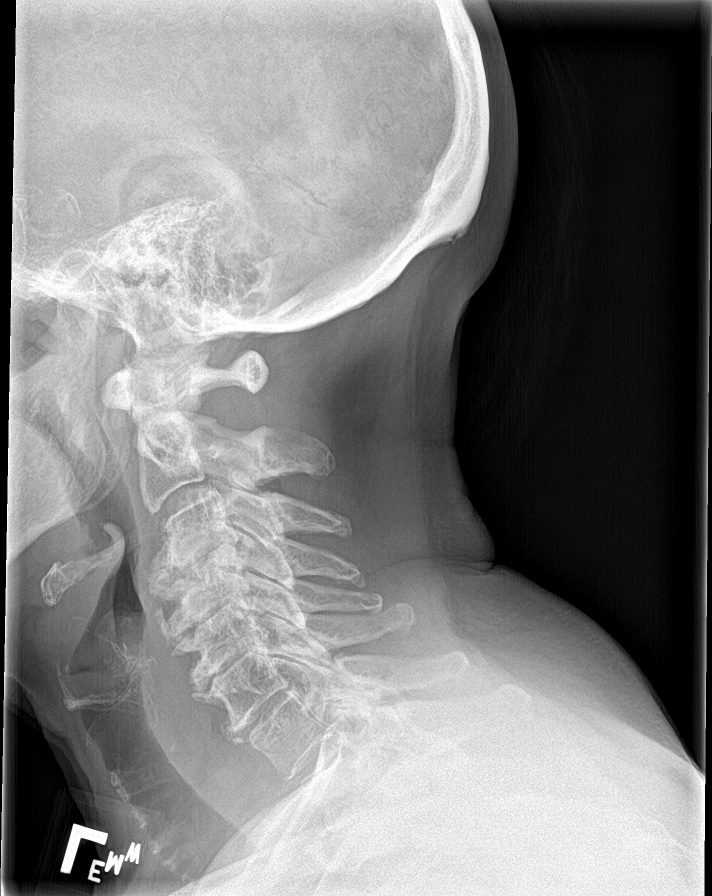

[skull lat]
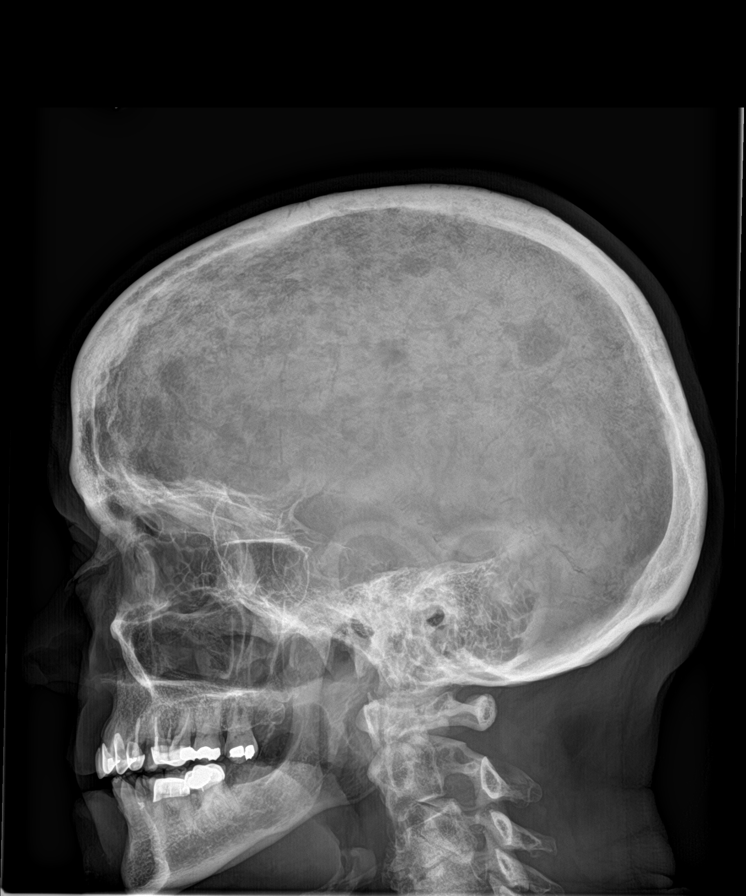

[c-spine ap]
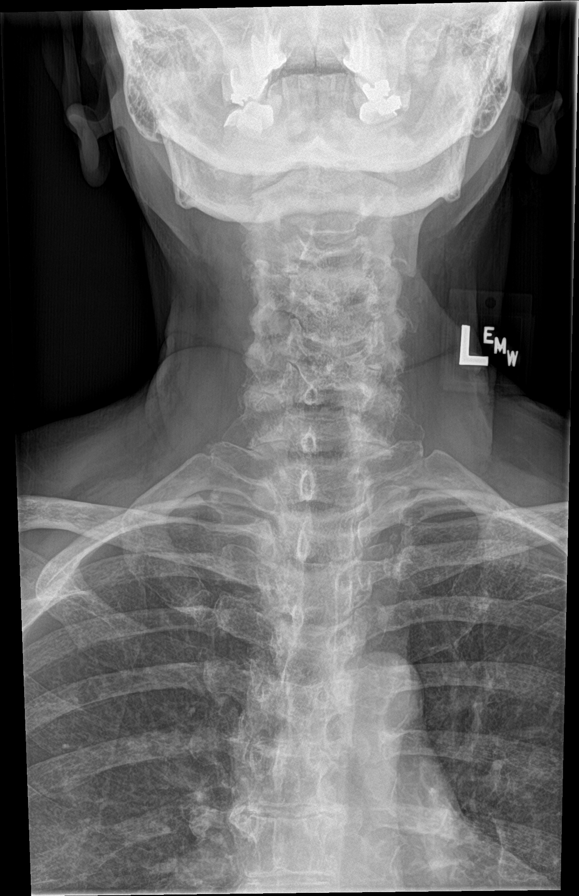

[t-spine ap]
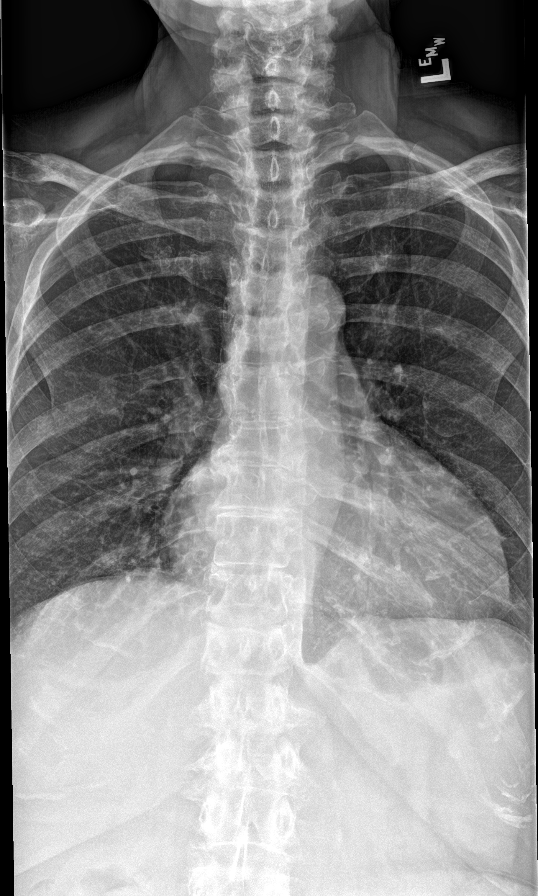

[t-spine lat]
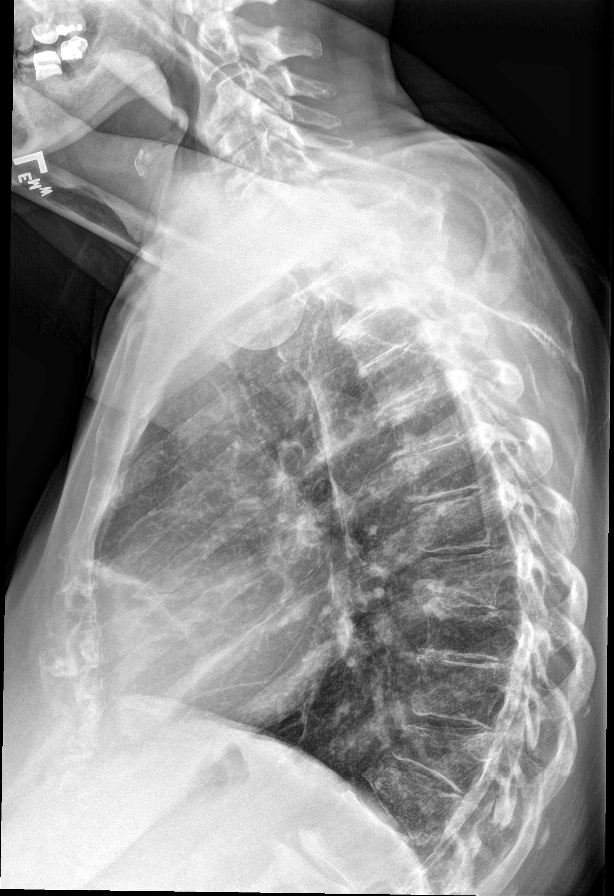

[shoulder ap (1 of 2)]
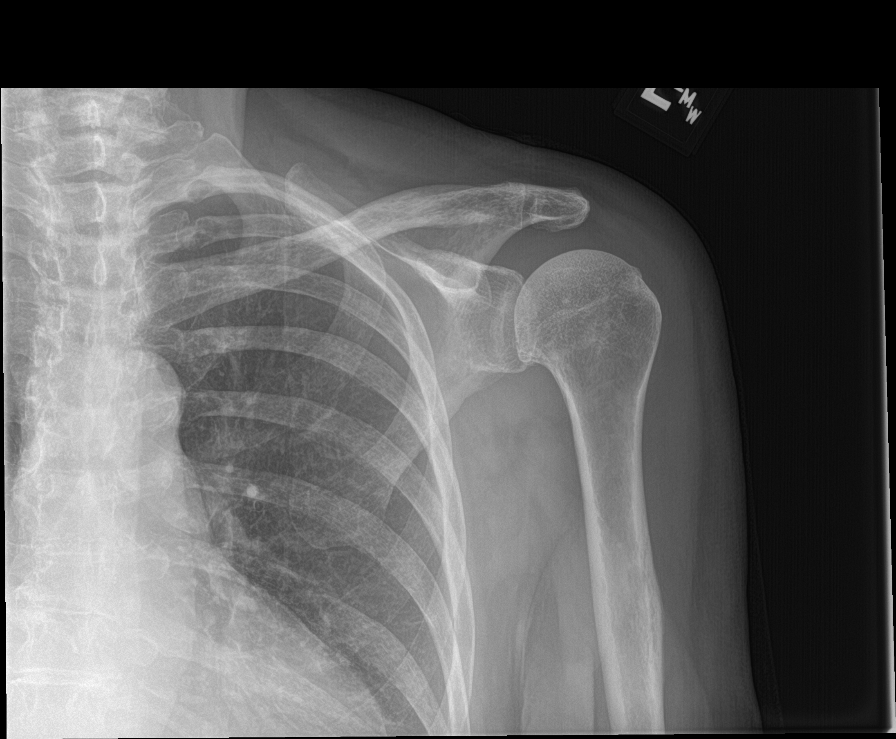

[shoulder ap (2 of 2)]
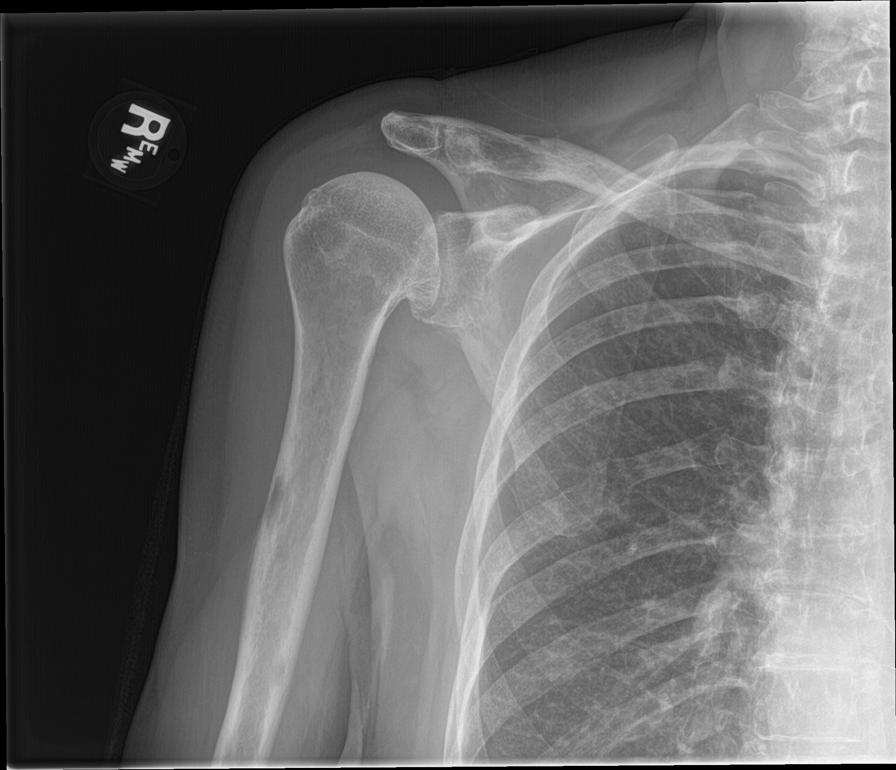

[8 of 10 positions shown; findings below may reference images not displayed]

FINDINGS: Chest x-ray: The lungs are well-expanded. The interstitial markings
are coarse. There is no alveolar infiltrate or pleural effusion. The
heart is top-normal in size. The pulmonary vascularity is not
engorged. There is calcification in the wall of the aortic arch.
There is lucency involving the posterior aspect of the right seventh
rib which is suspicious for a lytic process. There is no pleural
effusion.

Calvarium: There are multiple lytic lesions within the calvarium
measuring up to 1.5 cm in diameter.

Spine: There is wedge compression of T8, T11, and T12 with loss of
height anteriorly of approximately 40-50%. There is degenerative
disc disease at multiple cervical levels as well as within the
thoracic and lumbar spines.

Upper extremities: There are multiple lucencies within the shafts of
the humeri as well as within the proximal right radius.

Pelvis and lower extremities. The bony pelvis exhibits abnormal
lucency in the inferior aspect of the left ischium and lateral
aspect of the inferior pubic ramus on the left. The hip joint spaces
are reasonably well-maintained. The sacrum is grossly intact. There
are lucencies present within both both femoral shafts and the left
fibular shaft.
IMPRESSION: Widespread lytic lesions within the skeleton worrisome for active
myeloma.

Coarse interstitial lung markings without alveolar pneumonia. No
CHF. Thoracic aortic atherosclerosis.

## 2018-09-19 NOTE — Patient Instructions (Signed)
Take Lexapro 20 mg daily and Wellbutrin XL 300 mg daily.  Let me know in 6 weeks how you are doing or sooner if necessary

## 2018-09-23 ENCOUNTER — Encounter: Payer: Self-pay | Admitting: Hematology & Oncology

## 2018-09-23 ENCOUNTER — Inpatient Hospital Stay: Payer: PPO

## 2018-09-23 ENCOUNTER — Other Ambulatory Visit: Payer: Self-pay

## 2018-09-23 ENCOUNTER — Inpatient Hospital Stay: Payer: PPO | Attending: Hematology & Oncology

## 2018-09-23 ENCOUNTER — Inpatient Hospital Stay (HOSPITAL_BASED_OUTPATIENT_CLINIC_OR_DEPARTMENT_OTHER): Payer: PPO | Admitting: Hematology & Oncology

## 2018-09-23 VITALS — BP 136/48 | HR 73 | Temp 97.8°F | Resp 20 | Wt 117.0 lb

## 2018-09-23 DIAGNOSIS — Z5112 Encounter for antineoplastic immunotherapy: Secondary | ICD-10-CM | POA: Insufficient documentation

## 2018-09-23 DIAGNOSIS — C9000 Multiple myeloma not having achieved remission: Secondary | ICD-10-CM

## 2018-09-23 DIAGNOSIS — R001 Bradycardia, unspecified: Secondary | ICD-10-CM | POA: Diagnosis not present

## 2018-09-23 DIAGNOSIS — C9002 Multiple myeloma in relapse: Secondary | ICD-10-CM | POA: Diagnosis not present

## 2018-09-23 LAB — CBC WITH DIFFERENTIAL (CANCER CENTER ONLY)
Abs Immature Granulocytes: 0.02 10*3/uL (ref 0.00–0.07)
Basophils Absolute: 0 10*3/uL (ref 0.0–0.1)
Basophils Relative: 1 %
Eosinophils Absolute: 0.2 10*3/uL (ref 0.0–0.5)
Eosinophils Relative: 7 %
HEMATOCRIT: 35.8 % — AB (ref 36.0–46.0)
Hemoglobin: 11.9 g/dL — ABNORMAL LOW (ref 12.0–15.0)
Immature Granulocytes: 1 %
LYMPHS ABS: 0.8 10*3/uL (ref 0.7–4.0)
Lymphocytes Relative: 24 %
MCH: 32.5 pg (ref 26.0–34.0)
MCHC: 33.2 g/dL (ref 30.0–36.0)
MCV: 97.8 fL (ref 80.0–100.0)
Monocytes Absolute: 0.7 10*3/uL (ref 0.1–1.0)
Monocytes Relative: 21 %
Neutro Abs: 1.5 10*3/uL — ABNORMAL LOW (ref 1.7–7.7)
Neutrophils Relative %: 46 %
Platelet Count: 198 10*3/uL (ref 150–400)
RBC: 3.66 MIL/uL — ABNORMAL LOW (ref 3.87–5.11)
RDW: 12.2 % (ref 11.5–15.5)
WBC Count: 3.2 10*3/uL — ABNORMAL LOW (ref 4.0–10.5)
nRBC: 0 % (ref 0.0–0.2)

## 2018-09-23 LAB — CMP (CANCER CENTER ONLY)
ALBUMIN: 4 g/dL (ref 3.5–5.0)
ALT: 17 U/L (ref 0–44)
AST: 13 U/L — ABNORMAL LOW (ref 15–41)
Alkaline Phosphatase: 56 U/L (ref 38–126)
Anion gap: 8 (ref 5–15)
BILIRUBIN TOTAL: 0.5 mg/dL (ref 0.3–1.2)
BUN: 20 mg/dL (ref 8–23)
CO2: 28 mmol/L (ref 22–32)
Calcium: 9 mg/dL (ref 8.9–10.3)
Chloride: 105 mmol/L (ref 98–111)
Creatinine: 0.7 mg/dL (ref 0.44–1.00)
GFR, Est AFR Am: >60 mL/min (ref >60)
GFR, Estimated: >60 mL/min (ref >60)
GLUCOSE: 104 mg/dL — AB (ref 70–99)
Potassium: 3.9 mmol/L (ref 3.5–5.1)
Sodium: 141 mmol/L (ref 135–145)
Total Protein: 5.9 g/dL — ABNORMAL LOW (ref 6.5–8.1)

## 2018-09-23 LAB — LACTATE DEHYDROGENASE: LDH: 152 U/L (ref 98–192)

## 2018-09-23 MED ORDER — BORTEZOMIB CHEMO SQ INJECTION 3.5 MG (2.5MG/ML)
1.3000 mg/m2 | Freq: Once | INTRAMUSCULAR | Status: AC
  Administered 2018-09-23: 2 mg via SUBCUTANEOUS
  Filled 2018-09-23: qty 0.8

## 2018-09-23 MED ORDER — PROCHLORPERAZINE MALEATE 10 MG PO TABS: 10.0000 mg | ORAL_TABLET | Freq: Once | ORAL | Status: DC

## 2018-09-23 NOTE — Patient Instructions (Signed)
[facility] Discharge Instructions for Patients Receiving Chemotherapy  Today you received the following chemotherapy agents Velcade To help prevent nausea and vomiting after your treatment, we encourage you to take your nausea medication as prescribed.   If you develop nausea and vomiting that is not controlled by your nausea medication, call the clinic.   BELOW ARE SYMPTOMS THAT SHOULD BE REPORTED IMMEDIATELY:  *FEVER GREATER THAN 100.5 F  *CHILLS WITH OR WITHOUT FEVER  NAUSEA AND VOMITING THAT IS NOT CONTROLLED WITH YOUR NAUSEA MEDICATION  *UNUSUAL SHORTNESS OF BREATH  *UNUSUAL BRUISING OR BLEEDING  TENDERNESS IN MOUTH AND THROAT WITH OR WITHOUT PRESENCE OF ULCERS  *URINARY PROBLEMS  *BOWEL PROBLEMS  UNUSUAL RASH Items with * indicate a potential emergency and should be followed up as soon as possible.  Feel free to call the clinic should you have any questions or concerns. The clinic phone number is [phone].  Please show the CHEMO ALERT CARD at check-in to the Emergency Department and triage nurse.   

## 2018-09-23 NOTE — Progress Notes (Signed)
Hematology and Oncology Follow Up Visit  Debra Ruiz [id number] [birth year] 73 y.o. 09/23/2018   Principle Diagnosis:  IgG Kappa myeloma - compression fractures - 13q-, +11, 14q+ - hyperdiploid -- progressive  Completed Therapy: S/p Kyphoplasty of T11 and T12 XRT to lower spine-completed on 09/25/2016  Current Therapy:   Velcade/Pomalyst/Decadron -Start on 09/02/2018 Xgeva 148m Sq q  3 month - next dose in April 2020   Interim History:  Debra Ruiz here today for follow-up.  She did not feel all that well.  Apparently, she was in bed for 3 days.  I will know she had little bit of bronchitis or what.  She was not coughing up anything purulent.  She does feel a bit unsteady.  She does have a low heart rate.  She was started on Bystolic.  I think this might be a little bit too much for her.  I told her to stop the Bystolic and see if this makes her feel a little bit better.  Her last monoclonal spike back in February was 0.2 g/dL.  Her IgG level was 588 mg/dL.  Her kappa light chain was 1.1 mg/dL.  She is on the off week of the pomalidomide.  She is having a little bit of constipation.  She recently saw her orthopedist.  They are worried about potential spinal stenosis in her neck.  It sounds like this might be a surgical problem in the future.  Her appetite is doing okay.  She is had no nausea or vomiting.  She has had no bleeding.  Currently, her performance status is ECOG 1.    Medications:  Allergies as of 09/23/2018      Reactions   Crestor [rosuvastatin Calcium]    Causes liver functions to be elevated      Medication List       Accurate as of September 23, 2018  1:00 PM. Always use your most recent med list.        ALPRAZolam 0.25 MG tablet Commonly known as:  XANAX Take 1 tablet (0.25 mg total) by mouth 2 (two) times daily as needed. for anxiety   amLODipine 5 MG tablet Commonly known as:  NORVASC TAKE 1 TABLET(5 MG) BY MOUTH DAILY   aspirin 325 MG tablet Take  325 mg by mouth daily.   atorvastatin 20 MG tablet Commonly known as:  LIPITOR TAKE 1 TABLET(20 MG) BY MOUTH DAILY   buPROPion 300 MG 24 hr tablet Commonly known as:  WELLBUTRIN XL Take 1 tablet (300 mg total) by mouth daily.   BYSTOLIC 5 MG tablet Generic drug:  nebivolol TAKE 1 TABLET(5 MG) BY MOUTH DAILY   dexamethasone 4 MG tablet Commonly known as:  DECADRON TAKE 3 TABLETS BY MOUTH ONCE A WEEK   escitalopram 20 MG tablet Commonly known as:  LEXAPRO TAKE 1 TABLET(20 MG) BY MOUTH DAILY   famciclovir 250 MG tablet Commonly known as:  FAMVIR Take 1 tablet (250 mg total) by mouth daily.   pomalidomide 3 MG capsule Commonly known as:  POMALYST Take 1 capsule (3 mg total) by mouth daily. Take with water on days 1-21. Repeat every 28 days. [id number]  potassium chloride SA 20 MEQ tablet Commonly known as:  K-DUR,KLOR-CON TAKE 1 TABLET(20 MEQ) BY MOUTH DAILY   prochlorperazine 10 MG tablet Commonly known as:  COMPAZINE TAKE 1 TABLET(10 MG) BY MOUTH EVERY 6 HOURS AS NEEDED FOR NAUSEA OR VOMITING   Vitamin D (Ergocalciferol) 50 MCG (2000 UT) Caps Take  2,000 Units by mouth daily.   Vitamin D 50 MCG (2000 UT) Caps Take 2,000 Units by mouth daily.       Allergies:  Allergies  Allergen Reactions  . Crestor [Rosuvastatin Calcium]     Causes liver functions to be elevated    Past Medical History, Surgical history, Social history, and Family History were reviewed and updated.  Review of Systems: Review of Systems  Constitutional: Negative.   HENT: Negative.   Eyes: Negative.   Respiratory: Negative.   Cardiovascular: Negative.   Gastrointestinal: Negative.   Genitourinary: Negative.   Musculoskeletal: Positive for back pain.  Skin: Negative.   Neurological: Negative.   Endo/Heme/Allergies: Negative.   Psychiatric/Behavioral: Negative.     Physical Exam:  weight is 117 lb (53.1 kg). Her oral temperature is 97.8 F (36.6 C). Her blood pressure is 136/48  (abnormal) and her pulse is 73. Her respiration is 20 and oxygen saturation is 99%.   Wt Readings from Last 3 Encounters:  09/23/18 117 lb (53.1 kg)  09/10/18 114 lb (51.7 kg)  09/01/18 117 lb (53.1 kg)    Physical Exam Vitals signs reviewed.  HENT:     Head: Normocephalic and atraumatic.  Eyes:     Pupils: Pupils are equal, round, and reactive to light.  Neck:     Musculoskeletal: Normal range of motion.  Cardiovascular:     Rate and Rhythm: Normal rate and regular rhythm.     Heart sounds: Normal heart sounds.  Pulmonary:     Effort: Pulmonary effort is normal.     Breath sounds: Normal breath sounds.  Abdominal:     General: Bowel sounds are normal.     Palpations: Abdomen is soft.  Musculoskeletal: Normal range of motion.        General: No tenderness or deformity.  Lymphadenopathy:     Cervical: No cervical adenopathy.  Skin:    General: Skin is warm and dry.     Findings: No erythema or rash.  Neurological:     Mental Status: She is alert and oriented to person, place, and time.  Psychiatric:        Behavior: Behavior normal.        Thought Content: Thought content normal.        Judgment: Judgment normal.       Lab Results  Component Value Date   WBC 3.2 (L) 09/23/2018   HGB 11.9 (L) 09/23/2018   HCT 35.8 (L) 09/23/2018   MCV 97.8 09/23/2018   PLT 198 09/23/2018   Lab Results  Component Value Date   FERRITIN 28 07/23/2018   IRON 138 07/23/2018   TIBC 381 07/23/2018   UIBC 244 07/23/2018   IRONPCTSAT 36 07/23/2018   Lab Results  Component Value Date   RBC 3.66 (L) 09/23/2018   Lab Results  Component Value Date   KPAFRELGTCHN 10.9 09/01/2018   LAMBDASER 2.9 (L) 09/01/2018   KAPLAMBRATIO 3.76 (H) 09/01/2018   Lab Results  Component Value Date   IGGSERUM 588 (L) 09/01/2018   IGGSERUM 611 (L) 09/01/2018   IGA 37 (L) 09/01/2018   IGA 40 (L) 09/01/2018   IGMSERUM 31 09/01/2018   IGMSERUM 34 09/01/2018   Lab Results  Component Value Date    TOTALPROTELP 6.7 09/01/2018   ALBUMINELP 4.0 09/01/2018   A1GS 0.3 09/01/2018   A2GS 0.8 09/01/2018   BETS 1.1 09/01/2018   GAMS 0.5 09/01/2018   MSPIKE 0.2 (H) 09/01/2018   SPEI Comment 01/28/2018  Chemistry      Component Value Date/Time   NA 141 09/23/2018 1039   NA 148 (H) 07/16/2017 1128   NA 143 03/05/2017 1051   K 3.9 09/23/2018 1039   K 3.8 07/16/2017 1128   K 3.7 03/05/2017 1051   CL 105 09/23/2018 1039   CL 106 07/16/2017 1128   CO2 28 09/23/2018 1039   CO2 29 07/16/2017 1128   CO2 25 03/05/2017 1051   BUN 20 09/23/2018 1039   BUN 23 (H) 07/16/2017 1128   BUN 18.2 03/05/2017 1051   CREATININE 0.70 09/23/2018 1039   CREATININE 0.69 05/07/2018 1233   CREATININE 0.8 03/05/2017 1051      Component Value Date/Time   CALCIUM 9.0 09/23/2018 1039   CALCIUM 9.7 07/16/2017 1128   CALCIUM 8.9 03/05/2017 1051   ALKPHOS 56 09/23/2018 1039   ALKPHOS 71 07/16/2017 1128   ALKPHOS 67 03/05/2017 1051   AST 13 (L) 09/23/2018 1039   AST 15 03/05/2017 1051   ALT 17 09/23/2018 1039   ALT 23 07/16/2017 1128   ALT 17 03/05/2017 1051   BILITOT 0.5 09/23/2018 1039   BILITOT 0.55 03/05/2017 1051      Impression and Plan: Ms. Hey is a very pleasant 73 yo caucasian female with IgG Kappa Myeloma.    She in essence has recurrent disease.  We have her back on treatment with Velcade and pomalidomide.  It will be interesting to see what her myeloma studies look like.  Clearly, her quality of life is not as good as I would like it to be.  I think this might be from the beta-blocker that she is on.  She will stop this.  I do not see a problem with her stopping this for a month.  We will plan to get her back to see [place] in 3 weeks.    [name], MD 3/4/20201:00 PM

## 2018-09-24 LAB — KAPPA/LAMBDA LIGHT CHAINS
Kappa free light chain: 7.4 mg/L (ref 3.3–19.4)
Kappa, lambda light chain ratio: 1.1 (ref 0.26–1.65)
Lambda free light chains: 6.7 mg/L (ref 5.7–26.3)

## 2018-09-24 LAB — IGG, IGA, IGM
IGM (IMMUNOGLOBULIN M), SRM: 19 mg/dL — AB (ref 26–217)
IgA: 26 mg/dL — ABNORMAL LOW (ref 64–422)
IgG (Immunoglobin G), Serum: 328 mg/dL — ABNORMAL LOW (ref 700–1600)

## 2018-10-01 ENCOUNTER — Other Ambulatory Visit: Payer: Self-pay | Admitting: *Deleted

## 2018-10-01 DIAGNOSIS — C9000 Multiple myeloma not having achieved remission: Secondary | ICD-10-CM

## 2018-10-01 MED ORDER — POMALIDOMIDE 3 MG PO CAPS: 3.0000 mg | ORAL_CAPSULE | Freq: Every day | ORAL | 0 refills | Status: DC

## 2018-10-06 ENCOUNTER — Telehealth: Payer: Self-pay | Admitting: Internal Medicine

## 2018-10-06 MED ORDER — BUPROPION HCL ER (XL) 300 MG PO TB24: 300.0000 mg | ORAL_TABLET | Freq: Every day | ORAL | 1 refills | Status: DC

## 2018-10-06 NOTE — Telephone Encounter (Signed)
Refill Wellbutrin 300 mg #90 with one refill

## 2018-10-14 ENCOUNTER — Inpatient Hospital Stay: Payer: PPO

## 2018-10-14 ENCOUNTER — Telehealth: Payer: Self-pay | Admitting: Hematology & Oncology

## 2018-10-14 ENCOUNTER — Encounter: Payer: Self-pay | Admitting: Hematology & Oncology

## 2018-10-14 ENCOUNTER — Other Ambulatory Visit: Payer: Self-pay

## 2018-10-14 ENCOUNTER — Inpatient Hospital Stay (HOSPITAL_BASED_OUTPATIENT_CLINIC_OR_DEPARTMENT_OTHER): Payer: PPO | Admitting: Hematology & Oncology

## 2018-10-14 VITALS — BP 132/88 | HR 99 | Temp 98.2°F | Resp 20 | Wt 117.4 lb

## 2018-10-14 DIAGNOSIS — Z5112 Encounter for antineoplastic immunotherapy: Secondary | ICD-10-CM | POA: Diagnosis not present

## 2018-10-14 DIAGNOSIS — C9000 Multiple myeloma not having achieved remission: Secondary | ICD-10-CM | POA: Diagnosis not present

## 2018-10-14 LAB — CBC WITH DIFFERENTIAL (CANCER CENTER ONLY)
Abs Immature Granulocytes: 0.03 10*3/uL (ref 0.00–0.07)
Basophils Absolute: 0.1 10*3/uL (ref 0.0–0.1)
Basophils Relative: 1 %
Eosinophils Absolute: 0.7 10*3/uL — ABNORMAL HIGH (ref 0.0–0.5)
Eosinophils Relative: 16 %
HCT: 37.3 % (ref 36.0–46.0)
Hemoglobin: 11.9 g/dL — ABNORMAL LOW (ref 12.0–15.0)
Immature Granulocytes: 1 %
Lymphocytes Relative: 13 %
Lymphs Abs: 0.6 10*3/uL — ABNORMAL LOW (ref 0.7–4.0)
MCH: 31.7 pg (ref 26.0–34.0)
MCHC: 31.9 g/dL (ref 30.0–36.0)
MCV: 99.5 fL (ref 80.0–100.0)
Monocytes Absolute: 0.7 10*3/uL (ref 0.1–1.0)
Monocytes Relative: 17 %
Neutro Abs: 2.4 10*3/uL (ref 1.7–7.7)
Neutrophils Relative %: 52 %
Platelet Count: 219 10*3/uL (ref 150–400)
RBC: 3.75 MIL/uL — ABNORMAL LOW (ref 3.87–5.11)
RDW: 13.1 % (ref 11.5–15.5)
WBC Count: 4.5 10*3/uL (ref 4.0–10.5)
nRBC: 0 % (ref 0.0–0.2)

## 2018-10-14 LAB — LACTATE DEHYDROGENASE: LDH: 233 U/L — ABNORMAL HIGH (ref 98–192)

## 2018-10-14 LAB — CMP (CANCER CENTER ONLY)
ALT: 18 U/L (ref 0–44)
AST: 13 U/L — ABNORMAL LOW (ref 15–41)
Albumin: 4.1 g/dL (ref 3.5–5.0)
Alkaline Phosphatase: 65 U/L (ref 38–126)
Anion gap: 9 (ref 5–15)
BILIRUBIN TOTAL: 0.5 mg/dL (ref 0.3–1.2)
BUN: 15 mg/dL (ref 8–23)
CO2: 26 mmol/L (ref 22–32)
Calcium: 8.7 mg/dL — ABNORMAL LOW (ref 8.9–10.3)
Chloride: 106 mmol/L (ref 98–111)
Creatinine: 0.72 mg/dL (ref 0.44–1.00)
GFR, Est AFR Am: >60 mL/min (ref >60)
GFR, Estimated: >60 mL/min (ref >60)
Glucose, Bld: 138 mg/dL — ABNORMAL HIGH (ref 70–99)
Potassium: 4.1 mmol/L (ref 3.5–5.1)
Sodium: 141 mmol/L (ref 135–145)
TOTAL PROTEIN: 5.7 g/dL — AB (ref 6.5–8.1)

## 2018-10-14 MED ORDER — BORTEZOMIB CHEMO SQ INJECTION 3.5 MG (2.5MG/ML)
1.3000 mg/m2 | Freq: Once | INTRAMUSCULAR | Status: AC
  Administered 2018-10-14: 2 mg via SUBCUTANEOUS
  Filled 2018-10-14: qty 0.8

## 2018-10-14 MED ORDER — DENOSUMAB 120 MG/1.7ML ~~LOC~~ SOLN
SUBCUTANEOUS | Status: AC
  Filled 2018-10-14: qty 1.7

## 2018-10-14 MED ORDER — DENOSUMAB 120 MG/1.7ML ~~LOC~~ SOLN
120.0000 mg | Freq: Once | SUBCUTANEOUS | Status: AC
  Administered 2018-10-14: 120 mg via SUBCUTANEOUS

## 2018-10-14 MED ORDER — PROCHLORPERAZINE MALEATE 10 MG PO TABS: 10.0000 mg | ORAL_TABLET | Freq: Once | ORAL | Status: DC

## 2018-10-14 NOTE — Progress Notes (Signed)
Hematology and Oncology Follow Up Visit  Debra Ruiz [id number] May 19, 1946 72 y.o. 10/14/2018   Principle Diagnosis:  IgG Kappa myeloma - compression fractures - 13q-, +11, 14q+ - hyperdiploid -- progressive  Completed Therapy: S/p Kyphoplasty of T11 and T12 XRT to lower spine-completed on 09/25/2016  Current Therapy:   Velcade/Pomalyst/Decadron -Start on 09/02/2018 Xgeva 188m Sq q  3 month - next dose in April 2020   Interim History:  Debra Ruiz here today for follow-up.  She did not feel all that well.  She thinks it might be the pomalidomide.  She has been on the second cycle now for about 2 weeks.  She has constipation.  She is on MiraLAX but only takes it once a day.  I told her to take the MiraLAX 2 or 3 times a day.  She apparently went to her plastic surgeon today and had some injections on her face.  This is to try to help with her lips.  She has had no bleeding.  There is been no fever.  She has had no leg swelling.  She has had no headache.  Currently, her performance status is ECOG 1.      Medications:  Allergies as of 10/14/2018      Reactions   Crestor [rosuvastatin Calcium]    Causes liver functions to be elevated      Medication List       Accurate as of October 14, 2018 12:21 PM. Always use your most recent med list.        ALPRAZolam 0.25 MG tablet Commonly known as:  XANAX Take 1 tablet (0.25 mg total) by mouth 2 (two) times daily as needed. for anxiety   amLODipine 5 MG tablet Commonly known as:  NORVASC TAKE 1 TABLET(5 MG) BY MOUTH DAILY   aspirin 325 MG tablet Take 325 mg by mouth daily.   atorvastatin 20 MG tablet Commonly known as:  LIPITOR TAKE 1 TABLET(20 MG) BY MOUTH DAILY   buPROPion 300 MG 24 hr tablet Commonly known as:  Wellbutrin XL Take 1 tablet (300 mg total) by mouth daily.   Bystolic 5 MG tablet Generic drug:  nebivolol TAKE 1 TABLET(5 MG) BY MOUTH DAILY   dexamethasone 4 MG tablet Commonly known as:  DECADRON  TAKE 3 TABLETS BY MOUTH ONCE A WEEK   escitalopram 20 MG tablet Commonly known as:  LEXAPRO TAKE 1 TABLET(20 MG) BY MOUTH DAILY   famciclovir 250 MG tablet Commonly known as:  FAMVIR Take 1 tablet (250 mg total) by mouth daily.   pomalidomide 3 MG capsule Commonly known as:  POMALYST Take 1 capsule (3 mg total) by mouth daily. Take with water on days 1-21. Repeat every 28 days. [id number]  potassium chloride SA 20 MEQ tablet Commonly known as:  K-DUR,KLOR-CON TAKE 1 TABLET(20 MEQ) BY MOUTH DAILY   prochlorperazine 10 MG tablet Commonly known as:  COMPAZINE TAKE 1 TABLET(10 MG) BY MOUTH EVERY 6 HOURS AS NEEDED FOR NAUSEA OR VOMITING   Vitamin D (Ergocalciferol) 50 MCG (2000 UT) Caps Take 2,000 Units by mouth daily.   Vitamin D 50 MCG (2000 UT) Caps Take 2,000 Units by mouth daily.       Allergies:  Allergies  Allergen Reactions  . Crestor [Rosuvastatin Calcium]     Causes liver functions to be elevated    Past Medical History, Surgical history, Social history, and Family History were reviewed and updated.  Review of Systems: Review of Systems  Constitutional: Negative.  HENT: Negative.   Eyes: Negative.   Respiratory: Negative.   Cardiovascular: Negative.   Gastrointestinal: Negative.   Genitourinary: Negative.   Musculoskeletal: Positive for back pain.  Skin: Negative.   Neurological: Negative.   Endo/Heme/Allergies: Negative.   Psychiatric/Behavioral: Negative.     Physical Exam:  weight is 117 lb 6.4 oz (53.3 kg). Her oral temperature is 98.2 F (36.8 C). Her blood pressure is 132/88 and her pulse is 99. Her respiration is 20 and oxygen saturation is 99%.   Wt Readings from Last 3 Encounters:  10/14/18 117 lb 6.4 oz (53.3 kg)  09/23/18 117 lb (53.1 kg)  09/10/18 114 lb (51.7 kg)    Physical Exam Vitals signs reviewed.  HENT:     Head: Normocephalic and atraumatic.  Eyes:     Pupils: Pupils are equal, round, and reactive to light.  Neck:      Musculoskeletal: Normal range of motion.  Cardiovascular:     Rate and Rhythm: Normal rate and regular rhythm.     Heart sounds: Normal heart sounds.  Pulmonary:     Effort: Pulmonary effort is normal.     Breath sounds: Normal breath sounds.  Abdominal:     General: Bowel sounds are normal.     Palpations: Abdomen is soft.  Musculoskeletal: Normal range of motion.        General: No tenderness or deformity.  Lymphadenopathy:     Cervical: No cervical adenopathy.  Skin:    General: Skin is warm and dry.     Findings: No erythema or rash.  Neurological:     Mental Status: She is alert and oriented to person, place, and time.  Psychiatric:        Behavior: Behavior normal.        Thought Content: Thought content normal.        Judgment: Judgment normal.       Lab Results  Component Value Date   WBC 4.5 10/14/2018   HGB 11.9 (L) 10/14/2018   HCT 37.3 10/14/2018   MCV 99.5 10/14/2018   PLT 219 10/14/2018   Lab Results  Component Value Date   FERRITIN 28 07/23/2018   IRON 138 07/23/2018   TIBC 381 07/23/2018   UIBC 244 07/23/2018   IRONPCTSAT 36 07/23/2018   Lab Results  Component Value Date   RBC 3.75 (L) 10/14/2018   Lab Results  Component Value Date   KPAFRELGTCHN 7.4 09/23/2018   LAMBDASER 6.7 09/23/2018   KAPLAMBRATIO 1.10 09/23/2018   Lab Results  Component Value Date   IGGSERUM 328 (L) 09/23/2018   IGA 26 (L) 09/23/2018   IGMSERUM 19 (L) 09/23/2018   Lab Results  Component Value Date   TOTALPROTELP 6.7 09/01/2018   ALBUMINELP 4.0 09/01/2018   A1GS 0.3 09/01/2018   A2GS 0.8 09/01/2018   BETS 1.1 09/01/2018   GAMS 0.5 09/01/2018   MSPIKE 0.2 (H) 09/01/2018   SPEI Comment 01/28/2018     Chemistry      Component Value Date/Time   NA 141 10/14/2018 1132   NA 148 (H) 07/16/2017 1128   NA 143 03/05/2017 1051   K 4.1 10/14/2018 1132   K 3.8 07/16/2017 1128   K 3.7 03/05/2017 1051   CL 106 10/14/2018 1132   CL 106 07/16/2017 1128   CO2 26  10/14/2018 1132   CO2 29 07/16/2017 1128   CO2 25 03/05/2017 1051   BUN 15 10/14/2018 1132   BUN 23 (H) 07/16/2017 1128  BUN 18.2 03/05/2017 1051   CREATININE 0.72 10/14/2018 1132   CREATININE 0.69 05/07/2018 1233   CREATININE 0.8 03/05/2017 1051      Component Value Date/Time   CALCIUM 8.7 (L) 10/14/2018 1132   CALCIUM 9.7 07/16/2017 1128   CALCIUM 8.9 03/05/2017 1051   ALKPHOS 65 10/14/2018 1132   ALKPHOS 71 07/16/2017 1128   ALKPHOS 67 03/05/2017 1051   AST 13 (L) 10/14/2018 1132   AST 15 03/05/2017 1051   ALT 18 10/14/2018 1132   ALT 23 07/16/2017 1128   ALT 17 03/05/2017 1051   BILITOT 0.5 10/14/2018 1132   BILITOT 0.55 03/05/2017 1051      Impression and Plan: Debra Ruiz is a very pleasant 73 yo caucasian female with IgG Kappa Myeloma.    We will see how she is doing.  We sent off her myeloma studies today.  Hopefully, we will find that she is improving.  For right now, we will have her come back in 3 weeks.  If things really look good, then maybe we will go 4 weeks.    [name], MD 3/25/202012:21 PM

## 2018-10-14 NOTE — Patient Instructions (Addendum)
Bortezomib injection What is this medicine? BORTEZOMIB (bor TEZ oh mib) is a medicine that targets proteins in cancer cells and stops the cancer cells from growing. It is used to treat multiple myeloma and mantle-cell lymphoma. This medicine may be used for other purposes; ask your health care provider or pharmacist if you have questions. COMMON BRAND NAME(S): Velcade What should I tell my health care provider before I take this medicine? They need to know if you have any of these conditions: -diabetes -heart disease -irregular heartbeat -liver disease -on hemodialysis -low blood counts, like low white blood cells, platelets, or hemoglobin -peripheral neuropathy -taking medicine for blood pressure -an unusual or allergic reaction to bortezomib, mannitol, boron, other medicines, foods, dyes, or preservatives -pregnant or trying to get pregnant -breast-feeding How should I use this medicine? This medicine is for injection into a vein or for injection under the skin. It is given by a health care professional in a hospital or clinic setting. Talk to your pediatrician regarding the use of this medicine in children. Special care may be needed. Overdosage: If you think you have taken too much of this medicine contact a poison control center or emergency room at once. NOTE: This medicine is only for you. Do not share this medicine with others. What if I miss a dose? It is important not to miss your dose. Call your doctor or health care professional if you are unable to keep an appointment. What may interact with this medicine? This medicine may interact with the following medications: -ketoconazole -rifampin -ritonavir -St. John's Wort This list may not describe all possible interactions. Give your health care provider a list of all the medicines, herbs, non-prescription drugs, or dietary supplements you use. Also tell them if you smoke, drink alcohol, or use illegal drugs. Some items may  interact with your medicine. What should I watch for while using this medicine? You may get drowsy or dizzy. Do not drive, use machinery, or do anything that needs mental alertness until you know how this medicine affects you. Do not stand or sit up quickly, especially if you are an older patient. This reduces the risk of dizzy or fainting spells. In some cases, you may be given additional medicines to help with side effects. Follow all directions for their use. Call your doctor or health care professional for advice if you get a fever, chills or sore throat, or other symptoms of a cold or flu. Do not treat yourself. This drug decreases your body's ability to fight infections. Try to avoid being around people who are sick. This medicine may increase your risk to bruise or bleed. Call your doctor or health care professional if you notice any unusual bleeding. You may need blood work done while you are taking this medicine. In some patients, this medicine may cause a serious brain infection that may cause death. If you have any problems seeing, thinking, speaking, walking, or standing, tell your doctor right away. If you cannot reach your doctor, urgently seek other source of medical care. Check with your doctor or health care professional if you get an attack of severe diarrhea, nausea and vomiting, or if you sweat a lot. The loss of too much body fluid can make it dangerous for you to take this medicine. Do not become pregnant while taking this medicine or for at least 7 months after stopping it. Women should inform their doctor if they wish to become pregnant or think they might be pregnant. Men should not   father a child while taking this medicine and for at least 4 months after stopping it. There is a potential for serious side effects to an unborn child. Talk to your health care professional or pharmacist for more information. Do not breast-feed an infant while taking this medicine or for 2 months after  stopping it. This medicine may interfere with the ability to have a child. You should talk with your doctor or health care professional if you are concerned about your fertility. What side effects may I notice from receiving this medicine? Side effects that you should report to your doctor or health care professional as soon as possible: -allergic reactions like skin rash, itching or hives, swelling of the face, lips, or tongue -breathing problems -changes in hearing -changes in vision -fast, irregular heartbeat -feeling faint or lightheaded, falls -pain, tingling, numbness in the hands or feet -right upper belly pain -seizures -swelling of the ankles, feet, hands -unusual bleeding or bruising -unusually weak or tired -vomiting -yellowing of the eyes or skin Side effects that usually do not require medical attention (report to your doctor or health care professional if they continue or are bothersome): -changes in emotions or moods -constipation -diarrhea -loss of appetite -headache -irritation at site where injected -nausea This list may not describe all possible side effects. Call your doctor for medical advice about side effects. You may report side effects to FDA at 1-800-FDA-1088. Where should I keep my medicine? This drug is given in a hospital or clinic and will not be stored at home. NOTE: This sheet is a summary. It may not cover all possible information. If you have questions about this medicine, talk to your doctor, pharmacist, or health care provider.  2019 Elsevier/Gold Standard (2017-11-17 16:29:31) Denosumab injection What is this medicine? DENOSUMAB (den oh sue mab) slows bone breakdown. Prolia is used to treat osteoporosis in women after menopause and in men, and in people who are taking corticosteroids for 6 months or more. Delton See is used to treat a high calcium level due to cancer and to prevent bone fractures and other bone problems caused by multiple myeloma or  cancer bone metastases. Delton See is also used to treat giant cell tumor of the bone. This medicine may be used for other purposes; ask your health care provider or pharmacist if you have questions. COMMON BRAND NAME(S): Prolia, XGEVA What should I tell my health care provider before I take this medicine? They need to know if you have any of these conditions: -dental disease -having surgery or tooth extraction -infection -kidney disease -low levels of calcium or Vitamin D in the blood -malnutrition -on hemodialysis -skin conditions or sensitivity -thyroid or parathyroid disease -an unusual reaction to denosumab, other medicines, foods, dyes, or preservatives -pregnant or trying to get pregnant -breast-feeding How should I use this medicine? This medicine is for injection under the skin. It is given by a health care professional in a hospital or clinic setting. A special MedGuide will be given to you before each treatment. Be sure to read this information carefully each time. For Prolia, talk to your pediatrician regarding the use of this medicine in children. Special care may be needed. For Delton See, talk to your pediatrician regarding the use of this medicine in children. While this drug may be prescribed for children as young as 13 years for selected conditions, precautions do apply. Overdosage: If you think you have taken too much of this medicine contact a poison control center or emergency room at once.  NOTE: This medicine is only for you. Do not share this medicine with others. What if I miss a dose? It is important not to miss your dose. Call your doctor or health care professional if you are unable to keep an appointment. What may interact with this medicine? Do not take this medicine with any of the following medications: -other medicines containing denosumab This medicine may also interact with the following medications: -medicines that lower your chance of fighting  infection -steroid medicines like prednisone or cortisone This list may not describe all possible interactions. Give your health care provider a list of all the medicines, herbs, non-prescription drugs, or dietary supplements you use. Also tell them if you smoke, drink alcohol, or use illegal drugs. Some items may interact with your medicine. What should I watch for while using this medicine? Visit your doctor or health care professional for regular checks on your progress. Your doctor or health care professional may order blood tests and other tests to see how you are doing. Call your doctor or health care professional for advice if you get a fever, chills or sore throat, or other symptoms of a cold or flu. Do not treat yourself. This drug may decrease your body's ability to fight infection. Try to avoid being around people who are sick. You should make sure you get enough calcium and vitamin D while you are taking this medicine, unless your doctor tells you not to. Discuss the foods you eat and the vitamins you take with your health care professional. See your dentist regularly. Brush and floss your teeth as directed. Before you have any dental work done, tell your dentist you are receiving this medicine. Do not become pregnant while taking this medicine or for 5 months after stopping it. Talk with your doctor or health care professional about your birth control options while taking this medicine. Women should inform their doctor if they wish to become pregnant or think they might be pregnant. There is a potential for serious side effects to an unborn child. Talk to your health care professional or pharmacist for more information. What side effects may I notice from receiving this medicine? Side effects that you should report to your doctor or health care professional as soon as possible: -allergic reactions like skin rash, itching or hives, swelling of the face, lips, or tongue -bone pain -breathing  problems -dizziness -jaw pain, especially after dental work -redness, blistering, peeling of the skin -signs and symptoms of infection like fever or chills; cough; sore throat; pain or trouble passing urine -signs of low calcium like fast heartbeat, muscle cramps or muscle pain; pain, tingling, numbness in the hands or feet; seizures -unusual bleeding or bruising -unusually weak or tired Side effects that usually do not require medical attention (report to your doctor or health care professional if they continue or are bothersome): -constipation -diarrhea -headache -joint pain -loss of appetite -muscle pain -runny nose -tiredness -upset stomach This list may not describe all possible side effects. Call your doctor for medical advice about side effects. You may report side effects to FDA at 1-800-FDA-1088. Where should I keep my medicine? This medicine is only given in a clinic, doctor's office, or other health care setting and will not be stored at home. NOTE: This sheet is a summary. It may not cover all possible information. If you have questions about this medicine, talk to your doctor, pharmacist, or health care provider.  2019 Elsevier/Gold Standard (2017-11-14 16:10:44)

## 2018-10-14 NOTE — Telephone Encounter (Signed)
Appointments scheduled calendar printed per 3/25 los 

## 2018-10-15 ENCOUNTER — Other Ambulatory Visit: Payer: Self-pay | Admitting: Internal Medicine

## 2018-10-15 LAB — KAPPA/LAMBDA LIGHT CHAINS
KAPPA FREE LGHT CHN: 6.4 mg/L (ref 3.3–19.4)
Kappa, lambda light chain ratio: 1.12 (ref 0.26–1.65)
Lambda free light chains: 5.7 mg/L (ref 5.7–26.3)

## 2018-10-15 LAB — IGG, IGA, IGM
IgA: 28 mg/dL — ABNORMAL LOW (ref 64–422)
IgG (Immunoglobin G), Serum: 269 mg/dL — ABNORMAL LOW (ref 700–1600)
IgM (Immunoglobulin M), Srm: 21 mg/dL — ABNORMAL LOW (ref 26–217)

## 2018-10-16 LAB — PROTEIN ELECTROPHORESIS, SERUM, WITH REFLEX
A/G Ratio: 1.6 (ref 0.7–1.7)
ALPHA-1-GLOBULIN: 0.3 g/dL (ref 0.0–0.4)
Albumin ELP: 3.5 g/dL (ref 2.9–4.4)
Alpha-2-Globulin: 0.8 g/dL (ref 0.4–1.0)
BETA GLOBULIN: 0.9 g/dL (ref 0.7–1.3)
Gamma Globulin: 0.2 g/dL — ABNORMAL LOW (ref 0.4–1.8)
Globulin, Total: 2.2 g/dL (ref 2.2–3.9)
M-Spike, %: 0.1 g/dL — ABNORMAL HIGH
SPEP Interpretation: 0
Total Protein ELP: 5.7 g/dL — ABNORMAL LOW (ref 6.0–8.5)

## 2018-10-16 LAB — IMMUNOFIXATION REFLEX, SERUM
IgA: 30 mg/dL — ABNORMAL LOW (ref 64–422)
IgG (Immunoglobin G), Serum: 307 mg/dL — ABNORMAL LOW (ref 700–1600)
IgM (Immunoglobulin M), Srm: 23 mg/dL — ABNORMAL LOW (ref 26–217)

## 2018-10-19 ENCOUNTER — Telehealth: Payer: Self-pay | Admitting: *Deleted

## 2018-10-19 NOTE — Telephone Encounter (Signed)
-----   Message from [name], MD sent at 10/16/2018  4:41 PM EDT ----- Call - the myeloma level is now 0.1!!!  Great job!!  [name]

## 2018-10-19 NOTE — Telephone Encounter (Signed)
Notified pt of myeloma results.  Pt verbalized understanding and requested Pt states my toes are numb more since the last week. Pt denies pain or difficulty with walking.  MD notified of pt concerns, will continue to monitor.

## 2018-11-03 ENCOUNTER — Inpatient Hospital Stay (HOSPITAL_BASED_OUTPATIENT_CLINIC_OR_DEPARTMENT_OTHER): Payer: PPO | Admitting: Hematology & Oncology

## 2018-11-03 ENCOUNTER — Inpatient Hospital Stay: Payer: PPO

## 2018-11-03 ENCOUNTER — Encounter: Payer: Self-pay | Admitting: Hematology & Oncology

## 2018-11-03 ENCOUNTER — Other Ambulatory Visit: Payer: Self-pay

## 2018-11-03 ENCOUNTER — Inpatient Hospital Stay: Payer: PPO | Attending: Hematology & Oncology

## 2018-11-03 VITALS — BP 132/91 | HR 95 | Temp 97.4°F | Resp 19 | Wt 115.0 lb

## 2018-11-03 DIAGNOSIS — Z79899 Other long term (current) drug therapy: Secondary | ICD-10-CM | POA: Diagnosis not present

## 2018-11-03 DIAGNOSIS — C9000 Multiple myeloma not having achieved remission: Secondary | ICD-10-CM

## 2018-11-03 DIAGNOSIS — Z7982 Long term (current) use of aspirin: Secondary | ICD-10-CM | POA: Diagnosis not present

## 2018-11-03 DIAGNOSIS — Z5112 Encounter for antineoplastic immunotherapy: Secondary | ICD-10-CM | POA: Insufficient documentation

## 2018-11-03 LAB — CBC WITH DIFFERENTIAL (CANCER CENTER ONLY)
Abs Immature Granulocytes: 0.06 10*3/uL (ref 0.00–0.07)
Basophils Absolute: 0 10*3/uL (ref 0.0–0.1)
Basophils Relative: 0 %
Eosinophils Absolute: 0 10*3/uL (ref 0.0–0.5)
Eosinophils Relative: 0 %
HCT: 36.3 % (ref 36.0–46.0)
Hemoglobin: 11.8 g/dL — ABNORMAL LOW (ref 12.0–15.0)
Immature Granulocytes: 1 %
Lymphocytes Relative: 9 %
Lymphs Abs: 0.7 10*3/uL (ref 0.7–4.0)
MCH: 31.5 pg (ref 26.0–34.0)
MCHC: 32.5 g/dL (ref 30.0–36.0)
MCV: 96.8 fL (ref 80.0–100.0)
Monocytes Absolute: 0.2 10*3/uL (ref 0.1–1.0)
Monocytes Relative: 2 %
Neutro Abs: 6.6 10*3/uL (ref 1.7–7.7)
Neutrophils Relative %: 88 %
Platelet Count: 289 10*3/uL (ref 150–400)
RBC: 3.75 MIL/uL — ABNORMAL LOW (ref 3.87–5.11)
RDW: 12.6 % (ref 11.5–15.5)
WBC Count: 7.6 10*3/uL (ref 4.0–10.5)
nRBC: 0 % (ref 0.0–0.2)

## 2018-11-03 LAB — CMP (CANCER CENTER ONLY)
ALT: 17 U/L (ref 0–44)
AST: 13 U/L — ABNORMAL LOW (ref 15–41)
Albumin: 4.5 g/dL (ref 3.5–5.0)
Alkaline Phosphatase: 71 U/L (ref 38–126)
Anion gap: 12 (ref 5–15)
BUN: 21 mg/dL (ref 8–23)
CO2: 23 mmol/L (ref 22–32)
Calcium: 8.9 mg/dL (ref 8.9–10.3)
Chloride: 101 mmol/L (ref 98–111)
Creatinine: 0.81 mg/dL (ref 0.44–1.00)
GFR, Est AFR Am: >60 mL/min (ref >60)
GFR, Estimated: >60 mL/min (ref >60)
Glucose, Bld: 239 mg/dL — ABNORMAL HIGH (ref 70–99)
Potassium: 3.8 mmol/L (ref 3.5–5.1)
Sodium: 136 mmol/L (ref 135–145)
Total Bilirubin: 0.4 mg/dL (ref 0.3–1.2)
Total Protein: 6.2 g/dL — ABNORMAL LOW (ref 6.5–8.1)

## 2018-11-03 MED ORDER — PROCHLORPERAZINE MALEATE 10 MG PO TABS: 10.0000 mg | ORAL_TABLET | Freq: Once | ORAL | Status: DC

## 2018-11-03 MED ORDER — BORTEZOMIB CHEMO SQ INJECTION 3.5 MG (2.5MG/ML)
1.3000 mg/m2 | Freq: Once | INTRAMUSCULAR | Status: AC
  Administered 2018-11-03: 2 mg via SUBCUTANEOUS
  Filled 2018-11-03: qty 0.8

## 2018-11-03 MED ORDER — DENOSUMAB 120 MG/1.7ML ~~LOC~~ SOLN: 120.0000 mg | Freq: Once | SUBCUTANEOUS | Status: DC

## 2018-11-03 NOTE — Patient Instructions (Addendum)

## 2018-11-03 NOTE — Progress Notes (Signed)
Hematology and Oncology Follow Up Visit  Debra Ruiz [id number] May 12, 1946 72 y.o. 11/03/2018   Principle Diagnosis:  IgG Kappa myeloma - compression fractures - 13q-, +11, 14q+ - hyperdiploid -- progressive  Completed Therapy: S/p Kyphoplasty of T11 and T12 XRT to lower spine-completed on 09/25/2016  Current Therapy:   Velcade/Pomalyst/Decadron -Start on 09/02/2018 Xgeva 139m Sq q  3 month - next dose in July 2020   Interim History:  Debra Ruiz here today for follow-up.  She still having problems with the pomalidomide.  As such, we will have her dose of pomalidomide for 14 days on and 14 days off.  Hopefully this will make her feel a little bit better.  Her myeloma studies are improving.  She is had a decrease in her M spike.  The M spike is now 0.1 g/dL.  Her IgG level is 307 mg/dL.  Her kappa light chain is 0.6 mg/dL.  She has had no fever.  She has had no rashes.  She is not sleeping all that well.  There is been no leg swelling.  She has had no mouth sores.  She does have some chronic back issues.  Overall, her performance status is ECOG 1.    Medications:  Allergies as of 11/03/2018      Reactions   Crestor [rosuvastatin Calcium]    Causes liver functions to be elevated      Medication List       Accurate as of November 03, 2018 11:40 AM. Always use your most recent med list.        ALPRAZolam 0.25 MG tablet Commonly known as:  XANAX Take 1 tablet (0.25 mg total) by mouth 2 (two) times daily as needed. for anxiety   amLODipine 5 MG tablet Commonly known as:  NORVASC TAKE 1 TABLET(5 MG) BY MOUTH DAILY   aspirin 325 MG tablet Take 325 mg by mouth daily.   atorvastatin 20 MG tablet Commonly known as:  LIPITOR TAKE 1 TABLET(20 MG) BY MOUTH DAILY   buPROPion 300 MG 24 hr tablet Commonly known as:  Wellbutrin XL Take 1 tablet (300 mg total) by mouth daily.   Bystolic 5 MG tablet Generic drug:  nebivolol TAKE 1 TABLET(5 MG) BY MOUTH DAILY    dexamethasone 4 MG tablet Commonly known as:  DECADRON TAKE 3 TABLETS BY MOUTH ONCE A WEEK   escitalopram 20 MG tablet Commonly known as:  LEXAPRO TAKE 1 TABLET(20 MG) BY MOUTH DAILY   famciclovir 250 MG tablet Commonly known as:  FAMVIR Take 1 tablet (250 mg total) by mouth daily.   pomalidomide 3 MG capsule Commonly known as:  POMALYST Take 1 capsule (3 mg total) by mouth daily. Take with water on days 1-21. Repeat every 28 days. [id number]  potassium chloride SA 20 MEQ tablet Commonly known as:  K-DUR,KLOR-CON TAKE 1 TABLET(20 MEQ) BY MOUTH DAILY   prochlorperazine 10 MG tablet Commonly known as:  COMPAZINE TAKE 1 TABLET(10 MG) BY MOUTH EVERY 6 HOURS AS NEEDED FOR NAUSEA OR VOMITING   Vitamin D (Ergocalciferol) 50 MCG (2000 UT) Caps Take 2,000 Units by mouth daily.   Vitamin D 50 MCG (2000 UT) Caps Take 2,000 Units by mouth daily.       Allergies:  Allergies  Allergen Reactions  . Crestor [Rosuvastatin Calcium]     Causes liver functions to be elevated    Past Medical History, Surgical history, Social history, and Family History were reviewed and updated.  Review of Systems:  Review of Systems  Constitutional: Negative.   HENT: Negative.   Eyes: Negative.   Respiratory: Negative.   Cardiovascular: Negative.   Gastrointestinal: Negative.   Genitourinary: Negative.   Musculoskeletal: Positive for back pain.  Skin: Negative.   Neurological: Negative.   Endo/Heme/Allergies: Negative.   Psychiatric/Behavioral: Negative.     Physical Exam:  weight is 115 lb (52.2 kg). Her oral temperature is 97.4 F (36.3 C) (abnormal). Her blood pressure is 132/91 (abnormal) and her pulse is 95. Her respiration is 19 and oxygen saturation is 100%.   Wt Readings from Last 3 Encounters:  11/03/18 115 lb (52.2 kg)  10/14/18 117 lb 6.4 oz (53.3 kg)  09/23/18 117 lb (53.1 kg)    Physical Exam Vitals signs reviewed.  HENT:     Head: Normocephalic and atraumatic.   Eyes:     Pupils: Pupils are equal, round, and reactive to light.  Neck:     Musculoskeletal: Normal range of motion.  Cardiovascular:     Rate and Rhythm: Normal rate and regular rhythm.     Heart sounds: Normal heart sounds.  Pulmonary:     Effort: Pulmonary effort is normal.     Breath sounds: Normal breath sounds.  Abdominal:     General: Bowel sounds are normal.     Palpations: Abdomen is soft.  Musculoskeletal: Normal range of motion.        General: No tenderness or deformity.  Lymphadenopathy:     Cervical: No cervical adenopathy.  Skin:    General: Skin is warm and dry.     Findings: No erythema or rash.  Neurological:     Mental Status: She is alert and oriented to person, place, and time.  Psychiatric:        Behavior: Behavior normal.        Thought Content: Thought content normal.        Judgment: Judgment normal.       Lab Results  Component Value Date   WBC 7.6 11/03/2018   HGB 11.8 (L) 11/03/2018   HCT 36.3 11/03/2018   MCV 96.8 11/03/2018   PLT 289 11/03/2018   Lab Results  Component Value Date   FERRITIN 28 07/23/2018   IRON 138 07/23/2018   TIBC 381 07/23/2018   UIBC 244 07/23/2018   IRONPCTSAT 36 07/23/2018   Lab Results  Component Value Date   RBC 3.75 (L) 11/03/2018   Lab Results  Component Value Date   KPAFRELGTCHN 6.4 10/14/2018   LAMBDASER 5.7 10/14/2018   KAPLAMBRATIO 1.12 10/14/2018   Lab Results  Component Value Date   IGGSERUM 307 (L) 10/14/2018   IGA 30 (L) 10/14/2018   IGMSERUM 23 (L) 10/14/2018   Lab Results  Component Value Date   TOTALPROTELP 5.7 (L) 10/14/2018   ALBUMINELP 3.5 10/14/2018   A1GS 0.3 10/14/2018   A2GS 0.8 10/14/2018   BETS 0.9 10/14/2018   GAMS 0.2 (L) 10/14/2018   MSPIKE 0.1 (H) 10/14/2018   SPEI Comment 01/28/2018     Chemistry      Component Value Date/Time   NA 136 11/03/2018 0945   NA 148 (H) 07/16/2017 1128   NA 143 03/05/2017 1051   K 3.8 11/03/2018 0945   K 3.8 07/16/2017 1128    K 3.7 03/05/2017 1051   CL 101 11/03/2018 0945   CL 106 07/16/2017 1128   CO2 23 11/03/2018 0945   CO2 29 07/16/2017 1128   CO2 25 03/05/2017 1051   BUN 21 11/03/2018  0945   BUN 23 (H) 07/16/2017 1128   BUN 18.2 03/05/2017 1051   CREATININE 0.81 11/03/2018 0945   CREATININE 0.69 05/07/2018 1233   CREATININE 0.8 03/05/2017 1051      Component Value Date/Time   CALCIUM 8.9 11/03/2018 0945   CALCIUM 9.7 07/16/2017 1128   CALCIUM 8.9 03/05/2017 1051   ALKPHOS 71 11/03/2018 0945   ALKPHOS 71 07/16/2017 1128   ALKPHOS 67 03/05/2017 1051   AST 13 (L) 11/03/2018 0945   AST 15 03/05/2017 1051   ALT 17 11/03/2018 0945   ALT 23 07/16/2017 1128   ALT 17 03/05/2017 1051   BILITOT 0.4 11/03/2018 0945   BILITOT 0.55 03/05/2017 1051      Impression and Plan: Ms. Cove is a very pleasant 73 yo caucasian female with IgG Kappa Myeloma.    Hopefully, the adjustment in her pomalidomide will make her feel a little bit better.  Again, hopefully it will also allow her to have her myeloma under very good control.  Her therapy goal is clearly quality of life.  We will get her back in 1 month.  [name], MD 4/14/202011:40 AM

## 2018-11-04 ENCOUNTER — Other Ambulatory Visit: Payer: Self-pay | Admitting: *Deleted

## 2018-11-04 DIAGNOSIS — C9000 Multiple myeloma not having achieved remission: Secondary | ICD-10-CM

## 2018-11-04 LAB — KAPPA/LAMBDA LIGHT CHAINS
Kappa free light chain: 4.5 mg/L (ref 3.3–19.4)
Kappa, lambda light chain ratio: 1.5 (ref 0.26–1.65)
Lambda free light chains: 3 mg/L — ABNORMAL LOW (ref 5.7–26.3)

## 2018-11-04 LAB — IGG, IGA, IGM
IgA: 27 mg/dL — ABNORMAL LOW (ref 64–422)
IgG (Immunoglobin G), Serum: 269 mg/dL — ABNORMAL LOW (ref 586–1602)
IgM (Immunoglobulin M), Srm: 19 mg/dL — ABNORMAL LOW (ref 26–217)

## 2018-11-04 MED ORDER — POMALIDOMIDE 3 MG PO CAPS: ORAL_CAPSULE | ORAL | 0 refills | Status: DC

## 2018-11-05 LAB — PROTEIN ELECTROPHORESIS, SERUM, WITH REFLEX
A/G Ratio: 1.5 (ref 0.7–1.7)
Albumin ELP: 3.6 g/dL (ref 2.9–4.4)
Alpha-1-Globulin: 0.3 g/dL (ref 0.0–0.4)
Alpha-2-Globulin: 0.8 g/dL (ref 0.4–1.0)
Beta Globulin: 1.1 g/dL (ref 0.7–1.3)
Gamma Globulin: 0.3 g/dL — ABNORMAL LOW (ref 0.4–1.8)
Globulin, Total: 2.4 g/dL (ref 2.2–3.9)
M-Spike, %: 0.1 g/dL — ABNORMAL HIGH
SPEP Interpretation: 0
Total Protein ELP: 6 g/dL (ref 6.0–8.5)

## 2018-11-05 LAB — IMMUNOFIXATION REFLEX, SERUM
IgA: 29 mg/dL — ABNORMAL LOW (ref 64–422)
IgG (Immunoglobin G), Serum: 258 mg/dL — ABNORMAL LOW (ref 586–1602)
IgM (Immunoglobulin M), Srm: 20 mg/dL — ABNORMAL LOW (ref 26–217)

## 2018-11-12 ENCOUNTER — Other Ambulatory Visit: Payer: Self-pay | Admitting: Hematology & Oncology

## 2018-11-12 DIAGNOSIS — C9000 Multiple myeloma not having achieved remission: Secondary | ICD-10-CM

## 2018-11-24 ENCOUNTER — Telehealth: Payer: Self-pay | Admitting: Internal Medicine

## 2018-11-24 ENCOUNTER — Other Ambulatory Visit: Payer: Self-pay | Admitting: *Deleted

## 2018-11-24 DIAGNOSIS — C9000 Multiple myeloma not having achieved remission: Secondary | ICD-10-CM

## 2018-11-24 MED ORDER — POMALIDOMIDE 3 MG PO CAPS: ORAL_CAPSULE | ORAL | 0 refills | Status: DC

## 2018-11-24 NOTE — Telephone Encounter (Signed)
LVM to CB and schedule CPE and Labs for after 05/13/19

## 2018-11-25 ENCOUNTER — Ambulatory Visit: Payer: PPO | Admitting: Hematology & Oncology

## 2018-11-25 ENCOUNTER — Ambulatory Visit: Payer: PPO

## 2018-11-25 ENCOUNTER — Other Ambulatory Visit: Payer: PPO

## 2018-12-01 NOTE — Telephone Encounter (Signed)
Pt CB and schedule CPE

## 2018-12-02 ENCOUNTER — Inpatient Hospital Stay: Payer: PPO

## 2018-12-02 ENCOUNTER — Inpatient Hospital Stay: Payer: PPO | Attending: Hematology & Oncology | Admitting: Hematology & Oncology

## 2018-12-02 ENCOUNTER — Other Ambulatory Visit: Payer: Self-pay

## 2018-12-02 ENCOUNTER — Encounter: Payer: Self-pay | Admitting: Hematology & Oncology

## 2018-12-02 VITALS — BP 134/57 | HR 86 | Temp 97.6°F | Resp 20 | Wt 115.8 lb

## 2018-12-02 DIAGNOSIS — D509 Iron deficiency anemia, unspecified: Secondary | ICD-10-CM | POA: Insufficient documentation

## 2018-12-02 DIAGNOSIS — C9000 Multiple myeloma not having achieved remission: Secondary | ICD-10-CM | POA: Diagnosis not present

## 2018-12-02 DIAGNOSIS — R5383 Other fatigue: Secondary | ICD-10-CM

## 2018-12-02 DIAGNOSIS — M549 Dorsalgia, unspecified: Secondary | ICD-10-CM | POA: Diagnosis not present

## 2018-12-02 DIAGNOSIS — Z5112 Encounter for antineoplastic immunotherapy: Secondary | ICD-10-CM | POA: Diagnosis not present

## 2018-12-02 DIAGNOSIS — K909 Intestinal malabsorption, unspecified: Secondary | ICD-10-CM

## 2018-12-02 DIAGNOSIS — Z79899 Other long term (current) drug therapy: Secondary | ICD-10-CM | POA: Insufficient documentation

## 2018-12-02 DIAGNOSIS — D500 Iron deficiency anemia secondary to blood loss (chronic): Secondary | ICD-10-CM

## 2018-12-02 LAB — CMP (CANCER CENTER ONLY)
ALT: 17 U/L (ref 0–44)
AST: 13 U/L — ABNORMAL LOW (ref 15–41)
Albumin: 4.3 g/dL (ref 3.5–5.0)
Alkaline Phosphatase: 69 U/L (ref 38–126)
Anion gap: 10 (ref 5–15)
BUN: 17 mg/dL (ref 8–23)
CO2: 27 mmol/L (ref 22–32)
Calcium: 9.5 mg/dL (ref 8.9–10.3)
Chloride: 103 mmol/L (ref 98–111)
Creatinine: 0.76 mg/dL (ref 0.44–1.00)
GFR, Est AFR Am: >60 mL/min (ref >60)
GFR, Estimated: >60 mL/min (ref >60)
Glucose, Bld: 130 mg/dL — ABNORMAL HIGH (ref 70–99)
Potassium: 3.4 mmol/L — ABNORMAL LOW (ref 3.5–5.1)
Sodium: 140 mmol/L (ref 135–145)
Total Bilirubin: 0.4 mg/dL (ref 0.3–1.2)
Total Protein: 5.9 g/dL — ABNORMAL LOW (ref 6.5–8.1)

## 2018-12-02 LAB — CBC WITH DIFFERENTIAL (CANCER CENTER ONLY)
Abs Immature Granulocytes: 0.27 10*3/uL — ABNORMAL HIGH (ref 0.00–0.07)
Basophils Absolute: 0 10*3/uL (ref 0.0–0.1)
Basophils Relative: 0 %
Eosinophils Absolute: 0.2 10*3/uL (ref 0.0–0.5)
Eosinophils Relative: 2 %
HCT: 32.7 % — ABNORMAL LOW (ref 36.0–46.0)
Hemoglobin: 10.1 g/dL — ABNORMAL LOW (ref 12.0–15.0)
Immature Granulocytes: 3 %
Lymphocytes Relative: 10 %
Lymphs Abs: 0.9 10*3/uL (ref 0.7–4.0)
MCH: 29.1 pg (ref 26.0–34.0)
MCHC: 30.9 g/dL (ref 30.0–36.0)
MCV: 94.2 fL (ref 80.0–100.0)
Monocytes Absolute: 0.8 10*3/uL (ref 0.1–1.0)
Monocytes Relative: 9 %
Neutro Abs: 7.1 10*3/uL (ref 1.7–7.7)
Neutrophils Relative %: 76 %
Platelet Count: 326 10*3/uL (ref 150–400)
RBC: 3.47 MIL/uL — ABNORMAL LOW (ref 3.87–5.11)
RDW: 13.6 % (ref 11.5–15.5)
WBC Count: 9.3 10*3/uL (ref 4.0–10.5)
nRBC: 0 % (ref 0.0–0.2)

## 2018-12-02 MED ORDER — BORTEZOMIB CHEMO SQ INJECTION 3.5 MG (2.5MG/ML)
1.3000 mg/m2 | Freq: Once | INTRAMUSCULAR | Status: AC
  Administered 2018-12-02: 2 mg via SUBCUTANEOUS
  Filled 2018-12-02: qty 0.8

## 2018-12-02 MED ORDER — PROCHLORPERAZINE MALEATE 10 MG PO TABS: 10.0000 mg | ORAL_TABLET | Freq: Once | ORAL | Status: DC

## 2018-12-02 NOTE — Progress Notes (Addendum)
Hematology and Oncology Follow Up Visit  Debra Ruiz [id number] 03-10-1946 73 y.o. 12/02/2018   Principle Diagnosis:  IgG Kappa myeloma - compression fractures - 13q-, +11, 14q+ - hyperdiploid -- progressive Iron def anemia  Completed Therapy: S/p Kyphoplasty of T11 and T12 XRT to lower spine-completed on 09/25/2016  Current Therapy:   Velcade/Pomalyst/Decadron -Start on 09/02/2018 Xgeva 155m Sq q  3 month - next dose in July 2020 IV Injectafer   Interim History:  [name] here today for follow-up.  She is not feeling all that well.  She feels it might be from the pomalidomide.  Right now.  Quality of life feel better.  Her myeloma studies have done quite well.  M spike is 0.1 g/dL.  IgG level is 269 mg/dL.  Her kappa light chain is 0.5 mg/dL.  Her appetite is down a little bit.  There is no nausea or vomiting.  She has had no diarrhea.  She is had no cough.  There is been no headache.  Has had no mouth sores.  She does feel tired.  She does have quite a bit of.  We are checking her iron studies.  It would not surprise me if her iron is low.  Overall, her performance status is ECOG 1.    Medications:  Allergies as of 12/02/2018      Reactions   Crestor [rosuvastatin Calcium]    Causes liver functions to be elevated      Medication List       Accurate as of Dec 02, 2018 11:47 AM. If you have any questions, ask your nurse or doctor.        ALPRAZolam 0.25 MG tablet Commonly known as:  XANAX Take 1 tablet (0.25 mg total) by mouth 2 (two) times daily as needed. for anxiety   amLODipine 5 MG tablet Commonly known as:  NORVASC TAKE 1 TABLET(5 MG) BY MOUTH DAILY   aspirin 325 MG tablet Take 325 mg by mouth daily.   atorvastatin 20 MG tablet Commonly known as:  LIPITOR TAKE 1 TABLET(20 MG) BY MOUTH DAILY   buPROPion 300 MG 24 hr tablet Commonly known as:  Wellbutrin XL Take 1 tablet (300 mg total) by mouth daily.   Bystolic 5 MG tablet Generic drug:   nebivolol TAKE 1 TABLET(5 MG) BY MOUTH DAILY   dexamethasone 4 MG tablet Commonly known as:  DECADRON TAKE 3 TABLETS BY MOUTH ONCE A WEEK   escitalopram 20 MG tablet Commonly known as:  LEXAPRO TAKE 1 TABLET(20 MG) BY MOUTH DAILY   famciclovir 250 MG tablet Commonly known as:  FAMVIR TAKE 1 TABLET(250 MG) BY MOUTH DAILY   pomalidomide 3 MG capsule Commonly known as:  POMALYST Take with water on days 1-14 and off days 15-28..Marland KitchenRepeat every 28 days. [id number]  potassium chloride SA 20 MEQ tablet Commonly known as:  K-DUR TAKE 1 TABLET(20 MEQ) BY MOUTH DAILY   prochlorperazine 10 MG tablet Commonly known as:  COMPAZINE TAKE 1 TABLET(10 MG) BY MOUTH EVERY 6 HOURS AS NEEDED FOR NAUSEA OR VOMITING   Vitamin D (Ergocalciferol) 50 MCG (2000 UT) Caps Take 2,000 Units by mouth daily.   Vitamin D 50 MCG (2000 UT) Caps Take 2,000 Units by mouth daily.       Allergies:  Allergies  Allergen Reactions  . Crestor [Rosuvastatin Calcium]     Causes liver functions to be elevated    Past Medical History, Surgical history, Social history, and Family History were  reviewed and updated.  Review of Systems: Review of Systems  Constitutional: Negative.   HENT: Negative.   Eyes: Negative.   Respiratory: Negative.   Cardiovascular: Negative.   Gastrointestinal: Negative.   Genitourinary: Negative.   Musculoskeletal: Positive for back pain.  Skin: Negative.   Neurological: Negative.   Endo/Heme/Allergies: Negative.   Psychiatric/Behavioral: Negative.     Physical Exam:  weight is 115 lb 12.8 oz (52.5 kg). Her oral temperature is 97.6 F (36.4 C). Her blood pressure is 134/57 (abnormal) and her pulse is 86. Her respiration is 20 and oxygen saturation is 100%.   Wt Readings from Last 3 Encounters:  12/02/18 115 lb 12.8 oz (52.5 kg)  11/03/18 115 lb (52.2 kg)  10/14/18 117 lb 6.4 oz (53.3 kg)    Physical Exam Vitals signs reviewed.  HENT:     Head: Normocephalic and  atraumatic.  Eyes:     Pupils: Pupils are equal, round, and reactive to light.  Neck:     Musculoskeletal: Normal range of motion.  Cardiovascular:     Rate and Rhythm: Normal rate and regular rhythm.     Heart sounds: Normal heart sounds.  Pulmonary:     Effort: Pulmonary effort is normal.     Breath sounds: Normal breath sounds.  Abdominal:     General: Bowel sounds are normal.     Palpations: Abdomen is soft.  Musculoskeletal: Normal range of motion.        General: No tenderness or deformity.  Lymphadenopathy:     Cervical: No cervical adenopathy.  Skin:    General: Skin is warm and dry.     Findings: No erythema or rash.  Neurological:     Mental Status: She is alert and oriented to person, place, and time.  Psychiatric:        Behavior: Behavior normal.        Thought Content: Thought content normal.        Judgment: Judgment normal.       Lab Results  Component Value Date   WBC 9.3 12/02/2018   HGB 10.1 (L) 12/02/2018   HCT 32.7 (L) 12/02/2018   MCV 94.2 12/02/2018   PLT 326 12/02/2018   Lab Results  Component Value Date   FERRITIN 28 07/23/2018   IRON 138 07/23/2018   TIBC 381 07/23/2018   UIBC 244 07/23/2018   IRONPCTSAT 36 07/23/2018   Lab Results  Component Value Date   RBC 3.47 (L) 12/02/2018   Lab Results  Component Value Date   KPAFRELGTCHN 4.5 11/03/2018   LAMBDASER 3.0 (L) 11/03/2018   KAPLAMBRATIO 1.50 11/03/2018   Lab Results  Component Value Date   IGGSERUM 269 (L) 11/03/2018   IGGSERUM 258 (L) 11/03/2018   IGA 27 (L) 11/03/2018   IGA 29 (L) 11/03/2018   IGMSERUM 19 (L) 11/03/2018   IGMSERUM 20 (L) 11/03/2018   Lab Results  Component Value Date   TOTALPROTELP 6.0 11/03/2018   ALBUMINELP 3.6 11/03/2018   A1GS 0.3 11/03/2018   A2GS 0.8 11/03/2018   BETS 1.1 11/03/2018   GAMS 0.3 (L) 11/03/2018   MSPIKE 0.1 (H) 11/03/2018   SPEI Comment 01/28/2018     Chemistry      Component Value Date/Time   NA 140 12/02/2018 1023   NA  148 (H) 07/16/2017 1128   NA 143 03/05/2017 1051   K 3.4 (L) 12/02/2018 1023   K 3.8 07/16/2017 1128   K 3.7 03/05/2017 1051   CL 103 12/02/2018  1023   CL 106 07/16/2017 1128   CO2 27 12/02/2018 1023   CO2 29 07/16/2017 1128   CO2 25 03/05/2017 1051   BUN 17 12/02/2018 1023   BUN 23 (H) 07/16/2017 1128   BUN 18.2 03/05/2017 1051   CREATININE 0.76 12/02/2018 1023   CREATININE 0.69 05/07/2018 1233   CREATININE 0.8 03/05/2017 1051      Component Value Date/Time   CALCIUM 9.5 12/02/2018 1023   CALCIUM 9.7 07/16/2017 1128   CALCIUM 8.9 03/05/2017 1051   ALKPHOS 69 12/02/2018 1023   ALKPHOS 71 07/16/2017 1128   ALKPHOS 67 03/05/2017 1051   AST 13 (L) 12/02/2018 1023   AST 15 03/05/2017 1051   ALT 17 12/02/2018 1023   ALT 23 07/16/2017 1128   ALT 17 03/05/2017 1051   BILITOT 0.4 12/02/2018 1023   BILITOT 0.55 03/05/2017 1051         Ferritin is 6. % Iron Saturation is 11%.  Impression and Plan: Ms. Rovira is a very pleasant 73 yo caucasian female with IgG Kappa Myeloma.    I will see what her iron levels show.  Her hemoglobin is down a little bit which is unusual for her.  Hopefully, the adjustment in her pomalidomide will make her feel a little bit better.  Again, hopefully it will also allow her to have her myeloma under very good control.  Given that her iron is still low, we will go ahead and give her some IV iron.  I think she needs 2 doses.  Her therapy goal is clearly quality of life.  We will get her back in 1 month.  [name], MD 5/13/202011:47 AM

## 2018-12-02 NOTE — Patient Instructions (Signed)
[facility] Discharge Instructions for Patients Receiving Chemotherapy  Today you received the following chemotherapy agents Velcade To help prevent nausea and vomiting after your treatment, we encourage you to take your nausea medication as prescribed.   If you develop nausea and vomiting that is not controlled by your nausea medication, call the clinic.   BELOW ARE SYMPTOMS THAT SHOULD BE REPORTED IMMEDIATELY:  *FEVER GREATER THAN 100.5 F  *CHILLS WITH OR WITHOUT FEVER  NAUSEA AND VOMITING THAT IS NOT CONTROLLED WITH YOUR NAUSEA MEDICATION  *UNUSUAL SHORTNESS OF BREATH  *UNUSUAL BRUISING OR BLEEDING  TENDERNESS IN MOUTH AND THROAT WITH OR WITHOUT PRESENCE OF ULCERS  *URINARY PROBLEMS  *BOWEL PROBLEMS  UNUSUAL RASH Items with * indicate a potential emergency and should be followed up as soon as possible.  Feel free to call the clinic should you have any questions or concerns. The clinic phone number is [phone].  Please show the CHEMO ALERT CARD at check-in to the Emergency Department and triage nurse.   

## 2018-12-03 ENCOUNTER — Other Ambulatory Visit: Payer: Self-pay | Admitting: Internal Medicine

## 2018-12-03 ENCOUNTER — Encounter: Payer: Self-pay | Admitting: Hematology & Oncology

## 2018-12-03 ENCOUNTER — Telehealth: Payer: Self-pay | Admitting: Hematology & Oncology

## 2018-12-03 ENCOUNTER — Telehealth: Payer: Self-pay | Admitting: *Deleted

## 2018-12-03 DIAGNOSIS — K909 Intestinal malabsorption, unspecified: Secondary | ICD-10-CM | POA: Insufficient documentation

## 2018-12-03 DIAGNOSIS — E782 Mixed hyperlipidemia: Secondary | ICD-10-CM

## 2018-12-03 DIAGNOSIS — D500 Iron deficiency anemia secondary to blood loss (chronic): Secondary | ICD-10-CM | POA: Insufficient documentation

## 2018-12-03 DIAGNOSIS — C9000 Multiple myeloma not having achieved remission: Secondary | ICD-10-CM

## 2018-12-03 HISTORY — DX: Intestinal malabsorption, unspecified: K90.9

## 2018-12-03 HISTORY — DX: Iron deficiency anemia secondary to blood loss (chronic): D50.0

## 2018-12-03 LAB — KAPPA/LAMBDA LIGHT CHAINS
Kappa free light chain: 6.1 mg/L (ref 3.3–19.4)
Kappa, lambda light chain ratio: 1.74 — ABNORMAL HIGH (ref 0.26–1.65)
Lambda free light chains: 3.5 mg/L — ABNORMAL LOW (ref 5.7–26.3)

## 2018-12-03 LAB — FERRITIN: Ferritin: 6 ng/mL — ABNORMAL LOW (ref 11–307)

## 2018-12-03 LAB — IGG, IGA, IGM
IgA: 30 mg/dL — ABNORMAL LOW (ref 64–422)
IgG (Immunoglobin G), Serum: 222 mg/dL — ABNORMAL LOW (ref 586–1602)
IgM (Immunoglobulin M), Srm: 29 mg/dL (ref 26–217)

## 2018-12-03 LAB — IRON AND TIBC
Iron: 45 ug/dL (ref 41–142)
Saturation Ratios: 11 % — ABNORMAL LOW (ref 21–57)
TIBC: 404 ug/dL (ref 236–444)
UIBC: 359 ug/dL (ref 120–384)

## 2018-12-03 NOTE — Addendum Note (Signed)
Addended by: Burney Gauze R on: 12/03/2018 11:10 AM   Modules accepted: Orders

## 2018-12-03 NOTE — Telephone Encounter (Signed)
sw pt to confirm Injectafer appt 5/18 and 5/26 at 2 pm per 5/14 sch msg

## 2018-12-03 NOTE — Telephone Encounter (Signed)
Message left for patient to notify her per order of Dr. Marin Olp that iron is very low and that she will need 2 doses of injectafer.  Message sent to scheduling.

## 2018-12-03 NOTE — Telephone Encounter (Signed)
-----   Message from [name], MD sent at 12/03/2018 11:03 AM EDT ----- Call - her iron is very low!!!  She will need 2 doses of injectafer!!  [employer]

## 2018-12-04 LAB — PROTEIN ELECTROPHORESIS, SERUM, WITH REFLEX
A/G Ratio: 1.6 (ref 0.7–1.7)
Albumin ELP: 3.6 g/dL (ref 2.9–4.4)
Alpha-1-Globulin: 0.3 g/dL (ref 0.0–0.4)
Alpha-2-Globulin: 0.8 g/dL (ref 0.4–1.0)
Beta Globulin: 1 g/dL (ref 0.7–1.3)
Gamma Globulin: 0.2 g/dL — ABNORMAL LOW (ref 0.4–1.8)
Globulin, Total: 2.3 g/dL (ref 2.2–3.9)
M-Spike, %: 0.1 g/dL — ABNORMAL HIGH
SPEP Interpretation: 0
Total Protein ELP: 5.9 g/dL — ABNORMAL LOW (ref 6.0–8.5)

## 2018-12-04 LAB — IMMUNOFIXATION REFLEX, SERUM
IgA: 31 mg/dL — ABNORMAL LOW (ref 64–422)
IgG (Immunoglobin G), Serum: 225 mg/dL — ABNORMAL LOW (ref 586–1602)
IgM (Immunoglobulin M), Srm: 24 mg/dL — ABNORMAL LOW (ref 26–217)

## 2018-12-07 ENCOUNTER — Inpatient Hospital Stay: Payer: PPO

## 2018-12-07 ENCOUNTER — Other Ambulatory Visit: Payer: Self-pay

## 2018-12-07 VITALS — BP 125/60 | HR 187 | Temp 98.1°F | Resp 18

## 2018-12-07 DIAGNOSIS — C9000 Multiple myeloma not having achieved remission: Secondary | ICD-10-CM

## 2018-12-07 DIAGNOSIS — Z5112 Encounter for antineoplastic immunotherapy: Secondary | ICD-10-CM | POA: Diagnosis not present

## 2018-12-07 MED ORDER — SODIUM CHLORIDE 0.9 % IV SOLN
750.0000 mg | Freq: Once | INTRAVENOUS | Status: AC
  Administered 2018-12-07: 750 mg via INTRAVENOUS
  Filled 2018-12-07: qty 15

## 2018-12-07 MED ORDER — SODIUM CHLORIDE 0.9 % IV SOLN
INTRAVENOUS | Status: DC
  Administered 2018-12-07: 14:00:00 via INTRAVENOUS
  Filled 2018-12-07: qty 250

## 2018-12-07 NOTE — Patient Instructions (Signed)

## 2018-12-15 ENCOUNTER — Other Ambulatory Visit: Payer: Self-pay

## 2018-12-15 ENCOUNTER — Inpatient Hospital Stay: Payer: PPO

## 2018-12-15 VITALS — BP 149/72 | HR 91 | Temp 97.5°F | Resp 16

## 2018-12-15 DIAGNOSIS — Z5112 Encounter for antineoplastic immunotherapy: Secondary | ICD-10-CM | POA: Diagnosis not present

## 2018-12-15 DIAGNOSIS — C9000 Multiple myeloma not having achieved remission: Secondary | ICD-10-CM

## 2018-12-15 MED ORDER — ESCITALOPRAM OXALATE 20 MG PO TABS: ORAL_TABLET | ORAL | 3 refills | Status: DC

## 2018-12-15 MED ORDER — SODIUM CHLORIDE 0.9 % IV SOLN
INTRAVENOUS | Status: DC
  Administered 2018-12-15: 15:00:00 via INTRAVENOUS
  Filled 2018-12-15: qty 250

## 2018-12-15 MED ORDER — SODIUM CHLORIDE 0.9 % IV SOLN
750.0000 mg | Freq: Once | INTRAVENOUS | Status: AC
  Administered 2018-12-15: 750 mg via INTRAVENOUS
  Filled 2018-12-15: qty 15

## 2018-12-18 ENCOUNTER — Other Ambulatory Visit: Payer: PPO

## 2018-12-18 ENCOUNTER — Ambulatory Visit: Payer: PPO

## 2018-12-18 ENCOUNTER — Ambulatory Visit: Payer: PPO | Admitting: Hematology & Oncology

## 2018-12-30 ENCOUNTER — Other Ambulatory Visit: Payer: Self-pay

## 2018-12-30 ENCOUNTER — Encounter: Payer: Self-pay | Admitting: Hematology & Oncology

## 2018-12-30 ENCOUNTER — Inpatient Hospital Stay: Payer: PPO | Attending: Hematology & Oncology | Admitting: Hematology & Oncology

## 2018-12-30 ENCOUNTER — Inpatient Hospital Stay: Payer: PPO

## 2018-12-30 VITALS — BP 117/94 | HR 99 | Temp 98.3°F | Resp 20 | Wt 115.0 lb

## 2018-12-30 DIAGNOSIS — C9000 Multiple myeloma not having achieved remission: Secondary | ICD-10-CM

## 2018-12-30 DIAGNOSIS — D509 Iron deficiency anemia, unspecified: Secondary | ICD-10-CM

## 2018-12-30 DIAGNOSIS — H16103 Unspecified superficial keratitis, bilateral: Secondary | ICD-10-CM | POA: Diagnosis not present

## 2018-12-30 DIAGNOSIS — H1789 Other corneal scars and opacities: Secondary | ICD-10-CM | POA: Diagnosis not present

## 2018-12-30 DIAGNOSIS — Z5111 Encounter for antineoplastic chemotherapy: Secondary | ICD-10-CM | POA: Insufficient documentation

## 2018-12-30 DIAGNOSIS — D500 Iron deficiency anemia secondary to blood loss (chronic): Secondary | ICD-10-CM

## 2018-12-30 DIAGNOSIS — H04123 Dry eye syndrome of bilateral lacrimal glands: Secondary | ICD-10-CM | POA: Diagnosis not present

## 2018-12-30 DIAGNOSIS — Z5112 Encounter for antineoplastic immunotherapy: Secondary | ICD-10-CM | POA: Diagnosis present

## 2018-12-30 DIAGNOSIS — H25813 Combined forms of age-related cataract, bilateral: Secondary | ICD-10-CM | POA: Diagnosis not present

## 2018-12-30 LAB — CBC WITH DIFFERENTIAL (CANCER CENTER ONLY)
Abs Immature Granulocytes: 0.06 10*3/uL (ref 0.00–0.07)
Basophils Absolute: 0 10*3/uL (ref 0.0–0.1)
Basophils Relative: 0 %
Eosinophils Absolute: 0 10*3/uL (ref 0.0–0.5)
Eosinophils Relative: 0 %
HCT: 40.2 % (ref 36.0–46.0)
Hemoglobin: 12.9 g/dL (ref 12.0–15.0)
Immature Granulocytes: 1 %
Lymphocytes Relative: 14 %
Lymphs Abs: 1.2 10*3/uL (ref 0.7–4.0)
MCH: 31.1 pg (ref 26.0–34.0)
MCHC: 32.1 g/dL (ref 30.0–36.0)
MCV: 96.9 fL (ref 80.0–100.0)
Monocytes Absolute: 0.6 10*3/uL (ref 0.1–1.0)
Monocytes Relative: 7 %
Neutro Abs: 6.5 10*3/uL (ref 1.7–7.7)
Neutrophils Relative %: 78 %
Platelet Count: 225 10*3/uL (ref 150–400)
RBC: 4.15 MIL/uL (ref 3.87–5.11)
RDW: 17.6 % — ABNORMAL HIGH (ref 11.5–15.5)
WBC Count: 8.3 10*3/uL (ref 4.0–10.5)
nRBC: 0 % (ref 0.0–0.2)

## 2018-12-30 LAB — CMP (CANCER CENTER ONLY)
ALT: 27 U/L (ref 0–44)
AST: 22 U/L (ref 15–41)
Albumin: 4.5 g/dL (ref 3.5–5.0)
Alkaline Phosphatase: 63 U/L (ref 38–126)
Anion gap: 10 (ref 5–15)
BUN: 15 mg/dL (ref 8–23)
CO2: 26 mmol/L (ref 22–32)
Calcium: 9.7 mg/dL (ref 8.9–10.3)
Chloride: 104 mmol/L (ref 98–111)
Creatinine: 0.7 mg/dL (ref 0.44–1.00)
GFR, Est AFR Am: >60 mL/min (ref >60)
GFR, Estimated: >60 mL/min (ref >60)
Glucose, Bld: 125 mg/dL — ABNORMAL HIGH (ref 70–99)
Potassium: 3.7 mmol/L (ref 3.5–5.1)
Sodium: 140 mmol/L (ref 135–145)
Total Bilirubin: 0.4 mg/dL (ref 0.3–1.2)
Total Protein: 6.5 g/dL (ref 6.5–8.1)

## 2018-12-30 MED ORDER — BORTEZOMIB CHEMO SQ INJECTION 3.5 MG (2.5MG/ML)
1.3000 mg/m2 | Freq: Once | INTRAMUSCULAR | Status: AC
  Administered 2018-12-30: 2 mg via SUBCUTANEOUS
  Filled 2018-12-30: qty 0.8

## 2018-12-30 MED ORDER — PROCHLORPERAZINE MALEATE 10 MG PO TABS
10.0000 mg | ORAL_TABLET | Freq: Once | ORAL | Status: AC
  Administered 2018-12-30: 12:00:00 10 mg via ORAL

## 2018-12-30 MED ORDER — PROCHLORPERAZINE MALEATE 10 MG PO TABS
ORAL_TABLET | ORAL | Status: AC
  Filled 2018-12-30: qty 1

## 2018-12-30 NOTE — Patient Instructions (Signed)

## 2018-12-30 NOTE — Progress Notes (Signed)
Hematology and Oncology Follow Up Visit  Debra Ruiz [id number] 06/22/1946 72 y.o. 12/30/2018   Principle Diagnosis:  IgG Kappa myeloma - compression fractures - 13q-, +11, 14q+ - hyperdiploid -- progressive Iron def anemia  Completed Therapy: S/p Kyphoplasty of T11 and T12 XRT to lower spine-completed on 09/25/2016  Current Therapy:   Velcade/Pomalyst/Decadron -Start on 09/02/2018 --Pomalist on hold since 11/09/2018. Xgeva '120mg'$  Sq q  3 month - next dose in July 2020 IV Injectafer -- last dose given on 11/2018   Interim History:  Debra Ruiz is here today for follow-up.  She is not feeling all that well.  She feels it might be from the pomalidomide.  Right now, we are holding the pomalidomide.  We did give her IV iron when she was here back in May.  This really helped her quite a bit.  Her ferritin was only 6.  After the second dose of iron, this really got her quality of life much better.  She had more energy and just had more stamina.   Her myeloma studies have done quite well.  M spike is 0.1 g/dL.  IgG level is 222 mg/dL.  Her kappa light chain is 0.6 mg/dL.  Her appetite is down a little bit, but this is getting better.  There is no nausea or vomiting.  She has had no diarrhea.  She is had no cough.  There is been no headache.  Has had no mouth sores.  Overall, her performance status is ECOG 1.    Medications:  Allergies as of 12/30/2018      Reactions   Crestor [rosuvastatin Calcium]    Causes liver functions to be elevated      Medication List       Accurate as of December 30, 2018 11:45 AM. If you have any questions, ask your nurse or doctor.        ALPRAZolam 0.25 MG tablet Commonly known as:  XANAX Take 1 tablet (0.25 mg total) by mouth 2 (two) times daily as needed. for anxiety   amLODipine 5 MG tablet Commonly known as:  NORVASC TAKE 1 TABLET(5 MG) BY MOUTH DAILY   aspirin 325 MG tablet Take 325 mg by mouth daily.   atorvastatin 20 MG tablet  Commonly known as:  LIPITOR TAKE 1 TABLET(20 MG) BY MOUTH DAILY   buPROPion 300 MG 24 hr tablet Commonly known as:  Wellbutrin XL Take 1 tablet (300 mg total) by mouth daily.   Bystolic 5 MG tablet Generic drug:  nebivolol TAKE 1 TABLET(5 MG) BY MOUTH DAILY   dexamethasone 4 MG tablet Commonly known as:  DECADRON TAKE 3 TABLETS BY MOUTH ONCE A WEEK   escitalopram 20 MG tablet Commonly known as:  LEXAPRO TAKE 1 TABLET(20 MG) BY MOUTH DAILY   famciclovir 250 MG tablet Commonly known as:  FAMVIR TAKE 1 TABLET(250 MG) BY MOUTH DAILY   pomalidomide 3 MG capsule Commonly known as:  POMALYST Take with water on days 1-14 and off days 15-28.Marland Kitchen Repeat every 28 days. [id number]   potassium chloride SA 20 MEQ tablet Commonly known as:  K-DUR TAKE 1 TABLET(20 MEQ) BY MOUTH DAILY   prochlorperazine 10 MG tablet Commonly known as:  COMPAZINE TAKE 1 TABLET(10 MG) BY MOUTH EVERY 6 HOURS AS NEEDED FOR NAUSEA OR VOMITING   Vitamin D (Ergocalciferol) 50 MCG (2000 UT) Caps Take 2,000 Units by mouth daily.   Vitamin D 50 MCG (2000 UT) Caps Take 2,000 Units by mouth daily.  Allergies:  Allergies  Allergen Reactions  . Crestor [Rosuvastatin Calcium]     Causes liver functions to be elevated    Past Medical History, Surgical history, Social history, and Family History were reviewed and updated.  Review of Systems: Review of Systems  Constitutional: Negative.   HENT: Negative.   Eyes: Negative.   Respiratory: Negative.   Cardiovascular: Negative.   Gastrointestinal: Negative.   Genitourinary: Negative.   Musculoskeletal: Positive for back pain.  Skin: Negative.   Neurological: Negative.   Endo/Heme/Allergies: Negative.   Psychiatric/Behavioral: Negative.     Physical Exam:  weight is 115 lb (52.2 kg). Her oral temperature is 98.3 F (36.8 C). Her blood pressure is 117/94 (abnormal) and her pulse is 99. Her respiration is 20 and oxygen saturation is 100%.   Wt  Readings from Last 3 Encounters:  12/30/18 115 lb (52.2 kg)  12/02/18 115 lb 12.8 oz (52.5 kg)  11/03/18 115 lb (52.2 kg)    Physical Exam Vitals signs reviewed.  HENT:     Head: Normocephalic and atraumatic.  Eyes:     Pupils: Pupils are equal, round, and reactive to light.  Neck:     Musculoskeletal: Normal range of motion.  Cardiovascular:     Rate and Rhythm: Normal rate and regular rhythm.     Heart sounds: Normal heart sounds.  Pulmonary:     Effort: Pulmonary effort is normal.     Breath sounds: Normal breath sounds.  Abdominal:     General: Bowel sounds are normal.     Palpations: Abdomen is soft.  Musculoskeletal: Normal range of motion.        General: No tenderness or deformity.  Lymphadenopathy:     Cervical: No cervical adenopathy.  Skin:    General: Skin is warm and dry.     Findings: No erythema or rash.  Neurological:     Mental Status: She is alert and oriented to person, place, and time.  Psychiatric:        Behavior: Behavior normal.        Thought Content: Thought content normal.        Judgment: Judgment normal.       Lab Results  Component Value Date   WBC 8.3 12/30/2018   HGB 12.9 12/30/2018   HCT 40.2 12/30/2018   MCV 96.9 12/30/2018   PLT 225 12/30/2018   Lab Results  Component Value Date   FERRITIN 6 (L) 12/02/2018   IRON 45 12/02/2018   TIBC 404 12/02/2018   UIBC 359 12/02/2018   IRONPCTSAT 11 (L) 12/02/2018   Lab Results  Component Value Date   RBC 4.15 12/30/2018   Lab Results  Component Value Date   KPAFRELGTCHN 6.1 12/02/2018   LAMBDASER 3.5 (L) 12/02/2018   KAPLAMBRATIO 1.74 (H) 12/02/2018   Lab Results  Component Value Date   IGGSERUM 222 (L) 12/02/2018   IGGSERUM 225 (L) 12/02/2018   IGA 30 (L) 12/02/2018   IGA 31 (L) 12/02/2018   IGMSERUM 29 12/02/2018   IGMSERUM 24 (L) 12/02/2018   Lab Results  Component Value Date   TOTALPROTELP 5.9 (L) 12/02/2018   ALBUMINELP 3.6 12/02/2018   A1GS 0.3 12/02/2018    A2GS 0.8 12/02/2018   BETS 1.0 12/02/2018   GAMS 0.2 (L) 12/02/2018   MSPIKE 0.1 (H) 12/02/2018   SPEI Comment 01/28/2018     Chemistry      Component Value Date/Time   NA 140 12/30/2018 1016   NA 148 (H) 07/16/2017  1128   NA 143 03/05/2017 1051   K 3.7 12/30/2018 1016   K 3.8 07/16/2017 1128   K 3.7 03/05/2017 1051   CL 104 12/30/2018 1016   CL 106 07/16/2017 1128   CO2 26 12/30/2018 1016   CO2 29 07/16/2017 1128   CO2 25 03/05/2017 1051   BUN 15 12/30/2018 1016   BUN 23 (H) 07/16/2017 1128   BUN 18.2 03/05/2017 1051   CREATININE 0.70 12/30/2018 1016   CREATININE 0.69 05/07/2018 1233   CREATININE 0.8 03/05/2017 1051      Component Value Date/Time   CALCIUM 9.7 12/30/2018 1016   CALCIUM 9.7 07/16/2017 1128   CALCIUM 8.9 03/05/2017 1051   ALKPHOS 63 12/30/2018 1016   ALKPHOS 71 07/16/2017 1128   ALKPHOS 67 03/05/2017 1051   AST 22 12/30/2018 1016   AST 15 03/05/2017 1051   ALT 27 12/30/2018 1016   ALT 23 07/16/2017 1128   ALT 17 03/05/2017 1051   BILITOT 0.4 12/30/2018 1016   BILITOT 0.55 03/05/2017 1051         Impression and Plan: Debra Ruiz is a very pleasant 73 yo caucasian female with IgG Kappa Myeloma.    I am just glad that her quality of life is doing better now.  The iron definitely was a factor with this.  Again, we will hold the pomalidomide.  We will have just monitor her myeloma levels.  I know she plans are going on a vacation in July.  I am sure that we can work around that vacation with respect to her treatments.    [name], MD 6/10/202011:45 AM

## 2018-12-31 ENCOUNTER — Telehealth: Payer: Self-pay | Admitting: *Deleted

## 2018-12-31 ENCOUNTER — Other Ambulatory Visit: Payer: Self-pay | Admitting: *Deleted

## 2018-12-31 LAB — KAPPA/LAMBDA LIGHT CHAINS
Kappa free light chain: 3.7 mg/L (ref 3.3–19.4)
Kappa, lambda light chain ratio: 2.06 — ABNORMAL HIGH (ref 0.26–1.65)
Lambda free light chains: 1.8 mg/L — ABNORMAL LOW (ref 5.7–26.3)

## 2018-12-31 LAB — IGG, IGA, IGM
IgA: 36 mg/dL — ABNORMAL LOW (ref 64–422)
IgG (Immunoglobin G), Serum: 246 mg/dL — ABNORMAL LOW (ref 586–1602)
IgM (Immunoglobulin M), Srm: 41 mg/dL (ref 26–217)

## 2018-12-31 MED ORDER — FUSION PLUS PO CAPS: ORAL_CAPSULE | ORAL | 2 refills | Status: DC

## 2018-12-31 NOTE — Telephone Encounter (Signed)
Pt calling for a prescription for fusion iron which her and Dr. Marin Olp discussed at yesterday's appointment.  Dr. Marin Olp notified and order received for fusion plus daily. Prescription sent to pt.'s pharmacy per her request.

## 2019-01-04 LAB — IMMUNOFIXATION REFLEX, SERUM
IgA: 35 mg/dL — ABNORMAL LOW (ref 64–422)
IgG (Immunoglobin G), Serum: 243 mg/dL — ABNORMAL LOW (ref 586–1602)
IgM (Immunoglobulin M), Srm: 39 mg/dL (ref 26–217)

## 2019-01-04 LAB — PROTEIN ELECTROPHORESIS, SERUM, WITH REFLEX
A/G Ratio: 1.7 (ref 0.7–1.7)
Albumin ELP: 3.8 g/dL (ref 2.9–4.4)
Alpha-1-Globulin: 0.3 g/dL (ref 0.0–0.4)
Alpha-2-Globulin: 0.8 g/dL (ref 0.4–1.0)
Beta Globulin: 0.9 g/dL (ref 0.7–1.3)
Gamma Globulin: 0.2 g/dL — ABNORMAL LOW (ref 0.4–1.8)
Globulin, Total: 2.2 g/dL (ref 2.2–3.9)
M-Spike, %: 0.1 g/dL — ABNORMAL HIGH
SPEP Interpretation: 0
Total Protein ELP: 6 g/dL (ref 6.0–8.5)

## 2019-01-11 DIAGNOSIS — H04122 Dry eye syndrome of left lacrimal gland: Secondary | ICD-10-CM | POA: Diagnosis not present

## 2019-01-11 DIAGNOSIS — H04121 Dry eye syndrome of right lacrimal gland: Secondary | ICD-10-CM | POA: Diagnosis not present

## 2019-01-18 DIAGNOSIS — Z9889 Other specified postprocedural states: Secondary | ICD-10-CM | POA: Diagnosis not present

## 2019-01-18 DIAGNOSIS — H2513 Age-related nuclear cataract, bilateral: Secondary | ICD-10-CM | POA: Diagnosis not present

## 2019-01-18 DIAGNOSIS — H25013 Cortical age-related cataract, bilateral: Secondary | ICD-10-CM | POA: Diagnosis not present

## 2019-01-18 DIAGNOSIS — H25043 Posterior subcapsular polar age-related cataract, bilateral: Secondary | ICD-10-CM | POA: Diagnosis not present

## 2019-01-27 ENCOUNTER — Inpatient Hospital Stay: Payer: PPO

## 2019-01-27 ENCOUNTER — Telehealth: Payer: Self-pay | Admitting: Hematology & Oncology

## 2019-01-27 ENCOUNTER — Inpatient Hospital Stay: Payer: PPO | Attending: Hematology & Oncology | Admitting: Hematology & Oncology

## 2019-01-27 ENCOUNTER — Other Ambulatory Visit: Payer: Self-pay

## 2019-01-27 ENCOUNTER — Encounter: Payer: Self-pay | Admitting: Hematology & Oncology

## 2019-01-27 VITALS — BP 130/70 | HR 87 | Temp 97.7°F | Resp 20 | Wt 115.0 lb

## 2019-01-27 DIAGNOSIS — Z5112 Encounter for antineoplastic immunotherapy: Secondary | ICD-10-CM | POA: Insufficient documentation

## 2019-01-27 DIAGNOSIS — D500 Iron deficiency anemia secondary to blood loss (chronic): Secondary | ICD-10-CM

## 2019-01-27 DIAGNOSIS — D509 Iron deficiency anemia, unspecified: Secondary | ICD-10-CM | POA: Insufficient documentation

## 2019-01-27 DIAGNOSIS — C9000 Multiple myeloma not having achieved remission: Secondary | ICD-10-CM

## 2019-01-27 LAB — CBC WITH DIFFERENTIAL (CANCER CENTER ONLY)
Abs Immature Granulocytes: 0.06 10*3/uL (ref 0.00–0.07)
Basophils Absolute: 0 10*3/uL (ref 0.0–0.1)
Basophils Relative: 0 %
Eosinophils Absolute: 0.1 10*3/uL (ref 0.0–0.5)
Eosinophils Relative: 1 %
HCT: 40.2 % (ref 36.0–46.0)
Hemoglobin: 13.2 g/dL (ref 12.0–15.0)
Immature Granulocytes: 1 %
Lymphocytes Relative: 20 %
Lymphs Abs: 1.5 10*3/uL (ref 0.7–4.0)
MCH: 31.9 pg (ref 26.0–34.0)
MCHC: 32.8 g/dL (ref 30.0–36.0)
MCV: 97.1 fL (ref 80.0–100.0)
Monocytes Absolute: 0.8 10*3/uL (ref 0.1–1.0)
Monocytes Relative: 10 %
Neutro Abs: 5 10*3/uL (ref 1.7–7.7)
Neutrophils Relative %: 68 %
Platelet Count: 213 10*3/uL (ref 150–400)
RBC: 4.14 MIL/uL (ref 3.87–5.11)
RDW: 16.8 % — ABNORMAL HIGH (ref 11.5–15.5)
WBC Count: 7.5 10*3/uL (ref 4.0–10.5)
nRBC: 0 % (ref 0.0–0.2)

## 2019-01-27 LAB — CMP (CANCER CENTER ONLY)
ALT: 20 U/L (ref 0–44)
AST: 22 U/L (ref 15–41)
Albumin: 4.1 g/dL (ref 3.5–5.0)
Alkaline Phosphatase: 62 U/L (ref 38–126)
Anion gap: 8 (ref 5–15)
BUN: 14 mg/dL (ref 8–23)
CO2: 26 mmol/L (ref 22–32)
Calcium: 9 mg/dL (ref 8.9–10.3)
Chloride: 107 mmol/L (ref 98–111)
Creatinine: 0.62 mg/dL (ref 0.44–1.00)
GFR, Est AFR Am: >60 mL/min (ref >60)
GFR, Estimated: >60 mL/min (ref >60)
Glucose, Bld: 99 mg/dL (ref 70–99)
Potassium: 3.7 mmol/L (ref 3.5–5.1)
Sodium: 141 mmol/L (ref 135–145)
Total Bilirubin: 0.5 mg/dL (ref 0.3–1.2)
Total Protein: 6.2 g/dL — ABNORMAL LOW (ref 6.5–8.1)

## 2019-01-27 MED ORDER — DENOSUMAB 120 MG/1.7ML ~~LOC~~ SOLN
120.0000 mg | Freq: Once | SUBCUTANEOUS | Status: AC
  Administered 2019-01-27: 120 mg via SUBCUTANEOUS

## 2019-01-27 MED ORDER — BORTEZOMIB CHEMO SQ INJECTION 3.5 MG (2.5MG/ML)
1.3000 mg/m2 | Freq: Once | INTRAMUSCULAR | Status: AC
  Administered 2019-01-27: 2 mg via SUBCUTANEOUS
  Filled 2019-01-27: qty 0.8

## 2019-01-27 MED ORDER — DENOSUMAB 120 MG/1.7ML ~~LOC~~ SOLN
SUBCUTANEOUS | Status: AC
  Filled 2019-01-27: qty 1.7

## 2019-01-27 MED ORDER — PROCHLORPERAZINE MALEATE 10 MG PO TABS: 10.0000 mg | ORAL_TABLET | Freq: Once | ORAL | Status: DC

## 2019-01-27 NOTE — Progress Notes (Signed)
Hematology and Oncology Follow Up Visit  Debra Ruiz [id number] 1945/11/11 72 y.o. 01/27/2019   Principle Diagnosis:  IgG Kappa myeloma - compression fractures - 13q-, +11, 14q+ - hyperdiploid -- progressive Iron def anemia  Completed Therapy: S/p Kyphoplasty of T11 and T12 XRT to lower spine-completed on 09/25/2016  Current Therapy:   Velcade/Pomalyst/Decadron -Start on 09/02/2018 --Pomalyst on hold since 11/09/2018. Xgeva 146m Sq q  3 month - next dose in October 2020 IV Injectafer -- last dose given on 11/2018   Interim History:  Debra Ruiz here today for follow-up.  She feels a lot better being on the pomalidomide.  I also think that the iron that she took back in May has also helped quite a bit.  I told her that it would not surprise me if her myeloma levels started going back up.  If so, I would just get her back on pomalidomide at 2 mg daily.  When we last saw her, her M spike was 0.1 g/dL.  Her IgG level was 246 mg/dL.  Her kappa light chain 0.4 mg/dL.  She and her [place] will go to the mountains soon.  They will be with friends.  I told her that I thought that it would be fine to go to the mountains.  She feels better.  Again, I have to believe that the iron that she took is helping her.  She currently is on oral iron supplementation.  Apparently, there is problems with manufacturing this.  She has had no issues with bowels or bladder.  She does have some chronic back issues.  Overall, her performance status is ECOG 1.    Medications:  Allergies as of 01/27/2019      Reactions   Crestor [rosuvastatin Calcium]    Causes liver functions to be elevated      Medication List       Accurate as of January 27, 2019  1:16 PM. If you have any questions, ask your nurse or doctor.        ALPRAZolam 0.25 MG tablet Commonly known as: XANAX Take 1 tablet (0.25 mg total) by mouth 2 (two) times daily as needed. for anxiety   amLODipine 5 MG tablet Commonly known as:  NORVASC TAKE 1 TABLET(5 MG) BY MOUTH DAILY   aspirin 81 MG EC tablet Take 81 mg by mouth daily.   atorvastatin 20 MG tablet Commonly known as: LIPITOR TAKE 1 TABLET(20 MG) BY MOUTH DAILY   buPROPion 300 MG 24 hr tablet Commonly known as: Wellbutrin XL Take 1 tablet (300 mg total) by mouth daily.   Bystolic 5 MG tablet Generic drug: nebivolol TAKE 1 TABLET(5 MG) BY MOUTH DAILY   dexamethasone 4 MG tablet Commonly known as: DECADRON TAKE 3 TABLETS BY MOUTH ONCE A WEEK   escitalopram 20 MG tablet Commonly known as: LEXAPRO TAKE 1 TABLET(20 MG) BY MOUTH DAILY   famciclovir 250 MG tablet Commonly known as: FAMVIR TAKE 1 TABLET(250 MG) BY MOUTH DAILY   Fusion Plus Caps Take one capsule daily   pomalidomide 3 MG capsule Commonly known as: POMALYST Take with water on days 1-14 and off days 15-28..Marland KitchenRepeat every 28 days. [id number]  potassium chloride SA 20 MEQ tablet Commonly known as: K-DUR TAKE 1 TABLET(20 MEQ) BY MOUTH DAILY   prochlorperazine 10 MG tablet Commonly known as: COMPAZINE TAKE 1 TABLET(10 MG) BY MOUTH EVERY 6 HOURS AS NEEDED FOR NAUSEA OR VOMITING   Vitamin D (Ergocalciferol) 50 MCG (2000 UT) Caps  Take 2,000 Units by mouth daily.   Vitamin D 50 MCG (2000 UT) Caps Take 2,000 Units by mouth daily.       Allergies:  Allergies  Allergen Reactions  . Crestor [Rosuvastatin Calcium]     Causes liver functions to be elevated    Past Medical History, Surgical history, Social history, and Family History were reviewed and updated.  Review of Systems: Review of Systems  Constitutional: Negative.   HENT: Negative.   Eyes: Negative.   Respiratory: Negative.   Cardiovascular: Negative.   Gastrointestinal: Negative.   Genitourinary: Negative.   Musculoskeletal: Positive for back pain.  Skin: Negative.   Neurological: Negative.   Endo/Heme/Allergies: Negative.   Psychiatric/Behavioral: Negative.     Physical Exam:  weight is 115 lb (52.2 kg).  Her oral temperature is 97.7 F (36.5 C). Her blood pressure is 130/70 and her pulse is 87. Her respiration is 20 and oxygen saturation is 97%.   Wt Readings from Last 3 Encounters:  01/27/19 115 lb (52.2 kg)  12/30/18 115 lb (52.2 kg)  12/02/18 115 lb 12.8 oz (52.5 kg)    Physical Exam Vitals signs reviewed.  HENT:     Head: Normocephalic and atraumatic.  Eyes:     Pupils: Pupils are equal, round, and reactive to light.  Neck:     Musculoskeletal: Normal range of motion.  Cardiovascular:     Rate and Rhythm: Normal rate and regular rhythm.     Heart sounds: Normal heart sounds.  Pulmonary:     Effort: Pulmonary effort is normal.     Breath sounds: Normal breath sounds.  Abdominal:     General: Bowel sounds are normal.     Palpations: Abdomen is soft.  Musculoskeletal: Normal range of motion.        General: No tenderness or deformity.  Lymphadenopathy:     Cervical: No cervical adenopathy.  Skin:    General: Skin is warm and dry.     Findings: No erythema or rash.  Neurological:     Mental Status: She is alert and oriented to person, place, and time.  Psychiatric:        Behavior: Behavior normal.        Thought Content: Thought content normal.        Judgment: Judgment normal.       Lab Results  Component Value Date   WBC 7.5 01/27/2019   HGB 13.2 01/27/2019   HCT 40.2 01/27/2019   MCV 97.1 01/27/2019   PLT 213 01/27/2019   Lab Results  Component Value Date   FERRITIN 6 (L) 12/02/2018   IRON 45 12/02/2018   TIBC 404 12/02/2018   UIBC 359 12/02/2018   IRONPCTSAT 11 (L) 12/02/2018   Lab Results  Component Value Date   RBC 4.14 01/27/2019   Lab Results  Component Value Date   KPAFRELGTCHN 3.7 12/30/2018   LAMBDASER 1.8 (L) 12/30/2018   KAPLAMBRATIO 2.06 (H) 12/30/2018   Lab Results  Component Value Date   IGGSERUM 246 (L) 12/30/2018   IGGSERUM 243 (L) 12/30/2018   IGA 36 (L) 12/30/2018   IGA 35 (L) 12/30/2018   IGMSERUM 41 12/30/2018    IGMSERUM 39 12/30/2018   Lab Results  Component Value Date   TOTALPROTELP 6.0 12/30/2018   ALBUMINELP 3.8 12/30/2018   A1GS 0.3 12/30/2018   A2GS 0.8 12/30/2018   BETS 0.9 12/30/2018   GAMS 0.2 (L) 12/30/2018   MSPIKE 0.1 (H) 12/30/2018   SPEI Comment 01/28/2018  Chemistry      Component Value Date/Time   NA 141 01/27/2019 1201   NA 148 (H) 07/16/2017 1128   NA 143 03/05/2017 1051   K 3.7 01/27/2019 1201   K 3.8 07/16/2017 1128   K 3.7 03/05/2017 1051   CL 107 01/27/2019 1201   CL 106 07/16/2017 1128   CO2 26 01/27/2019 1201   CO2 29 07/16/2017 1128   CO2 25 03/05/2017 1051   BUN 14 01/27/2019 1201   BUN 23 (H) 07/16/2017 1128   BUN 18.2 03/05/2017 1051   CREATININE 0.62 01/27/2019 1201   CREATININE 0.69 05/07/2018 1233   CREATININE 0.8 03/05/2017 1051      Component Value Date/Time   CALCIUM 9.0 01/27/2019 1201   CALCIUM 9.7 07/16/2017 1128   CALCIUM 8.9 03/05/2017 1051   ALKPHOS 62 01/27/2019 1201   ALKPHOS 71 07/16/2017 1128   ALKPHOS 67 03/05/2017 1051   AST 22 01/27/2019 1201   AST 15 03/05/2017 1051   ALT 20 01/27/2019 1201   ALT 23 07/16/2017 1128   ALT 17 03/05/2017 1051   BILITOT 0.5 01/27/2019 1201   BILITOT 0.55 03/05/2017 1051         Impression and Plan: Debra Ruiz is a very pleasant 73 yo caucasian female with IgG Kappa Myeloma.    I am just glad that her quality of life is doing better now.  The iron definitely was a factor with this.  Of the pomalidomide is also helping.  However, I will get her back on pomalidomide at 2 mg daily dose if we see that her M spike is going back up.  She will get her Delton See today.  I will plan to see her back in another 4 weeks.    [name], MD 7/8/20201:16 PM

## 2019-01-27 NOTE — Telephone Encounter (Signed)
Appointments scheduled patient declined calendar due to My Chart per 7/8 los

## 2019-01-28 LAB — IRON AND TIBC
Iron: 130 ug/dL (ref 41–142)
Saturation Ratios: 44 % (ref 21–57)
TIBC: 294 ug/dL (ref 236–444)
UIBC: 164 ug/dL (ref 120–384)

## 2019-01-28 LAB — KAPPA/LAMBDA LIGHT CHAINS
Kappa free light chain: 4.6 mg/L (ref 3.3–19.4)
Kappa, lambda light chain ratio: 0.85 (ref 0.26–1.65)
Lambda free light chains: 5.4 mg/L — ABNORMAL LOW (ref 5.7–26.3)

## 2019-01-28 LAB — IGG, IGA, IGM
IgA: 30 mg/dL — ABNORMAL LOW (ref 64–422)
IgG (Immunoglobin G), Serum: 237 mg/dL — ABNORMAL LOW (ref 586–1602)
IgM (Immunoglobulin M), Srm: 31 mg/dL (ref 26–217)

## 2019-01-28 LAB — FERRITIN: Ferritin: 486 ng/mL — ABNORMAL HIGH (ref 11–307)

## 2019-02-02 LAB — PROTEIN ELECTROPHORESIS, SERUM, WITH REFLEX
A/G Ratio: 2.1 — ABNORMAL HIGH (ref 0.7–1.7)
Albumin ELP: 3.9 g/dL (ref 2.9–4.4)
Alpha-1-Globulin: 0.2 g/dL (ref 0.0–0.4)
Alpha-2-Globulin: 0.8 g/dL (ref 0.4–1.0)
Beta Globulin: 0.7 g/dL (ref 0.7–1.3)
Gamma Globulin: 0.2 g/dL — ABNORMAL LOW (ref 0.4–1.8)
Globulin, Total: 1.9 g/dL — ABNORMAL LOW (ref 2.2–3.9)
SPEP Interpretation: 0
Total Protein ELP: 5.8 g/dL — ABNORMAL LOW (ref 6.0–8.5)

## 2019-02-02 LAB — IMMUNOFIXATION REFLEX, SERUM
IgA: 33 mg/dL — ABNORMAL LOW (ref 64–422)
IgG (Immunoglobin G), Serum: 272 mg/dL — ABNORMAL LOW (ref 586–1602)
IgM (Immunoglobulin M), Srm: 35 mg/dL (ref 26–217)

## 2019-02-03 ENCOUNTER — Telehealth: Payer: Self-pay | Admitting: *Deleted

## 2019-02-03 NOTE — Telephone Encounter (Addendum)
Patient is aware of results  ----- Message from [name], MD sent at 02/03/2019  9:05 AM EDT ----- Call - myeloma is back in remission!!!  Debra Ruiz

## 2019-02-25 ENCOUNTER — Inpatient Hospital Stay: Payer: PPO | Attending: Hematology & Oncology

## 2019-02-25 ENCOUNTER — Encounter: Payer: Self-pay | Admitting: Hematology & Oncology

## 2019-02-25 ENCOUNTER — Inpatient Hospital Stay (HOSPITAL_BASED_OUTPATIENT_CLINIC_OR_DEPARTMENT_OTHER): Payer: PPO | Admitting: Hematology & Oncology

## 2019-02-25 ENCOUNTER — Inpatient Hospital Stay: Payer: PPO

## 2019-02-25 ENCOUNTER — Other Ambulatory Visit: Payer: Self-pay

## 2019-02-25 VITALS — BP 130/73 | HR 93 | Temp 97.3°F | Resp 20 | Wt 114.4 lb

## 2019-02-25 DIAGNOSIS — C9000 Multiple myeloma not having achieved remission: Secondary | ICD-10-CM

## 2019-02-25 DIAGNOSIS — D509 Iron deficiency anemia, unspecified: Secondary | ICD-10-CM | POA: Insufficient documentation

## 2019-02-25 DIAGNOSIS — Z5112 Encounter for antineoplastic immunotherapy: Secondary | ICD-10-CM | POA: Insufficient documentation

## 2019-02-25 DIAGNOSIS — D500 Iron deficiency anemia secondary to blood loss (chronic): Secondary | ICD-10-CM

## 2019-02-25 LAB — CBC WITH DIFFERENTIAL (CANCER CENTER ONLY)
Abs Immature Granulocytes: 0.03 10*3/uL (ref 0.00–0.07)
Basophils Absolute: 0 10*3/uL (ref 0.0–0.1)
Basophils Relative: 0 %
Eosinophils Absolute: 0.1 10*3/uL (ref 0.0–0.5)
Eosinophils Relative: 2 %
HCT: 47 % — ABNORMAL HIGH (ref 36.0–46.0)
Hemoglobin: 15.5 g/dL — ABNORMAL HIGH (ref 12.0–15.0)
Immature Granulocytes: 0 %
Lymphocytes Relative: 18 %
Lymphs Abs: 1.4 10*3/uL (ref 0.7–4.0)
MCH: 32.4 pg (ref 26.0–34.0)
MCHC: 33 g/dL (ref 30.0–36.0)
MCV: 98.3 fL (ref 80.0–100.0)
Monocytes Absolute: 0.7 10*3/uL (ref 0.1–1.0)
Monocytes Relative: 9 %
Neutro Abs: 5.4 10*3/uL (ref 1.7–7.7)
Neutrophils Relative %: 71 %
Platelet Count: 247 10*3/uL (ref 150–400)
RBC: 4.78 MIL/uL (ref 3.87–5.11)
RDW: 15.6 % — ABNORMAL HIGH (ref 11.5–15.5)
WBC Count: 7.7 10*3/uL (ref 4.0–10.5)
nRBC: 0 % (ref 0.0–0.2)

## 2019-02-25 LAB — CMP (CANCER CENTER ONLY)
ALT: 18 U/L (ref 0–44)
AST: 17 U/L (ref 15–41)
Albumin: 4.4 g/dL (ref 3.5–5.0)
Alkaline Phosphatase: 64 U/L (ref 38–126)
Anion gap: 9 (ref 5–15)
BUN: 14 mg/dL (ref 8–23)
CO2: 30 mmol/L (ref 22–32)
Calcium: 8.7 mg/dL — ABNORMAL LOW (ref 8.9–10.3)
Chloride: 104 mmol/L (ref 98–111)
Creatinine: 0.7 mg/dL (ref 0.44–1.00)
GFR, Est AFR Am: >60 mL/min (ref >60)
GFR, Estimated: >60 mL/min (ref >60)
Glucose, Bld: 106 mg/dL — ABNORMAL HIGH (ref 70–99)
Potassium: 3.8 mmol/L (ref 3.5–5.1)
Sodium: 143 mmol/L (ref 135–145)
Total Bilirubin: 0.5 mg/dL (ref 0.3–1.2)
Total Protein: 6.3 g/dL — ABNORMAL LOW (ref 6.5–8.1)

## 2019-02-25 MED ORDER — PROCHLORPERAZINE MALEATE 10 MG PO TABS: 10.0000 mg | ORAL_TABLET | Freq: Once | ORAL | Status: DC

## 2019-02-25 MED ORDER — BORTEZOMIB CHEMO SQ INJECTION 3.5 MG (2.5MG/ML)
1.3000 mg/m2 | Freq: Once | INTRAMUSCULAR | Status: AC
  Administered 2019-02-25: 2 mg via SUBCUTANEOUS
  Filled 2019-02-25: qty 0.8

## 2019-02-25 NOTE — Patient Instructions (Signed)
Bortezomib injection What is this medicine? BORTEZOMIB (bor TEZ oh mib) is a medicine that targets proteins in cancer cells and stops the cancer cells from growing. It is used to treat multiple myeloma and mantle-cell lymphoma. This medicine may be used for other purposes; ask your health care provider or pharmacist if you have questions. COMMON BRAND NAME(S): Velcade What should I tell my health care provider before I take this medicine? They need to know if you have any of these conditions:  diabetes  heart disease  irregular heartbeat  liver disease  on hemodialysis  low blood counts, like low white blood cells, platelets, or hemoglobin  peripheral neuropathy  taking medicine for blood pressure  an unusual or allergic reaction to bortezomib, mannitol, boron, other medicines, foods, dyes, or preservatives  pregnant or trying to get pregnant  breast-feeding How should I use this medicine? This medicine is for injection into a vein or for injection under the skin. It is given by a health care professional in a hospital or clinic setting. Talk to your pediatrician regarding the use of this medicine in children. Special care may be needed. Overdosage: If you think you have taken too much of this medicine contact a poison control center or emergency room at once. NOTE: This medicine is only for you. Do not share this medicine with others. What if I miss a dose? It is important not to miss your dose. Call your doctor or health care professional if you are unable to keep an appointment. What may interact with this medicine? This medicine may interact with the following medications:  ketoconazole  rifampin  ritonavir  St. John's Wort This list may not describe all possible interactions. Give your health care provider a list of all the medicines, herbs, non-prescription drugs, or dietary supplements you use. Also tell them if you smoke, drink alcohol, or use illegal drugs. Some  items may interact with your medicine. What should I watch for while using this medicine? You may get drowsy or dizzy. Do not drive, use machinery, or do anything that needs mental alertness until you know how this medicine affects you. Do not stand or sit up quickly, especially if you are an older patient. This reduces the risk of dizzy or fainting spells. In some cases, you may be given additional medicines to help with side effects. Follow all directions for their use. Call your doctor or health care professional for advice if you get a fever, chills or sore throat, or other symptoms of a cold or flu. Do not treat yourself. This drug decreases your body's ability to fight infections. Try to avoid being around people who are sick. This medicine may increase your risk to bruise or bleed. Call your doctor or health care professional if you notice any unusual bleeding. You may need blood work done while you are taking this medicine. In some patients, this medicine may cause a serious brain infection that may cause death. If you have any problems seeing, thinking, speaking, walking, or standing, tell your doctor right away. If you cannot reach your doctor, urgently seek other source of medical care. Check with your doctor or health care professional if you get an attack of severe diarrhea, nausea and vomiting, or if you sweat a lot. The loss of too much body fluid can make it dangerous for you to take this medicine. Do not become pregnant while taking this medicine or for at least 7 months after stopping it. Women should inform their doctor   if they wish to become pregnant or think they might be pregnant. Men should not father a child while taking this medicine and for at least 4 months after stopping it. There is a potential for serious side effects to an unborn child. Talk to your health care professional or pharmacist for more information. Do not breast-feed an infant while taking this medicine or for 2  months after stopping it. This medicine may interfere with the ability to have a child. You should talk with your doctor or health care professional if you are concerned about your fertility. What side effects may I notice from receiving this medicine? Side effects that you should report to your doctor or health care professional as soon as possible:  allergic reactions like skin rash, itching or hives, swelling of the face, lips, or tongue  breathing problems  changes in hearing  changes in vision  fast, irregular heartbeat  feeling faint or lightheaded, falls  pain, tingling, numbness in the hands or feet  right upper belly pain  seizures  swelling of the ankles, feet, hands  unusual bleeding or bruising  unusually weak or tired  vomiting  yellowing of the eyes or skin Side effects that usually do not require medical attention (report to your doctor or health care professional if they continue or are bothersome):  changes in emotions or moods  constipation  diarrhea  loss of appetite  headache  irritation at site where injected  nausea This list may not describe all possible side effects. Call your doctor for medical advice about side effects. You may report side effects to FDA at 1-800-FDA-1088. Where should I keep my medicine? This drug is given in a hospital or clinic and will not be stored at home. NOTE: This sheet is a summary. It may not cover all possible information. If you have questions about this medicine, talk to your doctor, pharmacist, or health care provider.  2020 Elsevier/Gold Standard (2017-11-17 16:29:31)  

## 2019-02-25 NOTE — Progress Notes (Signed)
Hematology and Oncology Follow Up Visit  Debra Ruiz [id number] 1946-04-15 72 y.o. 02/25/2019   Principle Diagnosis:  IgG Kappa myeloma - compression fractures - 13q-, +11, 14q+ - hyperdiploid -- progressive Iron def anemia  Completed Therapy: S/p Kyphoplasty of T11 and T12 XRT to lower spine-completed on 09/25/2016  Current Therapy:   Velcade/Pomalyst/Decadron -Start on 09/02/2018 --Pomalyst on hold since 11/09/2018. Xgeva '120mg'$  Sq q  3 month - next dose in October 2020 IV Injectafer -- last dose given on 11/2018   Interim History:  Debra Ruiz is here today for follow-up.  Debra Ruiz is doing okay.  Debra Ruiz main problem however is Debra Ruiz neck.  Debra Ruiz is worried about Debra Ruiz neck.  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  Debra Ruiz has spinal stenosis.  Debra Ruiz says that Debra Ruiz is not walking as steadily.  Debra Ruiz is worried about having a neck surgery and then dealing with this myeloma.  I told Debra Ruiz that Debra Ruiz will be okay if Debra Ruiz needed surgery.  Debra Ruiz myeloma is under incredibly good control.  As far as Debra Ruiz myeloma is concerned, there is no monoclonal spike noted in Debra Ruiz blood last time we saw Debra Ruiz.  Debra Ruiz is doing quite well just on single agent Velcade.  We had pomalidomide.  Debra Ruiz had a really hard time with pomalidomide.  Debra Ruiz is having no problems with bowels or bladder.  Debra Ruiz is having no weakness in Debra Ruiz arms or hands.    Overall, Debra Ruiz performance status is ECOG 1.    Medications:  Allergies as of 02/25/2019      Reactions   Crestor [rosuvastatin Calcium]    Causes liver functions to be elevated      Medication List       Accurate as of February 25, 2019  1:38 PM. If you have any questions, ask your nurse or doctor.        ALPRAZolam 0.25 MG tablet Commonly known as: XANAX Take 1 tablet (0.25 mg total) by mouth 2 (two) times daily as needed. for anxiety   amLODipine 5 MG tablet Commonly known as: NORVASC TAKE 1 TABLET(5 MG) BY MOUTH DAILY   aspirin 81 MG EC tablet Take 81 mg by mouth daily.   atorvastatin 20  MG tablet Commonly known as: LIPITOR TAKE 1 TABLET(20 MG) BY MOUTH DAILY   buPROPion 300 MG 24 hr tablet Commonly known as: Wellbutrin XL Take 1 tablet (300 mg total) by mouth daily.   Bystolic 5 MG tablet Generic drug: nebivolol TAKE 1 TABLET(5 MG) BY MOUTH DAILY   dexamethasone 4 MG tablet Commonly known as: DECADRON TAKE 3 TABLETS BY MOUTH ONCE A WEEK   escitalopram 20 MG tablet Commonly known as: LEXAPRO TAKE 1 TABLET(20 MG) BY MOUTH DAILY   famciclovir 250 MG tablet Commonly known as: FAMVIR TAKE 1 TABLET(250 MG) BY MOUTH DAILY   Fusion Plus Caps Take one capsule daily   pomalidomide 3 MG capsule Commonly known as: POMALYST Take with water on days 1-14 and off days 15-28.Marland Kitchen Repeat every 28 days. [id number]   potassium chloride SA 20 MEQ tablet Commonly known as: K-DUR TAKE 1 TABLET(20 MEQ) BY MOUTH DAILY   prochlorperazine 10 MG tablet Commonly known as: COMPAZINE TAKE 1 TABLET(10 MG) BY MOUTH EVERY 6 HOURS AS NEEDED FOR NAUSEA OR VOMITING   Vitamin D (Ergocalciferol) 50 MCG (2000 UT) Caps Take 2,000 Units by mouth daily.   Vitamin D 50 MCG (2000 UT) Caps Take 2,000 Units by mouth daily.  Allergies:  Allergies  Allergen Reactions  . Crestor [Rosuvastatin Calcium]     Causes liver functions to be elevated    Past Medical History, Surgical history, Social history, and Family History were reviewed and updated.  Review of Systems: Review of Systems  Constitutional: Negative.   HENT: Negative.   Eyes: Negative.   Respiratory: Negative.   Cardiovascular: Negative.   Gastrointestinal: Negative.   Genitourinary: Negative.   Musculoskeletal: Positive for back pain.  Skin: Negative.   Neurological: Negative.   Endo/Heme/Allergies: Negative.   Psychiatric/Behavioral: Negative.     Physical Exam:  weight is 114 lb 6.4 oz (51.9 kg). Debra Ruiz oral temperature is 97.3 F (36.3 C) (abnormal). Debra Ruiz blood pressure is 130/73 and Debra Ruiz pulse is 93. Debra Ruiz  respiration is 20 and oxygen saturation is 100%.   Wt Readings from Last 3 Encounters:  02/25/19 114 lb 6.4 oz (51.9 kg)  01/27/19 115 lb (52.2 kg)  12/30/18 115 lb (52.2 kg)    Physical Exam Vitals signs reviewed.  HENT:     Head: Normocephalic and atraumatic.  Eyes:     Pupils: Pupils are equal, round, and reactive to light.  Neck:     Musculoskeletal: Normal range of motion.  Cardiovascular:     Rate and Rhythm: Normal rate and regular rhythm.     Heart sounds: Normal heart sounds.  Pulmonary:     Effort: Pulmonary effort is normal.     Breath sounds: Normal breath sounds.  Abdominal:     General: Bowel sounds are normal.     Palpations: Abdomen is soft.  Musculoskeletal: Normal range of motion.        General: No tenderness or deformity.  Lymphadenopathy:     Cervical: No cervical adenopathy.  Skin:    General: Skin is warm and dry.     Findings: No erythema or rash.  Neurological:     Mental Status: Debra Ruiz is alert and oriented to person, place, and time.  Psychiatric:        Behavior: Behavior normal.        Thought Content: Thought content normal.        Judgment: Judgment normal.       Lab Results  Component Value Date   WBC 7.7 02/25/2019   HGB 15.5 (H) 02/25/2019   HCT 47.0 (H) 02/25/2019   MCV 98.3 02/25/2019   PLT 247 02/25/2019   Lab Results  Component Value Date   FERRITIN 486 (H) 01/27/2019   IRON 130 01/27/2019   TIBC 294 01/27/2019   UIBC 164 01/27/2019   IRONPCTSAT 44 01/27/2019   Lab Results  Component Value Date   RBC 4.78 02/25/2019   Lab Results  Component Value Date   KPAFRELGTCHN 4.6 01/27/2019   LAMBDASER 5.4 (L) 01/27/2019   KAPLAMBRATIO 0.85 01/27/2019   Lab Results  Component Value Date   IGGSERUM 237 (L) 01/27/2019   IGGSERUM 272 (L) 01/27/2019   IGA 30 (L) 01/27/2019   IGA 33 (L) 01/27/2019   IGMSERUM 31 01/27/2019   IGMSERUM 35 01/27/2019   Lab Results  Component Value Date   TOTALPROTELP 5.8 (L) 01/27/2019    ALBUMINELP 3.9 01/27/2019   A1GS 0.2 01/27/2019   A2GS 0.8 01/27/2019   BETS 0.7 01/27/2019   GAMS 0.2 (L) 01/27/2019   MSPIKE Not Observed 01/27/2019   SPEI Comment 01/28/2018     Chemistry      Component Value Date/Time   NA 143 02/25/2019 1140   NA 148 (H)  07/16/2017 1128   NA 143 03/05/2017 1051   K 3.8 02/25/2019 1140   K 3.8 07/16/2017 1128   K 3.7 03/05/2017 1051   CL 104 02/25/2019 1140   CL 106 07/16/2017 1128   CO2 30 02/25/2019 1140   CO2 29 07/16/2017 1128   CO2 25 03/05/2017 1051   BUN 14 02/25/2019 1140   BUN 23 (H) 07/16/2017 1128   BUN 18.2 03/05/2017 1051   CREATININE 0.70 02/25/2019 1140   CREATININE 0.69 05/07/2018 1233   CREATININE 0.8 03/05/2017 1051      Component Value Date/Time   CALCIUM 8.7 (L) 02/25/2019 1140   CALCIUM 9.7 07/16/2017 1128   CALCIUM 8.9 03/05/2017 1051   ALKPHOS 64 02/25/2019 1140   ALKPHOS 71 07/16/2017 1128   ALKPHOS 67 03/05/2017 1051   AST 17 02/25/2019 1140   AST 15 03/05/2017 1051   ALT 18 02/25/2019 1140   ALT 23 07/16/2017 1128   ALT 17 03/05/2017 1051   BILITOT 0.5 02/25/2019 1140   BILITOT 0.55 03/05/2017 1051         Impression and Plan: Debra Ruiz is a very pleasant 73 yo caucasian female with IgG Kappa Myeloma.    Debra Ruiz quality of life right now is to be dictated by the neck issues.  Again I do not see any problem with Debra Ruiz having spinal surgery with myeloma.  We can adjust Debra Ruiz treatment schedule to accommodate surgery.  We will see Debra Ruiz back in another 4 weeks.  I just feel bad that Debra Ruiz and Debra Ruiz husband have not been able to go to the beach.  Typically they go to the beach all of August and it because of the coronavirus he cannot do that this year.    [name], MD 8/6/20201:38 PM

## 2019-02-26 LAB — IRON AND TIBC
Iron: 151 ug/dL — ABNORMAL HIGH (ref 41–142)
Saturation Ratios: 45 % (ref 21–57)
TIBC: 339 ug/dL (ref 236–444)
UIBC: 187 ug/dL (ref 120–384)

## 2019-02-26 LAB — KAPPA/LAMBDA LIGHT CHAINS
Kappa free light chain: 5.4 mg/L (ref 3.3–19.4)
Kappa, lambda light chain ratio: 1.74 — ABNORMAL HIGH (ref 0.26–1.65)
Lambda free light chains: 3.1 mg/L — ABNORMAL LOW (ref 5.7–26.3)

## 2019-02-26 LAB — IGG, IGA, IGM
IgA: 34 mg/dL — ABNORMAL LOW (ref 64–422)
IgG (Immunoglobin G), Serum: 316 mg/dL — ABNORMAL LOW (ref 586–1602)
IgM (Immunoglobulin M), Srm: 28 mg/dL (ref 26–217)

## 2019-02-26 LAB — FERRITIN: Ferritin: 365 ng/mL — ABNORMAL HIGH (ref 11–307)

## 2019-03-02 LAB — PROTEIN ELECTROPHORESIS, SERUM, WITH REFLEX
A/G Ratio: 1.7 (ref 0.7–1.7)
Albumin ELP: 4 g/dL (ref 2.9–4.4)
Alpha-1-Globulin: 0.3 g/dL (ref 0.0–0.4)
Alpha-2-Globulin: 0.8 g/dL (ref 0.4–1.0)
Beta Globulin: 0.9 g/dL (ref 0.7–1.3)
Gamma Globulin: 0.2 g/dL — ABNORMAL LOW (ref 0.4–1.8)
Globulin, Total: 2.3 g/dL (ref 2.2–3.9)
M-Spike, %: 0.1 g/dL — ABNORMAL HIGH
SPEP Interpretation: 0
Total Protein ELP: 6.3 g/dL (ref 6.0–8.5)

## 2019-03-02 LAB — IMMUNOFIXATION REFLEX, SERUM
IgA: 36 mg/dL — ABNORMAL LOW (ref 64–422)
IgG (Immunoglobin G), Serum: 327 mg/dL — ABNORMAL LOW (ref 586–1602)
IgM (Immunoglobulin M), Srm: 33 mg/dL (ref 26–217)

## 2019-03-08 ENCOUNTER — Other Ambulatory Visit: Payer: Self-pay | Admitting: Internal Medicine

## 2019-03-08 DIAGNOSIS — G959 Disease of spinal cord, unspecified: Secondary | ICD-10-CM | POA: Diagnosis not present

## 2019-03-08 MED ORDER — ALPRAZOLAM 0.25 MG PO TABS: 0.2500 mg | ORAL_TABLET | Freq: Two times a day (BID) | ORAL | 5 refills | Status: DC | PRN

## 2019-03-08 NOTE — Telephone Encounter (Signed)
Received Fax RX request from  [facility]  Medication - ALPRAZolam (XANAX) 0.25 MG tablet [[id number]]   Last Refill - 11/24/18  Last OV - 09/10/18  Last CPE - 05/12/2018  Next CPE scheduled for 05/18/19

## 2019-03-09 DIAGNOSIS — H2511 Age-related nuclear cataract, right eye: Secondary | ICD-10-CM | POA: Diagnosis not present

## 2019-03-09 DIAGNOSIS — H25811 Combined forms of age-related cataract, right eye: Secondary | ICD-10-CM | POA: Diagnosis not present

## 2019-03-10 DIAGNOSIS — H2512 Age-related nuclear cataract, left eye: Secondary | ICD-10-CM | POA: Diagnosis not present

## 2019-03-10 DIAGNOSIS — H25042 Posterior subcapsular polar age-related cataract, left eye: Secondary | ICD-10-CM | POA: Diagnosis not present

## 2019-03-11 ENCOUNTER — Other Ambulatory Visit: Payer: Self-pay | Admitting: Orthopedic Surgery

## 2019-03-11 DIAGNOSIS — M542 Cervicalgia: Secondary | ICD-10-CM

## 2019-03-15 ENCOUNTER — Telehealth: Payer: Self-pay | Admitting: *Deleted

## 2019-03-15 NOTE — Telephone Encounter (Signed)
Call placed to Dr. Laurena Bering office per order of Dr. Marin Olp to check on pt.'s upcoming surgery date.  Per Dr. Laurena Bering office, pt.'s surgery will be scheduled on 04/22/19 or 04/29/19.  Dr. Marin Olp notified.

## 2019-03-16 ENCOUNTER — Other Ambulatory Visit: Payer: Self-pay | Admitting: Orthopedic Surgery

## 2019-03-16 DIAGNOSIS — H2512 Age-related nuclear cataract, left eye: Secondary | ICD-10-CM | POA: Diagnosis not present

## 2019-03-16 DIAGNOSIS — H25812 Combined forms of age-related cataract, left eye: Secondary | ICD-10-CM | POA: Diagnosis not present

## 2019-03-22 ENCOUNTER — Other Ambulatory Visit: Payer: PPO

## 2019-03-22 ENCOUNTER — Ambulatory Visit
Admission: RE | Admit: 2019-03-22 | Discharge: 2019-03-22 | Disposition: A | Discharge status: Home / Self Care | Payer: PPO | Source: Ambulatory Visit | Attending: Orthopedic Surgery | Admitting: Orthopedic Surgery

## 2019-03-22 ENCOUNTER — Other Ambulatory Visit: Payer: Self-pay

## 2019-03-22 DIAGNOSIS — M4712 Other spondylosis with myelopathy, cervical region: Secondary | ICD-10-CM | POA: Diagnosis not present

## 2019-03-22 DIAGNOSIS — M4802 Spinal stenosis, cervical region: Secondary | ICD-10-CM | POA: Diagnosis not present

## 2019-03-22 DIAGNOSIS — M50321 Other cervical disc degeneration at C4-C5 level: Secondary | ICD-10-CM | POA: Diagnosis not present

## 2019-03-22 DIAGNOSIS — M542 Cervicalgia: Secondary | ICD-10-CM

## 2019-03-24 ENCOUNTER — Inpatient Hospital Stay: Payer: PPO

## 2019-03-24 ENCOUNTER — Inpatient Hospital Stay: Payer: PPO | Attending: Hematology & Oncology

## 2019-03-24 ENCOUNTER — Other Ambulatory Visit: Payer: Self-pay

## 2019-03-24 ENCOUNTER — Ambulatory Visit: Payer: PPO | Admitting: Hematology & Oncology

## 2019-03-24 VITALS — BP 134/59 | HR 90 | Temp 98.1°F | Resp 20

## 2019-03-24 DIAGNOSIS — C9000 Multiple myeloma not having achieved remission: Secondary | ICD-10-CM

## 2019-03-24 DIAGNOSIS — Z5112 Encounter for antineoplastic immunotherapy: Secondary | ICD-10-CM | POA: Insufficient documentation

## 2019-03-24 DIAGNOSIS — D509 Iron deficiency anemia, unspecified: Secondary | ICD-10-CM | POA: Insufficient documentation

## 2019-03-24 LAB — CBC WITH DIFFERENTIAL (CANCER CENTER ONLY)
Abs Immature Granulocytes: 0.05 10*3/uL (ref 0.00–0.07)
Basophils Absolute: 0 10*3/uL (ref 0.0–0.1)
Basophils Relative: 0 %
Eosinophils Absolute: 0.1 10*3/uL (ref 0.0–0.5)
Eosinophils Relative: 1 %
HCT: 39.6 % (ref 36.0–46.0)
Hemoglobin: 13.5 g/dL (ref 12.0–15.0)
Immature Granulocytes: 1 %
Lymphocytes Relative: 18 %
Lymphs Abs: 1.2 10*3/uL (ref 0.7–4.0)
MCH: 33.2 pg (ref 26.0–34.0)
MCHC: 34.1 g/dL (ref 30.0–36.0)
MCV: 97.3 fL (ref 80.0–100.0)
Monocytes Absolute: 0.7 10*3/uL (ref 0.1–1.0)
Monocytes Relative: 10 %
Neutro Abs: 4.5 10*3/uL (ref 1.7–7.7)
Neutrophils Relative %: 70 %
Platelet Count: 213 10*3/uL (ref 150–400)
RBC: 4.07 MIL/uL (ref 3.87–5.11)
RDW: 13.2 % (ref 11.5–15.5)
WBC Count: 6.5 10*3/uL (ref 4.0–10.5)
nRBC: 0 % (ref 0.0–0.2)

## 2019-03-24 LAB — CMP (CANCER CENTER ONLY)
ALT: 16 U/L (ref 0–44)
AST: 16 U/L (ref 15–41)
Albumin: 4.1 g/dL (ref 3.5–5.0)
Alkaline Phosphatase: 55 U/L (ref 38–126)
Anion gap: 8 (ref 5–15)
BUN: 15 mg/dL (ref 8–23)
CO2: 27 mmol/L (ref 22–32)
Calcium: 9.1 mg/dL (ref 8.9–10.3)
Chloride: 106 mmol/L (ref 98–111)
Creatinine: 0.75 mg/dL (ref 0.44–1.00)
GFR, Est AFR Am: >60 mL/min (ref >60)
GFR, Estimated: >60 mL/min (ref >60)
Glucose, Bld: 157 mg/dL — ABNORMAL HIGH (ref 70–99)
Potassium: 3.6 mmol/L (ref 3.5–5.1)
Sodium: 141 mmol/L (ref 135–145)
Total Bilirubin: 0.6 mg/dL (ref 0.3–1.2)
Total Protein: 5.8 g/dL — ABNORMAL LOW (ref 6.5–8.1)

## 2019-03-24 MED ORDER — PROCHLORPERAZINE MALEATE 10 MG PO TABS: 10.0000 mg | ORAL_TABLET | Freq: Once | ORAL | Status: DC

## 2019-03-24 MED ORDER — BORTEZOMIB CHEMO SQ INJECTION 3.5 MG (2.5MG/ML)
1.3000 mg/m2 | Freq: Once | INTRAMUSCULAR | Status: AC
  Administered 2019-03-24: 2 mg via SUBCUTANEOUS
  Filled 2019-03-24: qty 0.8

## 2019-03-24 NOTE — Patient Instructions (Signed)

## 2019-04-01 ENCOUNTER — Other Ambulatory Visit: Payer: Self-pay

## 2019-04-01 MED ORDER — BUPROPION HCL ER (XL) 300 MG PO TB24: 300.0000 mg | ORAL_TABLET | Freq: Every day | ORAL | 1 refills | Status: DC

## 2019-04-19 DIAGNOSIS — Z9889 Other specified postprocedural states: Secondary | ICD-10-CM | POA: Diagnosis not present

## 2019-04-19 DIAGNOSIS — H04123 Dry eye syndrome of bilateral lacrimal glands: Secondary | ICD-10-CM | POA: Diagnosis not present

## 2019-04-19 DIAGNOSIS — Z961 Presence of intraocular lens: Secondary | ICD-10-CM | POA: Diagnosis not present

## 2019-04-20 ENCOUNTER — Other Ambulatory Visit: Payer: Self-pay | Admitting: *Deleted

## 2019-04-20 DIAGNOSIS — C9000 Multiple myeloma not having achieved remission: Secondary | ICD-10-CM

## 2019-04-21 ENCOUNTER — Inpatient Hospital Stay (HOSPITAL_BASED_OUTPATIENT_CLINIC_OR_DEPARTMENT_OTHER): Payer: PPO | Admitting: Hematology & Oncology

## 2019-04-21 ENCOUNTER — Encounter: Payer: Self-pay | Admitting: Hematology & Oncology

## 2019-04-21 ENCOUNTER — Other Ambulatory Visit: Payer: Self-pay

## 2019-04-21 ENCOUNTER — Inpatient Hospital Stay: Payer: PPO

## 2019-04-21 VITALS — BP 149/62 | HR 91 | Temp 97.3°F | Resp 20 | Wt 116.0 lb

## 2019-04-21 DIAGNOSIS — C9000 Multiple myeloma not having achieved remission: Secondary | ICD-10-CM

## 2019-04-21 DIAGNOSIS — Z5112 Encounter for antineoplastic immunotherapy: Secondary | ICD-10-CM | POA: Diagnosis not present

## 2019-04-21 LAB — CMP (CANCER CENTER ONLY)
ALT: 15 U/L (ref 0–44)
AST: 14 U/L — ABNORMAL LOW (ref 15–41)
Albumin: 4.4 g/dL (ref 3.5–5.0)
Alkaline Phosphatase: 79 U/L (ref 38–126)
Anion gap: 8 (ref 5–15)
BUN: 18 mg/dL (ref 8–23)
CO2: 28 mmol/L (ref 22–32)
Calcium: 9.2 mg/dL (ref 8.9–10.3)
Chloride: 105 mmol/L (ref 98–111)
Creatinine: 0.67 mg/dL (ref 0.44–1.00)
GFR, Est AFR Am: >60 mL/min (ref >60)
GFR, Estimated: >60 mL/min (ref >60)
Glucose, Bld: 128 mg/dL — ABNORMAL HIGH (ref 70–99)
Potassium: 3.7 mmol/L (ref 3.5–5.1)
Sodium: 141 mmol/L (ref 135–145)
Total Bilirubin: 0.5 mg/dL (ref 0.3–1.2)
Total Protein: 6.3 g/dL — ABNORMAL LOW (ref 6.5–8.1)

## 2019-04-21 LAB — CBC WITH DIFFERENTIAL (CANCER CENTER ONLY)
Abs Immature Granulocytes: 0.11 10*3/uL — ABNORMAL HIGH (ref 0.00–0.07)
Basophils Absolute: 0 10*3/uL (ref 0.0–0.1)
Basophils Relative: 0 %
Eosinophils Absolute: 0.1 10*3/uL (ref 0.0–0.5)
Eosinophils Relative: 1 %
HCT: 39.3 % (ref 36.0–46.0)
Hemoglobin: 13.2 g/dL (ref 12.0–15.0)
Immature Granulocytes: 1 %
Lymphocytes Relative: 16 %
Lymphs Abs: 1.5 10*3/uL (ref 0.7–4.0)
MCH: 33.2 pg (ref 26.0–34.0)
MCHC: 33.6 g/dL (ref 30.0–36.0)
MCV: 99 fL (ref 80.0–100.0)
Monocytes Absolute: 0.6 10*3/uL (ref 0.1–1.0)
Monocytes Relative: 7 %
Neutro Abs: 7.1 10*3/uL (ref 1.7–7.7)
Neutrophils Relative %: 75 %
Platelet Count: 238 10*3/uL (ref 150–400)
RBC: 3.97 MIL/uL (ref 3.87–5.11)
RDW: 12.4 % (ref 11.5–15.5)
WBC Count: 9.3 10*3/uL (ref 4.0–10.5)
nRBC: 0 % (ref 0.0–0.2)

## 2019-04-21 MED ORDER — PROCHLORPERAZINE MALEATE 10 MG PO TABS
ORAL_TABLET | ORAL | Status: AC
  Filled 2019-04-21: qty 1

## 2019-04-21 MED ORDER — PROCHLORPERAZINE MALEATE 10 MG PO TABS: 10.0000 mg | ORAL_TABLET | Freq: Once | ORAL | Status: DC

## 2019-04-21 MED ORDER — BORTEZOMIB CHEMO SQ INJECTION 3.5 MG (2.5MG/ML)
1.3000 mg/m2 | Freq: Once | INTRAMUSCULAR | Status: AC
  Administered 2019-04-21: 13:00:00 2 mg via SUBCUTANEOUS
  Filled 2019-04-21: qty 0.8

## 2019-04-21 NOTE — Patient Instructions (Signed)
[facility] Discharge Instructions for Patients Receiving Chemotherapy  Today you received the following chemotherapy agents Velcade To help prevent nausea and vomiting after your treatment, we encourage you to take your nausea medication as prescribed.   If you develop nausea and vomiting that is not controlled by your nausea medication, call the clinic.   BELOW ARE SYMPTOMS THAT SHOULD BE REPORTED IMMEDIATELY:  *FEVER GREATER THAN 100.5 F  *CHILLS WITH OR WITHOUT FEVER  NAUSEA AND VOMITING THAT IS NOT CONTROLLED WITH YOUR NAUSEA MEDICATION  *UNUSUAL SHORTNESS OF BREATH  *UNUSUAL BRUISING OR BLEEDING  TENDERNESS IN MOUTH AND THROAT WITH OR WITHOUT PRESENCE OF ULCERS  *URINARY PROBLEMS  *BOWEL PROBLEMS  UNUSUAL RASH Items with * indicate a potential emergency and should be followed up as soon as possible.  Feel free to call the clinic should you have any questions or concerns. The clinic phone number is [phone].  Please show the CHEMO ALERT CARD at check-in to the Emergency Department and triage nurse.   

## 2019-04-21 NOTE — Progress Notes (Signed)
Hematology and Oncology Follow Up Visit  Debra Ruiz [id number] 1945-08-05 72 y.o. 04/21/2019   Principle Diagnosis:  IgG Kappa myeloma - compression fractures - 13q-, +11, 14q+ - hyperdiploid -- progressive Iron def anemia  Completed Therapy: S/p Kyphoplasty of T11 and T12 XRT to lower spine-completed on 09/25/2016  Current Therapy:   Velcade/Pomalyst/Decadron -Start on 09/02/2018 --Pomalyst on hold since 11/09/2018. Xgeva '120mg'$  Sq q  3 month - next dose in November 2020 IV Injectafer -- last dose given on 11/2018   Interim History:  Debra Ruiz is here today for follow-up.  Debra Ruiz is doing okay.  The big news is that Debra Ruiz is going to have her cervical spine surgery next Thursday-October 8.  Debra Ruiz has try to put this off as long as possible.  Debra Ruiz did definitely will need this.  Debra Ruiz will be in the hospital overnight.  Otherwise, Debra Ruiz is feeling pretty well.  Debra Ruiz has had no problems with respect to the Velcade.  Her last M spike was 0.1 g/dL.  Debra Ruiz has had no problems with nausea or vomiting.  There is been no pain, so that with her neck.  Debra Ruiz has had no cough.  There is been no change in bowel or bladder habits.  Debra Ruiz has had no leg swelling.  Overall, her performance status is ECOG 1.     Medications:  Allergies as of 04/21/2019      Reactions   Crestor [rosuvastatin Calcium]    Causes liver functions to be elevated      Medication List       Accurate as of April 21, 2019 12:34 PM. If you have any questions, ask your nurse or doctor.        ALPRAZolam 0.25 MG tablet Commonly known as: XANAX Take 1 tablet (0.25 mg total) by mouth 2 (two) times daily as needed. for anxiety   amLODipine 5 MG tablet Commonly known as: NORVASC TAKE 1 TABLET(5 MG) BY MOUTH DAILY What changed: See the new instructions.   aspirin 81 MG EC tablet Take 81 mg by mouth daily.   atorvastatin 20 MG tablet Commonly known as: LIPITOR TAKE 1 TABLET(20 MG) BY MOUTH DAILY What changed: See the new  instructions.   buPROPion 300 MG 24 hr tablet Commonly known as: Wellbutrin XL Take 1 tablet (300 mg total) by mouth daily.   dexamethasone 4 MG tablet Commonly known as: DECADRON TAKE 3 TABLETS BY MOUTH ONCE A WEEK   escitalopram 20 MG tablet Commonly known as: LEXAPRO TAKE 1 TABLET(20 MG) BY MOUTH DAILY   famciclovir 250 MG tablet Commonly known as: FAMVIR TAKE 1 TABLET(250 MG) BY MOUTH DAILY What changed: See the new instructions.   ferrous sulfate 325 (65 FE) MG tablet Take 650 mg by mouth daily with breakfast.   potassium chloride SA 20 MEQ tablet Commonly known as: KLOR-CON TAKE 1 TABLET(20 MEQ) BY MOUTH DAILY What changed: See the new instructions.   prochlorperazine 10 MG tablet Commonly known as: COMPAZINE TAKE 1 TABLET(10 MG) BY MOUTH EVERY 6 HOURS AS NEEDED FOR NAUSEA OR VOMITING What changed: See the new instructions.   Vitamin D 50 MCG (2000 UT) Caps Take 2,000 Units by mouth daily.       Allergies:  Allergies  Allergen Reactions  . Crestor [Rosuvastatin Calcium]     Causes liver functions to be elevated    Past Medical History, Surgical history, Social history, and Family History were reviewed and updated.  Review of Systems: Review of Systems  Constitutional: Negative.   HENT: Negative.   Eyes: Negative.   Respiratory: Negative.   Cardiovascular: Negative.   Gastrointestinal: Negative.   Genitourinary: Negative.   Musculoskeletal: Positive for back pain.  Skin: Negative.   Neurological: Negative.   Endo/Heme/Allergies: Negative.   Psychiatric/Behavioral: Negative.     Physical Exam:  weight is 116 lb (52.6 kg). Her oral temperature is 97.3 F (36.3 C) (abnormal). Her blood pressure is 149/62 (abnormal) and her pulse is 91. Her respiration is 20 and oxygen saturation is 100%.   Wt Readings from Last 3 Encounters:  04/21/19 116 lb (52.6 kg)  02/25/19 114 lb 6.4 oz (51.9 kg)  01/27/19 115 lb (52.2 kg)    Physical Exam Vitals signs  reviewed.  HENT:     Head: Normocephalic and atraumatic.  Eyes:     Pupils: Pupils are equal, round, and reactive to light.  Neck:     Musculoskeletal: Normal range of motion.  Cardiovascular:     Rate and Rhythm: Normal rate and regular rhythm.     Heart sounds: Normal heart sounds.  Pulmonary:     Effort: Pulmonary effort is normal.     Breath sounds: Normal breath sounds.  Abdominal:     General: Bowel sounds are normal.     Palpations: Abdomen is soft.  Musculoskeletal: Normal range of motion.        General: No tenderness or deformity.  Lymphadenopathy:     Cervical: No cervical adenopathy.  Skin:    General: Skin is warm and dry.     Findings: No erythema or rash.  Neurological:     Mental Status: Debra Ruiz is alert and oriented to person, place, and time.  Psychiatric:        Behavior: Behavior normal.        Thought Content: Thought content normal.        Judgment: Judgment normal.       Lab Results  Component Value Date   WBC 9.3 04/21/2019   HGB 13.2 04/21/2019   HCT 39.3 04/21/2019   MCV 99.0 04/21/2019   PLT 238 04/21/2019   Lab Results  Component Value Date   FERRITIN 365 (H) 02/25/2019   IRON 151 (H) 02/25/2019   TIBC 339 02/25/2019   UIBC 187 02/25/2019   IRONPCTSAT 45 02/25/2019   Lab Results  Component Value Date   RBC 3.97 04/21/2019   Lab Results  Component Value Date   KPAFRELGTCHN 5.4 02/25/2019   LAMBDASER 3.1 (L) 02/25/2019   KAPLAMBRATIO 1.74 (H) 02/25/2019   Lab Results  Component Value Date   IGGSERUM 316 (L) 02/25/2019   IGGSERUM 327 (L) 02/25/2019   IGA 34 (L) 02/25/2019   IGA 36 (L) 02/25/2019   IGMSERUM 28 02/25/2019   IGMSERUM 33 02/25/2019   Lab Results  Component Value Date   TOTALPROTELP 6.3 02/25/2019   ALBUMINELP 4.0 02/25/2019   A1GS 0.3 02/25/2019   A2GS 0.8 02/25/2019   BETS 0.9 02/25/2019   GAMS 0.2 (L) 02/25/2019   MSPIKE 0.1 (H) 02/25/2019   SPEI Comment 01/28/2018     Chemistry      Component Value  Date/Time   NA 141 04/21/2019 1113   NA 148 (H) 07/16/2017 1128   NA 143 03/05/2017 1051   K 3.7 04/21/2019 1113   K 3.8 07/16/2017 1128   K 3.7 03/05/2017 1051   CL 105 04/21/2019 1113   CL 106 07/16/2017 1128   CO2 28 04/21/2019 1113   CO2 29  07/16/2017 1128   CO2 25 03/05/2017 1051   BUN 18 04/21/2019 1113   BUN 23 (H) 07/16/2017 1128   BUN 18.2 03/05/2017 1051   CREATININE 0.67 04/21/2019 1113   CREATININE 0.69 05/07/2018 1233   CREATININE 0.8 03/05/2017 1051      Component Value Date/Time   CALCIUM 9.2 04/21/2019 1113   CALCIUM 9.7 07/16/2017 1128   CALCIUM 8.9 03/05/2017 1051   ALKPHOS 79 04/21/2019 1113   ALKPHOS 71 07/16/2017 1128   ALKPHOS 67 03/05/2017 1051   AST 14 (L) 04/21/2019 1113   AST 15 03/05/2017 1051   ALT 15 04/21/2019 1113   ALT 23 07/16/2017 1128   ALT 17 03/05/2017 1051   BILITOT 0.5 04/21/2019 1113   BILITOT 0.55 03/05/2017 1051         Impression and Plan: Debra Ruiz is a very pleasant 73 yo caucasian female with IgG Kappa Myeloma.    Her quality of life right now is to be dictated by the neck issues.  Again I do not see any problem with her having spinal surgery with myeloma.  We will  adjust her treatment schedule to accommodate surgery.  We will see her back in another 6 weeks.  I will try to see her in the hospital when Debra Ruiz is there.  [name], MD 9/30/202012:34 PM

## 2019-04-22 ENCOUNTER — Other Ambulatory Visit: Payer: Self-pay | Admitting: Hematology & Oncology

## 2019-04-22 DIAGNOSIS — C9000 Multiple myeloma not having achieved remission: Secondary | ICD-10-CM

## 2019-04-22 LAB — KAPPA/LAMBDA LIGHT CHAINS
Kappa free light chain: 6.4 mg/L (ref 3.3–19.4)
Kappa, lambda light chain ratio: 1.6 (ref 0.26–1.65)
Lambda free light chains: 4 mg/L — ABNORMAL LOW (ref 5.7–26.3)

## 2019-04-22 LAB — PROTEIN ELECTROPHORESIS, SERUM, WITH REFLEX
A/G Ratio: 1.7 (ref 0.7–1.7)
Albumin ELP: 3.8 g/dL (ref 2.9–4.4)
Alpha-1-Globulin: 0.2 g/dL (ref 0.0–0.4)
Alpha-2-Globulin: 0.8 g/dL (ref 0.4–1.0)
Beta Globulin: 0.9 g/dL (ref 0.7–1.3)
Gamma Globulin: 0.3 g/dL — ABNORMAL LOW (ref 0.4–1.8)
Globulin, Total: 2.2 g/dL (ref 2.2–3.9)
Total Protein ELP: 6 g/dL (ref 6.0–8.5)

## 2019-04-22 LAB — IGG, IGA, IGM
IgA: 25 mg/dL — ABNORMAL LOW (ref 64–422)
IgG (Immunoglobin G), Serum: 294 mg/dL — ABNORMAL LOW (ref 586–1602)
IgM (Immunoglobulin M), Srm: 24 mg/dL — ABNORMAL LOW (ref 26–217)

## 2019-04-22 LAB — IRON AND TIBC
Iron: 181 ug/dL — ABNORMAL HIGH (ref 41–142)
Saturation Ratios: 64 % — ABNORMAL HIGH (ref 21–57)
TIBC: 285 ug/dL (ref 236–444)
UIBC: 104 ug/dL — ABNORMAL LOW (ref 120–384)

## 2019-04-22 LAB — FERRITIN: Ferritin: 219 ng/mL (ref 11–307)

## 2019-04-22 NOTE — Progress Notes (Signed)
[facility] [id number] [place], [place] AT [place] [address] [place] [address] Phone: [phone] Fax: [phone]  [place], [place] - [place] [address] Phone: [phone] Fax: [phone]      Your procedure is scheduled on April 29, 2019.  Report to [facility] Main Entrance "A" at 05:30 A.M., and check in at the Admitting office.  Call this number if you have problems the morning of surgery:  [phone]  Call [phone] if you have any questions prior to your surgery date Monday-Friday 8am-4pm   Remember:  Do not eat after midnight the night before your surgery  You may drink clear liquids until 04:30am the morning of your surgery.   Clear liquids allowed are: Water, Non-Citrus Juices (without pulp), Carbonated Beverages, Clear Tea, Black Coffee Only, and Gatorade   Enhanced Recovery after Surgery for Orthopedics Enhanced Recovery after Surgery is a protocol used to improve the stress on your body and your recovery after surgery.  Patient Instructions  . The night before surgery:  o No food after midnight. ONLY clear liquids after midnight  .  Marland Kitchen The day of surgery (if you do NOT have diabetes):  o Drink ONE (1) Pre-Surgery Clear Ensure as directed.   o This drink was given to you during your hospital  pre-op appointment visit. o The pre-op nurse will instruct you on the time to drink the  Pre-Surgery Ensure depending on your surgery time.  FINISH PRE-SURGERY ENSURE DRINK BY 04:30am. o Finish the drink at the designated time by the pre-op nurse.  o Nothing else to drink after completing the  Pre-Surgery Clear Ensure.    Take these medicines the morning of surgery with A SIP OF WATER : Alprazolam (Xanax) if needed Amlodipine (Norvasc) Atorvastatin (Lipitor) Bupropion (Wellbutrin XL) Escitalopram (Lexapro)  Famciclovir (Famivir) Prochlorperazine (Compazine) if needed  7 days prior to surgery STOP taking any Aspirin (unless otherwise instructed by your surgeon), Aleve, Naproxen, Ibuprofen, Motrin, Advil, Goody's, BC's, all herbal medications, fish oil, and all vitamins.   The Morning of Surgery  Do not wear jewelry, make-up or nail polish.  Do not wear lotions, powders, or perfumes/colognes, or deodorant  Do not shave 48 hours prior to surgery.    Do not bring valuables to the hospital.  [facility] is not responsible for any belongings or valuables.  If you are a smoker, DO NOT Smoke 24 hours prior to surgery IF you wear a CPAP at night please bring your mask, tubing, and machine the morning of surgery   Remember that you must have someone to transport you home after your surgery, and remain with you for 24 hours if you are discharged the same day.   Contacts, glasses, hearing aids, dentures or bridgework may not be worn into surgery.    Leave your suitcase in the car.  After surgery it may be brought to your room.  For patients admitted to the hospital, discharge time will be determined by your treatment team.  Patients discharged the day of surgery will not be allowed to drive home.    Special instructions:   [place]- Preparing For Surgery  Before surgery, you can play an important role. Because skin is not sterile, your skin needs to be as free of germs as possible. You can reduce the number of germs on your skin by washing  with CHG (chlorahexidine gluconate) Soap before surgery.  CHG is an antiseptic cleaner which kills germs and bonds with the skin to continue killing germs even after washing.    Oral Hygiene is also important to reduce your risk of infection.  Remember - BRUSH YOUR TEETH THE MORNING OF SURGERY WITH YOUR REGULAR TOOTHPASTE  Please do not use if you have an allergy to CHG or antibacterial soaps. If your skin becomes reddened/irritated stop using the CHG.  Do  not shave (including legs and underarms) for at least 48 hours prior to first CHG shower. It is OK to shave your face.  Please follow these instructions carefully.   1. Shower the NIGHT BEFORE SURGERY and the MORNING OF SURGERY with CHG Soap.   2. If you chose to wash your hair, wash your hair first as usual with your normal shampoo.  3. After you shampoo, rinse your hair and body thoroughly to remove the shampoo.  4. Use CHG as you would any other liquid soap. You can apply CHG directly to the skin and wash gently with a scrungie or a clean washcloth.   5. Apply the CHG Soap to your body ONLY FROM THE NECK DOWN.  Do not use on open wounds or open sores. Avoid contact with your eyes, ears, mouth and genitals (private parts). Wash Face and genitals (private parts)  with your normal soap.   6. Wash thoroughly, paying special attention to the area where your surgery will be performed.  7. Thoroughly rinse your body with warm water from the neck down.  8. DO NOT shower/wash with your normal soap after using and rinsing off the CHG Soap.  9. Pat yourself dry with a CLEAN TOWEL.  10. Wear CLEAN PAJAMAS to bed the night before surgery, wear comfortable clothes the morning of surgery  11. Place CLEAN SHEETS on your bed the night of your first shower and DO NOT SLEEP WITH PETS.    Day of Surgery:  Do not apply any deodorants/lotions. Please shower the morning of surgery with the CHG soap  Please wear clean clothes to the hospital/surgery center.   Remember to brush your teeth WITH YOUR REGULAR TOOTHPASTE.   Please read over the following fact sheets that you were given.

## 2019-04-23 ENCOUNTER — Other Ambulatory Visit: Payer: Self-pay

## 2019-04-23 ENCOUNTER — Encounter (HOSPITAL_COMMUNITY): Payer: Self-pay

## 2019-04-23 ENCOUNTER — Encounter (HOSPITAL_COMMUNITY)
Admission: RE | Admit: 2019-04-23 | Discharge: 2019-04-23 | Disposition: A | Discharge status: Home / Self Care | Payer: PPO | Source: Ambulatory Visit | Attending: Orthopedic Surgery | Admitting: Orthopedic Surgery

## 2019-04-23 DIAGNOSIS — Z7982 Long term (current) use of aspirin: Secondary | ICD-10-CM | POA: Diagnosis not present

## 2019-04-23 DIAGNOSIS — M5000 Cervical disc disorder with myelopathy, unspecified cervical region: Secondary | ICD-10-CM | POA: Insufficient documentation

## 2019-04-23 DIAGNOSIS — Z01818 Encounter for other preprocedural examination: Secondary | ICD-10-CM | POA: Insufficient documentation

## 2019-04-23 DIAGNOSIS — M5412 Radiculopathy, cervical region: Secondary | ICD-10-CM | POA: Diagnosis not present

## 2019-04-23 DIAGNOSIS — D509 Iron deficiency anemia, unspecified: Secondary | ICD-10-CM | POA: Diagnosis not present

## 2019-04-23 DIAGNOSIS — E785 Hyperlipidemia, unspecified: Secondary | ICD-10-CM | POA: Diagnosis not present

## 2019-04-23 DIAGNOSIS — C9000 Multiple myeloma not having achieved remission: Secondary | ICD-10-CM | POA: Diagnosis not present

## 2019-04-23 DIAGNOSIS — I10 Essential (primary) hypertension: Secondary | ICD-10-CM | POA: Diagnosis not present

## 2019-04-23 DIAGNOSIS — Z79899 Other long term (current) drug therapy: Secondary | ICD-10-CM | POA: Insufficient documentation

## 2019-04-23 DIAGNOSIS — Z87891 Personal history of nicotine dependence: Secondary | ICD-10-CM | POA: Insufficient documentation

## 2019-04-23 HISTORY — DX: Unspecified hearing loss, unspecified ear: H91.90

## 2019-04-23 HISTORY — DX: Unspecified cataract: H26.9

## 2019-04-23 HISTORY — DX: Unspecified malignant neoplasm of skin, unspecified: C44.90

## 2019-04-23 LAB — COMPREHENSIVE METABOLIC PANEL
ALT: 18 U/L (ref 0–44)
AST: 19 U/L (ref 15–41)
Albumin: 3.9 g/dL (ref 3.5–5.0)
Alkaline Phosphatase: 78 U/L (ref 38–126)
Anion gap: 9 (ref 5–15)
BUN: 9 mg/dL (ref 8–23)
CO2: 27 mmol/L (ref 22–32)
Calcium: 9 mg/dL (ref 8.9–10.3)
Chloride: 102 mmol/L (ref 98–111)
Creatinine, Ser: 0.76 mg/dL (ref 0.44–1.00)
GFR calc Af Amer: >60 mL/min (ref >60)
GFR calc non Af Amer: >60 mL/min (ref >60)
Glucose, Bld: 137 mg/dL — ABNORMAL HIGH (ref 70–99)
Potassium: 3.3 mmol/L — ABNORMAL LOW (ref 3.5–5.1)
Sodium: 138 mmol/L (ref 135–145)
Total Bilirubin: 0.6 mg/dL (ref 0.3–1.2)
Total Protein: 6.3 g/dL — ABNORMAL LOW (ref 6.5–8.1)

## 2019-04-23 LAB — URINALYSIS, ROUTINE W REFLEX MICROSCOPIC
Bilirubin Urine: NEGATIVE
Glucose, UA: NEGATIVE mg/dL
Hgb urine dipstick: NEGATIVE
Ketones, ur: NEGATIVE mg/dL
Nitrite: NEGATIVE
Protein, ur: NEGATIVE mg/dL
Specific Gravity, Urine: 1.016 (ref 1.005–1.030)
WBC, UA: >50 WBC/hpf — ABNORMAL HIGH (ref 0–5)
pH: 5 (ref 5.0–8.0)

## 2019-04-23 LAB — PROTIME-INR
INR: 1 (ref 0.8–1.2)
Prothrombin Time: 13.1 seconds (ref 11.4–15.2)

## 2019-04-23 LAB — CBC WITH DIFFERENTIAL/PLATELET
Abs Immature Granulocytes: 0.08 10*3/uL — ABNORMAL HIGH (ref 0.00–0.07)
Basophils Absolute: 0 10*3/uL (ref 0.0–0.1)
Basophils Relative: 0 %
Eosinophils Absolute: 0.1 10*3/uL (ref 0.0–0.5)
Eosinophils Relative: 1 %
HCT: 44.6 % (ref 36.0–46.0)
Hemoglobin: 15.5 g/dL — ABNORMAL HIGH (ref 12.0–15.0)
Immature Granulocytes: 1 %
Lymphocytes Relative: 14 %
Lymphs Abs: 1.1 10*3/uL (ref 0.7–4.0)
MCH: 34.9 pg — ABNORMAL HIGH (ref 26.0–34.0)
MCHC: 34.8 g/dL (ref 30.0–36.0)
MCV: 100.5 fL — ABNORMAL HIGH (ref 80.0–100.0)
Monocytes Absolute: 0.7 10*3/uL (ref 0.1–1.0)
Monocytes Relative: 10 %
Neutro Abs: 5.8 10*3/uL (ref 1.7–7.7)
Neutrophils Relative %: 74 %
Platelets: 211 10*3/uL (ref 150–400)
RBC: 4.44 MIL/uL (ref 3.87–5.11)
RDW: 12.1 % (ref 11.5–15.5)
WBC: 7.7 10*3/uL (ref 4.0–10.5)
nRBC: 0 % (ref 0.0–0.2)

## 2019-04-23 LAB — ABO/RH: ABO/RH(D): O POS

## 2019-04-23 LAB — SURGICAL PCR SCREEN
MRSA, PCR: POSITIVE — AB
Staphylococcus aureus: POSITIVE — AB

## 2019-04-23 LAB — APTT: aPTT: 24 seconds (ref 24–36)

## 2019-04-23 LAB — TYPE AND SCREEN
ABO/RH(D): O POS
Antibody Screen: NEGATIVE

## 2019-04-23 NOTE — Progress Notes (Signed)
Patient denies shortness of breath, fever, cough and chest pain at PAT appointment  PCP -  Dr Tedra [place] Cardiologist - Denies  Chest x-ray - denies EKG - 04/23/19 Stress Test - denies ECHO - denies Cardiac Cath - denies  Aspirin Instructions: Last dose on 04/22/19.  ERAS: Clears til 4:30 am DOS.  Ensure drink given  Anesthesia review: Yes  Coronavirus Screening Have you or your husband experienced the following symptoms:  Cough yes/no: No Fever (>100.98F)  yes/no: No Runny nose yes/no: No Sore throat yes/no: No Difficulty breathing/shortness of breath  yes/no: No  Have you or your husband traveled in the last 14 days and where? yes/no: No   Patient verbalized understanding of instructions that were given to them at the PAT appointment. Patient was also instructed that they will need to review over the PAT instructions again at home before surgery.

## 2019-04-23 NOTE — Progress Notes (Signed)
Called Dr [employer] office, spoke with Butch Penny about abnormal urine results.

## 2019-04-26 NOTE — Progress Notes (Signed)
Anesthesia Chart Review:  Case: [id number] Date/Time: 04/29/19 0715   Procedure: CERVICAL 3- CERVICAL 7 POSTERIOR CERVICAL DECOMPRESSION FUSION WITH INSTRUMENTATION AND ALLOGRAFT (N/A )   Anesthesia type: General   Pre-op diagnosis: PROGRESSIVE CERVICAL MYELOPATHY WITH SPINAL CORD COMPRESSION SPANNING CERVICAL 3 - CERVICAL 7.   [facility] ROOM 18 / [facility]   Surgeon: Dumonski, Mark, MD      DISCUSSION: Patient is a 72 year old female scheduled for the above procedure.  History includes former smoker (quit 1986), HTN ,HLD, palpitations, anxiety, anemia, skin cancer, multiple myeloma (T11, T12 compression fractures s/p kyphoplasties 08/22/16; s/p XRT to lower spine, completed 09/25/16; current treatment: Velcade/Pomalyst/Decadron since 09/02/18, but Pomalyst on hold since 11/09/18; Xgeva [place] Q 3 months, 01/27/19; IV Injectafer last dose 11/2018), hearing loss, facial cosmetic surgery.    Last seen by oncologist Dr. Ennever on 04/21/19, and he is aware of surgery plans. He wrote, "Her quality of life right now is to be dictated by the neck issues.  Again I do not see any problem with her having spinal surgery with myeloma.  We will  adjust her treatment schedule to accommodate surgery." Last bortezomib (Velcade) 04/21/19.   COVID-19 test is scheduled for 04/27/19. If negative and otherwise no acute changes and I would anticipate that she could proceed as planned.   VS: BP 121/67   Pulse 73   Temp (!) 36.4 C   Resp 20   Ht 5' (1.524 m)   Wt 51.3 kg   LMP  (LMP Unknown)   SpO2 99%   BMI 22.11 kg/m    PROVIDERS: Baxley, Mary J, MD is PCP  Ennever, Peter, MD is HEM-ONC. Last visit 04/21/19.    LABS: Preoperative labs noted. UA results already called to Donna at Dr. Dumonski's office by PAT RN. (all labs ordered are listed, but only abnormal results are displayed)  Labs Reviewed  SURGICAL PCR SCREEN - Abnormal; Notable for the following components:      Result Value   MRSA, PCR POSITIVE (*)     Staphylococcus aureus POSITIVE (*)    All other components within normal limits  CBC WITH DIFFERENTIAL/PLATELET - Abnormal; Notable for the following components:   Hemoglobin 15.5 (*)    MCV 100.5 (*)    MCH 34.9 (*)    Abs Immature Granulocytes 0.08 (*)    All other components within normal limits  COMPREHENSIVE METABOLIC PANEL - Abnormal; Notable for the following components:   Potassium 3.3 (*)    Glucose, Bld 137 (*)    Total Protein 6.3 (*)    All other components within normal limits  URINALYSIS, ROUTINE W REFLEX MICROSCOPIC - Abnormal; Notable for the following components:   APPearance HAZY (*)    Leukocytes,Ua LARGE (*)    WBC, UA >50 (*)    Bacteria, UA MANY (*)    Non Squamous Epithelial 0-5 (*)    All other components within normal limits  APTT  PROTIME-INR  TYPE AND SCREEN  ABO/RH     IMAGES: CT C-spine 03/22/19: IMPRESSION: 1. Severe cervical spondylosis. Extensive disc and facet degeneration with spurring causing spinal and foraminal stenosis. Moderate to severe spinal stenosis C3-4. Moderate spinal stenosis C4-5 and C5-6. Multilevel foraminal encroachment due to spurring. 2. 9 x 12 mm lytic lesion C6 vertebral body on the right consistent with myeloma, stable from prior studies.   EKG: 04/23/19: NSR   CV: N/A  Past Medical History:  Diagnosis Date  . Anemia   .   Anxiety   . Arthritis   . Cataracts, bilateral    removed by surgery  . Depression   . Dry eyes   . Goals of care, counseling/discussion 08/28/2016  . Hearing loss    bilateral - no hearing aids  . History of radiation therapy 09/04/16-09/25/16   spine T11-S3 30 Gy in 15 fractions  . Hyperlipidemia   . Hypertension   . Insomnia   . Iron deficiency anemia due to chronic blood loss 12/03/2018  . Iron malabsorption 12/03/2018  . Multiple myeloma (HCC) 08/22/2016   current chemo treatment  . Palpitations   . Skin cancer    mohs - surgery  . Vaginal dryness     Past Surgical History:   Procedure Laterality Date  . EYE SURGERY Bilateral    lasik  . EYE SURGERY Bilateral    cataracts removed  . FACIAL COSMETIC SURGERY     face lift  . KYPHOPLASTY N/A 08/22/2016   Procedure: THORACIC 11, THORACIC 12 KYPHOPLASTY;  Surgeon: Mark Dumonski, MD;  Location: [facility] OR;  Service: Orthopedics;  Laterality: N/A;  THORACIC 11, THORACIC 12 KYPHOPLASTY  . WISDOM TOOTH EXTRACTION      MEDICATIONS: . ALPRAZolam (XANAX) 0.25 MG tablet  . amLODipine (NORVASC) 5 MG tablet  . aspirin 81 MG EC tablet  . atorvastatin (LIPITOR) 20 MG tablet  . buPROPion (WELLBUTRIN XL) 300 MG 24 hr tablet  . Cholecalciferol (VITAMIN D) 2000 units CAPS  . dexamethasone (DECADRON) 4 MG tablet  . escitalopram (LEXAPRO) 20 MG tablet  . famciclovir (FAMVIR) 250 MG tablet  . ferrous sulfate 325 (65 FE) MG tablet  . potassium chloride SA (K-DUR,KLOR-CON) 20 MEQ tablet  . prochlorperazine (COMPAZINE) 10 MG tablet   No current facility-administered medications for this encounter.   Last ASA 04/22/19.    Allison Zelenak, PA-C Surgical Short Stay/Anesthesiology [facility] Phone [phone] [facility] Phone [phone] 04/26/2019 3:42 PM     

## 2019-04-26 NOTE — Anesthesia Preprocedure Evaluation (Deleted)
Anesthesia Evaluation    Airway        Dental   Pulmonary former smoker,           Cardiovascular hypertension,      Neuro/Psych    GI/Hepatic   Endo/Other    Renal/GU      Musculoskeletal   Abdominal   Peds  Hematology   Anesthesia Other Findings   Reproductive/Obstetrics                                                              Anesthesia Evaluation  Patient identified by MRN, date of birth, ID band Patient awake    Reviewed: Allergy & Precautions, NPO status , Patient's Chart, lab work & pertinent test results, reviewed documented beta blocker date and time   Airway Mallampati: II  TM Distance: >3 FB Neck ROM: Full    Dental no notable dental hx.    Pulmonary neg pulmonary ROS, former smoker,    Pulmonary exam normal breath sounds clear to auscultation       Cardiovascular hypertension, Pt. on medications and Pt. on home beta blockers  Rhythm:Regular Rate:Normal + Systolic murmurs    Neuro/Psych PSYCHIATRIC DISORDERS Anxiety Depression negative neurological ROS     GI/Hepatic negative GI ROS, Neg liver ROS, GERD  ,  Endo/Other  negative endocrine ROS  Renal/GU negative Renal ROS     Musculoskeletal  (+) Arthritis ,   Abdominal   Peds  Hematology  (+) anemia ,   Anesthesia Other Findings   Reproductive/Obstetrics negative OB ROS                             Anesthesia Physical Anesthesia Plan  ASA: II  Anesthesia Plan: General   Post-op Pain Management:    Induction: Intravenous  Airway Management Planned: Oral ETT  Additional Equipment:   Intra-op Plan:   Post-operative Plan: Extubation in OR  Informed Consent: I have reviewed the patients History and Physical, chart, labs and discussed the procedure including the risks, benefits and alternatives for the proposed anesthesia with the patient or authorized  representative who has indicated his/her understanding and acceptance.   Dental advisory given  Plan Discussed with: CRNA  Anesthesia Plan Comments:         Anesthesia Quick Evaluation  Anesthesia Physical Anesthesia Plan  ASA:   Anesthesia Plan:    Post-op Pain Management:    Induction:   PONV Risk Score and Plan:   Airway Management Planned:   Additional Equipment:   Intra-op Plan:   Post-operative Plan:   Informed Consent:   Plan Discussed with:   Anesthesia Plan Comments: (PAT note written by Myra Gianotti, PA-C. )        Anesthesia Quick Evaluation

## 2019-04-27 ENCOUNTER — Other Ambulatory Visit (HOSPITAL_COMMUNITY)
Admission: RE | Admit: 2019-04-27 | Discharge: 2019-04-27 | Disposition: A | Discharge status: Home / Self Care | Payer: PPO | Source: Ambulatory Visit | Attending: Orthopedic Surgery | Admitting: Orthopedic Surgery

## 2019-04-27 DIAGNOSIS — Z974 Presence of external hearing-aid: Secondary | ICD-10-CM | POA: Diagnosis not present

## 2019-04-27 DIAGNOSIS — I10 Essential (primary) hypertension: Secondary | ICD-10-CM | POA: Diagnosis present

## 2019-04-27 DIAGNOSIS — H918X3 Other specified hearing loss, bilateral: Secondary | ICD-10-CM | POA: Diagnosis present

## 2019-04-27 DIAGNOSIS — M50023 Cervical disc disorder at C6-C7 level with myelopathy: Secondary | ICD-10-CM | POA: Diagnosis not present

## 2019-04-27 DIAGNOSIS — Z85828 Personal history of other malignant neoplasm of skin: Secondary | ICD-10-CM | POA: Diagnosis not present

## 2019-04-27 DIAGNOSIS — Z981 Arthrodesis status: Secondary | ICD-10-CM | POA: Diagnosis not present

## 2019-04-27 DIAGNOSIS — Z79899 Other long term (current) drug therapy: Secondary | ICD-10-CM | POA: Diagnosis not present

## 2019-04-27 DIAGNOSIS — Z923 Personal history of irradiation: Secondary | ICD-10-CM | POA: Diagnosis not present

## 2019-04-27 DIAGNOSIS — Z7982 Long term (current) use of aspirin: Secondary | ICD-10-CM | POA: Diagnosis not present

## 2019-04-27 DIAGNOSIS — E785 Hyperlipidemia, unspecified: Secondary | ICD-10-CM | POA: Diagnosis present

## 2019-04-27 DIAGNOSIS — C9000 Multiple myeloma not having achieved remission: Secondary | ICD-10-CM | POA: Diagnosis present

## 2019-04-27 DIAGNOSIS — M5001 Cervical disc disorder with myelopathy,  high cervical region: Secondary | ICD-10-CM | POA: Diagnosis present

## 2019-04-27 DIAGNOSIS — G959 Disease of spinal cord, unspecified: Secondary | ICD-10-CM | POA: Diagnosis not present

## 2019-04-27 DIAGNOSIS — Z9842 Cataract extraction status, left eye: Secondary | ICD-10-CM | POA: Diagnosis not present

## 2019-04-27 DIAGNOSIS — M50322 Other cervical disc degeneration at C5-C6 level: Secondary | ICD-10-CM | POA: Diagnosis not present

## 2019-04-27 DIAGNOSIS — Z9841 Cataract extraction status, right eye: Secondary | ICD-10-CM | POA: Diagnosis not present

## 2019-04-27 DIAGNOSIS — Z20828 Contact with and (suspected) exposure to other viral communicable diseases: Secondary | ICD-10-CM | POA: Diagnosis present

## 2019-04-27 DIAGNOSIS — M5031 Other cervical disc degeneration,  high cervical region: Secondary | ICD-10-CM | POA: Diagnosis not present

## 2019-04-27 DIAGNOSIS — F419 Anxiety disorder, unspecified: Secondary | ICD-10-CM | POA: Diagnosis present

## 2019-04-27 DIAGNOSIS — M1990 Unspecified osteoarthritis, unspecified site: Secondary | ICD-10-CM | POA: Diagnosis present

## 2019-04-27 DIAGNOSIS — Z888 Allergy status to other drugs, medicaments and biological substances status: Secondary | ICD-10-CM | POA: Diagnosis not present

## 2019-04-27 DIAGNOSIS — Z87891 Personal history of nicotine dependence: Secondary | ICD-10-CM | POA: Diagnosis not present

## 2019-04-27 DIAGNOSIS — M50323 Other cervical disc degeneration at C6-C7 level: Secondary | ICD-10-CM | POA: Diagnosis not present

## 2019-04-27 DIAGNOSIS — M50021 Cervical disc disorder at C4-C5 level with myelopathy: Secondary | ICD-10-CM | POA: Diagnosis not present

## 2019-04-27 DIAGNOSIS — F329 Major depressive disorder, single episode, unspecified: Secondary | ICD-10-CM | POA: Diagnosis present

## 2019-04-27 DIAGNOSIS — M50022 Cervical disc disorder at C5-C6 level with myelopathy: Secondary | ICD-10-CM | POA: Diagnosis not present

## 2019-04-27 LAB — SARS CORONAVIRUS 2 (TAT 6-24 HRS): SARS Coronavirus 2: NEGATIVE

## 2019-04-29 ENCOUNTER — Inpatient Hospital Stay (HOSPITAL_COMMUNITY): Payer: PPO

## 2019-04-29 ENCOUNTER — Inpatient Hospital Stay (HOSPITAL_COMMUNITY): Payer: PPO | Admitting: Anesthesiology

## 2019-04-29 ENCOUNTER — Encounter (HOSPITAL_COMMUNITY): Payer: Self-pay | Admitting: Certified Registered Nurse Anesthetist

## 2019-04-29 ENCOUNTER — Inpatient Hospital Stay (HOSPITAL_COMMUNITY)
Admission: RE | Admit: 2019-04-29 | Discharge: 2019-04-30 | DRG: 472 | Disposition: A | Discharge status: Home / Self Care | Payer: PPO | Attending: Orthopedic Surgery | Admitting: Orthopedic Surgery

## 2019-04-29 ENCOUNTER — Encounter (HOSPITAL_COMMUNITY)
Admission: RE | Disposition: A | Discharge status: Home / Self Care | Payer: Self-pay | Source: Home / Self Care | Attending: Orthopedic Surgery

## 2019-04-29 ENCOUNTER — Other Ambulatory Visit: Payer: Self-pay

## 2019-04-29 ENCOUNTER — Inpatient Hospital Stay (HOSPITAL_COMMUNITY): Payer: PPO | Admitting: Vascular Surgery

## 2019-04-29 DIAGNOSIS — H918X3 Other specified hearing loss, bilateral: Secondary | ICD-10-CM | POA: Diagnosis present

## 2019-04-29 DIAGNOSIS — Z20828 Contact with and (suspected) exposure to other viral communicable diseases: Secondary | ICD-10-CM | POA: Diagnosis present

## 2019-04-29 DIAGNOSIS — Z9841 Cataract extraction status, right eye: Secondary | ICD-10-CM | POA: Diagnosis not present

## 2019-04-29 DIAGNOSIS — C9000 Multiple myeloma not having achieved remission: Secondary | ICD-10-CM | POA: Diagnosis present

## 2019-04-29 DIAGNOSIS — M50323 Other cervical disc degeneration at C6-C7 level: Secondary | ICD-10-CM | POA: Diagnosis not present

## 2019-04-29 DIAGNOSIS — M5001 Cervical disc disorder with myelopathy,  high cervical region: Principal | ICD-10-CM | POA: Diagnosis present

## 2019-04-29 DIAGNOSIS — Z419 Encounter for procedure for purposes other than remedying health state, unspecified: Secondary | ICD-10-CM

## 2019-04-29 DIAGNOSIS — Z974 Presence of external hearing-aid: Secondary | ICD-10-CM

## 2019-04-29 DIAGNOSIS — M1990 Unspecified osteoarthritis, unspecified site: Secondary | ICD-10-CM | POA: Diagnosis present

## 2019-04-29 DIAGNOSIS — Z7982 Long term (current) use of aspirin: Secondary | ICD-10-CM | POA: Diagnosis not present

## 2019-04-29 DIAGNOSIS — Z85828 Personal history of other malignant neoplasm of skin: Secondary | ICD-10-CM

## 2019-04-29 DIAGNOSIS — M5031 Other cervical disc degeneration,  high cervical region: Secondary | ICD-10-CM | POA: Diagnosis not present

## 2019-04-29 DIAGNOSIS — Z79899 Other long term (current) drug therapy: Secondary | ICD-10-CM | POA: Diagnosis not present

## 2019-04-29 DIAGNOSIS — F419 Anxiety disorder, unspecified: Secondary | ICD-10-CM | POA: Diagnosis present

## 2019-04-29 DIAGNOSIS — Z9842 Cataract extraction status, left eye: Secondary | ICD-10-CM | POA: Diagnosis not present

## 2019-04-29 DIAGNOSIS — I10 Essential (primary) hypertension: Secondary | ICD-10-CM | POA: Diagnosis present

## 2019-04-29 DIAGNOSIS — G959 Disease of spinal cord, unspecified: Secondary | ICD-10-CM | POA: Diagnosis not present

## 2019-04-29 DIAGNOSIS — M50021 Cervical disc disorder at C4-C5 level with myelopathy: Secondary | ICD-10-CM | POA: Diagnosis not present

## 2019-04-29 DIAGNOSIS — F329 Major depressive disorder, single episode, unspecified: Secondary | ICD-10-CM | POA: Diagnosis present

## 2019-04-29 DIAGNOSIS — Z87891 Personal history of nicotine dependence: Secondary | ICD-10-CM | POA: Diagnosis not present

## 2019-04-29 DIAGNOSIS — Z888 Allergy status to other drugs, medicaments and biological substances status: Secondary | ICD-10-CM | POA: Diagnosis not present

## 2019-04-29 DIAGNOSIS — Z923 Personal history of irradiation: Secondary | ICD-10-CM | POA: Diagnosis not present

## 2019-04-29 DIAGNOSIS — M50023 Cervical disc disorder at C6-C7 level with myelopathy: Secondary | ICD-10-CM | POA: Diagnosis not present

## 2019-04-29 DIAGNOSIS — Z981 Arthrodesis status: Secondary | ICD-10-CM | POA: Diagnosis not present

## 2019-04-29 DIAGNOSIS — E785 Hyperlipidemia, unspecified: Secondary | ICD-10-CM | POA: Diagnosis present

## 2019-04-29 DIAGNOSIS — M50322 Other cervical disc degeneration at C5-C6 level: Secondary | ICD-10-CM | POA: Diagnosis not present

## 2019-04-29 HISTORY — PX: POSTERIOR CERVICAL FUSION/FORAMINOTOMY: SHX5038

## 2019-04-29 SURGERY — POSTERIOR CERVICAL FUSION/FORAMINOTOMY LEVEL 4
Anesthesia: General | Site: Spine Cervical

## 2019-04-29 MED ORDER — BISACODYL 5 MG PO TBEC: 5.0000 mg | DELAYED_RELEASE_TABLET | Freq: Every day | ORAL | Status: DC | PRN

## 2019-04-29 MED ORDER — PROCHLORPERAZINE MALEATE 10 MG PO TABS
10.0000 mg | ORAL_TABLET | Freq: Four times a day (QID) | ORAL | Status: DC | PRN
  Filled 2019-04-29: qty 1

## 2019-04-29 MED ORDER — ALPRAZOLAM 0.25 MG PO TABS: 0.2500 mg | ORAL_TABLET | Freq: Two times a day (BID) | ORAL | Status: DC | PRN

## 2019-04-29 MED ORDER — BUPIVACAINE-EPINEPHRINE 0.25% -1:200000 IJ SOLN
INTRAMUSCULAR | Status: DC | PRN
  Administered 2019-04-29: 4 mL

## 2019-04-29 MED ORDER — DEXAMETHASONE SODIUM PHOSPHATE 10 MG/ML IJ SOLN
INTRAMUSCULAR | Status: AC
  Filled 2019-04-29: qty 1

## 2019-04-29 MED ORDER — ONDANSETRON HCL 4 MG/2ML IJ SOLN
INTRAMUSCULAR | Status: AC
  Filled 2019-04-29: qty 2

## 2019-04-29 MED ORDER — ACETAMINOPHEN 650 MG RE SUPP: 650.0000 mg | RECTAL | Status: DC | PRN

## 2019-04-29 MED ORDER — CEFAZOLIN SODIUM-DEXTROSE 2-4 GM/100ML-% IV SOLN
INTRAVENOUS | Status: AC
  Filled 2019-04-29: qty 100

## 2019-04-29 MED ORDER — LACTATED RINGERS IV SOLN
INTRAVENOUS | Status: DC
  Administered 2019-04-29: 07:00:00 via INTRAVENOUS

## 2019-04-29 MED ORDER — LIDOCAINE 2% (20 MG/ML) 5 ML SYRINGE
INTRAMUSCULAR | Status: DC | PRN
  Administered 2019-04-29: 60 mg via INTRAVENOUS

## 2019-04-29 MED ORDER — VANCOMYCIN HCL IN DEXTROSE 1-5 GM/200ML-% IV SOLN
INTRAVENOUS | Status: AC
  Administered 2019-04-29: 1000 mg via INTRAVENOUS
  Filled 2019-04-29: qty 200

## 2019-04-29 MED ORDER — BUPIVACAINE-EPINEPHRINE (PF) 0.25% -1:200000 IJ SOLN
INTRAMUSCULAR | Status: AC
  Filled 2019-04-29: qty 30

## 2019-04-29 MED ORDER — PROPOFOL 10 MG/ML IV BOLUS
INTRAVENOUS | Status: AC
  Filled 2019-04-29: qty 20

## 2019-04-29 MED ORDER — SODIUM CHLORIDE 0.9 % IV SOLN
INTRAVENOUS | Status: DC | PRN
  Administered 2019-04-29: 20 ug/min via INTRAVENOUS

## 2019-04-29 MED ORDER — PROMETHAZINE HCL 25 MG/ML IJ SOLN: 6.2500 mg | INTRAMUSCULAR | Status: DC | PRN

## 2019-04-29 MED ORDER — SODIUM CHLORIDE 0.9 % IV SOLN
250.0000 mL | INTRAVENOUS | Status: DC
  Administered 2019-04-29: 250 mL via INTRAVENOUS

## 2019-04-29 MED ORDER — FLEET ENEMA 7-19 GM/118ML RE ENEM: 1.0000 | ENEMA | Freq: Once | RECTAL | Status: DC | PRN

## 2019-04-29 MED ORDER — SUCCINYLCHOLINE CHLORIDE 200 MG/10ML IV SOSY
PREFILLED_SYRINGE | INTRAVENOUS | Status: AC
  Filled 2019-04-29: qty 10

## 2019-04-29 MED ORDER — 0.9 % SODIUM CHLORIDE (POUR BTL) OPTIME
TOPICAL | Status: DC | PRN
  Administered 2019-04-29 (×3): 1000 mL

## 2019-04-29 MED ORDER — OXYCODONE HCL 5 MG PO TABS: 5.0000 mg | ORAL_TABLET | Freq: Once | ORAL | Status: DC

## 2019-04-29 MED ORDER — ACETAMINOPHEN 500 MG PO TABS
1000.0000 mg | ORAL_TABLET | Freq: Once | ORAL | Status: AC
  Administered 2019-04-29: 1000 mg via ORAL
  Filled 2019-04-29: qty 2

## 2019-04-29 MED ORDER — FENTANYL CITRATE (PF) 250 MCG/5ML IJ SOLN
INTRAMUSCULAR | Status: AC
  Filled 2019-04-29: qty 5

## 2019-04-29 MED ORDER — BACITRACIN ZINC 500 UNIT/GM EX OINT
TOPICAL_OINTMENT | CUTANEOUS | Status: AC
  Filled 2019-04-29: qty 28.35

## 2019-04-29 MED ORDER — LIDOCAINE 2% (20 MG/ML) 5 ML SYRINGE
INTRAMUSCULAR | Status: AC
  Filled 2019-04-29: qty 5

## 2019-04-29 MED ORDER — ATORVASTATIN CALCIUM 10 MG PO TABS: 20.0000 mg | ORAL_TABLET | Freq: Every day | ORAL | Status: DC

## 2019-04-29 MED ORDER — BUPIVACAINE LIPOSOME 1.3 % IJ SUSP
INTRAMUSCULAR | Status: DC | PRN
  Administered 2019-04-29: 20 mL

## 2019-04-29 MED ORDER — DEXAMETHASONE SODIUM PHOSPHATE 10 MG/ML IJ SOLN
INTRAMUSCULAR | Status: DC | PRN
  Administered 2019-04-29: 10 mg via INTRAVENOUS

## 2019-04-29 MED ORDER — BUPROPION HCL ER (XL) 300 MG PO TB24: 300.0000 mg | ORAL_TABLET | Freq: Every day | ORAL | Status: DC

## 2019-04-29 MED ORDER — LACTATED RINGERS IV SOLN
INTRAVENOUS | Status: DC | PRN
  Administered 2019-04-29 (×2): via INTRAVENOUS

## 2019-04-29 MED ORDER — POTASSIUM CHLORIDE CRYS ER 20 MEQ PO TBCR: 20.0000 meq | EXTENDED_RELEASE_TABLET | Freq: Every day | ORAL | Status: DC

## 2019-04-29 MED ORDER — CHLORHEXIDINE GLUCONATE 4 % EX LIQD: 60.0000 mL | Freq: Once | CUTANEOUS | Status: DC

## 2019-04-29 MED ORDER — PROPOFOL 10 MG/ML IV BOLUS
INTRAVENOUS | Status: DC | PRN
  Administered 2019-04-29: 120 mg via INTRAVENOUS
  Administered 2019-04-29: 50 mg via INTRAVENOUS
  Administered 2019-04-29: 30 mg via INTRAVENOUS

## 2019-04-29 MED ORDER — THROMBIN 20000 UNITS EX SOLR
CUTANEOUS | Status: DC | PRN
  Administered 2019-04-29: 20 mL

## 2019-04-29 MED ORDER — CELECOXIB 200 MG PO CAPS
400.0000 mg | ORAL_CAPSULE | Freq: Once | ORAL | Status: AC
  Administered 2019-04-29: 400 mg via ORAL
  Filled 2019-04-29: qty 2

## 2019-04-29 MED ORDER — BUPIVACAINE LIPOSOME 1.3 % IJ SUSP
20.0000 mL | Freq: Once | INTRAMUSCULAR | Status: DC
  Filled 2019-04-29: qty 20

## 2019-04-29 MED ORDER — AMLODIPINE BESYLATE 5 MG PO TABS: 5.0000 mg | ORAL_TABLET | Freq: Every day | ORAL | Status: DC

## 2019-04-29 MED ORDER — DIAZEPAM 5 MG PO TABS
5.0000 mg | ORAL_TABLET | Freq: Four times a day (QID) | ORAL | Status: DC | PRN
  Administered 2019-04-30: 5 mg via ORAL
  Filled 2019-04-29: qty 1

## 2019-04-29 MED ORDER — MENTHOL 3 MG MT LOZG
1.0000 | LOZENGE | OROMUCOSAL | Status: DC | PRN
  Filled 2019-04-29: qty 9

## 2019-04-29 MED ORDER — SENNOSIDES-DOCUSATE SODIUM 8.6-50 MG PO TABS: 1.0000 | ORAL_TABLET | Freq: Every evening | ORAL | Status: DC | PRN

## 2019-04-29 MED ORDER — PHENOL 1.4 % MT LIQD: 1.0000 | OROMUCOSAL | Status: DC | PRN

## 2019-04-29 MED ORDER — BACITRACIN ZINC 500 UNIT/GM EX OINT
TOPICAL_OINTMENT | CUTANEOUS | Status: DC | PRN
  Administered 2019-04-29: 1 via TOPICAL

## 2019-04-29 MED ORDER — SUCCINYLCHOLINE CHLORIDE 200 MG/10ML IV SOSY
PREFILLED_SYRINGE | INTRAVENOUS | Status: DC | PRN
  Administered 2019-04-29: 100 mg via INTRAVENOUS

## 2019-04-29 MED ORDER — FERROUS SULFATE 325 (65 FE) MG PO TABS
650.0000 mg | ORAL_TABLET | Freq: Every day | ORAL | Status: DC
  Administered 2019-04-30: 650 mg via ORAL
  Filled 2019-04-29: qty 2

## 2019-04-29 MED ORDER — PANTOPRAZOLE SODIUM 40 MG PO TBEC
40.0000 mg | DELAYED_RELEASE_TABLET | Freq: Every day | ORAL | Status: DC
  Administered 2019-04-29: 40 mg via ORAL
  Filled 2019-04-29: qty 1

## 2019-04-29 MED ORDER — PANTOPRAZOLE SODIUM 40 MG IV SOLR: 40.0000 mg | Freq: Every day | INTRAVENOUS | Status: DC

## 2019-04-29 MED ORDER — ROCURONIUM BROMIDE 10 MG/ML (PF) SYRINGE
PREFILLED_SYRINGE | INTRAVENOUS | Status: AC
  Filled 2019-04-29: qty 10

## 2019-04-29 MED ORDER — OXYCODONE-ACETAMINOPHEN 5-325 MG PO TABS
1.0000 | ORAL_TABLET | ORAL | Status: DC | PRN
  Administered 2019-04-29 – 2019-04-30 (×2): 1 via ORAL
  Filled 2019-04-29 (×2): qty 1

## 2019-04-29 MED ORDER — ONDANSETRON HCL 4 MG/2ML IJ SOLN
INTRAMUSCULAR | Status: DC | PRN
  Administered 2019-04-29 (×2): 4 mg via INTRAVENOUS

## 2019-04-29 MED ORDER — FAMCICLOVIR 500 MG PO TABS
250.0000 mg | ORAL_TABLET | Freq: Every day | ORAL | Status: DC
  Filled 2019-04-29: qty 0.5

## 2019-04-29 MED ORDER — ONDANSETRON HCL 4 MG PO TABS: 4.0000 mg | ORAL_TABLET | Freq: Four times a day (QID) | ORAL | Status: DC | PRN

## 2019-04-29 MED ORDER — SODIUM CHLORIDE 0.9% FLUSH: 3.0000 mL | INTRAVENOUS | Status: DC | PRN

## 2019-04-29 MED ORDER — ACETAMINOPHEN 325 MG PO TABS: 650.0000 mg | ORAL_TABLET | ORAL | Status: DC | PRN

## 2019-04-29 MED ORDER — FENTANYL CITRATE (PF) 100 MCG/2ML IJ SOLN
INTRAMUSCULAR | Status: DC | PRN
  Administered 2019-04-29 (×5): 50 ug via INTRAVENOUS

## 2019-04-29 MED ORDER — CEFAZOLIN SODIUM-DEXTROSE 2-4 GM/100ML-% IV SOLN
2.0000 g | INTRAVENOUS | Status: AC
  Administered 2019-04-29: 08:00:00 2 g via INTRAVENOUS

## 2019-04-29 MED ORDER — ONDANSETRON HCL 4 MG/2ML IJ SOLN: 4.0000 mg | Freq: Four times a day (QID) | INTRAMUSCULAR | Status: DC | PRN

## 2019-04-29 MED ORDER — VITAMIN D3 25 MCG (1000 UNIT) PO TABS
2000.0000 [IU] | ORAL_TABLET | Freq: Every day | ORAL | Status: DC
  Filled 2019-04-29 (×2): qty 2

## 2019-04-29 MED ORDER — SODIUM CHLORIDE 0.9% FLUSH
3.0000 mL | Freq: Two times a day (BID) | INTRAVENOUS | Status: DC
  Administered 2019-04-29 (×2): 3 mL via INTRAVENOUS

## 2019-04-29 MED ORDER — FENTANYL CITRATE (PF) 100 MCG/2ML IJ SOLN: 25.0000 ug | INTRAMUSCULAR | Status: DC | PRN

## 2019-04-29 MED ORDER — ALUM & MAG HYDROXIDE-SIMETH 200-200-20 MG/5ML PO SUSP: 30.0000 mL | Freq: Four times a day (QID) | ORAL | Status: DC | PRN

## 2019-04-29 MED ORDER — SUGAMMADEX SODIUM 200 MG/2ML IV SOLN
INTRAVENOUS | Status: DC | PRN
  Administered 2019-04-29: 150 mg via INTRAVENOUS

## 2019-04-29 MED ORDER — ESCITALOPRAM OXALATE 20 MG PO TABS
20.0000 mg | ORAL_TABLET | Freq: Every day | ORAL | Status: DC
  Filled 2019-04-29: qty 1

## 2019-04-29 MED ORDER — THROMBIN 20000 UNITS EX SOLR
CUTANEOUS | Status: AC
  Filled 2019-04-29: qty 20000

## 2019-04-29 MED ORDER — VANCOMYCIN HCL IN DEXTROSE 1-5 GM/200ML-% IV SOLN
1000.0000 mg | Freq: Once | INTRAVENOUS | Status: AC
  Administered 2019-04-29 (×2): 1000 mg via INTRAVENOUS

## 2019-04-29 MED ORDER — ROCURONIUM BROMIDE 50 MG/5ML IV SOSY
PREFILLED_SYRINGE | INTRAVENOUS | Status: DC | PRN
  Administered 2019-04-29: 10 mg via INTRAVENOUS
  Administered 2019-04-29: 20 mg via INTRAVENOUS
  Administered 2019-04-29: 40 mg via INTRAVENOUS
  Administered 2019-04-29: 20 mg via INTRAVENOUS

## 2019-04-29 MED ORDER — POVIDONE-IODINE 7.5 % EX SOLN
Freq: Once | CUTANEOUS | Status: DC
  Filled 2019-04-29: qty 118

## 2019-04-29 MED ORDER — ZOLPIDEM TARTRATE 5 MG PO TABS: 5.0000 mg | ORAL_TABLET | Freq: Every evening | ORAL | Status: DC | PRN

## 2019-04-29 MED ORDER — CEFAZOLIN SODIUM-DEXTROSE 2-4 GM/100ML-% IV SOLN
2.0000 g | Freq: Three times a day (TID) | INTRAVENOUS | Status: AC
  Administered 2019-04-29 (×2): 2 g via INTRAVENOUS
  Filled 2019-04-29 (×2): qty 100

## 2019-04-29 MED ORDER — DOCUSATE SODIUM 100 MG PO CAPS
100.0000 mg | ORAL_CAPSULE | Freq: Two times a day (BID) | ORAL | Status: DC
  Administered 2019-04-29: 100 mg via ORAL
  Filled 2019-04-29: qty 1

## 2019-04-29 SURGICAL SUPPLY — 87 items
BENZOIN TINCTURE PRP APPL 2/3 (GAUZE/BANDAGES/DRESSINGS) ×3 IMPLANT
BIT DRILL MOUNTAINEER FIX 14 (BIT) ×1
BIT DRILL MOUNTAINEER FIX 14MM (BIT) IMPLANT
BIT DRILL MOUNTAINER FXED 12MM (DRILL) IMPLANT
BLADE CLIPPER SURG NEURO (BLADE) IMPLANT
BONE VIVIGEN FORMABLE 5.4CC (Bone Implant) ×2 IMPLANT
BUR NEURO DRILL SOFT 3.0X3.8M (BURR) ×2 IMPLANT
BUR PRESCISION 1.7 ELITE (BURR) ×3 IMPLANT
CONT SPEC 4OZ CLIKSEAL STRL BL (MISCELLANEOUS) ×2 IMPLANT
CORD BIPOLAR FORCEPS 12FT (ELECTRODE) ×2 IMPLANT
COVER BACK TABLE 80X110 HD (DRAPES) ×2 IMPLANT
COVER SURGICAL LIGHT HANDLE (MISCELLANEOUS) ×1 IMPLANT
COVER WAND RF STERILE (DRAPES) ×1 IMPLANT
DRAIN CHANNEL 10F 3/8 F FF (DRAIN) IMPLANT
DRAIN CHANNEL 15F RND FF W/TCR (WOUND CARE) IMPLANT
DRAIN HEMOVAC 1/8 X 5 (WOUND CARE) IMPLANT
DRAPE C-ARM 42X72 X-RAY (DRAPES) IMPLANT
DRAPE HALF SHEET 40X57 (DRAPES) ×5 IMPLANT
DRAPE INCISE IOBAN 66X45 STRL (DRAPES) ×2 IMPLANT
DRAPE LAPAROTOMY 100X72 PEDS (DRAPES) ×2 IMPLANT
DRAPE POUCH INSTRU U-SHP 10X18 (DRAPES) ×2 IMPLANT
DRAPE SURG 17X23 STRL (DRAPES) ×12 IMPLANT
DRILL BIT MOUNTAINEER FIX 14MM (BIT) ×1
DRILL MOUNTAINEER FIXED 12MM (DRILL) ×2
DRSG MEPILEX BORDER 4X8 (GAUZE/BANDAGES/DRESSINGS) ×2 IMPLANT
DURAPREP 26ML APPLICATOR (WOUND CARE) ×2 IMPLANT
ELECT BLADE 4.0 EZ CLEAN MEGAD (MISCELLANEOUS) ×2
ELECT CAUTERY BLADE 6.4 (BLADE) ×2 IMPLANT
ELECT REM PT RETURN 9FT ADLT (ELECTROSURGICAL) ×2
ELECTRODE BLDE 4.0 EZ CLN MEGD (MISCELLANEOUS) ×1 IMPLANT
ELECTRODE REM PT RTRN 9FT ADLT (ELECTROSURGICAL) ×1 IMPLANT
EVACUATOR SILICONE 100CC (DRAIN) IMPLANT
GAUZE 4X4 16PLY RFD (DISPOSABLE) ×8 IMPLANT
GAUZE SPONGE 4X4 12PLY STRL (GAUZE/BANDAGES/DRESSINGS) ×2 IMPLANT
GLOVE BIO SURGEON STRL SZ7 (GLOVE) ×2 IMPLANT
GLOVE BIO SURGEON STRL SZ8 (GLOVE) ×2 IMPLANT
GLOVE BIOGEL PI IND STRL 7.5 (GLOVE) ×1 IMPLANT
GLOVE BIOGEL PI IND STRL 8 (GLOVE) ×1 IMPLANT
GLOVE BIOGEL PI INDICATOR 7.5 (GLOVE) ×1
GLOVE BIOGEL PI INDICATOR 8 (GLOVE) ×1
GOWN STRL REUS W/ TWL LRG LVL3 (GOWN DISPOSABLE) ×4 IMPLANT
GOWN STRL REUS W/ TWL XL LVL3 (GOWN DISPOSABLE) ×1 IMPLANT
GOWN STRL REUS W/TWL LRG LVL3 (GOWN DISPOSABLE) ×4
GOWN STRL REUS W/TWL XL LVL3 (GOWN DISPOSABLE) ×1
GRAFT BNE MATRIX VG FRMBL MD 5 (Bone Implant) IMPLANT
IV CATH 14GX2 1/4 (CATHETERS) ×2 IMPLANT
KIT BASIN OR (CUSTOM PROCEDURE TRAY) ×2 IMPLANT
KIT TURNOVER KIT B (KITS) ×2 IMPLANT
NDL HYPO 25GX1X1/2 BEV (NEEDLE) ×1 IMPLANT
NDL PRECISIONGLIDE 27X1.5 (NEEDLE) ×1 IMPLANT
NEEDLE HYPO 25GX1X1/2 BEV (NEEDLE) ×2 IMPLANT
NEEDLE PRECISIONGLIDE 27X1.5 (NEEDLE) ×2 IMPLANT
NS IRRIG 1000ML POUR BTL (IV SOLUTION) ×4 IMPLANT
PACK LAMINECTOMY ORTHO (CUSTOM PROCEDURE TRAY) ×2 IMPLANT
PACK UNIVERSAL I (CUSTOM PROCEDURE TRAY) ×2 IMPLANT
PAD ARMBOARD 7.5X6 YLW CONV (MISCELLANEOUS) ×4 IMPLANT
PATTIES SURGICAL .5 X.5 (GAUZE/BANDAGES/DRESSINGS) ×2 IMPLANT
PATTIES SURGICAL .5 X3 (DISPOSABLE) IMPLANT
PATTIES SURGICAL .5X1.5 (GAUZE/BANDAGES/DRESSINGS) ×1 IMPLANT
PIN MAYFIELD SKULL DISP (PIN) ×2 IMPLANT
PUTTY DBX 1CC (Putty) ×2 IMPLANT
PUTTY DBX 1CC DEPUY (Putty) IMPLANT
ROD LOR TI 4.0X050 SYMPH (Rod) ×2 IMPLANT
ROD TEMPLATE MOUNTAINEER 120MM (ROD) ×1 IMPLANT
SCREW 4.0 PLY 3.5X20 SYMPH (Screw) ×2 IMPLANT
SCREW POLY 3.5X12 FOR 4 ROD (Screw) ×6 IMPLANT
SCREW SET SPINAL (Screw) ×8 IMPLANT
SPONGE INTESTINAL PEANUT (DISPOSABLE) ×3 IMPLANT
SPONGE SURGIFOAM ABS GEL 100 (HEMOSTASIS) ×2 IMPLANT
STRIP CLOSURE SKIN 1/2X4 (GAUZE/BANDAGES/DRESSINGS) ×1 IMPLANT
SURGIFLO W/THROMBIN 8M KIT (HEMOSTASIS) IMPLANT
SUT MNCRL AB 4-0 PS2 18 (SUTURE) ×2 IMPLANT
SUT VIC AB 0 CT1 18XCR BRD 8 (SUTURE) ×1 IMPLANT
SUT VIC AB 0 CT1 8-18 (SUTURE) ×1
SUT VIC AB 1 CT1 18XCR BRD 8 (SUTURE) ×2 IMPLANT
SUT VIC AB 1 CT1 8-18 (SUTURE) ×2
SUT VIC AB 2-0 CT2 18 VCP726D (SUTURE) ×3 IMPLANT
SYR BULB IRRIGATION 50ML (SYRINGE) ×2 IMPLANT
SYR CONTROL 10ML LL (SYRINGE) ×3 IMPLANT
TAP MOUNTAINEER 3MM (TAP) ×1 IMPLANT
TAPE CLOTH 4X10 WHT NS (GAUZE/BANDAGES/DRESSINGS) ×2 IMPLANT
TAPE CLOTH SURG 4X10 WHT LF (GAUZE/BANDAGES/DRESSINGS) ×1 IMPLANT
TOWEL GREEN STERILE (TOWEL DISPOSABLE) ×2 IMPLANT
TOWEL GREEN STERILE FF (TOWEL DISPOSABLE) ×2 IMPLANT
TRAY FOLEY MTR SLVR 16FR STAT (SET/KITS/TRAYS/PACK) ×2 IMPLANT
WATER STERILE IRR 1000ML POUR (IV SOLUTION) ×2 IMPLANT
YANKAUER SUCT BULB TIP NO VENT (SUCTIONS) ×2 IMPLANT

## 2019-04-29 NOTE — Anesthesia Preprocedure Evaluation (Addendum)
Anesthesia Evaluation  Patient identified by MRN, date of birth, ID band Patient awake    Reviewed: Allergy & Precautions, NPO status , Patient's Chart, lab work & pertinent test results  History of Anesthesia Complications Negative for: history of anesthetic complications  Airway Mallampati: II  TM Distance: >3 FB Neck ROM: Full    Dental no notable dental hx. (+) Caps, Dental Advisory Given   Pulmonary neg pulmonary ROS, former smoker,    Pulmonary exam normal        Cardiovascular hypertension, Pt. on medications Normal cardiovascular exam + Systolic murmurs    Neuro/Psych PSYCHIATRIC DISORDERS Anxiety Depression negative neurological ROS     GI/Hepatic Neg liver ROS, GERD  ,  Endo/Other  negative endocrine ROS  Renal/GU negative Renal ROS     Musculoskeletal  (+) Arthritis ,   Abdominal   Peds  Hematology  (+) anemia ,   Anesthesia Other Findings   Reproductive/Obstetrics negative OB ROS                            Anesthesia Physical  Anesthesia Plan  ASA: III  Anesthesia Plan: General   Post-op Pain Management:    Induction: Intravenous  PONV Risk Score and Plan: 3 and Ondansetron, Dexamethasone, Diphenhydramine and Treatment may vary due to age or medical condition  Airway Management Planned: Video Laryngoscope Planned  Additional Equipment:   Intra-op Plan:   Post-operative Plan: Extubation in OR  Informed Consent: I have reviewed the patients History and Physical, chart, labs and discussed the procedure including the risks, benefits and alternatives for the proposed anesthesia with the patient or authorized representative who has indicated his/her understanding and acceptance.     Dental advisory given  Plan Discussed with: CRNA, Anesthesiologist and Surgeon  Anesthesia Plan Comments:       Anesthesia Quick Evaluation

## 2019-04-29 NOTE — Anesthesia Postprocedure Evaluation (Signed)
Anesthesia Post Note  Patient: HOORIYA HUMMINGBIRD  Procedure(s) Performed: CERVICAL THREE- CERVICAL SEVEN POSTERIOR CERVICAL DECOMPRESSION FUSION WITH INSTRUMENTATION AND ALLOGRAFT (N/A Spine Cervical)     Patient location during evaluation: PACU Anesthesia Type: General Level of consciousness: sedated Pain management: pain level controlled Vital Signs Assessment: post-procedure vital signs reviewed and stable Respiratory status: spontaneous breathing and respiratory function stable Cardiovascular status: stable Postop Assessment: no apparent nausea or vomiting Anesthetic complications: no    Last Vitals:  Vitals:   04/29/19 1418 04/29/19 1615  BP: (!) 143/85 (!) 153/81  Pulse: 99 87  Resp: 18 16  Temp: 36.7 C 36.9 C  SpO2: 98% 99%    Last Pain:  Vitals:   04/29/19 1615  TempSrc: Oral  PainSc:                  Miryah Ralls DANIEL

## 2019-04-29 NOTE — Op Note (Signed)
PATIENT NAME: Debra Ruiz   MEDICAL RECORD NO.:   [id number]    DATE OF BIRTH: 09-28-1945   DATE OF PROCEDURE: 04/29/2019  OPERATIVE REPORT   PREOPERATIVE DIAGNOSES: 1.  Cervical myelopathy 2.  Spinal cord compression spanning C3-C7 3.  Severe DDD C3-C7  POSTOPERATIVE DIAGNOSES: 1.  Cervical myelopathy 2.  Spinal cord compression spanning C3-C7 3.  Severe DDD C3-C7  PROCEDURE: 1.  Posterior spinal fusion C3-C4, C4-C5, C5-C6, C6-C7. 2.  Placement of posterior segmental instrumentation C3-C7. 3.  Laminectomy with decompression of the spinal cord C3/4, C4/5, C5/6, C6/7 4.  Bilateral foraminotomy C6/7  5.  Use of local autograft. 6.  Use of morselized allograft - Vivigen, DBX putty. 7.  Cranial tong application and removal. 8.  Intraoperative use of fluoroscopy.  SURGEON:  Phylliss Bob, MD  ASSISTANT:  Pricilla Holm, PA-C   ANESTHESIA:  General endotracheal anesthesia.  COMPLICATIONS:  None.  DISPOSITION:  Stable.  ESTIMATED BLOOD LOSS:  200cc  INDICATIONS FOR SURGERY:  Briefly, the patient is a very pleasant 73 year old female who has been having deterioration in balance over the course of the last year.  Her MRI findings were as noted above.   Given her ongoing symptoms, we did discuss proceeding with the procedure reflected above.  The patient was fully aware of the risks and limitations of surgery and did wish to proceed.  OPERATIVE DETAILS:  On 04/29/2019, the patient was brought to surgery and general endotracheal anesthesia was administered.  A Mayfield head holder was applied, and the patient was then rolled prone, and the Mayfield headholder was secured to the bed  with the head positioned neutrally.  The patient's arms were secured to her sides, and all bony prominences were padded.  The neck was then prepped and draped posteriorly in the usual fashion.  A timeout procedure was performed.  At this point, I did make a midline incision.  The fascia was incised at  the midline.  The paraspinal musculature was bluntly swept laterally, and the posterior elements of C3, C4, C5, C6 and C7 were identified and subperiosteally exposed.  The  facet joints to be fused were subperiosteally exposed as well.  I did use a 1.7 mm high-speed burr to decorticate the facet joints at C3-C4, C4-C5, and C5-C6 bilaterally.  A 3 mm bur was used to decorticate the posterior elements across these levels and also across the C6/7 level as well.  Using anatomic landmarks, I did use a 1.7 mm bur to prepare the entry point of the lateral mass screws at C3, C4, and C5 bilaterally, and then a 30mm tap was used. A laminotomy and foraminotomy was then performed bilaterally at C6/7, and the C7 pedicles were cannulated and tapped using a 57mm tap. At this point, using a bur, the central 86mm of the lamina of C4, C5, and C6 was removed en bloc, thereby decompressing the spinal canal from C3-C7. Great care was used to ensure no undue manipulation or pressure of the spinal cord. At this point, 3.5 x 12 mm screws were secured into the lateral masses of C3, C4, and C5 bilaterally. 3.5 x 60mm screws were placed into the C7 pedicles.  A combination of DBX putty and Vivigen was then placed along the posterior elements and facet joints to be fused.  Autograft taken from the spinous processes was also packed posteriorly.  A rod was then contoured into the appropriate degree of lordosis and secured into the tulip heads of the screws bilaterally.  Caps were then placed, and a final locking procedure was performed.  Prior to placing the bone graft, I did liberally irrigate the wound with a total of approximately 2 L of normal saline.  I  did use AP and lateral fluoroscopy to ensure appropriate positioning of the screws, which I was very happy with.  At this point, the wound was closed in layers using #1 Vicryl, followed by 2-0 Vicryl, followed by 4-0 Monocryl.  Benzoin and Steri-Strips were applied, followed by sterile  dressing.  All instrument counts were correct at the termination of the procedure. The Mayfield head holder was removed after the patient was placed supine.   Of note, Pricilla Holm was my assistant throughout surgery and did aid in retraction, suctioning, and closure from start to finish.  Phylliss Bob, MD

## 2019-04-29 NOTE — Anesthesia Procedure Notes (Signed)
Procedure Name: Intubation Date/Time: 04/29/2019 7:44 AM Performed by: Genelle Bal, CRNA Pre-anesthesia Checklist: Patient identified, Emergency Drugs available, Suction available and Patient being monitored Patient Re-evaluated:Patient Re-evaluated prior to induction Oxygen Delivery Method: Circle system utilized Preoxygenation: Pre-oxygenation with 100% oxygen Induction Type: IV induction Ventilation: Mask ventilation without difficulty Laryngoscope Size: Glidescope and 3 Grade View: Grade I Tube type: Oral Tube size: 7.0 mm Number of attempts: 1 Airway Equipment and Method: Stylet and Oral airway Placement Confirmation: ETT inserted through vocal cords under direct vision,  positive ETCO2 and breath sounds checked- equal and bilateral Secured at: 21 cm Tube secured with: Tape Dental Injury: Teeth and Oropharynx as per pre-operative assessment  Comments: Elective glidescope intubation d/t cervical cord compression

## 2019-04-29 NOTE — H&P (Addendum)
PREOPERATIVE H&P  Chief Complaint: Deterioration in fine motor skills and bilateral hand numbness  HPI: Debra Ruiz is a 73 y.o. female who presents with ongoing symptoms as outlined above.  MRI reveals spinal cord compression spanning C3-C7  Patient has failed multiple forms of conservative care and continues to have pain (see office notes for additional details regarding the patient's full course of treatment)  Past Medical History:  Diagnosis Date  . Anemia   . Anxiety   . Arthritis   . Cataracts, bilateral    removed by surgery  . Depression   . Dry eyes   . Goals of care, counseling/discussion 08/28/2016  . Hearing loss    bilateral - no hearing aids  . History of radiation therapy 09/04/16-09/25/16   spine T11-S3 30 Gy in 15 fractions  . Hyperlipidemia   . Hypertension   . Insomnia   . Iron deficiency anemia due to chronic blood loss 12/03/2018  . Iron malabsorption 12/03/2018  . Multiple myeloma ([place]) 08/22/2016   current chemo treatment  . Palpitations   . Skin cancer    mohs - surgery  . Vaginal dryness    Past Surgical History:  Procedure Laterality Date  . EYE SURGERY Bilateral    lasik  . EYE SURGERY Bilateral    cataracts removed  . FACIAL COSMETIC SURGERY     face lift  . KYPHOPLASTY N/A 08/22/2016   Procedure: THORACIC 11, THORACIC 12 KYPHOPLASTY;  Surgeon: Phylliss Bob, MD;  Location: [facility];  Service: Orthopedics;  Laterality: N/A;  THORACIC 11, THORACIC 12 KYPHOPLASTY  . WISDOM TOOTH EXTRACTION     Social History   Socioeconomic History  . Marital status: Married    Spouse name: Not on file  . Number of children: 0  . Years of education: Not on file  . Highest education level: Not on file  Occupational History  . Occupation: Science writer  . Financial resource strain: Not on file  . Food insecurity    Worry: Not on file    Inability: Not on file  . Transportation needs    Medical: Not on file    Non-medical: Not on file   Tobacco Use  . Smoking status: Former Smoker    Packs/day: 1.00    Years: 12.00    Pack years: 12.00    Types: Cigarettes    Quit date: 02/13/1985    Years since quitting: 34.2  . Smokeless tobacco: Never Used  Substance and Sexual Activity  . Alcohol use: Yes    Alcohol/week: 7.0 standard drinks    Types: 7 Glasses of wine per week  . Drug use: No  . Sexual activity: Not Currently    Birth control/protection: Post-menopausal  Lifestyle  . Physical activity    Days per week: Not on file    Minutes per session: Not on file  . Stress: Not on file  Relationships  . Social Herbalist on phone: Not on file    Gets together: Not on file    Attends religious service: Not on file    Active member of club or organization: Not on file    Attends meetings of clubs or organizations: Not on file    Relationship status: Not on file  Other Topics Concern  . Not on file  Social History Narrative  . Not on file   Family History  Problem Relation Age of Onset  . Hypertension Mother   .  Stroke Mother   . Heart disease Father   . Heart disease Brother   . Multiple myeloma Cousin   . Multiple myeloma Maternal Aunt    Allergies  Allergen Reactions  . Crestor [Rosuvastatin Calcium]     Causes liver functions to be elevated   Prior to Admission medications   Medication Sig Start Date End Date Taking? Authorizing Provider  ALPRAZolam (XANAX) 0.25 MG tablet Take 1 tablet (0.25 mg total) by mouth 2 (two) times daily as needed. for anxiety 03/08/19  Yes Baxley, Cresenciano Lick, MD  amLODipine (NORVASC) 5 MG tablet TAKE 1 TABLET(5 MG) BY MOUTH DAILY Patient taking differently: Take 5 mg by mouth daily.  07/19/18  Yes BaxleyCresenciano Lick, MD  aspirin 81 MG EC tablet Take 81 mg by mouth daily.    Yes [provider]  atorvastatin (LIPITOR) 20 MG tablet TAKE 1 TABLET(20 MG) BY MOUTH DAILY Patient taking differently: Take 20 mg by mouth daily. TAKE 1 TABLET(20 MG) BY MOUTH DAILY 12/03/18   Yes Baxley, Cresenciano Lick, MD  buPROPion (WELLBUTRIN XL) 300 MG 24 hr tablet Take 1 tablet (300 mg total) by mouth daily. 04/01/19  Yes Baxley, Cresenciano Lick, MD  Cholecalciferol (VITAMIN D) 2000 units CAPS Take 2,000 Units by mouth daily.    Yes [provider]  dexamethasone (DECADRON) 4 MG tablet TAKE 3 TABLETS BY MOUTH 1 TIME A WEEK 04/23/19  Yes Ennever, Rudell Cobb, MD  escitalopram (LEXAPRO) 20 MG tablet TAKE 1 TABLET(20 MG) BY MOUTH DAILY Patient taking differently: TAKE 1 TABLET(20 MG) BY MOUTH DAILY qhs 12/15/18  Yes Baxley, Cresenciano Lick, MD  famciclovir (FAMVIR) 250 MG tablet TAKE 1 TABLET(250 MG) BY MOUTH DAILY Patient taking differently: Take 250 mg by mouth daily.  11/13/18  Yes [name], MD  ferrous sulfate 325 (65 FE) MG tablet Take 650 mg by mouth daily with breakfast.   Yes [provider]  potassium chloride SA (K-DUR,KLOR-CON) 20 MEQ tablet TAKE 1 TABLET(20 MEQ) BY MOUTH DAILY Patient taking differently: Take 20 mEq by mouth daily.  10/15/18  Yes Baxley, Cresenciano Lick, MD  prochlorperazine (COMPAZINE) 10 MG tablet TAKE 1 TABLET(10 MG) BY MOUTH EVERY 6 HOURS AS NEEDED FOR NAUSEA OR VOMITING Patient taking differently: Take 10 mg by mouth every 6 (six) hours as needed for nausea or vomiting.  11/06/16  Yes Ennever, Rudell Cobb, MD     All other systems have been reviewed and were otherwise negative with the exception of those mentioned in the HPI and as above.  Physical Exam: Vitals:   04/29/19 0635  BP: 125/61  Pulse: 86  Resp: 17  Temp: 98.2 F (36.8 C)  SpO2: 96%    Body mass index is 22.11 kg/m.  General: Alert, no acute distress Cardiovascular: No pedal edema Respiratory: No cyanosis, no use of accessory musculature Skin: No lesions in the area of chief complaint Neurologic: Sensation intact distally Psychiatric: Patient is competent for consent with normal mood and affect Lymphatic: No axillary or cervical lymphadenopathy  MUSCULOSKELETAL: + hoffman's sign bilaterally   Assessment/Plan: PROGRESSIVE CERVICAL MYELOPATHY WITH SPINAL CORD COMPRESSION SPANNING CERVICAL 3 - CERVICAL 7. Plan for Procedure(s): CERVICAL 3- CERVICAL 7 POSTERIOR CERVICAL DECOMPRESSION FUSION WITH INSTRUMENTATION AND ALLOGRAFT   Norva Karvonen, MD 04/29/2019 7:16 AM

## 2019-04-29 NOTE — Transfer of Care (Signed)
Immediate Anesthesia Transfer of Care Note  Patient: Debra Ruiz  Procedure(s) Performed: CERVICAL THREE- CERVICAL SEVEN POSTERIOR CERVICAL DECOMPRESSION FUSION WITH INSTRUMENTATION AND ALLOGRAFT (N/A Spine Cervical)  Patient Location: PACU  Anesthesia Type:General  Level of Consciousness: awake, alert  and oriented  Airway & Oxygen Therapy: Patient Spontanous Breathing and Patient connected to face mask oxygen  Post-op Assessment: Report given to RN and Post -op Vital signs reviewed and stable  Post vital signs: Reviewed and stable  Last Vitals:  Vitals Value Taken Time  BP 135/82 04/29/19 1240  Temp    Pulse 105 04/29/19 1241  Resp 14 04/29/19 1241  SpO2 97 % 04/29/19 1241  Vitals shown include unvalidated device data.  Last Pain:  Vitals:   04/29/19 0704  TempSrc:   PainSc: 0-No pain         Complications: No apparent anesthesia complications

## 2019-04-30 MED ORDER — DIAZEPAM 5 MG PO TABS: 5.0000 mg | ORAL_TABLET | Freq: Four times a day (QID) | ORAL | 0 refills | Status: DC | PRN

## 2019-04-30 MED ORDER — OXYCODONE-ACETAMINOPHEN 5-325 MG PO TABS: 1.0000 | ORAL_TABLET | ORAL | 0 refills | Status: DC | PRN

## 2019-04-30 NOTE — Progress Notes (Signed)
    Patient doing well  Denies arm pain Tolerating PO well   Physical Exam: Vitals:   04/30/19 0352 04/30/19 0708  BP: (!) 141/69 (!) 154/70  Pulse: 79 79  Resp:  16  Temp:  97.7 F (36.5 C)  SpO2:  100%    Neck soft/supple Dressing in place NVI  POD #1 s/p PCDF C3-C7, doing well  - encourage ambulation - Percocet for pain, Valium for muscle spasms - d/c home today with f/u in 2 weeks

## 2019-04-30 NOTE — Evaluation (Signed)
Physical Therapy Evaluation Patient Details Name: Debra Ruiz MRN: [id number] DOB: [birth year] Today's Date: 04/30/2019   History of Present Illness  Pt is a 73 yo female s/p C3-7 PCDF. PMHx: anxiety, cancer, depression, arthritis, and back surgery T11-T12.  Clinical Impression  Pt admitted s/p surgery listed above. Pt with seemingly good pain control and denies radicular symptoms or numbness/tingling. Ambulating 400 feet with use of walker at a supervision level. Demonstrates good gait speed, with light reliance on walker for balance. Education provided re: generalized walking program, cervical precautions, brace use, car transfer technique. Pt and pt husband verbalized understanding and have no further questions. PT signing off.     Follow Up Recommendations No PT follow up;Supervision for mobility/OOB    Equipment Recommendations  Rolling walker with 5" wheels    Recommendations for Other Services       Precautions / Restrictions Precautions Precautions: Cervical Precaution Booklet Issued: Yes (comment) Precaution Comments: verbally reinforced Required Braces or Orthoses: Cervical Brace Cervical Brace: Hard collar;At all times Restrictions Weight Bearing Restrictions: No      Mobility  Bed Mobility Overal bed mobility: Needs Assistance Bed Mobility: Rolling;Sidelying to Sit;Sit to Sidelying Rolling: Supervision Sidelying to sit: Supervision     Sit to sidelying: Min guard General bed mobility comments: OOB in chair  Transfers Overall transfer level: Needs assistance Equipment used: Rolling walker (2 wheeled) Transfers: Sit to/from Stand Sit to Stand: Supervision         General transfer comment: SupervisionA for stability  Ambulation/Gait Ambulation/Gait assistance: Supervision Gait Distance (Feet): 400 Feet Assistive device: Rolling walker (2 wheeled) Gait Pattern/deviations: Step-through pattern;Decreased dorsiflexion - left     General Gait Details:  Decreased left heel strike at initial contact and pronated position during midstance. Cues for walker proximity. Occasionally drifting left but able to self correct  Stairs            Wheelchair Mobility    Modified Rankin (Stroke Patients Only)       Balance Overall balance assessment: Mild deficits observed, not formally tested                                           Pertinent Vitals/Pain Pain Assessment: Faces Faces Pain Scale: Hurts a little bit Pain Location: neck Pain Descriptors / Indicators: Discomfort Pain Intervention(s): Monitored during session    Home Living Family/patient expects to be discharged to:: Private residence Living Arrangements: Spouse/significant other Available Help at Discharge: Family;Available 24 hours/day Type of Home: House Home Access: Ramped entrance     Home Layout: Two level;Full bath on main level;Able to live on main level with bedroom/bathroom Home Equipment: Walker - standard;Shower seat      Prior Function Level of Independence: Independent with assistive device(s)               Hand Dominance   Dominant Hand: Right    Extremity/Trunk Assessment   Upper Extremity Assessment Upper Extremity Assessment: Defer to OT evaluation    Lower Extremity Assessment Lower Extremity Assessment: RLE deficits/detail;LLE deficits/detail RLE Deficits / Details: Strength 5/5 LLE Deficits / Details: Strength 5/5    Cervical / Trunk Assessment Cervical / Trunk Assessment: Other exceptions Cervical / Trunk Exceptions: s/p cervical sx  Communication   Communication: No difficulties  Cognition Arousal/Alertness: Awake/alert Behavior During Therapy: WFL for tasks assessed/performed Overall Cognitive Status: Within Functional Limits for tasks assessed  General Comments General comments (skin integrity, edema, etc.): Spouse present for session and saw  bed mobility, dressing and understanding her precautions well.    Exercises     Assessment/Plan    PT Assessment Patent does not need any further PT services  PT Problem List         PT Treatment Interventions      PT Goals (Current goals can be found in the Care Plan section)  Acute Rehab PT Goals Patient Stated Goal: "not be so tired and walk more." PT Goal Formulation: All assessment and education complete, DC therapy    Frequency     Barriers to discharge        Co-evaluation               AM-PAC PT "6 Clicks" Mobility  Outcome Measure Help needed turning from your back to your side while in a flat bed without using bedrails?: None Help needed moving from lying on your back to sitting on the side of a flat bed without using bedrails?: None Help needed moving to and from a bed to a chair (including a wheelchair)?: None Help needed standing up from a chair using your arms (e.g., wheelchair or bedside chair)?: None Help needed to walk in hospital room?: None Help needed climbing 3-5 steps with a railing? : A Little 6 Click Score: 23    End of Session Equipment Utilized During Treatment: Cervical collar Activity Tolerance: Patient tolerated treatment well Patient left: in chair;with call bell/phone within reach;with family/visitor present Nurse Communication: Mobility status PT Visit Diagnosis: Pain;Difficulty in walking, not elsewhere classified (R26.2) Pain - part of body: (cervical)    Time: [id number] PT Time Calculation (min) (ACUTE ONLY): 13 min   Charges:   PT Evaluation $PT Eval Moderate Complexity: 1 Mod          Ellamae Sia, PT, DPT Acute Rehabilitation Services Pager [phone] Office [phone]   Debra Ruiz 04/30/2019, 9:14 AM

## 2019-04-30 NOTE — Plan of Care (Signed)
Patient alert and oriented, mae's well, voiding adequate amount of urine, swallowing without difficulty, no c/o pain at time of discharge. Patient discharged home with family. Script and discharged instructions given to patient. Patient and family stated understanding of instructions given. Patient has an appointment with Dr. Dumonski 

## 2019-04-30 NOTE — Evaluation (Signed)
Occupational Therapy Evaluation Patient Details Name: Debra Ruiz MRN: [id number] DOB: July 17, 1946 Today's Date: 04/30/2019    History of Present Illness Pt is a 73 yo female s/p C3-7 PCDF. PMHx: myeloma, nxiety, depression, arthritis, and back surgery T11-T12.   Clinical Impression   Pt PTA: living with spouse at home, supportive and assisting as needed. Pt using standard walker as needed. Pt performing ADL functional mobility in room with supervisionA in room with RW. Pt  able to perform LB ADL with figure 4 technique and hip hiking technique. Pt performing grooming at sink for teeth brushing with 2 cups to reduce bending at sink. Overall pt is supervisionA for ADL tasks.  Back handout provided and reviewed ADL in detail. Pt educated on: clothing between brace, never sleep in brace, set an alarm at night for medication, avoid sitting for long periods of time, correct bed positioning for sleeping, correct sequence for bed mobility, avoiding lifting more than 5 pounds and never wash directly over incision. All education is complete and patient indicates understanding. Pt could benefit from continued OT skilled services if pt and spouse find applicable. OT signing off.      Follow Up Recommendations  Home health OT;Supervision - Intermittent(Spouse in room for eval; pt may not require HHOT.)    Equipment Recommendations  None recommended by OT    Recommendations for Other Services       Precautions / Restrictions Precautions Precautions: Cervical Precaution Booklet Issued: Yes (comment) Precaution Comments: verbally discussed Required Braces or Orthoses: Cervical Brace Cervical Brace: Hard collar;At all times Restrictions Weight Bearing Restrictions: No      Mobility Bed Mobility Overal bed mobility: Needs Assistance Bed Mobility: Rolling;Sidelying to Sit;Sit to Sidelying Rolling: Supervision Sidelying to sit: Supervision     Sit to sidelying: Min guard General bed  mobility comments: stability for BLEs to reach bed  Transfers Overall transfer level: Needs assistance Equipment used: Rolling walker (2 wheeled) Transfers: Sit to/from Stand Sit to Stand: Supervision         General transfer comment: SupervisionA for stability    Balance Overall balance assessment: Mild deficits observed, not formally tested                                         ADL either performed or assessed with clinical judgement   ADL Overall ADL's : At baseline                                       General ADL Comments: Pt able to perform LB ADL with figure 4 technique and hip hiking technique. Pt performing grooming at sink for teeth brushing with 2 cups for reduce bending at sink.     Vision Baseline Vision/History: No visual deficits Vision Assessment?: No apparent visual deficits     Perception     Praxis      Pertinent Vitals/Pain Pain Assessment: Faces Faces Pain Scale: Hurts a little bit Pain Location: neck Pain Descriptors / Indicators: Discomfort Pain Intervention(s): Monitored during session     Hand Dominance Right   Extremity/Trunk Assessment Upper Extremity Assessment Upper Extremity Assessment: Generalized weakness   Lower Extremity Assessment Lower Extremity Assessment: Generalized weakness   Cervical / Trunk Assessment Cervical / Trunk Assessment: Other exceptions Cervical / Trunk Exceptions: s/p cervical sx  Communication Communication Communication: No difficulties   Cognition Arousal/Alertness: Awake/alert Behavior During Therapy: WFL for tasks assessed/performed Overall Cognitive Status: Within Functional Limits for tasks assessed                                     General Comments  Spouse present for session and saw bed mobility, dressing and understanding her precautions well.    Exercises     Shoulder Instructions      Home Living Family/patient expects to be  discharged to:: Private residence Living Arrangements: Spouse/significant other Available Help at Discharge: Family;Available 24 hours/day Type of Home: House Home Access: Ramped entrance     Home Layout: Two level;Full bath on main level;Able to live on main level with bedroom/bathroom Alternate Level Stairs-Number of Steps: full flight   Bathroom Shower/Tub: Occupational psychologist: Standard     Home Equipment: Walker - standard;Shower seat          Prior Functioning/Environment Level of Independence: Independent with assistive device(s)                 OT Problem List: Decreased safety awareness;Decreased activity tolerance      OT Treatment/Interventions:      OT Goals(Current goals can be found in the care plan section)    OT Frequency:     Barriers to D/C:            Co-evaluation              AM-PAC OT "6 Clicks" Daily Activity     Outcome Measure Help from another person eating meals?: None Help from another person taking care of personal grooming?: A Little Help from another person toileting, which includes using toliet, bedpan, or urinal?: A Little Help from another person bathing (including washing, rinsing, drying)?: A Little Help from another person to put on and taking off regular upper body clothing?: A Little Help from another person to put on and taking off regular lower body clothing?: A Little 6 Click Score: 19   End of Session Equipment Utilized During Treatment: Cervical collar Nurse Communication: Mobility status  Activity Tolerance: Patient tolerated treatment well Patient left: in chair;with call bell/phone within reach;with family/visitor present                   Time: 0742-0830 OT Time Calculation (min): 48 min Charges:  OT General Charges $OT Visit: 1 Visit OT Evaluation $OT Eval Moderate Complexity: 1 Mod OT Treatments $Self Care/Home Management : 23-37 mins  Ebony Hail Harold Hedge) Marsa Aris OTR/L Acute  Rehabilitation Services Pager: [phone] Office: [place] 04/30/2019, 8:58 AM

## 2019-05-01 DIAGNOSIS — M4322 Fusion of spine, cervical region: Secondary | ICD-10-CM | POA: Diagnosis not present

## 2019-05-03 ENCOUNTER — Encounter (HOSPITAL_COMMUNITY): Payer: Self-pay | Admitting: Orthopedic Surgery

## 2019-05-04 DIAGNOSIS — M4322 Fusion of spine, cervical region: Secondary | ICD-10-CM | POA: Diagnosis not present

## 2019-05-04 MED FILL — Sodium Chloride IV Soln 0.9%: INTRAVENOUS | Qty: 2000 | Status: AC

## 2019-05-04 MED FILL — Heparin Sodium (Porcine) Inj 1000 Unit/ML: INTRAMUSCULAR | Qty: 30 | Status: AC

## 2019-05-05 NOTE — Discharge Summary (Signed)
Patient ID: Debra Ruiz MRN: [id number] DOB/AGE: 73-Jun-1947 73 y.o.  Admit date: 04/29/2019 Discharge date: 04/30/2019  Admission Diagnoses:  Active Problems:   Myelopathy [facility])   Discharge Diagnoses:  Same  Past Medical History:  Diagnosis Date  . Anemia   . Anxiety   . Arthritis   . Cataracts, bilateral    removed by surgery  . Depression   . Dry eyes   . Goals of care, counseling/discussion 08/28/2016  . Hearing loss    bilateral - no hearing aids  . History of radiation therapy 09/04/16-09/25/16   spine T11-S3 30 Gy in 15 fractions  . Hyperlipidemia   . Hypertension   . Insomnia   . Iron deficiency anemia due to chronic blood loss 12/03/2018  . Iron malabsorption 12/03/2018  . Multiple myeloma ([place]) 08/22/2016   current chemo treatment  . Palpitations   . Skin cancer    mohs - surgery  . Vaginal dryness     Surgeries: Procedure(s): CERVICAL THREE- CERVICAL SEVEN POSTERIOR CERVICAL DECOMPRESSION FUSION WITH INSTRUMENTATION AND ALLOGRAFT on 04/29/2019   Consultants: None  Discharged Condition: Improved  Hospital Course: Debra Ruiz is an 73 y.o. female who was admitted 04/29/2019 for operative treatment of myelopathy. Patient has severe unremitting pain that affects sleep, daily activities, and work/hobbies. After pre-op clearance the patient was taken to the operating room on 04/29/2019 and underwent  Procedure(s): CERVICAL THREE- [place] WITH INSTRUMENTATION AND ALLOGRAFT.    Patient was given perioperative antibiotics:  Anti-infectives (From admission, onward)   Start     Dose/Rate Route Frequency Ordered Stop   04/30/19 1000  famciclovir [facility]) tablet 250 mg  Status:  Discontinued     250 mg Oral Daily 04/29/19 1415 04/30/19 1352   04/29/19 1430  ceFAZolin (ANCEF) IVPB 2g/100 mL premix     2 g 200 mL/hr over 30 Minutes Intravenous Every 8 hours 04/29/19 1415 04/29/19 2242   04/29/19 0715  vancomycin  (VANCOCIN) IVPB 1000 mg/200 mL premix     1,000 mg 200 mL/hr over 60 Minutes Intravenous  Once 04/29/19 0704 04/29/19 0810   04/29/19 0700  ceFAZolin (ANCEF) IVPB 2g/100 mL premix     2 g 200 mL/hr over 30 Minutes Intravenous On call to O.R. 04/29/19 0488 04/29/19 0820   04/29/19 0651  ceFAZolin (ANCEF) 2-4 GM/100ML-% IVPB    Note to Pharmacy: Carolynn Comment, Tammy   : cabinet override      04/29/19 0651 04/29/19 0750       Patient was given sequential compression devices, early ambulation to prevent DVT.  Patient benefited maximally from hospital stay and there were no complications.    Recent vital signs: BP (!) 154/70 (BP Location: Left Arm)   Pulse 79   Temp 97.7 F (36.5 C) (Oral)   Resp 16   Ht 5' (1.524 m)   Wt 51.3 kg   LMP  (LMP Unknown)   SpO2 100%   BMI 22.11 kg/m    Discharge Medications:   Allergies as of 04/30/2019      Reactions   Crestor [rosuvastatin Calcium]    Causes liver functions to be elevated      Medication List    TAKE these medications   ALPRAZolam 0.25 MG tablet Commonly known as: XANAX Take 1 tablet (0.25 mg total) by mouth 2 (two) times daily as needed. for anxiety   amLODipine 5 MG tablet Commonly known as: NORVASC TAKE 1 TABLET(5 MG) BY MOUTH  DAILY What changed: See the new instructions.   aspirin 81 MG EC tablet Take 81 mg by mouth daily.   atorvastatin 20 MG tablet Commonly known as: LIPITOR TAKE 1 TABLET(20 MG) BY MOUTH DAILY What changed: See the new instructions.   buPROPion 300 MG 24 hr tablet Commonly known as: Wellbutrin XL Take 1 tablet (300 mg total) by mouth daily.   dexamethasone 4 MG tablet Commonly known as: DECADRON TAKE 3 TABLETS BY MOUTH 1 TIME A WEEK   diazepam 5 MG tablet Commonly known as: VALIUM Take 1 tablet (5 mg total) by mouth every 6 (six) hours as needed for muscle spasms.   escitalopram 20 MG tablet Commonly known as: LEXAPRO TAKE 1 TABLET(20 MG) BY MOUTH DAILY What changed: additional  instructions   famciclovir 250 MG tablet Commonly known as: FAMVIR TAKE 1 TABLET(250 MG) BY MOUTH DAILY What changed: See the new instructions.   ferrous sulfate 325 (65 FE) MG tablet Take 650 mg by mouth daily with breakfast.   oxyCODONE-acetaminophen 5-325 MG tablet Commonly known as: PERCOCET/ROXICET Take 1-2 tablets by mouth every 4 (four) hours as needed for moderate pain or severe pain.   potassium chloride SA 20 MEQ tablet Commonly known as: KLOR-CON TAKE 1 TABLET(20 MEQ) BY MOUTH DAILY What changed: See the new instructions.   prochlorperazine 10 MG tablet Commonly known as: COMPAZINE TAKE 1 TABLET(10 MG) BY MOUTH EVERY 6 HOURS AS NEEDED FOR NAUSEA OR VOMITING What changed: See the new instructions.   Vitamin D 50 MCG (2000 UT) Caps Take 2,000 Units by mouth daily.       Diagnostic Studies: Dg Cervical Spine 1 View  Result Date: 04/29/2019 CLINICAL DATA:  Intraoperative localization EXAM: DG CERVICAL SPINE - 1 VIEW COMPARISON:  03/22/2019 FINDINGS: Surgical instrument is noted posteriorly along the inferior aspect of the C4 spinous process. Multilevel degenerative changes are noted. IMPRESSION: Intraoperative localization at C4-5. Electronically Signed   By: Inez Catalina M.D.   On: 04/29/2019 12:01   Dg Cervical Spine 2-3 Views  Result Date: 04/29/2019 CLINICAL DATA:  73 year old female undergoing cervical spine surgery. EXAM: DG C-ARM 1-60 MIN; CERVICAL SPINE - 2-3 VIEW FLUOROSCOPY TIME:  Fluoroscopy Time:  0 minutes 3 seconds Radiation Exposure Index (if provided by the fluoroscopic device): Number of Acquired Spot Images: 0 COMPARISON:  Cervical spine radiographs 08/19/2016. FINDINGS: Two intraoperative fluoroscopic spot views of the cervical spine in the AP and lateral projection. On the lateral view there is C3 through C5 level lamina/facet screws and these are connected via posterior rods to C7 pedicle screws. Chronically advanced widespread cervical disc and  endplate degeneration. IMPRESSION: Posterior cervical fusion hardware C3 through C7. Electronically Signed   By: Genevie Ann M.D.   On: 04/29/2019 15:04   Dg C-arm 1-60 Min  Result Date: 04/29/2019 CLINICAL DATA:  73 year old female undergoing cervical spine surgery. EXAM: DG C-ARM 1-60 MIN; CERVICAL SPINE - 2-3 VIEW FLUOROSCOPY TIME:  Fluoroscopy Time:  0 minutes 3 seconds Radiation Exposure Index (if provided by the fluoroscopic device): Number of Acquired Spot Images: 0 COMPARISON:  Cervical spine radiographs 08/19/2016. FINDINGS: Two intraoperative fluoroscopic spot views of the cervical spine in the AP and lateral projection. On the lateral view there is C3 through C5 level lamina/facet screws and these are connected via posterior rods to C7 pedicle screws. Chronically advanced widespread cervical disc and endplate degeneration. IMPRESSION: Posterior cervical fusion hardware C3 through C7. Electronically Signed   By: Genevie Ann M.D.   On:  04/29/2019 15:04    Disposition:     POD #1 s/p PCDF C3-C7, doing well  - encourage ambulation - Percocet for pain, Valium for muscle spasms -Scripts for pain sent to pharmacy electronically  -D/C instructions sheet printed and in chart -D/C today  -F/U in office 2 weeks   Signed: Kayla J McKenzie 05/05/2019, 1:23 PM       

## 2019-05-07 ENCOUNTER — Ambulatory Visit (INDEPENDENT_AMBULATORY_CARE_PROVIDER_SITE_OTHER): Payer: PPO | Admitting: Internal Medicine

## 2019-05-07 ENCOUNTER — Other Ambulatory Visit: Payer: Self-pay

## 2019-05-07 ENCOUNTER — Encounter: Payer: Self-pay | Admitting: Internal Medicine

## 2019-05-07 VITALS — BP 107/67 | Ht <= 58 in | Wt 115.0 lb

## 2019-05-07 DIAGNOSIS — F32A Depression, unspecified: Secondary | ICD-10-CM

## 2019-05-07 DIAGNOSIS — I10 Essential (primary) hypertension: Secondary | ICD-10-CM | POA: Diagnosis not present

## 2019-05-07 DIAGNOSIS — Z981 Arthrodesis status: Secondary | ICD-10-CM | POA: Diagnosis not present

## 2019-05-07 DIAGNOSIS — F419 Anxiety disorder, unspecified: Secondary | ICD-10-CM | POA: Diagnosis not present

## 2019-05-07 DIAGNOSIS — F329 Major depressive disorder, single episode, unspecified: Secondary | ICD-10-CM

## 2019-05-07 DIAGNOSIS — C9002 Multiple myeloma in relapse: Secondary | ICD-10-CM | POA: Diagnosis not present

## 2019-05-12 DIAGNOSIS — Z9889 Other specified postprocedural states: Secondary | ICD-10-CM | POA: Diagnosis not present

## 2019-05-13 ENCOUNTER — Other Ambulatory Visit: Payer: PPO | Admitting: Internal Medicine

## 2019-05-18 ENCOUNTER — Encounter: Payer: PPO | Admitting: Internal Medicine

## 2019-05-21 ENCOUNTER — Encounter: Payer: Self-pay | Admitting: Internal Medicine

## 2019-05-21 NOTE — Patient Instructions (Signed)
It was a pleasure to see you virtually and see that you are doing well status post surgery and with your myeloma.  Keep appointment for follow-up here in November for physical examination and labs.

## 2019-05-21 NOTE — Progress Notes (Signed)
   Subjective:    Patient ID: Debra Ruiz, female    DOB: [birth year], 73 y.o.   MRN: [id number]  HPI 73 year old Female seen today via interactive audio and video telecommunications due to the coronavirus pandemic.  She is identified using 2 identifiers as Debra Ruiz, a patient in this practice and she is agreeable to visit in this format today.  Patient is status post surgery by Dr. Lynann B[place]na for cervical myelopathy.  She has spinal cord compression spanning from C3-C7 and had posterior spinal fusion C3-C4, C4-C5, C5-C6 and C6-C7.  Patient was hospitalized for 1 night.  She is now at home and is in a cervical brace.  She also has a history of multiple myeloma and is followed by Dr. Marin Ruiz.  She receives Velcade infusions per Dr. Marin Ruiz and the last one was September 30.  She also receives [place] every 3 months.  She has a history of hypertension treated with amlodipine.  She takes generic Lipitor for hyperlipidemia.  Take Xanax twice daily as needed for anxiety and is on Wellbutrin.  She is also on Lexapro.  She takes a potassium supplement.  She takes vitamin D supplementation.  She also takes Famvir daily.  Her spirits are good.  She is adjusting to the cervical brace.  Appetite fairly good.  No issues with bladder or bowels.  She has an appointment here for annual Medicare wellness visit and CPE in November.   Review of Systems see above     Objective:   Physical Exam She did not take vital signs today.  She is seen virtually in no acute distress in cervical brace C3-C7  Multiple myeloma seen and treated by Dr. Marin Ruiz and stable  Essential hypertension-patient reports blood pressure to be stable at home  Hyperlipidemia  Anxiety state treated with Xanax  Depression treated with Wellbutrin and Lexapro  Plan: She has appointment here for Medicare wellness visit and annual exam in November.       Assessment & Plan:  Status post multiple cervical level fusion by Dr.  Secundino Ginger

## 2019-06-02 ENCOUNTER — Inpatient Hospital Stay: Payer: PPO | Attending: Hematology & Oncology

## 2019-06-02 ENCOUNTER — Inpatient Hospital Stay: Payer: PPO

## 2019-06-02 ENCOUNTER — Inpatient Hospital Stay (HOSPITAL_BASED_OUTPATIENT_CLINIC_OR_DEPARTMENT_OTHER): Payer: PPO | Admitting: Hematology & Oncology

## 2019-06-02 ENCOUNTER — Other Ambulatory Visit: Payer: Self-pay

## 2019-06-02 VITALS — BP 152/70 | HR 68 | Temp 97.4°F | Resp 20 | Wt 113.8 lb

## 2019-06-02 DIAGNOSIS — C9000 Multiple myeloma not having achieved remission: Secondary | ICD-10-CM

## 2019-06-02 DIAGNOSIS — D509 Iron deficiency anemia, unspecified: Secondary | ICD-10-CM | POA: Diagnosis not present

## 2019-06-02 DIAGNOSIS — Z5112 Encounter for antineoplastic immunotherapy: Secondary | ICD-10-CM | POA: Insufficient documentation

## 2019-06-02 LAB — CMP (CANCER CENTER ONLY)
ALT: 20 U/L (ref 0–44)
AST: 21 U/L (ref 15–41)
Albumin: 4.7 g/dL (ref 3.5–5.0)
Alkaline Phosphatase: 67 U/L (ref 38–126)
Anion gap: 8 (ref 5–15)
BUN: 16 mg/dL (ref 8–23)
CO2: 27 mmol/L (ref 22–32)
Calcium: 8.9 mg/dL (ref 8.9–10.3)
Chloride: 106 mmol/L (ref 98–111)
Creatinine: 0.74 mg/dL (ref 0.44–1.00)
GFR, Est AFR Am: >60 mL/min (ref >60)
GFR, Estimated: >60 mL/min (ref >60)
Glucose, Bld: 140 mg/dL — ABNORMAL HIGH (ref 70–99)
Potassium: 4.2 mmol/L (ref 3.5–5.1)
Sodium: 141 mmol/L (ref 135–145)
Total Bilirubin: 0.5 mg/dL (ref 0.3–1.2)
Total Protein: 6.2 g/dL — ABNORMAL LOW (ref 6.5–8.1)

## 2019-06-02 LAB — CBC WITH DIFFERENTIAL (CANCER CENTER ONLY)
Abs Immature Granulocytes: 0.02 10*3/uL (ref 0.00–0.07)
Basophils Absolute: 0 10*3/uL (ref 0.0–0.1)
Basophils Relative: 1 %
Eosinophils Absolute: 0.1 10*3/uL (ref 0.0–0.5)
Eosinophils Relative: 2 %
HCT: 42.3 % (ref 36.0–46.0)
Hemoglobin: 14.1 g/dL (ref 12.0–15.0)
Immature Granulocytes: 0 %
Lymphocytes Relative: 24 %
Lymphs Abs: 1.5 10*3/uL (ref 0.7–4.0)
MCH: 33.7 pg (ref 26.0–34.0)
MCHC: 33.3 g/dL (ref 30.0–36.0)
MCV: 101 fL — ABNORMAL HIGH (ref 80.0–100.0)
Monocytes Absolute: 0.6 10*3/uL (ref 0.1–1.0)
Monocytes Relative: 10 %
Neutro Abs: 3.9 10*3/uL (ref 1.7–7.7)
Neutrophils Relative %: 63 %
Platelet Count: 250 10*3/uL (ref 150–400)
RBC: 4.19 MIL/uL (ref 3.87–5.11)
RDW: 12.5 % (ref 11.5–15.5)
WBC Count: 6.1 10*3/uL (ref 4.0–10.5)
nRBC: 0 % (ref 0.0–0.2)

## 2019-06-02 MED ORDER — BORTEZOMIB CHEMO SQ INJECTION 3.5 MG (2.5MG/ML)
1.3000 mg/m2 | Freq: Once | INTRAMUSCULAR | Status: AC
  Administered 2019-06-02: 2 mg via SUBCUTANEOUS
  Filled 2019-06-02: qty 0.8

## 2019-06-02 MED ORDER — PROCHLORPERAZINE MALEATE 10 MG PO TABS: 10.0000 mg | ORAL_TABLET | Freq: Once | ORAL | Status: DC

## 2019-06-02 NOTE — Progress Notes (Signed)
Hematology and Oncology Follow Up Visit  Debra Ruiz [id number] 15-Feb-1946 73 y.o. 06/02/2019   Principle Diagnosis:  IgG Kappa myeloma - compression fractures - 13q-, +11, 14q+ - hyperdiploid -- progressive Iron def anemia  Completed Therapy: S/p Kyphoplasty of T11 and T12 XRT to lower spine-completed on 09/25/2016  Current Therapy:   Velcade/Pomalyst/Decadron -Start on 09/02/2018 --Pomalyst on hold since 11/09/2018. Xgeva 132m Sq q  3 month - next dose in November 2020 IV Injectafer -- last dose given on 11/2018   Interim History:  Ms. JBartosiewiczis here today for follow-up.  She actually is looking pretty good.  She had her neck surgery about 4 weeks ago.  She had fusion of C3-C7.  She still has 2 more weeks in the hard neck collar and then will be placed in a soft collar.  When we last her myeloma studies in September, there is no monoclonal spike in her blood.  Her IgG level was 294 mg/dL.  Her kappa light chain was 0.6 mg/dL.  She has gained little bit of weight.  She just is not exercising as much.  She is having no problems with going to the bathroom.  There is no weakness in the arms or legs.  She has little bit of numbness in the right foot.  Overall, her performance status is ECOG 1.     Medications:  Allergies as of 06/02/2019      Reactions   Crestor [rosuvastatin Calcium]    Causes liver functions to be elevated      Medication List       Accurate as of June 02, 2019  3:04 PM. If you have any questions, ask your nurse or doctor.        ALPRAZolam 0.25 MG tablet Commonly known as: XANAX Take 1 tablet (0.25 mg total) by mouth 2 (two) times daily as needed. for anxiety   amLODipine 5 MG tablet Commonly known as: NORVASC TAKE 1 TABLET(5 MG) BY MOUTH DAILY What changed: See the new instructions.   aspirin 81 MG EC tablet Take 81 mg by mouth daily.   atorvastatin 20 MG tablet Commonly known as: LIPITOR TAKE 1 TABLET(20 MG) BY MOUTH DAILY What  changed: See the new instructions.   buPROPion 300 MG 24 hr tablet Commonly known as: Wellbutrin XL Take 1 tablet (300 mg total) by mouth daily.   dexamethasone 4 MG tablet Commonly known as: DECADRON TAKE 3 TABLETS BY MOUTH 1 TIME A WEEK   diazepam 5 MG tablet Commonly known as: VALIUM Take 1 tablet (5 mg total) by mouth every 6 (six) hours as needed for muscle spasms.   escitalopram 20 MG tablet Commonly known as: LEXAPRO TAKE 1 TABLET(20 MG) BY MOUTH DAILY What changed: additional instructions   famciclovir 250 MG tablet Commonly known as: FAMVIR TAKE 1 TABLET(250 MG) BY MOUTH DAILY What changed: See the new instructions.   ferrous sulfate 325 (65 FE) MG tablet Take 650 mg by mouth daily with breakfast.   oxyCODONE-acetaminophen 5-325 MG tablet Commonly known as: PERCOCET/ROXICET Take 1-2 tablets by mouth every 4 (four) hours as needed for moderate pain or severe pain.   potassium chloride SA 20 MEQ tablet Commonly known as: KLOR-CON TAKE 1 TABLET(20 MEQ) BY MOUTH DAILY What changed: See the new instructions.   prochlorperazine 10 MG tablet Commonly known as: COMPAZINE TAKE 1 TABLET(10 MG) BY MOUTH EVERY 6 HOURS AS NEEDED FOR NAUSEA OR VOMITING What changed: See the new instructions.   Vitamin D  50 MCG (2000 UT) Caps Take 2,000 Units by mouth daily.       Allergies:  Allergies  Allergen Reactions  . Crestor [Rosuvastatin Calcium]     Causes liver functions to be elevated    Past Medical History, Surgical history, Social history, and Family History were reviewed and updated.  Review of Systems: Review of Systems  Constitutional: Negative.   HENT: Negative.   Eyes: Negative.   Respiratory: Negative.   Cardiovascular: Negative.   Gastrointestinal: Negative.   Genitourinary: Negative.   Musculoskeletal: Positive for back pain.  Skin: Negative.   Neurological: Negative.   Endo/Heme/Allergies: Negative.   Psychiatric/Behavioral: Negative.      Physical Exam:  vitals were not taken for this visit.   Wt Readings from Last 3 Encounters:  05/07/19 115 lb (52.2 kg)  04/29/19 113 lb 3.2 oz (51.3 kg)  04/23/19 113 lb 3.2 oz (51.3 kg)    Physical Exam Vitals signs reviewed.  HENT:     Head: Normocephalic and atraumatic.  Eyes:     Pupils: Pupils are equal, round, and reactive to light.  Neck:     Musculoskeletal: Normal range of motion.  Cardiovascular:     Rate and Rhythm: Normal rate and regular rhythm.     Heart sounds: Normal heart sounds.  Pulmonary:     Effort: Pulmonary effort is normal.     Breath sounds: Normal breath sounds.  Abdominal:     General: Bowel sounds are normal.     Palpations: Abdomen is soft.  Musculoskeletal: Normal range of motion.        General: No tenderness or deformity.  Lymphadenopathy:     Cervical: No cervical adenopathy.  Skin:    General: Skin is warm and dry.     Findings: No erythema or rash.  Neurological:     Mental Status: She is alert and oriented to person, place, and time.  Psychiatric:        Behavior: Behavior normal.        Thought Content: Thought content normal.        Judgment: Judgment normal.       Lab Results  Component Value Date   WBC 6.1 06/02/2019   HGB 14.1 06/02/2019   HCT 42.3 06/02/2019   MCV 101.0 (H) 06/02/2019   PLT 250 06/02/2019   Lab Results  Component Value Date   FERRITIN 219 04/21/2019   IRON 181 (H) 04/21/2019   TIBC 285 04/21/2019   UIBC 104 (L) 04/21/2019   IRONPCTSAT 64 (H) 04/21/2019   Lab Results  Component Value Date   RBC 4.19 06/02/2019   Lab Results  Component Value Date   KPAFRELGTCHN 6.4 04/21/2019   LAMBDASER 4.0 (L) 04/21/2019   KAPLAMBRATIO 1.60 04/21/2019   Lab Results  Component Value Date   IGGSERUM 294 (L) 04/21/2019   IGA 25 (L) 04/21/2019   IGMSERUM 24 (L) 04/21/2019   Lab Results  Component Value Date   TOTALPROTELP 6.0 04/21/2019   ALBUMINELP 3.8 04/21/2019   A1GS 0.2 04/21/2019   A2GS 0.8  04/21/2019   BETS 0.9 04/21/2019   GAMS 0.3 (L) 04/21/2019   MSPIKE Not Observed 04/21/2019   SPEI Comment 01/28/2018     Chemistry      Component Value Date/Time   NA 141 06/02/2019 1346   NA 148 (H) 07/16/2017 1128   NA 143 03/05/2017 1051   K 4.2 06/02/2019 1346   K 3.8 07/16/2017 1128   K 3.7 03/05/2017 1051  CL 106 06/02/2019 1346   CL 106 07/16/2017 1128   CO2 27 06/02/2019 1346   CO2 29 07/16/2017 1128   CO2 25 03/05/2017 1051   BUN 16 06/02/2019 1346   BUN 23 (H) 07/16/2017 1128   BUN 18.2 03/05/2017 1051   CREATININE 0.74 06/02/2019 1346   CREATININE 0.69 05/07/2018 1233   CREATININE 0.8 03/05/2017 1051      Component Value Date/Time   CALCIUM 8.9 06/02/2019 1346   CALCIUM 9.7 07/16/2017 1128   CALCIUM 8.9 03/05/2017 1051   ALKPHOS 67 06/02/2019 1346   ALKPHOS 71 07/16/2017 1128   ALKPHOS 67 03/05/2017 1051   AST 21 06/02/2019 1346   AST 15 03/05/2017 1051   ALT 20 06/02/2019 1346   ALT 23 07/16/2017 1128   ALT 17 03/05/2017 1051   BILITOT 0.5 06/02/2019 1346   BILITOT 0.55 03/05/2017 1051         Impression and Plan: Ms. Koskela is a very pleasant 73 yo caucasian female with IgG Kappa Myeloma.    I am glad to see that she did well with her neck surgery.  Hopefully she will continue to improve.  I would like to hope that her myeloma still under very good control.  We will plan to get her back to see [place] in another month.  I will plan to get her back on schedule now that she has recovered from her neck surgery.   [name], MD 11/11/20203:04 PM

## 2019-06-02 NOTE — Patient Instructions (Signed)
Bortezomib injection What is this medicine? BORTEZOMIB (bor TEZ oh mib) is a medicine that targets proteins in cancer cells and stops the cancer cells from growing. It is used to treat multiple myeloma and mantle-cell lymphoma. This medicine may be used for other purposes; ask your health care provider or pharmacist if you have questions. COMMON BRAND NAME(S): Velcade What should I tell my health care provider before I take this medicine? They need to know if you have any of these conditions:  diabetes  heart disease  irregular heartbeat  liver disease  on hemodialysis  low blood counts, like low white blood cells, platelets, or hemoglobin  peripheral neuropathy  taking medicine for blood pressure  an unusual or allergic reaction to bortezomib, mannitol, boron, other medicines, foods, dyes, or preservatives  pregnant or trying to get pregnant  breast-feeding How should I use this medicine? This medicine is for injection into a vein or for injection under the skin. It is given by a health care professional in a hospital or clinic setting. Talk to your pediatrician regarding the use of this medicine in children. Special care may be needed. Overdosage: If you think you have taken too much of this medicine contact a poison control center or emergency room at once. NOTE: This medicine is only for you. Do not share this medicine with others. What if I miss a dose? It is important not to miss your dose. Call your doctor or health care professional if you are unable to keep an appointment. What may interact with this medicine? This medicine may interact with the following medications:  ketoconazole  rifampin  ritonavir  St. John's Wort This list may not describe all possible interactions. Give your health care provider a list of all the medicines, herbs, non-prescription drugs, or dietary supplements you use. Also tell them if you smoke, drink alcohol, or use illegal drugs. Some  items may interact with your medicine. What should I watch for while using this medicine? You may get drowsy or dizzy. Do not drive, use machinery, or do anything that needs mental alertness until you know how this medicine affects you. Do not stand or sit up quickly, especially if you are an older patient. This reduces the risk of dizzy or fainting spells. In some cases, you may be given additional medicines to help with side effects. Follow all directions for their use. Call your doctor or health care professional for advice if you get a fever, chills or sore throat, or other symptoms of a cold or flu. Do not treat yourself. This drug decreases your body's ability to fight infections. Try to avoid being around people who are sick. This medicine may increase your risk to bruise or bleed. Call your doctor or health care professional if you notice any unusual bleeding. You may need blood work done while you are taking this medicine. In some patients, this medicine may cause a serious brain infection that may cause death. If you have any problems seeing, thinking, speaking, walking, or standing, tell your doctor right away. If you cannot reach your doctor, urgently seek other source of medical care. Check with your doctor or health care professional if you get an attack of severe diarrhea, nausea and vomiting, or if you sweat a lot. The loss of too much body fluid can make it dangerous for you to take this medicine. Do not become pregnant while taking this medicine or for at least 7 months after stopping it. Women should inform their doctor   if they wish to become pregnant or think they might be pregnant. Men should not father a child while taking this medicine and for at least 4 months after stopping it. There is a potential for serious side effects to an unborn child. Talk to your health care professional or pharmacist for more information. Do not breast-feed an infant while taking this medicine or for 2  months after stopping it. This medicine may interfere with the ability to have a child. You should talk with your doctor or health care professional if you are concerned about your fertility. What side effects may I notice from receiving this medicine? Side effects that you should report to your doctor or health care professional as soon as possible:  allergic reactions like skin rash, itching or hives, swelling of the face, lips, or tongue  breathing problems  changes in hearing  changes in vision  fast, irregular heartbeat  feeling faint or lightheaded, falls  pain, tingling, numbness in the hands or feet  right upper belly pain  seizures  swelling of the ankles, feet, hands  unusual bleeding or bruising  unusually weak or tired  vomiting  yellowing of the eyes or skin Side effects that usually do not require medical attention (report to your doctor or health care professional if they continue or are bothersome):  changes in emotions or moods  constipation  diarrhea  loss of appetite  headache  irritation at site where injected  nausea This list may not describe all possible side effects. Call your doctor for medical advice about side effects. You may report side effects to FDA at 1-800-FDA-1088. Where should I keep my medicine? This drug is given in a hospital or clinic and will not be stored at home. NOTE: This sheet is a summary. It may not cover all possible information. If you have questions about this medicine, talk to your doctor, pharmacist, or health care provider.  2020 Elsevier/Gold Standard (2017-11-17 16:29:31)  

## 2019-06-03 LAB — KAPPA/LAMBDA LIGHT CHAINS
Kappa free light chain: 7.7 mg/L (ref 3.3–19.4)
Kappa, lambda light chain ratio: 2.08 — ABNORMAL HIGH (ref 0.26–1.65)
Lambda free light chains: 3.7 mg/L — ABNORMAL LOW (ref 5.7–26.3)

## 2019-06-03 LAB — IGG, IGA, IGM
IgA: 27 mg/dL — ABNORMAL LOW (ref 64–422)
IgG (Immunoglobin G), Serum: 332 mg/dL — ABNORMAL LOW (ref 586–1602)
IgM (Immunoglobulin M), Srm: 26 mg/dL (ref 26–217)

## 2019-06-08 DIAGNOSIS — Z9889 Other specified postprocedural states: Secondary | ICD-10-CM | POA: Diagnosis not present

## 2019-06-08 DIAGNOSIS — M542 Cervicalgia: Secondary | ICD-10-CM | POA: Diagnosis not present

## 2019-06-08 LAB — PROTEIN ELECTROPHORESIS, SERUM, WITH REFLEX
A/G Ratio: 1.7 (ref 0.7–1.7)
Albumin ELP: 3.8 g/dL (ref 2.9–4.4)
Alpha-1-Globulin: 0.3 g/dL (ref 0.0–0.4)
Alpha-2-Globulin: 0.7 g/dL (ref 0.4–1.0)
Beta Globulin: 0.9 g/dL (ref 0.7–1.3)
Gamma Globulin: 0.3 g/dL — ABNORMAL LOW (ref 0.4–1.8)
Globulin, Total: 2.2 g/dL (ref 2.2–3.9)
M-Spike, %: 0.2 g/dL — ABNORMAL HIGH
SPEP Interpretation: 0
Total Protein ELP: 6 g/dL (ref 6.0–8.5)

## 2019-06-08 LAB — IMMUNOFIXATION REFLEX, SERUM
IgA: 29 mg/dL — ABNORMAL LOW (ref 64–422)
IgG (Immunoglobin G), Serum: 372 mg/dL — ABNORMAL LOW (ref 586–1602)
IgM (Immunoglobulin M), Srm: 29 mg/dL (ref 26–217)

## 2019-06-14 ENCOUNTER — Other Ambulatory Visit: Payer: Self-pay

## 2019-06-14 ENCOUNTER — Other Ambulatory Visit: Payer: PPO | Admitting: Internal Medicine

## 2019-06-14 DIAGNOSIS — C9002 Multiple myeloma in relapse: Secondary | ICD-10-CM | POA: Diagnosis not present

## 2019-06-14 DIAGNOSIS — Z0000 Encounter for general adult medical examination without abnormal findings: Secondary | ICD-10-CM | POA: Diagnosis not present

## 2019-06-14 DIAGNOSIS — Z13220 Encounter for screening for lipoid disorders: Secondary | ICD-10-CM

## 2019-06-14 DIAGNOSIS — I10 Essential (primary) hypertension: Secondary | ICD-10-CM | POA: Diagnosis not present

## 2019-06-14 DIAGNOSIS — E7800 Pure hypercholesterolemia, unspecified: Secondary | ICD-10-CM | POA: Diagnosis not present

## 2019-06-14 DIAGNOSIS — F32A Depression, unspecified: Secondary | ICD-10-CM

## 2019-06-14 DIAGNOSIS — F419 Anxiety disorder, unspecified: Secondary | ICD-10-CM

## 2019-06-14 DIAGNOSIS — F329 Major depressive disorder, single episode, unspecified: Secondary | ICD-10-CM

## 2019-06-14 DIAGNOSIS — R7989 Other specified abnormal findings of blood chemistry: Secondary | ICD-10-CM

## 2019-06-15 LAB — COMPLETE METABOLIC PANEL WITH GFR
AG Ratio: 2.3 (calc) (ref 1.0–2.5)
ALT: 11 U/L (ref 6–29)
AST: 13 U/L (ref 10–35)
Albumin: 4.2 g/dL (ref 3.6–5.1)
Alkaline phosphatase (APISO): 71 U/L (ref 37–153)
BUN: 12 mg/dL (ref 7–25)
CO2: 26 mmol/L (ref 20–32)
Calcium: 8.9 mg/dL (ref 8.6–10.4)
Chloride: 106 mmol/L (ref 98–110)
Creat: 0.72 mg/dL (ref 0.60–0.93)
GFR, Est African American: 96 mL/min/{1.73_m2} (ref >=60)
GFR, Est Non African American: 83 mL/min/{1.73_m2} (ref >=60)
Globulin: 1.8 g/dL (calc) — ABNORMAL LOW (ref 1.9–3.7)
Glucose, Bld: 90 mg/dL (ref 65–99)
Potassium: 4.4 mmol/L (ref 3.5–5.3)
Sodium: 142 mmol/L (ref 135–146)
Total Bilirubin: 0.6 mg/dL (ref 0.2–1.2)
Total Protein: 6 g/dL — ABNORMAL LOW (ref 6.1–8.1)

## 2019-06-15 LAB — CBC WITH DIFFERENTIAL/PLATELET
Absolute Monocytes: 490 cells/uL (ref 200–950)
Basophils Absolute: 31 cells/uL (ref 0–200)
Basophils Relative: 0.6 %
Eosinophils Absolute: 138 cells/uL (ref 15–500)
Eosinophils Relative: 2.7 %
HCT: 41.5 % (ref 35.0–45.0)
Hemoglobin: 13.9 g/dL (ref 11.7–15.5)
Lymphs Abs: 979 cells/uL (ref 850–3900)
MCH: 33.3 pg — ABNORMAL HIGH (ref 27.0–33.0)
MCHC: 33.5 g/dL (ref 32.0–36.0)
MCV: 99.5 fL (ref 80.0–100.0)
MPV: 9.5 fL (ref 7.5–12.5)
Monocytes Relative: 9.6 %
Neutro Abs: 3463 cells/uL (ref 1500–7800)
Neutrophils Relative %: 67.9 %
Platelets: 234 10*3/uL (ref 140–400)
RBC: 4.17 10*6/uL (ref 3.80–5.10)
RDW: 12.3 % (ref 11.0–15.0)
Total Lymphocyte: 19.2 %
WBC: 5.1 10*3/uL (ref 3.8–10.8)

## 2019-06-15 LAB — LIPID PANEL
Cholesterol: 201 mg/dL — ABNORMAL HIGH (ref <200)
HDL: 78 mg/dL (ref >=50)
LDL Cholesterol (Calc): 104 mg/dL (calc) — ABNORMAL HIGH
Non-HDL Cholesterol (Calc): 123 mg/dL (calc) (ref <130)
Total CHOL/HDL Ratio: 2.6 (calc) (ref <5.0)
Triglycerides: 99 mg/dL (ref <150)

## 2019-06-15 LAB — TSH: TSH: 2.73 mIU/L (ref 0.40–4.50)

## 2019-06-21 ENCOUNTER — Other Ambulatory Visit: Payer: Self-pay

## 2019-06-21 ENCOUNTER — Encounter: Payer: Self-pay | Admitting: Internal Medicine

## 2019-06-21 ENCOUNTER — Ambulatory Visit (INDEPENDENT_AMBULATORY_CARE_PROVIDER_SITE_OTHER): Payer: PPO | Admitting: Internal Medicine

## 2019-06-21 VITALS — BP 130/70 | HR 108 | Temp 98.0°F | Ht <= 58 in | Wt 117.0 lb

## 2019-06-21 DIAGNOSIS — C9001 Multiple myeloma in remission: Secondary | ICD-10-CM | POA: Diagnosis not present

## 2019-06-21 DIAGNOSIS — E7800 Pure hypercholesterolemia, unspecified: Secondary | ICD-10-CM | POA: Diagnosis not present

## 2019-06-21 DIAGNOSIS — I10 Essential (primary) hypertension: Secondary | ICD-10-CM

## 2019-06-21 DIAGNOSIS — F329 Major depressive disorder, single episode, unspecified: Secondary | ICD-10-CM | POA: Diagnosis not present

## 2019-06-21 DIAGNOSIS — F419 Anxiety disorder, unspecified: Secondary | ICD-10-CM | POA: Diagnosis not present

## 2019-06-21 DIAGNOSIS — R54 Age-related physical debility: Secondary | ICD-10-CM | POA: Diagnosis not present

## 2019-06-21 DIAGNOSIS — Z0000 Encounter for general adult medical examination without abnormal findings: Secondary | ICD-10-CM | POA: Diagnosis not present

## 2019-06-21 DIAGNOSIS — F32A Depression, unspecified: Secondary | ICD-10-CM

## 2019-06-21 LAB — POCT URINALYSIS DIPSTICK
Appearance: NEGATIVE
Bilirubin, UA: NEGATIVE
Blood, UA: NEGATIVE
Glucose, UA: NEGATIVE
Ketones, UA: NEGATIVE
Leukocytes, UA: NEGATIVE
Nitrite, UA: NEGATIVE
Odor: NEGATIVE
Protein, UA: NEGATIVE
Spec Grav, UA: 1.01 (ref 1.010–1.025)
Urobilinogen, UA: 0.2 E.U./dL
pH, UA: 6 (ref 5.0–8.0)

## 2019-06-21 NOTE — Patient Instructions (Addendum)
Labs reviewed and are within normal limits. Stay safe and well. Remember social distancing and mask wearing. It was a pleasure to see you today.Glad you are doing so well.

## 2019-06-21 NOTE — Progress Notes (Signed)
Subjective:    Patient ID: Debra Ruiz, female    DOB: 10-13-1945, 73 y.o.   MRN: [id number]  HPI 73 year old Female for health maintenance exam, Medicare wellness, and evaluation of medical issues.  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 Kathrin Penner who did an extensive evaluation.  She was found not to have a brain tumor.  This is improved.  In August 2017 she was doing some yard work and strained her back.  Was treated conservatively with meloxicam and Flexeril.  In October 2017 she was seen with left-sided sciatica with history of spondylosis L3-L4 and moderately severe canal stenosis based on the MRI 2011 with anterior listhesis.  [place] has [place] going to the bathroom late at night September 2017.  Was complaining of pain in left buttock going down the left leg and pain in quadriceps muscle.  MRI was recommended.  MRI showed moderate compression fracture of T12.  This appears to be subacute and benign fracture.  However she was seen December 2017 with back pain after going into an attic the previous day.  Did not do any heavy lifting but was in a lot of pain.  Was thought to have musculoskeletal pain was given narcotic pain medication and prednisone Dosepak.  Patient was subsequently seen by Dr. Lynann Bologna at [facility] and was found to have multifocal marrow space lesions consistent with multiple myeloma.  She Also Had Multifactorial Lumbar Stenosis.  She Was Referred to Dr. Marin Olp Who Diagnosed Her with IgG myeloma 13 q. -+11,14 q+- hyperdiploid multiple myeloma.  She did have T11-T12 kyphoplasty done February 2018.  Was treated with chemotherapy and radiation.  She currently is on Decadron 3 tablets by mouth weekly.  She gets Xgeva every 3 months.  Underwent surgery by Dr. Lynann Bologna in October where she had a C3-C7 posterior cervical decompression fusion with instrumentation and allograft.  She has done well.  She is now able to drive.   Unable to tolerate Crestor according to old records which she was under the care of Dr. Sharene Butters.  Apparently had some increased liver functions on Crestor.  History of herpes simplex type I.  Is on Famvir.  Tubal ligation 1977.  She does have hypertension hyperlipidemia anxiety depression GE reflux insomnia.  Remote history of lumbar spondylosis.  Social history: She is married.  5 years ago but currently non-smoker.  Came to [place] in 1979.  Previously lived in [place].  Previously worked for an [employer] consultant.  Has been married 3 times.  No children.  Has a twin sister.  Family history: Father died at age 45 of an MI with hypertension.  Mother died at age 74 of a stroke with history of hypertension.  Brother with history of brain tumor, cardiac defibrillator.  Twin sister lives in [place].  Sister with history of hypertension and hyperlipidemia.  There is a strong family history of Alzheimer's disease.    Review of Systems no new complaints.  Neck soreness and stiffness is improving      Objective:   Physical Exam Vitals signs reviewed.  Constitutional:      General: She is not in acute distress.    Appearance: Normal appearance.  HENT:     Head: Normocephalic.     Right Ear: Tympanic membrane normal.     Left Ear: Tympanic membrane normal.     Nose: Nose normal.  Eyes:     Extraocular Movements: Extraocular  movements intact.     Conjunctiva/sclera: Conjunctivae normal.     Pupils: Pupils are equal, round, and reactive to light.  Neck:     Musculoskeletal: Neck supple. No neck rigidity.     Vascular: No carotid bruit.  Cardiovascular:     Rate and Rhythm: Normal rate and regular rhythm.     Heart sounds: Normal heart sounds. No murmur.     Comments: Breasts normal Female without masses Pulmonary:     Effort: Pulmonary effort is normal. No respiratory distress.     Breath sounds: Normal breath sounds. No wheezing.  Abdominal:     General: Bowel sounds  are normal.     Palpations: Abdomen is soft. There is no mass.     Tenderness: There is no abdominal tenderness.  Genitourinary:    Comments: Pap deferred. Bimanual normal. Musculoskeletal:     Right lower leg: No edema.     Left lower leg: No edema.  Lymphadenopathy:     Cervical: No cervical adenopathy.  Skin:    General: Skin is warm and dry.  Neurological:     General: No focal deficit present.     Mental Status: She is alert and oriented to person, place, and time.     Cranial Nerves: No cranial nerve deficit.     Motor: No weakness.  Psychiatric:        Mood and Affect: Mood normal.        Behavior: Behavior normal.        Thought Content: Thought content normal.        Judgment: Judgment normal.           Assessment & Plan:  Status post C3-C7 posterior cervical decompression fusion with instrumentation and allograft October 2020  History of multiple myeloma treated by Dr. Marin Olp and apparently stable  Hypertension-stable on amlodipine  Hyperlipidemia currently treated with Lipitor 20 mg daily  Anxiety depression treated with Lexapro and Xanax  GE reflux  History of insomnia  History of Herpes simplex type I-is on Famvir per Dr. Marin Olp  Labs reviewed and are very good.  No change in medications.  Recommend follow-up in 1 year or as needed.  COVID-19 precautions discussed including having dinner with friends and recent invitation to go to a restaurant indoors which I think she should avoid.  Subjective:   Patient presents for Medicare Annual/Subsequent preventive examination.  Review Past Medical/Family/Social: See above   Risk Factors  Current exercise habits: Physically not really able to exercise Dietary issues discussed: Low-fat low carbohydrate  Cardiac risk factors: Hyperlipidemia  Depression Screen  (Note: if answer to either of the following is "Yes", a more complete depression screening is indicated)   Over the past two weeks, have you  felt down, depressed or hopeless?  Yes some Over the past two weeks, have you felt little interest or pleasure in doing things?  Yes due to the pandemic Have you lost interest or pleasure in daily life? No Do you often feel hopeless? No Do you cry easily over simple problems? No   Activities of Daily Living  In your present state of health, do you have any difficulty performing the following activities?:   Driving? No  Managing money? No  Feeding yourself? No  Getting from bed to chair? No  Climbing a flight of stairs? No  Preparing food and eating?:  Yes due to my illness Bathing or showering? No  Getting dressed: No  Getting to the toilet? No  Using  the toilet:No  Moving around from place to place: Yes due to my illness In the past year have you fallen or had a near fall?:No  Are you sexually active? No  Do you have more than one partner? No   Hearing Difficulties: No  Do you often ask people to speak up or repeat themselves?  Yes Do you experience ringing or noises in your ears?  Yes Do you have difficulty understanding soft or whispered voices?  Yes Do you feel that you have a problem with memory?  Yes Do you often misplace items? No    Home Safety:  Do you have a smoke alarm at your residence? Yes Do you have grab bars in the bathroom?  Yes Do you have throw rugs in your house?  Yes   Cognitive Testing  Alert? Yes Normal Appearance?Yes  Oriented to person? Yes Place? Yes  Time? Yes  Recall of three objects? Yes  Can perform simple calculations? Yes  Displays appropriate judgment?Yes  Can read the correct time from a watch face?Yes   List the Names of Other Physician/Practitioners you currently use:  See referral list for the physicians patient is currently seeing.  Dr. Marin Olp  Dr. Lynann Bologna   Review of Systems: See above no new complaints   Objective:     General appearance: Appears stated age and frail Head: Normocephalic, without obvious  abnormality, atraumatic  Eyes: conj clear, EOMi PEERLA  Ears: normal TM's and external ear canals both ears  Nose: Nares normal. Septum midline. Mucosa normal. No drainage or sinus tenderness.  Throat: lips, mucosa, and tongue normal; teeth and gums normal  Neck: no adenopathy, no carotid bruit, no JVD, supple, symmetrical, trachea midline and thyroid not enlarged, symmetric, no tenderness/mass/nodules  No CVA tenderness.  Lungs: clear to auscultation bilaterally  Breasts: normal appearance, no masses or tenderness Heart: regular rate and rhythm, S1, S2 normal, no murmur, click, rub or gallop  Abdomen: soft, non-tender; bowel sounds normal; no masses, no organomegaly  Musculoskeletal: ROM normal in all joints, no crepitus, no deformity, Normal muscle strengthen. Back  is symmetric, no curvature. Skin: Skin color, texture, turgor normal. No rashes or lesions  Lymph nodes: Cervical, supraclavicular, and axillary nodes normal.  Neurologic: CN 2 -12 Normal, Normal symmetric reflexes. Normal coordination and gait  Psych: Alert & Oriented x 3, Mood appear stable.    Assessment:    Annual wellness medicare exam   Plan:    During the course of the visit the patient was educated and counseled about appropriate screening and preventive services including:   Immunization up-to-date.  Has had flu vaccine.     Patient Instructions (the written plan) was given to the patient.  Medicare Attestation  I have personally reviewed:  The patient's medical and social history  Their use of alcohol, tobacco or illicit drugs  Their current medications and supplements  The patient's functional ability including ADLs,fall risks, home safety risks, cognitive, and hearing and visual impairment  Diet and physical activities  Evidence for depression or mood disorders  The patient's weight, height, BMI, and visual acuity have been recorded in the chart. I have made referrals, counseling, and provided  education to the patient based on review of the above and I have provided the patient with a written personalized care plan for preventive services.

## 2019-06-30 ENCOUNTER — Telehealth: Payer: Self-pay | Admitting: Internal Medicine

## 2019-06-30 MED ORDER — POTASSIUM CHLORIDE CRYS ER 20 MEQ PO TBCR: EXTENDED_RELEASE_TABLET | ORAL | 3 refills | Status: DC

## 2019-06-30 MED ORDER — AMLODIPINE BESYLATE 5 MG PO TABS: ORAL_TABLET | ORAL | 3 refills | Status: DC

## 2019-06-30 NOTE — Telephone Encounter (Signed)
Received Fax RX request from  [facility], [place] [phone] ([phone] (Fax)     Medication - amLODipine (NORVASC) 5 MG tablet / potassium chloride SA (K-DUR,KLOR-CON) 20 MEQ table  Last Refill - 04/01/19  Last OV - 06/21/19  Last CPE - 06/21/19  Next Appointment -

## 2019-07-01 ENCOUNTER — Ambulatory Visit: Payer: PPO

## 2019-07-01 ENCOUNTER — Ambulatory Visit: Payer: PPO | Admitting: Hematology & Oncology

## 2019-07-01 ENCOUNTER — Other Ambulatory Visit: Payer: PPO

## 2019-07-05 DIAGNOSIS — H04123 Dry eye syndrome of bilateral lacrimal glands: Secondary | ICD-10-CM | POA: Diagnosis not present

## 2019-07-05 DIAGNOSIS — Z9889 Other specified postprocedural states: Secondary | ICD-10-CM | POA: Diagnosis not present

## 2019-07-05 DIAGNOSIS — Z961 Presence of intraocular lens: Secondary | ICD-10-CM | POA: Diagnosis not present

## 2019-07-12 ENCOUNTER — Inpatient Hospital Stay: Payer: PPO | Attending: Hematology & Oncology

## 2019-07-12 ENCOUNTER — Encounter: Payer: Self-pay | Admitting: Hematology & Oncology

## 2019-07-12 ENCOUNTER — Other Ambulatory Visit: Payer: Self-pay

## 2019-07-12 ENCOUNTER — Inpatient Hospital Stay: Payer: PPO

## 2019-07-12 ENCOUNTER — Inpatient Hospital Stay (HOSPITAL_BASED_OUTPATIENT_CLINIC_OR_DEPARTMENT_OTHER): Payer: PPO | Admitting: Hematology & Oncology

## 2019-07-12 VITALS — BP 137/67 | HR 100 | Temp 98.0°F | Resp 19 | Wt 116.0 lb

## 2019-07-12 DIAGNOSIS — D509 Iron deficiency anemia, unspecified: Secondary | ICD-10-CM | POA: Diagnosis not present

## 2019-07-12 DIAGNOSIS — C9000 Multiple myeloma not having achieved remission: Secondary | ICD-10-CM | POA: Diagnosis not present

## 2019-07-12 DIAGNOSIS — Z5112 Encounter for antineoplastic immunotherapy: Secondary | ICD-10-CM | POA: Diagnosis not present

## 2019-07-12 LAB — CMP (CANCER CENTER ONLY)
ALT: 14 U/L (ref 0–44)
AST: 15 U/L (ref 15–41)
Albumin: 4.4 g/dL (ref 3.5–5.0)
Alkaline Phosphatase: 66 U/L (ref 38–126)
Anion gap: 7 (ref 5–15)
BUN: 15 mg/dL (ref 8–23)
CO2: 27 mmol/L (ref 22–32)
Calcium: 9.2 mg/dL (ref 8.9–10.3)
Chloride: 106 mmol/L (ref 98–111)
Creatinine: 0.92 mg/dL (ref 0.44–1.00)
GFR, Est AFR Am: >60 mL/min (ref >60)
GFR, Estimated: >60 mL/min (ref >60)
Glucose, Bld: 121 mg/dL — ABNORMAL HIGH (ref 70–99)
Potassium: 4.6 mmol/L (ref 3.5–5.1)
Sodium: 140 mmol/L (ref 135–145)
Total Bilirubin: 0.4 mg/dL (ref 0.3–1.2)
Total Protein: 6.3 g/dL — ABNORMAL LOW (ref 6.5–8.1)

## 2019-07-12 LAB — CBC WITH DIFFERENTIAL (CANCER CENTER ONLY)
Abs Immature Granulocytes: 0.03 10*3/uL (ref 0.00–0.07)
Basophils Absolute: 0 10*3/uL (ref 0.0–0.1)
Basophils Relative: 0 %
Eosinophils Absolute: 0.1 10*3/uL (ref 0.0–0.5)
Eosinophils Relative: 1 %
HCT: 42.9 % (ref 36.0–46.0)
Hemoglobin: 14.4 g/dL (ref 12.0–15.0)
Immature Granulocytes: 0 %
Lymphocytes Relative: 16 %
Lymphs Abs: 1.4 10*3/uL (ref 0.7–4.0)
MCH: 33.2 pg (ref 26.0–34.0)
MCHC: 33.6 g/dL (ref 30.0–36.0)
MCV: 98.8 fL (ref 80.0–100.0)
Monocytes Absolute: 0.7 10*3/uL (ref 0.1–1.0)
Monocytes Relative: 8 %
Neutro Abs: 6.2 10*3/uL (ref 1.7–7.7)
Neutrophils Relative %: 75 %
Platelet Count: 249 10*3/uL (ref 150–400)
RBC: 4.34 MIL/uL (ref 3.87–5.11)
RDW: 11.9 % (ref 11.5–15.5)
WBC Count: 8.5 10*3/uL (ref 4.0–10.5)
nRBC: 0 % (ref 0.0–0.2)

## 2019-07-12 MED ORDER — PROCHLORPERAZINE MALEATE 10 MG PO TABS: 10.0000 mg | ORAL_TABLET | Freq: Once | ORAL | Status: DC

## 2019-07-12 MED ORDER — BORTEZOMIB CHEMO SQ INJECTION 3.5 MG (2.5MG/ML)
1.3000 mg/m2 | Freq: Once | INTRAMUSCULAR | Status: AC
  Administered 2019-07-12: 2 mg via SUBCUTANEOUS
  Filled 2019-07-12: qty 0.8

## 2019-07-12 MED ORDER — DENOSUMAB 120 MG/1.7ML ~~LOC~~ SOLN
120.0000 mg | Freq: Once | SUBCUTANEOUS | Status: AC
  Administered 2019-07-12: 120 mg via SUBCUTANEOUS

## 2019-07-12 NOTE — Patient Instructions (Signed)
Denosumab injection What is this medicine? DENOSUMAB (den oh sue mab) slows bone breakdown. Prolia is used to treat osteoporosis in women after menopause and in men, and in people who are taking corticosteroids for 6 months or more. Xgeva is used to treat a high calcium level due to cancer and to prevent bone fractures and other bone problems caused by multiple myeloma or cancer bone metastases. Xgeva is also used to treat giant cell tumor of the bone. This medicine may be used for other purposes; ask your health care provider or pharmacist if you have questions. COMMON BRAND NAME(S): Prolia, XGEVA What should I tell my health care provider before I take this medicine? They need to know if you have any of these conditions:  dental disease  having surgery or tooth extraction  infection  kidney disease  low levels of calcium or Vitamin D in the blood  malnutrition  on hemodialysis  skin conditions or sensitivity  thyroid or parathyroid disease  an unusual reaction to denosumab, other medicines, foods, dyes, or preservatives  pregnant or trying to get pregnant  breast-feeding How should I use this medicine? This medicine is for injection under the skin. It is given by a health care professional in a hospital or clinic setting. A special MedGuide will be given to you before each treatment. Be sure to read this information carefully each time. For Prolia, talk to your pediatrician regarding the use of this medicine in children. Special care may be needed. For Xgeva, talk to your pediatrician regarding the use of this medicine in children. While this drug may be prescribed for children as young as 13 years for selected conditions, precautions do apply. Overdosage: If you think you have taken too much of this medicine contact a poison control center or emergency room at once. NOTE: This medicine is only for you. Do not share this medicine with others. What if I miss a dose? It is  important not to miss your dose. Call your doctor or health care professional if you are unable to keep an appointment. What may interact with this medicine? Do not take this medicine with any of the following medications:  other medicines containing denosumab This medicine may also interact with the following medications:  medicines that lower your chance of fighting infection  steroid medicines like prednisone or cortisone This list may not describe all possible interactions. Give your health care provider a list of all the medicines, herbs, non-prescription drugs, or dietary supplements you use. Also tell them if you smoke, drink alcohol, or use illegal drugs. Some items may interact with your medicine. What should I watch for while using this medicine? Visit your doctor or health care professional for regular checks on your progress. Your doctor or health care professional may order blood tests and other tests to see how you are doing. Call your doctor or health care professional for advice if you get a fever, chills or sore throat, or other symptoms of a cold or flu. Do not treat yourself. This drug may decrease your body's ability to fight infection. Try to avoid being around people who are sick. You should make sure you get enough calcium and vitamin D while you are taking this medicine, unless your doctor tells you not to. Discuss the foods you eat and the vitamins you take with your health care professional. See your dentist regularly. Brush and floss your teeth as directed. Before you have any dental work done, tell your dentist you are   receiving this medicine. Do not become pregnant while taking this medicine or for 5 months after stopping it. Talk with your doctor or health care professional about your birth control options while taking this medicine. Women should inform their doctor if they wish to become pregnant or think they might be pregnant. There is a potential for serious side  effects to an unborn child. Talk to your health care professional or pharmacist for more information. What side effects may I notice from receiving this medicine? Side effects that you should report to your doctor or health care professional as soon as possible:  allergic reactions like skin rash, itching or hives, swelling of the face, lips, or tongue  bone pain  breathing problems  dizziness  jaw pain, especially after dental work  redness, blistering, peeling of the skin  signs and symptoms of infection like fever or chills; cough; sore throat; pain or trouble passing urine  signs of low calcium like fast heartbeat, muscle cramps or muscle pain; pain, tingling, numbness in the hands or feet; seizures  unusual bleeding or bruising  unusually weak or tired Side effects that usually do not require medical attention (report to your doctor or health care professional if they continue or are bothersome):  constipation  diarrhea  headache  joint pain  loss of appetite  muscle pain  runny nose  tiredness  upset stomach This list may not describe all possible side effects. Call your doctor for medical advice about side effects. You may report side effects to FDA at 1-800-FDA-1088. Where should I keep my medicine? This medicine is only given in a clinic, doctor's office, or other health care setting and will not be stored at home. NOTE: This sheet is a summary. It may not cover all possible information. If you have questions about this medicine, talk to your doctor, pharmacist, or health care provider.  2020 Elsevier/Gold Standard (2017-11-14 16:10:44) Bortezomib injection What is this medicine? BORTEZOMIB (bor TEZ oh mib) is a medicine that targets proteins in cancer cells and stops the cancer cells from growing. It is used to treat multiple myeloma and mantle-cell lymphoma. This medicine may be used for other purposes; ask your health care provider or pharmacist if you  have questions. COMMON BRAND NAME(S): Velcade What should I tell my health care provider before I take this medicine? They need to know if you have any of these conditions:  diabetes  heart disease  irregular heartbeat  liver disease  on hemodialysis  low blood counts, like low white blood cells, platelets, or hemoglobin  peripheral neuropathy  taking medicine for blood pressure  an unusual or allergic reaction to bortezomib, mannitol, boron, other medicines, foods, dyes, or preservatives  pregnant or trying to get pregnant  breast-feeding How should I use this medicine? This medicine is for injection into a vein or for injection under the skin. It is given by a health care professional in a hospital or clinic setting. Talk to your pediatrician regarding the use of this medicine in children. Special care may be needed. Overdosage: If you think you have taken too much of this medicine contact a poison control center or emergency room at once. NOTE: This medicine is only for you. Do not share this medicine with others. What if I miss a dose? It is important not to miss your dose. Call your doctor or health care professional if you are unable to keep an appointment. What may interact with this medicine? This medicine may interact with  the following medications:  ketoconazole  rifampin  ritonavir  St. John's Wort This list may not describe all possible interactions. Give your health care provider a list of all the medicines, herbs, non-prescription drugs, or dietary supplements you use. Also tell them if you smoke, drink alcohol, or use illegal drugs. Some items may interact with your medicine. What should I watch for while using this medicine? You may get drowsy or dizzy. Do not drive, use machinery, or do anything that needs mental alertness until you know how this medicine affects you. Do not stand or sit up quickly, especially if you are an older patient. This reduces the  risk of dizzy or fainting spells. In some cases, you may be given additional medicines to help with side effects. Follow all directions for their use. Call your doctor or health care professional for advice if you get a fever, chills or sore throat, or other symptoms of a cold or flu. Do not treat yourself. This drug decreases your body's ability to fight infections. Try to avoid being around people who are sick. This medicine may increase your risk to bruise or bleed. Call your doctor or health care professional if you notice any unusual bleeding. You may need blood work done while you are taking this medicine. In some patients, this medicine may cause a serious brain infection that may cause death. If you have any problems seeing, thinking, speaking, walking, or standing, tell your doctor right away. If you cannot reach your doctor, urgently seek other source of medical care. Check with your doctor or health care professional if you get an attack of severe diarrhea, nausea and vomiting, or if you sweat a lot. The loss of too much body fluid can make it dangerous for you to take this medicine. Do not become pregnant while taking this medicine or for at least 7 months after stopping it. Women should inform their doctor if they wish to become pregnant or think they might be pregnant. Men should not father a child while taking this medicine and for at least 4 months after stopping it. There is a potential for serious side effects to an unborn child. Talk to your health care professional or pharmacist for more information. Do not breast-feed an infant while taking this medicine or for 2 months after stopping it. This medicine may interfere with the ability to have a child. You should talk with your doctor or health care professional if you are concerned about your fertility. What side effects may I notice from receiving this medicine? Side effects that you should report to your doctor or health care  professional as soon as possible:  allergic reactions like skin rash, itching or hives, swelling of the face, lips, or tongue  breathing problems  changes in hearing  changes in vision  fast, irregular heartbeat  feeling faint or lightheaded, falls  pain, tingling, numbness in the hands or feet  right upper belly pain  seizures  swelling of the ankles, feet, hands  unusual bleeding or bruising  unusually weak or tired  vomiting  yellowing of the eyes or skin Side effects that usually do not require medical attention (report to your doctor or health care professional if they continue or are bothersome):  changes in emotions or moods  constipation  diarrhea  loss of appetite  headache  irritation at site where injected  nausea This list may not describe all possible side effects. Call your doctor for medical advice about side effects. You  may report side effects to FDA at 1-800-FDA-1088. Where should I keep my medicine? This drug is given in a hospital or clinic and will not be stored at home. NOTE: This sheet is a summary. It may not cover all possible information. If you have questions about this medicine, talk to your doctor, pharmacist, or health care provider.  2020 Elsevier/Gold Standard (2017-11-17 16:29:31)

## 2019-07-12 NOTE — Addendum Note (Signed)
Addended by: [name] on: 07/12/2019 03:32 PM   Modules accepted: Orders

## 2019-07-12 NOTE — Progress Notes (Signed)
Hematology and Oncology Follow Up Visit  Debra Ruiz [id number] 1945-11-07 73 y.o. 07/12/2019   Principle Diagnosis:  IgG Kappa myeloma - compression fractures - 13q-, +11, 14q+ - hyperdiploid -- progressive Iron def anemia  Completed Therapy: S/p Kyphoplasty of T11 and T12 XRT to lower spine-completed on 09/25/2016  Current Therapy:   Velcade/Pomalyst/Decadron -Start on 09/02/2018 --Pomalyst on hold since 11/09/2018. Xgeva 130m Sq q  3 month - next dose in November 2020 IV Injectafer -- last dose given on 11/2018   Interim History:  Debra Ruiz here today for follow-up.  She actually is looking pretty good.  She is not wearing the neck collar anymore.  I am really happy for her.  She has recovered very nicely from her cervical spine surgery.  She had a very quiet Thanksgiving.  It sounds like they will have a very quiet Christmas.  When we last saw her, her M spike was 0.2 g/dL.  We have to be careful with this.  I know that she had not gotten treated for a while.  Now that we have her back on treatment, hopefully we will see that the level will come down.  She has had no issues with nausea or vomiting.  She has had no cough.  There is been no shortness of breath.  She has had no issues with bowels or bladder.  There is been no leg swelling.  She has had no rashes.  Overall, her performance status is ECOG 1.    Medications:  Allergies as of 07/12/2019      Reactions   Crestor [rosuvastatin Calcium]    Causes liver functions to be elevated      Medication List       Accurate as of July 12, 2019  3:06 PM. If you have any questions, ask your nurse or doctor.        STOP taking these medications   ferrous sulfate 325 (65 FE) MG tablet Stopped by: PVolanda Napoleon MD     TAKE these medications   ALPRAZolam 0.25 MG tablet Commonly known as: XANAX Take 1 tablet (0.25 mg total) by mouth 2 (two) times daily as needed. for anxiety   amLODipine 5 MG  tablet Commonly known as: NORVASC TAKE 1 TABLET(5 MG) BY MOUTH DAILY   aspirin 81 MG EC tablet Take 81 mg by mouth daily.   atorvastatin 20 MG tablet Commonly known as: LIPITOR TAKE 1 TABLET(20 MG) BY MOUTH DAILY What changed: See the new instructions.   buPROPion 300 MG 24 hr tablet Commonly known as: Wellbutrin XL Take 1 tablet (300 mg total) by mouth daily.   dexamethasone 4 MG tablet Commonly known as: DECADRON TAKE 3 TABLETS BY MOUTH 1 TIME A WEEK   escitalopram 20 MG tablet Commonly known as: LEXAPRO TAKE 1 TABLET(20 MG) BY MOUTH DAILY What changed: additional instructions   famciclovir 250 MG tablet Commonly known as: FAMVIR TAKE 1 TABLET(250 MG) BY MOUTH DAILY What changed: See the new instructions.   IRON PO Take by mouth.   potassium chloride SA 20 MEQ tablet Commonly known as: KLOR-CON TAKE 1 TABLET(20 MEQ) BY MOUTH DAILY   prochlorperazine 10 MG tablet Commonly known as: COMPAZINE TAKE 1 TABLET(10 MG) BY MOUTH EVERY 6 HOURS AS NEEDED FOR NAUSEA OR VOMITING What changed: See the new instructions.   Vitamin D 50 MCG (2000 UT) Caps Take 2,000 Units by mouth daily.       Allergies:  Allergies  Allergen Reactions  .  Crestor [Rosuvastatin Calcium]     Causes liver functions to be elevated    Past Medical History, Surgical history, Social history, and Family History were reviewed and updated.  Review of Systems: Review of Systems  Constitutional: Negative.   HENT: Negative.   Eyes: Negative.   Respiratory: Negative.   Cardiovascular: Negative.   Gastrointestinal: Negative.   Genitourinary: Negative.   Musculoskeletal: Positive for back pain.  Skin: Negative.   Neurological: Negative.   Endo/Heme/Allergies: Negative.   Psychiatric/Behavioral: Negative.     Physical Exam:  weight is 116 lb (52.6 kg). Her temporal temperature is 98 F (36.7 C). Her blood pressure is 137/67 and her pulse is 100. Her respiration is 19 and oxygen saturation is  99%.   Wt Readings from Last 3 Encounters:  07/12/19 116 lb (52.6 kg)  06/21/19 117 lb (53.1 kg)  06/02/19 113 lb 12.8 oz (51.6 kg)    Physical Exam Vitals reviewed.  HENT:     Head: Normocephalic and atraumatic.  Eyes:     Pupils: Pupils are equal, round, and reactive to light.  Cardiovascular:     Rate and Rhythm: Normal rate and regular rhythm.     Heart sounds: Normal heart sounds.  Pulmonary:     Effort: Pulmonary effort is normal.     Breath sounds: Normal breath sounds.  Abdominal:     General: Bowel sounds are normal.     Palpations: Abdomen is soft.  Musculoskeletal:        General: No tenderness or deformity. Normal range of motion.     Cervical back: Normal range of motion.  Lymphadenopathy:     Cervical: No cervical adenopathy.  Skin:    General: Skin is warm and dry.     Findings: No erythema or rash.  Neurological:     Mental Status: She is alert and oriented to person, place, and time.  Psychiatric:        Behavior: Behavior normal.        Thought Content: Thought content normal.        Judgment: Judgment normal.       Lab Results  Component Value Date   WBC 8.5 07/12/2019   HGB 14.4 07/12/2019   HCT 42.9 07/12/2019   MCV 98.8 07/12/2019   PLT 249 07/12/2019   Lab Results  Component Value Date   FERRITIN 219 04/21/2019   IRON 181 (H) 04/21/2019   TIBC 285 04/21/2019   UIBC 104 (L) 04/21/2019   IRONPCTSAT 64 (H) 04/21/2019   Lab Results  Component Value Date   RBC 4.34 07/12/2019   Lab Results  Component Value Date   KPAFRELGTCHN 7.7 06/02/2019   LAMBDASER 3.7 (L) 06/02/2019   KAPLAMBRATIO 2.08 (H) 06/02/2019   Lab Results  Component Value Date   IGGSERUM 372 (L) 06/02/2019   IGA 29 (L) 06/02/2019   IGMSERUM 29 06/02/2019   Lab Results  Component Value Date   TOTALPROTELP 6.0 06/02/2019   ALBUMINELP 3.8 06/02/2019   A1GS 0.3 06/02/2019   A2GS 0.7 06/02/2019   BETS 0.9 06/02/2019   GAMS 0.3 (L) 06/02/2019   MSPIKE 0.2 (H)  06/02/2019   SPEI Comment 01/28/2018     Chemistry      Component Value Date/Time   NA 140 07/12/2019 1416   NA 148 (H) 07/16/2017 1128   NA 143 03/05/2017 1051   K 4.6 07/12/2019 1416   K 3.8 07/16/2017 1128   K 3.7 03/05/2017 1051   CL 106  07/12/2019 1416   CL 106 07/16/2017 1128   CO2 27 07/12/2019 1416   CO2 29 07/16/2017 1128   CO2 25 03/05/2017 1051   BUN 15 07/12/2019 1416   BUN 23 (H) 07/16/2017 1128   BUN 18.2 03/05/2017 1051   CREATININE 0.92 07/12/2019 1416   CREATININE 0.72 06/14/2019 0947   CREATININE 0.8 03/05/2017 1051      Component Value Date/Time   CALCIUM 9.2 07/12/2019 1416   CALCIUM 9.7 07/16/2017 1128   CALCIUM 8.9 03/05/2017 1051   ALKPHOS 66 07/12/2019 1416   ALKPHOS 71 07/16/2017 1128   ALKPHOS 67 03/05/2017 1051   AST 15 07/12/2019 1416   AST 15 03/05/2017 1051   ALT 14 07/12/2019 1416   ALT 23 07/16/2017 1128   ALT 17 03/05/2017 1051   BILITOT 0.4 07/12/2019 1416   BILITOT 0.55 03/05/2017 1051         Impression and Plan: Debra Ruiz is a very pleasant 73 yo caucasian female with IgG Kappa Myeloma.    We will have to see what the M spike is today.  Again, hopefully it will be lower or at least stable.  I think we probably have to have her treated every 4 weeks for right now.  We probably need to increase her frequency just because she missed so many treatments because of her surgery.  Overall, she is well physically.  I am happy about this.  She is exercising and walking.  [name], MD 12/21/20203:06 PM

## 2019-07-13 ENCOUNTER — Telehealth: Payer: Self-pay | Admitting: Hematology & Oncology

## 2019-07-13 LAB — IRON AND TIBC
Iron: 110 ug/dL (ref 41–142)
Saturation Ratios: 35 % (ref 21–57)
TIBC: 315 ug/dL (ref 236–444)
UIBC: 205 ug/dL (ref 120–384)

## 2019-07-13 LAB — KAPPA/LAMBDA LIGHT CHAINS
Kappa free light chain: 9.3 mg/L (ref 3.3–19.4)
Kappa, lambda light chain ratio: 2.66 — ABNORMAL HIGH (ref 0.26–1.65)
Lambda free light chains: 3.5 mg/L — ABNORMAL LOW (ref 5.7–26.3)

## 2019-07-13 LAB — IGG, IGA, IGM
IgA: 26 mg/dL — ABNORMAL LOW (ref 64–422)
IgG (Immunoglobin G), Serum: 376 mg/dL — ABNORMAL LOW (ref 586–1602)
IgM (Immunoglobulin M), Srm: 24 mg/dL — ABNORMAL LOW (ref 26–217)

## 2019-07-13 LAB — FERRITIN: Ferritin: 102 ng/mL (ref 11–307)

## 2019-07-13 NOTE — Telephone Encounter (Signed)
Appointments scheduled  Letter/calendar mailed per 12/21 los

## 2019-07-14 DIAGNOSIS — L718 Other rosacea: Secondary | ICD-10-CM | POA: Diagnosis not present

## 2019-07-14 DIAGNOSIS — L218 Other seborrheic dermatitis: Secondary | ICD-10-CM | POA: Diagnosis not present

## 2019-07-14 DIAGNOSIS — Z85828 Personal history of other malignant neoplasm of skin: Secondary | ICD-10-CM | POA: Diagnosis not present

## 2019-07-14 LAB — PROTEIN ELECTROPHORESIS, SERUM, WITH REFLEX
A/G Ratio: 1.9 — ABNORMAL HIGH (ref 0.7–1.7)
Albumin ELP: 3.9 g/dL (ref 2.9–4.4)
Alpha-1-Globulin: 0.2 g/dL (ref 0.0–0.4)
Alpha-2-Globulin: 0.7 g/dL (ref 0.4–1.0)
Beta Globulin: 0.9 g/dL (ref 0.7–1.3)
Gamma Globulin: 0.3 g/dL — ABNORMAL LOW (ref 0.4–1.8)
Globulin, Total: 2.1 g/dL — ABNORMAL LOW (ref 2.2–3.9)
M-Spike, %: 0.1 g/dL — ABNORMAL HIGH
SPEP Interpretation: 0
Total Protein ELP: 6 g/dL (ref 6.0–8.5)

## 2019-07-14 LAB — IMMUNOFIXATION REFLEX, SERUM
IgA: 26 mg/dL — ABNORMAL LOW (ref 64–422)
IgG (Immunoglobin G), Serum: 379 mg/dL — ABNORMAL LOW (ref 586–1602)
IgM (Immunoglobulin M), Srm: 25 mg/dL — ABNORMAL LOW (ref 26–217)

## 2019-07-15 ENCOUNTER — Telehealth: Payer: Self-pay | Admitting: *Deleted

## 2019-07-15 NOTE — Telephone Encounter (Signed)
-----   Message from [name], MD sent at 07/15/2019  7:16 AM EST ----- Call - the myeloma is down to 0.1!!!  Merry Christmas!!!  [name]

## 2019-07-15 NOTE — Telephone Encounter (Signed)
As noted below by Dr. Marin Olp, I informed the patient that the myeloma is down to 0.1. She verbalized understanding.

## 2019-07-19 ENCOUNTER — Telehealth: Payer: Self-pay | Admitting: *Deleted

## 2019-07-19 NOTE — Telephone Encounter (Signed)
Call received from patient stating that she was told on 07/15/19 that her Velcade injections would be changed to every six weeks per order of Dr. Marin Olp.  Dr. Marin Olp notified and OK for pt to change treatments to every six weeks.  Message sent to scheduling.

## 2019-07-20 DIAGNOSIS — M542 Cervicalgia: Secondary | ICD-10-CM | POA: Diagnosis not present

## 2019-07-20 DIAGNOSIS — Z9889 Other specified postprocedural states: Secondary | ICD-10-CM | POA: Diagnosis not present

## 2019-08-02 DIAGNOSIS — M4322 Fusion of spine, cervical region: Secondary | ICD-10-CM | POA: Diagnosis not present

## 2019-08-02 DIAGNOSIS — M2560 Stiffness of unspecified joint, not elsewhere classified: Secondary | ICD-10-CM | POA: Diagnosis not present

## 2019-08-06 DIAGNOSIS — M25512 Pain in left shoulder: Secondary | ICD-10-CM | POA: Diagnosis not present

## 2019-08-06 DIAGNOSIS — M542 Cervicalgia: Secondary | ICD-10-CM | POA: Diagnosis not present

## 2019-08-09 ENCOUNTER — Other Ambulatory Visit: Payer: PPO

## 2019-08-09 ENCOUNTER — Ambulatory Visit: Payer: PPO

## 2019-08-09 ENCOUNTER — Ambulatory Visit: Payer: PPO | Admitting: Hematology & Oncology

## 2019-08-09 DIAGNOSIS — M4322 Fusion of spine, cervical region: Secondary | ICD-10-CM | POA: Diagnosis not present

## 2019-08-09 DIAGNOSIS — M2560 Stiffness of unspecified joint, not elsewhere classified: Secondary | ICD-10-CM | POA: Diagnosis not present

## 2019-08-12 DIAGNOSIS — M2560 Stiffness of unspecified joint, not elsewhere classified: Secondary | ICD-10-CM | POA: Diagnosis not present

## 2019-08-12 DIAGNOSIS — M4322 Fusion of spine, cervical region: Secondary | ICD-10-CM | POA: Diagnosis not present

## 2019-08-13 ENCOUNTER — Encounter: Payer: Self-pay | Admitting: Internal Medicine

## 2019-08-13 DIAGNOSIS — M85852 Other specified disorders of bone density and structure, left thigh: Secondary | ICD-10-CM | POA: Diagnosis not present

## 2019-08-13 DIAGNOSIS — Z1231 Encounter for screening mammogram for malignant neoplasm of breast: Secondary | ICD-10-CM | POA: Diagnosis not present

## 2019-08-13 LAB — HM MAMMOGRAPHY

## 2019-08-16 ENCOUNTER — Encounter: Payer: Self-pay | Admitting: Internal Medicine

## 2019-08-19 DIAGNOSIS — M2560 Stiffness of unspecified joint, not elsewhere classified: Secondary | ICD-10-CM | POA: Diagnosis not present

## 2019-08-19 DIAGNOSIS — M4322 Fusion of spine, cervical region: Secondary | ICD-10-CM | POA: Diagnosis not present

## 2019-08-20 ENCOUNTER — Encounter: Payer: Self-pay | Admitting: Internal Medicine

## 2019-08-25 ENCOUNTER — Ambulatory Visit: Payer: PPO | Admitting: Hematology & Oncology

## 2019-08-25 ENCOUNTER — Telehealth: Payer: Self-pay | Admitting: Internal Medicine

## 2019-08-25 ENCOUNTER — Inpatient Hospital Stay (HOSPITAL_BASED_OUTPATIENT_CLINIC_OR_DEPARTMENT_OTHER): Payer: PPO | Admitting: Hematology & Oncology

## 2019-08-25 ENCOUNTER — Other Ambulatory Visit: Payer: Self-pay

## 2019-08-25 ENCOUNTER — Inpatient Hospital Stay: Payer: PPO

## 2019-08-25 ENCOUNTER — Ambulatory Visit: Payer: PPO

## 2019-08-25 ENCOUNTER — Inpatient Hospital Stay: Payer: PPO | Attending: Hematology & Oncology

## 2019-08-25 ENCOUNTER — Other Ambulatory Visit: Payer: PPO

## 2019-08-25 ENCOUNTER — Encounter: Payer: Self-pay | Admitting: Hematology & Oncology

## 2019-08-25 VITALS — BP 157/72 | HR 98 | Temp 97.3°F | Resp 19 | Wt 115.0 lb

## 2019-08-25 DIAGNOSIS — C9000 Multiple myeloma not having achieved remission: Secondary | ICD-10-CM

## 2019-08-25 DIAGNOSIS — Z5112 Encounter for antineoplastic immunotherapy: Secondary | ICD-10-CM | POA: Insufficient documentation

## 2019-08-25 DIAGNOSIS — D500 Iron deficiency anemia secondary to blood loss (chronic): Secondary | ICD-10-CM | POA: Diagnosis not present

## 2019-08-25 LAB — CBC WITH DIFFERENTIAL (CANCER CENTER ONLY)
Abs Immature Granulocytes: 0.08 10*3/uL — ABNORMAL HIGH (ref 0.00–0.07)
Basophils Absolute: 0 10*3/uL (ref 0.0–0.1)
Basophils Relative: 0 %
Eosinophils Absolute: 0.1 10*3/uL (ref 0.0–0.5)
Eosinophils Relative: 1 %
HCT: 42.3 % (ref 36.0–46.0)
Hemoglobin: 14.2 g/dL (ref 12.0–15.0)
Immature Granulocytes: 1 %
Lymphocytes Relative: 14 %
Lymphs Abs: 1.2 10*3/uL (ref 0.7–4.0)
MCH: 32.3 pg (ref 26.0–34.0)
MCHC: 33.6 g/dL (ref 30.0–36.0)
MCV: 96.4 fL (ref 80.0–100.0)
Monocytes Absolute: 0.7 10*3/uL (ref 0.1–1.0)
Monocytes Relative: 9 %
Neutro Abs: 6.3 10*3/uL (ref 1.7–7.7)
Neutrophils Relative %: 75 %
Platelet Count: 253 10*3/uL (ref 150–400)
RBC: 4.39 MIL/uL (ref 3.87–5.11)
RDW: 12.4 % (ref 11.5–15.5)
WBC Count: 8.4 10*3/uL (ref 4.0–10.5)
nRBC: 0 % (ref 0.0–0.2)

## 2019-08-25 LAB — CMP (CANCER CENTER ONLY)
ALT: 16 U/L (ref 0–44)
AST: 15 U/L (ref 15–41)
Albumin: 4.5 g/dL (ref 3.5–5.0)
Alkaline Phosphatase: 61 U/L (ref 38–126)
Anion gap: 8 (ref 5–15)
BUN: 15 mg/dL (ref 8–23)
CO2: 27 mmol/L (ref 22–32)
Calcium: 9.4 mg/dL (ref 8.9–10.3)
Chloride: 107 mmol/L (ref 98–111)
Creatinine: 0.77 mg/dL (ref 0.44–1.00)
GFR, Est AFR Am: >60 mL/min (ref >60)
GFR, Estimated: >60 mL/min (ref >60)
Glucose, Bld: 158 mg/dL — ABNORMAL HIGH (ref 70–99)
Potassium: 3.6 mmol/L (ref 3.5–5.1)
Sodium: 142 mmol/L (ref 135–145)
Total Bilirubin: 0.5 mg/dL (ref 0.3–1.2)
Total Protein: 6.4 g/dL — ABNORMAL LOW (ref 6.5–8.1)

## 2019-08-25 MED ORDER — PROCHLORPERAZINE MALEATE 10 MG PO TABS: 10.0000 mg | ORAL_TABLET | Freq: Once | ORAL | Status: DC

## 2019-08-25 MED ORDER — BORTEZOMIB CHEMO SQ INJECTION 3.5 MG (2.5MG/ML)
1.3000 mg/m2 | Freq: Once | INTRAMUSCULAR | Status: AC
  Administered 2019-08-25: 2 mg via SUBCUTANEOUS
  Filled 2019-08-25: qty 0.8

## 2019-08-25 NOTE — Patient Instructions (Signed)
[facility] Discharge Instructions for Patients Receiving Chemotherapy  Today you received the following chemotherapy agents:  Velcade  To help prevent nausea and vomiting after your treatment, we encourage you to take your nausea medication as ordered per MD.    If you develop nausea and vomiting that is not controlled by your nausea medication, call the clinic.   BELOW ARE SYMPTOMS THAT SHOULD BE REPORTED IMMEDIATELY:  *FEVER GREATER THAN 100.5 F  *CHILLS WITH OR WITHOUT FEVER  NAUSEA AND VOMITING THAT IS NOT CONTROLLED WITH YOUR NAUSEA MEDICATION  *UNUSUAL SHORTNESS OF BREATH  *UNUSUAL BRUISING OR BLEEDING  TENDERNESS IN MOUTH AND THROAT WITH OR WITHOUT PRESENCE OF ULCERS  *URINARY PROBLEMS  *BOWEL PROBLEMS  UNUSUAL RASH Items with * indicate a potential emergency and should be followed up as soon as possible.  Feel free to call the clinic should you have any questions or concerns. The clinic phone number is [phone].  Please show the CHEMO ALERT CARD at check-in to the Emergency Department and triage nurse.   

## 2019-08-25 NOTE — Telephone Encounter (Signed)
Debra Ruiz [phone]  [name] called to say that her Cancer doctor wants her to go back on Bystolic, he had previously took her off of it. I let her know that after her visit today to have them send office notes about going back on medication since he had taking her off of it, and then we could go from there. She verbalized understanding.

## 2019-08-25 NOTE — Progress Notes (Signed)
Hematology and Oncology Follow Up Visit  Debra Ruiz [id number] 1945-07-28 74 y.o. 08/25/2019   Principle Diagnosis:  IgG Kappa myeloma - compression fractures - 13q-, +11, 14q+ - hyperdiploid -- progressive Iron def anemia  Completed Therapy: S/p Kyphoplasty of T11 and T12 XRT to lower spine-completed on 09/25/2016  Current Therapy:   Velcade/Pomalyst/Decadron -Start on 09/02/2018 --Pomalyst on hold since 11/09/2018. Xgeva 128m Sq q  3 month - next dose in May 2021  IV Injectafer -- last dose given on 11/2018   Interim History:  Debra Ruiz here today for follow-up.  She is doing okay.  Her main complaint has been some tingling in the left arm.  This seems to happen when she sleeps.  I will know she is sleeping on it wrong.  I know that she had her neck surgery back in October.  She will see her orthopedic doctor regarding this.  She is responding to the Velcade.  So far, everything is going quite well.  Her last monoclonal spike was 0.1 g/dL.  She has not had the coronavirus vaccine yet.  She and her husband I think are going to get it in a couple weeks.  She has not had any problems with fever.  She has had no bleeding.  There is no change in bowel or bladder habits.  She has had no leg swelling.  Her appetite is good.  She did have a nice Christmas and New Year's.    Overall, her performance status is ECOG 1.    Medications:  Allergies as of 08/25/2019      Reactions   Crestor [rosuvastatin Calcium]    Causes liver functions to be elevated      Medication List       Accurate as of August 25, 2019  1:59 PM. If you have any questions, ask your nurse or doctor.        ALPRAZolam 0.25 MG tablet Commonly known as: XANAX Take 1 tablet (0.25 mg total) by mouth 2 (two) times daily as needed. for anxiety   amLODipine 5 MG tablet Commonly known as: NORVASC TAKE 1 TABLET(5 MG) BY MOUTH DAILY   aspirin 81 MG EC tablet Take 81 mg by mouth daily.   atorvastatin  20 MG tablet Commonly known as: LIPITOR TAKE 1 TABLET(20 MG) BY MOUTH DAILY What changed: See the new instructions.   buPROPion 300 MG 24 hr tablet Commonly known as: Wellbutrin XL Take 1 tablet (300 mg total) by mouth daily.   dexamethasone 4 MG tablet Commonly known as: DECADRON TAKE 3 TABLETS BY MOUTH 1 TIME A WEEK   escitalopram 20 MG tablet Commonly known as: LEXAPRO TAKE 1 TABLET(20 MG) BY MOUTH DAILY What changed: additional instructions   famciclovir 250 MG tablet Commonly known as: FAMVIR TAKE 1 TABLET(250 MG) BY MOUTH DAILY What changed: See the new instructions.   IRON PO Take by mouth.   potassium chloride SA 20 MEQ tablet Commonly known as: KLOR-CON TAKE 1 TABLET(20 MEQ) BY MOUTH DAILY   prochlorperazine 10 MG tablet Commonly known as: COMPAZINE TAKE 1 TABLET(10 MG) BY MOUTH EVERY 6 HOURS AS NEEDED FOR NAUSEA OR VOMITING What changed: See the new instructions.   Vitamin D 50 MCG (2000 UT) Caps Take 2,000 Units by mouth daily.       Allergies:  Allergies  Allergen Reactions  . Crestor [Rosuvastatin Calcium]     Causes liver functions to be elevated    Past Medical History, Surgical history, Social  history, and Family History were reviewed and updated.  Review of Systems: Review of Systems  Constitutional: Negative.   HENT: Negative.   Eyes: Negative.   Respiratory: Negative.   Cardiovascular: Negative.   Gastrointestinal: Negative.   Genitourinary: Negative.   Musculoskeletal: Positive for back pain.  Skin: Negative.   Neurological: Negative.   Endo/Heme/Allergies: Negative.   Psychiatric/Behavioral: Negative.     Physical Exam:  weight is 115 lb (52.2 kg). Her temporal temperature is 97.3 F (36.3 C) (abnormal). Her blood pressure is 157/72 (abnormal) and her pulse is 98. Her respiration is 19 and oxygen saturation is 99%.   Wt Readings from Last 3 Encounters:  08/25/19 115 lb (52.2 kg)  07/12/19 116 lb (52.6 kg)  06/21/19 117 lb  (53.1 kg)    Physical Exam Vitals reviewed.  HENT:     Head: Normocephalic and atraumatic.  Eyes:     Pupils: Pupils are equal, round, and reactive to light.  Cardiovascular:     Rate and Rhythm: Normal rate and regular rhythm.     Heart sounds: Normal heart sounds.  Pulmonary:     Effort: Pulmonary effort is normal.     Breath sounds: Normal breath sounds.  Abdominal:     General: Bowel sounds are normal.     Palpations: Abdomen is soft.  Musculoskeletal:        General: No tenderness or deformity. Normal range of motion.     Cervical back: Normal range of motion.  Lymphadenopathy:     Cervical: No cervical adenopathy.  Skin:    General: Skin is warm and dry.     Findings: No erythema or rash.  Neurological:     Mental Status: She is alert and oriented to person, place, and time.  Psychiatric:        Behavior: Behavior normal.        Thought Content: Thought content normal.        Judgment: Judgment normal.       Lab Results  Component Value Date   WBC 8.4 08/25/2019   HGB 14.2 08/25/2019   HCT 42.3 08/25/2019   MCV 96.4 08/25/2019   PLT 253 08/25/2019   Lab Results  Component Value Date   FERRITIN 102 07/12/2019   IRON 110 07/12/2019   TIBC 315 07/12/2019   UIBC 205 07/12/2019   IRONPCTSAT 35 07/12/2019   Lab Results  Component Value Date   RBC 4.39 08/25/2019   Lab Results  Component Value Date   KPAFRELGTCHN 9.3 07/12/2019   LAMBDASER 3.5 (L) 07/12/2019   KAPLAMBRATIO 2.66 (H) 07/12/2019   Lab Results  Component Value Date   IGGSERUM 376 (L) 07/12/2019   IGGSERUM 379 (L) 07/12/2019   IGA 26 (L) 07/12/2019   IGA 26 (L) 07/12/2019   IGMSERUM 24 (L) 07/12/2019   IGMSERUM 25 (L) 07/12/2019   Lab Results  Component Value Date   TOTALPROTELP 6.0 07/12/2019   ALBUMINELP 3.9 07/12/2019   A1GS 0.2 07/12/2019   A2GS 0.7 07/12/2019   BETS 0.9 07/12/2019   GAMS 0.3 (L) 07/12/2019   MSPIKE 0.1 (H) 07/12/2019   SPEI Comment 01/28/2018      Chemistry      Component Value Date/Time   NA 140 07/12/2019 1416   NA 148 (H) 07/16/2017 1128   NA 143 03/05/2017 1051   K 4.6 07/12/2019 1416   K 3.8 07/16/2017 1128   K 3.7 03/05/2017 1051   CL 106 07/12/2019 1416   CL 106  07/16/2017 1128   CO2 27 07/12/2019 1416   CO2 29 07/16/2017 1128   CO2 25 03/05/2017 1051   BUN 15 07/12/2019 1416   BUN 23 (H) 07/16/2017 1128   BUN 18.2 03/05/2017 1051   CREATININE 0.92 07/12/2019 1416   CREATININE 0.72 06/14/2019 0947   CREATININE 0.8 03/05/2017 1051      Component Value Date/Time   CALCIUM 9.2 07/12/2019 1416   CALCIUM 9.7 07/16/2017 1128   CALCIUM 8.9 03/05/2017 1051   ALKPHOS 66 07/12/2019 1416   ALKPHOS 71 07/16/2017 1128   ALKPHOS 67 03/05/2017 1051   AST 15 07/12/2019 1416   AST 15 03/05/2017 1051   ALT 14 07/12/2019 1416   ALT 23 07/16/2017 1128   ALT 17 03/05/2017 1051   BILITOT 0.4 07/12/2019 1416   BILITOT 0.55 03/05/2017 1051         Impression and Plan: Ms. Esperanza is a very pleasant 74 yo caucasian female with IgG Kappa Myeloma.    We will move her appointments to every 6 weeks again.  She really enjoys every 6-week follow-up.  This works well for her.  So far, this is been quite effective.  Hopefully, she will not need any invasive studies for this left arm issue.  She will get the Velcade today.  She is really not anemic.  The iron that we had given her in the past definitely is helping.     [name], MD 2/3/20211:59 PM

## 2019-08-26 LAB — IGG, IGA, IGM
IgA: 26 mg/dL — ABNORMAL LOW (ref 64–422)
IgG (Immunoglobin G), Serum: 384 mg/dL — ABNORMAL LOW (ref 586–1602)
IgM (Immunoglobulin M), Srm: 23 mg/dL — ABNORMAL LOW (ref 26–217)

## 2019-08-26 LAB — KAPPA/LAMBDA LIGHT CHAINS
Kappa free light chain: 11.5 mg/L (ref 3.3–19.4)
Kappa, lambda light chain ratio: 3.03 — ABNORMAL HIGH (ref 0.26–1.65)
Lambda free light chains: 3.8 mg/L — ABNORMAL LOW (ref 5.7–26.3)

## 2019-08-26 MED ORDER — NEBIVOLOL HCL 5 MG PO TABS: 5.0000 mg | ORAL_TABLET | Freq: Every day | ORAL | 1 refills | Status: DC

## 2019-08-26 NOTE — Telephone Encounter (Signed)
Refill Bystolic for her.

## 2019-08-30 DIAGNOSIS — M542 Cervicalgia: Secondary | ICD-10-CM | POA: Diagnosis not present

## 2019-08-30 DIAGNOSIS — M5412 Radiculopathy, cervical region: Secondary | ICD-10-CM | POA: Diagnosis not present

## 2019-08-30 DIAGNOSIS — M4322 Fusion of spine, cervical region: Secondary | ICD-10-CM | POA: Diagnosis not present

## 2019-08-30 DIAGNOSIS — M2560 Stiffness of unspecified joint, not elsewhere classified: Secondary | ICD-10-CM | POA: Diagnosis not present

## 2019-08-30 LAB — PROTEIN ELECTROPHORESIS, SERUM, WITH REFLEX
A/G Ratio: 1.6 (ref 0.7–1.7)
Albumin ELP: 3.7 g/dL (ref 2.9–4.4)
Alpha-1-Globulin: 0.2 g/dL (ref 0.0–0.4)
Alpha-2-Globulin: 0.8 g/dL (ref 0.4–1.0)
Beta Globulin: 0.9 g/dL (ref 0.7–1.3)
Gamma Globulin: 0.4 g/dL (ref 0.4–1.8)
Globulin, Total: 2.3 g/dL (ref 2.2–3.9)
M-Spike, %: 0.2 g/dL — ABNORMAL HIGH
SPEP Interpretation: 0
Total Protein ELP: 6 g/dL (ref 6.0–8.5)

## 2019-08-30 LAB — IMMUNOFIXATION REFLEX, SERUM
IgA: 26 mg/dL — ABNORMAL LOW (ref 64–422)
IgG (Immunoglobin G), Serum: 386 mg/dL — ABNORMAL LOW (ref 586–1602)
IgM (Immunoglobulin M), Srm: 23 mg/dL — ABNORMAL LOW (ref 26–217)

## 2019-08-31 ENCOUNTER — Encounter: Payer: Self-pay | Admitting: *Deleted

## 2019-08-31 DIAGNOSIS — R200 Anesthesia of skin: Secondary | ICD-10-CM | POA: Diagnosis not present

## 2019-09-02 DIAGNOSIS — M4322 Fusion of spine, cervical region: Secondary | ICD-10-CM | POA: Diagnosis not present

## 2019-09-02 DIAGNOSIS — M2560 Stiffness of unspecified joint, not elsewhere classified: Secondary | ICD-10-CM | POA: Diagnosis not present

## 2019-09-07 ENCOUNTER — Other Ambulatory Visit: Payer: Self-pay | Admitting: Orthopedic Surgery

## 2019-09-07 DIAGNOSIS — M4322 Fusion of spine, cervical region: Secondary | ICD-10-CM | POA: Diagnosis not present

## 2019-09-07 DIAGNOSIS — M5412 Radiculopathy, cervical region: Secondary | ICD-10-CM

## 2019-09-07 DIAGNOSIS — M2560 Stiffness of unspecified joint, not elsewhere classified: Secondary | ICD-10-CM | POA: Diagnosis not present

## 2019-09-10 DIAGNOSIS — M4322 Fusion of spine, cervical region: Secondary | ICD-10-CM | POA: Diagnosis not present

## 2019-09-10 DIAGNOSIS — M2560 Stiffness of unspecified joint, not elsewhere classified: Secondary | ICD-10-CM | POA: Diagnosis not present

## 2019-09-13 DIAGNOSIS — M2560 Stiffness of unspecified joint, not elsewhere classified: Secondary | ICD-10-CM | POA: Diagnosis not present

## 2019-09-13 DIAGNOSIS — M4322 Fusion of spine, cervical region: Secondary | ICD-10-CM | POA: Diagnosis not present

## 2019-09-15 DIAGNOSIS — M2560 Stiffness of unspecified joint, not elsewhere classified: Secondary | ICD-10-CM | POA: Diagnosis not present

## 2019-09-15 DIAGNOSIS — M5412 Radiculopathy, cervical region: Secondary | ICD-10-CM | POA: Diagnosis not present

## 2019-09-15 DIAGNOSIS — M4322 Fusion of spine, cervical region: Secondary | ICD-10-CM | POA: Diagnosis not present

## 2019-09-17 ENCOUNTER — Other Ambulatory Visit: Payer: Self-pay

## 2019-09-17 MED ORDER — BUPROPION HCL ER (XL) 300 MG PO TB24: 300.0000 mg | ORAL_TABLET | Freq: Every day | ORAL | 1 refills | Status: DC

## 2019-09-22 ENCOUNTER — Telehealth: Payer: Self-pay | Admitting: Hematology & Oncology

## 2019-09-22 NOTE — Telephone Encounter (Signed)
Called and spoke with patient regarding appointment date/time changes.  She was ok with new date/time

## 2019-09-25 ENCOUNTER — Other Ambulatory Visit: Payer: Self-pay

## 2019-09-25 ENCOUNTER — Ambulatory Visit
Admission: RE | Admit: 2019-09-25 | Discharge: 2019-09-25 | Disposition: A | Discharge status: Home / Self Care | Payer: PPO | Source: Ambulatory Visit | Attending: Orthopedic Surgery | Admitting: Orthopedic Surgery

## 2019-09-25 DIAGNOSIS — M5412 Radiculopathy, cervical region: Secondary | ICD-10-CM

## 2019-09-25 DIAGNOSIS — M4802 Spinal stenosis, cervical region: Secondary | ICD-10-CM | POA: Diagnosis not present

## 2019-09-27 DIAGNOSIS — M5412 Radiculopathy, cervical region: Secondary | ICD-10-CM | POA: Diagnosis not present

## 2019-10-05 ENCOUNTER — Ambulatory Visit: Payer: PPO | Admitting: Hematology & Oncology

## 2019-10-05 ENCOUNTER — Other Ambulatory Visit: Payer: PPO

## 2019-10-05 ENCOUNTER — Inpatient Hospital Stay: Payer: PPO

## 2019-10-06 ENCOUNTER — Inpatient Hospital Stay: Payer: PPO | Attending: Hematology & Oncology

## 2019-10-06 ENCOUNTER — Inpatient Hospital Stay: Payer: PPO

## 2019-10-06 ENCOUNTER — Other Ambulatory Visit: Payer: Self-pay

## 2019-10-06 ENCOUNTER — Inpatient Hospital Stay (HOSPITAL_BASED_OUTPATIENT_CLINIC_OR_DEPARTMENT_OTHER): Payer: PPO | Admitting: Hematology & Oncology

## 2019-10-06 ENCOUNTER — Encounter: Payer: Self-pay | Admitting: Hematology & Oncology

## 2019-10-06 VITALS — BP 114/62 | HR 79 | Temp 97.8°F | Resp 20 | Wt 117.0 lb

## 2019-10-06 DIAGNOSIS — C9000 Multiple myeloma not having achieved remission: Secondary | ICD-10-CM

## 2019-10-06 DIAGNOSIS — Z5112 Encounter for antineoplastic immunotherapy: Secondary | ICD-10-CM | POA: Insufficient documentation

## 2019-10-06 DIAGNOSIS — D500 Iron deficiency anemia secondary to blood loss (chronic): Secondary | ICD-10-CM

## 2019-10-06 DIAGNOSIS — D509 Iron deficiency anemia, unspecified: Secondary | ICD-10-CM | POA: Diagnosis not present

## 2019-10-06 LAB — CMP (CANCER CENTER ONLY)
ALT: 14 U/L (ref 0–44)
AST: 15 U/L (ref 15–41)
Albumin: 4.6 g/dL (ref 3.5–5.0)
Alkaline Phosphatase: 58 U/L (ref 38–126)
Anion gap: 7 (ref 5–15)
BUN: 19 mg/dL (ref 8–23)
CO2: 28 mmol/L (ref 22–32)
Calcium: 9.5 mg/dL (ref 8.9–10.3)
Chloride: 107 mmol/L (ref 98–111)
Creatinine: 0.83 mg/dL (ref 0.44–1.00)
GFR, Est AFR Am: >60 mL/min (ref >60)
GFR, Estimated: >60 mL/min (ref >60)
Glucose, Bld: 131 mg/dL — ABNORMAL HIGH (ref 70–99)
Potassium: 4.3 mmol/L (ref 3.5–5.1)
Sodium: 142 mmol/L (ref 135–145)
Total Bilirubin: 0.7 mg/dL (ref 0.3–1.2)
Total Protein: 6.3 g/dL — ABNORMAL LOW (ref 6.5–8.1)

## 2019-10-06 LAB — CBC WITH DIFFERENTIAL (CANCER CENTER ONLY)
Abs Immature Granulocytes: 0.09 10*3/uL — ABNORMAL HIGH (ref 0.00–0.07)
Basophils Absolute: 0 10*3/uL (ref 0.0–0.1)
Basophils Relative: 0 %
Eosinophils Absolute: 0.1 10*3/uL (ref 0.0–0.5)
Eosinophils Relative: 1 %
HCT: 40.9 % (ref 36.0–46.0)
Hemoglobin: 13.4 g/dL (ref 12.0–15.0)
Immature Granulocytes: 1 %
Lymphocytes Relative: 17 %
Lymphs Abs: 1.1 10*3/uL (ref 0.7–4.0)
MCH: 32.8 pg (ref 26.0–34.0)
MCHC: 32.8 g/dL (ref 30.0–36.0)
MCV: 100.2 fL — ABNORMAL HIGH (ref 80.0–100.0)
Monocytes Absolute: 0.7 10*3/uL (ref 0.1–1.0)
Monocytes Relative: 11 %
Neutro Abs: 4.8 10*3/uL (ref 1.7–7.7)
Neutrophils Relative %: 70 %
Platelet Count: 250 10*3/uL (ref 150–400)
RBC: 4.08 MIL/uL (ref 3.87–5.11)
RDW: 13.2 % (ref 11.5–15.5)
WBC Count: 6.9 10*3/uL (ref 4.0–10.5)
nRBC: 0 % (ref 0.0–0.2)

## 2019-10-06 MED ORDER — BORTEZOMIB CHEMO SQ INJECTION 3.5 MG (2.5MG/ML)
1.3000 mg/m2 | Freq: Once | INTRAMUSCULAR | Status: AC
  Administered 2019-10-06: 2 mg via SUBCUTANEOUS
  Filled 2019-10-06: qty 0.8

## 2019-10-06 MED ORDER — PROCHLORPERAZINE MALEATE 10 MG PO TABS
ORAL_TABLET | ORAL | Status: AC
  Filled 2019-10-06: qty 1

## 2019-10-06 MED ORDER — DENOSUMAB 120 MG/1.7ML ~~LOC~~ SOLN
SUBCUTANEOUS | Status: AC
  Filled 2019-10-06: qty 1.7

## 2019-10-06 MED ORDER — DENOSUMAB 120 MG/1.7ML ~~LOC~~ SOLN
120.0000 mg | Freq: Once | SUBCUTANEOUS | Status: AC
  Administered 2019-10-06: 120 mg via SUBCUTANEOUS

## 2019-10-06 MED ORDER — PROCHLORPERAZINE MALEATE 10 MG PO TABS
10.0000 mg | ORAL_TABLET | Freq: Once | ORAL | Status: AC
  Administered 2019-10-06: 10 mg via ORAL

## 2019-10-06 NOTE — Patient Instructions (Signed)
[facility] Discharge Instructions for Patients Receiving Chemotherapy  Today you received the following chemotherapy agents:  Velcade  To help prevent nausea and vomiting after your treatment, we encourage you to take your nausea medication as ordered per MD.    If you develop nausea and vomiting that is not controlled by your nausea medication, call the clinic.   BELOW ARE SYMPTOMS THAT SHOULD BE REPORTED IMMEDIATELY:  *FEVER GREATER THAN 100.5 F  *CHILLS WITH OR WITHOUT FEVER  NAUSEA AND VOMITING THAT IS NOT CONTROLLED WITH YOUR NAUSEA MEDICATION  *UNUSUAL SHORTNESS OF BREATH  *UNUSUAL BRUISING OR BLEEDING  TENDERNESS IN MOUTH AND THROAT WITH OR WITHOUT PRESENCE OF ULCERS  *URINARY PROBLEMS  *BOWEL PROBLEMS  UNUSUAL RASH Items with * indicate a potential emergency and should be followed up as soon as possible.  Feel free to call the clinic should you have any questions or concerns. The clinic phone number is [phone].  Please show the CHEMO ALERT CARD at check-in to the Emergency Department and triage nurse.   

## 2019-10-06 NOTE — Progress Notes (Signed)
Hematology and Oncology Follow Up Visit  SARAHELIZABETH CONWAY [id number] 1946/05/19 74 y.o. 10/06/2019   Principle Diagnosis:  IgG Kappa myeloma - compression fractures - 13q-, +11, 14q+ - hyperdiploid -- progressive Iron def anemia  Completed Therapy: S/p Kyphoplasty of T11 and T12 XRT to lower spine-completed on 09/25/2016  Current Therapy:   Velcade/Pomalyst/Decadron -Start on 09/02/2018 --Pomalyst on hold since 11/09/2018. Xgeva '120mg'$  Sq q  3 month - next dose in April 2021  IV Injectafer -- last dose given on 11/2018   Interim History:  Ms. Fenstermaker is here today for follow-up.  She is doing okay.  She is having some problems with her neck again.  Sounds like she has some radicular pain in her left arm.  I suspect that she probably is on Neurontin for this now.  She is also having some sleeping issues.  Again this is nothing that really is new.   She has had an MRI of her cervical spine.  This does show significant arthritic issues.  It sounds like she do not see may need to have another surgery.  As far as her myeloma is concerned, I think she is doing quite well. She is responding to the Velcade.  So far, everything is going quite well.  Her last monoclonal spike was 0.2 g/dL.  Her IgG level was 385 mg/dL.  Her kappa light chain was 1.2 mg/dL.  These were all nice and stable.  She has  had the coronavirus vaccine yet.  She and her husband both got the vaccine at the same time.    She has not had any problems with fever.  She has had no bleeding.  There is no change in bowel or bladder habits.  She has had no leg swelling.  Her appetite is good.  Overall, her performance status is ECOG 1.    Medications:  Allergies as of 10/06/2019      Reactions   Crestor [rosuvastatin Calcium]    Causes liver functions to be elevated      Medication List       Accurate as of October 06, 2019  2:19 PM. If you have any questions, ask your nurse or doctor.        ALPRAZolam 0.25 MG  tablet Commonly known as: XANAX Take 1 tablet (0.25 mg total) by mouth 2 (two) times daily as needed. for anxiety   amLODipine 5 MG tablet Commonly known as: NORVASC TAKE 1 TABLET(5 MG) BY MOUTH DAILY   aspirin 81 MG EC tablet Take 81 mg by mouth daily.   atorvastatin 20 MG tablet Commonly known as: LIPITOR TAKE 1 TABLET(20 MG) BY MOUTH DAILY What changed: See the new instructions.   buPROPion 300 MG 24 hr tablet Commonly known as: Wellbutrin XL Take 1 tablet (300 mg total) by mouth daily.   dexamethasone 4 MG tablet Commonly known as: DECADRON TAKE 3 TABLETS BY MOUTH 1 TIME A WEEK   escitalopram 20 MG tablet Commonly known as: LEXAPRO TAKE 1 TABLET(20 MG) BY MOUTH DAILY What changed: additional instructions   famciclovir 250 MG tablet Commonly known as: FAMVIR TAKE 1 TABLET(250 MG) BY MOUTH DAILY What changed: See the new instructions.   gabapentin 300 MG capsule Commonly known as: NEURONTIN Take 300 mg by mouth at bedtime.   IRON PO Take by mouth.   nebivolol 5 MG tablet Commonly known as: Bystolic Take 1 tablet (5 mg total) by mouth daily.   potassium chloride SA 20 MEQ tablet Commonly known  as: KLOR-CON TAKE 1 TABLET(20 MEQ) BY MOUTH DAILY   prochlorperazine 10 MG tablet Commonly known as: COMPAZINE TAKE 1 TABLET(10 MG) BY MOUTH EVERY 6 HOURS AS NEEDED FOR NAUSEA OR VOMITING What changed: See the new instructions.   Vitamin D 50 MCG (2000 UT) Caps Take 2,000 Units by mouth daily.       Allergies:  Allergies  Allergen Reactions  . Crestor [Rosuvastatin Calcium]     Causes liver functions to be elevated    Past Medical History, Surgical history, Social history, and Family History were reviewed and updated.  Review of Systems: Review of Systems  Constitutional: Negative.   HENT: Negative.   Eyes: Negative.   Respiratory: Negative.   Cardiovascular: Negative.   Gastrointestinal: Negative.   Genitourinary: Negative.   Musculoskeletal:  Positive for back pain.  Skin: Negative.   Neurological: Negative.   Endo/Heme/Allergies: Negative.   Psychiatric/Behavioral: Negative.     Physical Exam:  weight is 117 lb (53.1 kg). Her temporal temperature is 97.8 F (36.6 C). Her blood pressure is 114/62 and her pulse is 79. Her respiration is 20 and oxygen saturation is 100%.   Wt Readings from Last 3 Encounters:  10/06/19 117 lb (53.1 kg)  08/25/19 115 lb (52.2 kg)  07/12/19 116 lb (52.6 kg)    Physical Exam Vitals reviewed.  HENT:     Head: Normocephalic and atraumatic.  Eyes:     Pupils: Pupils are equal, round, and reactive to light.  Cardiovascular:     Rate and Rhythm: Normal rate and regular rhythm.     Heart sounds: Normal heart sounds.  Pulmonary:     Effort: Pulmonary effort is normal.     Breath sounds: Normal breath sounds.  Abdominal:     General: Bowel sounds are normal.     Palpations: Abdomen is soft.  Musculoskeletal:        General: No tenderness or deformity. Normal range of motion.     Cervical back: Normal range of motion.  Lymphadenopathy:     Cervical: No cervical adenopathy.  Skin:    General: Skin is warm and dry.     Findings: No erythema or rash.  Neurological:     Mental Status: She is alert and oriented to person, place, and time.  Psychiatric:        Behavior: Behavior normal.        Thought Content: Thought content normal.        Judgment: Judgment normal.       Lab Results  Component Value Date   WBC 6.9 10/06/2019   HGB 13.4 10/06/2019   HCT 40.9 10/06/2019   MCV 100.2 (H) 10/06/2019   PLT 250 10/06/2019   Lab Results  Component Value Date   FERRITIN 102 07/12/2019   IRON 110 07/12/2019   TIBC 315 07/12/2019   UIBC 205 07/12/2019   IRONPCTSAT 35 07/12/2019   Lab Results  Component Value Date   RBC 4.08 10/06/2019   Lab Results  Component Value Date   KPAFRELGTCHN 11.5 08/25/2019   LAMBDASER 3.8 (L) 08/25/2019   KAPLAMBRATIO 3.03 (H) 08/25/2019   Lab  Results  Component Value Date   IGGSERUM 384 (L) 08/25/2019   IGGSERUM 386 (L) 08/25/2019   IGA 26 (L) 08/25/2019   IGA 26 (L) 08/25/2019   IGMSERUM 23 (L) 08/25/2019   IGMSERUM 23 (L) 08/25/2019   Lab Results  Component Value Date   TOTALPROTELP 6.0 08/25/2019   ALBUMINELP 3.7 08/25/2019  A1GS 0.2 08/25/2019   A2GS 0.8 08/25/2019   BETS 0.9 08/25/2019   GAMS 0.4 08/25/2019   MSPIKE 0.2 (H) 08/25/2019   SPEI Comment 01/28/2018     Chemistry      Component Value Date/Time   NA 142 08/25/2019 1327   NA 148 (H) 07/16/2017 1128   NA 143 03/05/2017 1051   K 3.6 08/25/2019 1327   K 3.8 07/16/2017 1128   K 3.7 03/05/2017 1051   CL 107 08/25/2019 1327   CL 106 07/16/2017 1128   CO2 27 08/25/2019 1327   CO2 29 07/16/2017 1128   CO2 25 03/05/2017 1051   BUN 15 08/25/2019 1327   BUN 23 (H) 07/16/2017 1128   BUN 18.2 03/05/2017 1051   CREATININE 0.77 08/25/2019 1327   CREATININE 0.72 06/14/2019 0947   CREATININE 0.8 03/05/2017 1051      Component Value Date/Time   CALCIUM 9.4 08/25/2019 1327   CALCIUM 9.7 07/16/2017 1128   CALCIUM 8.9 03/05/2017 1051   ALKPHOS 61 08/25/2019 1327   ALKPHOS 71 07/16/2017 1128   ALKPHOS 67 03/05/2017 1051   AST 15 08/25/2019 1327   AST 15 03/05/2017 1051   ALT 16 08/25/2019 1327   ALT 23 07/16/2017 1128   ALT 17 03/05/2017 1051   BILITOT 0.5 08/25/2019 1327   BILITOT 0.55 03/05/2017 1051         Impression and Plan: Ms. Ratcliffe is a very pleasant 74 yo caucasian female with IgG Kappa Myeloma.    We will move her appointments to every 6 weeks again.  She really enjoys every 6-week follow-up.  This works well for her.  So far, this is been quite effective.  She will get the Velcade today.  She is really not anemic.  The iron that we had given her in the past definitely is helping.  She is taking oral iron which I am sure is helping.   [name], MD 3/17/20212:19 PM

## 2019-10-07 LAB — IRON AND TIBC
Iron: 168 ug/dL — ABNORMAL HIGH (ref 41–142)
Saturation Ratios: 51 % (ref 21–57)
TIBC: 326 ug/dL (ref 236–444)
UIBC: 159 ug/dL (ref 120–384)

## 2019-10-07 LAB — IGG, IGA, IGM
IgA: 24 mg/dL — ABNORMAL LOW (ref 64–422)
IgG (Immunoglobin G), Serum: 467 mg/dL — ABNORMAL LOW (ref 586–1602)
IgM (Immunoglobulin M), Srm: 28 mg/dL (ref 26–217)

## 2019-10-07 LAB — KAPPA/LAMBDA LIGHT CHAINS
Kappa free light chain: 18.2 mg/L (ref 3.3–19.4)
Kappa, lambda light chain ratio: 3.19 — ABNORMAL HIGH (ref 0.26–1.65)
Lambda free light chains: 5.7 mg/L (ref 5.7–26.3)

## 2019-10-07 LAB — FERRITIN: Ferritin: 128 ng/mL (ref 11–307)

## 2019-10-11 LAB — PROTEIN ELECTROPHORESIS, SERUM, WITH REFLEX
A/G Ratio: 1.7 (ref 0.7–1.7)
Albumin ELP: 3.9 g/dL (ref 2.9–4.4)
Alpha-1-Globulin: 0.2 g/dL (ref 0.0–0.4)
Alpha-2-Globulin: 0.7 g/dL (ref 0.4–1.0)
Beta Globulin: 0.9 g/dL (ref 0.7–1.3)
Gamma Globulin: 0.4 g/dL (ref 0.4–1.8)
Globulin, Total: 2.3 g/dL (ref 2.2–3.9)
M-Spike, %: 0.2 g/dL — ABNORMAL HIGH
SPEP Interpretation: 0
Total Protein ELP: 6.2 g/dL (ref 6.0–8.5)

## 2019-10-11 LAB — IMMUNOFIXATION REFLEX, SERUM
IgA: 27 mg/dL — ABNORMAL LOW (ref 64–422)
IgG (Immunoglobin G), Serum: 540 mg/dL — ABNORMAL LOW (ref 586–1602)
IgM (Immunoglobulin M), Srm: 27 mg/dL (ref 26–217)

## 2019-10-14 ENCOUNTER — Telehealth: Payer: Self-pay | Admitting: Internal Medicine

## 2019-10-14 NOTE — Telephone Encounter (Signed)
Debra Ruiz [phone]  [name] called to say she needs a refill on her below medication and would like to get a 90 day supply.   nebivolol (BYSTOLIC) 5 MG tablet  [id number] - [place], [place] - [address] Phone:  [phone]  Fax:  [phone]

## 2019-10-15 MED ORDER — NEBIVOLOL HCL 5 MG PO TABS: 5.0000 mg | ORAL_TABLET | Freq: Every day | ORAL | 5 refills | Status: DC

## 2019-10-19 ENCOUNTER — Encounter: Payer: Self-pay | Admitting: Hematology & Oncology

## 2019-10-20 DIAGNOSIS — D1801 Hemangioma of skin and subcutaneous tissue: Secondary | ICD-10-CM | POA: Diagnosis not present

## 2019-10-20 DIAGNOSIS — D220 Melanocytic nevi of lip: Secondary | ICD-10-CM | POA: Diagnosis not present

## 2019-10-20 DIAGNOSIS — L738 Other specified follicular disorders: Secondary | ICD-10-CM | POA: Diagnosis not present

## 2019-10-20 DIAGNOSIS — L821 Other seborrheic keratosis: Secondary | ICD-10-CM | POA: Diagnosis not present

## 2019-10-20 DIAGNOSIS — Z85828 Personal history of other malignant neoplasm of skin: Secondary | ICD-10-CM | POA: Diagnosis not present

## 2019-10-20 DIAGNOSIS — L814 Other melanin hyperpigmentation: Secondary | ICD-10-CM | POA: Diagnosis not present

## 2019-10-20 DIAGNOSIS — D692 Other nonthrombocytopenic purpura: Secondary | ICD-10-CM | POA: Diagnosis not present

## 2019-10-20 DIAGNOSIS — L570 Actinic keratosis: Secondary | ICD-10-CM | POA: Diagnosis not present

## 2019-11-10 NOTE — Progress Notes (Signed)
Pharmacist Chemotherapy Monitoring - Follow Up Assessment    I verify that I have reviewed each item in the below checklist:  . Regimen for the patient is scheduled for the appropriate day and plan matches scheduled date. Marland Kitchen Appropriate non-routine labs are ordered dependent on drug ordered. . If applicable, additional medications reviewed and ordered per protocol based on lifetime cumulative doses and/or treatment regimen.   Plan for follow-up and/or issues identified: No . I-vent associated with next due treatment: No . MD and/or nursing notified: No  Markise Haymer, Jacqlyn Larsen 11/10/2019 2:10 PM

## 2019-11-15 ENCOUNTER — Other Ambulatory Visit: Payer: Self-pay | Admitting: Internal Medicine

## 2019-11-15 ENCOUNTER — Other Ambulatory Visit: Payer: Self-pay | Admitting: Hematology & Oncology

## 2019-11-15 DIAGNOSIS — C9000 Multiple myeloma not having achieved remission: Secondary | ICD-10-CM

## 2019-11-15 DIAGNOSIS — E782 Mixed hyperlipidemia: Secondary | ICD-10-CM

## 2019-11-15 MED ORDER — ATORVASTATIN CALCIUM 20 MG PO TABS: 20.0000 mg | ORAL_TABLET | Freq: Every day | ORAL | 1 refills | Status: DC

## 2019-11-15 NOTE — Telephone Encounter (Signed)
Received Fax RX request from  [facility], [place] [address] Phone:  [phone]  Fax:  [phone]       Medication - atorvastatin (LIPITOR) 20 MG tablet   Last Refill - 08/17/19  Last OV - 06/21/19  Last CPE - 06/21/19  Next Appointment -

## 2019-11-15 NOTE — Telephone Encounter (Signed)
Book CPE in November before refilling x 6 months

## 2019-11-17 ENCOUNTER — Inpatient Hospital Stay: Payer: PPO | Attending: Hematology & Oncology

## 2019-11-17 ENCOUNTER — Inpatient Hospital Stay (HOSPITAL_BASED_OUTPATIENT_CLINIC_OR_DEPARTMENT_OTHER): Payer: PPO | Admitting: Hematology & Oncology

## 2019-11-17 ENCOUNTER — Other Ambulatory Visit: Payer: Self-pay

## 2019-11-17 ENCOUNTER — Inpatient Hospital Stay: Payer: PPO

## 2019-11-17 ENCOUNTER — Encounter: Payer: Self-pay | Admitting: Hematology & Oncology

## 2019-11-17 VITALS — BP 112/61 | HR 74 | Temp 96.9°F | Resp 18 | Wt 117.0 lb

## 2019-11-17 DIAGNOSIS — C9000 Multiple myeloma not having achieved remission: Secondary | ICD-10-CM | POA: Insufficient documentation

## 2019-11-17 DIAGNOSIS — Z79899 Other long term (current) drug therapy: Secondary | ICD-10-CM | POA: Diagnosis not present

## 2019-11-17 DIAGNOSIS — D509 Iron deficiency anemia, unspecified: Secondary | ICD-10-CM | POA: Insufficient documentation

## 2019-11-17 DIAGNOSIS — Z5112 Encounter for antineoplastic immunotherapy: Secondary | ICD-10-CM | POA: Insufficient documentation

## 2019-11-17 LAB — CBC WITH DIFFERENTIAL (CANCER CENTER ONLY)
Abs Immature Granulocytes: 0.07 10*3/uL (ref 0.00–0.07)
Basophils Absolute: 0 10*3/uL (ref 0.0–0.1)
Basophils Relative: 0 %
Eosinophils Absolute: 0.1 10*3/uL (ref 0.0–0.5)
Eosinophils Relative: 1 %
HCT: 40.3 % (ref 36.0–46.0)
Hemoglobin: 13.5 g/dL (ref 12.0–15.0)
Immature Granulocytes: 1 %
Lymphocytes Relative: 14 %
Lymphs Abs: 1.1 10*3/uL (ref 0.7–4.0)
MCH: 33.9 pg (ref 26.0–34.0)
MCHC: 33.5 g/dL (ref 30.0–36.0)
MCV: 101.3 fL — ABNORMAL HIGH (ref 80.0–100.0)
Monocytes Absolute: 0.5 10*3/uL (ref 0.1–1.0)
Monocytes Relative: 7 %
Neutro Abs: 6.2 10*3/uL (ref 1.7–7.7)
Neutrophils Relative %: 77 %
Platelet Count: 236 10*3/uL (ref 150–400)
RBC: 3.98 MIL/uL (ref 3.87–5.11)
RDW: 12.4 % (ref 11.5–15.5)
WBC Count: 8 10*3/uL (ref 4.0–10.5)
nRBC: 0 % (ref 0.0–0.2)

## 2019-11-17 LAB — CMP (CANCER CENTER ONLY)
ALT: 13 U/L (ref 0–44)
AST: 13 U/L — ABNORMAL LOW (ref 15–41)
Albumin: 4.3 g/dL (ref 3.5–5.0)
Alkaline Phosphatase: 56 U/L (ref 38–126)
Anion gap: 7 (ref 5–15)
BUN: 19 mg/dL (ref 8–23)
CO2: 30 mmol/L (ref 22–32)
Calcium: 9.2 mg/dL (ref 8.9–10.3)
Chloride: 108 mmol/L (ref 98–111)
Creatinine: 0.8 mg/dL (ref 0.44–1.00)
GFR, Est AFR Am: >60 mL/min (ref >60)
GFR, Estimated: >60 mL/min (ref >60)
Glucose, Bld: 147 mg/dL — ABNORMAL HIGH (ref 70–99)
Potassium: 3.9 mmol/L (ref 3.5–5.1)
Sodium: 145 mmol/L (ref 135–145)
Total Bilirubin: 0.5 mg/dL (ref 0.3–1.2)
Total Protein: 6.3 g/dL — ABNORMAL LOW (ref 6.5–8.1)

## 2019-11-17 MED ORDER — TEMAZEPAM 7.5 MG PO CAPS
7.5000 mg | ORAL_CAPSULE | Freq: Every evening | ORAL | 0 refills | Status: DC | PRN
Start: 2019-11-17 — End: 2020-01-17

## 2019-11-17 MED ORDER — BORTEZOMIB CHEMO SQ INJECTION 3.5 MG (2.5MG/ML)
1.3000 mg/m2 | Freq: Once | INTRAMUSCULAR | Status: AC
  Administered 2019-11-17: 2 mg via SUBCUTANEOUS
  Filled 2019-11-17: qty 0.8

## 2019-11-17 MED ORDER — PROCHLORPERAZINE MALEATE 10 MG PO TABS: 10.0000 mg | ORAL_TABLET | Freq: Once | ORAL | Status: DC

## 2019-11-17 NOTE — Patient Instructions (Signed)
[facility] Discharge Instructions for Patients Receiving Chemotherapy  Today you received the following chemotherapy agents:  Velcade  To help prevent nausea and vomiting after your treatment, we encourage you to take your nausea medication as ordered per MD.    If you develop nausea and vomiting that is not controlled by your nausea medication, call the clinic.   BELOW ARE SYMPTOMS THAT SHOULD BE REPORTED IMMEDIATELY:  *FEVER GREATER THAN 100.5 F  *CHILLS WITH OR WITHOUT FEVER  NAUSEA AND VOMITING THAT IS NOT CONTROLLED WITH YOUR NAUSEA MEDICATION  *UNUSUAL SHORTNESS OF BREATH  *UNUSUAL BRUISING OR BLEEDING  TENDERNESS IN MOUTH AND THROAT WITH OR WITHOUT PRESENCE OF ULCERS  *URINARY PROBLEMS  *BOWEL PROBLEMS  UNUSUAL RASH Items with * indicate a potential emergency and should be followed up as soon as possible.  Feel free to call the clinic should you have any questions or concerns. The clinic phone number is [phone].  Please show the CHEMO ALERT CARD at check-in to the Emergency Department and triage nurse.   

## 2019-11-17 NOTE — Progress Notes (Signed)
Hematology and Oncology Follow Up Visit  Debra Ruiz [id number] 1945/08/02 74 y.o. 11/17/2019   Principle Diagnosis:  IgG Kappa myeloma - compression fractures - 13q-, +11, 14q+ - hyperdiploid -- progressive Iron def anemia  Completed Therapy: S/p Kyphoplasty of T11 and T12 XRT to lower spine-completed on 09/25/2016  Current Therapy:   Velcade/Pomalyst/Decadron -Start on 09/02/2018 --Pomalyst on hold since 11/09/2018. Xgeva 181m Sq q  3 month - next dose in July 2021  IV Injectafer -- last dose given on 11/2018   Interim History:  Ms. JAcreyis here today for follow-up.  She is doing okay.  She is having some problems with her neck again.  Sounds like she has some radicular pain in her left arm.  She is on Cymbalta.  She says this really does not help her.  I think she goes back to see the orthopedic surgeon.  The big news is that she is going to have a new kitten.  She is awaiting the kitten for adoption.  [place]  He does not weigh 2 pounds yet.  Debra Ruiz has been doing quite well.  Her last M spike was 0.2 g/dL.  The IgG level was 500 mg/dL.  The kappa light chain was 1.8 mg/dL.  She is not sleeping at all.  I will try her on some Restoril at 7.5 mg p.o. nightly as needed.  She has had no problems with bowels or bladder.  She has had no rashes.  She has had no leg swelling.  She has had no problem with infections.  Her appetite seems to be doing pretty well.  She is not walking as much as she would like.  Overall, her performance status is ECOG 1.     Medications:  Allergies as of 11/17/2019      Reactions   Crestor [rosuvastatin Calcium]    Causes liver functions to be elevated      Medication List       Accurate as of November 17, 2019 11:23 AM. If you have any questions, ask your nurse or doctor.        ALPRAZolam 0.25 MG tablet Commonly known as: XANAX Take 1 tablet (0.25 mg total) by mouth 2 (two)  times daily as needed. for anxiety   amLODipine 5 MG tablet Commonly known as: NORVASC TAKE 1 TABLET(5 MG) BY MOUTH DAILY   aspirin 81 MG EC tablet Take 81 mg by mouth daily.   atorvastatin 20 MG tablet Commonly known as: LIPITOR Take 1 tablet (20 mg total) by mouth daily. TAKE 1 TABLET(20 MG) BY MOUTH DAILY   buPROPion 300 MG 24 hr tablet Commonly known as: Wellbutrin XL Take 1 tablet (300 mg total) by mouth daily.   dexamethasone 4 MG tablet Commonly known as: DECADRON TAKE 3 TABLETS BY MOUTH 1 TIME A WEEK   escitalopram 20 MG tablet Commonly known as: LEXAPRO TAKE 1 TABLET(20 MG) BY MOUTH DAILY What changed: additional instructions   famciclovir 250 MG tablet Commonly known as: FAMVIR TAKE 1 TABLET(250 MG) BY MOUTH DAILY   gabapentin 300 MG capsule Commonly known as: NEURONTIN Take 300 mg by mouth at bedtime.   IRON PO Take by mouth.   nebivolol 5 MG tablet Commonly known as: Bystolic Take 1 tablet (5 mg total) by mouth daily.   potassium chloride SA 20 MEQ tablet Commonly known as: KLOR-CON TAKE 1 TABLET(20 MEQ) BY MOUTH DAILY   prochlorperazine 10 MG tablet  Commonly known as: COMPAZINE TAKE 1 TABLET(10 MG) BY MOUTH EVERY 6 HOURS AS NEEDED FOR NAUSEA OR VOMITING What changed: See the new instructions.   Vitamin D 50 MCG (2000 UT) Caps Take 2,000 Units by mouth daily.       Allergies:  Allergies  Allergen Reactions  . Crestor [Rosuvastatin Calcium]     Causes liver functions to be elevated    Past Medical History, Surgical history, Social history, and Family History were reviewed and updated.  Review of Systems: Review of Systems  Constitutional: Negative.   HENT: Negative.   Eyes: Negative.   Respiratory: Negative.   Cardiovascular: Negative.   Gastrointestinal: Negative.   Genitourinary: Negative.   Musculoskeletal: Positive for back pain.  Skin: Negative.   Neurological: Negative.   Endo/Heme/Allergies: Negative.     Psychiatric/Behavioral: Negative.     Physical Exam:  weight is 117 lb (53.1 kg). Her temporal temperature is 96.9 F (36.1 C) (abnormal). Her blood pressure is 112/61 and her pulse is 74. Her respiration is 18 and oxygen saturation is 100%.   Wt Readings from Last 3 Encounters:  11/17/19 117 lb (53.1 kg)  10/06/19 117 lb (53.1 kg)  08/25/19 115 lb (52.2 kg)    Physical Exam Vitals reviewed.  HENT:     Head: Normocephalic and atraumatic.  Eyes:     Pupils: Pupils are equal, round, and reactive to light.  Cardiovascular:     Rate and Rhythm: Normal rate and regular rhythm.     Heart sounds: Normal heart sounds.  Pulmonary:     Effort: Pulmonary effort is normal.     Breath sounds: Normal breath sounds.  Abdominal:     General: Bowel sounds are normal.     Palpations: Abdomen is soft.  Musculoskeletal:        General: No tenderness or deformity. Normal range of motion.     Cervical back: Normal range of motion.  Lymphadenopathy:     Cervical: No cervical adenopathy.  Skin:    General: Skin is warm and dry.     Findings: No erythema or rash.  Neurological:     Mental Status: She is alert and oriented to person, place, and time.  Psychiatric:        Behavior: Behavior normal.        Thought Content: Thought content normal.        Judgment: Judgment normal.       Lab Results  Component Value Date   WBC 8.0 11/17/2019   HGB 13.5 11/17/2019   HCT 40.3 11/17/2019   MCV 101.3 (H) 11/17/2019   PLT 236 11/17/2019   Lab Results  Component Value Date   FERRITIN 128 10/06/2019   IRON 168 (H) 10/06/2019   TIBC 326 10/06/2019   UIBC 159 10/06/2019   IRONPCTSAT 51 10/06/2019   Lab Results  Component Value Date   RBC 3.98 11/17/2019   Lab Results  Component Value Date   KPAFRELGTCHN 18.2 10/06/2019   LAMBDASER 5.7 10/06/2019   KAPLAMBRATIO 3.19 (H) 10/06/2019   Lab Results  Component Value Date   IGGSERUM 467 (L) 10/06/2019   IGGSERUM 540 (L) 10/06/2019    IGA 24 (L) 10/06/2019   IGA 27 (L) 10/06/2019   IGMSERUM 28 10/06/2019   IGMSERUM 27 10/06/2019   Lab Results  Component Value Date   TOTALPROTELP 6.2 10/06/2019   ALBUMINELP 3.9 10/06/2019   A1GS 0.2 10/06/2019   A2GS 0.7 10/06/2019   BETS 0.9 10/06/2019  GAMS 0.4 10/06/2019   MSPIKE 0.2 (H) 10/06/2019   SPEI Comment 01/28/2018     Chemistry      Component Value Date/Time   NA 145 11/17/2019 1004   NA 148 (H) 07/16/2017 1128   NA 143 03/05/2017 1051   K 3.9 11/17/2019 1004   K 3.8 07/16/2017 1128   K 3.7 03/05/2017 1051   CL 108 11/17/2019 1004   CL 106 07/16/2017 1128   CO2 30 11/17/2019 1004   CO2 29 07/16/2017 1128   CO2 25 03/05/2017 1051   BUN 19 11/17/2019 1004   BUN 23 (H) 07/16/2017 1128   BUN 18.2 03/05/2017 1051   CREATININE 0.80 11/17/2019 1004   CREATININE 0.72 06/14/2019 0947   CREATININE 0.8 03/05/2017 1051      Component Value Date/Time   CALCIUM 9.2 11/17/2019 1004   CALCIUM 9.7 07/16/2017 1128   CALCIUM 8.9 03/05/2017 1051   ALKPHOS 56 11/17/2019 1004   ALKPHOS 71 07/16/2017 1128   ALKPHOS 67 03/05/2017 1051   AST 13 (L) 11/17/2019 1004   AST 15 03/05/2017 1051   ALT 13 11/17/2019 1004   ALT 23 07/16/2017 1128   ALT 17 03/05/2017 1051   BILITOT 0.5 11/17/2019 1004   BILITOT 0.55 03/05/2017 1051         Impression and Plan: Ms. Leite is a very pleasant 74 yo caucasian female with IgG Kappa Myeloma.    Hopefully, she will continue to respond to treatment.  She likes the every 6-week follow-up.  I am very excited about her getting a kitten.  She thinks that this will really help she and her husband.  Overall, we will plan to get her back in 6 weeks.  She will get her Delton See today.  I hope that the orthopedic surgeon will be able to help with her radiculopathy without having surgery.   [name], MD 4/28/202111:23 AM

## 2019-11-18 LAB — KAPPA/LAMBDA LIGHT CHAINS
Kappa free light chain: 34.5 mg/L — ABNORMAL HIGH (ref 3.3–19.4)
Kappa, lambda light chain ratio: 10.15 — ABNORMAL HIGH (ref 0.26–1.65)
Lambda free light chains: 3.4 mg/L — ABNORMAL LOW (ref 5.7–26.3)

## 2019-11-18 LAB — IGG, IGA, IGM
IgA: 21 mg/dL — ABNORMAL LOW (ref 64–422)
IgG (Immunoglobin G), Serum: 552 mg/dL — ABNORMAL LOW (ref 586–1602)
IgM (Immunoglobulin M), Srm: 26 mg/dL (ref 26–217)

## 2019-11-23 LAB — PROTEIN ELECTROPHORESIS, SERUM, WITH REFLEX
A/G Ratio: 1.5 (ref 0.7–1.7)
Albumin ELP: 3.7 g/dL (ref 2.9–4.4)
Alpha-1-Globulin: 0.2 g/dL (ref 0.0–0.4)
Alpha-2-Globulin: 0.7 g/dL (ref 0.4–1.0)
Beta Globulin: 0.9 g/dL (ref 0.7–1.3)
Gamma Globulin: 0.5 g/dL (ref 0.4–1.8)
Globulin, Total: 2.4 g/dL (ref 2.2–3.9)
M-Spike, %: 0.3 g/dL — ABNORMAL HIGH
SPEP Interpretation: 0
Total Protein ELP: 6.1 g/dL (ref 6.0–8.5)

## 2019-11-23 LAB — IMMUNOFIXATION REFLEX, SERUM
IgA: 21 mg/dL — ABNORMAL LOW (ref 64–422)
IgG (Immunoglobin G), Serum: 583 mg/dL — ABNORMAL LOW (ref 586–1602)
IgM (Immunoglobulin M), Srm: 24 mg/dL — ABNORMAL LOW (ref 26–217)

## 2019-12-02 ENCOUNTER — Other Ambulatory Visit: Payer: Self-pay

## 2019-12-02 MED ORDER — ESCITALOPRAM OXALATE 20 MG PO TABS: ORAL_TABLET | ORAL | 2 refills | Status: DC

## 2019-12-02 NOTE — Addendum Note (Signed)
Addended by: Mady Haagensen on: 12/02/2019 09:32 AM   Modules accepted: Orders

## 2019-12-22 NOTE — Progress Notes (Addendum)
Give with Delton See with July 2021 appointment, not June, per Dr. Marin Olp      Pharmacist Chemotherapy Monitoring - Follow Up Assessment    I verify that I have reviewed each item in the below checklist:  . Regimen for the patient is scheduled for the appropriate day and plan matches scheduled date. Marland Kitchen Appropriate non-routine labs are ordered dependent on drug ordered. . If applicable, additional medications reviewed and ordered per protocol based on lifetime cumulative doses and/or treatment regimen.   Plan for follow-up and/or issues identified: No . I-vent associated with next due treatment: Yes . MD and/or nursing notified: No  Sakiyah Shur, Jacqlyn Larsen 12/22/2019 8:39 AM

## 2019-12-29 ENCOUNTER — Inpatient Hospital Stay (HOSPITAL_BASED_OUTPATIENT_CLINIC_OR_DEPARTMENT_OTHER): Payer: PPO | Admitting: Hematology & Oncology

## 2019-12-29 ENCOUNTER — Encounter: Payer: Self-pay | Admitting: Hematology & Oncology

## 2019-12-29 ENCOUNTER — Other Ambulatory Visit: Payer: Self-pay

## 2019-12-29 ENCOUNTER — Inpatient Hospital Stay: Payer: PPO | Attending: Hematology & Oncology

## 2019-12-29 ENCOUNTER — Inpatient Hospital Stay: Payer: PPO

## 2019-12-29 VITALS — BP 126/86 | HR 71 | Temp 96.8°F | Resp 20 | Wt 116.0 lb

## 2019-12-29 DIAGNOSIS — Z5112 Encounter for antineoplastic immunotherapy: Secondary | ICD-10-CM | POA: Insufficient documentation

## 2019-12-29 DIAGNOSIS — C9000 Multiple myeloma not having achieved remission: Secondary | ICD-10-CM

## 2019-12-29 LAB — CBC WITH DIFFERENTIAL (CANCER CENTER ONLY)
Abs Immature Granulocytes: 0.05 10*3/uL (ref 0.00–0.07)
Basophils Absolute: 0 10*3/uL (ref 0.0–0.1)
Basophils Relative: 0 %
Eosinophils Absolute: 0.1 10*3/uL (ref 0.0–0.5)
Eosinophils Relative: 1 %
HCT: 41.2 % (ref 36.0–46.0)
Hemoglobin: 13.8 g/dL (ref 12.0–15.0)
Immature Granulocytes: 1 %
Lymphocytes Relative: 14 %
Lymphs Abs: 1 10*3/uL (ref 0.7–4.0)
MCH: 33.7 pg (ref 26.0–34.0)
MCHC: 33.5 g/dL (ref 30.0–36.0)
MCV: 100.7 fL — ABNORMAL HIGH (ref 80.0–100.0)
Monocytes Absolute: 0.7 10*3/uL (ref 0.1–1.0)
Monocytes Relative: 9 %
Neutro Abs: 5.5 10*3/uL (ref 1.7–7.7)
Neutrophils Relative %: 75 %
Platelet Count: 225 10*3/uL (ref 150–400)
RBC: 4.09 MIL/uL (ref 3.87–5.11)
RDW: 11.9 % (ref 11.5–15.5)
WBC Count: 7.3 10*3/uL (ref 4.0–10.5)
nRBC: 0 % (ref 0.0–0.2)

## 2019-12-29 LAB — CMP (CANCER CENTER ONLY)
ALT: 13 U/L (ref 0–44)
AST: 15 U/L (ref 15–41)
Albumin: 4.3 g/dL (ref 3.5–5.0)
Alkaline Phosphatase: 54 U/L (ref 38–126)
Anion gap: 8 (ref 5–15)
BUN: 17 mg/dL (ref 8–23)
CO2: 27 mmol/L (ref 22–32)
Calcium: 9.4 mg/dL (ref 8.9–10.3)
Chloride: 106 mmol/L (ref 98–111)
Creatinine: 0.76 mg/dL (ref 0.44–1.00)
GFR, Est AFR Am: >60 mL/min (ref >60)
GFR, Estimated: >60 mL/min (ref >60)
Glucose, Bld: 162 mg/dL — ABNORMAL HIGH (ref 70–99)
Potassium: 3.9 mmol/L (ref 3.5–5.1)
Sodium: 141 mmol/L (ref 135–145)
Total Bilirubin: 0.5 mg/dL (ref 0.3–1.2)
Total Protein: 6.6 g/dL (ref 6.5–8.1)

## 2019-12-29 MED ORDER — BORTEZOMIB CHEMO SQ INJECTION 3.5 MG (2.5MG/ML)
1.3000 mg/m2 | Freq: Once | INTRAMUSCULAR | Status: AC
  Administered 2019-12-29: 2 mg via SUBCUTANEOUS
  Filled 2019-12-29: qty 0.8

## 2019-12-29 MED ORDER — PROCHLORPERAZINE MALEATE 10 MG PO TABS: 10.0000 mg | ORAL_TABLET | Freq: Once | ORAL | Status: DC

## 2019-12-29 NOTE — Patient Instructions (Signed)
Bortezomib injection What is this medicine? BORTEZOMIB (bor TEZ oh mib) is a medicine that targets proteins in cancer cells and stops the cancer cells from growing. It is used to treat multiple myeloma and mantle-cell lymphoma. This medicine may be used for other purposes; ask your health care provider or pharmacist if you have questions. COMMON BRAND NAME(S): Velcade What should I tell my health care provider before I take this medicine? They need to know if you have any of these conditions:  diabetes  heart disease  irregular heartbeat  liver disease  on hemodialysis  low blood counts, like low white blood cells, platelets, or hemoglobin  peripheral neuropathy  taking medicine for blood pressure  an unusual or allergic reaction to bortezomib, mannitol, boron, other medicines, foods, dyes, or preservatives  pregnant or trying to get pregnant  breast-feeding How should I use this medicine? This medicine is for injection into a vein or for injection under the skin. It is given by a health care professional in a hospital or clinic setting. Talk to your pediatrician regarding the use of this medicine in children. Special care may be needed. Overdosage: If you think you have taken too much of this medicine contact a poison control center or emergency room at once. NOTE: This medicine is only for you. Do not share this medicine with others. What if I miss a dose? It is important not to miss your dose. Call your doctor or health care professional if you are unable to keep an appointment. What may interact with this medicine? This medicine may interact with the following medications:  ketoconazole  rifampin  ritonavir  St. John's Wort This list may not describe all possible interactions. Give your health care provider a list of all the medicines, herbs, non-prescription drugs, or dietary supplements you use. Also tell them if you smoke, drink alcohol, or use illegal drugs. Some  items may interact with your medicine. What should I watch for while using this medicine? You may get drowsy or dizzy. Do not drive, use machinery, or do anything that needs mental alertness until you know how this medicine affects you. Do not stand or sit up quickly, especially if you are an older patient. This reduces the risk of dizzy or fainting spells. In some cases, you may be given additional medicines to help with side effects. Follow all directions for their use. Call your doctor or health care professional for advice if you get a fever, chills or sore throat, or other symptoms of a cold or flu. Do not treat yourself. This drug decreases your body's ability to fight infections. Try to avoid being around people who are sick. This medicine may increase your risk to bruise or bleed. Call your doctor or health care professional if you notice any unusual bleeding. You may need blood work done while you are taking this medicine. In some patients, this medicine may cause a serious brain infection that may cause death. If you have any problems seeing, thinking, speaking, walking, or standing, tell your doctor right away. If you cannot reach your doctor, urgently seek other source of medical care. Check with your doctor or health care professional if you get an attack of severe diarrhea, nausea and vomiting, or if you sweat a lot. The loss of too much body fluid can make it dangerous for you to take this medicine. Do not become pregnant while taking this medicine or for at least 7 months after stopping it. Women should inform their doctor   if they wish to become pregnant or think they might be pregnant. Men should not father a child while taking this medicine and for at least 4 months after stopping it. There is a potential for serious side effects to an unborn child. Talk to your health care professional or pharmacist for more information. Do not breast-feed an infant while taking this medicine or for 2  months after stopping it. This medicine may interfere with the ability to have a child. You should talk with your doctor or health care professional if you are concerned about your fertility. What side effects may I notice from receiving this medicine? Side effects that you should report to your doctor or health care professional as soon as possible:  allergic reactions like skin rash, itching or hives, swelling of the face, lips, or tongue  breathing problems  changes in hearing  changes in vision  fast, irregular heartbeat  feeling faint or lightheaded, falls  pain, tingling, numbness in the hands or feet  right upper belly pain  seizures  swelling of the ankles, feet, hands  unusual bleeding or bruising  unusually weak or tired  vomiting  yellowing of the eyes or skin Side effects that usually do not require medical attention (report to your doctor or health care professional if they continue or are bothersome):  changes in emotions or moods  constipation  diarrhea  loss of appetite  headache  irritation at site where injected  nausea This list may not describe all possible side effects. Call your doctor for medical advice about side effects. You may report side effects to FDA at 1-800-FDA-1088. Where should I keep my medicine? This drug is given in a hospital or clinic and will not be stored at home. NOTE: This sheet is a summary. It may not cover all possible information. If you have questions about this medicine, talk to your doctor, pharmacist, or health care provider.  2020 Elsevier/Gold Standard (2017-11-17 16:29:31)  

## 2019-12-29 NOTE — Progress Notes (Signed)
Hematology and Oncology Follow Up Visit  Debra Ruiz [id number] 20-Jan-1946 73 y.o. 12/29/2019   Principle Diagnosis:  IgG Kappa myeloma - compression fractures - 13q-, +11, 14q+ - hyperdiploid -- progressive Iron def anemia  Completed Therapy: S/p Kyphoplasty of T11 and T12 XRT to lower spine-completed on 09/25/2016  Current Therapy:   Velcade/Pomalyst/Decadron -Start on 09/02/2018 --Pomalyst on hold since 11/09/2018. Xgeva '120mg'$  Sq q  3 month - next dose in July 2021  IV Injectafer -- last dose given on 11/2018   Interim History:  Debra Ruiz is here today for follow-up.  She is doing okay.  Unfortunately, she is having issues with her neck.  She still has numbness in her arms.  She says that she may need to have more neck surgery.  She does not want to get more neck surgery.  She showed me pictures of her new kitten.  The kitten's name is [place].  She is very adorable.  She is 60 weeks old.  Debra Ruiz and her husband are both 11 her a lot and really enjoyed having her around.  I am a little worried regarding the myeloma.  Last summer saw her, the M spike was 0.3 g/dL.  The IgG level was a bit higher at 565 mg/dL.  The light chain was up to 3.5 mg/dL.  She really does not want to come in more frequently.  She knows that she will need to if the M spike continues to go up.  I told her that if the M spike continues to climb, we may have to make a change to a different treatment.  She has had no problems with bowels or bladder.  She has had no rashes.  She has had no swelling in her legs.  Overall, her performance status is ECOG 1.     Medications:  Allergies as of 12/29/2019      Reactions   Crestor [rosuvastatin Calcium]    Causes liver functions to be elevated      Medication List       Accurate as of December 29, 2019  2:51 PM. If you have any questions, ask your nurse or doctor.        ALPRAZolam 0.25 MG tablet Commonly known as: XANAX Take 1 tablet (0.25 mg total) by  mouth 2 (two) times daily as needed. for anxiety   amLODipine 5 MG tablet Commonly known as: NORVASC TAKE 1 TABLET(5 MG) BY MOUTH DAILY   aspirin 81 MG EC tablet Take 81 mg by mouth daily.   atorvastatin 20 MG tablet Commonly known as: LIPITOR Take 1 tablet (20 mg total) by mouth daily. TAKE 1 TABLET(20 MG) BY MOUTH DAILY   buPROPion 300 MG 24 hr tablet Commonly known as: Wellbutrin XL Take 1 tablet (300 mg total) by mouth daily.   dexamethasone 4 MG tablet Commonly known as: DECADRON TAKE 3 TABLETS BY MOUTH 1 TIME A WEEK   escitalopram 20 MG tablet Commonly known as: LEXAPRO TAKE 1 TABLET(20 MG) BY MOUTH DAILY   famciclovir 250 MG tablet Commonly known as: FAMVIR TAKE 1 TABLET(250 MG) BY MOUTH DAILY   gabapentin 300 MG capsule Commonly known as: NEURONTIN Take 300 mg by mouth at bedtime.   IRON PO Take by mouth.   nebivolol 5 MG tablet Commonly known as: Bystolic Take 1 tablet (5 mg total) by mouth daily.   potassium chloride SA 20 MEQ tablet Commonly known as: KLOR-CON TAKE 1 TABLET(20 MEQ) BY MOUTH DAILY  prochlorperazine 10 MG tablet Commonly known as: COMPAZINE TAKE 1 TABLET(10 MG) BY MOUTH EVERY 6 HOURS AS NEEDED FOR NAUSEA OR VOMITING What changed: See the new instructions.   temazepam 7.5 MG capsule Commonly known as: RESTORIL Take 1 capsule (7.5 mg total) by mouth at bedtime as needed for sleep.   Vitamin B-12 5000 MCG Tbdp Take by mouth.   Vitamin D 50 MCG (2000 UT) Caps Take 2,000 Units by mouth daily.       Allergies:  Allergies  Allergen Reactions  . Crestor [Rosuvastatin Calcium]     Causes liver functions to be elevated    Past Medical History, Surgical history, Social history, and Family History were reviewed and updated.  Review of Systems: Review of Systems  Constitutional: Negative.   HENT: Negative.   Eyes: Negative.   Respiratory: Negative.   Cardiovascular: Negative.   Gastrointestinal: Negative.   Genitourinary:  Negative.   Musculoskeletal: Positive for back pain.  Skin: Negative.   Neurological: Negative.   Endo/Heme/Allergies: Negative.   Psychiatric/Behavioral: Negative.     Physical Exam:  weight is 116 lb (52.6 kg). Her temporal temperature is 96.8 F (36 C) (abnormal). Her blood pressure is 126/86 and her pulse is 71. Her respiration is 20 and oxygen saturation is 100%.   Wt Readings from Last 3 Encounters:  12/29/19 116 lb (52.6 kg)  11/17/19 117 lb (53.1 kg)  10/06/19 117 lb (53.1 kg)    Physical Exam Vitals reviewed.  HENT:     Head: Normocephalic and atraumatic.  Eyes:     Pupils: Pupils are equal, round, and reactive to light.  Cardiovascular:     Rate and Rhythm: Normal rate and regular rhythm.     Heart sounds: Normal heart sounds.  Pulmonary:     Effort: Pulmonary effort is normal.     Breath sounds: Normal breath sounds.  Abdominal:     General: Bowel sounds are normal.     Palpations: Abdomen is soft.  Musculoskeletal:        General: No tenderness or deformity. Normal range of motion.     Cervical back: Normal range of motion.  Lymphadenopathy:     Cervical: No cervical adenopathy.  Skin:    General: Skin is warm and dry.     Findings: No erythema or rash.  Neurological:     Mental Status: She is alert and oriented to person, place, and time.  Psychiatric:        Behavior: Behavior normal.        Thought Content: Thought content normal.        Judgment: Judgment normal.       Lab Results  Component Value Date   WBC 7.3 12/29/2019   HGB 13.8 12/29/2019   HCT 41.2 12/29/2019   MCV 100.7 (H) 12/29/2019   PLT 225 12/29/2019   Lab Results  Component Value Date   FERRITIN 128 10/06/2019   IRON 168 (H) 10/06/2019   TIBC 326 10/06/2019   UIBC 159 10/06/2019   IRONPCTSAT 51 10/06/2019   Lab Results  Component Value Date   RBC 4.09 12/29/2019   Lab Results  Component Value Date   KPAFRELGTCHN 34.5 (H) 11/17/2019   LAMBDASER 3.4 (L) 11/17/2019     KAPLAMBRATIO 10.15 (H) 11/17/2019   Lab Results  Component Value Date   IGGSERUM 552 (L) 11/17/2019   IGGSERUM 583 (L) 11/17/2019   IGA 21 (L) 11/17/2019   IGA 21 (L) 11/17/2019   IGMSERUM 26 11/17/2019  IGMSERUM 24 (L) 11/17/2019   Lab Results  Component Value Date   TOTALPROTELP 6.1 11/17/2019   ALBUMINELP 3.7 11/17/2019   A1GS 0.2 11/17/2019   A2GS 0.7 11/17/2019   BETS 0.9 11/17/2019   GAMS 0.5 11/17/2019   MSPIKE 0.3 (H) 11/17/2019   SPEI Comment 01/28/2018     Chemistry      Component Value Date/Time   NA 141 12/29/2019 1343   NA 148 (H) 07/16/2017 1128   NA 143 03/05/2017 1051   K 3.9 12/29/2019 1343   K 3.8 07/16/2017 1128   K 3.7 03/05/2017 1051   CL 106 12/29/2019 1343   CL 106 07/16/2017 1128   CO2 27 12/29/2019 1343   CO2 29 07/16/2017 1128   CO2 25 03/05/2017 1051   BUN 17 12/29/2019 1343   BUN 23 (H) 07/16/2017 1128   BUN 18.2 03/05/2017 1051   CREATININE 0.76 12/29/2019 1343   CREATININE 0.72 06/14/2019 0947   CREATININE 0.8 03/05/2017 1051      Component Value Date/Time   CALCIUM 9.4 12/29/2019 1343   CALCIUM 9.7 07/16/2017 1128   CALCIUM 8.9 03/05/2017 1051   ALKPHOS 54 12/29/2019 1343   ALKPHOS 71 07/16/2017 1128   ALKPHOS 67 03/05/2017 1051   AST 15 12/29/2019 1343   AST 15 03/05/2017 1051   ALT 13 12/29/2019 1343   ALT 23 07/16/2017 1128   ALT 17 03/05/2017 1051   BILITOT 0.5 12/29/2019 1343   BILITOT 0.55 03/05/2017 1051         Impression and Plan: Ms. Renville is a very pleasant 74 yo caucasian female with IgG Kappa Myeloma.    Hopefully, we will find that the M spike is improving.  Again, if not, we will have to either increase the frequency of her treatments or change to something different or add something to the Velcade.  I know that daratumumab would be reasonable as this is subcutaneous.  However, I do not think this choice would like to have to be here weekly for this.   Hopefully, she will not need surgery for her neck  again.  We will still keep follow-up in 6 weeks and we can change that if we need to depending on the myeloma panel.   [name], MD 6/9/20212:51 PM

## 2019-12-30 LAB — KAPPA/LAMBDA LIGHT CHAINS
Kappa free light chain: 72.7 mg/L — ABNORMAL HIGH (ref 3.3–19.4)
Kappa, lambda light chain ratio: 17.73 — ABNORMAL HIGH (ref 0.26–1.65)
Lambda free light chains: 4.1 mg/L — ABNORMAL LOW (ref 5.7–26.3)

## 2019-12-30 LAB — IGG, IGA, IGM
IgA: 20 mg/dL — ABNORMAL LOW (ref 64–422)
IgG (Immunoglobin G), Serum: 904 mg/dL (ref 586–1602)
IgM (Immunoglobulin M), Srm: 32 mg/dL (ref 26–217)

## 2019-12-31 LAB — PROTEIN ELECTROPHORESIS, SERUM, WITH REFLEX
A/G Ratio: 1.5 (ref 0.7–1.7)
Albumin ELP: 3.9 g/dL (ref 2.9–4.4)
Alpha-1-Globulin: 0.2 g/dL (ref 0.0–0.4)
Alpha-2-Globulin: 0.7 g/dL (ref 0.4–1.0)
Beta Globulin: 0.9 g/dL (ref 0.7–1.3)
Gamma Globulin: 0.8 g/dL (ref 0.4–1.8)
Globulin, Total: 2.6 g/dL (ref 2.2–3.9)
M-Spike, %: 0.6 g/dL — ABNORMAL HIGH
SPEP Interpretation: 0
Total Protein ELP: 6.5 g/dL (ref 6.0–8.5)

## 2019-12-31 LAB — IMMUNOFIXATION REFLEX, SERUM
IgA: 21 mg/dL — ABNORMAL LOW (ref 64–422)
IgG (Immunoglobin G), Serum: 901 mg/dL (ref 586–1602)
IgM (Immunoglobulin M), Srm: 33 mg/dL (ref 26–217)

## 2020-01-04 DIAGNOSIS — M542 Cervicalgia: Secondary | ICD-10-CM | POA: Diagnosis not present

## 2020-01-04 DIAGNOSIS — G959 Disease of spinal cord, unspecified: Secondary | ICD-10-CM | POA: Diagnosis not present

## 2020-01-06 ENCOUNTER — Telehealth: Payer: Self-pay | Admitting: *Deleted

## 2020-01-06 NOTE — Telephone Encounter (Signed)
Message received from patient stating she would like to move her appts up to four weeks instead of six weeks, since her M-spike has increased and she is aware that Dr. Marin Olp will be changing her treatments.  Dr. Marin Olp notified and would like for pt to come in in two weeks.  Pt notified and message sent to scheduling.

## 2020-01-07 ENCOUNTER — Encounter: Payer: Self-pay | Admitting: Hematology & Oncology

## 2020-01-17 ENCOUNTER — Other Ambulatory Visit: Payer: Self-pay

## 2020-01-17 ENCOUNTER — Telehealth: Payer: Self-pay | Admitting: Pharmacy Technician

## 2020-01-17 ENCOUNTER — Inpatient Hospital Stay (HOSPITAL_BASED_OUTPATIENT_CLINIC_OR_DEPARTMENT_OTHER): Payer: PPO | Admitting: Hematology & Oncology

## 2020-01-17 VITALS — BP 121/60 | HR 88 | Temp 96.9°F | Resp 17 | Wt 116.2 lb

## 2020-01-17 DIAGNOSIS — C9000 Multiple myeloma not having achieved remission: Secondary | ICD-10-CM | POA: Diagnosis not present

## 2020-01-17 DIAGNOSIS — H04123 Dry eye syndrome of bilateral lacrimal glands: Secondary | ICD-10-CM | POA: Diagnosis not present

## 2020-01-17 DIAGNOSIS — Z5112 Encounter for antineoplastic immunotherapy: Secondary | ICD-10-CM | POA: Diagnosis not present

## 2020-01-17 DIAGNOSIS — Z961 Presence of intraocular lens: Secondary | ICD-10-CM | POA: Diagnosis not present

## 2020-01-17 DIAGNOSIS — Z9889 Other specified postprocedural states: Secondary | ICD-10-CM | POA: Diagnosis not present

## 2020-01-17 MED ORDER — IXAZOMIB CITRATE 3 MG PO CAPS: ORAL_CAPSULE | ORAL | 4 refills | Status: DC

## 2020-01-17 MED ORDER — TEMAZEPAM 7.5 MG PO CAPS: 7.5000 mg | ORAL_CAPSULE | Freq: Every evening | ORAL | 0 refills | Status: DC | PRN

## 2020-01-17 NOTE — Progress Notes (Signed)
Hematology and Oncology Follow Up Visit  Debra Ruiz [id number] July 08, 1946 75 y.o. 01/17/2020   Principle Diagnosis:  IgG Kappa myeloma - compression fractures - 13q-, +11, 14q+ - hyperdiploid -- progressive Iron def anemia  Completed Therapy: S/p Kyphoplasty of T11 and T12 XRT to lower spine-completed on 09/25/2016  Current Therapy:   Ninlaro 3 mg po q week (3 on/1 off) -- start on 01/24/2020 Velcade/Pomalyst/Decadron -Start on 09/02/2018 --Pomalyst on hold since 11/09/2018.  D/C on 01/17/2020 Xgeva 181m Sq q  3 month - next dose in July 2021  IV Injectafer -- last dose given on 11/2018   Interim History:  Debra Ruiz here today for follow-up.  Unfortunately, her myeloma is now progressing.   Her M spike went from 0.3 g/dL up to 0.6 g/dL.  The s IgG level went from 552 mg/dL up to 900 mg/dL.  I think that her options now are to increase the frequency of her Velcade.  She really does not want to do that.  The other option would be [facility]  I think that she would respond to Ninlaro.  She has always responded to Velcade.  I think if we can do the Ninlaro, and do a weekly for 3 weeks on and 1 week off, then we would certainly be in good shape.  She feels good otherwise.  She does have neck problems.  As I like she may need to have surgery for her neck again.  She is sleeping well.  She is now on Restoril which is helping.  She has had no problems with fever.  There is no cough or shortness of breath.  She has had no nausea or vomiting.  She has had no change in bowel or bladder habits.  There is been no leg swelling.  She has had no rashes.  She and her husband, who was able to come in with her today, are both enjoying their new cat.  Overall, her performance status is ECOG one.      Medications:  Allergies as of 01/17/2020      Reactions   Crestor [rosuvastatin Calcium]    Causes liver functions to be elevated      Medication List        Accurate as of January 17, 2020 12:23 PM. If you have any questions, ask your nurse or doctor.        ALPRAZolam 0.25 MG tablet Commonly known as: XANAX Take 1 tablet (0.25 mg total) by mouth 2 (two) times daily as needed. for anxiety   amLODipine 5 MG tablet Commonly known as: NORVASC TAKE 1 TABLET(5 MG) BY MOUTH DAILY   aspirin 81 MG EC tablet Take 81 mg by mouth daily.   atorvastatin 20 MG tablet Commonly known as: LIPITOR Take 1 tablet (20 mg total) by mouth daily. TAKE 1 TABLET(20 MG) BY MOUTH DAILY   buPROPion 300 MG 24 hr tablet Commonly known as: Wellbutrin XL Take 1 tablet (300 mg total) by mouth daily.   dexamethasone 4 MG tablet Commonly known as: DECADRON TAKE 3 TABLETS BY MOUTH 1 TIME A WEEK   escitalopram 20 MG tablet Commonly known as: LEXAPRO TAKE 1 TABLET(20 MG) BY MOUTH DAILY   famciclovir 250 MG tablet Commonly known as: FAMVIR TAKE 1 TABLET(250 MG) BY MOUTH DAILY   gabapentin 300 MG capsule Commonly known as: NEURONTIN Take 300 mg by mouth at bedtime.   IRON PO Take by mouth.   nebivolol 5 MG  tablet Commonly known as: Bystolic Take 1 tablet (5 mg total) by mouth daily.   potassium chloride SA 20 MEQ tablet Commonly known as: KLOR-CON TAKE 1 TABLET(20 MEQ) BY MOUTH DAILY   prochlorperazine 10 MG tablet Commonly known as: COMPAZINE TAKE 1 TABLET(10 MG) BY MOUTH EVERY 6 HOURS AS NEEDED FOR NAUSEA OR VOMITING What changed: See the new instructions.   temazepam 7.5 MG capsule Commonly known as: RESTORIL Take 1 capsule (7.5 mg total) by mouth at bedtime as needed for sleep.   Vitamin B-12 5000 MCG Tbdp Take by mouth.   Vitamin D 50 MCG (2000 UT) Caps Take 2,000 Units by mouth daily.       Allergies:  Allergies  Allergen Reactions  . Crestor [Rosuvastatin Calcium]     Causes liver functions to be elevated    Past Medical History, Surgical history, Social history, and Family History were reviewed and updated.  Review of  Systems: Review of Systems  Constitutional: Negative.   HENT: Negative.   Eyes: Negative.   Respiratory: Negative.   Cardiovascular: Negative.   Gastrointestinal: Negative.   Genitourinary: Negative.   Musculoskeletal: Positive for back pain.  Skin: Negative.   Neurological: Negative.   Endo/Heme/Allergies: Negative.   Psychiatric/Behavioral: Negative.     Physical Exam:  weight is 116 lb 4 oz (52.7 kg). Her temporal temperature is 96.9 F (36.1 C) (abnormal). Her blood pressure is 121/60 and her pulse is 88. Her respiration is 17 and oxygen saturation is 100%.   Wt Readings from Last 3 Encounters:  01/17/20 116 lb 4 oz (52.7 kg)  12/29/19 116 lb (52.6 kg)  11/17/19 117 lb (53.1 kg)    Physical Exam Vitals reviewed.  HENT:     Head: Normocephalic and atraumatic.  Eyes:     Pupils: Pupils are equal, round, and reactive to light.  Cardiovascular:     Rate and Rhythm: Normal rate and regular rhythm.     Heart sounds: Normal heart sounds.  Pulmonary:     Effort: Pulmonary effort is normal.     Breath sounds: Normal breath sounds.  Abdominal:     General: Bowel sounds are normal.     Palpations: Abdomen is soft.  Musculoskeletal:        General: No tenderness or deformity. Normal range of motion.     Cervical back: Normal range of motion.  Lymphadenopathy:     Cervical: No cervical adenopathy.  Skin:    General: Skin is warm and dry.     Findings: No erythema or rash.  Neurological:     Mental Status: She is alert and oriented to person, place, and time.  Psychiatric:        Behavior: Behavior normal.        Thought Content: Thought content normal.        Judgment: Judgment normal.       Lab Results  Component Value Date   WBC 7.3 12/29/2019   HGB 13.8 12/29/2019   HCT 41.2 12/29/2019   MCV 100.7 (H) 12/29/2019   PLT 225 12/29/2019   Lab Results  Component Value Date   FERRITIN 128 10/06/2019   IRON 168 (H) 10/06/2019   TIBC 326 10/06/2019   UIBC 159  10/06/2019   IRONPCTSAT 51 10/06/2019   Lab Results  Component Value Date   RBC 4.09 12/29/2019   Lab Results  Component Value Date   KPAFRELGTCHN 72.7 (H) 12/29/2019   LAMBDASER 4.1 (L) 12/29/2019   KAPLAMBRATIO 17.73 (H)  12/29/2019   Lab Results  Component Value Date   IGGSERUM 904 12/29/2019   IGGSERUM 901 12/29/2019   IGA 20 (L) 12/29/2019   IGA 21 (L) 12/29/2019   IGMSERUM 32 12/29/2019   IGMSERUM 33 12/29/2019   Lab Results  Component Value Date   TOTALPROTELP 6.5 12/29/2019   ALBUMINELP 3.9 12/29/2019   A1GS 0.2 12/29/2019   A2GS 0.7 12/29/2019   BETS 0.9 12/29/2019   GAMS 0.8 12/29/2019   MSPIKE 0.6 (H) 12/29/2019   SPEI Comment 01/28/2018     Chemistry      Component Value Date/Time   NA 141 12/29/2019 1343   NA 148 (H) 07/16/2017 1128   NA 143 03/05/2017 1051   K 3.9 12/29/2019 1343   K 3.8 07/16/2017 1128   K 3.7 03/05/2017 1051   CL 106 12/29/2019 1343   CL 106 07/16/2017 1128   CO2 27 12/29/2019 1343   CO2 29 07/16/2017 1128   CO2 25 03/05/2017 1051   BUN 17 12/29/2019 1343   BUN 23 (H) 07/16/2017 1128   BUN 18.2 03/05/2017 1051   CREATININE 0.76 12/29/2019 1343   CREATININE 0.72 06/14/2019 0947   CREATININE 0.8 03/05/2017 1051      Component Value Date/Time   CALCIUM 9.4 12/29/2019 1343   CALCIUM 9.7 07/16/2017 1128   CALCIUM 8.9 03/05/2017 1051   ALKPHOS 54 12/29/2019 1343   ALKPHOS 71 07/16/2017 1128   ALKPHOS 67 03/05/2017 1051   AST 15 12/29/2019 1343   AST 15 03/05/2017 1051   ALT 13 12/29/2019 1343   ALT 23 07/16/2017 1128   ALT 17 03/05/2017 1051   BILITOT 0.5 12/29/2019 1343   BILITOT 0.55 03/05/2017 1051         Impression and Plan: Ms. Kochanowski is a very pleasant 74 yo caucasian female with IgG Kappa Myeloma.    For right now, we will see how she does with the Ninlaro.  Again she will be on 3 mg weekly for 3 weeks on and 1 week off.  She still is taking the dexamethasone at 12 mg weekly.  We will send the [facility] to  our [facility] outpatient pharmacy.  I am sure that Alyson we do a great job in getting this all taken care of.  Hopefully will not cause much.  We will likely plan to get her back to see [place] in another four or 5 weeks.  I spent about 30 minutes with she and her husband today.     [name], MD 6/28/202112:23 PM

## 2020-01-17 NOTE — Telephone Encounter (Signed)
Oral Oncology Patient Advocate Encounter  Received notification from [employer] that prior authorization for Kennieth Rad is required.  PA submitted on CoverMyMeds Key [id number] Status is pending  Oral Oncology Clinic will continue to follow.  [place] Patient [place] Phone [phone] Fax [phone] 01/19/2020 10:58 AM

## 2020-01-19 ENCOUNTER — Telehealth: Payer: Self-pay | Admitting: Pharmacist

## 2020-01-19 NOTE — Telephone Encounter (Signed)
Oral Oncology Patient Advocate Encounter  Received notification from [employer] that the request for prior authorization for Kennieth Rad has been denied due to not being used in combination with Revlimid.     This determination is currently being appealed.  The appeal packet was faxed to [phone] on 01/28/20.   This encounter will continue to be updated until final determination.    [place] Patient [place] Phone [phone] Fax [phone] 01/31/2020 8:30 AM

## 2020-01-19 NOTE — Telephone Encounter (Signed)
Oral Oncology Pharmacist Encounter  Received new prescription for Ninlaro (ixazomib) for the treatment of multiple myeloma in conjunction with dexamethasone, planned duration until disease progression or unacceptable drug toxicity.  CMP from 12/29/19 assessed, no relevant lab abnormalities. Prescription dose and frequency assessed.   Current medication list in Epic reviewed, no DDIs with ixazomib identified.  Prescription has been e-scribed to the [facility] for benefits analysis and approval.  Oral Oncology Clinic will continue to follow for insurance authorization, copayment issues, initial counseling and start date.  Darl Pikes, PharmD, BCPS, BCOP, CPP Hematology/Oncology Clinical Pharmacist Practitioner [facility] Oral [place] [facility] [place] [phone]  01/19/2020 11:59 AM

## 2020-01-20 ENCOUNTER — Telehealth: Payer: Self-pay | Admitting: *Deleted

## 2020-01-20 ENCOUNTER — Ambulatory Visit: Payer: PPO | Admitting: Hematology & Oncology

## 2020-01-20 NOTE — Telephone Encounter (Signed)
Call received from patient upset that she got a call from her insurance company yesterday stating that her Debra Ruiz was denied.  Informed pt that our pharmacy filed an appeal yesterday regarding Ninlaro denial.  Pt appreciative of information and assistance and has no further concerns at this time.

## 2020-01-28 NOTE — Telephone Encounter (Signed)
Oral Chemotherapy Pharmacist Encounter   [place] Appeal fax to [place] on 01/28/20. Attmepted to call patient to give her a status update, no answer LVM for patient to call me back.  Darl Pikes, PharmD, BCPS, BCOP, CPP Hematology/Oncology Clinical Pharmacist [facility] Oral [place] [facility] [phone]  01/28/2020 11:15 AM

## 2020-01-31 ENCOUNTER — Telehealth: Payer: Self-pay | Admitting: Pharmacy Technician

## 2020-01-31 MED FILL — NINLARO 3 MG CAPS: 3 | 28 days supply | Qty: 3 | Fill #0

## 2020-01-31 NOTE — Telephone Encounter (Signed)
Oral Oncology Patient Advocate Encounter  Patient called [employer] [place] to re-apply for asssitance.  She is approved for an $11,000 grant to provide copayment coverage for Ninlaro.  This will keep the out of pocket expense at $0.     [employer] ID: [id number]   The billing information is as follows and has been shared with [place].    RxBin: [id number] PCN: [id number] Member ID: [id number] Group ID: [id number] Dates of Eligibility: 01/01/20 through 12/30/20  Fund:  [place] Patient [place] Phone [phone] Fax [phone] 01/31/2020 11:44 AM

## 2020-01-31 NOTE — Telephone Encounter (Signed)
Oral Oncology Patient Advocate Encounter  I spoke with Debra Ruiz this afternoon to set up delivery of Ninlaro.  Address verified for shipment.  Debra Ruiz will be filled through [facility] and shipped by [employer] on 01/31/20 for delivery 02/01/20.    [place] will call 7-10 days before next refill is due to complete adherence call and set up delivery of medication.     Debra Ruiz Phone [phone] Fax [phone] 01/31/2020 2:52 PM

## 2020-01-31 NOTE — Telephone Encounter (Addendum)
Oral Oncology Patient Advocate Encounter  Prior Authorization for Debra Ruiz has been approved.    PA# [id number] Effective dates: 01/28/20 through 01/26/21  Patients co-pay is $2323. Have emailed links to [employer] for patient to apply since we could not verify current income.   Oral Oncology Clinic will continue to follow.   [name] Phone [phone] Fax [phone] 01/31/2020 8:41 AM

## 2020-01-31 NOTE — Telephone Encounter (Signed)
Oral Chemotherapy Pharmacist Encounter  [place] will delver the [facility] on 02/01/20. Debra Ruiz knows to get started when she receives the medication.  Patient Education I spoke with patient for overview of new oral chemotherapy medication: Ninlaro (ixazomib) for the treatment of multiple myeloma in conjunction with dexamethasone, planned duration until disease progression or unacceptable drug toxicity.   Counseled patient on administration, dosing, side effects, monitoring, drug-food interactions, safe handling, storage, and disposal. Patient will take 1 capsule (3 mg) by mouth weekly, 3 weeks on, 1 week off, repeat every 4 weeks. Take on an empty stomach 1hr before or 2hr after meals.  Side effects include but not limited to: diarrhea/constipation, decreased wbc/plt, edema, neuropathy.    Reviewed with patient importance of keeping a medication schedule and plan for any missed doses.  Debra Ruiz voiced understanding and appreciation. All questions answered. Medication handout placed in the mail.  Provided patient with Oral [place] [facility] phone number. Patient knows to call the office with questions or concerns. Oral Chemotherapy [facility] will continue to follow.  Darl Pikes, PharmD, BCPS, BCOP, CPP Hematology/Oncology Clinical Pharmacist Practitioner [facility] [place] [facility] [phone]  01/31/2020 3:15 PM

## 2020-02-09 ENCOUNTER — Inpatient Hospital Stay: Payer: PPO

## 2020-02-09 ENCOUNTER — Other Ambulatory Visit: Payer: PPO

## 2020-02-09 ENCOUNTER — Ambulatory Visit: Payer: PPO | Admitting: Hematology & Oncology

## 2020-02-16 NOTE — Progress Notes (Signed)
Patient is now on Ninlaro. Velcade careplan discontinued per Dr. Antonieta Pert instructions.

## 2020-02-23 ENCOUNTER — Inpatient Hospital Stay (HOSPITAL_BASED_OUTPATIENT_CLINIC_OR_DEPARTMENT_OTHER): Payer: PPO | Admitting: Hematology & Oncology

## 2020-02-23 ENCOUNTER — Encounter: Payer: Self-pay | Admitting: Hematology & Oncology

## 2020-02-23 ENCOUNTER — Inpatient Hospital Stay: Payer: PPO

## 2020-02-23 ENCOUNTER — Other Ambulatory Visit: Payer: Self-pay

## 2020-02-23 ENCOUNTER — Inpatient Hospital Stay: Payer: PPO | Attending: Hematology & Oncology

## 2020-02-23 VITALS — BP 125/60 | HR 87 | Temp 98.0°F | Resp 18 | Wt 115.5 lb

## 2020-02-23 DIAGNOSIS — C9000 Multiple myeloma not having achieved remission: Secondary | ICD-10-CM

## 2020-02-23 LAB — CBC WITH DIFFERENTIAL (CANCER CENTER ONLY)
Abs Immature Granulocytes: 0.43 10*3/uL — ABNORMAL HIGH (ref 0.00–0.07)
Basophils Absolute: 0.1 10*3/uL (ref 0.0–0.1)
Basophils Relative: 1 %
Eosinophils Absolute: 0.8 10*3/uL — ABNORMAL HIGH (ref 0.0–0.5)
Eosinophils Relative: 9 %
HCT: 38.8 % (ref 36.0–46.0)
Hemoglobin: 12.9 g/dL (ref 12.0–15.0)
Immature Granulocytes: 5 %
Lymphocytes Relative: 9 %
Lymphs Abs: 0.9 10*3/uL (ref 0.7–4.0)
MCH: 32.8 pg (ref 26.0–34.0)
MCHC: 33.2 g/dL (ref 30.0–36.0)
MCV: 98.7 fL (ref 80.0–100.0)
Monocytes Absolute: 1 10*3/uL (ref 0.1–1.0)
Monocytes Relative: 11 %
Neutro Abs: 6.4 10*3/uL (ref 1.7–7.7)
Neutrophils Relative %: 65 %
Platelet Count: 233 10*3/uL (ref 150–400)
RBC: 3.93 MIL/uL (ref 3.87–5.11)
RDW: 12.3 % (ref 11.5–15.5)
WBC Count: 9.6 10*3/uL (ref 4.0–10.5)
nRBC: 0 % (ref 0.0–0.2)

## 2020-02-23 LAB — CMP (CANCER CENTER ONLY)
ALT: 18 U/L (ref 0–44)
AST: 18 U/L (ref 15–41)
Albumin: 4.1 g/dL (ref 3.5–5.0)
Alkaline Phosphatase: 63 U/L (ref 38–126)
Anion gap: 7 (ref 5–15)
BUN: 18 mg/dL (ref 8–23)
CO2: 29 mmol/L (ref 22–32)
Calcium: 9.4 mg/dL (ref 8.9–10.3)
Chloride: 103 mmol/L (ref 98–111)
Creatinine: 0.75 mg/dL (ref 0.44–1.00)
GFR, Est AFR Am: >60 mL/min (ref >60)
GFR, Estimated: >60 mL/min (ref >60)
Glucose, Bld: 134 mg/dL — ABNORMAL HIGH (ref 70–99)
Potassium: 4.2 mmol/L (ref 3.5–5.1)
Sodium: 139 mmol/L (ref 135–145)
Total Bilirubin: 0.5 mg/dL (ref 0.3–1.2)
Total Protein: 6.6 g/dL (ref 6.5–8.1)

## 2020-02-23 MED ORDER — IXAZOMIB CITRATE 2.3 MG PO CAPS: ORAL_CAPSULE | ORAL | 4 refills | Status: DC

## 2020-02-23 MED ORDER — DENOSUMAB 120 MG/1.7ML ~~LOC~~ SOLN
120.0000 mg | Freq: Once | SUBCUTANEOUS | Status: AC
  Administered 2020-02-23: 120 mg via SUBCUTANEOUS

## 2020-02-23 NOTE — Patient Instructions (Signed)
Denosumab injection Delton See) What is this medicine? DENOSUMAB (den oh sue mab) slows bone breakdown. Prolia is used to treat osteoporosis in women after menopause and in men, and in people who are taking corticosteroids for 6 months or more. Delton See is used to treat a high calcium level due to cancer and to prevent bone fractures and other bone problems caused by multiple myeloma or cancer bone metastases. Delton See is also used to treat giant cell tumor of the bone. This medicine may be used for other purposes; ask your health care provider or pharmacist if you have questions. COMMON BRAND NAME(S): Prolia, XGEVA What should I tell my health care provider before I take this medicine? They need to know if you have any of these conditions:  dental disease  having surgery or tooth extraction  infection  kidney disease  low levels of calcium or Vitamin D in the blood  malnutrition  on hemodialysis  skin conditions or sensitivity  thyroid or parathyroid disease  an unusual reaction to denosumab, other medicines, foods, dyes, or preservatives  pregnant or trying to get pregnant  breast-feeding How should I use this medicine? This medicine is for injection under the skin. It is given by a health care professional in a hospital or clinic setting. A special MedGuide will be given to you before each treatment. Be sure to read this information carefully each time. For Prolia, talk to your pediatrician regarding the use of this medicine in children. Special care may be needed. For Delton See, talk to your pediatrician regarding the use of this medicine in children. While this drug may be prescribed for children as young as 13 years for selected conditions, precautions do apply. Overdosage: If you think you have taken too much of this medicine contact a poison control center or emergency room at once. NOTE: This medicine is only for you. Do not share this medicine with others. What if I miss a dose? It  is important not to miss your dose. Call your doctor or health care professional if you are unable to keep an appointment. What may interact with this medicine? Do not take this medicine with any of the following medications:  other medicines containing denosumab This medicine may also interact with the following medications:  medicines that lower your chance of fighting infection  steroid medicines like prednisone or cortisone This list may not describe all possible interactions. Give your health care provider a list of all the medicines, herbs, non-prescription drugs, or dietary supplements you use. Also tell them if you smoke, drink alcohol, or use illegal drugs. Some items may interact with your medicine. What should I watch for while using this medicine? Visit your doctor or health care professional for regular checks on your progress. Your doctor or health care professional may order blood tests and other tests to see how you are doing. Call your doctor or health care professional for advice if you get a fever, chills or sore throat, or other symptoms of a cold or flu. Do not treat yourself. This drug may decrease your body's ability to fight infection. Try to avoid being around people who are sick. You should make sure you get enough calcium and vitamin D while you are taking this medicine, unless your doctor tells you not to. Discuss the foods you eat and the vitamins you take with your health care professional. See your dentist regularly. Brush and floss your teeth as directed. Before you have any dental work done, tell your dentist you  are receiving this medicine. Do not become pregnant while taking this medicine or for 5 months after stopping it. Talk with your doctor or health care professional about your birth control options while taking this medicine. Women should inform their doctor if they wish to become pregnant or think they might be pregnant. There is a potential for serious side  effects to an unborn child. Talk to your health care professional or pharmacist for more information. What side effects may I notice from receiving this medicine? Side effects that you should report to your doctor or health care professional as soon as possible:  allergic reactions like skin rash, itching or hives, swelling of the face, lips, or tongue  bone pain  breathing problems  dizziness  jaw pain, especially after dental work  redness, blistering, peeling of the skin  signs and symptoms of infection like fever or chills; cough; sore throat; pain or trouble passing urine  signs of low calcium like fast heartbeat, muscle cramps or muscle pain; pain, tingling, numbness in the hands or feet; seizures  unusual bleeding or bruising  unusually weak or tired Side effects that usually do not require medical attention (report to your doctor or health care professional if they continue or are bothersome):  constipation  diarrhea  headache  joint pain  loss of appetite  muscle pain  runny nose  tiredness  upset stomach This list may not describe all possible side effects. Call your doctor for medical advice about side effects. You may report side effects to FDA at 1-800-FDA-1088. Where should I keep my medicine? This medicine is only given in a clinic, doctor's office, or other health care setting and will not be stored at home. NOTE: This sheet is a summary. It may not cover all possible information. If you have questions about this medicine, talk to your doctor, pharmacist, or health care provider.  2020 Elsevier/Gold Standard (2017-11-14 16:10:44)

## 2020-02-23 NOTE — Progress Notes (Signed)
Hematology and Oncology Follow Up Visit  Debra Ruiz [id number] [birth year] 74 y.o. 02/23/2020   Principle Diagnosis:  IgG Kappa myeloma - compression fractures - 13q-, +11, 14q+ - hyperdiploid -- progressive Iron def anemia  Completed Therapy: S/p Kyphoplasty of T11 and T12 XRT to lower spine-completed on 09/25/2016  Current Therapy:   Ninlaro 2.3 mg po q week (3 on/1 off) -- start on 01/24/2020 Velcade/Pomalyst/Decadron -Start on 09/02/2018 --Pomalyst on hold since 11/09/2018.  D/C on 01/17/2020 Xgeva 151m Sq q  3 month - next dose in November 2021  IV Injectafer -- last dose given on 11/2018   Interim History:  Ms. JMorfordis here today for follow-up.  She started her Ninlaro in July.  She did well with the first 2 doses but then with a third dose, she had a lot of problems.  She had a lot of diarrhea.  She had a lot of nausea.  She felt sick.  She just did not want to eat.  She thinks that the 3 mg dose might be too much for her.  I suppose this could be possible.  As such, we will decrease her dose to 2.3 mg weekly.  She and her [place].  I told her that we do not have to start the next cycle of Ninlaro until after she gets back.  She has had no problems with rashes.  She does have itching.  There is no rash with the itching.  She has had no fever.  There has been no cough.  She seems to be sleeping pretty well.  The Restoril does seem to help her.  Her neck still bothers her.  She has had surgery on her neck.  She may need surgery on her lower back.  Overall, her performance status is ECOG 1-2.      Medications:  Allergies as of 02/23/2020      Reactions   Crestor [rosuvastatin Calcium]    Causes liver functions to be elevated      Medication List       Accurate as of February 23, 2020 12:12 PM. If you have any questions, ask your nurse or doctor.        ALPRAZolam 0.25 MG tablet Commonly known as: XANAX Take 1  tablet (0.25 mg total) by mouth 2 (two) times daily as needed. for anxiety   amLODipine 5 MG tablet Commonly known as: NORVASC TAKE 1 TABLET(5 MG) BY MOUTH DAILY   aspirin 81 MG EC tablet Take 81 mg by mouth daily.   atorvastatin 20 MG tablet Commonly known as: LIPITOR Take 1 tablet (20 mg total) by mouth daily. TAKE 1 TABLET(20 MG) BY MOUTH DAILY   buPROPion 300 MG 24 hr tablet Commonly known as: Wellbutrin XL Take 1 tablet (300 mg total) by mouth daily.   dexamethasone 4 MG tablet Commonly known as: DECADRON TAKE 3 TABLETS BY MOUTH 1 TIME A WEEK   escitalopram 20 MG tablet Commonly known as: LEXAPRO TAKE 1 TABLET(20 MG) BY MOUTH DAILY   famciclovir 250 MG tablet Commonly known as: FAMVIR TAKE 1 TABLET(250 MG) BY MOUTH DAILY   gabapentin 300 MG capsule Commonly known as: NEURONTIN Take 300 mg by mouth at bedtime.   IRON PO Take by mouth.   ixazomib citrate 3 MG capsule Commonly known as: Ninlaro Take 1 capsule (3 mg) by mouth weekly, 3 weeks on, 1 week off, repeat every 4 weeks. Take on an  empty stomach 1hr before or 2hr after meals.   nebivolol 5 MG tablet Commonly known as: Bystolic Take 1 tablet (5 mg total) by mouth daily.   potassium chloride SA 20 MEQ tablet Commonly known as: KLOR-CON TAKE 1 TABLET(20 MEQ) BY MOUTH DAILY   prochlorperazine 10 MG tablet Commonly known as: COMPAZINE TAKE 1 TABLET(10 MG) BY MOUTH EVERY 6 HOURS AS NEEDED FOR NAUSEA OR VOMITING   temazepam 7.5 MG capsule Commonly known as: RESTORIL Take 1 capsule (7.5 mg total) by mouth at bedtime as needed for sleep.   Vitamin B-12 5000 MCG Tbdp Take by mouth.   Vitamin D 50 MCG (2000 UT) Caps Take 2,000 Units by mouth daily.       Allergies:  Allergies  Allergen Reactions  . Crestor [Rosuvastatin Calcium]     Causes liver functions to be elevated    Past Medical History, Surgical history, Social history, and Family History were reviewed and updated.  Review of  Systems: Review of Systems  Constitutional: Negative.   HENT: Negative.   Eyes: Negative.   Respiratory: Negative.   Cardiovascular: Negative.   Gastrointestinal: Negative.   Genitourinary: Negative.   Musculoskeletal: Positive for back pain.  Skin: Negative.   Neurological: Negative.   Endo/Heme/Allergies: Negative.   Psychiatric/Behavioral: Negative.     Physical Exam:  weight is 115 lb 8 oz (52.4 kg). Her oral temperature is 98 F (36.7 C). Her blood pressure is 125/60 and her pulse is 87. Her respiration is 18 and oxygen saturation is 99%.   Wt Readings from Last 3 Encounters:  02/23/20 115 lb 8 oz (52.4 kg)  01/17/20 116 lb 4 oz (52.7 kg)  12/29/19 116 lb (52.6 kg)    Physical Exam Vitals reviewed.  HENT:     Head: Normocephalic and atraumatic.  Eyes:     Pupils: Pupils are equal, round, and reactive to light.  Cardiovascular:     Rate and Rhythm: Normal rate and regular rhythm.     Heart sounds: Normal heart sounds.  Pulmonary:     Effort: Pulmonary effort is normal.     Breath sounds: Normal breath sounds.  Abdominal:     General: Bowel sounds are normal.     Palpations: Abdomen is soft.  Musculoskeletal:        General: No tenderness or deformity. Normal range of motion.     Cervical back: Normal range of motion.  Lymphadenopathy:     Cervical: No cervical adenopathy.  Skin:    General: Skin is warm and dry.     Findings: No erythema or rash.  Neurological:     Mental Status: She is alert and oriented to person, place, and time.  Psychiatric:        Behavior: Behavior normal.        Thought Content: Thought content normal.        Judgment: Judgment normal.       Lab Results  Component Value Date   WBC 9.6 02/23/2020   HGB 12.9 02/23/2020   HCT 38.8 02/23/2020   MCV 98.7 02/23/2020   PLT 233 02/23/2020   Lab Results  Component Value Date   FERRITIN 128 10/06/2019   IRON 168 (H) 10/06/2019   TIBC 326 10/06/2019   UIBC 159 10/06/2019    IRONPCTSAT 51 10/06/2019   Lab Results  Component Value Date   RBC 3.93 02/23/2020   Lab Results  Component Value Date   KPAFRELGTCHN 72.7 (H) 12/29/2019   LAMBDASER 4.1 (  L) 12/29/2019   KAPLAMBRATIO 17.73 (H) 12/29/2019   Lab Results  Component Value Date   IGGSERUM 904 12/29/2019   IGGSERUM 901 12/29/2019   IGA 20 (L) 12/29/2019   IGA 21 (L) 12/29/2019   IGMSERUM 32 12/29/2019   IGMSERUM 33 12/29/2019   Lab Results  Component Value Date   TOTALPROTELP 6.5 12/29/2019   ALBUMINELP 3.9 12/29/2019   A1GS 0.2 12/29/2019   A2GS 0.7 12/29/2019   BETS 0.9 12/29/2019   GAMS 0.8 12/29/2019   MSPIKE 0.6 (H) 12/29/2019   SPEI Comment 01/28/2018     Chemistry      Component Value Date/Time   NA 139 02/23/2020 1125   NA 148 (H) 07/16/2017 1128   NA 143 03/05/2017 1051   K 4.2 02/23/2020 1125   K 3.8 07/16/2017 1128   K 3.7 03/05/2017 1051   CL 103 02/23/2020 1125   CL 106 07/16/2017 1128   CO2 29 02/23/2020 1125   CO2 29 07/16/2017 1128   CO2 25 03/05/2017 1051   BUN 18 02/23/2020 1125   BUN 23 (H) 07/16/2017 1128   BUN 18.2 03/05/2017 1051   CREATININE 0.75 02/23/2020 1125   CREATININE 0.72 06/14/2019 0947   CREATININE 0.8 03/05/2017 1051      Component Value Date/Time   CALCIUM 9.4 02/23/2020 1125   CALCIUM 9.7 07/16/2017 1128   CALCIUM 8.9 03/05/2017 1051   ALKPHOS 63 02/23/2020 1125   ALKPHOS 71 07/16/2017 1128   ALKPHOS 67 03/05/2017 1051   AST 18 02/23/2020 1125   AST 15 03/05/2017 1051   ALT 18 02/23/2020 1125   ALT 23 07/16/2017 1128   ALT 17 03/05/2017 1051   BILITOT 0.5 02/23/2020 1125   BILITOT 0.55 03/05/2017 1051         Impression and Plan: Ms. Muench is a very pleasant 74 yo caucasian female with IgG Kappa Myeloma.    We will decrease the dose of Ninlaro to 2.3 mg.  Hopefully she will do better with this.  I know she does not like having to come to the office all that much.  If, for some reason, she cannot tolerate the 2.3 mg dose of  Ninlaro, I may consider her for [place].  This would be a good idea since this is only once a month.  We will see what the monoclonal studies look like.  Since is only been 1 month with Ninlaro, we may not see that much of a response yet.  I will plan to get her back in about a month or so.  She got her Xgeva today.  For right now, we will see how she does with the Ninlaro.  Again she will be on 3 mg weekly for 3 weeks on and 1 week off.  She still is taking the dexamethasone at 12 mg weekly.  We will send the [facility] to our [facility] outpatient pharmacy.  I am sure that Alyson we do a great job in getting this all taken care of.  Hopefully will not cause much.  We will likely plan to get her back to see [place] in another four or 5 weeks.  I spent about 30 minutes with she and her husband today.     [name], MD 8/4/202112:12 PM

## 2020-02-24 LAB — KAPPA/LAMBDA LIGHT CHAINS
Kappa free light chain: 69.3 mg/L — ABNORMAL HIGH (ref 3.3–19.4)
Kappa, lambda light chain ratio: 21 — ABNORMAL HIGH (ref 0.26–1.65)
Lambda free light chains: 3.3 mg/L — ABNORMAL LOW (ref 5.7–26.3)

## 2020-02-24 LAB — IGG, IGA, IGM
IgA: 12 mg/dL — ABNORMAL LOW (ref 64–422)
IgG (Immunoglobin G), Serum: 931 mg/dL (ref 586–1602)
IgM (Immunoglobulin M), Srm: 37 mg/dL (ref 26–217)

## 2020-02-25 ENCOUNTER — Other Ambulatory Visit: Payer: Self-pay | Admitting: Hematology & Oncology

## 2020-02-25 LAB — PROTEIN ELECTROPHORESIS, SERUM, WITH REFLEX
A/G Ratio: 1.1 (ref 0.7–1.7)
Albumin ELP: 3.4 g/dL (ref 2.9–4.4)
Alpha-1-Globulin: 0.3 g/dL (ref 0.0–0.4)
Alpha-2-Globulin: 0.8 g/dL (ref 0.4–1.0)
Beta Globulin: 1 g/dL (ref 0.7–1.3)
Gamma Globulin: 0.8 g/dL (ref 0.4–1.8)
Globulin, Total: 3 g/dL (ref 2.2–3.9)
M-Spike, %: 0.7 g/dL — ABNORMAL HIGH
SPEP Interpretation: 0
Total Protein ELP: 6.4 g/dL (ref 6.0–8.5)

## 2020-02-25 LAB — IMMUNOFIXATION REFLEX, SERUM
IgA: 15 mg/dL — ABNORMAL LOW (ref 64–422)
IgG (Immunoglobin G), Serum: 981 mg/dL (ref 586–1602)
IgM (Immunoglobulin M), Srm: 34 mg/dL (ref 26–217)

## 2020-02-28 MED FILL — NINLARO 2.3 MG CAPS: 2.3 | 28 days supply | Qty: 3 | Fill #0

## 2020-03-13 ENCOUNTER — Other Ambulatory Visit: Payer: Self-pay

## 2020-03-13 MED ORDER — BUPROPION HCL ER (XL) 300 MG PO TB24: 300.0000 mg | ORAL_TABLET | Freq: Every day | ORAL | 3 refills | Status: DC

## 2020-03-21 ENCOUNTER — Telehealth: Payer: Self-pay | Admitting: Internal Medicine

## 2020-03-21 MED ORDER — AMLODIPINE BESYLATE 5 MG PO TABS: ORAL_TABLET | ORAL | 1 refills | Status: DC

## 2020-03-21 MED ORDER — POTASSIUM CHLORIDE CRYS ER 20 MEQ PO TBCR: EXTENDED_RELEASE_TABLET | ORAL | 1 refills | Status: DC

## 2020-03-21 NOTE — Telephone Encounter (Signed)
Has upcoming CPE. Refill x 6 months

## 2020-03-21 NOTE — Telephone Encounter (Addendum)
Received Fax RX request from  [facility], [place] Phone:  [phone]  Fax:  [phone]       Medication - amLODipine (NORVASC) 5 MG tablet potassium chloride SA (KLOR-CON) 20 MEQ tablet    Last Refill - Patient is requesting an advanced refill request  Last OV - 06/21/19  Last CPE - 06/21/19  Next Appointment - 06/23/19

## 2020-03-22 ENCOUNTER — Other Ambulatory Visit: Payer: PPO

## 2020-03-22 ENCOUNTER — Ambulatory Visit: Payer: PPO | Admitting: Hematology & Oncology

## 2020-03-22 ENCOUNTER — Inpatient Hospital Stay: Payer: PPO

## 2020-03-23 MED FILL — NINLARO 2.3 MG CAPS: 2.3 | 28 days supply | Qty: 3 | Fill #1

## 2020-03-24 ENCOUNTER — Other Ambulatory Visit: Payer: Self-pay | Admitting: *Deleted

## 2020-03-24 ENCOUNTER — Telehealth: Payer: Self-pay | Admitting: *Deleted

## 2020-03-24 DIAGNOSIS — C9000 Multiple myeloma not having achieved remission: Secondary | ICD-10-CM

## 2020-03-24 MED ORDER — METHYLPREDNISOLONE 4 MG PO TBPK: ORAL_TABLET | ORAL | 0 refills | Status: DC

## 2020-03-24 NOTE — Telephone Encounter (Signed)
Returned patient's call regarding itching and generalized rash due to [employer]. Patient stated,"I've been itching ever since I started this medicine. It's everywhere except my legs. I've been taking Benadryl 25 mg capsules, two capsules every six hours. I'm still itching and scratching with taking the Benadryl. Per Dr. Marin Olp, he is going to call in a Medrol Dosepak to your pharmacy. Please keep your appointment next week with Dr. Marin Olp. Also, stop taking the Ninlaro til you see Dr. Marin Olp next week. Patient stated,"this is my week off." She verbalized understanding of conversation.

## 2020-03-29 ENCOUNTER — Other Ambulatory Visit: Payer: Self-pay

## 2020-03-29 ENCOUNTER — Inpatient Hospital Stay: Payer: PPO | Attending: Hematology & Oncology

## 2020-03-29 ENCOUNTER — Inpatient Hospital Stay (HOSPITAL_BASED_OUTPATIENT_CLINIC_OR_DEPARTMENT_OTHER): Payer: PPO | Admitting: Hematology & Oncology

## 2020-03-29 ENCOUNTER — Encounter: Payer: Self-pay | Admitting: Hematology & Oncology

## 2020-03-29 ENCOUNTER — Telehealth: Payer: Self-pay | Admitting: Hematology & Oncology

## 2020-03-29 ENCOUNTER — Other Ambulatory Visit: Payer: Self-pay | Admitting: Hematology & Oncology

## 2020-03-29 VITALS — BP 126/60 | HR 86 | Temp 97.4°F | Resp 18 | Wt 111.8 lb

## 2020-03-29 DIAGNOSIS — D509 Iron deficiency anemia, unspecified: Secondary | ICD-10-CM | POA: Diagnosis not present

## 2020-03-29 DIAGNOSIS — Z5112 Encounter for antineoplastic immunotherapy: Secondary | ICD-10-CM | POA: Diagnosis not present

## 2020-03-29 DIAGNOSIS — Z79899 Other long term (current) drug therapy: Secondary | ICD-10-CM | POA: Diagnosis not present

## 2020-03-29 DIAGNOSIS — C9000 Multiple myeloma not having achieved remission: Secondary | ICD-10-CM

## 2020-03-29 LAB — CMP (CANCER CENTER ONLY)
ALT: 27 U/L (ref 0–44)
AST: 23 U/L (ref 15–41)
Albumin: 4.2 g/dL (ref 3.5–5.0)
Alkaline Phosphatase: 62 U/L (ref 38–126)
Anion gap: 10 (ref 5–15)
BUN: 17 mg/dL (ref 8–23)
CO2: 29 mmol/L (ref 22–32)
Calcium: 10 mg/dL (ref 8.9–10.3)
Chloride: 100 mmol/L (ref 98–111)
Creatinine: 0.78 mg/dL (ref 0.44–1.00)
GFR, Est AFR Am: >60 mL/min (ref >60)
GFR, Estimated: >60 mL/min (ref >60)
Glucose, Bld: 124 mg/dL — ABNORMAL HIGH (ref 70–99)
Potassium: 4.3 mmol/L (ref 3.5–5.1)
Sodium: 139 mmol/L (ref 135–145)
Total Bilirubin: 0.6 mg/dL (ref 0.3–1.2)
Total Protein: 7.1 g/dL (ref 6.5–8.1)

## 2020-03-29 LAB — CBC WITH DIFFERENTIAL (CANCER CENTER ONLY)
Abs Immature Granulocytes: 0.58 10*3/uL — ABNORMAL HIGH (ref 0.00–0.07)
Basophils Absolute: 0 10*3/uL (ref 0.0–0.1)
Basophils Relative: 0 %
Eosinophils Absolute: 0.1 10*3/uL (ref 0.0–0.5)
Eosinophils Relative: 1 %
HCT: 42.1 % (ref 36.0–46.0)
Hemoglobin: 14 g/dL (ref 12.0–15.0)
Immature Granulocytes: 5 %
Lymphocytes Relative: 6 %
Lymphs Abs: 0.7 10*3/uL (ref 0.7–4.0)
MCH: 32.3 pg (ref 26.0–34.0)
MCHC: 33.3 g/dL (ref 30.0–36.0)
MCV: 97.2 fL (ref 80.0–100.0)
Monocytes Absolute: 0.9 10*3/uL (ref 0.1–1.0)
Monocytes Relative: 8 %
Neutro Abs: 9 10*3/uL — ABNORMAL HIGH (ref 1.7–7.7)
Neutrophils Relative %: 80 %
Platelet Count: 240 10*3/uL (ref 150–400)
RBC: 4.33 MIL/uL (ref 3.87–5.11)
RDW: 12.8 % (ref 11.5–15.5)
WBC Count: 11.3 10*3/uL — ABNORMAL HIGH (ref 4.0–10.5)
nRBC: 0 % (ref 0.0–0.2)

## 2020-03-29 MED ORDER — MONTELUKAST SODIUM 10 MG PO TABS: 10.0000 mg | ORAL_TABLET | Freq: Every day | ORAL | 0 refills | Status: DC

## 2020-03-29 NOTE — Telephone Encounter (Signed)
Appointments scheduled calendar printed per 9/8 los

## 2020-03-29 NOTE — Progress Notes (Signed)
DISCONTINUE ON PATHWAY REGIMEN - Multiple Myeloma     A cycle is every 21 days:     Bortezomib        Dose Mod: None     Lenalidomide        Dose Mod: None     Dexamethasone        Dose Mod: None  **Always confirm dose/schedule in your pharmacy ordering system**  REASON: Toxicities / Adverse Event PRIOR TREATMENT: [id number]: VRd (Bortezomib 1.3 mg/m2 Subcut D1, 8, 15 + Lenalidomide 25 mg + Dexamethasone 40 mg) q21 Days x 8 Cycles TREATMENT RESPONSE: Stable Disease (SD)  START ON PATHWAY REGIMEN - Multiple Myeloma and Other Plasma Cell Dyscrasias     Cycles 1 and 2: A cycle is every 28 days:     Daratumumab and hyaluronidase-fihj    Cycles 3 through 6: A cycle is every 28 days:     Daratumumab and hyaluronidase-fihj    Cycles 7 and beyond: A cycle is every 28 days:     Daratumumab and hyaluronidase-fihj   **Always confirm dose/schedule in your pharmacy ordering system**  Patient Characteristics: Multiple Myeloma, Relapsed / Refractory, Second through Fourth Lines of Therapy Disease Classification: Multiple Myeloma R-ISS Staging: Unknown Therapeutic Status: Relapsed Line of Therapy: Third Line Intent of Therapy: Curative Intent, Discussed with Patient

## 2020-03-29 NOTE — Progress Notes (Signed)
Hematology and Oncology Follow Up Visit  KYMBERLEE VIGER [id number] [birth year] 74 y.o. 03/29/2020   Principle Diagnosis:  IgG Kappa myeloma - compression fractures - 13q-, +11, 14q+ - hyperdiploid -- progressive Iron def anemia  Completed Therapy: S/p Kyphoplasty of T11 and T12 XRT to lower spine-completed on 09/25/2016  Current Therapy:    Faspro -- start on 04/05/2020 Ninlaro 2.3 mg po q week (3 on/1 off) -- start on 01/24/2020 -- d/c on 03/29/2020 due to toxicity Velcade/Pomalyst/Decadron -Start on 09/02/2018 --Pomalyst on hold since 11/09/2018.  D/C on 01/17/2020 Xgeva 184m Sq q  3 month - next dose in November 2021  IV Injectafer -- last dose given on 11/2018   Interim History:  Ms. JKincadeis here today for follow-up. Unfortunately, I think they were going to have to make a change with therapy. I does feel that the toxicity of the NKennieth Radis too much for Ms. JBlanch Media She has had bad itching. She has a bad rash on her back. This is the lowest dose of Ninlaro.  I do still want to see her miserable. She is itching. She is using a lot of Benadryl which is helping a little bit. I do have her on a steroid Dosepak.  I think that if we can still avoid IV therapy, subcutaneous Darzalex would be a good idea for her. I think that she would be able to tolerate this. My only concern is that she would have to come in weekly for 8 weeks, then every other week for 8 weeks. After that, she would then come in monthly.  She and her husband talked about this. She is agreeable to this.  Her last monoclonal studies done back in early August showed a M spike of 0.7 g/dL. Her IgG level was 950 mg/dL. Her kappa light chain was 6.9 mg/dL.  Otherwise, she seems to be doing okay. She doesn't have a little bit of diarrhea. She is having no cough. There is no bleeding. Her neck has been doing okay. She is try to hold off on any further neck surgery.  I would have to say that her performance status currently is  ECOG 1.  Medications:  Allergies as of 03/29/2020      Reactions   Crestor [rosuvastatin Calcium]    Causes liver functions to be elevated      Medication List       Accurate as of March 29, 2020  6:09 PM. If you have any questions, ask your nurse or doctor.        ALPRAZolam 0.25 MG tablet Commonly known as: XANAX Take 1 tablet (0.25 mg total) by mouth 2 (two) times daily as needed. for anxiety   amLODipine 5 MG tablet Commonly known as: NORVASC TAKE 1 TABLET(5 MG) BY MOUTH DAILY   aspirin 81 MG EC tablet Take 81 mg by mouth daily.   atorvastatin 20 MG tablet Commonly known as: LIPITOR Take 1 tablet (20 mg total) by mouth daily. TAKE 1 TABLET(20 MG) BY MOUTH DAILY   buPROPion 300 MG 24 hr tablet Commonly known as: Wellbutrin XL Take 1 tablet (300 mg total) by mouth daily.   dexamethasone 4 MG tablet Commonly known as: DECADRON TAKE 3 TABLETS BY MOUTH 1 TIME A WEEK   escitalopram 20 MG tablet Commonly known as: LEXAPRO TAKE 1 TABLET(20 MG) BY MOUTH DAILY   famciclovir 250 MG tablet Commonly known as: FAMVIR TAKE 1 TABLET(250 MG) BY MOUTH DAILY   gabapentin 300 MG capsule Commonly  known as: NEURONTIN Take 300 mg by mouth at bedtime.   IRON PO Take by mouth.   ixazomib citrate 2.3 MG capsule Commonly known as: Ninlaro Take 1 capsule (2.3 mg) by mouth weekly, 3 weeks on, 1 week off, repeat every 4 weeks. Take on an empty stomach 1hr before or 2hr after meals.   methylPREDNISolone 4 MG Tbpk tablet Commonly known as: MEDROL DOSEPAK Take as directed on package.   montelukast 10 MG tablet Commonly known as: SINGULAIR TAKE 1 TABLET(10 MG) BY MOUTH AT BEDTIME. START TAKING ON 04/02/2020 Started by: [name], MD   nebivolol 5 MG tablet Commonly known as: Bystolic Take 1 tablet (5 mg total) by mouth daily.   potassium chloride SA 20 MEQ tablet Commonly known as: KLOR-CON TAKE 1 TABLET(20 MEQ) BY MOUTH DAILY   prochlorperazine 10 MG  tablet Commonly known as: COMPAZINE TAKE 1 TABLET(10 MG) BY MOUTH EVERY 6 HOURS AS NEEDED FOR NAUSEA OR VOMITING   temazepam 7.5 MG capsule Commonly known as: RESTORIL TAKE 1 CAPSULE(7.5 MG) BY MOUTH AT BEDTIME AS NEEDED FOR SLEEP   Vitamin B-12 5000 MCG Tbdp Take by mouth.   Vitamin D 50 MCG (2000 UT) Caps Take 2,000 Units by mouth daily.       Allergies:  Allergies  Allergen Reactions  . Crestor [Rosuvastatin Calcium]     Causes liver functions to be elevated    Past Medical History, Surgical history, Social history, and Family History were reviewed and updated.  Review of Systems: Review of Systems  Constitutional: Negative.   HENT: Negative.   Eyes: Negative.   Respiratory: Negative.   Cardiovascular: Negative.   Gastrointestinal: Negative.   Genitourinary: Negative.   Musculoskeletal: Positive for back pain.  Skin: Negative.   Neurological: Negative.   Endo/Heme/Allergies: Negative.   Psychiatric/Behavioral: Negative.     Physical Exam:  weight is 111 lb 12 oz (50.7 kg). Her oral temperature is 97.4 F (36.3 C) (abnormal). Her blood pressure is 126/60 and her pulse is 86. Her respiration is 18 and oxygen saturation is 100%.   Wt Readings from Last 3 Encounters:  03/29/20 111 lb 12 oz (50.7 kg)  02/23/20 115 lb 8 oz (52.4 kg)  01/17/20 116 lb 4 oz (52.7 kg)    Physical Exam Vitals reviewed.  HENT:     Head: Normocephalic and atraumatic.  Eyes:     Pupils: Pupils are equal, round, and reactive to light.  Cardiovascular:     Rate and Rhythm: Normal rate and regular rhythm.     Heart sounds: Normal heart sounds.  Pulmonary:     Effort: Pulmonary effort is normal.     Breath sounds: Normal breath sounds.  Abdominal:     General: Bowel sounds are normal.     Palpations: Abdomen is soft.  Musculoskeletal:        General: No tenderness or deformity. Normal range of motion.     Cervical back: Normal range of motion.  Lymphadenopathy:     Cervical: No  cervical adenopathy.  Skin:    General: Skin is warm and dry.     Findings: No erythema or rash.  Neurological:     Mental Status: She is alert and oriented to person, place, and time.  Psychiatric:        Behavior: Behavior normal.        Thought Content: Thought content normal.        Judgment: Judgment normal.       Lab  Results  Component Value Date   WBC 11.3 (H) 03/29/2020   HGB 14.0 03/29/2020   HCT 42.1 03/29/2020   MCV 97.2 03/29/2020   PLT 240 03/29/2020   Lab Results  Component Value Date   FERRITIN 128 10/06/2019   IRON 168 (H) 10/06/2019   TIBC 326 10/06/2019   UIBC 159 10/06/2019   IRONPCTSAT 51 10/06/2019   Lab Results  Component Value Date   RBC 4.33 03/29/2020   Lab Results  Component Value Date   KPAFRELGTCHN 69.3 (H) 02/23/2020   LAMBDASER 3.3 (L) 02/23/2020   KAPLAMBRATIO 21.00 (H) 02/23/2020   Lab Results  Component Value Date   IGGSERUM 981 02/23/2020   IGA 15 (L) 02/23/2020   IGMSERUM 34 02/23/2020   Lab Results  Component Value Date   TOTALPROTELP 6.4 02/23/2020   ALBUMINELP 3.4 02/23/2020   A1GS 0.3 02/23/2020   A2GS 0.8 02/23/2020   BETS 1.0 02/23/2020   GAMS 0.8 02/23/2020   MSPIKE 0.7 (H) 02/23/2020   SPEI Comment 01/28/2018     Chemistry      Component Value Date/Time   NA 139 03/29/2020 1259   NA 148 (H) 07/16/2017 1128   NA 143 03/05/2017 1051   K 4.3 03/29/2020 1259   K 3.8 07/16/2017 1128   K 3.7 03/05/2017 1051   CL 100 03/29/2020 1259   CL 106 07/16/2017 1128   CO2 29 03/29/2020 1259   CO2 29 07/16/2017 1128   CO2 25 03/05/2017 1051   BUN 17 03/29/2020 1259   BUN 23 (H) 07/16/2017 1128   BUN 18.2 03/05/2017 1051   CREATININE 0.78 03/29/2020 1259   CREATININE 0.72 06/14/2019 0947   CREATININE 0.8 03/05/2017 1051      Component Value Date/Time   CALCIUM 10.0 03/29/2020 1259   CALCIUM 9.7 07/16/2017 1128   CALCIUM 8.9 03/05/2017 1051   ALKPHOS 62 03/29/2020 1259   ALKPHOS 71 07/16/2017 1128   ALKPHOS 67  03/05/2017 1051   AST 23 03/29/2020 1259   AST 15 03/05/2017 1051   ALT 27 03/29/2020 1259   ALT 23 07/16/2017 1128   ALT 17 03/05/2017 1051   BILITOT 0.6 03/29/2020 1259   BILITOT 0.55 03/05/2017 1051         Impression and Plan: Ms. Mault is a very pleasant 74 yo caucasian female with IgG Kappa Myeloma.    We will go ahead and make a change to subcutaneous daratumumab. I think this is very reasonable. I would like to believe that she will get a response with this.  We will get started next week. Again, I just hate that she has come in weekly for this. She understands that the frequency is only limited to 4 months. After that, then she can go monthly.  I will plan to see her back monthly. We will check her myeloma studies when I see her back.  I spent about 40-45 minutes with she and her husband. We had to make the recommendation for change in protocol. We had up with the protocol in.     [name], MD 9/8/20216:09 PM

## 2020-03-30 LAB — IGG, IGA, IGM
IgA: 16 mg/dL — ABNORMAL LOW (ref 64–422)
IgG (Immunoglobin G), Serum: 1260 mg/dL (ref 586–1602)
IgM (Immunoglobulin M), Srm: 37 mg/dL (ref 26–217)

## 2020-03-30 LAB — KAPPA/LAMBDA LIGHT CHAINS
Kappa free light chain: 65.7 mg/L — ABNORMAL HIGH (ref 3.3–19.4)
Kappa, lambda light chain ratio: 25.27 — ABNORMAL HIGH (ref 0.26–1.65)
Lambda free light chains: 2.6 mg/L — ABNORMAL LOW (ref 5.7–26.3)

## 2020-03-30 LAB — LACTATE DEHYDROGENASE: LDH: 276 U/L — ABNORMAL HIGH (ref 98–192)

## 2020-04-03 LAB — PROTEIN ELECTROPHORESIS, SERUM, WITH REFLEX
A/G Ratio: 1.2 (ref 0.7–1.7)
Albumin ELP: 3.7 g/dL (ref 2.9–4.4)
Alpha-1-Globulin: 0.3 g/dL (ref 0.0–0.4)
Alpha-2-Globulin: 0.8 g/dL (ref 0.4–1.0)
Beta Globulin: 1 g/dL (ref 0.7–1.3)
Gamma Globulin: 1.2 g/dL (ref 0.4–1.8)
Globulin, Total: 3.2 g/dL (ref 2.2–3.9)
M-Spike, %: 1 g/dL — ABNORMAL HIGH
SPEP Interpretation: 0
Total Protein ELP: 6.9 g/dL (ref 6.0–8.5)

## 2020-04-03 LAB — IMMUNOFIXATION REFLEX, SERUM
IgA: 15 mg/dL — ABNORMAL LOW (ref 64–422)
IgG (Immunoglobin G), Serum: 1239 mg/dL (ref 586–1602)
IgM (Immunoglobulin M), Srm: 27 mg/dL (ref 26–217)

## 2020-04-04 ENCOUNTER — Other Ambulatory Visit: Payer: Self-pay | Admitting: *Deleted

## 2020-04-04 DIAGNOSIS — C9000 Multiple myeloma not having achieved remission: Secondary | ICD-10-CM

## 2020-04-05 ENCOUNTER — Other Ambulatory Visit: Payer: Self-pay

## 2020-04-05 ENCOUNTER — Inpatient Hospital Stay: Payer: PPO

## 2020-04-05 ENCOUNTER — Other Ambulatory Visit: Payer: Self-pay | Admitting: *Deleted

## 2020-04-05 VITALS — BP 135/55 | HR 88 | Temp 97.5°F | Resp 20

## 2020-04-05 DIAGNOSIS — Z5112 Encounter for antineoplastic immunotherapy: Secondary | ICD-10-CM | POA: Diagnosis not present

## 2020-04-05 DIAGNOSIS — C9000 Multiple myeloma not having achieved remission: Secondary | ICD-10-CM

## 2020-04-05 LAB — TYPE AND SCREEN
ABO/RH(D): O POS
Antibody Screen: NEGATIVE

## 2020-04-05 MED ORDER — DEXAMETHASONE 4 MG PO TABS: 4.0000 mg | ORAL_TABLET | Freq: Every day | ORAL | 4 refills | Status: DC

## 2020-04-05 MED ORDER — DARATUMUMAB-HYALURONIDASE-FIHJ 1800-30000 MG-UT/15ML ~~LOC~~ SOLN
1800.0000 mg | Freq: Once | SUBCUTANEOUS | Status: AC
  Administered 2020-04-05: 1800 mg via SUBCUTANEOUS
  Filled 2020-04-05: qty 15

## 2020-04-05 MED ORDER — DIPHENHYDRAMINE HCL 25 MG PO CAPS
ORAL_CAPSULE | ORAL | Status: AC
  Filled 2020-04-05: qty 1

## 2020-04-05 MED ORDER — DEXAMETHASONE 4 MG PO TABS
ORAL_TABLET | ORAL | Status: AC
  Filled 2020-04-05: qty 2

## 2020-04-05 MED ORDER — DEXAMETHASONE 4 MG PO TABS
8.0000 mg | ORAL_TABLET | Freq: Once | ORAL | Status: AC
  Administered 2020-04-05: 8 mg via ORAL

## 2020-04-05 MED ORDER — ACETAMINOPHEN 325 MG PO TABS
ORAL_TABLET | ORAL | Status: AC
  Filled 2020-04-05: qty 2

## 2020-04-05 MED ORDER — MONTELUKAST SODIUM 10 MG PO TABS
10.0000 mg | ORAL_TABLET | Freq: Once | ORAL | Status: DC
  Filled 2020-04-05: qty 1

## 2020-04-05 MED ORDER — ACETAMINOPHEN 325 MG PO TABS
650.0000 mg | ORAL_TABLET | Freq: Once | ORAL | Status: AC
  Administered 2020-04-05: 650 mg via ORAL

## 2020-04-05 MED ORDER — DEXAMETHASONE 4 MG PO TABS
ORAL_TABLET | ORAL | Status: AC
  Filled 2020-04-05: qty 1

## 2020-04-05 MED ORDER — DIPHENHYDRAMINE HCL 25 MG PO CAPS
25.0000 mg | ORAL_CAPSULE | Freq: Once | ORAL | Status: AC
  Administered 2020-04-05: 25 mg via ORAL

## 2020-04-05 NOTE — Progress Notes (Signed)
Ok to treat with labs from 03/29/2020 per Dr Marin Olp. dph

## 2020-04-05 NOTE — Patient Instructions (Signed)
Daratumumab injection What is this medicine? DARATUMUMAB (dar a toom ue mab) is a monoclonal antibody. It is used to treat multiple myeloma. This medicine may be used for other purposes; ask your health care provider or pharmacist if you have questions. COMMON BRAND NAME(S): DARZALEX What should I tell my health care provider before I take this medicine? They need to know if you have any of these conditions:  infection (especially a virus infection such as chickenpox, herpes, or hepatitis B virus)  lung or breathing disease  an unusual or allergic reaction to daratumumab, other medicines, foods, dyes, or preservatives  pregnant or trying to get pregnant  breast-feeding How should I use this medicine? This medicine is for infusion into a vein. It is given by a health care professional in a hospital or clinic setting. Talk to your pediatrician regarding the use of this medicine in children. Special care may be needed. Overdosage: If you think you have taken too much of this medicine contact a poison control center or emergency room at once. NOTE: This medicine is only for you. Do not share this medicine with others. What if I miss a dose? Keep appointments for follow-up doses as directed. It is important not to miss your dose. Call your doctor or health care professional if you are unable to keep an appointment. What may interact with this medicine? Interactions have not been studied. This list may not describe all possible interactions. Give your health care provider a list of all the medicines, herbs, non-prescription drugs, or dietary supplements you use. Also tell them if you smoke, drink alcohol, or use illegal drugs. Some items may interact with your medicine. What should I watch for while using this medicine? This drug may make you feel generally unwell. Report any side effects. Continue your course of treatment even though you feel ill unless your doctor tells you to stop. This  medicine can cause serious allergic reactions. To reduce your risk you may need to take medicine before treatment with this medicine. Take your medicine as directed. This medicine can affect the results of blood tests to match your blood type. These changes can last for up to 6 months after the final dose. Your healthcare provider will do blood tests to match your blood type before you start treatment. Tell all of your healthcare providers that you are being treated with this medicine before receiving a blood transfusion. This medicine can affect the results of some tests used to determine treatment response; extra tests may be needed to evaluate response. Do not become pregnant while taking this medicine or for 3 months after stopping it. Women should inform their doctor if they wish to become pregnant or think they might be pregnant. There is a potential for serious side effects to an unborn child. Talk to your health care professional or pharmacist for more information. What side effects may I notice from receiving this medicine? Side effects that you should report to your doctor or health care professional as soon as possible:  allergic reactions like skin rash, itching or hives, swelling of the face, lips, or tongue  breathing problems  chills  cough  dizziness  feeling faint or lightheaded  headache  low blood counts - this medicine may decrease the number of white blood cells, red blood cells and platelets. You may be at increased risk for infections and bleeding.  nausea, vomiting  shortness of breath  signs of decreased platelets or bleeding - bruising, pinpoint red spots on   the skin, black, tarry stools, blood in the urine  signs of decreased red blood cells - unusually weak or tired, feeling faint or lightheaded, falls  signs of infection - fever or chills, cough, sore throat, pain or difficulty passing urine  signs and symptoms of liver injury like dark yellow or brown  urine; general ill feeling or flu-like symptoms; light-colored stools; loss of appetite; right upper belly pain; unusually weak or tired; yellowing of the eyes or skin Side effects that usually do not require medical attention (report to your doctor or health care professional if they continue or are bothersome):  back pain  constipation  diarrhea  joint pain  muscle cramps  pain, tingling, numbness in the hands or feet  swelling of the ankles, feet, hands  tiredness  trouble sleeping This list may not describe all possible side effects. Call your doctor for medical advice about side effects. You may report side effects to FDA at 1-800-FDA-1088. Where should I keep my medicine? This drug is given in a hospital or clinic and will not be stored at home. NOTE: This sheet is a summary. It may not cover all possible information. If you have questions about this medicine, talk to your doctor, pharmacist, or health care provider.  2020 Elsevier/Gold Standard (2019-03-16 18:10:54)  

## 2020-04-06 ENCOUNTER — Telehealth: Payer: Self-pay | Admitting: *Deleted

## 2020-04-06 NOTE — Telephone Encounter (Signed)
Message received from patient wanting to know if she needs to take Decadron prescription that was sent in yesterday for 4 mg daily times two days starting the day after chemo OR Decadron 12 mg weekly?  Dr. Marin Olp notified.  Call placed back to patient and patient notified per order of Dr. Marin Olp to continue Decadron 12 mg weekly as she has been doing.  Pt appreciative of call back and has no further questions at this time.

## 2020-04-07 LAB — PRETREATMENT RBC PHENOTYPE: DAT, IgG: NEGATIVE

## 2020-04-10 ENCOUNTER — Other Ambulatory Visit: Payer: Self-pay | Admitting: Hematology & Oncology

## 2020-04-12 ENCOUNTER — Inpatient Hospital Stay: Payer: PPO

## 2020-04-12 ENCOUNTER — Other Ambulatory Visit: Payer: Self-pay

## 2020-04-12 VITALS — BP 148/79 | HR 80 | Temp 98.4°F | Resp 17

## 2020-04-12 DIAGNOSIS — C9000 Multiple myeloma not having achieved remission: Secondary | ICD-10-CM

## 2020-04-12 DIAGNOSIS — Z5112 Encounter for antineoplastic immunotherapy: Secondary | ICD-10-CM | POA: Diagnosis not present

## 2020-04-12 LAB — CBC WITH DIFFERENTIAL (CANCER CENTER ONLY)
Abs Immature Granulocytes: 0.07 10*3/uL (ref 0.00–0.07)
Basophils Absolute: 0 10*3/uL (ref 0.0–0.1)
Basophils Relative: 0 %
Eosinophils Absolute: 0 10*3/uL (ref 0.0–0.5)
Eosinophils Relative: 0 %
HCT: 39.7 % (ref 36.0–46.0)
Hemoglobin: 13.2 g/dL (ref 12.0–15.0)
Immature Granulocytes: 1 %
Lymphocytes Relative: 12 %
Lymphs Abs: 1 10*3/uL (ref 0.7–4.0)
MCH: 32.2 pg (ref 26.0–34.0)
MCHC: 33.2 g/dL (ref 30.0–36.0)
MCV: 96.8 fL (ref 80.0–100.0)
Monocytes Absolute: 0.7 10*3/uL (ref 0.1–1.0)
Monocytes Relative: 9 %
Neutro Abs: 6.2 10*3/uL (ref 1.7–7.7)
Neutrophils Relative %: 78 %
Platelet Count: 192 10*3/uL (ref 150–400)
RBC: 4.1 MIL/uL (ref 3.87–5.11)
RDW: 13.2 % (ref 11.5–15.5)
WBC Count: 7.9 10*3/uL (ref 4.0–10.5)
nRBC: 0 % (ref 0.0–0.2)

## 2020-04-12 LAB — CMP (CANCER CENTER ONLY)
ALT: 15 U/L (ref 0–44)
AST: 14 U/L — ABNORMAL LOW (ref 15–41)
Albumin: 4.3 g/dL (ref 3.5–5.0)
Alkaline Phosphatase: 65 U/L (ref 38–126)
Anion gap: 9 (ref 5–15)
BUN: 20 mg/dL (ref 8–23)
CO2: 26 mmol/L (ref 22–32)
Calcium: 9.5 mg/dL (ref 8.9–10.3)
Chloride: 102 mmol/L (ref 98–111)
Creatinine: 0.72 mg/dL (ref 0.44–1.00)
GFR, Est AFR Am: >60 mL/min (ref >60)
GFR, Estimated: >60 mL/min (ref >60)
Glucose, Bld: 84 mg/dL (ref 70–99)
Potassium: 4 mmol/L (ref 3.5–5.1)
Sodium: 137 mmol/L (ref 135–145)
Total Bilirubin: 0.5 mg/dL (ref 0.3–1.2)
Total Protein: 7.1 g/dL (ref 6.5–8.1)

## 2020-04-12 MED ORDER — HEPARIN SOD (PORK) LOCK FLUSH 100 UNIT/ML IV SOLN
500.0000 [IU] | Freq: Once | INTRAVENOUS | Status: DC | PRN
  Filled 2020-04-12: qty 5

## 2020-04-12 MED ORDER — SODIUM CHLORIDE 0.9% FLUSH
10.0000 mL | INTRAVENOUS | Status: DC | PRN
  Filled 2020-04-12: qty 10

## 2020-04-12 MED ORDER — ACETAMINOPHEN 325 MG PO TABS
650.0000 mg | ORAL_TABLET | Freq: Once | ORAL | Status: AC
  Administered 2020-04-12: 650 mg via ORAL

## 2020-04-12 MED ORDER — MONTELUKAST SODIUM 10 MG PO TABS
10.0000 mg | ORAL_TABLET | Freq: Once | ORAL | Status: AC
  Administered 2020-04-12: 10 mg via ORAL
  Filled 2020-04-12: qty 1

## 2020-04-12 MED ORDER — SODIUM CHLORIDE 0.9 % IV SOLN
Freq: Once | INTRAVENOUS | Status: DC
  Filled 2020-04-12: qty 250

## 2020-04-12 MED ORDER — DARATUMUMAB-HYALURONIDASE-FIHJ 1800-30000 MG-UT/15ML ~~LOC~~ SOLN
1800.0000 mg | Freq: Once | SUBCUTANEOUS | Status: AC
  Administered 2020-04-12: 1800 mg via SUBCUTANEOUS
  Filled 2020-04-12: qty 15

## 2020-04-12 MED ORDER — DEXAMETHASONE 4 MG PO TABS
8.0000 mg | ORAL_TABLET | Freq: Once | ORAL | Status: AC
  Administered 2020-04-12: 8 mg via ORAL

## 2020-04-12 MED ORDER — DEXAMETHASONE 4 MG PO TABS
ORAL_TABLET | ORAL | Status: AC
  Filled 2020-04-12: qty 2

## 2020-04-12 MED ORDER — ACETAMINOPHEN 325 MG PO TABS
ORAL_TABLET | ORAL | Status: AC
  Filled 2020-04-12: qty 2

## 2020-04-12 MED ORDER — DIPHENHYDRAMINE HCL 25 MG PO CAPS
25.0000 mg | ORAL_CAPSULE | Freq: Once | ORAL | Status: AC
  Administered 2020-04-12: 25 mg via ORAL

## 2020-04-12 NOTE — Patient Instructions (Signed)
Daratumumab injection What is this medicine? DARATUMUMAB (dar a toom ue mab) is a monoclonal antibody. It is used to treat multiple myeloma. This medicine may be used for other purposes; ask your health care provider or pharmacist if you have questions. COMMON BRAND NAME(S): DARZALEX What should I tell my health care provider before I take this medicine? They need to know if you have any of these conditions:  infection (especially a virus infection such as chickenpox, herpes, or hepatitis B virus)  lung or breathing disease  an unusual or allergic reaction to daratumumab, other medicines, foods, dyes, or preservatives  pregnant or trying to get pregnant  breast-feeding How should I use this medicine? This medicine is for infusion into a vein. It is given by a health care professional in a hospital or clinic setting. Talk to your pediatrician regarding the use of this medicine in children. Special care may be needed. Overdosage: If you think you have taken too much of this medicine contact a poison control center or emergency room at once. NOTE: This medicine is only for you. Do not share this medicine with others. What if I miss a dose? Keep appointments for follow-up doses as directed. It is important not to miss your dose. Call your doctor or health care professional if you are unable to keep an appointment. What may interact with this medicine? Interactions have not been studied. This list may not describe all possible interactions. Give your health care provider a list of all the medicines, herbs, non-prescription drugs, or dietary supplements you use. Also tell them if you smoke, drink alcohol, or use illegal drugs. Some items may interact with your medicine. What should I watch for while using this medicine? This drug may make you feel generally unwell. Report any side effects. Continue your course of treatment even though you feel ill unless your doctor tells you to stop. This  medicine can cause serious allergic reactions. To reduce your risk you may need to take medicine before treatment with this medicine. Take your medicine as directed. This medicine can affect the results of blood tests to match your blood type. These changes can last for up to 6 months after the final dose. Your healthcare provider will do blood tests to match your blood type before you start treatment. Tell all of your healthcare providers that you are being treated with this medicine before receiving a blood transfusion. This medicine can affect the results of some tests used to determine treatment response; extra tests may be needed to evaluate response. Do not become pregnant while taking this medicine or for 3 months after stopping it. Women should inform their doctor if they wish to become pregnant or think they might be pregnant. There is a potential for serious side effects to an unborn child. Talk to your health care professional or pharmacist for more information. What side effects may I notice from receiving this medicine? Side effects that you should report to your doctor or health care professional as soon as possible:  allergic reactions like skin rash, itching or hives, swelling of the face, lips, or tongue  breathing problems  chills  cough  dizziness  feeling faint or lightheaded  headache  low blood counts - this medicine may decrease the number of white blood cells, red blood cells and platelets. You may be at increased risk for infections and bleeding.  nausea, vomiting  shortness of breath  signs of decreased platelets or bleeding - bruising, pinpoint red spots on   the skin, black, tarry stools, blood in the urine  signs of decreased red blood cells - unusually weak or tired, feeling faint or lightheaded, falls  signs of infection - fever or chills, cough, sore throat, pain or difficulty passing urine  signs and symptoms of liver injury like dark yellow or brown  urine; general ill feeling or flu-like symptoms; light-colored stools; loss of appetite; right upper belly pain; unusually weak or tired; yellowing of the eyes or skin Side effects that usually do not require medical attention (report to your doctor or health care professional if they continue or are bothersome):  back pain  constipation  diarrhea  joint pain  muscle cramps  pain, tingling, numbness in the hands or feet  swelling of the ankles, feet, hands  tiredness  trouble sleeping This list may not describe all possible side effects. Call your doctor for medical advice about side effects. You may report side effects to FDA at 1-800-FDA-1088. Where should I keep my medicine? This drug is given in a hospital or clinic and will not be stored at home. NOTE: This sheet is a summary. It may not cover all possible information. If you have questions about this medicine, talk to your doctor, pharmacist, or health care provider.  2020 Elsevier/Gold Standard (2019-03-16 18:10:54)  

## 2020-04-19 ENCOUNTER — Inpatient Hospital Stay: Payer: PPO

## 2020-04-19 ENCOUNTER — Other Ambulatory Visit: Payer: Self-pay

## 2020-04-19 VITALS — BP 141/62 | HR 92 | Temp 98.0°F | Resp 18 | Wt 110.8 lb

## 2020-04-19 DIAGNOSIS — Z5112 Encounter for antineoplastic immunotherapy: Secondary | ICD-10-CM | POA: Diagnosis not present

## 2020-04-19 DIAGNOSIS — C9000 Multiple myeloma not having achieved remission: Secondary | ICD-10-CM

## 2020-04-19 LAB — COMPREHENSIVE METABOLIC PANEL
ALT: 12 U/L (ref 0–44)
AST: 13 U/L — ABNORMAL LOW (ref 15–41)
Albumin: 4.2 g/dL (ref 3.5–5.0)
Alkaline Phosphatase: 56 U/L (ref 38–126)
Anion gap: 10 (ref 5–15)
BUN: 20 mg/dL (ref 8–23)
CO2: 25 mmol/L (ref 22–32)
Calcium: 10 mg/dL (ref 8.9–10.3)
Chloride: 103 mmol/L (ref 98–111)
Creatinine, Ser: 0.72 mg/dL (ref 0.44–1.00)
GFR calc Af Amer: >60 mL/min (ref >60)
GFR calc non Af Amer: >60 mL/min (ref >60)
Glucose, Bld: 170 mg/dL — ABNORMAL HIGH (ref 70–99)
Potassium: 4.7 mmol/L (ref 3.5–5.1)
Sodium: 138 mmol/L (ref 135–145)
Total Bilirubin: 0.5 mg/dL (ref 0.3–1.2)
Total Protein: 6.9 g/dL (ref 6.5–8.1)

## 2020-04-19 LAB — CBC WITH DIFFERENTIAL (CANCER CENTER ONLY)
Abs Immature Granulocytes: 0.06 10*3/uL (ref 0.00–0.07)
Basophils Absolute: 0 10*3/uL (ref 0.0–0.1)
Basophils Relative: 0 %
Eosinophils Absolute: 0 10*3/uL (ref 0.0–0.5)
Eosinophils Relative: 0 %
HCT: 39.8 % (ref 36.0–46.0)
Hemoglobin: 13.1 g/dL (ref 12.0–15.0)
Immature Granulocytes: 1 %
Lymphocytes Relative: 13 %
Lymphs Abs: 1 10*3/uL (ref 0.7–4.0)
MCH: 32 pg (ref 26.0–34.0)
MCHC: 32.9 g/dL (ref 30.0–36.0)
MCV: 97.3 fL (ref 80.0–100.0)
Monocytes Absolute: 0.5 10*3/uL (ref 0.1–1.0)
Monocytes Relative: 7 %
Neutro Abs: 5.8 10*3/uL (ref 1.7–7.7)
Neutrophils Relative %: 79 %
Platelet Count: 217 10*3/uL (ref 150–400)
RBC: 4.09 MIL/uL (ref 3.87–5.11)
RDW: 13.2 % (ref 11.5–15.5)
WBC Count: 7.3 10*3/uL (ref 4.0–10.5)
nRBC: 0 % (ref 0.0–0.2)

## 2020-04-19 MED ORDER — ACETAMINOPHEN 325 MG PO TABS
ORAL_TABLET | ORAL | Status: AC
  Filled 2020-04-19: qty 2

## 2020-04-19 MED ORDER — DARATUMUMAB-HYALURONIDASE-FIHJ 1800-30000 MG-UT/15ML ~~LOC~~ SOLN
1800.0000 mg | Freq: Once | SUBCUTANEOUS | Status: AC
  Administered 2020-04-19: 1800 mg via SUBCUTANEOUS
  Filled 2020-04-19: qty 15

## 2020-04-19 MED ORDER — DIPHENHYDRAMINE HCL 25 MG PO CAPS
ORAL_CAPSULE | ORAL | Status: AC
  Filled 2020-04-19: qty 2

## 2020-04-19 MED ORDER — DEXAMETHASONE 4 MG PO TABS
8.0000 mg | ORAL_TABLET | Freq: Once | ORAL | Status: AC
  Administered 2020-04-19: 8 mg via ORAL

## 2020-04-19 MED ORDER — ACETAMINOPHEN 325 MG PO TABS
650.0000 mg | ORAL_TABLET | Freq: Once | ORAL | Status: AC
  Administered 2020-04-19: 650 mg via ORAL

## 2020-04-19 MED ORDER — MONTELUKAST SODIUM 10 MG PO TABS
10.0000 mg | ORAL_TABLET | Freq: Once | ORAL | Status: DC
  Filled 2020-04-19: qty 1

## 2020-04-19 MED ORDER — DIPHENHYDRAMINE HCL 25 MG PO CAPS
25.0000 mg | ORAL_CAPSULE | Freq: Once | ORAL | Status: AC
  Administered 2020-04-19: 25 mg via ORAL

## 2020-04-19 MED ORDER — DEXAMETHASONE 4 MG PO TABS
ORAL_TABLET | ORAL | Status: AC
  Filled 2020-04-19: qty 2

## 2020-04-19 NOTE — Patient Instructions (Signed)
Daratumumab injection What is this medicine? DARATUMUMAB (dar a toom ue mab) is a monoclonal antibody. It is used to treat multiple myeloma. This medicine may be used for other purposes; ask your health care provider or pharmacist if you have questions. COMMON BRAND NAME(S): DARZALEX What should I tell my health care provider before I take this medicine? They need to know if you have any of these conditions:  infection (especially a virus infection such as chickenpox, herpes, or hepatitis B virus)  lung or breathing disease  an unusual or allergic reaction to daratumumab, other medicines, foods, dyes, or preservatives  pregnant or trying to get pregnant  breast-feeding How should I use this medicine? This medicine is for infusion into a vein. It is given by a health care professional in a hospital or clinic setting. Talk to your pediatrician regarding the use of this medicine in children. Special care may be needed. Overdosage: If you think you have taken too much of this medicine contact a poison control center or emergency room at once. NOTE: This medicine is only for you. Do not share this medicine with others. What if I miss a dose? Keep appointments for follow-up doses as directed. It is important not to miss your dose. Call your doctor or health care professional if you are unable to keep an appointment. What may interact with this medicine? Interactions have not been studied. This list may not describe all possible interactions. Give your health care provider a list of all the medicines, herbs, non-prescription drugs, or dietary supplements you use. Also tell them if you smoke, drink alcohol, or use illegal drugs. Some items may interact with your medicine. What should I watch for while using this medicine? This drug may make you feel generally unwell. Report any side effects. Continue your course of treatment even though you feel ill unless your doctor tells you to stop. This  medicine can cause serious allergic reactions. To reduce your risk you may need to take medicine before treatment with this medicine. Take your medicine as directed. This medicine can affect the results of blood tests to match your blood type. These changes can last for up to 6 months after the final dose. Your healthcare provider will do blood tests to match your blood type before you start treatment. Tell all of your healthcare providers that you are being treated with this medicine before receiving a blood transfusion. This medicine can affect the results of some tests used to determine treatment response; extra tests may be needed to evaluate response. Do not become pregnant while taking this medicine or for 3 months after stopping it. Women should inform their doctor if they wish to become pregnant or think they might be pregnant. There is a potential for serious side effects to an unborn child. Talk to your health care professional or pharmacist for more information. What side effects may I notice from receiving this medicine? Side effects that you should report to your doctor or health care professional as soon as possible:  allergic reactions like skin rash, itching or hives, swelling of the face, lips, or tongue  breathing problems  chills  cough  dizziness  feeling faint or lightheaded  headache  low blood counts - this medicine may decrease the number of white blood cells, red blood cells and platelets. You may be at increased risk for infections and bleeding.  nausea, vomiting  shortness of breath  signs of decreased platelets or bleeding - bruising, pinpoint red spots on  the skin, black, tarry stools, blood in the urine  signs of decreased red blood cells - unusually weak or tired, feeling faint or lightheaded, falls  signs of infection - fever or chills, cough, sore throat, pain or difficulty passing urine  signs and symptoms of liver injury like dark yellow or brown  urine; general ill feeling or flu-like symptoms; light-colored stools; loss of appetite; right upper belly pain; unusually weak or tired; yellowing of the eyes or skin Side effects that usually do not require medical attention (report to your doctor or health care professional if they continue or are bothersome):  back pain  constipation  diarrhea  joint pain  muscle cramps  pain, tingling, numbness in the hands or feet  swelling of the ankles, feet, hands  tiredness  trouble sleeping This list may not describe all possible side effects. Call your doctor for medical advice about side effects. You may report side effects to FDA at 1-800-FDA-1088. Where should I keep my medicine? This drug is given in a hospital or clinic and will not be stored at home. NOTE: This sheet is a summary. It may not cover all possible information. If you have questions about this medicine, talk to your doctor, pharmacist, or health care provider.  2020 Elsevier/Gold Standard (2019-03-16 18:10:54)  

## 2020-04-21 ENCOUNTER — Telehealth: Payer: Self-pay | Admitting: Internal Medicine

## 2020-04-21 NOTE — Telephone Encounter (Signed)
Scheduled appointment

## 2020-04-21 NOTE — Telephone Encounter (Signed)
Debra Ruiz [phone]  [name] called to say she received a phone call it was time for her colon screening, she would like to do the colorgaurd screening like we did last time where we sent thing in mail. How old are you when you don't have to do anymore?

## 2020-04-21 NOTE — Telephone Encounter (Signed)
After speaking with Dr Renold Genta she suggested office visit to discuss.

## 2020-04-25 ENCOUNTER — Other Ambulatory Visit: Payer: Self-pay

## 2020-04-25 ENCOUNTER — Ambulatory Visit (INDEPENDENT_AMBULATORY_CARE_PROVIDER_SITE_OTHER): Payer: PPO | Admitting: Internal Medicine

## 2020-04-25 VITALS — BP 102/70 | HR 84 | Ht <= 58 in | Wt 109.0 lb

## 2020-04-25 DIAGNOSIS — I10 Essential (primary) hypertension: Secondary | ICD-10-CM | POA: Diagnosis not present

## 2020-04-25 DIAGNOSIS — F32A Depression, unspecified: Secondary | ICD-10-CM

## 2020-04-25 DIAGNOSIS — E7849 Other hyperlipidemia: Secondary | ICD-10-CM

## 2020-04-25 DIAGNOSIS — C9001 Multiple myeloma in remission: Secondary | ICD-10-CM | POA: Diagnosis not present

## 2020-04-25 DIAGNOSIS — Z1211 Encounter for screening for malignant neoplasm of colon: Secondary | ICD-10-CM | POA: Diagnosis not present

## 2020-04-25 DIAGNOSIS — F419 Anxiety disorder, unspecified: Secondary | ICD-10-CM

## 2020-04-26 ENCOUNTER — Encounter: Payer: Self-pay | Admitting: Hematology & Oncology

## 2020-04-26 ENCOUNTER — Ambulatory Visit: Payer: PPO | Admitting: Hematology & Oncology

## 2020-04-26 ENCOUNTER — Telehealth: Payer: Self-pay | Admitting: Hematology & Oncology

## 2020-04-26 ENCOUNTER — Other Ambulatory Visit: Payer: PPO

## 2020-04-26 ENCOUNTER — Inpatient Hospital Stay: Payer: PPO | Attending: Hematology & Oncology

## 2020-04-26 ENCOUNTER — Inpatient Hospital Stay (HOSPITAL_BASED_OUTPATIENT_CLINIC_OR_DEPARTMENT_OTHER): Payer: PPO | Admitting: Hematology & Oncology

## 2020-04-26 ENCOUNTER — Inpatient Hospital Stay: Payer: PPO

## 2020-04-26 ENCOUNTER — Other Ambulatory Visit: Payer: Self-pay

## 2020-04-26 VITALS — BP 122/66 | HR 90 | Temp 98.2°F | Resp 18 | Wt 110.8 lb

## 2020-04-26 DIAGNOSIS — Z79899 Other long term (current) drug therapy: Secondary | ICD-10-CM | POA: Insufficient documentation

## 2020-04-26 DIAGNOSIS — C9000 Multiple myeloma not having achieved remission: Secondary | ICD-10-CM | POA: Insufficient documentation

## 2020-04-26 DIAGNOSIS — Z5112 Encounter for antineoplastic immunotherapy: Secondary | ICD-10-CM | POA: Insufficient documentation

## 2020-04-26 DIAGNOSIS — D509 Iron deficiency anemia, unspecified: Secondary | ICD-10-CM | POA: Insufficient documentation

## 2020-04-26 LAB — CMP (CANCER CENTER ONLY)
ALT: 10 U/L (ref 0–44)
AST: 14 U/L — ABNORMAL LOW (ref 15–41)
Albumin: 4.1 g/dL (ref 3.5–5.0)
Alkaline Phosphatase: 65 U/L (ref 38–126)
Anion gap: 8 (ref 5–15)
BUN: 13 mg/dL (ref 8–23)
CO2: 28 mmol/L (ref 22–32)
Calcium: 9.2 mg/dL (ref 8.9–10.3)
Chloride: 104 mmol/L (ref 98–111)
Creatinine: 0.75 mg/dL (ref 0.44–1.00)
GFR, Estimated: >60 mL/min (ref >60)
Glucose, Bld: 109 mg/dL — ABNORMAL HIGH (ref 70–99)
Potassium: 3.9 mmol/L (ref 3.5–5.1)
Sodium: 140 mmol/L (ref 135–145)
Total Bilirubin: 0.4 mg/dL (ref 0.3–1.2)
Total Protein: 6.5 g/dL (ref 6.5–8.1)

## 2020-04-26 LAB — CBC WITH DIFFERENTIAL (CANCER CENTER ONLY)
Abs Immature Granulocytes: 0.02 10*3/uL (ref 0.00–0.07)
Basophils Absolute: 0 10*3/uL (ref 0.0–0.1)
Basophils Relative: 0 %
Eosinophils Absolute: 0 10*3/uL (ref 0.0–0.5)
Eosinophils Relative: 1 %
HCT: 41.1 % (ref 36.0–46.0)
Hemoglobin: 13.4 g/dL (ref 12.0–15.0)
Immature Granulocytes: 0 %
Lymphocytes Relative: 15 %
Lymphs Abs: 0.8 10*3/uL (ref 0.7–4.0)
MCH: 32.3 pg (ref 26.0–34.0)
MCHC: 32.6 g/dL (ref 30.0–36.0)
MCV: 99 fL (ref 80.0–100.0)
Monocytes Absolute: 0.5 10*3/uL (ref 0.1–1.0)
Monocytes Relative: 10 %
Neutro Abs: 3.9 10*3/uL (ref 1.7–7.7)
Neutrophils Relative %: 74 %
Platelet Count: 188 10*3/uL (ref 150–400)
RBC: 4.15 MIL/uL (ref 3.87–5.11)
RDW: 13.9 % (ref 11.5–15.5)
WBC Count: 5.2 10*3/uL (ref 4.0–10.5)
nRBC: 0 % (ref 0.0–0.2)

## 2020-04-26 MED ORDER — DARATUMUMAB-HYALURONIDASE-FIHJ 1800-30000 MG-UT/15ML ~~LOC~~ SOLN
1800.0000 mg | Freq: Once | SUBCUTANEOUS | Status: AC
  Administered 2020-04-26: 1800 mg via SUBCUTANEOUS
  Filled 2020-04-26: qty 15

## 2020-04-26 MED ORDER — DEXAMETHASONE 4 MG PO TABS
8.0000 mg | ORAL_TABLET | Freq: Once | ORAL | Status: AC
  Administered 2020-04-26: 8 mg via ORAL

## 2020-04-26 MED ORDER — DIPHENHYDRAMINE HCL 25 MG PO CAPS
25.0000 mg | ORAL_CAPSULE | Freq: Once | ORAL | Status: AC
  Administered 2020-04-26: 25 mg via ORAL

## 2020-04-26 MED ORDER — DIPHENHYDRAMINE HCL 25 MG PO CAPS
ORAL_CAPSULE | ORAL | Status: AC
  Filled 2020-04-26: qty 1

## 2020-04-26 MED ORDER — ACETAMINOPHEN 325 MG PO TABS
ORAL_TABLET | ORAL | Status: AC
  Filled 2020-04-26: qty 2

## 2020-04-26 MED ORDER — ACETAMINOPHEN 325 MG PO TABS
650.0000 mg | ORAL_TABLET | Freq: Once | ORAL | Status: AC
  Administered 2020-04-26: 650 mg via ORAL

## 2020-04-26 MED ORDER — DEXAMETHASONE 4 MG PO TABS
ORAL_TABLET | ORAL | Status: AC
  Filled 2020-04-26: qty 2

## 2020-04-26 NOTE — Telephone Encounter (Signed)
Appointments scheduled calendar printed per 10/6 los

## 2020-04-26 NOTE — Progress Notes (Signed)
Hematology and Oncology Follow Up Visit  Debra Ruiz [id number] Oct 23, 1945 74 y.o. 04/26/2020   Principle Diagnosis:  IgG Kappa myeloma - compression fractures - 13q-, +11, 14q+ - hyperdiploid -- progressive Iron def anemia  Completed Therapy: S/p Kyphoplasty of T11 and T12 XRT to lower spine-completed on 09/25/2016  Current Therapy:    Faspro -- start on 04/05/2020 Ninlaro 2.3 mg po q week (3 on/1 off) -- start on 01/24/2020 -- d/c on 03/29/2020 due to toxicity Velcade/Pomalyst/Decadron -Start on 09/02/2018 --Pomalyst on hold since 11/09/2018.  D/C on 01/17/2020 Xgeva $RemoveBeforeDE'120mg'[id number]$  Sq q  3 month - next dose in November 2021  IV Injectafer -- last dose given on 11/2018   Interim History:  Debra Ruiz is here today for follow-up.  She seems to be tolerating the [place] pretty well.  She really has had no problems with the Faspro.  She, herself, says that the [place] was not causing her any problems.  She has had no cough or shortness of breath.  She has had no bleeding.  There is been no change in bowel or bladder habits.  She has had no leg swelling.  She has had no rashes.  There is been no headache.  I would have to say that her performance status currently is ECOG 1.  Medications:  Allergies as of 04/26/2020      Reactions   Crestor [rosuvastatin Calcium]    Causes liver functions to be elevated      Medication List       Accurate as of April 26, 2020 12:05 PM. If you have any questions, ask your nurse or doctor.        ALPRAZolam 0.25 MG tablet Commonly known as: XANAX Take 1 tablet (0.25 mg total) by mouth 2 (two) times daily as needed. for anxiety   amLODipine 5 MG tablet Commonly known as: NORVASC TAKE 1 TABLET(5 MG) BY MOUTH DAILY   aspirin 81 MG EC tablet Take 81 mg by mouth daily.   atorvastatin 20 MG tablet Commonly known as: LIPITOR Take 1 tablet (20 mg total) by mouth daily. TAKE 1 TABLET(20 MG) BY MOUTH DAILY   buPROPion 300 MG 24 hr tablet Commonly  known as: Wellbutrin XL Take 1 tablet (300 mg total) by mouth daily.   dexamethasone 4 MG tablet Commonly known as: DECADRON TAKE 3 TABLETS BY MOUTH 1 TIME A WEEK   dexamethasone 4 MG tablet Commonly known as: DECADRON Take 1 tablet (4 mg total) by mouth daily. Take daily x 2 days starting the day after chemo. Take with breakfast.   escitalopram 20 MG tablet Commonly known as: LEXAPRO TAKE 1 TABLET(20 MG) BY MOUTH DAILY   famciclovir 250 MG tablet Commonly known as: FAMVIR TAKE 1 TABLET(250 MG) BY MOUTH DAILY   gabapentin 300 MG capsule Commonly known as: NEURONTIN Take 300 mg by mouth at bedtime.   IRON PO Take by mouth.   ixazomib citrate 2.3 MG capsule Commonly known as: Ninlaro Take 1 capsule (2.3 mg) by mouth weekly, 3 weeks on, 1 week off, repeat every 4 weeks. Take on an empty stomach 1hr before or 2hr after meals.   methylPREDNISolone 4 MG Tbpk tablet Commonly known as: MEDROL DOSEPAK Take as directed on package.   montelukast 10 MG tablet Commonly known as: SINGULAIR TAKE 1 TABLET(10 MG) BY MOUTH AT BEDTIME. START TAKING ON 04/02/2020   nebivolol 5 MG tablet Commonly known as: Bystolic Take 1 tablet (5 mg total) by mouth daily.  potassium chloride SA 20 MEQ tablet Commonly known as: KLOR-CON TAKE 1 TABLET(20 MEQ) BY MOUTH DAILY   prochlorperazine 10 MG tablet Commonly known as: COMPAZINE TAKE 1 TABLET(10 MG) BY MOUTH EVERY 6 HOURS AS NEEDED FOR NAUSEA OR VOMITING   temazepam 7.5 MG capsule Commonly known as: RESTORIL TAKE 1 CAPSULE(7.5 MG) BY MOUTH AT BEDTIME AS NEEDED FOR SLEEP   Vitamin B-12 5000 MCG Tbdp Take by mouth.   Vitamin D 50 MCG (2000 UT) Caps Take 2,000 Units by mouth daily.       Allergies:  Allergies  Allergen Reactions  . Crestor [Rosuvastatin Calcium]     Causes liver functions to be elevated    Past Medical History, Surgical history, Social history, and Family History were reviewed and updated.  Review of  Systems: Review of Systems  Constitutional: Negative.   HENT: Negative.   Eyes: Negative.   Respiratory: Negative.   Cardiovascular: Negative.   Gastrointestinal: Negative.   Genitourinary: Negative.   Musculoskeletal: Positive for back pain.  Skin: Negative.   Neurological: Negative.   Endo/Heme/Allergies: Negative.   Psychiatric/Behavioral: Negative.     Physical Exam:  weight is 110 lb 12 oz (50.2 kg). Her oral temperature is 98.2 F (36.8 C). Her blood pressure is 122/66 and her pulse is 90. Her respiration is 18 and oxygen saturation is 99%.   Wt Readings from Last 3 Encounters:  04/26/20 110 lb 12 oz (50.2 kg)  04/25/20 109 lb (49.4 kg)  04/19/20 110 lb 12.8 oz (50.3 kg)    Physical Exam Vitals reviewed.  HENT:     Head: Normocephalic and atraumatic.  Eyes:     Pupils: Pupils are equal, round, and reactive to light.  Cardiovascular:     Rate and Rhythm: Normal rate and regular rhythm.     Heart sounds: Normal heart sounds.  Pulmonary:     Effort: Pulmonary effort is normal.     Breath sounds: Normal breath sounds.  Abdominal:     General: Bowel sounds are normal.     Palpations: Abdomen is soft.  Musculoskeletal:        General: No tenderness or deformity. Normal range of motion.     Cervical back: Normal range of motion.  Lymphadenopathy:     Cervical: No cervical adenopathy.  Skin:    General: Skin is warm and dry.     Findings: No erythema or rash.  Neurological:     Mental Status: She is alert and oriented to person, place, and time.  Psychiatric:        Behavior: Behavior normal.        Thought Content: Thought content normal.        Judgment: Judgment normal.       Lab Results  Component Value Date   WBC 5.2 04/26/2020   HGB 13.4 04/26/2020   HCT 41.1 04/26/2020   MCV 99.0 04/26/2020   PLT 188 04/26/2020   Lab Results  Component Value Date   FERRITIN 128 10/06/2019   IRON 168 (H) 10/06/2019   TIBC 326 10/06/2019   UIBC 159 10/06/2019    IRONPCTSAT 51 10/06/2019   Lab Results  Component Value Date   RBC 4.15 04/26/2020   Lab Results  Component Value Date   KPAFRELGTCHN 65.7 (H) 03/29/2020   LAMBDASER 2.6 (L) 03/29/2020   KAPLAMBRATIO 25.27 (H) 03/29/2020   Lab Results  Component Value Date   IGGSERUM 1,260 03/29/2020   IGGSERUM 1,239 03/29/2020   IGA 16 (L)  03/29/2020   IGA 15 (L) 03/29/2020   IGMSERUM 37 03/29/2020   IGMSERUM 27 03/29/2020   Lab Results  Component Value Date   TOTALPROTELP 6.9 03/29/2020   ALBUMINELP 3.7 03/29/2020   A1GS 0.3 03/29/2020   A2GS 0.8 03/29/2020   BETS 1.0 03/29/2020   GAMS 1.2 03/29/2020   MSPIKE 1.0 (H) 03/29/2020   SPEI Comment 01/28/2018     Chemistry      Component Value Date/Time   NA 140 04/26/2020 1033   NA 148 (H) 07/16/2017 1128   NA 143 03/05/2017 1051   K 3.9 04/26/2020 1033   K 3.8 07/16/2017 1128   K 3.7 03/05/2017 1051   CL 104 04/26/2020 1033   CL 106 07/16/2017 1128   CO2 28 04/26/2020 1033   CO2 29 07/16/2017 1128   CO2 25 03/05/2017 1051   BUN 13 04/26/2020 1033   BUN 23 (H) 07/16/2017 1128   BUN 18.2 03/05/2017 1051   CREATININE 0.75 04/26/2020 1033   CREATININE 0.72 06/14/2019 0947   CREATININE 0.8 03/05/2017 1051      Component Value Date/Time   CALCIUM 9.2 04/26/2020 1033   CALCIUM 9.7 07/16/2017 1128   CALCIUM 8.9 03/05/2017 1051   ALKPHOS 65 04/26/2020 1033   ALKPHOS 71 07/16/2017 1128   ALKPHOS 67 03/05/2017 1051   AST 14 (L) 04/26/2020 1033   AST 15 03/05/2017 1051   ALT 10 04/26/2020 1033   ALT 23 07/16/2017 1128   ALT 17 03/05/2017 1051   BILITOT 0.4 04/26/2020 1033   BILITOT 0.55 03/05/2017 1051         Impression and Plan: Ms. Flatley is a very pleasant 74 yo caucasian female with IgG Kappa Myeloma.    For right now, we will have to see what her myeloma studies look like.  I have to believe that they will be better since her total protein is starting to come back down.  We will continue her on the weekly Faspro.   I think she goes every 2 weeks in November.  I will plan to see her back in another month.       [name], MD 10/6/202112:05 PM

## 2020-04-26 NOTE — Patient Instructions (Signed)
Daratumumab injection What is this medicine? DARATUMUMAB (dar a toom ue mab) is a monoclonal antibody. It is used to treat multiple myeloma. This medicine may be used for other purposes; ask your health care provider or pharmacist if you have questions. COMMON BRAND NAME(S): DARZALEX What should I tell my health care provider before I take this medicine? They need to know if you have any of these conditions:  infection (especially a virus infection such as chickenpox, herpes, or hepatitis B virus)  lung or breathing disease  an unusual or allergic reaction to daratumumab, other medicines, foods, dyes, or preservatives  pregnant or trying to get pregnant  breast-feeding How should I use this medicine? This medicine is for infusion into a vein. It is given by a health care professional in a hospital or clinic setting. Talk to your pediatrician regarding the use of this medicine in children. Special care may be needed. Overdosage: If you think you have taken too much of this medicine contact a poison control center or emergency room at once. NOTE: This medicine is only for you. Do not share this medicine with others. What if I miss a dose? Keep appointments for follow-up doses as directed. It is important not to miss your dose. Call your doctor or health care professional if you are unable to keep an appointment. What may interact with this medicine? Interactions have not been studied. This list may not describe all possible interactions. Give your health care provider a list of all the medicines, herbs, non-prescription drugs, or dietary supplements you use. Also tell them if you smoke, drink alcohol, or use illegal drugs. Some items may interact with your medicine. What should I watch for while using this medicine? This drug may make you feel generally unwell. Report any side effects. Continue your course of treatment even though you feel ill unless your doctor tells you to stop. This  medicine can cause serious allergic reactions. To reduce your risk you may need to take medicine before treatment with this medicine. Take your medicine as directed. This medicine can affect the results of blood tests to match your blood type. These changes can last for up to 6 months after the final dose. Your healthcare provider will do blood tests to match your blood type before you start treatment. Tell all of your healthcare providers that you are being treated with this medicine before receiving a blood transfusion. This medicine can affect the results of some tests used to determine treatment response; extra tests may be needed to evaluate response. Do not become pregnant while taking this medicine or for 3 months after stopping it. Women should inform their doctor if they wish to become pregnant or think they might be pregnant. There is a potential for serious side effects to an unborn child. Talk to your health care professional or pharmacist for more information. What side effects may I notice from receiving this medicine? Side effects that you should report to your doctor or health care professional as soon as possible:  allergic reactions like skin rash, itching or hives, swelling of the face, lips, or tongue  breathing problems  chills  cough  dizziness  feeling faint or lightheaded  headache  low blood counts - this medicine may decrease the number of white blood cells, red blood cells and platelets. You may be at increased risk for infections and bleeding.  nausea, vomiting  shortness of breath  signs of decreased platelets or bleeding - bruising, pinpoint red spots on  the skin, black, tarry stools, blood in the urine  signs of decreased red blood cells - unusually weak or tired, feeling faint or lightheaded, falls  signs of infection - fever or chills, cough, sore throat, pain or difficulty passing urine  signs and symptoms of liver injury like dark yellow or brown  urine; general ill feeling or flu-like symptoms; light-colored stools; loss of appetite; right upper belly pain; unusually weak or tired; yellowing of the eyes or skin Side effects that usually do not require medical attention (report to your doctor or health care professional if they continue or are bothersome):  back pain  constipation  diarrhea  joint pain  muscle cramps  pain, tingling, numbness in the hands or feet  swelling of the ankles, feet, hands  tiredness  trouble sleeping This list may not describe all possible side effects. Call your doctor for medical advice about side effects. You may report side effects to FDA at 1-800-FDA-1088. Where should I keep my medicine? This drug is given in a hospital or clinic and will not be stored at home. NOTE: This sheet is a summary. It may not cover all possible information. If you have questions about this medicine, talk to your doctor, pharmacist, or health care provider.  2020 Elsevier/Gold Standard (2019-03-16 18:10:54)  

## 2020-04-27 LAB — KAPPA/LAMBDA LIGHT CHAINS
Kappa free light chain: 48.8 mg/L — ABNORMAL HIGH (ref 3.3–19.4)
Kappa, lambda light chain ratio: 25.68 — ABNORMAL HIGH (ref 0.26–1.65)
Lambda free light chains: 1.9 mg/L — ABNORMAL LOW (ref 5.7–26.3)

## 2020-04-27 LAB — IGG, IGA, IGM
IgA: 10 mg/dL — ABNORMAL LOW (ref 64–422)
IgG (Immunoglobin G), Serum: 973 mg/dL (ref 586–1602)
IgM (Immunoglobulin M), Srm: 35 mg/dL (ref 26–217)

## 2020-05-01 LAB — PROTEIN ELECTROPHORESIS, SERUM, WITH REFLEX
A/G Ratio: 1.3 (ref 0.7–1.7)
Albumin ELP: 3.6 g/dL (ref 2.9–4.4)
Alpha-1-Globulin: 0.2 g/dL (ref 0.0–0.4)
Alpha-2-Globulin: 0.7 g/dL (ref 0.4–1.0)
Beta Globulin: 1 g/dL (ref 0.7–1.3)
Gamma Globulin: 0.9 g/dL (ref 0.4–1.8)
Globulin, Total: 2.8 g/dL (ref 2.2–3.9)
M-Spike, %: 0.7 g/dL — ABNORMAL HIGH
SPEP Interpretation: 0
Total Protein ELP: 6.4 g/dL (ref 6.0–8.5)

## 2020-05-01 LAB — IMMUNOFIXATION REFLEX, SERUM
IgA: 11 mg/dL — ABNORMAL LOW (ref 64–422)
IgG (Immunoglobin G), Serum: 1009 mg/dL (ref 586–1602)
IgM (Immunoglobulin M), Srm: 32 mg/dL (ref 26–217)

## 2020-05-02 ENCOUNTER — Other Ambulatory Visit: Payer: Self-pay | Admitting: *Deleted

## 2020-05-02 DIAGNOSIS — C9000 Multiple myeloma not having achieved remission: Secondary | ICD-10-CM

## 2020-05-03 ENCOUNTER — Other Ambulatory Visit: Payer: Self-pay

## 2020-05-03 ENCOUNTER — Inpatient Hospital Stay: Payer: PPO

## 2020-05-03 VITALS — BP 131/64 | HR 78 | Temp 98.2°F | Resp 18

## 2020-05-03 DIAGNOSIS — Z5112 Encounter for antineoplastic immunotherapy: Secondary | ICD-10-CM | POA: Diagnosis not present

## 2020-05-03 DIAGNOSIS — C9000 Multiple myeloma not having achieved remission: Secondary | ICD-10-CM

## 2020-05-03 LAB — CBC WITH DIFFERENTIAL (CANCER CENTER ONLY)
Abs Immature Granulocytes: 0.06 10*3/uL (ref 0.00–0.07)
Basophils Absolute: 0 10*3/uL (ref 0.0–0.1)
Basophils Relative: 0 %
Eosinophils Absolute: 0 10*3/uL (ref 0.0–0.5)
Eosinophils Relative: 0 %
HCT: 40.9 % (ref 36.0–46.0)
Hemoglobin: 13.2 g/dL (ref 12.0–15.0)
Immature Granulocytes: 1 %
Lymphocytes Relative: 21 %
Lymphs Abs: 1 10*3/uL (ref 0.7–4.0)
MCH: 31.9 pg (ref 26.0–34.0)
MCHC: 32.3 g/dL (ref 30.0–36.0)
MCV: 98.8 fL (ref 80.0–100.0)
Monocytes Absolute: 0.6 10*3/uL (ref 0.1–1.0)
Monocytes Relative: 12 %
Neutro Abs: 3.2 10*3/uL (ref 1.7–7.7)
Neutrophils Relative %: 66 %
Platelet Count: 200 10*3/uL (ref 150–400)
RBC: 4.14 MIL/uL (ref 3.87–5.11)
RDW: 14.2 % (ref 11.5–15.5)
WBC Count: 4.9 10*3/uL (ref 4.0–10.5)
nRBC: 0 % (ref 0.0–0.2)

## 2020-05-03 LAB — COMPREHENSIVE METABOLIC PANEL
ALT: 10 U/L (ref 0–44)
AST: 13 U/L — ABNORMAL LOW (ref 15–41)
Albumin: 4.1 g/dL (ref 3.5–5.0)
Alkaline Phosphatase: 54 U/L (ref 38–126)
Anion gap: 7 (ref 5–15)
BUN: 19 mg/dL (ref 8–23)
CO2: 27 mmol/L (ref 22–32)
Calcium: 9.7 mg/dL (ref 8.9–10.3)
Chloride: 108 mmol/L (ref 98–111)
Creatinine, Ser: 0.76 mg/dL (ref 0.44–1.00)
GFR, Estimated: >60 mL/min (ref >60)
Glucose, Bld: 103 mg/dL — ABNORMAL HIGH (ref 70–99)
Potassium: 4.1 mmol/L (ref 3.5–5.1)
Sodium: 142 mmol/L (ref 135–145)
Total Bilirubin: 0.4 mg/dL (ref 0.3–1.2)
Total Protein: 6.4 g/dL — ABNORMAL LOW (ref 6.5–8.1)

## 2020-05-03 MED ORDER — ACETAMINOPHEN 325 MG PO TABS
650.0000 mg | ORAL_TABLET | Freq: Once | ORAL | Status: AC
  Administered 2020-05-03: 650 mg via ORAL

## 2020-05-03 MED ORDER — DEXAMETHASONE 4 MG PO TABS
ORAL_TABLET | ORAL | Status: AC
  Filled 2020-05-03: qty 2

## 2020-05-03 MED ORDER — ACETAMINOPHEN 325 MG PO TABS
ORAL_TABLET | ORAL | Status: AC
  Filled 2020-05-03: qty 2

## 2020-05-03 MED ORDER — DEXAMETHASONE 4 MG PO TABS
8.0000 mg | ORAL_TABLET | Freq: Once | ORAL | Status: AC
  Administered 2020-05-03: 8 mg via ORAL

## 2020-05-03 MED ORDER — DARATUMUMAB-HYALURONIDASE-FIHJ 1800-30000 MG-UT/15ML ~~LOC~~ SOLN
1800.0000 mg | Freq: Once | SUBCUTANEOUS | Status: AC
  Administered 2020-05-03: 1800 mg via SUBCUTANEOUS
  Filled 2020-05-03: qty 15

## 2020-05-03 MED ORDER — DIPHENHYDRAMINE HCL 25 MG PO CAPS
ORAL_CAPSULE | ORAL | Status: AC
  Filled 2020-05-03: qty 1

## 2020-05-03 MED ORDER — DIPHENHYDRAMINE HCL 25 MG PO CAPS
25.0000 mg | ORAL_CAPSULE | Freq: Once | ORAL | Status: AC
  Administered 2020-05-03: 25 mg via ORAL

## 2020-05-03 NOTE — Progress Notes (Signed)
Patient discharged stable.

## 2020-05-03 NOTE — Patient Instructions (Signed)
[facility] Discharge Instructions for Patients Receiving Chemotherapy  Today you received the following chemotherapy agents Darzalex Faspro  To help prevent nausea and vomiting after your treatment, we encourage you to take your nausea medication as prescribed by MD.   If you develop nausea and vomiting that is not controlled by your nausea medication, call the clinic.   BELOW ARE SYMPTOMS THAT SHOULD BE REPORTED IMMEDIATELY:  *FEVER GREATER THAN 100.5 F  *CHILLS WITH OR WITHOUT FEVER  NAUSEA AND VOMITING THAT IS NOT CONTROLLED WITH YOUR NAUSEA MEDICATION  *UNUSUAL SHORTNESS OF BREATH  *UNUSUAL BRUISING OR BLEEDING  TENDERNESS IN MOUTH AND THROAT WITH OR WITHOUT PRESENCE OF ULCERS  *URINARY PROBLEMS  *BOWEL PROBLEMS  UNUSUAL RASH Items with * indicate a potential emergency and should be followed up as soon as possible.  Feel free to call the clinic should you have any questions or concerns. The clinic phone number is [phone].  Please show the CHEMO ALERT CARD at check-in to the Emergency Department and triage nurse.   

## 2020-05-07 ENCOUNTER — Other Ambulatory Visit: Payer: Self-pay | Admitting: Internal Medicine

## 2020-05-07 DIAGNOSIS — E782 Mixed hyperlipidemia: Secondary | ICD-10-CM

## 2020-05-07 DIAGNOSIS — C9000 Multiple myeloma not having achieved remission: Secondary | ICD-10-CM

## 2020-05-09 ENCOUNTER — Other Ambulatory Visit: Payer: Self-pay | Admitting: Internal Medicine

## 2020-05-09 ENCOUNTER — Other Ambulatory Visit: Payer: Self-pay | Admitting: *Deleted

## 2020-05-09 DIAGNOSIS — C9000 Multiple myeloma not having achieved remission: Secondary | ICD-10-CM

## 2020-05-10 ENCOUNTER — Inpatient Hospital Stay: Payer: PPO

## 2020-05-10 ENCOUNTER — Other Ambulatory Visit: Payer: Self-pay

## 2020-05-10 ENCOUNTER — Other Ambulatory Visit: Payer: Self-pay | Admitting: Hematology & Oncology

## 2020-05-10 VITALS — BP 137/60 | HR 90 | Temp 98.2°F | Resp 17

## 2020-05-10 DIAGNOSIS — C9000 Multiple myeloma not having achieved remission: Secondary | ICD-10-CM

## 2020-05-10 DIAGNOSIS — Z5112 Encounter for antineoplastic immunotherapy: Secondary | ICD-10-CM | POA: Diagnosis not present

## 2020-05-10 DIAGNOSIS — Z1211 Encounter for screening for malignant neoplasm of colon: Secondary | ICD-10-CM | POA: Diagnosis not present

## 2020-05-10 LAB — CBC WITH DIFFERENTIAL (CANCER CENTER ONLY)
Abs Immature Granulocytes: 0.02 10*3/uL (ref 0.00–0.07)
Basophils Absolute: 0 10*3/uL (ref 0.0–0.1)
Basophils Relative: 0 %
Eosinophils Absolute: 0 10*3/uL (ref 0.0–0.5)
Eosinophils Relative: 1 %
HCT: 42.8 % (ref 36.0–46.0)
Hemoglobin: 13.8 g/dL (ref 12.0–15.0)
Immature Granulocytes: 0 %
Lymphocytes Relative: 20 %
Lymphs Abs: 1.3 10*3/uL (ref 0.7–4.0)
MCH: 31.9 pg (ref 26.0–34.0)
MCHC: 32.2 g/dL (ref 30.0–36.0)
MCV: 99.1 fL (ref 80.0–100.0)
Monocytes Absolute: 0.7 10*3/uL (ref 0.1–1.0)
Monocytes Relative: 11 %
Neutro Abs: 4.4 10*3/uL (ref 1.7–7.7)
Neutrophils Relative %: 68 %
Platelet Count: 169 10*3/uL (ref 150–400)
RBC: 4.32 MIL/uL (ref 3.87–5.11)
RDW: 14.1 % (ref 11.5–15.5)
WBC Count: 6.5 10*3/uL (ref 4.0–10.5)
nRBC: 0 % (ref 0.0–0.2)

## 2020-05-10 LAB — CMP (CANCER CENTER ONLY)
ALT: 9 U/L (ref 0–44)
AST: 13 U/L — ABNORMAL LOW (ref 15–41)
Albumin: 3.9 g/dL (ref 3.5–5.0)
Alkaline Phosphatase: 59 U/L (ref 38–126)
Anion gap: 8 (ref 5–15)
BUN: 16 mg/dL (ref 8–23)
CO2: 27 mmol/L (ref 22–32)
Calcium: 9.3 mg/dL (ref 8.9–10.3)
Chloride: 104 mmol/L (ref 98–111)
Creatinine: 0.75 mg/dL (ref 0.44–1.00)
GFR, Estimated: >60 mL/min (ref >60)
Glucose, Bld: 117 mg/dL — ABNORMAL HIGH (ref 70–99)
Potassium: 4.2 mmol/L (ref 3.5–5.1)
Sodium: 139 mmol/L (ref 135–145)
Total Bilirubin: 0.4 mg/dL (ref 0.3–1.2)
Total Protein: 6.7 g/dL (ref 6.5–8.1)

## 2020-05-10 MED ORDER — DIPHENHYDRAMINE HCL 25 MG PO CAPS
25.0000 mg | ORAL_CAPSULE | Freq: Once | ORAL | Status: AC
  Administered 2020-05-10: 25 mg via ORAL

## 2020-05-10 MED ORDER — DEXAMETHASONE 4 MG PO TABS
8.0000 mg | ORAL_TABLET | Freq: Once | ORAL | Status: AC
  Administered 2020-05-10: 8 mg via ORAL

## 2020-05-10 MED ORDER — ACETAMINOPHEN 325 MG PO TABS
ORAL_TABLET | ORAL | Status: AC
  Filled 2020-05-10: qty 2

## 2020-05-10 MED ORDER — DIPHENHYDRAMINE HCL 25 MG PO CAPS
ORAL_CAPSULE | ORAL | Status: AC
  Filled 2020-05-10: qty 1

## 2020-05-10 MED ORDER — DARATUMUMAB-HYALURONIDASE-FIHJ 1800-30000 MG-UT/15ML ~~LOC~~ SOLN
1800.0000 mg | Freq: Once | SUBCUTANEOUS | Status: AC
  Administered 2020-05-10: 1800 mg via SUBCUTANEOUS
  Filled 2020-05-10: qty 15

## 2020-05-10 MED ORDER — DEXAMETHASONE 4 MG PO TABS
ORAL_TABLET | ORAL | Status: AC
  Filled 2020-05-10: qty 2

## 2020-05-10 MED ORDER — ACETAMINOPHEN 325 MG PO TABS
650.0000 mg | ORAL_TABLET | Freq: Once | ORAL | Status: AC
  Administered 2020-05-10: 650 mg via ORAL

## 2020-05-10 NOTE — Patient Instructions (Signed)
Daratumumab injection What is this medicine? DARATUMUMAB (dar a toom ue mab) is a monoclonal antibody. It is used to treat multiple myeloma. This medicine may be used for other purposes; ask your health care provider or pharmacist if you have questions. COMMON BRAND NAME(S): DARZALEX What should I tell my health care provider before I take this medicine? They need to know if you have any of these conditions:  infection (especially a virus infection such as chickenpox, herpes, or hepatitis B virus)  lung or breathing disease  an unusual or allergic reaction to daratumumab, other medicines, foods, dyes, or preservatives  pregnant or trying to get pregnant  breast-feeding How should I use this medicine? This medicine is for infusion into a vein. It is given by a health care professional in a hospital or clinic setting. Talk to your pediatrician regarding the use of this medicine in children. Special care may be needed. Overdosage: If you think you have taken too much of this medicine contact a poison control center or emergency room at once. NOTE: This medicine is only for you. Do not share this medicine with others. What if I miss a dose? Keep appointments for follow-up doses as directed. It is important not to miss your dose. Call your doctor or health care professional if you are unable to keep an appointment. What may interact with this medicine? Interactions have not been studied. This list may not describe all possible interactions. Give your health care provider a list of all the medicines, herbs, non-prescription drugs, or dietary supplements you use. Also tell them if you smoke, drink alcohol, or use illegal drugs. Some items may interact with your medicine. What should I watch for while using this medicine? This drug may make you feel generally unwell. Report any side effects. Continue your course of treatment even though you feel ill unless your doctor tells you to stop. This  medicine can cause serious allergic reactions. To reduce your risk you may need to take medicine before treatment with this medicine. Take your medicine as directed. This medicine can affect the results of blood tests to match your blood type. These changes can last for up to 6 months after the final dose. Your healthcare provider will do blood tests to match your blood type before you start treatment. Tell all of your healthcare providers that you are being treated with this medicine before receiving a blood transfusion. This medicine can affect the results of some tests used to determine treatment response; extra tests may be needed to evaluate response. Do not become pregnant while taking this medicine or for 3 months after stopping it. Women should inform their doctor if they wish to become pregnant or think they might be pregnant. There is a potential for serious side effects to an unborn child. Talk to your health care professional or pharmacist for more information. What side effects may I notice from receiving this medicine? Side effects that you should report to your doctor or health care professional as soon as possible:  allergic reactions like skin rash, itching or hives, swelling of the face, lips, or tongue  breathing problems  chills  cough  dizziness  feeling faint or lightheaded  headache  low blood counts - this medicine may decrease the number of white blood cells, red blood cells and platelets. You may be at increased risk for infections and bleeding.  nausea, vomiting  shortness of breath  signs of decreased platelets or bleeding - bruising, pinpoint red spots on  the skin, black, tarry stools, blood in the urine  signs of decreased red blood cells - unusually weak or tired, feeling faint or lightheaded, falls  signs of infection - fever or chills, cough, sore throat, pain or difficulty passing urine  signs and symptoms of liver injury like dark yellow or brown  urine; general ill feeling or flu-like symptoms; light-colored stools; loss of appetite; right upper belly pain; unusually weak or tired; yellowing of the eyes or skin Side effects that usually do not require medical attention (report to your doctor or health care professional if they continue or are bothersome):  back pain  constipation  diarrhea  joint pain  muscle cramps  pain, tingling, numbness in the hands or feet  swelling of the ankles, feet, hands  tiredness  trouble sleeping This list may not describe all possible side effects. Call your doctor for medical advice about side effects. You may report side effects to FDA at 1-800-FDA-1088. Where should I keep my medicine? This drug is given in a hospital or clinic and will not be stored at home. NOTE: This sheet is a summary. It may not cover all possible information. If you have questions about this medicine, talk to your doctor, pharmacist, or health care provider.  2020 Elsevier/Gold Standard (2019-03-16 18:10:54)  

## 2020-05-16 ENCOUNTER — Other Ambulatory Visit: Payer: Self-pay | Admitting: *Deleted

## 2020-05-16 DIAGNOSIS — C9000 Multiple myeloma not having achieved remission: Secondary | ICD-10-CM

## 2020-05-17 ENCOUNTER — Inpatient Hospital Stay: Payer: PPO

## 2020-05-17 ENCOUNTER — Other Ambulatory Visit: Payer: Self-pay

## 2020-05-17 ENCOUNTER — Encounter: Payer: Self-pay | Admitting: *Deleted

## 2020-05-17 VITALS — BP 131/62 | HR 79 | Temp 98.1°F | Resp 16

## 2020-05-17 DIAGNOSIS — C9000 Multiple myeloma not having achieved remission: Secondary | ICD-10-CM

## 2020-05-17 DIAGNOSIS — Z5112 Encounter for antineoplastic immunotherapy: Secondary | ICD-10-CM | POA: Diagnosis not present

## 2020-05-17 LAB — CMP (CANCER CENTER ONLY)
ALT: 12 U/L (ref 0–44)
AST: 13 U/L — ABNORMAL LOW (ref 15–41)
Albumin: 4 g/dL (ref 3.5–5.0)
Alkaline Phosphatase: 52 U/L (ref 38–126)
Anion gap: 8 (ref 5–15)
BUN: 19 mg/dL (ref 8–23)
CO2: 27 mmol/L (ref 22–32)
Calcium: 9.2 mg/dL (ref 8.9–10.3)
Chloride: 106 mmol/L (ref 98–111)
Creatinine: 0.73 mg/dL (ref 0.44–1.00)
GFR, Estimated: >60 mL/min (ref >60)
Glucose, Bld: 111 mg/dL — ABNORMAL HIGH (ref 70–99)
Potassium: 3.8 mmol/L (ref 3.5–5.1)
Sodium: 141 mmol/L (ref 135–145)
Total Bilirubin: 0.4 mg/dL (ref 0.3–1.2)
Total Protein: 6.7 g/dL (ref 6.5–8.1)

## 2020-05-17 LAB — CBC WITH DIFFERENTIAL (CANCER CENTER ONLY)
Abs Immature Granulocytes: 0.04 10*3/uL (ref 0.00–0.07)
Basophils Absolute: 0 10*3/uL (ref 0.0–0.1)
Basophils Relative: 0 %
Eosinophils Absolute: 0 10*3/uL (ref 0.0–0.5)
Eosinophils Relative: 1 %
HCT: 40.8 % (ref 36.0–46.0)
Hemoglobin: 13.5 g/dL (ref 12.0–15.0)
Immature Granulocytes: 1 %
Lymphocytes Relative: 16 %
Lymphs Abs: 1.1 10*3/uL (ref 0.7–4.0)
MCH: 32.5 pg (ref 26.0–34.0)
MCHC: 33.1 g/dL (ref 30.0–36.0)
MCV: 98.1 fL (ref 80.0–100.0)
Monocytes Absolute: 0.7 10*3/uL (ref 0.1–1.0)
Monocytes Relative: 11 %
Neutro Abs: 4.7 10*3/uL (ref 1.7–7.7)
Neutrophils Relative %: 71 %
Platelet Count: 205 10*3/uL (ref 150–400)
RBC: 4.16 MIL/uL (ref 3.87–5.11)
RDW: 14.2 % (ref 11.5–15.5)
WBC Count: 6.6 10*3/uL (ref 4.0–10.5)
nRBC: 0 % (ref 0.0–0.2)

## 2020-05-17 MED ORDER — DEXAMETHASONE 4 MG PO TABS
ORAL_TABLET | ORAL | Status: AC
  Filled 2020-05-17: qty 2

## 2020-05-17 MED ORDER — DARATUMUMAB-HYALURONIDASE-FIHJ 1800-30000 MG-UT/15ML ~~LOC~~ SOLN
1800.0000 mg | Freq: Once | SUBCUTANEOUS | Status: AC
  Administered 2020-05-17: 1800 mg via SUBCUTANEOUS
  Filled 2020-05-17: qty 15

## 2020-05-17 MED ORDER — DEXAMETHASONE 4 MG PO TABS
8.0000 mg | ORAL_TABLET | Freq: Once | ORAL | Status: AC
  Administered 2020-05-17: 8 mg via ORAL

## 2020-05-17 MED ORDER — DIPHENHYDRAMINE HCL 25 MG PO CAPS
ORAL_CAPSULE | ORAL | Status: AC
  Filled 2020-05-17: qty 1

## 2020-05-17 MED ORDER — DIPHENHYDRAMINE HCL 25 MG PO CAPS
25.0000 mg | ORAL_CAPSULE | Freq: Once | ORAL | Status: AC
  Administered 2020-05-17: 25 mg via ORAL

## 2020-05-17 MED ORDER — ACETAMINOPHEN 325 MG PO TABS
650.0000 mg | ORAL_TABLET | Freq: Once | ORAL | Status: AC
  Administered 2020-05-17: 650 mg via ORAL

## 2020-05-17 MED ORDER — ACETAMINOPHEN 325 MG PO TABS
ORAL_TABLET | ORAL | Status: AC
  Filled 2020-05-17: qty 2

## 2020-05-17 NOTE — Patient Instructions (Signed)
Daratumumab injection What is this medicine? DARATUMUMAB (dar a toom ue mab) is a monoclonal antibody. It is used to treat multiple myeloma. This medicine may be used for other purposes; ask your health care provider or pharmacist if you have questions. COMMON BRAND NAME(S): DARZALEX What should I tell my health care provider before I take this medicine? They need to know if you have any of these conditions:  infection (especially a virus infection such as chickenpox, herpes, or hepatitis B virus)  lung or breathing disease  an unusual or allergic reaction to daratumumab, other medicines, foods, dyes, or preservatives  pregnant or trying to get pregnant  breast-feeding How should I use this medicine? This medicine is for infusion into a vein. It is given by a health care professional in a hospital or clinic setting. Talk to your pediatrician regarding the use of this medicine in children. Special care may be needed. Overdosage: If you think you have taken too much of this medicine contact a poison control center or emergency room at once. NOTE: This medicine is only for you. Do not share this medicine with others. What if I miss a dose? Keep appointments for follow-up doses as directed. It is important not to miss your dose. Call your doctor or health care professional if you are unable to keep an appointment. What may interact with this medicine? Interactions have not been studied. This list may not describe all possible interactions. Give your health care provider a list of all the medicines, herbs, non-prescription drugs, or dietary supplements you use. Also tell them if you smoke, drink alcohol, or use illegal drugs. Some items may interact with your medicine. What should I watch for while using this medicine? This drug may make you feel generally unwell. Report any side effects. Continue your course of treatment even though you feel ill unless your doctor tells you to stop. This  medicine can cause serious allergic reactions. To reduce your risk you may need to take medicine before treatment with this medicine. Take your medicine as directed. This medicine can affect the results of blood tests to match your blood type. These changes can last for up to 6 months after the final dose. Your healthcare provider will do blood tests to match your blood type before you start treatment. Tell all of your healthcare providers that you are being treated with this medicine before receiving a blood transfusion. This medicine can affect the results of some tests used to determine treatment response; extra tests may be needed to evaluate response. Do not become pregnant while taking this medicine or for 3 months after stopping it. Women should inform their doctor if they wish to become pregnant or think they might be pregnant. There is a potential for serious side effects to an unborn child. Talk to your health care professional or pharmacist for more information. What side effects may I notice from receiving this medicine? Side effects that you should report to your doctor or health care professional as soon as possible:  allergic reactions like skin rash, itching or hives, swelling of the face, lips, or tongue  breathing problems  chills  cough  dizziness  feeling faint or lightheaded  headache  low blood counts - this medicine may decrease the number of white blood cells, red blood cells and platelets. You may be at increased risk for infections and bleeding.  nausea, vomiting  shortness of breath  signs of decreased platelets or bleeding - bruising, pinpoint red spots on  the skin, black, tarry stools, blood in the urine  signs of decreased red blood cells - unusually weak or tired, feeling faint or lightheaded, falls  signs of infection - fever or chills, cough, sore throat, pain or difficulty passing urine  signs and symptoms of liver injury like dark yellow or brown  urine; general ill feeling or flu-like symptoms; light-colored stools; loss of appetite; right upper belly pain; unusually weak or tired; yellowing of the eyes or skin Side effects that usually do not require medical attention (report to your doctor or health care professional if they continue or are bothersome):  back pain  constipation  diarrhea  joint pain  muscle cramps  pain, tingling, numbness in the hands or feet  swelling of the ankles, feet, hands  tiredness  trouble sleeping This list may not describe all possible side effects. Call your doctor for medical advice about side effects. You may report side effects to FDA at 1-800-FDA-1088. Where should I keep my medicine? This drug is given in a hospital or clinic and will not be stored at home. NOTE: This sheet is a summary. It may not cover all possible information. If you have questions about this medicine, talk to your doctor, pharmacist, or health care provider.  2020 Elsevier/Gold Standard (2019-03-16 18:10:54)  

## 2020-05-17 NOTE — Progress Notes (Signed)
Pt discharged in no apparent distress. Pt left ambulatory without assistance. Pt aware of discharge instructions and verbalized understanding and had no further questions.  

## 2020-05-19 ENCOUNTER — Encounter: Payer: Self-pay | Admitting: Internal Medicine

## 2020-05-19 NOTE — Patient Instructions (Signed)
It was a pleasure to see you today.  Cologuard test will be ordered.  Follow-up in December for health maintenance exam.

## 2020-05-19 NOTE — Progress Notes (Signed)
   Subjective:    Patient ID: Debra Ruiz, female    DOB: November 09, 1945, 74 y.o.   MRN: [id number]  HPI 74 year old Female in to discuss Cologard vs Colonoscopy. She received a phone call about colon cancer screening from [facility]. Asked patient to come in and discuss options.  Patient sees Dr. Marin Olp for multiple myeloma treatment and is on Daratumumab  Infusions q 28 days and Xgeva q 3 months.  Hx of Herpes Simplex Type 1, hypertension, hyperlipidemia, anxiety depression, GE reflux and insomnia.  Records indicate she has never had colonoscopy.  Records indicate she has no known family history of colon cancer.  She did have a Cologuard test that was negative in 2017.  This result is under the media section in Epic.  She is agreeable to doing this screen again.  Does not want to have colonoscopy.   History of anxiety treated with Xanax.  Takes Restoril at night to sleep if needed.  Is on Lexapro 20 mg daily for depression.   Review of Systems see above     Objective:   Physical Exam Blood pressure 102/70.  BMI 23.18 weight 109 pounds pulse 84 regular, pulse oximetry 98%  Skin warm and dry.  No cervical adenopathy.  No carotid bruits.  Chest is clear to auscultation.  Cardiac exam regular rate and rhythm normal S1 and S2 without murmurs or gallops.  No lower extremity pitting edema.  Affect thought and judgment are normal.       Assessment & Plan:  Discussion regarding colon cancer screening.  Patient prefers to do Cologuard.  Order will be placed.  Multiple myeloma treated by Dr. Marin Olp and stable  Essential hypertension-stable on current regimen  Plan: She is due for physical exam here December 2.

## 2020-05-22 ENCOUNTER — Telehealth: Payer: Self-pay | Admitting: Internal Medicine

## 2020-05-22 DIAGNOSIS — I10 Essential (primary) hypertension: Secondary | ICD-10-CM | POA: Diagnosis not present

## 2020-05-22 DIAGNOSIS — H04123 Dry eye syndrome of bilateral lacrimal glands: Secondary | ICD-10-CM | POA: Diagnosis not present

## 2020-05-22 DIAGNOSIS — Z961 Presence of intraocular lens: Secondary | ICD-10-CM | POA: Diagnosis not present

## 2020-05-22 NOTE — Telephone Encounter (Signed)
Cologard test came back positive. Called to let her know. She will need further evaluation with colonoscopy. She wants to wait and discuss this at visit here in about 4 weeks

## 2020-05-24 ENCOUNTER — Other Ambulatory Visit: Payer: Self-pay

## 2020-05-24 ENCOUNTER — Encounter: Payer: Self-pay | Admitting: Hematology & Oncology

## 2020-05-24 ENCOUNTER — Inpatient Hospital Stay: Payer: PPO | Attending: Hematology & Oncology

## 2020-05-24 ENCOUNTER — Inpatient Hospital Stay (HOSPITAL_BASED_OUTPATIENT_CLINIC_OR_DEPARTMENT_OTHER): Payer: PPO | Admitting: Hematology & Oncology

## 2020-05-24 ENCOUNTER — Inpatient Hospital Stay: Payer: PPO

## 2020-05-24 VITALS — BP 122/59 | HR 83 | Temp 98.2°F | Resp 16 | Wt 111.8 lb

## 2020-05-24 DIAGNOSIS — Z79899 Other long term (current) drug therapy: Secondary | ICD-10-CM | POA: Diagnosis not present

## 2020-05-24 DIAGNOSIS — C9000 Multiple myeloma not having achieved remission: Secondary | ICD-10-CM

## 2020-05-24 DIAGNOSIS — Z5112 Encounter for antineoplastic immunotherapy: Secondary | ICD-10-CM | POA: Diagnosis not present

## 2020-05-24 LAB — CBC WITH DIFFERENTIAL (CANCER CENTER ONLY)
Abs Immature Granulocytes: 0.04 10*3/uL (ref 0.00–0.07)
Basophils Absolute: 0 10*3/uL (ref 0.0–0.1)
Basophils Relative: 0 %
Eosinophils Absolute: 0.1 10*3/uL (ref 0.0–0.5)
Eosinophils Relative: 1 %
HCT: 42.7 % (ref 36.0–46.0)
Hemoglobin: 14.1 g/dL (ref 12.0–15.0)
Immature Granulocytes: 1 %
Lymphocytes Relative: 17 %
Lymphs Abs: 1 10*3/uL (ref 0.7–4.0)
MCH: 32.1 pg (ref 26.0–34.0)
MCHC: 33 g/dL (ref 30.0–36.0)
MCV: 97.3 fL (ref 80.0–100.0)
Monocytes Absolute: 0.6 10*3/uL (ref 0.1–1.0)
Monocytes Relative: 11 %
Neutro Abs: 4 10*3/uL (ref 1.7–7.7)
Neutrophils Relative %: 70 %
Platelet Count: 191 10*3/uL (ref 150–400)
RBC: 4.39 MIL/uL (ref 3.87–5.11)
RDW: 13.7 % (ref 11.5–15.5)
WBC Count: 5.7 10*3/uL (ref 4.0–10.5)
nRBC: 0 % (ref 0.0–0.2)

## 2020-05-24 LAB — CMP (CANCER CENTER ONLY)
ALT: 11 U/L (ref 0–44)
AST: 14 U/L — ABNORMAL LOW (ref 15–41)
Albumin: 4.1 g/dL (ref 3.5–5.0)
Alkaline Phosphatase: 69 U/L (ref 38–126)
Anion gap: 7 (ref 5–15)
BUN: 14 mg/dL (ref 8–23)
CO2: 29 mmol/L (ref 22–32)
Calcium: 9.7 mg/dL (ref 8.9–10.3)
Chloride: 103 mmol/L (ref 98–111)
Creatinine: 0.72 mg/dL (ref 0.44–1.00)
GFR, Estimated: >60 mL/min (ref >60)
Glucose, Bld: 94 mg/dL (ref 70–99)
Potassium: 3.9 mmol/L (ref 3.5–5.1)
Sodium: 139 mmol/L (ref 135–145)
Total Bilirubin: 0.4 mg/dL (ref 0.3–1.2)
Total Protein: 6.7 g/dL (ref 6.5–8.1)

## 2020-05-24 MED ORDER — ACETAMINOPHEN 325 MG PO TABS
ORAL_TABLET | ORAL | Status: AC
  Filled 2020-05-24: qty 2

## 2020-05-24 MED ORDER — DEXAMETHASONE 4 MG PO TABS
8.0000 mg | ORAL_TABLET | Freq: Once | ORAL | Status: AC
  Administered 2020-05-24: 8 mg via ORAL

## 2020-05-24 MED ORDER — DEXAMETHASONE 4 MG PO TABS
ORAL_TABLET | ORAL | Status: AC
  Filled 2020-05-24: qty 2

## 2020-05-24 MED ORDER — DARATUMUMAB-HYALURONIDASE-FIHJ 1800-30000 MG-UT/15ML ~~LOC~~ SOLN
1800.0000 mg | Freq: Once | SUBCUTANEOUS | Status: AC
  Administered 2020-05-24: 1800 mg via SUBCUTANEOUS
  Filled 2020-05-24: qty 15

## 2020-05-24 MED ORDER — DIPHENHYDRAMINE HCL 25 MG PO CAPS
ORAL_CAPSULE | ORAL | Status: AC
  Filled 2020-05-24: qty 1

## 2020-05-24 MED ORDER — ACETAMINOPHEN 325 MG PO TABS
650.0000 mg | ORAL_TABLET | Freq: Once | ORAL | Status: AC
  Administered 2020-05-24: 650 mg via ORAL

## 2020-05-24 MED ORDER — DIPHENHYDRAMINE HCL 25 MG PO CAPS
25.0000 mg | ORAL_CAPSULE | Freq: Once | ORAL | Status: AC
  Administered 2020-05-24: 25 mg via ORAL

## 2020-05-24 MED ORDER — DENOSUMAB 120 MG/1.7ML ~~LOC~~ SOLN
120.0000 mg | Freq: Once | SUBCUTANEOUS | Status: AC
  Administered 2020-05-24: 120 mg via SUBCUTANEOUS

## 2020-05-24 MED ORDER — DENOSUMAB 120 MG/1.7ML ~~LOC~~ SOLN
SUBCUTANEOUS | Status: AC
  Filled 2020-05-24: qty 1.7

## 2020-05-24 NOTE — Progress Notes (Signed)
Pt discharged in no apparent distress. Pt left ambulatory without assistance. Pt aware of discharge instructions and verbalized understanding and had no further questions.  

## 2020-05-24 NOTE — Progress Notes (Signed)
Hematology and Oncology Follow Up Visit  Debra Ruiz [id number] 07-23-1945 74 y.o. 05/24/2020   Principle Diagnosis:  IgG Kappa myeloma - compression fractures - 13q-, +11, 14q+ - hyperdiploid -- progressive Iron def anemia  Completed Therapy: S/p Kyphoplasty of T11 and T12 XRT to lower spine-completed on 09/25/2016  Current Therapy:    Faspro -- start on 04/05/2020 -- s/p cycle #1 Ninlaro 2.3 mg po q week (3 on/1 off) -- start on 01/24/2020 -- d/c on 03/29/2020 due to toxicity Velcade/Pomalyst/Decadron -Start on 09/02/2018 --Pomalyst on hold since 11/09/2018.  D/C on 01/17/2020 Xgeva 171m Sq q  3 month - next dose in 08/2020  IV Injectafer -- last dose given on 11/2018   Interim History:  Debra Ruiz here today for follow-up.  She is doing pretty well.  She really has had no specific complaints.  She actually had a Cologuard test recently that was positive.  She has not had a colonoscopy for quite a while.  She does not want to take a colonoscopy because she cannot handle the liquid prep for this.  I told Debra Ruiz that they might build to give Debra Ruiz some laxative pills to take to try to help clean Debra Ruiz out.  I do think that it is important that she have a colonoscopy.  The myeloma is not much of a problem right now.  She is responding to treatment.  Debra Ruiz last monoclonal spike was 0.7 g/dL.  Debra Ruiz IgG level was 1000 mg/dL.  The kappa light chain was 4.9 mg/dL.  She had a good appetite.  She had no nausea or vomiting.  She has had no cough.  She has had no rashes.  Debra Ruiz lower back does bother Debra Ruiz.  This is been an issue for quite a while.  Currently, I would say performance status is ECOG 1.   Medications:  Allergies as of 05/24/2020      Reactions   Crestor [rosuvastatin Calcium]    Causes liver functions to be elevated      Medication List       Accurate as of May 24, 2020 10:33 AM. If you have any questions, ask your nurse or doctor.        STOP taking these medications    gabapentin 300 MG capsule Commonly known as: NEURONTIN Stopped by: PVolanda Napoleon MD   ixazomib citrate 2.3 MG capsule Commonly known as: Ninlaro Stopped by: PVolanda Napoleon MD   methylPREDNISolone 4 MG Tbpk tablet Commonly known as: MEDROL DOSEPAK Stopped by: PVolanda Napoleon MD     TAKE these medications   ALPRAZolam 0.25 MG tablet Commonly known as: XANAX Take 1 tablet (0.25 mg total) by mouth 2 (two) times daily as needed. for anxiety   amLODipine 5 MG tablet Commonly known as: NORVASC TAKE 1 TABLET(5 MG) BY MOUTH DAILY   aspirin 81 MG EC tablet Take 81 mg by mouth daily.   atorvastatin 20 MG tablet Commonly known as: LIPITOR TAKE 1 TABLET(20 MG) BY MOUTH DAILY   buPROPion 300 MG 24 hr tablet Commonly known as: Wellbutrin XL Take 1 tablet (300 mg total) by mouth daily.   Bystolic 5 MG tablet Generic drug: nebivolol TAKE 1 TABLET(5 MG) BY MOUTH DAILY   dexamethasone 4 MG tablet Commonly known as: DECADRON TAKE 3 TABLETS BY MOUTH 1 TIME A WEEK   dexamethasone 4 MG tablet Commonly known as: DECADRON Take 1 tablet (4 mg total) by mouth daily. Take daily x 2 days starting the  day after chemo. Take with breakfast.   escitalopram 20 MG tablet Commonly known as: LEXAPRO TAKE 1 TABLET(20 MG) BY MOUTH DAILY   famciclovir 250 MG tablet Commonly known as: FAMVIR TAKE 1 TABLET(250 MG) BY MOUTH DAILY   IRON PO Take by mouth.   montelukast 10 MG tablet Commonly known as: SINGULAIR TAKE 1 TABLET(10 MG) BY MOUTH AT BEDTIME. START TAKING ON 04/02/2020   potassium chloride SA 20 MEQ tablet Commonly known as: KLOR-CON TAKE 1 TABLET(20 MEQ) BY MOUTH DAILY   prochlorperazine 10 MG tablet Commonly known as: COMPAZINE TAKE 1 TABLET(10 MG) BY MOUTH EVERY 6 HOURS AS NEEDED FOR NAUSEA OR VOMITING   temazepam 7.5 MG capsule Commonly known as: RESTORIL TAKE 1 CAPSULE(7.5 MG) BY MOUTH AT BEDTIME AS NEEDED FOR SLEEP   Vitamin B-12 5000 MCG Tbdp Take by mouth.    Vitamin D 50 MCG (2000 UT) Caps Take 2,000 Units by mouth daily.       Allergies:  Allergies  Allergen Reactions  . Crestor [Rosuvastatin Calcium]     Causes liver functions to be elevated    Past Medical History, Surgical history, Social history, and Family History were reviewed and updated.  Review of Systems: Review of Systems  Constitutional: Negative.   HENT: Negative.   Eyes: Negative.   Respiratory: Negative.   Cardiovascular: Negative.   Gastrointestinal: Negative.   Genitourinary: Negative.   Musculoskeletal: Positive for back pain.  Skin: Negative.   Neurological: Negative.   Endo/Heme/Allergies: Negative.   Psychiatric/Behavioral: Negative.     Physical Exam:  weight is 111 lb 12.8 oz (50.7 kg). Debra Ruiz oral temperature is 98.2 F (36.8 C). Debra Ruiz blood pressure is 122/59 (abnormal) and Debra Ruiz pulse is 83. Debra Ruiz respiration is 16 and oxygen saturation is 100%.   Wt Readings from Last 3 Encounters:  05/24/20 111 lb 12.8 oz (50.7 kg)  04/26/20 110 lb 12 oz (50.2 kg)  04/25/20 109 lb (49.4 kg)    Physical Exam Vitals reviewed.  HENT:     Head: Normocephalic and atraumatic.  Eyes:     Pupils: Pupils are equal, round, and reactive to light.  Cardiovascular:     Rate and Rhythm: Normal rate and regular rhythm.     Heart sounds: Normal heart sounds.  Pulmonary:     Effort: Pulmonary effort is normal.     Breath sounds: Normal breath sounds.  Abdominal:     General: Bowel sounds are normal.     Palpations: Abdomen is soft.  Musculoskeletal:        General: No tenderness or deformity. Normal range of motion.     Cervical back: Normal range of motion.  Lymphadenopathy:     Cervical: No cervical adenopathy.  Skin:    General: Skin is warm and dry.     Findings: No erythema or rash.  Neurological:     Mental Status: She is alert and oriented to person, place, and time.  Psychiatric:        Behavior: Behavior normal.        Thought Content: Thought content  normal.        Judgment: Judgment normal.       Lab Results  Component Value Date   WBC 5.7 05/24/2020   HGB 14.1 05/24/2020   HCT 42.7 05/24/2020   MCV 97.3 05/24/2020   PLT 191 05/24/2020   Lab Results  Component Value Date   FERRITIN 128 10/06/2019   IRON 168 (H) 10/06/2019   TIBC 326 10/06/2019  UIBC 159 10/06/2019   IRONPCTSAT 51 10/06/2019   Lab Results  Component Value Date   RBC 4.39 05/24/2020   Lab Results  Component Value Date   KPAFRELGTCHN 48.8 (H) 04/26/2020   LAMBDASER 1.9 (L) 04/26/2020   KAPLAMBRATIO 25.68 (H) 04/26/2020   Lab Results  Component Value Date   IGGSERUM 973 04/26/2020   IGGSERUM 1,009 04/26/2020   IGA 10 (L) 04/26/2020   IGA 11 (L) 04/26/2020   IGMSERUM 35 04/26/2020   IGMSERUM 32 04/26/2020   Lab Results  Component Value Date   TOTALPROTELP 6.4 04/26/2020   ALBUMINELP 3.6 04/26/2020   A1GS 0.2 04/26/2020   A2GS 0.7 04/26/2020   BETS 1.0 04/26/2020   GAMS 0.9 04/26/2020   MSPIKE 0.7 (H) 04/26/2020   SPEI Comment 01/28/2018     Chemistry      Component Value Date/Time   NA 139 05/24/2020 0956   NA 148 (H) 07/16/2017 1128   NA 143 03/05/2017 1051   K 3.9 05/24/2020 0956   K 3.8 07/16/2017 1128   K 3.7 03/05/2017 1051   CL 103 05/24/2020 0956   CL 106 07/16/2017 1128   CO2 29 05/24/2020 0956   CO2 29 07/16/2017 1128   CO2 25 03/05/2017 1051   BUN 14 05/24/2020 0956   BUN 23 (H) 07/16/2017 1128   BUN 18.2 03/05/2017 1051   CREATININE 0.72 05/24/2020 0956   CREATININE 0.72 06/14/2019 0947   CREATININE 0.8 03/05/2017 1051      Component Value Date/Time   CALCIUM 9.7 05/24/2020 0956   CALCIUM 9.7 07/16/2017 1128   CALCIUM 8.9 03/05/2017 1051   ALKPHOS 69 05/24/2020 0956   ALKPHOS 71 07/16/2017 1128   ALKPHOS 67 03/05/2017 1051   AST 14 (L) 05/24/2020 0956   AST 15 03/05/2017 1051   ALT 11 05/24/2020 0956   ALT 23 07/16/2017 1128   ALT 17 03/05/2017 1051   BILITOT 0.4 05/24/2020 0956   BILITOT 0.55  03/05/2017 1051        Impression and Plan: Debra Ruiz is a very pleasant 74 yo caucasian female with IgG Kappa Myeloma.    For right now, we will have to see what Debra Ruiz myeloma studies look like.  I have to believe that they will be better.  We will go ahead with Debra Ruiz second cycle of treatment.  After this second 4-week cycle, then she will go to every 2 weeks.  This will certainly be much more amenable for Debra Ruiz.  She will get Debra Ruiz Delton See today.  She is still going to think about a colonoscopy.     [name], MD 11/3/202110:33 AM

## 2020-05-24 NOTE — Progress Notes (Signed)
Thanks

## 2020-05-25 LAB — IGG, IGA, IGM
IgA: 8 mg/dL — ABNORMAL LOW (ref 64–422)
IgG (Immunoglobin G), Serum: 972 mg/dL (ref 586–1602)
IgM (Immunoglobulin M), Srm: 32 mg/dL (ref 26–217)

## 2020-05-25 LAB — KAPPA/LAMBDA LIGHT CHAINS
Kappa free light chain: 68.2 mg/L — ABNORMAL HIGH (ref 3.3–19.4)
Kappa, lambda light chain ratio: 27.28 — ABNORMAL HIGH (ref 0.26–1.65)
Lambda free light chains: 2.5 mg/L — ABNORMAL LOW (ref 5.7–26.3)

## 2020-05-26 LAB — PROTEIN ELECTROPHORESIS, SERUM, WITH REFLEX
A/G Ratio: 1.2 (ref 0.7–1.7)
Albumin ELP: 3.6 g/dL (ref 2.9–4.4)
Alpha-1-Globulin: 0.3 g/dL (ref 0.0–0.4)
Alpha-2-Globulin: 0.8 g/dL (ref 0.4–1.0)
Beta Globulin: 1 g/dL (ref 0.7–1.3)
Gamma Globulin: 0.9 g/dL (ref 0.4–1.8)
Globulin, Total: 3 g/dL (ref 2.2–3.9)
M-Spike, %: 0.6 g/dL — ABNORMAL HIGH
SPEP Interpretation: 0
Total Protein ELP: 6.6 g/dL (ref 6.0–8.5)

## 2020-05-26 LAB — IMMUNOFIXATION REFLEX, SERUM
IgA: 8 mg/dL — ABNORMAL LOW (ref 64–422)
IgG (Immunoglobin G), Serum: 982 mg/dL (ref 586–1602)
IgM (Immunoglobulin M), Srm: 34 mg/dL (ref 26–217)

## 2020-05-29 ENCOUNTER — Other Ambulatory Visit: Payer: Self-pay | Admitting: Hematology & Oncology

## 2020-05-30 ENCOUNTER — Other Ambulatory Visit: Payer: Self-pay | Admitting: *Deleted

## 2020-05-30 DIAGNOSIS — C9000 Multiple myeloma not having achieved remission: Secondary | ICD-10-CM

## 2020-05-31 ENCOUNTER — Inpatient Hospital Stay: Payer: PPO

## 2020-05-31 ENCOUNTER — Other Ambulatory Visit: Payer: Self-pay | Admitting: *Deleted

## 2020-05-31 ENCOUNTER — Other Ambulatory Visit: Payer: Self-pay

## 2020-05-31 VITALS — BP 110/90 | HR 88 | Temp 97.5°F | Resp 17

## 2020-05-31 DIAGNOSIS — C9000 Multiple myeloma not having achieved remission: Secondary | ICD-10-CM

## 2020-05-31 DIAGNOSIS — Z5112 Encounter for antineoplastic immunotherapy: Secondary | ICD-10-CM | POA: Diagnosis not present

## 2020-05-31 LAB — CBC WITH DIFFERENTIAL (CANCER CENTER ONLY)
Abs Immature Granulocytes: 0.01 10*3/uL (ref 0.00–0.07)
Basophils Absolute: 0 10*3/uL (ref 0.0–0.1)
Basophils Relative: 0 %
Eosinophils Absolute: 0 10*3/uL (ref 0.0–0.5)
Eosinophils Relative: 1 %
HCT: 40.5 % (ref 36.0–46.0)
Hemoglobin: 13.2 g/dL (ref 12.0–15.0)
Immature Granulocytes: 0 %
Lymphocytes Relative: 16 %
Lymphs Abs: 0.9 10*3/uL (ref 0.7–4.0)
MCH: 32.3 pg (ref 26.0–34.0)
MCHC: 32.6 g/dL (ref 30.0–36.0)
MCV: 99 fL (ref 80.0–100.0)
Monocytes Absolute: 0.5 10*3/uL (ref 0.1–1.0)
Monocytes Relative: 10 %
Neutro Abs: 4.1 10*3/uL (ref 1.7–7.7)
Neutrophils Relative %: 73 %
Platelet Count: 195 10*3/uL (ref 150–400)
RBC: 4.09 MIL/uL (ref 3.87–5.11)
RDW: 14.2 % (ref 11.5–15.5)
WBC Count: 5.5 10*3/uL (ref 4.0–10.5)
nRBC: 0 % (ref 0.0–0.2)

## 2020-05-31 LAB — CMP (CANCER CENTER ONLY)
ALT: 8 U/L (ref 0–44)
AST: 12 U/L — ABNORMAL LOW (ref 15–41)
Albumin: 3.8 g/dL (ref 3.5–5.0)
Alkaline Phosphatase: 56 U/L (ref 38–126)
Anion gap: 7 (ref 5–15)
BUN: 11 mg/dL (ref 8–23)
CO2: 27 mmol/L (ref 22–32)
Calcium: 8.8 mg/dL — ABNORMAL LOW (ref 8.9–10.3)
Chloride: 105 mmol/L (ref 98–111)
Creatinine: 0.74 mg/dL (ref 0.44–1.00)
GFR, Estimated: >60 mL/min (ref >60)
Glucose, Bld: 149 mg/dL — ABNORMAL HIGH (ref 70–99)
Potassium: 4 mmol/L (ref 3.5–5.1)
Sodium: 139 mmol/L (ref 135–145)
Total Bilirubin: 0.5 mg/dL (ref 0.3–1.2)
Total Protein: 6.3 g/dL — ABNORMAL LOW (ref 6.5–8.1)

## 2020-05-31 MED ORDER — DEXAMETHASONE 4 MG PO TABS
ORAL_TABLET | ORAL | Status: AC
  Filled 2020-05-31: qty 2

## 2020-05-31 MED ORDER — DEXAMETHASONE 4 MG PO TABS
8.0000 mg | ORAL_TABLET | Freq: Once | ORAL | Status: AC
  Administered 2020-05-31: 8 mg via ORAL

## 2020-05-31 MED ORDER — DIPHENHYDRAMINE HCL 25 MG PO CAPS
25.0000 mg | ORAL_CAPSULE | Freq: Once | ORAL | Status: AC
  Administered 2020-05-31: 25 mg via ORAL

## 2020-05-31 MED ORDER — DARATUMUMAB-HYALURONIDASE-FIHJ 1800-30000 MG-UT/15ML ~~LOC~~ SOLN
1800.0000 mg | Freq: Once | SUBCUTANEOUS | Status: AC
  Administered 2020-05-31: 1800 mg via SUBCUTANEOUS
  Filled 2020-05-31: qty 15

## 2020-05-31 MED ORDER — ACETAMINOPHEN 325 MG PO TABS
650.0000 mg | ORAL_TABLET | Freq: Once | ORAL | Status: AC
  Administered 2020-05-31: 650 mg via ORAL

## 2020-05-31 MED ORDER — ACETAMINOPHEN 325 MG PO TABS
ORAL_TABLET | ORAL | Status: AC
  Filled 2020-05-31: qty 2

## 2020-05-31 MED ORDER — DIPHENHYDRAMINE HCL 25 MG PO CAPS
ORAL_CAPSULE | ORAL | Status: AC
  Filled 2020-05-31: qty 1

## 2020-05-31 NOTE — Progress Notes (Signed)
Pt discharged in no apparent distress. Pt left ambulatory without assistance. Pt aware of discharge instructions and verbalized understanding and had no further questions.  

## 2020-05-31 NOTE — Patient Instructions (Addendum)
[place] Discharge Instructions for Patients Receiving Chemotherapy  Today you received the following chemotherapy agents Darzalex Faspro  To help prevent nausea and vomiting after your treatment, we encourage you to take your nausea medication as prescribed by MD.   If you develop nausea and vomiting that is not controlled by your nausea medication, call the clinic.   BELOW ARE SYMPTOMS THAT SHOULD BE REPORTED IMMEDIATELY:  *FEVER GREATER THAN 100.5 F  *CHILLS WITH OR WITHOUT FEVER  NAUSEA AND VOMITING THAT IS NOT CONTROLLED WITH YOUR NAUSEA MEDICATION  *UNUSUAL SHORTNESS OF BREATH  *UNUSUAL BRUISING OR BLEEDING  TENDERNESS IN MOUTH AND THROAT WITH OR WITHOUT PRESENCE OF ULCERS  *URINARY PROBLEMS  *BOWEL PROBLEMS  UNUSUAL RASH Items with * indicate a potential emergency and should be followed up as soon as possible.  Feel free to call the clinic should you have any questions or concerns. The clinic phone number is [phone].  Please show the [place] at check-in to the Emergency Department and triage nurse.  Daratumumab injection What is this medicine? DARATUMUMAB (dar a toom ue mab) is a monoclonal antibody. It is used to treat multiple myeloma. This medicine may be used for other purposes; ask your health care provider or pharmacist if you have questions. COMMON BRAND NAME(S): DARZALEX What should I tell my health care provider before I take this medicine? They need to know if you have any of these conditions: infection (especially a virus infection such as chickenpox, herpes, or hepatitis B virus) lung or breathing disease an unusual or allergic reaction to daratumumab, other medicines, foods, dyes, or preservatives pregnant or trying to get pregnant breast-feeding How should I use this medicine? This medicine is for infusion into a vein. It is given by a health care professional in a hospital or clinic setting. Talk to your  pediatrician regarding the use of this medicine in children. Special care may be needed. Overdosage: If you think you have taken too much of this medicine contact a poison control center or emergency room at once. NOTE: This medicine is only for you. Do not share this medicine with others. What if I miss a dose? Keep appointments for follow-up doses as directed. It is important not to miss your dose. Call your doctor or health care professional if you are unable to keep an appointment. What may interact with this medicine? Interactions have not been studied. This list may not describe all possible interactions. Give your health care provider a list of all the medicines, herbs, non-prescription drugs, or dietary supplements you use. Also tell them if you smoke, drink alcohol, or use illegal drugs. Some items may interact with your medicine. What should I watch for while using this medicine? This drug may make you feel generally unwell. Report any side effects. Continue your course of treatment even though you feel ill unless your doctor tells you to stop. This medicine can cause serious allergic reactions. To reduce your risk you may need to take medicine before treatment with this medicine. Take your medicine as directed. This medicine can affect the results of blood tests to match your blood type. These changes can last for up to 6 months after the final dose. Your healthcare provider will do blood tests to match your blood type before you start treatment. Tell all of your healthcare providers that you are being treated with this medicine before receiving a blood transfusion. This medicine can affect the results of some tests used to  determine treatment response; extra tests may be needed to evaluate response. Do not become pregnant while taking this medicine or for 3 months after stopping it. Women should inform their doctor if they wish to become pregnant or think they might be pregnant. There is a  potential for serious side effects to an unborn child. Talk to your health care professional or pharmacist for more information. What side effects may I notice from receiving this medicine? Side effects that you should report to your doctor or health care professional as soon as possible: allergic reactions like skin rash, itching or hives, swelling of the face, lips, or tongue breathing problems chills cough dizziness feeling faint or lightheaded headache low blood counts - this medicine may decrease the number of white blood cells, red blood cells and platelets. You may be at increased risk for infections and bleeding. nausea, vomiting shortness of breath signs of decreased platelets or bleeding - bruising, pinpoint red spots on the skin, black, tarry stools, blood in the urine signs of decreased red blood cells - unusually weak or tired, feeling faint or lightheaded, falls signs of infection - fever or chills, cough, sore throat, pain or difficulty passing urine signs and symptoms of liver injury like dark yellow or brown urine; general ill feeling or flu-like symptoms; light-colored stools; loss of appetite; right upper belly pain; unusually weak or tired; yellowing of the eyes or skin Side effects that usually do not require medical attention (report to your doctor or health care professional if they continue or are bothersome): back pain constipation diarrhea joint pain muscle cramps pain, tingling, numbness in the hands or feet swelling of the ankles, feet, hands tiredness trouble sleeping This list may not describe all possible side effects. Call your doctor for medical advice about side effects. You may report side effects to FDA at 1-800-FDA-1088. Where should I keep my medicine? This drug is given in a hospital or clinic and will not be stored at home. NOTE: This sheet is a summary. It may not cover all possible information. If you have questions about this medicine, talk to  your doctor, pharmacist, or health care provider.  2020 Elsevier/Gold Standard (2019-03-16 18:10:54)

## 2020-06-01 LAB — KAPPA/LAMBDA LIGHT CHAINS
Kappa free light chain: 77.6 mg/L — ABNORMAL HIGH (ref 3.3–19.4)
Kappa, lambda light chain ratio: 35.27 — ABNORMAL HIGH (ref 0.26–1.65)
Lambda free light chains: 2.2 mg/L — ABNORMAL LOW (ref 5.7–26.3)

## 2020-06-05 LAB — JAK2 (INCLUDING V617F AND EXON 12), MPL,& CALR-NEXT GEN SEQ

## 2020-06-13 ENCOUNTER — Other Ambulatory Visit: Payer: Self-pay | Admitting: *Deleted

## 2020-06-13 DIAGNOSIS — C9000 Multiple myeloma not having achieved remission: Secondary | ICD-10-CM

## 2020-06-14 ENCOUNTER — Inpatient Hospital Stay: Payer: PPO

## 2020-06-14 ENCOUNTER — Other Ambulatory Visit: Payer: Self-pay

## 2020-06-14 VITALS — BP 133/79 | HR 90 | Temp 97.6°F | Resp 16

## 2020-06-14 DIAGNOSIS — C9000 Multiple myeloma not having achieved remission: Secondary | ICD-10-CM

## 2020-06-14 DIAGNOSIS — Z5112 Encounter for antineoplastic immunotherapy: Secondary | ICD-10-CM | POA: Diagnosis not present

## 2020-06-14 LAB — CBC WITH DIFFERENTIAL (CANCER CENTER ONLY)
Abs Immature Granulocytes: 0.03 10*3/uL (ref 0.00–0.07)
Basophils Absolute: 0 10*3/uL (ref 0.0–0.1)
Basophils Relative: 0 %
Eosinophils Absolute: 0.1 10*3/uL (ref 0.0–0.5)
Eosinophils Relative: 1 %
HCT: 43.1 % (ref 36.0–46.0)
Hemoglobin: 14.3 g/dL (ref 12.0–15.0)
Immature Granulocytes: 1 %
Lymphocytes Relative: 20 %
Lymphs Abs: 1.2 10*3/uL (ref 0.7–4.0)
MCH: 32.6 pg (ref 26.0–34.0)
MCHC: 33.2 g/dL (ref 30.0–36.0)
MCV: 98.2 fL (ref 80.0–100.0)
Monocytes Absolute: 0.5 10*3/uL (ref 0.1–1.0)
Monocytes Relative: 9 %
Neutro Abs: 4.4 10*3/uL (ref 1.7–7.7)
Neutrophils Relative %: 69 %
Platelet Count: 241 10*3/uL (ref 150–400)
RBC: 4.39 MIL/uL (ref 3.87–5.11)
RDW: 13.7 % (ref 11.5–15.5)
WBC Count: 6.2 10*3/uL (ref 4.0–10.5)
nRBC: 0 % (ref 0.0–0.2)

## 2020-06-14 LAB — CMP (CANCER CENTER ONLY)
ALT: 10 U/L (ref 0–44)
AST: 16 U/L (ref 15–41)
Albumin: 4.2 g/dL (ref 3.5–5.0)
Alkaline Phosphatase: 64 U/L (ref 38–126)
Anion gap: 7 (ref 5–15)
BUN: 13 mg/dL (ref 8–23)
CO2: 29 mmol/L (ref 22–32)
Calcium: 9.6 mg/dL (ref 8.9–10.3)
Chloride: 103 mmol/L (ref 98–111)
Creatinine: 0.72 mg/dL (ref 0.44–1.00)
GFR, Estimated: >60 mL/min (ref >60)
Glucose, Bld: 133 mg/dL — ABNORMAL HIGH (ref 70–99)
Potassium: 4 mmol/L (ref 3.5–5.1)
Sodium: 139 mmol/L (ref 135–145)
Total Bilirubin: 0.4 mg/dL (ref 0.3–1.2)
Total Protein: 7.1 g/dL (ref 6.5–8.1)

## 2020-06-14 MED ORDER — DIPHENHYDRAMINE HCL 25 MG PO CAPS
25.0000 mg | ORAL_CAPSULE | Freq: Once | ORAL | Status: AC
  Administered 2020-06-14: 25 mg via ORAL

## 2020-06-14 MED ORDER — DEXAMETHASONE 4 MG PO TABS
ORAL_TABLET | ORAL | Status: AC
  Filled 2020-06-14: qty 2

## 2020-06-14 MED ORDER — DEXAMETHASONE 4 MG PO TABS
8.0000 mg | ORAL_TABLET | Freq: Once | ORAL | Status: AC
  Administered 2020-06-14: 8 mg via ORAL

## 2020-06-14 MED ORDER — DIPHENHYDRAMINE HCL 25 MG PO CAPS
ORAL_CAPSULE | ORAL | Status: AC
  Filled 2020-06-14: qty 1

## 2020-06-14 MED ORDER — ACETAMINOPHEN 325 MG PO TABS
ORAL_TABLET | ORAL | Status: AC
  Filled 2020-06-14: qty 2

## 2020-06-14 MED ORDER — DARATUMUMAB-HYALURONIDASE-FIHJ 1800-30000 MG-UT/15ML ~~LOC~~ SOLN
1800.0000 mg | Freq: Once | SUBCUTANEOUS | Status: AC
  Administered 2020-06-14: 1800 mg via SUBCUTANEOUS
  Filled 2020-06-14: qty 15

## 2020-06-14 MED ORDER — ACETAMINOPHEN 325 MG PO TABS
650.0000 mg | ORAL_TABLET | Freq: Once | ORAL | Status: AC
  Administered 2020-06-14: 650 mg via ORAL

## 2020-06-14 NOTE — Patient Instructions (Signed)
Daratumumab injection What is this medicine? DARATUMUMAB (dar a toom ue mab) is a monoclonal antibody. It is used to treat multiple myeloma. This medicine may be used for other purposes; ask your health care provider or pharmacist if you have questions. COMMON BRAND NAME(S): DARZALEX What should I tell my health care provider before I take this medicine? They need to know if you have any of these conditions:  infection (especially a virus infection such as chickenpox, herpes, or hepatitis B virus)  lung or breathing disease  an unusual or allergic reaction to daratumumab, other medicines, foods, dyes, or preservatives  pregnant or trying to get pregnant  breast-feeding How should I use this medicine? This medicine is for infusion into a vein. It is given by a health care professional in a hospital or clinic setting. Talk to your pediatrician regarding the use of this medicine in children. Special care may be needed. Overdosage: If you think you have taken too much of this medicine contact a poison control center or emergency room at once. NOTE: This medicine is only for you. Do not share this medicine with others. What if I miss a dose? Keep appointments for follow-up doses as directed. It is important not to miss your dose. Call your doctor or health care professional if you are unable to keep an appointment. What may interact with this medicine? Interactions have not been studied. This list may not describe all possible interactions. Give your health care provider a list of all the medicines, herbs, non-prescription drugs, or dietary supplements you use. Also tell them if you smoke, drink alcohol, or use illegal drugs. Some items may interact with your medicine. What should I watch for while using this medicine? This drug may make you feel generally unwell. Report any side effects. Continue your course of treatment even though you feel ill unless your doctor tells you to stop. This  medicine can cause serious allergic reactions. To reduce your risk you may need to take medicine before treatment with this medicine. Take your medicine as directed. This medicine can affect the results of blood tests to match your blood type. These changes can last for up to 6 months after the final dose. Your healthcare provider will do blood tests to match your blood type before you start treatment. Tell all of your healthcare providers that you are being treated with this medicine before receiving a blood transfusion. This medicine can affect the results of some tests used to determine treatment response; extra tests may be needed to evaluate response. Do not become pregnant while taking this medicine or for 3 months after stopping it. Women should inform their doctor if they wish to become pregnant or think they might be pregnant. There is a potential for serious side effects to an unborn child. Talk to your health care professional or pharmacist for more information. What side effects may I notice from receiving this medicine? Side effects that you should report to your doctor or health care professional as soon as possible:  allergic reactions like skin rash, itching or hives, swelling of the face, lips, or tongue  breathing problems  chills  cough  dizziness  feeling faint or lightheaded  headache  low blood counts - this medicine may decrease the number of white blood cells, red blood cells and platelets. You may be at increased risk for infections and bleeding.  nausea, vomiting  shortness of breath  signs of decreased platelets or bleeding - bruising, pinpoint red spots on  the skin, black, tarry stools, blood in the urine  signs of decreased red blood cells - unusually weak or tired, feeling faint or lightheaded, falls  signs of infection - fever or chills, cough, sore throat, pain or difficulty passing urine  signs and symptoms of liver injury like dark yellow or brown  urine; general ill feeling or flu-like symptoms; light-colored stools; loss of appetite; right upper belly pain; unusually weak or tired; yellowing of the eyes or skin Side effects that usually do not require medical attention (report to your doctor or health care professional if they continue or are bothersome):  back pain  constipation  diarrhea  joint pain  muscle cramps  pain, tingling, numbness in the hands or feet  swelling of the ankles, feet, hands  tiredness  trouble sleeping This list may not describe all possible side effects. Call your doctor for medical advice about side effects. You may report side effects to FDA at 1-800-FDA-1088. Where should I keep my medicine? This drug is given in a hospital or clinic and will not be stored at home. NOTE: This sheet is a summary. It may not cover all possible information. If you have questions about this medicine, talk to your doctor, pharmacist, or health care provider.  2020 Elsevier/Gold Standard (2019-03-16 18:10:54)  

## 2020-06-14 NOTE — Progress Notes (Signed)
Pt discharged in no apparent distress. Pt left ambulatory without assistance. Pt aware of discharge instructions and verbalized understanding and had no further questions.  

## 2020-06-20 ENCOUNTER — Other Ambulatory Visit: Payer: PPO | Admitting: Internal Medicine

## 2020-06-20 ENCOUNTER — Other Ambulatory Visit: Payer: Self-pay

## 2020-06-20 DIAGNOSIS — E7800 Pure hypercholesterolemia, unspecified: Secondary | ICD-10-CM | POA: Diagnosis not present

## 2020-06-20 DIAGNOSIS — F419 Anxiety disorder, unspecified: Secondary | ICD-10-CM

## 2020-06-20 DIAGNOSIS — C9002 Multiple myeloma in relapse: Secondary | ICD-10-CM | POA: Diagnosis not present

## 2020-06-20 DIAGNOSIS — Z0000 Encounter for general adult medical examination without abnormal findings: Secondary | ICD-10-CM

## 2020-06-20 DIAGNOSIS — F32A Depression, unspecified: Secondary | ICD-10-CM | POA: Diagnosis not present

## 2020-06-20 DIAGNOSIS — E7849 Other hyperlipidemia: Secondary | ICD-10-CM

## 2020-06-20 DIAGNOSIS — Z981 Arthrodesis status: Secondary | ICD-10-CM

## 2020-06-20 DIAGNOSIS — I10 Essential (primary) hypertension: Secondary | ICD-10-CM | POA: Diagnosis not present

## 2020-06-21 LAB — CBC WITH DIFFERENTIAL/PLATELET
Absolute Monocytes: 555 cells/uL (ref 200–950)
Basophils Absolute: 12 cells/uL (ref 0–200)
Basophils Relative: 0.2 %
Eosinophils Absolute: 100 cells/uL (ref 15–500)
Eosinophils Relative: 1.7 %
HCT: 43.2 % (ref 35.0–45.0)
Hemoglobin: 14.4 g/dL (ref 11.7–15.5)
Lymphs Abs: 1339 cells/uL (ref 850–3900)
MCH: 32.2 pg (ref 27.0–33.0)
MCHC: 33.3 g/dL (ref 32.0–36.0)
MCV: 96.6 fL (ref 80.0–100.0)
MPV: 9.7 fL (ref 7.5–12.5)
Monocytes Relative: 9.4 %
Neutro Abs: 3894 cells/uL (ref 1500–7800)
Neutrophils Relative %: 66 %
Platelets: 251 10*3/uL (ref 140–400)
RBC: 4.47 10*6/uL (ref 3.80–5.10)
RDW: 13.7 % (ref 11.0–15.0)
Total Lymphocyte: 22.7 %
WBC: 5.9 10*3/uL (ref 3.8–10.8)

## 2020-06-21 LAB — COMPLETE METABOLIC PANEL WITH GFR
AG Ratio: 1.5 (calc) (ref 1.0–2.5)
ALT: 12 U/L (ref 6–29)
AST: 15 U/L (ref 10–35)
Albumin: 4 g/dL (ref 3.6–5.1)
Alkaline phosphatase (APISO): 69 U/L (ref 37–153)
BUN: 12 mg/dL (ref 7–25)
CO2: 25 mmol/L (ref 20–32)
Calcium: 9.3 mg/dL (ref 8.6–10.4)
Chloride: 106 mmol/L (ref 98–110)
Creat: 0.75 mg/dL (ref 0.60–0.93)
GFR, Est African American: 91 mL/min/{1.73_m2} (ref >=60)
GFR, Est Non African American: 79 mL/min/{1.73_m2} (ref >=60)
Globulin: 2.6 g/dL (calc) (ref 1.9–3.7)
Glucose, Bld: 92 mg/dL (ref 65–99)
Potassium: 4.4 mmol/L (ref 3.5–5.3)
Sodium: 141 mmol/L (ref 135–146)
Total Bilirubin: 0.5 mg/dL (ref 0.2–1.2)
Total Protein: 6.6 g/dL (ref 6.1–8.1)

## 2020-06-21 LAB — LIPID PANEL
Cholesterol: 187 mg/dL (ref <200)
HDL: 66 mg/dL (ref >=50)
LDL Cholesterol (Calc): 98 mg/dL (calc)
Non-HDL Cholesterol (Calc): 121 mg/dL (calc) (ref <130)
Total CHOL/HDL Ratio: 2.8 (calc) (ref <5.0)
Triglycerides: 130 mg/dL (ref <150)

## 2020-06-21 LAB — TSH: TSH: 1.96 mIU/L (ref 0.40–4.50)

## 2020-06-22 ENCOUNTER — Encounter: Payer: Self-pay | Admitting: Internal Medicine

## 2020-06-22 ENCOUNTER — Other Ambulatory Visit: Payer: Self-pay

## 2020-06-22 ENCOUNTER — Ambulatory Visit (INDEPENDENT_AMBULATORY_CARE_PROVIDER_SITE_OTHER): Payer: PPO | Admitting: Internal Medicine

## 2020-06-22 VITALS — BP 120/70 | HR 89 | Ht <= 58 in | Wt 110.0 lb

## 2020-06-22 DIAGNOSIS — C9000 Multiple myeloma not having achieved remission: Secondary | ICD-10-CM

## 2020-06-22 DIAGNOSIS — Z981 Arthrodesis status: Secondary | ICD-10-CM

## 2020-06-22 DIAGNOSIS — Z0000 Encounter for general adult medical examination without abnormal findings: Secondary | ICD-10-CM

## 2020-06-22 DIAGNOSIS — R54 Age-related physical debility: Secondary | ICD-10-CM

## 2020-06-22 DIAGNOSIS — I10 Essential (primary) hypertension: Secondary | ICD-10-CM | POA: Diagnosis not present

## 2020-06-22 DIAGNOSIS — E7800 Pure hypercholesterolemia, unspecified: Secondary | ICD-10-CM | POA: Diagnosis not present

## 2020-06-22 DIAGNOSIS — F32A Depression, unspecified: Secondary | ICD-10-CM | POA: Diagnosis not present

## 2020-06-22 DIAGNOSIS — F419 Anxiety disorder, unspecified: Secondary | ICD-10-CM

## 2020-06-22 LAB — POCT URINALYSIS DIPSTICK
Appearance: NEGATIVE
Bilirubin, UA: NEGATIVE
Blood, UA: NEGATIVE
Glucose, UA: NEGATIVE
Ketones, UA: NEGATIVE
Leukocytes, UA: NEGATIVE
Nitrite, UA: NEGATIVE
Odor: NEGATIVE
Protein, UA: NEGATIVE
Spec Grav, UA: 1.01 (ref 1.010–1.025)
Urobilinogen, UA: 0.2 E.U./dL
pH, UA: 6.5 (ref 5.0–8.0)

## 2020-06-22 NOTE — Progress Notes (Signed)
Subjective:    Patient ID: Debra Ruiz, female    DOB: 09-Dec-1945, 74 y.o.   MRN: [id number]  HPI 74 year old Female seen for Medicare wellness, health maintenance exam and evaluation of medical issues.  Patient has a history of multiple myeloma and is under the care of Dr. Marin Olp.  She has not achieved remission and on December 30 started carfilzomib injection.  Started Faspro September 2021 and this is being continued.  Is getting Delton See every 3 months and next dose is due February 2022.  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 Kathrin Penner who did an extensive evaluation.  She was found not to have a brain tumor.  This has improved.  In August 2017 she was doing some yard work and strained her back.  Was treated conservatively with meloxicam and Flexeril.  In October 2017 she was seen with left-sided sciatica with history of spondylosis L3-L4 and moderately severe canal stenosis based on MRI done in 2011 with anterior listhesis.  [place] going to [place] bathroom late at night September 2017.  Was complaining of pain in left buttock going down to left leg and pain in the quadriceps muscle.  She had an MRI showing a moderate compression fracture of T12.  This appeared to be subacute and a benign fracture.  She was also seen in December 2017 with back pain after going into an attic the previous day.  Had not done any heavy lifting but was in a lot of pain.  Was thought to have musculoskeletal pain and was treated with narcotic pain medication and a prednisone Dosepak.  Patient was subsequently seen by Dr. Lynann Bologna at [facility] and was found to have multifocal marrow space lesions consistent with multiple myeloma.  She also had multifactorial lumbar stenosis.  She was referred to Dr. Marin Olp who diagnosed her IgG multiple myeloma.  She did have T11-T12 kyphoplasty done February 2018.  Was treated with chemotherapy and radiation.  Had surgery by  Dr. Lynann Bologna in October 2020 involving a C3-C7 posterior cervical decompression fusion with instrumentation and allograft.  She is now able to drive.  She has to rest frequently during the day.  Is able to go out to eat and get her nails done.  Unable to tolerate Crestor for hyperlipidemia according to old records when she was under the care of Dr. Sharene Butters.  Apparently had some increased liver functions on Crestor.  History of Herpes simplex type I.  Tubal ligation 1977.  History of hypertension, hyperlipidemia, anxiety, depression, GE reflux, insomnia.  Social history: She is married.  Previously was a smoker but currently a non-smoker.  She came to [facility] in [place].  Previously lived in [place].  Previously worked for an IT consultant.  Has been married 3 times.  No children.  Has a twin sister.  Family history: Father died at age 74 of an MI with hypertension.  Mother died at age 40 of a stroke with history of hypertension.  Brother with history of brain tumor, cardiac defibrillator.  Twin sister lives in [place].  Sister with history of hypertension and hyperlipidemia.  There is a strong family history of Alzheimer's disease.          Review of Systems does not have a lot of energy.  Tends to rest in the late morning.     Objective:   Physical Exam Blood pressure 120/70 pulse 89 regular pulse oximetry 97% weight  110 pounds height 4 feet 10 inches BMI 22.99  Skin is warm and dry.  Nodes none.  TMs clear.  Neck is supple without JVD thyromegaly or carotid bruits.  Chest is clear to auscultation.  Cardiac exam: Regular rate and rhythm normal S1 and S2 without murmurs or gallops.  Abdomen soft nondistended without hepatosplenomegaly masses or tenderness.  Pelvic exam: Pap deferred.  Bimanual normal.  Extremities: No lower extremity edema.  Neurological exam: No gross focal deficits on brief neurological exam.  Affect thought and judgment appear to be normal.        Assessment & Plan:  Multiple myeloma currently being treated by Dr. Marin Olp and is followed closely by him  History of essential hypertension stable on amlodipine and Bystolic  History of anxiety treated with Xanax up to twice daily  Hyperlipidemia treated with statin and lipid panel is normal.  History of depression treated with Wellbutrin and Lexapro  Insomnia treated with Restoril by Dr. Marin Olp  Plan: She is getting excellent care with Dr. Marin Olp.  Recommend 1 year follow-up.  COVID precautions discussed.  She has had 3 COVID-19 vaccines.  Has had both Prevnar and pneumococcal 23 vaccines.  Tetanus immunization is up-to-date.  Received flu vaccine in September.  Subjective:   Patient presents for Medicare Annual/Subsequent preventive examination.  Review Past Medical/Family/Social: See above  Risk Factors  Current exercise habits: Does not feel much like exercising Dietary issues discussed: Low-fat low carbohydrate  Cardiac risk factors: Hyperlipidemia  Depression Screen  (Note: if answer to either of the following is "Yes", a more complete depression screening is indicated)   Over the past two weeks, have you felt down, depressed or hopeless? yes Over the past two weeks, have you felt little interest or pleasure in doing things? yes Have you lost interest or pleasure in daily life? No Do you often feel hopeless? No Do you cry easily over simple problems? No   Activities of Daily Living  In your present state of health, do you have any difficulty performing the following activities?:   Driving? No  Managing money? No  Feeding yourself? No  Getting from bed to chair? No  Climbing a flight of stairs? No  Preparing food and eating?: No  Bathing or showering? No  Getting dressed: No  Getting to the toilet? No  Using the toilet:No  Moving around from place to place: No  In the past year have you fallen or had a near fall?:No  Are you sexually active? No  Do you  have more than one partner? No   Hearing Difficulties:  Do you often ask people to speak up or repeat themselves?  Yes Do you experience ringing or noises in your ears? No  Do you have difficulty understanding soft or whispered voices?  Yes do you feel that you have a problem with memory?  Yes Do you often misplace items? No    Home Safety:  Do you have a smoke alarm at your residence? Yes Do you have grab bars in the bathroom?  Yes Do you have throw rugs in your house?  None   Cognitive Testing  Alert? Yes Normal Appearance?Yes  Oriented to person? Yes Place? Yes  Time? Yes  Recall of three objects?  Not tested Can perform simple calculations? Yes  Displays appropriate judgment?Yes  Can read the correct time from a watch face?Yes   List the Names of Other Physician/Practitioners you currently use:  See referral list for the physicians patient  is currently seeing.  Dr. Marin Olp  Dr. Lynann Bologna  Review of Systems: See above   Objective:     General appearance: Appears stated age and a bit frail Head: Normocephalic, without obvious abnormality, atraumatic  Eyes: conj clear, EOMi PEERLA  Ears: normal TM's and external ear canals both ears  Nose: Nares normal. Septum midline. Mucosa normal. No drainage or sinus tenderness.  Throat: lips, mucosa, and tongue normal; teeth and gums normal  Neck: no adenopathy, no carotid bruit, no JVD, supple, symmetrical, trachea midline and thyroid not enlarged, symmetric, no tenderness/mass/nodules  No CVA tenderness.  Lungs: clear to auscultation bilaterally  Breasts: normal appearance, no masses or tenderness Heart: regular rate and rhythm, S1, S2 normal, no murmur, click, rub or gallop  Abdomen: soft, non-tender; bowel sounds normal; no masses, no organomegaly  Musculoskeletal: ROM normal in all joints, no crepitus, no deformity, Normal muscle strengthen. Back  is symmetric, no curvature. Skin: Skin color, texture, turgor normal. No  rashes or lesions  Lymph nodes: Cervical, supraclavicular, and axillary nodes normal.  Neurologic: CN 2 -12 Normal, Normal symmetric reflexes. Normal coordination and gait  Psych: Alert & Oriented x 3, Mood appear stable.    Assessment:    Annual wellness medicare exam   Plan:    During the course of the visit the patient was educated and counseled about appropriate screening and preventive services including:   Had mammogram January 2021  Immunizations are up-to-date  Had Cologuard in 2017 and result was negative.  Has declined colonoscopy.  Does not want to proceed with another Cologuard test right now.   Patient Instructions (the written plan) was given to the patient.  Medicare Attestation  I have personally reviewed:  The patient's medical and social history  Their use of alcohol, tobacco or illicit drugs  Their current medications and supplements  The patient's functional ability including ADLs,fall risks, home safety risks, cognitive, and hearing and visual impairment  Diet and physical activities  Evidence for depression or mood disorders  The patient's weight, height, BMI, and visual acuity have been recorded in the chart. I have made referrals, counseling, and provided education to the patient based on review of the above and I have provided the patient with a written personalized care plan for preventive services.       History of depression treated with Wellbutrin

## 2020-06-23 ENCOUNTER — Telehealth: Payer: Self-pay

## 2020-06-23 NOTE — Telephone Encounter (Signed)
Pt called in to r/s her 07/12/20 appts to 07/10/20 as she will be out of town,,,,AOM

## 2020-06-28 ENCOUNTER — Inpatient Hospital Stay (HOSPITAL_BASED_OUTPATIENT_CLINIC_OR_DEPARTMENT_OTHER): Payer: PPO | Admitting: Hematology & Oncology

## 2020-06-28 ENCOUNTER — Other Ambulatory Visit: Payer: Self-pay

## 2020-06-28 ENCOUNTER — Inpatient Hospital Stay: Payer: PPO

## 2020-06-28 ENCOUNTER — Encounter: Payer: Self-pay | Admitting: Hematology & Oncology

## 2020-06-28 ENCOUNTER — Inpatient Hospital Stay: Payer: PPO | Attending: Hematology & Oncology

## 2020-06-28 VITALS — BP 134/67 | HR 70 | Temp 97.8°F | Resp 17 | Wt 108.1 lb

## 2020-06-28 DIAGNOSIS — D509 Iron deficiency anemia, unspecified: Secondary | ICD-10-CM | POA: Diagnosis not present

## 2020-06-28 DIAGNOSIS — Z5112 Encounter for antineoplastic immunotherapy: Secondary | ICD-10-CM | POA: Diagnosis not present

## 2020-06-28 DIAGNOSIS — C9000 Multiple myeloma not having achieved remission: Secondary | ICD-10-CM

## 2020-06-28 DIAGNOSIS — Z79899 Other long term (current) drug therapy: Secondary | ICD-10-CM | POA: Insufficient documentation

## 2020-06-28 LAB — CMP (CANCER CENTER ONLY)
ALT: 12 U/L (ref 0–44)
AST: 12 U/L — ABNORMAL LOW (ref 15–41)
Albumin: 4.3 g/dL (ref 3.5–5.0)
Alkaline Phosphatase: 57 U/L (ref 38–126)
Anion gap: 8 (ref 5–15)
BUN: 19 mg/dL (ref 8–23)
CO2: 27 mmol/L (ref 22–32)
Calcium: 9.5 mg/dL (ref 8.9–10.3)
Chloride: 105 mmol/L (ref 98–111)
Creatinine: 0.79 mg/dL (ref 0.44–1.00)
GFR, Estimated: >60 mL/min (ref >60)
Glucose, Bld: 108 mg/dL — ABNORMAL HIGH (ref 70–99)
Potassium: 3.7 mmol/L (ref 3.5–5.1)
Sodium: 140 mmol/L (ref 135–145)
Total Bilirubin: 0.5 mg/dL (ref 0.3–1.2)
Total Protein: 7 g/dL (ref 6.5–8.1)

## 2020-06-28 LAB — CBC WITH DIFFERENTIAL (CANCER CENTER ONLY)
Abs Immature Granulocytes: 0.04 10*3/uL (ref 0.00–0.07)
Basophils Absolute: 0 10*3/uL (ref 0.0–0.1)
Basophils Relative: 0 %
Eosinophils Absolute: 0.1 10*3/uL (ref 0.0–0.5)
Eosinophils Relative: 2 %
HCT: 42 % (ref 36.0–46.0)
Hemoglobin: 14 g/dL (ref 12.0–15.0)
Immature Granulocytes: 1 %
Lymphocytes Relative: 13 %
Lymphs Abs: 0.9 10*3/uL (ref 0.7–4.0)
MCH: 32.9 pg (ref 26.0–34.0)
MCHC: 33.3 g/dL (ref 30.0–36.0)
MCV: 98.6 fL (ref 80.0–100.0)
Monocytes Absolute: 0.8 10*3/uL (ref 0.1–1.0)
Monocytes Relative: 11 %
Neutro Abs: 5.1 10*3/uL (ref 1.7–7.7)
Neutrophils Relative %: 73 %
Platelet Count: 219 10*3/uL (ref 150–400)
RBC: 4.26 MIL/uL (ref 3.87–5.11)
RDW: 13.5 % (ref 11.5–15.5)
WBC Count: 6.9 10*3/uL (ref 4.0–10.5)
nRBC: 0 % (ref 0.0–0.2)

## 2020-06-28 LAB — LACTATE DEHYDROGENASE: LDH: 195 U/L — ABNORMAL HIGH (ref 98–192)

## 2020-06-28 MED ORDER — DEXAMETHASONE 4 MG PO TABS
8.0000 mg | ORAL_TABLET | Freq: Once | ORAL | Status: AC
  Administered 2020-06-28: 8 mg via ORAL

## 2020-06-28 MED ORDER — DARATUMUMAB-HYALURONIDASE-FIHJ 1800-30000 MG-UT/15ML ~~LOC~~ SOLN
1800.0000 mg | Freq: Once | SUBCUTANEOUS | Status: AC
  Administered 2020-06-28: 1800 mg via SUBCUTANEOUS
  Filled 2020-06-28: qty 15

## 2020-06-28 MED ORDER — DEXAMETHASONE 4 MG PO TABS
ORAL_TABLET | ORAL | Status: AC
  Filled 2020-06-28: qty 2

## 2020-06-28 MED ORDER — DIPHENHYDRAMINE HCL 25 MG PO CAPS
25.0000 mg | ORAL_CAPSULE | Freq: Once | ORAL | Status: AC
  Administered 2020-06-28: 25 mg via ORAL

## 2020-06-28 MED ORDER — DIPHENHYDRAMINE HCL 25 MG PO CAPS
ORAL_CAPSULE | ORAL | Status: AC
  Filled 2020-06-28: qty 1

## 2020-06-28 MED ORDER — ACETAMINOPHEN 325 MG PO TABS
650.0000 mg | ORAL_TABLET | Freq: Once | ORAL | Status: AC
  Administered 2020-06-28: 650 mg via ORAL

## 2020-06-28 NOTE — Progress Notes (Signed)
Pt discharged in no apparent distress. Pt left ambulatory without assistance. Pt aware of discharge instructions and verbalized understanding and had no further questions.  

## 2020-06-28 NOTE — Progress Notes (Signed)
Hematology and Oncology Follow Up Visit  Debra Ruiz [id number] 07-15-1946 74 y.o. 06/28/2020   Principle Diagnosis:  IgG Kappa myeloma - compression fractures - 13q-, +11, 14q+ - hyperdiploid -- progressive Iron def anemia  Completed Therapy: S/p Kyphoplasty of T11 and T12 XRT to lower spine-completed on 09/25/2016  Current Therapy:    Faspro -- start on 04/05/2020 -- s/p cycle #3 Ninlaro 2.3 mg po q week (3 on/1 off) -- start on 01/24/2020 -- d/c on 03/29/2020 due to toxicity Velcade/Pomalyst/Decadron -Start on 09/02/2018 --Pomalyst on hold since 11/09/2018.  D/C on 01/17/2020 Xgeva 159m Sq q  3 month - next dose in 08/2020  IV Injectafer -- last dose given on 11/2018   Interim History:  Debra Ruiz here today for follow-up.  So far, everything is going pretty well.  She really has had very little in the way of side effects.  She enjoys this treatment because of the lack of side effects.  It is hard to say how well things are working right now.  Her M spike was 0.6 g/dL when we last saw her.  Her kappa light chain was 7.7 mg/dL which is up a little bit.  Hopefully, we will begin to see this improve.  She has had no problems with fever.  She has had no issues with nausea or vomiting.  She has had no cough.  She had no rashes.  Is been no leg swelling.  Overall, I would say her performance status is ECOG 1.     Medications:  Allergies as of 06/28/2020      Reactions   Crestor [rosuvastatin Calcium]    Causes liver functions to be elevated      Medication List       Accurate as of June 28, 2020 11:00 AM. If you have any questions, ask your nurse or doctor.        ALPRAZolam 0.25 MG tablet Commonly known as: XANAX Take 1 tablet (0.25 mg total) by mouth 2 (two) times daily as needed. for anxiety   amLODipine 5 MG tablet Commonly known as: NORVASC TAKE 1 TABLET(5 MG) BY MOUTH DAILY   aspirin 81 MG EC tablet Take 81 mg by mouth daily.   atorvastatin 20 MG  tablet Commonly known as: LIPITOR TAKE 1 TABLET(20 MG) BY MOUTH DAILY   buPROPion 300 MG 24 hr tablet Commonly known as: Wellbutrin XL Take 1 tablet (300 mg total) by mouth daily.   Bystolic 5 MG tablet Generic drug: nebivolol TAKE 1 TABLET(5 MG) BY MOUTH DAILY   dexamethasone 4 MG tablet Commonly known as: DECADRON TAKE 3 TABLETS BY MOUTH 1 TIME A WEEK   escitalopram 20 MG tablet Commonly known as: LEXAPRO TAKE 1 TABLET(20 MG) BY MOUTH DAILY   Lexapro 10 MG tablet Generic drug: escitalopram Take 10 mg by mouth daily.   famciclovir 250 MG tablet Commonly known as: FAMVIR TAKE 1 TABLET(250 MG) BY MOUTH DAILY   IRON PO Take by mouth.   Light Mineral Oil-Mineral Oil 0.5-0.5 % Emul SMARTSIG:1 Drop(s) In Eye(s) Twice Daily   montelukast 10 MG tablet Commonly known as: SINGULAIR TAKE 1 TABLET(10 MG) BY MOUTH AT BEDTIME. START TAKING ON 04/02/2020   potassium chloride SA 20 MEQ tablet Commonly known as: KLOR-CON TAKE 1 TABLET(20 MEQ) BY MOUTH DAILY   prochlorperazine 10 MG tablet Commonly known as: COMPAZINE TAKE 1 TABLET(10 MG) BY MOUTH EVERY 6 HOURS AS NEEDED FOR NAUSEA OR VOMITING   Systane Ultra 0.4-0.3 %  Soln Generic drug: Polyethyl Glycol-Propyl Glycol Apply 1 drop to eye 2 (two) times daily.   temazepam 7.5 MG capsule Commonly known as: RESTORIL TAKE 1 CAPSULE(7.5 MG) BY MOUTH AT BEDTIME AS NEEDED FOR SLEEP   Vitamin B-12 5000 MCG Tbdp Take by mouth.   Vitamin D 50 MCG (2000 UT) Caps Take 2,000 Units by mouth daily.       Allergies:  Allergies  Allergen Reactions  . Crestor [Rosuvastatin Calcium]     Causes liver functions to be elevated    Past Medical History, Surgical history, Social history, and Family History were reviewed and updated.  Review of Systems: Review of Systems  Constitutional: Negative.   HENT: Negative.   Eyes: Negative.   Respiratory: Negative.   Cardiovascular: Negative.   Gastrointestinal: Negative.   Genitourinary:  Negative.   Musculoskeletal: Positive for back pain.  Skin: Negative.   Neurological: Negative.   Endo/Heme/Allergies: Negative.   Psychiatric/Behavioral: Negative.     Physical Exam:  vitals were not taken for this visit.   Wt Readings from Last 3 Encounters:  06/28/20 108 lb 1.9 oz (49 kg)  06/22/20 110 lb (49.9 kg)  05/24/20 111 lb 12.8 oz (50.7 kg)    Physical Exam Vitals reviewed.  HENT:     Head: Normocephalic and atraumatic.  Eyes:     Pupils: Pupils are equal, round, and reactive to light.  Cardiovascular:     Rate and Rhythm: Normal rate and regular rhythm.     Heart sounds: Normal heart sounds.  Pulmonary:     Effort: Pulmonary effort is normal.     Breath sounds: Normal breath sounds.  Abdominal:     General: Bowel sounds are normal.     Palpations: Abdomen is soft.  Musculoskeletal:        General: No tenderness or deformity. Normal range of motion.     Cervical back: Normal range of motion.  Lymphadenopathy:     Cervical: No cervical adenopathy.  Skin:    General: Skin is warm and dry.     Findings: No erythema or rash.  Neurological:     Mental Status: She is alert and oriented to person, place, and time.  Psychiatric:        Behavior: Behavior normal.        Thought Content: Thought content normal.        Judgment: Judgment normal.       Lab Results  Component Value Date   WBC 6.9 06/28/2020   HGB 14.0 06/28/2020   HCT 42.0 06/28/2020   MCV 98.6 06/28/2020   PLT 219 06/28/2020   Lab Results  Component Value Date   FERRITIN 128 10/06/2019   IRON 168 (H) 10/06/2019   TIBC 326 10/06/2019   UIBC 159 10/06/2019   IRONPCTSAT 51 10/06/2019   Lab Results  Component Value Date   RBC 4.26 06/28/2020   Lab Results  Component Value Date   KPAFRELGTCHN 77.6 (H) 05/31/2020   LAMBDASER 2.2 (L) 05/31/2020   KAPLAMBRATIO 35.27 (H) 05/31/2020   Lab Results  Component Value Date   IGGSERUM 972 05/24/2020   IGGSERUM 982 05/24/2020   IGA 8 (L)  05/24/2020   IGA 8 (L) 05/24/2020   IGMSERUM 32 05/24/2020   IGMSERUM 34 05/24/2020   Lab Results  Component Value Date   TOTALPROTELP 6.6 05/24/2020   ALBUMINELP 3.6 05/24/2020   A1GS 0.3 05/24/2020   A2GS 0.8 05/24/2020   BETS 1.0 05/24/2020   GAMS 0.9 05/24/2020  MSPIKE 0.6 (H) 05/24/2020   SPEI Comment 01/28/2018     Chemistry      Component Value Date/Time   NA 140 06/28/2020 0907   NA 148 (H) 07/16/2017 1128   NA 143 03/05/2017 1051   K 3.7 06/28/2020 0907   K 3.8 07/16/2017 1128   K 3.7 03/05/2017 1051   CL 105 06/28/2020 0907   CL 106 07/16/2017 1128   CO2 27 06/28/2020 0907   CO2 29 07/16/2017 1128   CO2 25 03/05/2017 1051   BUN 19 06/28/2020 0907   BUN 23 (H) 07/16/2017 1128   BUN 18.2 03/05/2017 1051   CREATININE 0.79 06/28/2020 0907   CREATININE 0.75 06/20/2020 1127   CREATININE 0.8 03/05/2017 1051      Component Value Date/Time   CALCIUM 9.5 06/28/2020 0907   CALCIUM 9.7 07/16/2017 1128   CALCIUM 8.9 03/05/2017 1051   ALKPHOS 57 06/28/2020 0907   ALKPHOS 71 07/16/2017 1128   ALKPHOS 67 03/05/2017 1051   AST 12 (L) 06/28/2020 0907   AST 15 03/05/2017 1051   ALT 12 06/28/2020 0907   ALT 23 07/16/2017 1128   ALT 17 03/05/2017 1051   BILITOT 0.5 06/28/2020 0907   BILITOT 0.55 03/05/2017 1051        Impression and Plan: Ms. Bartnick is a very pleasant 74 yo caucasian female with IgG Kappa Myeloma.    For right now, we will have to see what her myeloma studies look like.  I have to believe that they will be better.  We will go ahead with her fourth cycle of treatment.  She will will receive treatment every other week for the next couple months.  Then she will go monthly.  We will have to remember that she did have the positive Cologuard test.  I have to remind her that she really needs to have a colonoscopy.    [name], MD 12/8/202111:00 AM

## 2020-06-28 NOTE — Patient Instructions (Signed)
Daratumumab injection What is this medicine? DARATUMUMAB (dar a toom ue mab) is a monoclonal antibody. It is used to treat multiple myeloma. This medicine may be used for other purposes; ask your health care provider or pharmacist if you have questions. COMMON BRAND NAME(S): DARZALEX What should I tell my health care provider before I take this medicine? They need to know if you have any of these conditions:  infection (especially a virus infection such as chickenpox, herpes, or hepatitis B virus)  lung or breathing disease  an unusual or allergic reaction to daratumumab, other medicines, foods, dyes, or preservatives  pregnant or trying to get pregnant  breast-feeding How should I use this medicine? This medicine is for infusion into a vein. It is given by a health care professional in a hospital or clinic setting. Talk to your pediatrician regarding the use of this medicine in children. Special care may be needed. Overdosage: If you think you have taken too much of this medicine contact a poison control center or emergency room at once. NOTE: This medicine is only for you. Do not share this medicine with others. What if I miss a dose? Keep appointments for follow-up doses as directed. It is important not to miss your dose. Call your doctor or health care professional if you are unable to keep an appointment. What may interact with this medicine? Interactions have not been studied. This list may not describe all possible interactions. Give your health care provider a list of all the medicines, herbs, non-prescription drugs, or dietary supplements you use. Also tell them if you smoke, drink alcohol, or use illegal drugs. Some items may interact with your medicine. What should I watch for while using this medicine? This drug may make you feel generally unwell. Report any side effects. Continue your course of treatment even though you feel ill unless your doctor tells you to stop. This  medicine can cause serious allergic reactions. To reduce your risk you may need to take medicine before treatment with this medicine. Take your medicine as directed. This medicine can affect the results of blood tests to match your blood type. These changes can last for up to 6 months after the final dose. Your healthcare provider will do blood tests to match your blood type before you start treatment. Tell all of your healthcare providers that you are being treated with this medicine before receiving a blood transfusion. This medicine can affect the results of some tests used to determine treatment response; extra tests may be needed to evaluate response. Do not become pregnant while taking this medicine or for 3 months after stopping it. Women should inform their doctor if they wish to become pregnant or think they might be pregnant. There is a potential for serious side effects to an unborn child. Talk to your health care professional or pharmacist for more information. What side effects may I notice from receiving this medicine? Side effects that you should report to your doctor or health care professional as soon as possible:  allergic reactions like skin rash, itching or hives, swelling of the face, lips, or tongue  breathing problems  chills  cough  dizziness  feeling faint or lightheaded  headache  low blood counts - this medicine may decrease the number of white blood cells, red blood cells and platelets. You may be at increased risk for infections and bleeding.  nausea, vomiting  shortness of breath  signs of decreased platelets or bleeding - bruising, pinpoint red spots on  the skin, black, tarry stools, blood in the urine  signs of decreased red blood cells - unusually weak or tired, feeling faint or lightheaded, falls  signs of infection - fever or chills, cough, sore throat, pain or difficulty passing urine  signs and symptoms of liver injury like dark yellow or brown  urine; general ill feeling or flu-like symptoms; light-colored stools; loss of appetite; right upper belly pain; unusually weak or tired; yellowing of the eyes or skin Side effects that usually do not require medical attention (report to your doctor or health care professional if they continue or are bothersome):  back pain  constipation  diarrhea  joint pain  muscle cramps  pain, tingling, numbness in the hands or feet  swelling of the ankles, feet, hands  tiredness  trouble sleeping This list may not describe all possible side effects. Call your doctor for medical advice about side effects. You may report side effects to FDA at 1-800-FDA-1088. Where should I keep my medicine? This drug is given in a hospital or clinic and will not be stored at home. NOTE: This sheet is a summary. It may not cover all possible information. If you have questions about this medicine, talk to your doctor, pharmacist, or health care provider.  2020 Elsevier/Gold Standard (2019-03-16 18:10:54)  

## 2020-06-29 LAB — KAPPA/LAMBDA LIGHT CHAINS
Kappa free light chain: 100 mg/L — ABNORMAL HIGH (ref 3.3–19.4)
Kappa, lambda light chain ratio: 38.46 — ABNORMAL HIGH (ref 0.26–1.65)
Lambda free light chains: 2.6 mg/L — ABNORMAL LOW (ref 5.7–26.3)

## 2020-06-30 LAB — IGG, IGA, IGM
IgA: 8 mg/dL — ABNORMAL LOW (ref 64–422)
IgG (Immunoglobin G), Serum: 1310 mg/dL (ref 586–1602)
IgM (Immunoglobulin M), Srm: 41 mg/dL (ref 26–217)

## 2020-07-03 ENCOUNTER — Other Ambulatory Visit: Payer: Self-pay | Admitting: Hematology & Oncology

## 2020-07-03 DIAGNOSIS — C9000 Multiple myeloma not having achieved remission: Secondary | ICD-10-CM

## 2020-07-03 DIAGNOSIS — H04123 Dry eye syndrome of bilateral lacrimal glands: Secondary | ICD-10-CM | POA: Diagnosis not present

## 2020-07-03 DIAGNOSIS — I10 Essential (primary) hypertension: Secondary | ICD-10-CM | POA: Diagnosis not present

## 2020-07-03 DIAGNOSIS — Z961 Presence of intraocular lens: Secondary | ICD-10-CM | POA: Diagnosis not present

## 2020-07-03 LAB — PROTEIN ELECTROPHORESIS, SERUM, WITH REFLEX
A/G Ratio: 1.1 (ref 0.7–1.7)
Albumin ELP: 3.6 g/dL (ref 2.9–4.4)
Alpha-1-Globulin: 0.3 g/dL (ref 0.0–0.4)
Alpha-2-Globulin: 0.8 g/dL (ref 0.4–1.0)
Beta Globulin: 1 g/dL (ref 0.7–1.3)
Gamma Globulin: 1.2 g/dL (ref 0.4–1.8)
Globulin, Total: 3.3 g/dL (ref 2.2–3.9)
M-Spike, %: 0.9 g/dL — ABNORMAL HIGH
SPEP Interpretation: 0
Total Protein ELP: 6.9 g/dL (ref 6.0–8.5)

## 2020-07-03 LAB — IMMUNOFIXATION REFLEX, SERUM
IgA: 9 mg/dL — ABNORMAL LOW (ref 64–422)
IgG (Immunoglobin G), Serum: 1399 mg/dL (ref 586–1602)
IgM (Immunoglobulin M), Srm: 41 mg/dL (ref 26–217)

## 2020-07-05 ENCOUNTER — Telehealth: Payer: Self-pay | Admitting: Hematology & Oncology

## 2020-07-05 ENCOUNTER — Encounter: Payer: Self-pay | Admitting: Hematology & Oncology

## 2020-07-05 ENCOUNTER — Inpatient Hospital Stay (HOSPITAL_BASED_OUTPATIENT_CLINIC_OR_DEPARTMENT_OTHER): Payer: PPO | Admitting: Hematology & Oncology

## 2020-07-05 ENCOUNTER — Other Ambulatory Visit: Payer: Self-pay

## 2020-07-05 ENCOUNTER — Other Ambulatory Visit: Payer: Self-pay | Admitting: Hematology & Oncology

## 2020-07-05 VITALS — BP 131/69 | HR 81 | Temp 98.1°F | Resp 20 | Wt 108.0 lb

## 2020-07-05 DIAGNOSIS — Z5112 Encounter for antineoplastic immunotherapy: Secondary | ICD-10-CM | POA: Diagnosis not present

## 2020-07-05 DIAGNOSIS — C9000 Multiple myeloma not having achieved remission: Secondary | ICD-10-CM | POA: Diagnosis not present

## 2020-07-05 NOTE — Progress Notes (Signed)
Hematology and Oncology Follow Up Visit  Debra Ruiz [id number] 1945-08-28 74 y.o. 07/05/2020   Principle Diagnosis:  IgG Kappa myeloma - compression fractures - 13q-, +11, 14q+ - hyperdiploid -- progressive Iron def anemia  Completed Therapy: S/p Kyphoplasty of T11 and T12 XRT to lower spine-completed on 09/25/2016  Current Therapy:    Faspro -- start on 04/05/2020 -- s/p cycle #4 -- Kyprolis added on 07/20/2020 Ninlaro 2.3 mg po q week (3 on/1 off) -- start on 01/24/2020 -- d/c on 03/29/2020 due to toxicity Velcade/Pomalyst/Decadron -Start on 09/02/2018 --Pomalyst on hold since 11/09/2018.  D/C on 01/17/2020 Xgeva $RemoveBeforeDE'120mg'[id number]$  Sq q  3 month - next dose in 08/2020  IV Injectafer -- last dose given on 11/2018   Interim History:  Ms. Abate is here today for an early visit.  Unfortunately, she is not responding at all to the [facility].  I am absolutely shocked by this.  Her myeloma numbers keep going up.  We last saw her a week ago, her M spike was up to 0.9 g/dL.  Her IgG level was up to 1360 mg/dL.  The kappa light chain was 10 mg/dL.  I just hate the fact that she is not responding.  I really think that now is the time to do another bone marrow test on her.  I really need to see what the cytogenetics are on her tumor.  I know that the myeloma had abnormal cytogenetics more we first saw her 3 years ago.  I think we can add Kyprolis.  I know she is want to avoid IV chemotherapy.  I just think that Kyprolis will be helpful along with the Faspro.  She is not tolerant of pomalidomide or lenalidomide.  She feels okay.  She does have her arthritic issues.  She has had neck surgery.  She has a good appetite.  She has had no change in bowel or bladder habits.  She has had no rashes.  She has had no cough or shortness of breath.  Overall, I would say her performance status is ECOG 1.     Medications:  Allergies as of 07/05/2020      Reactions   Crestor [rosuvastatin Calcium]    Causes  liver functions to be elevated      Medication List       Accurate as of July 05, 2020 11:50 AM. If you have any questions, ask your nurse or doctor.        STOP taking these medications   Light Mineral Oil-Mineral Oil 0.5-0.5 % Emul Stopped by: [name], MD   Systane Ultra 0.4-0.3 % Soln Generic drug: Polyethyl Glycol-Propyl Glycol Stopped by: [name], MD     TAKE these medications   ALPRAZolam 0.25 MG tablet Commonly known as: XANAX Take 1 tablet (0.25 mg total) by mouth 2 (two) times daily as needed. for anxiety   amLODipine 5 MG tablet Commonly known as: NORVASC TAKE 1 TABLET(5 MG) BY MOUTH DAILY   aspirin 81 MG EC tablet Take 81 mg by mouth every other day.   atorvastatin 20 MG tablet Commonly known as: LIPITOR TAKE 1 TABLET(20 MG) BY MOUTH DAILY   buPROPion 300 MG 24 hr tablet Commonly known as: Wellbutrin XL Take 1 tablet (300 mg total) by mouth daily.   Bystolic 5 MG tablet Generic drug: nebivolol TAKE 1 TABLET(5 MG) BY MOUTH DAILY   dexamethasone 4 MG tablet Commonly known as: DECADRON TAKE 3 TABLETS BY MOUTH 1 TIME A  WEEK   escitalopram 20 MG tablet Commonly known as: LEXAPRO TAKE 1 TABLET(20 MG) BY MOUTH DAILY   Lexapro 10 MG tablet Generic drug: escitalopram Take 10 mg by mouth daily.   famciclovir 250 MG tablet Commonly known as: FAMVIR TAKE 1 TABLET(250 MG) BY MOUTH DAILY   IRON PO Take by mouth daily.   montelukast 10 MG tablet Commonly known as: SINGULAIR TAKE 1 TABLET(10 MG) BY MOUTH AT BEDTIME. START TAKING ON 04/02/2020   potassium chloride SA 20 MEQ tablet Commonly known as: KLOR-CON TAKE 1 TABLET(20 MEQ) BY MOUTH DAILY   prochlorperazine 10 MG tablet Commonly known as: COMPAZINE TAKE 1 TABLET(10 MG) BY MOUTH EVERY 6 HOURS AS NEEDED FOR NAUSEA OR VOMITING   REFRESH DRY EYE THERAPY OP Apply to eye.   temazepam 7.5 MG capsule Commonly known as: RESTORIL TAKE 1 CAPSULE(7.5 MG) BY MOUTH AT BEDTIME AS  NEEDED FOR SLEEP   Vitamin B-12 5000 MCG Tbdp Take by mouth daily.   Vitamin D 50 MCG (2000 UT) Caps Take 2,000 Units by mouth daily.       Allergies:  Allergies  Allergen Reactions  . Crestor [Rosuvastatin Calcium]     Causes liver functions to be elevated    Past Medical History, Surgical history, Social history, and Family History were reviewed and updated.  Review of Systems: Review of Systems  Constitutional: Negative.   HENT: Negative.   Eyes: Negative.   Respiratory: Negative.   Cardiovascular: Negative.   Gastrointestinal: Negative.   Genitourinary: Negative.   Musculoskeletal: Positive for back pain.  Skin: Negative.   Neurological: Negative.   Endo/Heme/Allergies: Negative.   Psychiatric/Behavioral: Negative.     Physical Exam:  weight is 108 lb (49 kg). Her temperature is 98.1 F (36.7 C). Her blood pressure is 131/69 and her pulse is 81. Her respiration is 20 and oxygen saturation is 99%.   Wt Readings from Last 3 Encounters:  07/05/20 108 lb (49 kg)  06/28/20 108 lb 1.9 oz (49 kg)  06/22/20 110 lb (49.9 kg)    Physical Exam Vitals reviewed.  HENT:     Head: Normocephalic and atraumatic.  Eyes:     Pupils: Pupils are equal, round, and reactive to light.  Cardiovascular:     Rate and Rhythm: Normal rate and regular rhythm.     Heart sounds: Normal heart sounds.  Pulmonary:     Effort: Pulmonary effort is normal.     Breath sounds: Normal breath sounds.  Abdominal:     General: Bowel sounds are normal.     Palpations: Abdomen is soft.  Musculoskeletal:        General: No tenderness or deformity. Normal range of motion.     Cervical back: Normal range of motion.  Lymphadenopathy:     Cervical: No cervical adenopathy.  Skin:    General: Skin is warm and dry.     Findings: No erythema or rash.  Neurological:     Mental Status: She is alert and oriented to person, place, and time.  Psychiatric:        Behavior: Behavior normal.         Thought Content: Thought content normal.        Judgment: Judgment normal.       Lab Results  Component Value Date   WBC 6.9 06/28/2020   HGB 14.0 06/28/2020   HCT 42.0 06/28/2020   MCV 98.6 06/28/2020   PLT 219 06/28/2020   Lab Results  Component Value Date  FERRITIN 128 10/06/2019   IRON 168 (H) 10/06/2019   TIBC 326 10/06/2019   UIBC 159 10/06/2019   IRONPCTSAT 51 10/06/2019   Lab Results  Component Value Date   RBC 4.26 06/28/2020   Lab Results  Component Value Date   KPAFRELGTCHN 100.0 (H) 06/28/2020   LAMBDASER 2.6 (L) 06/28/2020   KAPLAMBRATIO 38.46 (H) 06/28/2020   Lab Results  Component Value Date   IGGSERUM 1,310 06/28/2020   IGGSERUM 1,399 06/28/2020   IGA 8 (L) 06/28/2020   IGA 9 (L) 06/28/2020   IGMSERUM 41 06/28/2020   IGMSERUM 41 06/28/2020   Lab Results  Component Value Date   TOTALPROTELP 6.9 06/28/2020   ALBUMINELP 3.6 06/28/2020   A1GS 0.3 06/28/2020   A2GS 0.8 06/28/2020   BETS 1.0 06/28/2020   GAMS 1.2 06/28/2020   MSPIKE 0.9 (H) 06/28/2020   SPEI Comment 01/28/2018     Chemistry      Component Value Date/Time   NA 140 06/28/2020 0907   NA 148 (H) 07/16/2017 1128   NA 143 03/05/2017 1051   K 3.7 06/28/2020 0907   K 3.8 07/16/2017 1128   K 3.7 03/05/2017 1051   CL 105 06/28/2020 0907   CL 106 07/16/2017 1128   CO2 27 06/28/2020 0907   CO2 29 07/16/2017 1128   CO2 25 03/05/2017 1051   BUN 19 06/28/2020 0907   BUN 23 (H) 07/16/2017 1128   BUN 18.2 03/05/2017 1051   CREATININE 0.79 06/28/2020 0907   CREATININE 0.75 06/20/2020 1127   CREATININE 0.8 03/05/2017 1051      Component Value Date/Time   CALCIUM 9.5 06/28/2020 0907   CALCIUM 9.7 07/16/2017 1128   CALCIUM 8.9 03/05/2017 1051   ALKPHOS 57 06/28/2020 0907   ALKPHOS 71 07/16/2017 1128   ALKPHOS 67 03/05/2017 1051   AST 12 (L) 06/28/2020 0907   AST 15 03/05/2017 1051   ALT 12 06/28/2020 0907   ALT 23 07/16/2017 1128   ALT 17 03/05/2017 1051   BILITOT 0.5  06/28/2020 0907   BILITOT 0.55 03/05/2017 1051        Impression and Plan: Ms. Kalb is a very pleasant 74 yo caucasian female with IgG Kappa Myeloma.    Again, she just has not responded to single agent Faspro.  As such, we will add Kyprolis to the Faspro and see if we can get a synergistic response.  We will set her up with another bone marrow biopsy.  I will try to get this next week.  Again the cytogenetics and FISH panel will be important so we can see if there is any changes in her molecular make above the myeloma.  I spent a good 45 minutes with her.  I went over her labs.  She is well on top of everything.  She saw that her labs were heading upward.  She is very well educated.  She has a very good support system with her husband who cannot make it with her today.  We will try to get started right after Christmas.  I want her to be able to enjoy Christmas.  She is going to [place] I think to be with family.  I do not want her getting sick on her trip.  I told her that the Kyprolis will be 3 weeks on and 1 week off.  We will try to coordinate it with the [facility].  She is post to coming on 22 December.  We will cancel that appointment and  just have her come back on the 30th.   [name], MD 12/15/202111:50 AM

## 2020-07-05 NOTE — Telephone Encounter (Signed)
Called and LMVM for patient regarding appointment that has been added per 12/15 sch msg.

## 2020-07-06 ENCOUNTER — Telehealth: Payer: Self-pay

## 2020-07-06 NOTE — Telephone Encounter (Signed)
Spoke with pt per 12.16.16 los and she is aware of all appts.... AOM

## 2020-07-10 ENCOUNTER — Inpatient Hospital Stay: Payer: PPO

## 2020-07-12 ENCOUNTER — Inpatient Hospital Stay: Payer: PPO

## 2020-07-12 NOTE — Progress Notes (Signed)
Pharmacist Chemotherapy Monitoring - Initial Assessment    Anticipated start date: 07/20/20  Regimen:  . Are orders appropriate based on the patient's diagnosis, regimen, and cycle? Yes . Does the plan date match the patient's scheduled date? Yes . Is the sequencing of drugs appropriate? Yes . Are the premedications appropriate for the patient's regimen? Yes . Prior Authorization for treatment is: Approved o If applicable, is the correct biosimilar selected based on the patient's insurance? not applicable  Organ Function and Labs: Marland Kitchen Are dose adjustments needed based on the patient's renal function, hepatic function, or hematologic function? No . Are appropriate labs ordered prior to the start of patient's treatment? Yes . Other organ system assessment, if indicated: N/A . The following baseline labs, if indicated, have been ordered: N/A  Dose Assessment: . Are the drug doses appropriate? Yes . Are the following correct: o Drug concentrations Yes o IV fluid compatible with drug Yes o Administration routes Yes o Timing of therapy Yes . If applicable, does the patient have documented access for treatment and/or plans for port-a-cath placement? yes . If applicable, have lifetime cumulative doses been properly documented and assessed? not applicable Lifetime Dose Tracking  No doses have been documented on this patient for the following tracked chemicals: Doxorubicin, Epirubicin, Idarubicin, Daunorubicin, Mitoxantrone, Bleomycin, Oxaliplatin, Carboplatin, Liposomal Doxorubicin  o   Toxicity Monitoring/Prevention: . The patient has the following take home antiemetics prescribed: Prochlorperazine and Dexamethasone . The patient has the following take home medications prescribed: N/A . Medication allergies and previous infusion related reactions, if applicable, have been reviewed and addressed. Yes . The patient's current medication list has been assessed for drug-drug interactions with  their chemotherapy regimen. no significant drug-drug interactions were identified on review.  Order Review: . Are the treatment plan orders signed? Yes . Is the patient scheduled to see a provider prior to their treatment? No  I verify that I have reviewed each item in the above checklist and answered each question accordingly.  Nhyla Nappi, Jacqlyn Larsen 07/12/2020 8:29 AM

## 2020-07-16 ENCOUNTER — Other Ambulatory Visit: Payer: Self-pay | Admitting: Hematology & Oncology

## 2020-07-17 ENCOUNTER — Other Ambulatory Visit: Payer: Self-pay | Admitting: *Deleted

## 2020-07-17 DIAGNOSIS — R5383 Other fatigue: Secondary | ICD-10-CM

## 2020-07-17 DIAGNOSIS — C9000 Multiple myeloma not having achieved remission: Secondary | ICD-10-CM

## 2020-07-17 MED ORDER — TEMAZEPAM 7.5 MG PO CAPS: ORAL_CAPSULE | ORAL | 0 refills | Status: DC

## 2020-07-19 ENCOUNTER — Other Ambulatory Visit: Payer: Self-pay | Admitting: *Deleted

## 2020-07-19 DIAGNOSIS — C9000 Multiple myeloma not having achieved remission: Secondary | ICD-10-CM

## 2020-07-20 ENCOUNTER — Inpatient Hospital Stay: Payer: PPO

## 2020-07-20 ENCOUNTER — Other Ambulatory Visit: Payer: Self-pay

## 2020-07-20 VITALS — BP 124/61 | HR 89 | Temp 98.0°F | Resp 19

## 2020-07-20 DIAGNOSIS — C9000 Multiple myeloma not having achieved remission: Secondary | ICD-10-CM

## 2020-07-20 DIAGNOSIS — Z5112 Encounter for antineoplastic immunotherapy: Secondary | ICD-10-CM | POA: Diagnosis not present

## 2020-07-20 LAB — CMP (CANCER CENTER ONLY)
ALT: 12 U/L (ref 0–44)
AST: 13 U/L — ABNORMAL LOW (ref 15–41)
Albumin: 3.9 g/dL (ref 3.5–5.0)
Alkaline Phosphatase: 68 U/L (ref 38–126)
Anion gap: 9 (ref 5–15)
BUN: 17 mg/dL (ref 8–23)
CO2: 26 mmol/L (ref 22–32)
Calcium: 9.4 mg/dL (ref 8.9–10.3)
Chloride: 104 mmol/L (ref 98–111)
Creatinine: 0.74 mg/dL (ref 0.44–1.00)
GFR, Estimated: >60 mL/min (ref >60)
Glucose, Bld: 119 mg/dL — ABNORMAL HIGH (ref 70–99)
Potassium: 3.8 mmol/L (ref 3.5–5.1)
Sodium: 139 mmol/L (ref 135–145)
Total Bilirubin: 0.4 mg/dL (ref 0.3–1.2)
Total Protein: 7.1 g/dL (ref 6.5–8.1)

## 2020-07-20 LAB — CBC WITH DIFFERENTIAL (CANCER CENTER ONLY)
Abs Immature Granulocytes: 0.04 10*3/uL (ref 0.00–0.07)
Basophils Absolute: 0 10*3/uL (ref 0.0–0.1)
Basophils Relative: 0 %
Eosinophils Absolute: 0.1 10*3/uL (ref 0.0–0.5)
Eosinophils Relative: 1 %
HCT: 41.5 % (ref 36.0–46.0)
Hemoglobin: 13.6 g/dL (ref 12.0–15.0)
Immature Granulocytes: 1 %
Lymphocytes Relative: 18 %
Lymphs Abs: 1.1 10*3/uL (ref 0.7–4.0)
MCH: 32.6 pg (ref 26.0–34.0)
MCHC: 32.8 g/dL (ref 30.0–36.0)
MCV: 99.5 fL (ref 80.0–100.0)
Monocytes Absolute: 0.6 10*3/uL (ref 0.1–1.0)
Monocytes Relative: 11 %
Neutro Abs: 4 10*3/uL (ref 1.7–7.7)
Neutrophils Relative %: 69 %
Platelet Count: 277 10*3/uL (ref 150–400)
RBC: 4.17 MIL/uL (ref 3.87–5.11)
RDW: 12.6 % (ref 11.5–15.5)
WBC Count: 5.8 10*3/uL (ref 4.0–10.5)
nRBC: 0 % (ref 0.0–0.2)

## 2020-07-20 MED ORDER — DIPHENHYDRAMINE HCL 25 MG PO CAPS
ORAL_CAPSULE | ORAL | Status: AC
  Filled 2020-07-20: qty 1

## 2020-07-20 MED ORDER — LIDOCAINE-PRILOCAINE 2.5-2.5 % EX CREA: TOPICAL_CREAM | CUTANEOUS | 3 refills | Status: DC

## 2020-07-20 MED ORDER — SODIUM CHLORIDE 0.9 % IV SOLN
40.0000 mg | Freq: Once | INTRAVENOUS | Status: AC
  Administered 2020-07-20: 40 mg via INTRAVENOUS
  Filled 2020-07-20: qty 4

## 2020-07-20 MED ORDER — ACETAMINOPHEN 325 MG PO TABS
650.0000 mg | ORAL_TABLET | Freq: Once | ORAL | Status: AC
Start: 2020-07-20 — End: 2020-07-20
  Administered 2020-07-20: 650 mg via ORAL

## 2020-07-20 MED ORDER — SODIUM CHLORIDE 0.9 % IV SOLN
Freq: Once | INTRAVENOUS | Status: AC
  Filled 2020-07-20: qty 250

## 2020-07-20 MED ORDER — SODIUM CHLORIDE 0.9 % IV SOLN
Freq: Once | INTRAVENOUS | Status: DC
  Filled 2020-07-20: qty 250

## 2020-07-20 MED ORDER — DEXTROSE 5 % IV SOLN
21.0000 mg/m2 | Freq: Once | INTRAVENOUS | Status: AC
  Administered 2020-07-20: 30 mg via INTRAVENOUS
  Filled 2020-07-20: qty 15

## 2020-07-20 MED ORDER — LORAZEPAM 0.5 MG PO TABS: 0.5000 mg | ORAL_TABLET | Freq: Four times a day (QID) | ORAL | 0 refills | Status: DC | PRN

## 2020-07-20 MED ORDER — DEXAMETHASONE 4 MG PO TABS: ORAL_TABLET | ORAL | 4 refills | Status: DC

## 2020-07-20 MED ORDER — ACETAMINOPHEN 325 MG PO TABS
ORAL_TABLET | ORAL | Status: AC
  Filled 2020-07-20: qty 2

## 2020-07-20 MED ORDER — DEXAMETHASONE 4 MG PO TABS: 8.0000 mg | ORAL_TABLET | Freq: Once | ORAL | Status: DC

## 2020-07-20 MED ORDER — PROCHLORPERAZINE MALEATE 10 MG PO TABS: 10.0000 mg | ORAL_TABLET | Freq: Four times a day (QID) | ORAL | 1 refills | Status: DC | PRN

## 2020-07-20 MED ORDER — DARATUMUMAB-HYALURONIDASE-FIHJ 1800-30000 MG-UT/15ML ~~LOC~~ SOLN
1800.0000 mg | Freq: Once | SUBCUTANEOUS | Status: AC
  Administered 2020-07-20: 1800 mg via SUBCUTANEOUS
  Filled 2020-07-20: qty 15

## 2020-07-20 MED ORDER — DIPHENHYDRAMINE HCL 25 MG PO CAPS
25.0000 mg | ORAL_CAPSULE | Freq: Once | ORAL | Status: AC
  Administered 2020-07-20: 25 mg via ORAL

## 2020-07-20 MED ORDER — ONDANSETRON HCL 8 MG PO TABS: 8.0000 mg | ORAL_TABLET | Freq: Two times a day (BID) | ORAL | 1 refills | Status: DC | PRN

## 2020-07-20 NOTE — Patient Instructions (Signed)
Daratumumab injection What is this medicine? DARATUMUMAB (dar a toom ue mab) is a monoclonal antibody. It is used to treat multiple myeloma. This medicine may be used for other purposes; ask your health care provider or pharmacist if you have questions. COMMON BRAND NAME(S): DARZALEX What should I tell my health care provider before I take this medicine? They need to know if you have any of these conditions:  infection (especially a virus infection such as chickenpox, herpes, or hepatitis B virus)  lung or breathing disease  an unusual or allergic reaction to daratumumab, other medicines, foods, dyes, or preservatives  pregnant or trying to get pregnant  breast-feeding How should I use this medicine? This medicine is for infusion into a vein. It is given by a health care professional in a hospital or clinic setting. Talk to your pediatrician regarding the use of this medicine in children. Special care may be needed. Overdosage: If you think you have taken too much of this medicine contact a poison control center or emergency room at once. NOTE: This medicine is only for you. Do not share this medicine with others. What if I miss a dose? Keep appointments for follow-up doses as directed. It is important not to miss your dose. Call your doctor or health care professional if you are unable to keep an appointment. What may interact with this medicine? Interactions have not been studied. This list may not describe all possible interactions. Give your health care provider a list of all the medicines, herbs, non-prescription drugs, or dietary supplements you use. Also tell them if you smoke, drink alcohol, or use illegal drugs. Some items may interact with your medicine. What should I watch for while using this medicine? This drug may make you feel generally unwell. Report any side effects. Continue your course of treatment even though you feel ill unless your doctor tells you to stop. This  medicine can cause serious allergic reactions. To reduce your risk you may need to take medicine before treatment with this medicine. Take your medicine as directed. This medicine can affect the results of blood tests to match your blood type. These changes can last for up to 6 months after the final dose. Your healthcare provider will do blood tests to match your blood type before you start treatment. Tell all of your healthcare providers that you are being treated with this medicine before receiving a blood transfusion. This medicine can affect the results of some tests used to determine treatment response; extra tests may be needed to evaluate response. Do not become pregnant while taking this medicine or for 3 months after stopping it. Women should inform their doctor if they wish to become pregnant or think they might be pregnant. There is a potential for serious side effects to an unborn child. Talk to your health care professional or pharmacist for more information. What side effects may I notice from receiving this medicine? Side effects that you should report to your doctor or health care professional as soon as possible:  allergic reactions like skin rash, itching or hives, swelling of the face, lips, or tongue  breathing problems  chills  cough  dizziness  feeling faint or lightheaded  headache  low blood counts - this medicine may decrease the number of white blood cells, red blood cells and platelets. You may be at increased risk for infections and bleeding.  nausea, vomiting  shortness of breath  signs of decreased platelets or bleeding - bruising, pinpoint red spots on  the skin, black, tarry stools, blood in the urine  signs of decreased red blood cells - unusually weak or tired, feeling faint or lightheaded, falls  signs of infection - fever or chills, cough, sore throat, pain or difficulty passing urine  signs and symptoms of liver injury like dark yellow or brown  urine; general ill feeling or flu-like symptoms; light-colored stools; loss of appetite; right upper belly pain; unusually weak or tired; yellowing of the eyes or skin Side effects that usually do not require medical attention (report to your doctor or health care professional if they continue or are bothersome):  back pain  constipation  diarrhea  joint pain  muscle cramps  pain, tingling, numbness in the hands or feet  swelling of the ankles, feet, hands  tiredness  trouble sleeping This list may not describe all possible side effects. Call your doctor for medical advice about side effects. You may report side effects to FDA at 1-800-FDA-1088. Where should I keep my medicine? This drug is given in a hospital or clinic and will not be stored at home. NOTE: This sheet is a summary. It may not cover all possible information. If you have questions about this medicine, talk to your doctor, pharmacist, or health care provider.  2020 Elsevier/Gold Standard (2019-03-16 18:10:54) Carfilzomib injection What is this medicine? CARFILZOMIB (kar FILZ oh mib) targets a specific protein within cancer cells and stops the cancer cells from growing. It is used to treat multiple myeloma. This medicine may be used for other purposes; ask your health care provider or pharmacist if you have questions. COMMON BRAND NAME(S): KYPROLIS What should I tell my health care provider before I take this medicine? They need to know if you have any of these conditions:  heart disease  history of blood clots  irregular heartbeat  kidney disease  liver disease  lung or breathing disease  an unusual or allergic reaction to carfilzomib, or other medicines, foods, dyes, or preservatives  pregnant or trying to get pregnant  breast-feeding How should I use this medicine? This medicine is for injection or infusion into a vein. It is given by a health care professional in a hospital or clinic setting. Talk  to your pediatrician regarding the use of this medicine in children. Special care may be needed. Overdosage: If you think you have taken too much of this medicine contact a poison control center or emergency room at once. NOTE: This medicine is only for you. Do not share this medicine with others. What if I miss a dose? It is important not to miss your dose. Call your doctor or health care professional if you are unable to keep an appointment. What may interact with this medicine? Interactions are not expected. Give your health care provider a list of all the medicines, herbs, non-prescription drugs, or dietary supplements you use. Also tell them if you smoke, drink alcohol, or use illegal drugs. Some items may interact with your medicine. This list may not describe all possible interactions. Give your health care provider a list of all the medicines, herbs, non-prescription drugs, or dietary supplements you use. Also tell them if you smoke, drink alcohol, or use illegal drugs. Some items may interact with your medicine. What should I watch for while using this medicine? Your condition will be monitored carefully while you are receiving this medicine. Report any side effects. Continue your course of treatment even though you feel ill unless your doctor tells you to stop. You may need blood  work done while you are taking this medicine. Do not become pregnant while taking this medicine or for at least 6 months after stopping it. Women should inform their doctor if they wish to become pregnant or think they might be pregnant. There is a potential for serious side effects to an unborn child. Men should not father a child while taking this medicine and for at least 3 months after stopping it. Talk to your health care professional or pharmacist for more information. Do not breast-feed an infant while taking this medicine or for 2 weeks after the last dose. Check with your doctor or health care professional if  you get an attack of severe diarrhea, nausea and vomiting, or if you sweat a lot. The loss of too much body fluid can make it dangerous for you to take this medicine. You may get dizzy. Do not drive, use machinery, or do anything that needs mental alertness until you know how this medicine affects you. Do not stand or sit up quickly, especially if you are an older patient. This reduces the risk of dizzy or fainting spells. What side effects may I notice from receiving this medicine? Side effects that you should report to your doctor or health care professional as soon as possible:  allergic reactions like skin rash, itching or hives, swelling of the face, lips, or tongue  confusion  dizziness  feeling faint or lightheaded  fever or chills  palpitations  seizures  signs and symptoms of bleeding such as bloody or black, tarry stools; red or dark-brown urine; spitting up blood or brown material that looks like coffee grounds; red spots on the skin; unusual bruising or bleeding including from the eye, gums, or nose  signs and symptoms of a blood clot such as breathing problems; changes in vision; chest pain; severe, sudden headache; pain, swelling, warmth in the leg; trouble speaking; sudden numbness or weakness of the face, arm or leg  signs and symptoms of kidney injury like trouble passing urine or change in the amount of urine  signs and symptoms of liver injury like dark yellow or brown urine; general ill feeling or flu-like symptoms; light-colored stools; loss of appetite; nausea; right upper belly pain; unusually weak or tired; yellowing of the eyes or skin Side effects that usually do not require medical attention (report to your doctor or health care professional if they continue or are bothersome):  back pain  cough  diarrhea  headache  muscle cramps  trouble sleeping  vomiting This list may not describe all possible side effects. Call your doctor for medical advice  about side effects. You may report side effects to FDA at 1-800-FDA-1088. Where should I keep my medicine? This drug is given in a hospital or clinic and will not be stored at home. NOTE: This sheet is a summary. It may not cover all possible information. If you have questions about this medicine, talk to your doctor, pharmacist, or health care provider.  2020 Elsevier/Gold Standard (2019-03-15 19:44:21)

## 2020-07-26 ENCOUNTER — Other Ambulatory Visit: Payer: PPO

## 2020-07-26 ENCOUNTER — Ambulatory Visit: Payer: PPO

## 2020-07-27 ENCOUNTER — Other Ambulatory Visit: Payer: Self-pay | Admitting: Hematology & Oncology

## 2020-07-27 ENCOUNTER — Inpatient Hospital Stay: Payer: PPO | Attending: Hematology & Oncology

## 2020-07-27 ENCOUNTER — Inpatient Hospital Stay: Payer: PPO

## 2020-07-27 ENCOUNTER — Other Ambulatory Visit: Payer: Self-pay

## 2020-07-27 VITALS — BP 109/51 | HR 95 | Temp 98.7°F | Resp 18

## 2020-07-27 DIAGNOSIS — Z79899 Other long term (current) drug therapy: Secondary | ICD-10-CM | POA: Diagnosis not present

## 2020-07-27 DIAGNOSIS — C9000 Multiple myeloma not having achieved remission: Secondary | ICD-10-CM | POA: Diagnosis not present

## 2020-07-27 DIAGNOSIS — Z5112 Encounter for antineoplastic immunotherapy: Secondary | ICD-10-CM | POA: Insufficient documentation

## 2020-07-27 LAB — CBC WITH DIFFERENTIAL (CANCER CENTER ONLY)
Abs Immature Granulocytes: 0.06 10*3/uL (ref 0.00–0.07)
Basophils Absolute: 0 10*3/uL (ref 0.0–0.1)
Basophils Relative: 0 %
Eosinophils Absolute: 0.1 10*3/uL (ref 0.0–0.5)
Eosinophils Relative: 1 %
HCT: 44.4 % (ref 36.0–46.0)
Hemoglobin: 14.6 g/dL (ref 12.0–15.0)
Immature Granulocytes: 1 %
Lymphocytes Relative: 15 %
Lymphs Abs: 1.6 10*3/uL (ref 0.7–4.0)
MCH: 32.6 pg (ref 26.0–34.0)
MCHC: 32.9 g/dL (ref 30.0–36.0)
MCV: 99.1 fL (ref 80.0–100.0)
Monocytes Absolute: 1.1 10*3/uL — ABNORMAL HIGH (ref 0.1–1.0)
Monocytes Relative: 10 %
Neutro Abs: 8.1 10*3/uL — ABNORMAL HIGH (ref 1.7–7.7)
Neutrophils Relative %: 73 %
Platelet Count: 305 10*3/uL (ref 150–400)
RBC: 4.48 MIL/uL (ref 3.87–5.11)
RDW: 12.8 % (ref 11.5–15.5)
WBC Count: 10.9 10*3/uL — ABNORMAL HIGH (ref 4.0–10.5)
nRBC: 0 % (ref 0.0–0.2)

## 2020-07-27 MED ORDER — DEXTROSE 5 % IV SOLN
36.0000 mg/m2 | Freq: Once | INTRAVENOUS | Status: AC
  Administered 2020-07-27: 50 mg via INTRAVENOUS
  Filled 2020-07-27: qty 15

## 2020-07-27 MED ORDER — SODIUM CHLORIDE 0.9 % IV SOLN
Freq: Once | INTRAVENOUS | Status: DC
  Filled 2020-07-27: qty 250

## 2020-07-27 MED ORDER — SODIUM CHLORIDE 0.9 % IV SOLN
Freq: Once | INTRAVENOUS | Status: AC
  Filled 2020-07-27: qty 250

## 2020-07-27 MED ORDER — SODIUM CHLORIDE 0.9 % IV SOLN
40.0000 mg | Freq: Once | INTRAVENOUS | Status: AC
  Administered 2020-07-27: 40 mg via INTRAVENOUS
  Filled 2020-07-27: qty 4

## 2020-07-27 NOTE — Patient Instructions (Signed)
Pt discharged in no apparent distress. Pt left ambulatory without assistance. Pt aware of discharge instructions and verbalized understanding and had no further questions.Carfilzomib injection What is this medicine? CARFILZOMIB (kar FILZ oh mib) targets a specific protein within cancer cells and stops the cancer cells from growing. It is used to treat multiple myeloma. This medicine may be used for other purposes; ask your health care provider or pharmacist if you have questions. COMMON BRAND NAME(S): KYPROLIS What should I tell my health care provider before I take this medicine? They need to know if you have any of these conditions:  heart disease  history of blood clots  irregular heartbeat  kidney disease  liver disease  lung or breathing disease  an unusual or allergic reaction to carfilzomib, or other medicines, foods, dyes, or preservatives  pregnant or trying to get pregnant  breast-feeding How should I use this medicine? This medicine is for injection or infusion into a vein. It is given by a health care professional in a hospital or clinic setting. Talk to your pediatrician regarding the use of this medicine in children. Special care may be needed. Overdosage: If you think you have taken too much of this medicine contact a poison control center or emergency room at once. NOTE: This medicine is only for you. Do not share this medicine with others. What if I miss a dose? It is important not to miss your dose. Call your doctor or health care professional if you are unable to keep an appointment. What may interact with this medicine? Interactions are not expected. Give your health care provider a list of all the medicines, herbs, non-prescription drugs, or dietary supplements you use. Also tell them if you smoke, drink alcohol, or use illegal drugs. Some items may interact with your medicine. This list may not describe all possible interactions. Give your health care provider  a list of all the medicines, herbs, non-prescription drugs, or dietary supplements you use. Also tell them if you smoke, drink alcohol, or use illegal drugs. Some items may interact with your medicine. What should I watch for while using this medicine? Your condition will be monitored carefully while you are receiving this medicine. Report any side effects. Continue your course of treatment even though you feel ill unless your doctor tells you to stop. You may need blood work done while you are taking this medicine. Do not become pregnant while taking this medicine or for at least 6 months after stopping it. Women should inform their doctor if they wish to become pregnant or think they might be pregnant. There is a potential for serious side effects to an unborn child. Men should not father a child while taking this medicine and for at least 3 months after stopping it. Talk to your health care professional or pharmacist for more information. Do not breast-feed an infant while taking this medicine or for 2 weeks after the last dose. Check with your doctor or health care professional if you get an attack of severe diarrhea, nausea and vomiting, or if you sweat a lot. The loss of too much body fluid can make it dangerous for you to take this medicine. You may get dizzy. Do not drive, use machinery, or do anything that needs mental alertness until you know how this medicine affects you. Do not stand or sit up quickly, especially if you are an older patient. This reduces the risk of dizzy or fainting spells. What side effects may I notice from receiving this  medicine? Side effects that you should report to your doctor or health care professional as soon as possible:  allergic reactions like skin rash, itching or hives, swelling of the face, lips, or tongue  confusion  dizziness  feeling faint or lightheaded  fever or chills  palpitations  seizures  signs and symptoms of bleeding such as bloody or  black, tarry stools; red or dark-brown urine; spitting up blood or brown material that looks like coffee grounds; red spots on the skin; unusual bruising or bleeding including from the eye, gums, or nose  signs and symptoms of a blood clot such as breathing problems; changes in vision; chest pain; severe, sudden headache; pain, swelling, warmth in the leg; trouble speaking; sudden numbness or weakness of the face, arm or leg  signs and symptoms of kidney injury like trouble passing urine or change in the amount of urine  signs and symptoms of liver injury like dark yellow or brown urine; general ill feeling or flu-like symptoms; light-colored stools; loss of appetite; nausea; right upper belly pain; unusually weak or tired; yellowing of the eyes or skin Side effects that usually do not require medical attention (report to your doctor or health care professional if they continue or are bothersome):  back pain  cough  diarrhea  headache  muscle cramps  trouble sleeping  vomiting This list may not describe all possible side effects. Call your doctor for medical advice about side effects. You may report side effects to FDA at 1-800-FDA-1088. Where should I keep my medicine? This drug is given in a hospital or clinic and will not be stored at home. NOTE: This sheet is a summary. It may not cover all possible information. If you have questions about this medicine, talk to your doctor, pharmacist, or health care provider.  2020 Elsevier/Gold Standard (2019-03-15 19:44:21)

## 2020-07-27 NOTE — Progress Notes (Signed)
Patient did not have chemistry drawn today. Per Dr Myna Hidalgo, ok to use CMET from 07/20/2020. dph

## 2020-07-28 ENCOUNTER — Other Ambulatory Visit: Payer: Self-pay | Admitting: Radiology

## 2020-07-31 ENCOUNTER — Other Ambulatory Visit: Payer: Self-pay

## 2020-07-31 ENCOUNTER — Ambulatory Visit (HOSPITAL_COMMUNITY)
Admission: RE | Admit: 2020-07-31 | Discharge: 2020-07-31 | Disposition: A | Discharge status: Home / Self Care | Payer: PPO | Source: Ambulatory Visit | Attending: Hematology & Oncology | Admitting: Hematology & Oncology

## 2020-07-31 ENCOUNTER — Encounter (HOSPITAL_COMMUNITY): Payer: Self-pay

## 2020-07-31 DIAGNOSIS — Z87891 Personal history of nicotine dependence: Secondary | ICD-10-CM | POA: Diagnosis not present

## 2020-07-31 DIAGNOSIS — C9000 Multiple myeloma not having achieved remission: Secondary | ICD-10-CM | POA: Diagnosis not present

## 2020-07-31 DIAGNOSIS — C9002 Multiple myeloma in relapse: Secondary | ICD-10-CM | POA: Diagnosis not present

## 2020-07-31 DIAGNOSIS — D751 Secondary polycythemia: Secondary | ICD-10-CM | POA: Diagnosis not present

## 2020-07-31 LAB — CBC WITH DIFFERENTIAL/PLATELET
Abs Immature Granulocytes: 0.17 10*3/uL — ABNORMAL HIGH (ref 0.00–0.07)
Basophils Absolute: 0 10*3/uL (ref 0.0–0.1)
Basophils Relative: 0 %
Eosinophils Absolute: 0.1 10*3/uL (ref 0.0–0.5)
Eosinophils Relative: 1 %
HCT: 46.1 % — ABNORMAL HIGH (ref 36.0–46.0)
Hemoglobin: 15.5 g/dL — ABNORMAL HIGH (ref 12.0–15.0)
Immature Granulocytes: 2 %
Lymphocytes Relative: 24 %
Lymphs Abs: 2.3 10*3/uL (ref 0.7–4.0)
MCH: 32.8 pg (ref 26.0–34.0)
MCHC: 33.6 g/dL (ref 30.0–36.0)
MCV: 97.7 fL (ref 80.0–100.0)
Monocytes Absolute: 0.9 10*3/uL (ref 0.1–1.0)
Monocytes Relative: 10 %
Neutro Abs: 5.9 10*3/uL (ref 1.7–7.7)
Neutrophils Relative %: 63 %
Platelets: 219 10*3/uL (ref 150–400)
RBC: 4.72 MIL/uL (ref 3.87–5.11)
RDW: 12.9 % (ref 11.5–15.5)
WBC: 9.3 10*3/uL (ref 4.0–10.5)
nRBC: 0.3 % — ABNORMAL HIGH (ref 0.0–0.2)

## 2020-07-31 LAB — PROTIME-INR
INR: 0.9 (ref 0.8–1.2)
Prothrombin Time: 12.1 seconds (ref 11.4–15.2)

## 2020-07-31 MED ORDER — MIDAZOLAM HCL 2 MG/2ML IJ SOLN
INTRAMUSCULAR | Status: AC | PRN
  Administered 2020-07-31 (×2): 1 mg via INTRAVENOUS

## 2020-07-31 MED ORDER — FENTANYL CITRATE (PF) 100 MCG/2ML IJ SOLN
INTRAMUSCULAR | Status: AC | PRN
  Administered 2020-07-31 (×2): 50 ug via INTRAVENOUS

## 2020-07-31 MED ORDER — FENTANYL CITRATE (PF) 100 MCG/2ML IJ SOLN
INTRAMUSCULAR | Status: AC
  Filled 2020-07-31: qty 2

## 2020-07-31 MED ORDER — MIDAZOLAM HCL 2 MG/2ML IJ SOLN
INTRAMUSCULAR | Status: AC
  Filled 2020-07-31: qty 4

## 2020-07-31 MED ORDER — LIDOCAINE HCL (PF) 1 % IJ SOLN
INTRAMUSCULAR | Status: AC | PRN
  Administered 2020-07-31: 10 mL

## 2020-07-31 MED ORDER — SODIUM CHLORIDE 0.9 % IV SOLN: INTRAVENOUS | Status: DC

## 2020-07-31 NOTE — Procedures (Signed)
Interventional Radiology Procedure Note  Procedure: CT guided aspirate and core biopsy of right iliac bone  Complications: None  Bleeding: Minimal   

## 2020-07-31 NOTE — Discharge Instructions (Signed)
Please call Interventional Radiology clinic [phone] with any questions or concerns.  You may remove your dressing and shower tomorrow.   Bone Marrow Aspiration and Bone Marrow Biopsy, Adult, Care After This sheet gives you information about how to care for yourself after your procedure. Your health care provider may also give you more specific instructions. If you have problems or questions, contact your health care provider. What can I expect after the procedure? After the procedure, it is common to have:  Mild pain and tenderness.  Swelling.  Bruising. Follow these instructions at home: Puncture site care  Follow instructions from your health care provider about how to take care of the puncture site. Make sure you: ? Wash your hands with soap and water before and after you change your bandage (dressing). If soap and water are not available, use hand sanitizer. ? Change your dressing as told by your health care provider.  Check your puncture site every day for signs of infection. Check for: ? More redness, swelling, or pain. ? Fluid or blood. ? Warmth. ? Pus or a bad smell.   Activity  Return to your normal activities as told by your health care provider. Ask your health care provider what activities are safe for you.  Do not lift anything that is heavier than 10 lb (4.5 kg), or the limit that you are told, until your health care provider says that it is safe.  Do not drive for 24 hours if you were given a sedative during your procedure. General instructions  Take over-the-counter and prescription medicines only as told by your health care provider.  Do not take baths, swim, or use a hot tub until your health care provider approves. Ask your health care provider if you may take showers. You may only be allowed to take sponge baths.  If directed, put ice on the affected area. To do this: ? Put ice in a plastic bag. ? Place a towel between your skin and the bag. ? Leave  the ice on for 20 minutes, 2-3 times a day.  Keep all follow-up visits as told by your health care provider. This is important.   Contact a health care provider if:  Your pain is not controlled with medicine.  You have a fever.  You have more redness, swelling, or pain around the puncture site.  You have fluid or blood coming from the puncture site.  Your puncture site feels warm to the touch.  You have pus or a bad smell coming from the puncture site. Summary  After the procedure, it is common to have mild pain, tenderness, swelling, and bruising.  Follow instructions from your health care provider about how to take care of the puncture site and what activities are safe for you.  Take over-the-counter and prescription medicines only as told by your health care provider.  Contact a health care provider if you have any signs of infection, such as fluid or blood coming from the puncture site. This information is not intended to replace advice given to you by your health care provider. Make sure you discuss any questions you have with your health care provider. Document Revised: 11/24/2018 Document Reviewed: 11/24/2018 Elsevier Patient Education  2021 Elsevier Inc.   Moderate Conscious Sedation, Adult, Care After This sheet gives you information about how to care for yourself after your procedure. Your health care provider may also give you more specific instructions. If you have problems or questions, contact your health care provider. What   soon. °Follow these instructions at home: °For the time period you were told by your health care provider: °Rest. °Do not participate in activities where you could fall or become injured. °Do not drive or use machinery. °Do not drink alcohol. °Do not take sleeping pills or medicines that cause drowsiness. °Do not  make important decisions or sign legal documents. °Do not take care of children on your own.  °  °  °Eating and drinking °Follow the diet recommended by your health care provider. °Drink enough fluid to keep your urine pale yellow. °If you vomit: °Drink water, juice, or soup when you can drink without vomiting. °Make sure you have little or no nausea before eating solid foods.   °General instructions °Take over-the-counter and prescription medicines only as told by your health care provider. °Have a responsible adult stay with you for the time you are told. It is important to have someone help care for you until you are awake and alert. °Do not smoke. °Keep all follow-up visits as told by your health care provider. This is important. °Contact a health care provider if: °You are still sleepy or having trouble with balance after 24 hours. °You feel light-headed. °You keep feeling nauseous or you keep vomiting. °You develop a rash. °You have a fever. °You have redness or swelling around the IV site. °Get help right away if: °You have trouble breathing. °You have new-onset confusion at home. °Summary °After the procedure, it is common to feel sleepy, have impaired judgment, or feel nauseous if you eat too soon. °Rest after you get home. Know the things you should not do after the procedure. °Follow the diet recommended by your health care provider and drink enough fluid to keep your urine pale yellow. °Get help right away if you have trouble breathing or new-onset confusion at home. °This information is not intended to replace advice given to you by your health care provider. Make sure you discuss any questions you have with your health care provider. °Document Revised: 11/05/2019 Document Reviewed: 06/03/2019 °Elsevier Patient Education © 2021 Elsevier Inc.  °

## 2020-07-31 NOTE — H&P (Signed)
Chief Complaint: Patient was seen in consultation today for bone marrow biopsy and aspiration  Referring Physician(s): Josph Macho  Supervising Physician: Simonne Come  Patient Status: [facility] - Out-pt  History of Present Illness: Debra Ruiz is a 75 y.o. female with a medical history significant for HTN, anxiety, anemia and multiple myeloma. Recent lab work shows the myeloma numbers are increasing and the current therapy regimen is not producing the expected results.    Interventional Radiology has been asked to evaluate this patient for an image-guided bone marrow biopsy and aspiration for further work up.    Past Medical History:  Diagnosis Date  . Anemia   . Anxiety   . Arthritis   . Cataracts, bilateral    removed by surgery  . Depression   . Dry eyes   . Goals of care, counseling/discussion 08/28/2016  . Hearing loss    bilateral - no hearing aids  . History of radiation therapy 09/04/16-09/25/16   spine T11-S3 30 Gy in 15 fractions  . Hyperlipidemia   . Hypertension   . Insomnia   . Iron deficiency anemia due to chronic blood loss 12/03/2018  . Iron malabsorption 12/03/2018  . Multiple myeloma (HCC) 08/22/2016   current chemo treatment  . Palpitations   . Skin cancer    mohs - surgery  . Vaginal dryness     Past Surgical History:  Procedure Laterality Date  . EYE SURGERY Bilateral    lasik  . EYE SURGERY Bilateral    cataracts removed  . FACIAL COSMETIC SURGERY     face lift  . KYPHOPLASTY N/A 08/22/2016   Procedure: THORACIC 11, THORACIC 12 KYPHOPLASTY;  Surgeon: Estill Bamberg, MD;  Location: [facility] OR;  Service: Orthopedics;  Laterality: N/A;  THORACIC 11, THORACIC 12 KYPHOPLASTY  . POSTERIOR CERVICAL FUSION/FORAMINOTOMY N/A 04/29/2019   Procedure: CERVICAL THREE- CERVICAL SEVEN POSTERIOR CERVICAL DECOMPRESSION FUSION WITH INSTRUMENTATION AND ALLOGRAFT;  Surgeon: Estill Bamberg, MD;  Location: [facility] OR;  Service: Orthopedics;  Laterality: N/A;  CERVICAL THREE-  CERVICAL SEVEN POSTERIOR CERVICAL DECOMPRESSION FUSION WITH INSTRUMENTATION AND ALLOGRAFT  . WISDOM TOOTH EXTRACTION      Allergies: Crestor [rosuvastatin calcium]  Medications: Prior to Admission medications   Medication Sig Start Date End Date Taking? Authorizing Provider  amLODipine (NORVASC) 5 MG tablet TAKE 1 TABLET(5 MG) BY MOUTH DAILY 03/21/20  Yes Baxley, Luanna Cole, MD  atorvastatin (LIPITOR) 20 MG tablet TAKE 1 TABLET(20 MG) BY MOUTH DAILY 05/07/20  Yes Baxley, Luanna Cole, MD  buPROPion (WELLBUTRIN XL) 300 MG 24 hr tablet Take 1 tablet (300 mg total) by mouth daily. 03/13/20  Yes Baxley, Luanna Cole, MD  BYSTOLIC 5 MG tablet TAKE 1 TABLET(5 MG) BY MOUTH DAILY 05/09/20  Yes Baxley, Luanna Cole, MD  Cholecalciferol (VITAMIN D) 2000 units CAPS Take 2,000 Units by mouth daily.    Yes [provider]  Cyanocobalamin (VITAMIN B-12) 5000 MCG TBDP Take by mouth daily.   Yes [provider]  dexamethasone (DECADRON) 4 MG tablet Take 10 tablets (40mg ) on day 22. Repeat every 28 days for 9 cycles. Take with breakfast. 07/20/20  Yes Ennever, 07/22/20, MD  escitalopram (LEXAPRO) 20 MG tablet TAKE 1 TABLET(20 MG) BY MOUTH DAILY 12/02/19  Yes 12/04/19, MD  famciclovir (FAMVIR) 250 MG tablet TAKE 1 TABLET(250 MG) BY MOUTH DAILY 11/15/19  Yes 11/17/19, MD  Ferrous Sulfate (IRON PO) Take by mouth daily.   Yes [provider]  Glycerin-Polysorbate 80 (REFRESH  DRY EYE THERAPY OP) Apply to eye.   Yes [provider]  potassium chloride SA (KLOR-CON) 20 MEQ tablet TAKE 1 TABLET(20 MEQ) BY MOUTH DAILY 03/21/20  Yes Baxley, Cresenciano Lick, MD  prochlorperazine (COMPAZINE) 10 MG tablet TAKE 1 TABLET(10 MG) BY MOUTH EVERY 6 HOURS AS NEEDED FOR NAUSEA OR VOMITING 11/06/16  Yes Ennever, Rudell Cobb, MD  temazepam (RESTORIL) 7.5 MG capsule TAKE 1 CAPSULE(7.5 MG) BY MOUTH AT BEDTIME AS NEEDED FOR SLEEP 07/17/20  Yes Ennever, Rudell Cobb, MD  ALPRAZolam Duanne Moron) 0.25 MG tablet Take 1 tablet (0.25 mg  total) by mouth 2 (two) times daily as needed. for anxiety Patient not taking: Reported on 07/05/2020 03/08/19   Elby Showers, MD  aspirin 81 MG EC tablet Take 81 mg by mouth every other day.    [provider]  dexamethasone (DECADRON) 4 MG tablet TAKE 3 TABLETS BY MOUTH 1 TIME A WEEK 07/03/20   Ennever, Rudell Cobb, MD  LEXAPRO 10 MG tablet Take 10 mg by mouth daily. 05/22/20   [provider]  lidocaine-prilocaine (EMLA) cream Apply to affected area once 07/20/20   Ennever, Rudell Cobb, MD  LORazepam (ATIVAN) 0.5 MG tablet Take 1 tablet (0.5 mg total) by mouth every 6 (six) hours as needed (Nausea or vomiting). 07/20/20   [name], MD  montelukast (SINGULAIR) 10 MG tablet TAKE 1 TABLET(10 MG) BY MOUTH AT BEDTIME. START TAKING ON 04/02/2020 03/29/20   [name], MD  ondansetron (ZOFRAN) 8 MG tablet Take 1 tablet (8 mg total) by mouth 2 (two) times daily as needed (Nausea or vomiting). 07/20/20   [name], MD  prochlorperazine (COMPAZINE) 10 MG tablet Take 1 tablet (10 mg total) by mouth every 6 (six) hours as needed (Nausea or vomiting). 07/20/20   [name], MD     Family History  Problem Relation Age of Onset  . Hypertension Mother   . Stroke Mother   . Heart disease Father   . Heart disease Brother   . Multiple myeloma Cousin   . Multiple myeloma Maternal Aunt     Social History   Socioeconomic History  . Marital status: Married    Spouse name: Not on file  . Number of children: 0  . Years of education: Not on file  . Highest education level: Not on file  Occupational History  . Occupation: insurance  Tobacco Use  . Smoking status: Former Smoker    Packs/day: 1.00    Years: 12.00    Pack years: 12.00    Types: Cigarettes    Quit date: 02/13/1985    Years since quitting: 35.4  . Smokeless tobacco: Never Used  Vaping Use  . Vaping Use: Never used  Substance and Sexual Activity  . Alcohol use: Yes    Alcohol/week: 7.0 standard drinks     Types: 7 Glasses of wine per week  . Drug use: No  . Sexual activity: Not Currently    Birth control/protection: Post-menopausal  Other Topics Concern  . Not on file  Social History Narrative  . Not on file   Social Determinants of Health   Financial Resource Strain: Not on file  Food Insecurity: Not on file  Transportation Needs: Not on file  Physical Activity: Not on file  Stress: Not on file  Social Connections: Not on file    Review of Systems: A 12 point ROS discussed and pertinent positives are indicated in the HPI above.  All other systems  are negative.  Review of Systems  Constitutional: Negative for appetite change and fatigue.  Respiratory: Negative for cough and shortness of breath.   Cardiovascular: Negative for chest pain and leg swelling.  Gastrointestinal: Positive for diarrhea. Negative for abdominal pain, nausea and vomiting.  Musculoskeletal: Positive for back pain.  Neurological: Positive for headaches.    Vital Signs: Temp 98.4; BP 146/80; HR 89; RR 16, O2 sats 96% on room air.  Ht 5' (1.524 m)   Wt 110 lb (49.9 kg)   LMP  (LMP Unknown)   BMI 21.48 kg/m   Physical Exam Constitutional:      General: She is not in acute distress. HENT:     Mouth/Throat:     Mouth: Mucous membranes are moist.     Pharynx: Oropharynx is clear.  Cardiovascular:     Rate and Rhythm: Normal rate and regular rhythm.     Pulses: Normal pulses.     Heart sounds: Normal heart sounds.  Pulmonary:     Effort: Pulmonary effort is normal.     Breath sounds: Normal breath sounds.  Abdominal:     General: Bowel sounds are normal.     Palpations: Abdomen is soft.  Musculoskeletal:        General: Normal range of motion.     Right lower leg: No edema.     Left lower leg: No edema.  Skin:    General: Skin is warm and dry.  Neurological:     Mental Status: She is alert and oriented to person, place, and time.     Imaging: No results found.  Labs:  CBC: Recent  Labs    06/28/20 0907 07/20/20 0803 07/27/20 1007 07/31/20 0730  WBC 6.9 5.8 10.9* 9.3  HGB 14.0 13.6 14.6 15.5*  HCT 42.0 41.5 44.4 46.1*  PLT 219 277 305 219    COAGS: Recent Labs    07/31/20 0730  INR 0.9    BMP: Recent Labs    03/29/20 1259 04/12/20 1043 04/19/20 0953 04/26/20 1033 06/14/20 0956 06/20/20 1127 06/28/20 0907 07/20/20 0803  NA 139 137 138   < > 139 141 140 139  K 4.3 4.0 4.7   < > 4.0 4.4 3.7 3.8  CL 100 102 103   < > 103 106 105 104  CO2 $Re'29 26 25   'Khz$ < > $R'29 25 27 26  'IB$ GLUCOSE 124* 84 170*   < > 133* 92 108* 119*  BUN $Re'17 20 20   'cTX$ < > $R'13 12 19 17  'ov$ CALCIUM 10.0 9.5 10.0   < > 9.6 9.3 9.5 9.4  CREATININE 0.78 0.72 0.72   < > 0.72 0.75 0.79 0.74  GFRNONAA >60 >60 >60   < > >60 79 >60 >60  GFRAA >60 >60 >60  --   --  91  --   --    < > = values in this interval not displayed.    LIVER FUNCTION TESTS: Recent Labs    05/31/20 0947 06/14/20 0956 06/20/20 1127 06/28/20 0907 07/20/20 0803  BILITOT 0.5 0.4 0.5 0.5 0.4  AST 12* 16 15 12* 13*  ALT $Re'8 10 12 12 12  'bZC$ ALKPHOS 56 64  --  57 68  PROT 6.3* 7.1 6.6 7.0 7.1  ALBUMIN 3.8 4.2  --  4.3 3.9    TUMOR MARKERS: No results for input(s): AFPTM, CEA, CA199, CHROMGRNA in the last 8760 hours.  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   Risks and benefits of this procedure were discussed with the patient and/or patient's family including, but not limited to bleeding, infection, damage to adjacent structures or low yield requiring additional tests.  All of the questions were answered and there is agreement to proceed.  She has been  NPO. Labs and vitals have been reviewed.   Consent signed and in chart.  Thank you for this interesting consult.  I greatly enjoyed meeting Debra Ruiz and look forward to participating in their care.   A copy of this report was sent to the requesting provider on this date.  Electronically Signed: Soyla Dryer, AGACNP-BC [phone] 07/31/2020, 8:19 AM   I spent a total of  30 Minutes   in face to face in clinical consultation, greater than 50% of which was counseling/coordinating care for bone marrow biopsy and aspiration.

## 2020-08-03 ENCOUNTER — Other Ambulatory Visit: Payer: Self-pay

## 2020-08-03 ENCOUNTER — Inpatient Hospital Stay: Payer: PPO

## 2020-08-03 ENCOUNTER — Other Ambulatory Visit: Payer: Self-pay | Admitting: *Deleted

## 2020-08-03 VITALS — BP 145/55 | HR 77 | Temp 98.1°F | Resp 16

## 2020-08-03 DIAGNOSIS — Z5112 Encounter for antineoplastic immunotherapy: Secondary | ICD-10-CM | POA: Diagnosis not present

## 2020-08-03 DIAGNOSIS — C9000 Multiple myeloma not having achieved remission: Secondary | ICD-10-CM

## 2020-08-03 LAB — COMPREHENSIVE METABOLIC PANEL
ALT: 13 U/L (ref 0–44)
AST: 11 U/L — ABNORMAL LOW (ref 15–41)
Albumin: 3.9 g/dL (ref 3.5–5.0)
Alkaline Phosphatase: 68 U/L (ref 38–126)
Anion gap: 6 (ref 5–15)
BUN: 28 mg/dL — ABNORMAL HIGH (ref 8–23)
CO2: 26 mmol/L (ref 22–32)
Calcium: 9.3 mg/dL (ref 8.9–10.3)
Chloride: 107 mmol/L (ref 98–111)
Creatinine, Ser: 0.73 mg/dL (ref 0.44–1.00)
GFR, Estimated: >60 mL/min (ref >60)
Glucose, Bld: 71 mg/dL (ref 70–99)
Potassium: 4.3 mmol/L (ref 3.5–5.1)
Sodium: 139 mmol/L (ref 135–145)
Total Bilirubin: 0.4 mg/dL (ref 0.3–1.2)
Total Protein: 6.3 g/dL — ABNORMAL LOW (ref 6.5–8.1)

## 2020-08-03 LAB — CBC WITH DIFFERENTIAL (CANCER CENTER ONLY)
Abs Immature Granulocytes: 0.07 10*3/uL (ref 0.00–0.07)
Basophils Absolute: 0 10*3/uL (ref 0.0–0.1)
Basophils Relative: 0 %
Eosinophils Absolute: 0 10*3/uL (ref 0.0–0.5)
Eosinophils Relative: 0 %
HCT: 41 % (ref 36.0–46.0)
Hemoglobin: 13.9 g/dL (ref 12.0–15.0)
Immature Granulocytes: 1 %
Lymphocytes Relative: 15 %
Lymphs Abs: 1.2 10*3/uL (ref 0.7–4.0)
MCH: 33 pg (ref 26.0–34.0)
MCHC: 33.9 g/dL (ref 30.0–36.0)
MCV: 97.4 fL (ref 80.0–100.0)
Monocytes Absolute: 1 10*3/uL (ref 0.1–1.0)
Monocytes Relative: 13 %
Neutro Abs: 5.7 10*3/uL (ref 1.7–7.7)
Neutrophils Relative %: 71 %
Platelet Count: 186 10*3/uL (ref 150–400)
RBC: 4.21 MIL/uL (ref 3.87–5.11)
RDW: 12.9 % (ref 11.5–15.5)
WBC Count: 8 10*3/uL (ref 4.0–10.5)
nRBC: 0 % (ref 0.0–0.2)

## 2020-08-03 MED ORDER — SODIUM CHLORIDE 0.9 % IV SOLN
Freq: Once | INTRAVENOUS | Status: AC
  Filled 2020-08-03: qty 250

## 2020-08-03 MED ORDER — ACETAMINOPHEN 325 MG PO TABS
650.0000 mg | ORAL_TABLET | Freq: Once | ORAL | Status: AC
  Administered 2020-08-03: 650 mg via ORAL

## 2020-08-03 MED ORDER — ACETAMINOPHEN 325 MG PO TABS
ORAL_TABLET | ORAL | Status: AC
  Filled 2020-08-03: qty 2

## 2020-08-03 MED ORDER — DARATUMUMAB-HYALURONIDASE-FIHJ 1800-30000 MG-UT/15ML ~~LOC~~ SOLN
1800.0000 mg | Freq: Once | SUBCUTANEOUS | Status: AC
Start: 2020-08-03 — End: 2020-08-03
  Administered 2020-08-03: 1800 mg via SUBCUTANEOUS
  Filled 2020-08-03: qty 15

## 2020-08-03 MED ORDER — DIPHENHYDRAMINE HCL 25 MG PO CAPS
25.0000 mg | ORAL_CAPSULE | Freq: Once | ORAL | Status: AC
  Administered 2020-08-03: 25 mg via ORAL

## 2020-08-03 MED ORDER — DIPHENHYDRAMINE HCL 25 MG PO CAPS
ORAL_CAPSULE | ORAL | Status: AC
  Filled 2020-08-03: qty 2

## 2020-08-03 MED ORDER — DEXTROSE 5 % IV SOLN
56.0000 mg/m2 | Freq: Once | INTRAVENOUS | Status: AC
  Administered 2020-08-03: 80 mg via INTRAVENOUS
  Filled 2020-08-03: qty 30

## 2020-08-03 MED ORDER — SODIUM CHLORIDE 0.9 % IV SOLN
Freq: Once | INTRAVENOUS | Status: DC
  Filled 2020-08-03: qty 250

## 2020-08-03 MED ORDER — HEPARIN SOD (PORK) LOCK FLUSH 100 UNIT/ML IV SOLN
500.0000 [IU] | Freq: Once | INTRAVENOUS | Status: DC | PRN
  Filled 2020-08-03: qty 5

## 2020-08-03 MED ORDER — SODIUM CHLORIDE 0.9% FLUSH
10.0000 mL | INTRAVENOUS | Status: DC | PRN
Start: 2020-08-03 — End: 2020-08-03
  Filled 2020-08-03: qty 10

## 2020-08-03 MED ORDER — DEXAMETHASONE 4 MG PO TABS: 8.0000 mg | ORAL_TABLET | Freq: Once | ORAL | Status: DC

## 2020-08-03 MED ORDER — SODIUM CHLORIDE 0.9 % IV SOLN
40.0000 mg | Freq: Once | INTRAVENOUS | Status: AC
  Administered 2020-08-03: 40 mg via INTRAVENOUS
  Filled 2020-08-03: qty 4

## 2020-08-03 NOTE — Patient Instructions (Signed)
Daratumumab injection What is this medicine? DARATUMUMAB (dar a toom ue mab) is a monoclonal antibody. It is used to treat multiple myeloma. This medicine may be used for other purposes; ask your health care provider or pharmacist if you have questions. COMMON BRAND NAME(S): DARZALEX What should I tell my health care provider before I take this medicine? They need to know if you have any of these conditions:  hereditary fructose intolerance  infection (especially a virus infection such as chickenpox, herpes, or hepatitis B virus)  lung or breathing disease (asthma, COPD)  an unusual or allergic reaction to daratumumab, sorbitol, other medicines, foods, dyes, or preservatives  pregnant or trying to get pregnant  breast-feeding How should I use this medicine? This medicine is for infusion into a vein. It is given by a health care professional in a hospital or clinic setting. Talk to your pediatrician regarding the use of this medicine in children. Special care may be needed. Overdosage: If you think you have taken too much of this medicine contact a poison control center or emergency room at once. NOTE: This medicine is only for you. Do not share this medicine with others. What if I miss a dose? Keep appointments for follow-up doses as directed. It is important not to miss your dose. Call your doctor or health care professional if you are unable to keep an appointment. What may interact with this medicine? Interactions have not been studied. This list may not describe all possible interactions. Give your health care provider a list of all the medicines, herbs, non-prescription drugs, or dietary supplements you use. Also tell them if you smoke, drink alcohol, or use illegal drugs. Some items may interact with your medicine. What should I watch for while using this medicine? Your condition will be monitored carefully while you are receiving this medicine. This medicine can cause serious  allergic reactions. To reduce your risk, your health care provider may give you other medicine to take before receiving this one. Be sure to follow the directions from your health care provider. This medicine can affect the results of blood tests to match your blood type. These changes can last for up to 6 months after the final dose. Your healthcare provider will do blood tests to match your blood type before you start treatment. Tell all of your healthcare providers that you are being treated with this medicine before receiving a blood transfusion. This medicine can affect the results of some tests used to determine treatment response; extra tests may be needed to evaluate response. Do not become pregnant while taking this medicine or for 3 months after stopping it. Women should inform their health care provider if they wish to become pregnant or think they might be pregnant. There is a potential for serious side effects to an unborn child. Talk to your health care provider for more information. Do not breast-feed an infant while taking this medicine. What side effects may I notice from receiving this medicine? Side effects that you should report to your doctor or health care professional as soon as possible:  allergic reactions (skin rash; itching or hives; swelling of the face, lips, or tongue)  infection (fever, chills, cough, sore throat, pain or difficulty passing urine)  infusion reaction (dizziness, fast heartbeat, feeling faint or lightheaded, falls, headache, increase in blood pressure, nausea, vomiting, or wheezing or trouble breathing with loud or whistling sounds)  unusual bleeding or bruising Side effects that usually do not require medical attention (report to your doctor  or health care professional if they continue or are bothersome):  constipation  diarrhea  pain, tingling, numbness in the hands or feet  swelling of the ankles, feet, hands  tiredness This list may not  describe all possible side effects. Call your doctor for medical advice about side effects. You may report side effects to FDA at 1-800-FDA-1088. Where should I keep my medicine? This drug is given in a hospital or clinic and will not be stored at home. NOTE: This sheet is a summary. It may not cover all possible information. If you have questions about this medicine, talk to your doctor, pharmacist, or health care provider.  2021 Elsevier/Gold Standard (2020-06-29 13:28:52) Carfilzomib injection What is this medicine? CARFILZOMIB (kar FILZ oh mib) targets a specific protein within cancer cells and stops the cancer cells from growing. It is used to treat multiple myeloma. This medicine may be used for other purposes; ask your health care provider or pharmacist if you have questions. COMMON BRAND NAME(S): KYPROLIS What should I tell my health care provider before I take this medicine? They need to know if you have any of these conditions:  heart disease  history of blood clots  irregular heartbeat  kidney disease  liver disease  lung or breathing disease  an unusual or allergic reaction to carfilzomib, or other medicines, foods, dyes, or preservatives  pregnant or trying to get pregnant  breast-feeding How should I use this medicine? This medicine is for injection or infusion into a vein. It is given by a health care professional in a hospital or clinic setting. Talk to your pediatrician regarding the use of this medicine in children. Special care may be needed. Overdosage: If you think you have taken too much of this medicine contact a poison control center or emergency room at once. NOTE: This medicine is only for you. Do not share this medicine with others. What if I miss a dose? It is important not to miss your dose. Call your doctor or health care professional if you are unable to keep an appointment. What may interact with this medicine? Interactions are not  expected. Give your health care provider a list of all the medicines, herbs, non-prescription drugs, or dietary supplements you use. Also tell them if you smoke, drink alcohol, or use illegal drugs. Some items may interact with your medicine. This list may not describe all possible interactions. Give your health care provider a list of all the medicines, herbs, non-prescription drugs, or dietary supplements you use. Also tell them if you smoke, drink alcohol, or use illegal drugs. Some items may interact with your medicine. What should I watch for while using this medicine? Your condition will be monitored carefully while you are receiving this medicine. Report any side effects. Continue your course of treatment even though you feel ill unless your doctor tells you to stop. You may need blood work done while you are taking this medicine. Do not become pregnant while taking this medicine or for at least 6 months after stopping it. Women should inform their doctor if they wish to become pregnant or think they might be pregnant. There is a potential for serious side effects to an unborn child. Men should not father a child while taking this medicine and for at least 3 months after stopping it. Talk to your health care professional or pharmacist for more information. Do not breast-feed an infant while taking this medicine or for 2 weeks after the last dose. Check with your doctor  or health care professional if you get an attack of severe diarrhea, nausea and vomiting, or if you sweat a lot. The loss of too much body fluid can make it dangerous for you to take this medicine. You may get dizzy. Do not drive, use machinery, or do anything that needs mental alertness until you know how this medicine affects you. Do not stand or sit up quickly, especially if you are an older patient. This reduces the risk of dizzy or fainting spells. What side effects may I notice from receiving this medicine? Side effects that you  should report to your doctor or health care professional as soon as possible:  allergic reactions like skin rash, itching or hives, swelling of the face, lips, or tongue  confusion  dizziness  feeling faint or lightheaded  fever or chills  palpitations  seizures  signs and symptoms of bleeding such as bloody or black, tarry stools; red or dark-brown urine; spitting up blood or brown material that looks like coffee grounds; red spots on the skin; unusual bruising or bleeding including from the eye, gums, or nose  signs and symptoms of a blood clot such as breathing problems; changes in vision; chest pain; severe, sudden headache; pain, swelling, warmth in the leg; trouble speaking; sudden numbness or weakness of the face, arm or leg  signs and symptoms of kidney injury like trouble passing urine or change in the amount of urine  signs and symptoms of liver injury like dark yellow or brown urine; general ill feeling or flu-like symptoms; light-colored stools; loss of appetite; nausea; right upper belly pain; unusually weak or tired; yellowing of the eyes or skin Side effects that usually do not require medical attention (report to your doctor or health care professional if they continue or are bothersome):  back pain  cough  diarrhea  headache  muscle cramps  trouble sleeping  vomiting This list may not describe all possible side effects. Call your doctor for medical advice about side effects. You may report side effects to FDA at 1-800-FDA-1088. Where should I keep my medicine? This drug is given in a hospital or clinic and will not be stored at home. NOTE: This sheet is a summary. It may not cover all possible information. If you have questions about this medicine, talk to your doctor, pharmacist, or health care provider.  2021 Elsevier/Gold Standard (2019-03-15 19:44:21)

## 2020-08-08 ENCOUNTER — Encounter (HOSPITAL_COMMUNITY): Payer: Self-pay | Admitting: Hematology & Oncology

## 2020-08-09 ENCOUNTER — Encounter (HOSPITAL_COMMUNITY): Payer: Self-pay | Admitting: Hematology & Oncology

## 2020-08-09 NOTE — Patient Instructions (Addendum)
She is under excellent care with Dr. Marin Olp.  She will continue to watch her blood pressure.  Return in 1 year or as needed.  Declines repeat Cologuard study at this time.  Continue current medications.  Immunizations are up-to-date.  Continue antidepressant medications as previously prescribed.

## 2020-08-10 ENCOUNTER — Ambulatory Visit: Payer: PPO

## 2020-08-10 ENCOUNTER — Ambulatory Visit: Payer: PPO | Admitting: Hematology & Oncology

## 2020-08-10 ENCOUNTER — Other Ambulatory Visit: Payer: PPO

## 2020-08-10 ENCOUNTER — Telehealth: Payer: Self-pay | Admitting: *Deleted

## 2020-08-10 LAB — SURGICAL PATHOLOGY

## 2020-08-10 NOTE — Telephone Encounter (Signed)
Patient called requesting Dr Marin Olp call regarding results of her BM biopsy.  Dr Marin Olp notified and will call patient.  Patient aware

## 2020-08-17 ENCOUNTER — Inpatient Hospital Stay (HOSPITAL_BASED_OUTPATIENT_CLINIC_OR_DEPARTMENT_OTHER): Payer: PPO | Admitting: Hematology & Oncology

## 2020-08-17 ENCOUNTER — Other Ambulatory Visit: Payer: Self-pay

## 2020-08-17 ENCOUNTER — Telehealth: Payer: Self-pay

## 2020-08-17 ENCOUNTER — Inpatient Hospital Stay: Payer: PPO

## 2020-08-17 ENCOUNTER — Encounter: Payer: Self-pay | Admitting: Hematology & Oncology

## 2020-08-17 VITALS — BP 118/64 | HR 94 | Temp 98.2°F | Resp 19 | Wt 110.0 lb

## 2020-08-17 DIAGNOSIS — C9000 Multiple myeloma not having achieved remission: Secondary | ICD-10-CM

## 2020-08-17 DIAGNOSIS — Z5112 Encounter for antineoplastic immunotherapy: Secondary | ICD-10-CM | POA: Diagnosis not present

## 2020-08-17 LAB — CBC WITH DIFFERENTIAL (CANCER CENTER ONLY)
Abs Immature Granulocytes: 0.04 10*3/uL (ref 0.00–0.07)
Basophils Absolute: 0 10*3/uL (ref 0.0–0.1)
Basophils Relative: 0 %
Eosinophils Absolute: 0 10*3/uL (ref 0.0–0.5)
Eosinophils Relative: 1 %
HCT: 39.1 % (ref 36.0–46.0)
Hemoglobin: 13.1 g/dL (ref 12.0–15.0)
Immature Granulocytes: 1 %
Lymphocytes Relative: 9 %
Lymphs Abs: 0.5 10*3/uL — ABNORMAL LOW (ref 0.7–4.0)
MCH: 32.9 pg (ref 26.0–34.0)
MCHC: 33.5 g/dL (ref 30.0–36.0)
MCV: 98.2 fL (ref 80.0–100.0)
Monocytes Absolute: 0.6 10*3/uL (ref 0.1–1.0)
Monocytes Relative: 10 %
Neutro Abs: 5 10*3/uL (ref 1.7–7.7)
Neutrophils Relative %: 79 %
Platelet Count: 250 10*3/uL (ref 150–400)
RBC: 3.98 MIL/uL (ref 3.87–5.11)
RDW: 13.2 % (ref 11.5–15.5)
WBC Count: 6.2 10*3/uL (ref 4.0–10.5)
nRBC: 0 % (ref 0.0–0.2)

## 2020-08-17 LAB — CMP (CANCER CENTER ONLY)
ALT: 13 U/L (ref 0–44)
AST: 13 U/L — ABNORMAL LOW (ref 15–41)
Albumin: 3.9 g/dL (ref 3.5–5.0)
Alkaline Phosphatase: 72 U/L (ref 38–126)
Anion gap: 7 (ref 5–15)
BUN: 17 mg/dL (ref 8–23)
CO2: 27 mmol/L (ref 22–32)
Calcium: 9.2 mg/dL (ref 8.9–10.3)
Chloride: 105 mmol/L (ref 98–111)
Creatinine: 0.73 mg/dL (ref 0.44–1.00)
GFR, Estimated: >60 mL/min (ref >60)
Glucose, Bld: 104 mg/dL — ABNORMAL HIGH (ref 70–99)
Potassium: 4 mmol/L (ref 3.5–5.1)
Sodium: 139 mmol/L (ref 135–145)
Total Bilirubin: 0.7 mg/dL (ref 0.3–1.2)
Total Protein: 6.2 g/dL — ABNORMAL LOW (ref 6.5–8.1)

## 2020-08-17 LAB — LACTATE DEHYDROGENASE: LDH: 189 U/L (ref 98–192)

## 2020-08-17 MED ORDER — DIPHENHYDRAMINE HCL 25 MG PO CAPS
25.0000 mg | ORAL_CAPSULE | Freq: Once | ORAL | Status: AC
  Administered 2020-08-17: 25 mg via ORAL

## 2020-08-17 MED ORDER — SODIUM CHLORIDE 0.9 % IV SOLN
40.0000 mg | Freq: Once | INTRAVENOUS | Status: AC
  Administered 2020-08-17: 40 mg via INTRAVENOUS
  Filled 2020-08-17: qty 4

## 2020-08-17 MED ORDER — SODIUM CHLORIDE 0.9 % IV SOLN
Freq: Once | INTRAVENOUS | Status: AC
  Filled 2020-08-17: qty 250

## 2020-08-17 MED ORDER — DARATUMUMAB-HYALURONIDASE-FIHJ 1800-30000 MG-UT/15ML ~~LOC~~ SOLN
1800.0000 mg | Freq: Once | SUBCUTANEOUS | Status: AC
  Administered 2020-08-17: 1800 mg via SUBCUTANEOUS
  Filled 2020-08-17: qty 15

## 2020-08-17 MED ORDER — ACETAMINOPHEN 325 MG PO TABS
650.0000 mg | ORAL_TABLET | Freq: Once | ORAL | Status: AC
  Administered 2020-08-17: 650 mg via ORAL

## 2020-08-17 MED ORDER — SODIUM CHLORIDE 0.9 % IV SOLN
Freq: Once | INTRAVENOUS | Status: DC
  Filled 2020-08-17: qty 250

## 2020-08-17 MED ORDER — HEPARIN SOD (PORK) LOCK FLUSH 100 UNIT/ML IV SOLN
500.0000 [IU] | Freq: Once | INTRAVENOUS | Status: DC | PRN
  Filled 2020-08-17: qty 5

## 2020-08-17 MED ORDER — ACETAMINOPHEN 325 MG PO TABS
ORAL_TABLET | ORAL | Status: AC
  Filled 2020-08-17: qty 2

## 2020-08-17 MED ORDER — DEXAMETHASONE 4 MG PO TABS: 8.0000 mg | ORAL_TABLET | Freq: Once | ORAL | Status: DC

## 2020-08-17 MED ORDER — DEXTROSE 5 % IV SOLN
56.0000 mg/m2 | Freq: Once | INTRAVENOUS | Status: AC
  Administered 2020-08-17: 80 mg via INTRAVENOUS
  Filled 2020-08-17: qty 30

## 2020-08-17 MED ORDER — DIPHENHYDRAMINE HCL 25 MG PO CAPS
ORAL_CAPSULE | ORAL | Status: AC
  Filled 2020-08-17: qty 1

## 2020-08-17 MED ORDER — SODIUM CHLORIDE 0.9% FLUSH
10.0000 mL | INTRAVENOUS | Status: DC | PRN
  Filled 2020-08-17: qty 10

## 2020-08-17 NOTE — Patient Instructions (Signed)
Daratumumab injection What is this medicine? DARATUMUMAB (dar a toom ue mab) is a monoclonal antibody. It is used to treat multiple myeloma. This medicine may be used for other purposes; ask your health care provider or pharmacist if you have questions. COMMON BRAND NAME(S): DARZALEX What should I tell my health care provider before I take this medicine? They need to know if you have any of these conditions:  hereditary fructose intolerance  infection (especially a virus infection such as chickenpox, herpes, or hepatitis B virus)  lung or breathing disease (asthma, COPD)  an unusual or allergic reaction to daratumumab, sorbitol, other medicines, foods, dyes, or preservatives  pregnant or trying to get pregnant  breast-feeding How should I use this medicine? This medicine is for infusion into a vein. It is given by a health care professional in a hospital or clinic setting. Talk to your pediatrician regarding the use of this medicine in children. Special care may be needed. Overdosage: If you think you have taken too much of this medicine contact a poison control center or emergency room at once. NOTE: This medicine is only for you. Do not share this medicine with others. What if I miss a dose? Keep appointments for follow-up doses as directed. It is important not to miss your dose. Call your doctor or health care professional if you are unable to keep an appointment. What may interact with this medicine? Interactions have not been studied. This list may not describe all possible interactions. Give your health care provider a list of all the medicines, herbs, non-prescription drugs, or dietary supplements you use. Also tell them if you smoke, drink alcohol, or use illegal drugs. Some items may interact with your medicine. What should I watch for while using this medicine? Your condition will be monitored carefully while you are receiving this medicine. This medicine can cause serious  allergic reactions. To reduce your risk, your health care provider may give you other medicine to take before receiving this one. Be sure to follow the directions from your health care provider. This medicine can affect the results of blood tests to match your blood type. These changes can last for up to 6 months after the final dose. Your healthcare provider will do blood tests to match your blood type before you start treatment. Tell all of your healthcare providers that you are being treated with this medicine before receiving a blood transfusion. This medicine can affect the results of some tests used to determine treatment response; extra tests may be needed to evaluate response. Do not become pregnant while taking this medicine or for 3 months after stopping it. Women should inform their health care provider if they wish to become pregnant or think they might be pregnant. There is a potential for serious side effects to an unborn child. Talk to your health care provider for more information. Do not breast-feed an infant while taking this medicine. What side effects may I notice from receiving this medicine? Side effects that you should report to your doctor or health care professional as soon as possible:  allergic reactions (skin rash; itching or hives; swelling of the face, lips, or tongue)  infection (fever, chills, cough, sore throat, pain or difficulty passing urine)  infusion reaction (dizziness, fast heartbeat, feeling faint or lightheaded, falls, headache, increase in blood pressure, nausea, vomiting, or wheezing or trouble breathing with loud or whistling sounds)  unusual bleeding or bruising Side effects that usually do not require medical attention (report to your doctor  or health care professional if they continue or are bothersome):  constipation  diarrhea  pain, tingling, numbness in the hands or feet  swelling of the ankles, feet, hands  tiredness This list may not  describe all possible side effects. Call your doctor for medical advice about side effects. You may report side effects to FDA at 1-800-FDA-1088. Where should I keep my medicine? This drug is given in a hospital or clinic and will not be stored at home. NOTE: This sheet is a summary. It may not cover all possible information. If you have questions about this medicine, talk to your doctor, pharmacist, or health care provider.  2021 Elsevier/Gold Standard (2020-06-29 13:28:52) Carfilzomib injection What is this medicine? CARFILZOMIB (kar FILZ oh mib) targets a specific protein within cancer cells and stops the cancer cells from growing. It is used to treat multiple myeloma. This medicine may be used for other purposes; ask your health care provider or pharmacist if you have questions. COMMON BRAND NAME(S): KYPROLIS What should I tell my health care provider before I take this medicine? They need to know if you have any of these conditions:  heart disease  history of blood clots  irregular heartbeat  kidney disease  liver disease  lung or breathing disease  an unusual or allergic reaction to carfilzomib, or other medicines, foods, dyes, or preservatives  pregnant or trying to get pregnant  breast-feeding How should I use this medicine? This medicine is for injection or infusion into a vein. It is given by a health care professional in a hospital or clinic setting. Talk to your pediatrician regarding the use of this medicine in children. Special care may be needed. Overdosage: If you think you have taken too much of this medicine contact a poison control center or emergency room at once. NOTE: This medicine is only for you. Do not share this medicine with others. What if I miss a dose? It is important not to miss your dose. Call your doctor or health care professional if you are unable to keep an appointment. What may interact with this medicine? Interactions are not  expected. Give your health care provider a list of all the medicines, herbs, non-prescription drugs, or dietary supplements you use. Also tell them if you smoke, drink alcohol, or use illegal drugs. Some items may interact with your medicine. This list may not describe all possible interactions. Give your health care provider a list of all the medicines, herbs, non-prescription drugs, or dietary supplements you use. Also tell them if you smoke, drink alcohol, or use illegal drugs. Some items may interact with your medicine. What should I watch for while using this medicine? Your condition will be monitored carefully while you are receiving this medicine. Report any side effects. Continue your course of treatment even though you feel ill unless your doctor tells you to stop. You may need blood work done while you are taking this medicine. Do not become pregnant while taking this medicine or for at least 6 months after stopping it. Women should inform their doctor if they wish to become pregnant or think they might be pregnant. There is a potential for serious side effects to an unborn child. Men should not father a child while taking this medicine and for at least 3 months after stopping it. Talk to your health care professional or pharmacist for more information. Do not breast-feed an infant while taking this medicine or for 2 weeks after the last dose. Check with your doctor  or health care professional if you get an attack of severe diarrhea, nausea and vomiting, or if you sweat a lot. The loss of too much body fluid can make it dangerous for you to take this medicine. You may get dizzy. Do not drive, use machinery, or do anything that needs mental alertness until you know how this medicine affects you. Do not stand or sit up quickly, especially if you are an older patient. This reduces the risk of dizzy or fainting spells. What side effects may I notice from receiving this medicine? Side effects that you  should report to your doctor or health care professional as soon as possible:  allergic reactions like skin rash, itching or hives, swelling of the face, lips, or tongue  confusion  dizziness  feeling faint or lightheaded  fever or chills  palpitations  seizures  signs and symptoms of bleeding such as bloody or black, tarry stools; red or dark-brown urine; spitting up blood or brown material that looks like coffee grounds; red spots on the skin; unusual bruising or bleeding including from the eye, gums, or nose  signs and symptoms of a blood clot such as breathing problems; changes in vision; chest pain; severe, sudden headache; pain, swelling, warmth in the leg; trouble speaking; sudden numbness or weakness of the face, arm or leg  signs and symptoms of kidney injury like trouble passing urine or change in the amount of urine  signs and symptoms of liver injury like dark yellow or brown urine; general ill feeling or flu-like symptoms; light-colored stools; loss of appetite; nausea; right upper belly pain; unusually weak or tired; yellowing of the eyes or skin Side effects that usually do not require medical attention (report to your doctor or health care professional if they continue or are bothersome):  back pain  cough  diarrhea  headache  muscle cramps  trouble sleeping  vomiting This list may not describe all possible side effects. Call your doctor for medical advice about side effects. You may report side effects to FDA at 1-800-FDA-1088. Where should I keep my medicine? This drug is given in a hospital or clinic and will not be stored at home. NOTE: This sheet is a summary. It may not cover all possible information. If you have questions about this medicine, talk to your doctor, pharmacist, or health care provider.  2021 Elsevier/Gold Standard (2019-03-15 19:44:21)

## 2020-08-17 NOTE — Telephone Encounter (Signed)
appts per 08/17/20 los have been made and pt will rec them from tx/avs- and my chart   Debra Ruiz

## 2020-08-17 NOTE — Progress Notes (Addendum)
Hematology and Oncology Follow Up Visit  Debra Ruiz [id number] 10/07/1945 75 y.o. 08/17/2020   Principle Diagnosis:  IgG Kappa myeloma - compression fractures - 13q-, +11, 14q+ - hyperdiploid -- progressive Iron def anemia  Completed Therapy: S/p Kyphoplasty of T11 and T12 XRT to lower spine-completed on 09/25/2016  Current Therapy:    Faspro -- start on 04/05/2020 -- s/p cycle #4 -- Kyprolis added on 07/20/2020 -- s/p cycle #1 Ninlaro 2.3 mg po q week (3 on/1 off) -- start on 01/24/2020 -- d/c on 03/29/2020 due to toxicity Velcade/Pomalyst/Decadron -Start on 09/02/2018 --Pomalyst on hold since 11/09/2018.  D/C on 01/17/2020 Xgeva $RemoveBeforeDE'120mg'bJPtpyxNINspIOf$  Sq q  3 month - next dose in 08/2020  IV Injectafer -- last dose given on 11/2018   Interim History:  Debra Ruiz is here today for follow-up.  Unfortunately, we thought that get a bone marrow biopsy on her would be a good idea.  We try to get this done.  Unfortunately, the result was suboptimal.  Not sure as to why the result was suboptimal.  There were no marrow spicular particles.  As such, we cannot get any FISH or cytogenetic studies.  Hopefully, we will not have to do another one.  She tolerated the first cycle of Kyprolis with the Faspro.   Her last myeloma studies done back in early December showed M spike 0.9 g/dL.  Her IgG level was 1350 mg/dL.  Her kappa light chain was 10 mg/dL.  She did have a nice Christmas.  Her cat is doing well.  She has had no nausea or vomiting.  She had no cough or shortness of breath.  She has been very diligent with avoiding the coronavirus.  There has been no change in bowel or bladder habits..  She is complaining of some throat irritation.  She has a persistent amount of mucus in the throat.  She tried to clear her throat.  I suspect this might be more a byproduct of the past neck surgery that she had.  She is not bringing up any blood or any purulent sputum.  She has had no fever.  There has been no leg  swelling.  Overall, performance status is ECOG 1.        Medications:  Allergies as of 08/17/2020      Reactions   Crestor [rosuvastatin Calcium]    Causes liver functions to be elevated      Medication List       Accurate as of August 17, 2020  8:22 AM. If you have any questions, ask your nurse or doctor.        ALPRAZolam 0.25 MG tablet Commonly known as: XANAX Take 1 tablet (0.25 mg total) by mouth 2 (two) times daily as needed. for anxiety   amLODipine 5 MG tablet Commonly known as: NORVASC TAKE 1 TABLET(5 MG) BY MOUTH DAILY   aspirin 81 MG EC tablet Take 81 mg by mouth every other day.   atorvastatin 20 MG tablet Commonly known as: LIPITOR TAKE 1 TABLET(20 MG) BY MOUTH DAILY   buPROPion 300 MG 24 hr tablet Commonly known as: Wellbutrin XL Take 1 tablet (300 mg total) by mouth daily.   Bystolic 5 MG tablet Generic drug: nebivolol TAKE 1 TABLET(5 MG) BY MOUTH DAILY   dexamethasone 4 MG tablet Commonly known as: DECADRON TAKE 3 TABLETS BY MOUTH 1 TIME A WEEK   dexamethasone 4 MG tablet Commonly known as: DECADRON Take 10 tablets ($RemoveBef'40mg'rPfsGwSQFO$ ) on day 22. Repeat every  28 days for 9 cycles. Take with breakfast.   escitalopram 20 MG tablet Commonly known as: LEXAPRO TAKE 1 TABLET(20 MG) BY MOUTH DAILY   famciclovir 250 MG tablet Commonly known as: FAMVIR TAKE 1 TABLET(250 MG) BY MOUTH DAILY   IRON PO Take by mouth daily.   lidocaine-prilocaine cream Commonly known as: EMLA Apply to affected area once   LORazepam 0.5 MG tablet Commonly known as: Ativan Take 1 tablet (0.5 mg total) by mouth every 6 (six) hours as needed (Nausea or vomiting).   montelukast 10 MG tablet Commonly known as: SINGULAIR TAKE 1 TABLET(10 MG) BY MOUTH AT BEDTIME. START TAKING ON 04/02/2020   ondansetron 8 MG tablet Commonly known as: Zofran Take 1 tablet (8 mg total) by mouth 2 (two) times daily as needed (Nausea or vomiting).   potassium chloride SA 20 MEQ tablet Commonly  known as: KLOR-CON TAKE 1 TABLET(20 MEQ) BY MOUTH DAILY   prochlorperazine 10 MG tablet Commonly known as: COMPAZINE TAKE 1 TABLET(10 MG) BY MOUTH EVERY 6 HOURS AS NEEDED FOR NAUSEA OR VOMITING   prochlorperazine 10 MG tablet Commonly known as: COMPAZINE Take 1 tablet (10 mg total) by mouth every 6 (six) hours as needed (Nausea or vomiting).   REFRESH DRY EYE THERAPY OP Apply to eye.   temazepam 7.5 MG capsule Commonly known as: RESTORIL TAKE 1 CAPSULE(7.5 MG) BY MOUTH AT BEDTIME AS NEEDED FOR SLEEP   Vitamin B-12 5000 MCG Tbdp Take by mouth daily.   Vitamin D 50 MCG (2000 UT) Caps Take 2,000 Units by mouth daily.       Allergies:  Allergies  Allergen Reactions  . Crestor [Rosuvastatin Calcium]     Causes liver functions to be elevated    Past Medical History, Surgical history, Social history, and Family History were reviewed and updated.  Review of Systems: Review of Systems  Constitutional: Negative.   HENT: Negative.   Eyes: Negative.   Respiratory: Negative.   Cardiovascular: Negative.   Gastrointestinal: Negative.   Genitourinary: Negative.   Musculoskeletal: Positive for back pain.  Skin: Negative.   Neurological: Negative.   Endo/Heme/Allergies: Negative.   Psychiatric/Behavioral: Negative.     Physical Exam:  vitals were not taken for this visit.   Wt Readings from Last 3 Encounters:  07/31/20 110 lb (49.9 kg)  07/05/20 108 lb (49 kg)  06/28/20 108 lb 1.9 oz (49 kg)    Physical Exam Vitals reviewed.  HENT:     Head: Normocephalic and atraumatic.  Eyes:     Pupils: Pupils are equal, round, and reactive to light.  Cardiovascular:     Rate and Rhythm: Normal rate and regular rhythm.     Heart sounds: Normal heart sounds.  Pulmonary:     Effort: Pulmonary effort is normal.     Breath sounds: Normal breath sounds.  Abdominal:     General: Bowel sounds are normal.     Palpations: Abdomen is soft.  Musculoskeletal:        General: No  tenderness or deformity. Normal range of motion.     Cervical back: Normal range of motion.  Lymphadenopathy:     Cervical: No cervical adenopathy.  Skin:    General: Skin is warm and dry.     Findings: No erythema or rash.  Neurological:     Mental Status: She is alert and oriented to person, place, and time.  Psychiatric:        Behavior: Behavior normal.  Thought Content: Thought content normal.        Judgment: Judgment normal.       Lab Results  Component Value Date   WBC 6.2 08/17/2020   HGB 13.1 08/17/2020   HCT 39.1 08/17/2020   MCV 98.2 08/17/2020   PLT 250 08/17/2020   Lab Results  Component Value Date   FERRITIN 128 10/06/2019   IRON 168 (H) 10/06/2019   TIBC 326 10/06/2019   UIBC 159 10/06/2019   IRONPCTSAT 51 10/06/2019   Lab Results  Component Value Date   RBC 3.98 08/17/2020   Lab Results  Component Value Date   KPAFRELGTCHN 100.0 (H) 06/28/2020   LAMBDASER 2.6 (L) 06/28/2020   KAPLAMBRATIO 38.46 (H) 06/28/2020   Lab Results  Component Value Date   IGGSERUM 1,310 06/28/2020   IGGSERUM 1,399 06/28/2020   IGA 8 (L) 06/28/2020   IGA 9 (L) 06/28/2020   IGMSERUM 41 06/28/2020   IGMSERUM 41 06/28/2020   Lab Results  Component Value Date   TOTALPROTELP 6.9 06/28/2020   ALBUMINELP 3.6 06/28/2020   A1GS 0.3 06/28/2020   A2GS 0.8 06/28/2020   BETS 1.0 06/28/2020   GAMS 1.2 06/28/2020   MSPIKE 0.9 (H) 06/28/2020   SPEI Comment 01/28/2018     Chemistry      Component Value Date/Time   NA 139 08/17/2020 0743   NA 148 (H) 07/16/2017 1128   NA 143 03/05/2017 1051   K 4.0 08/17/2020 0743   K 3.8 07/16/2017 1128   K 3.7 03/05/2017 1051   CL 105 08/17/2020 0743   CL 106 07/16/2017 1128   CO2 27 08/17/2020 0743   CO2 29 07/16/2017 1128   CO2 25 03/05/2017 1051   BUN 17 08/17/2020 0743   BUN 23 (H) 07/16/2017 1128   BUN 18.2 03/05/2017 1051   CREATININE 0.73 08/17/2020 0743   CREATININE 0.75 06/20/2020 1127   CREATININE 0.8 03/05/2017  1051      Component Value Date/Time   CALCIUM 9.2 08/17/2020 0743   CALCIUM 9.7 07/16/2017 1128   CALCIUM 8.9 03/05/2017 1051   ALKPHOS 72 08/17/2020 0743   ALKPHOS 71 07/16/2017 1128   ALKPHOS 67 03/05/2017 1051   AST 13 (L) 08/17/2020 0743   AST 15 03/05/2017 1051   ALT 13 08/17/2020 0743   ALT 23 07/16/2017 1128   ALT 17 03/05/2017 1051   BILITOT 0.7 08/17/2020 0743   BILITOT 0.55 03/05/2017 1051        Impression and Plan: Debra Ruiz is a very pleasant 75 yo caucasian female with IgG Kappa Myeloma.    Hopefully, we will see a response with the Kyprolis with the Faspro.  We will see what the myeloma numbers look like today.  I am just glad that her quality life is still doing well.  We will plan to get her back in another month.  I went over the bone marrow report with her.  I went over her labs.  Again, I just hate that we do not get a good marrow specimen on her.[name], MD 1/27/20228:22 AM

## 2020-08-18 ENCOUNTER — Ambulatory Visit: Payer: PPO

## 2020-08-18 LAB — IGG, IGA, IGM
IgA: <5 mg/dL — ABNORMAL LOW (ref 64–422)
IgG (Immunoglobin G), Serum: 715 mg/dL (ref 586–1602)
IgM (Immunoglobulin M), Srm: 22 mg/dL — ABNORMAL LOW (ref 26–217)

## 2020-08-18 LAB — KAPPA/LAMBDA LIGHT CHAINS
Kappa free light chain: 25.1 mg/L — ABNORMAL HIGH (ref 3.3–19.4)
Kappa, lambda light chain ratio: 14.76 — ABNORMAL HIGH (ref 0.26–1.65)
Lambda free light chains: 1.7 mg/L — ABNORMAL LOW (ref 5.7–26.3)

## 2020-08-21 ENCOUNTER — Encounter: Payer: Self-pay | Admitting: *Deleted

## 2020-08-22 LAB — PROTEIN ELECTROPHORESIS, SERUM, WITH REFLEX
A/G Ratio: 1.4 (ref 0.7–1.7)
Albumin ELP: 3.7 g/dL (ref 2.9–4.4)
Alpha-1-Globulin: 0.2 g/dL (ref 0.0–0.4)
Alpha-2-Globulin: 0.8 g/dL (ref 0.4–1.0)
Beta Globulin: 0.9 g/dL (ref 0.7–1.3)
Gamma Globulin: 0.7 g/dL (ref 0.4–1.8)
Globulin, Total: 2.6 g/dL (ref 2.2–3.9)
M-Spike, %: 0.5 g/dL — ABNORMAL HIGH
SPEP Interpretation: 0
Total Protein ELP: 6.3 g/dL (ref 6.0–8.5)

## 2020-08-22 LAB — IMMUNOFIXATION REFLEX, SERUM
IgA: <5 mg/dL — ABNORMAL LOW (ref 64–422)
IgG (Immunoglobin G), Serum: 864 mg/dL (ref 586–1602)
IgM (Immunoglobulin M), Srm: 26 mg/dL (ref 26–217)

## 2020-08-23 ENCOUNTER — Inpatient Hospital Stay: Payer: PPO | Attending: Hematology & Oncology

## 2020-08-23 ENCOUNTER — Other Ambulatory Visit: Payer: Self-pay

## 2020-08-23 ENCOUNTER — Inpatient Hospital Stay: Payer: PPO

## 2020-08-23 VITALS — BP 110/55 | HR 84 | Temp 98.0°F | Resp 18

## 2020-08-23 DIAGNOSIS — Z79899 Other long term (current) drug therapy: Secondary | ICD-10-CM | POA: Insufficient documentation

## 2020-08-23 DIAGNOSIS — Z5112 Encounter for antineoplastic immunotherapy: Secondary | ICD-10-CM | POA: Diagnosis not present

## 2020-08-23 DIAGNOSIS — C9000 Multiple myeloma not having achieved remission: Secondary | ICD-10-CM

## 2020-08-23 LAB — CBC WITH DIFFERENTIAL (CANCER CENTER ONLY)
Abs Immature Granulocytes: 0.08 10*3/uL — ABNORMAL HIGH (ref 0.00–0.07)
Basophils Absolute: 0 10*3/uL (ref 0.0–0.1)
Basophils Relative: 0 %
Eosinophils Absolute: 0 10*3/uL (ref 0.0–0.5)
Eosinophils Relative: 1 %
HCT: 37.4 % (ref 36.0–46.0)
Hemoglobin: 12.6 g/dL (ref 12.0–15.0)
Immature Granulocytes: 1 %
Lymphocytes Relative: 13 %
Lymphs Abs: 0.9 10*3/uL (ref 0.7–4.0)
MCH: 33 pg (ref 26.0–34.0)
MCHC: 33.7 g/dL (ref 30.0–36.0)
MCV: 97.9 fL (ref 80.0–100.0)
Monocytes Absolute: 1.1 10*3/uL — ABNORMAL HIGH (ref 0.1–1.0)
Monocytes Relative: 16 %
Neutro Abs: 4.9 10*3/uL (ref 1.7–7.7)
Neutrophils Relative %: 69 %
Platelet Count: 134 10*3/uL — ABNORMAL LOW (ref 150–400)
RBC: 3.82 MIL/uL — ABNORMAL LOW (ref 3.87–5.11)
RDW: 14 % (ref 11.5–15.5)
WBC Count: 7 10*3/uL (ref 4.0–10.5)
nRBC: 0.3 % — ABNORMAL HIGH (ref 0.0–0.2)

## 2020-08-23 MED ORDER — DARATUMUMAB-HYALURONIDASE-FIHJ 1800-30000 MG-UT/15ML ~~LOC~~ SOLN
1800.0000 mg | Freq: Once | SUBCUTANEOUS | Status: AC
Start: 2020-08-23 — End: 2020-08-23
  Administered 2020-08-23: 1800 mg via SUBCUTANEOUS
  Filled 2020-08-23: qty 15

## 2020-08-23 MED ORDER — ACETAMINOPHEN 325 MG PO TABS: 650.0000 mg | ORAL_TABLET | Freq: Once | ORAL | Status: DC

## 2020-08-23 MED ORDER — SODIUM CHLORIDE 0.9 % IV SOLN
40.0000 mg | Freq: Once | INTRAVENOUS | Status: AC
  Administered 2020-08-23: 40 mg via INTRAVENOUS
  Filled 2020-08-23: qty 4

## 2020-08-23 MED ORDER — DENOSUMAB 120 MG/1.7ML ~~LOC~~ SOLN
SUBCUTANEOUS | Status: AC
  Filled 2020-08-23: qty 1.7

## 2020-08-23 MED ORDER — CARFILZOMIB CHEMO INJECTION 60 MG
56.0000 mg/m2 | Freq: Once | INTRAVENOUS | Status: AC
  Administered 2020-08-23: 80 mg via INTRAVENOUS
  Filled 2020-08-23: qty 30

## 2020-08-23 MED ORDER — DENOSUMAB 120 MG/1.7ML ~~LOC~~ SOLN
120.0000 mg | Freq: Once | SUBCUTANEOUS | Status: AC
  Administered 2020-08-23: 120 mg via SUBCUTANEOUS

## 2020-08-23 MED ORDER — DIPHENHYDRAMINE HCL 25 MG PO CAPS: 25.0000 mg | ORAL_CAPSULE | Freq: Once | ORAL | Status: DC

## 2020-08-23 MED ORDER — SODIUM CHLORIDE 0.9 % IV SOLN
Freq: Once | INTRAVENOUS | Status: AC
Start: 2020-08-23 — End: 2020-08-23
  Filled 2020-08-23: qty 250

## 2020-08-23 NOTE — Patient Instructions (Signed)
Daratumumab injection What is this medicine? DARATUMUMAB (dar a toom ue mab) is a monoclonal antibody. It is used to treat multiple myeloma. This medicine may be used for other purposes; ask your health care provider or pharmacist if you have questions. COMMON BRAND NAME(S): DARZALEX What should I tell my health care provider before I take this medicine? They need to know if you have any of these conditions:  infection (especially a virus infection such as chickenpox, herpes, or hepatitis B virus)  lung or breathing disease  an unusual or allergic reaction to daratumumab, other medicines, foods, dyes, or preservatives  pregnant or trying to get pregnant  breast-feeding How should I use this medicine? This medicine is for infusion into a vein. It is given by a health care professional in a hospital or clinic setting. Talk to your pediatrician regarding the use of this medicine in children. Special care may be needed. Overdosage: If you think you have taken too much of this medicine contact a poison control center or emergency room at once. NOTE: This medicine is only for you. Do not share this medicine with others. What if I miss a dose? Keep appointments for follow-up doses as directed. It is important not to miss your dose. Call your doctor or health care professional if you are unable to keep an appointment. What may interact with this medicine? Interactions have not been studied. This list may not describe all possible interactions. Give your health care provider a list of all the medicines, herbs, non-prescription drugs, or dietary supplements you use. Also tell them if you smoke, drink alcohol, or use illegal drugs. Some items may interact with your medicine. What should I watch for while using this medicine? This drug may make you feel generally unwell. Report any side effects. Continue your course of treatment even though you feel ill unless your doctor tells you to stop. This  medicine can cause serious allergic reactions. To reduce your risk you may need to take medicine before treatment with this medicine. Take your medicine as directed. This medicine can affect the results of blood tests to match your blood type. These changes can last for up to 6 months after the final dose. Your healthcare provider will do blood tests to match your blood type before you start treatment. Tell all of your healthcare providers that you are being treated with this medicine before receiving a blood transfusion. This medicine can affect the results of some tests used to determine treatment response; extra tests may be needed to evaluate response. Do not become pregnant while taking this medicine or for 3 months after stopping it. Women should inform their doctor if they wish to become pregnant or think they might be pregnant. There is a potential for serious side effects to an unborn child. Talk to your health care professional or pharmacist for more information. What side effects may I notice from receiving this medicine? Side effects that you should report to your doctor or health care professional as soon as possible:  allergic reactions like skin rash, itching or hives, swelling of the face, lips, or tongue  breathing problems  chills  cough  dizziness  feeling faint or lightheaded  headache  low blood counts - this medicine may decrease the number of white blood cells, red blood cells and platelets. You may be at increased risk for infections and bleeding.  nausea, vomiting  shortness of breath  signs of decreased platelets or bleeding - bruising, pinpoint red spots on  the skin, black, tarry stools, blood in the urine  signs of decreased red blood cells - unusually weak or tired, feeling faint or lightheaded, falls  signs of infection - fever or chills, cough, sore throat, pain or difficulty passing urine  signs and symptoms of liver injury like dark yellow or brown  urine; general ill feeling or flu-like symptoms; light-colored stools; loss of appetite; right upper belly pain; unusually weak or tired; yellowing of the eyes or skin Side effects that usually do not require medical attention (report to your doctor or health care professional if they continue or are bothersome):  back pain  constipation  diarrhea  joint pain  muscle cramps  pain, tingling, numbness in the hands or feet  swelling of the ankles, feet, hands  tiredness  trouble sleeping This list may not describe all possible side effects. Call your doctor for medical advice about side effects. You may report side effects to FDA at 1-800-FDA-1088. Where should I keep my medicine? This drug is given in a hospital or clinic and will not be stored at home. NOTE: This sheet is a summary. It may not cover all possible information. If you have questions about this medicine, talk to your doctor, pharmacist, or health care provider.  2020 Elsevier/Gold Standard (2019-03-16 18:10:54) Carfilzomib injection What is this medicine? CARFILZOMIB (kar FILZ oh mib) targets a specific protein within cancer cells and stops the cancer cells from growing. It is used to treat multiple myeloma. This medicine may be used for other purposes; ask your health care provider or pharmacist if you have questions. COMMON BRAND NAME(S): KYPROLIS What should I tell my health care provider before I take this medicine? They need to know if you have any of these conditions:  heart disease  history of blood clots  irregular heartbeat  kidney disease  liver disease  lung or breathing disease  an unusual or allergic reaction to carfilzomib, or other medicines, foods, dyes, or preservatives  pregnant or trying to get pregnant  breast-feeding How should I use this medicine? This medicine is for injection or infusion into a vein. It is given by a health care professional in a hospital or clinic setting. Talk  to your pediatrician regarding the use of this medicine in children. Special care may be needed. Overdosage: If you think you have taken too much of this medicine contact a poison control center or emergency room at once. NOTE: This medicine is only for you. Do not share this medicine with others. What if I miss a dose? It is important not to miss your dose. Call your doctor or health care professional if you are unable to keep an appointment. What may interact with this medicine? Interactions are not expected. Give your health care provider a list of all the medicines, herbs, non-prescription drugs, or dietary supplements you use. Also tell them if you smoke, drink alcohol, or use illegal drugs. Some items may interact with your medicine. This list may not describe all possible interactions. Give your health care provider a list of all the medicines, herbs, non-prescription drugs, or dietary supplements you use. Also tell them if you smoke, drink alcohol, or use illegal drugs. Some items may interact with your medicine. What should I watch for while using this medicine? Your condition will be monitored carefully while you are receiving this medicine. Report any side effects. Continue your course of treatment even though you feel ill unless your doctor tells you to stop. You may need blood  work done while you are taking this medicine. Do not become pregnant while taking this medicine or for at least 6 months after stopping it. Women should inform their doctor if they wish to become pregnant or think they might be pregnant. There is a potential for serious side effects to an unborn child. Men should not father a child while taking this medicine and for at least 3 months after stopping it. Talk to your health care professional or pharmacist for more information. Do not breast-feed an infant while taking this medicine or for 2 weeks after the last dose. Check with your doctor or health care professional if  you get an attack of severe diarrhea, nausea and vomiting, or if you sweat a lot. The loss of too much body fluid can make it dangerous for you to take this medicine. You may get dizzy. Do not drive, use machinery, or do anything that needs mental alertness until you know how this medicine affects you. Do not stand or sit up quickly, especially if you are an older patient. This reduces the risk of dizzy or fainting spells. What side effects may I notice from receiving this medicine? Side effects that you should report to your doctor or health care professional as soon as possible:  allergic reactions like skin rash, itching or hives, swelling of the face, lips, or tongue  confusion  dizziness  feeling faint or lightheaded  fever or chills  palpitations  seizures  signs and symptoms of bleeding such as bloody or black, tarry stools; red or dark-brown urine; spitting up blood or brown material that looks like coffee grounds; red spots on the skin; unusual bruising or bleeding including from the eye, gums, or nose  signs and symptoms of a blood clot such as breathing problems; changes in vision; chest pain; severe, sudden headache; pain, swelling, warmth in the leg; trouble speaking; sudden numbness or weakness of the face, arm or leg  signs and symptoms of kidney injury like trouble passing urine or change in the amount of urine  signs and symptoms of liver injury like dark yellow or brown urine; general ill feeling or flu-like symptoms; light-colored stools; loss of appetite; nausea; right upper belly pain; unusually weak or tired; yellowing of the eyes or skin Side effects that usually do not require medical attention (report to your doctor or health care professional if they continue or are bothersome):  back pain  cough  diarrhea  headache  muscle cramps  trouble sleeping  vomiting This list may not describe all possible side effects. Call your doctor for medical advice  about side effects. You may report side effects to FDA at 1-800-FDA-1088. Where should I keep my medicine? This drug is given in a hospital or clinic and will not be stored at home. NOTE: This sheet is a summary. It may not cover all possible information. If you have questions about this medicine, talk to your doctor, pharmacist, or health care provider.  2020 Elsevier/Gold Standard (2019-03-15 19:44:21)

## 2020-08-23 NOTE — Progress Notes (Signed)
Okay to give Xgeva and Kyprolis without CMET today per Dr. Marin Olp. Last CMET was 1/27, will draw CMET with next treatment.

## 2020-08-28 ENCOUNTER — Other Ambulatory Visit: Payer: Self-pay | Admitting: Hematology & Oncology

## 2020-08-28 ENCOUNTER — Other Ambulatory Visit: Payer: Self-pay | Admitting: Internal Medicine

## 2020-08-28 DIAGNOSIS — C9000 Multiple myeloma not having achieved remission: Secondary | ICD-10-CM

## 2020-08-28 DIAGNOSIS — R5383 Other fatigue: Secondary | ICD-10-CM

## 2020-08-29 ENCOUNTER — Other Ambulatory Visit: Payer: Self-pay | Admitting: Hematology & Oncology

## 2020-08-29 DIAGNOSIS — C9000 Multiple myeloma not having achieved remission: Secondary | ICD-10-CM

## 2020-08-29 DIAGNOSIS — R5383 Other fatigue: Secondary | ICD-10-CM

## 2020-08-30 ENCOUNTER — Inpatient Hospital Stay: Payer: PPO

## 2020-08-30 ENCOUNTER — Other Ambulatory Visit: Payer: Self-pay

## 2020-08-30 VITALS — BP 154/65 | HR 74 | Temp 97.7°F | Resp 18

## 2020-08-30 DIAGNOSIS — C9000 Multiple myeloma not having achieved remission: Secondary | ICD-10-CM

## 2020-08-30 DIAGNOSIS — Z5112 Encounter for antineoplastic immunotherapy: Secondary | ICD-10-CM | POA: Diagnosis not present

## 2020-08-30 LAB — CBC WITH DIFFERENTIAL (CANCER CENTER ONLY)
Abs Immature Granulocytes: 0.13 10*3/uL — ABNORMAL HIGH (ref 0.00–0.07)
Basophils Absolute: 0 10*3/uL (ref 0.0–0.1)
Basophils Relative: 0 %
Eosinophils Absolute: 0.1 10*3/uL (ref 0.0–0.5)
Eosinophils Relative: 1 %
HCT: 36.5 % (ref 36.0–46.0)
Hemoglobin: 12.3 g/dL (ref 12.0–15.0)
Immature Granulocytes: 2 %
Lymphocytes Relative: 15 %
Lymphs Abs: 1.2 10*3/uL (ref 0.7–4.0)
MCH: 33.2 pg (ref 26.0–34.0)
MCHC: 33.7 g/dL (ref 30.0–36.0)
MCV: 98.6 fL (ref 80.0–100.0)
Monocytes Absolute: 1 10*3/uL (ref 0.1–1.0)
Monocytes Relative: 12 %
Neutro Abs: 5.6 10*3/uL (ref 1.7–7.7)
Neutrophils Relative %: 70 %
Platelet Count: 192 10*3/uL (ref 150–400)
RBC: 3.7 MIL/uL — ABNORMAL LOW (ref 3.87–5.11)
RDW: 14.5 % (ref 11.5–15.5)
WBC Count: 7.9 10*3/uL (ref 4.0–10.5)
nRBC: 0 % (ref 0.0–0.2)

## 2020-08-30 MED ORDER — DEXTROSE 5 % IV SOLN
56.0000 mg/m2 | Freq: Once | INTRAVENOUS | Status: AC
  Administered 2020-08-30: 80 mg via INTRAVENOUS
  Filled 2020-08-30: qty 10

## 2020-08-30 MED ORDER — DIPHENHYDRAMINE HCL 25 MG PO CAPS: 25.0000 mg | ORAL_CAPSULE | Freq: Once | ORAL | Status: DC

## 2020-08-30 MED ORDER — DARATUMUMAB-HYALURONIDASE-FIHJ 1800-30000 MG-UT/15ML ~~LOC~~ SOLN: 1800.0000 mg | Freq: Once | SUBCUTANEOUS | Status: DC

## 2020-08-30 MED ORDER — SODIUM CHLORIDE 0.9 % IV SOLN
40.0000 mg | Freq: Once | INTRAVENOUS | Status: AC
  Administered 2020-08-30: 40 mg via INTRAVENOUS
  Filled 2020-08-30: qty 4

## 2020-08-30 MED ORDER — SODIUM CHLORIDE 0.9 % IV SOLN
Freq: Once | INTRAVENOUS | Status: AC
  Filled 2020-08-30: qty 250

## 2020-08-30 MED ORDER — SODIUM CHLORIDE 0.9 % IV SOLN
Freq: Once | INTRAVENOUS | Status: AC
Start: 2020-08-30 — End: 2020-08-30
  Filled 2020-08-30: qty 250

## 2020-08-30 MED ORDER — ACETAMINOPHEN 325 MG PO TABS: 650.0000 mg | ORAL_TABLET | Freq: Once | ORAL | Status: DC

## 2020-08-30 NOTE — Progress Notes (Signed)
Per dr Marin Olp, okay to treat today without a c-met.

## 2020-08-30 NOTE — Patient Instructions (Signed)
Carfilzomib injection What is this medicine? CARFILZOMIB (kar FILZ oh mib) targets a specific protein within cancer cells and stops the cancer cells from growing. It is used to treat multiple myeloma. This medicine may be used for other purposes; ask your health care provider or pharmacist if you have questions. COMMON BRAND NAME(S): KYPROLIS What should I tell my health care provider before I take this medicine? They need to know if you have any of these conditions:  heart disease  history of blood clots  irregular heartbeat  kidney disease  liver disease  lung or breathing disease  an unusual or allergic reaction to carfilzomib, or other medicines, foods, dyes, or preservatives  pregnant or trying to get pregnant  breast-feeding How should I use this medicine? This medicine is for injection or infusion into a vein. It is given by a health care professional in a hospital or clinic setting. Talk to your pediatrician regarding the use of this medicine in children. Special care may be needed. Overdosage: If you think you have taken too much of this medicine contact a poison control center or emergency room at once. NOTE: This medicine is only for you. Do not share this medicine with others. What if I miss a dose? It is important not to miss your dose. Call your doctor or health care professional if you are unable to keep an appointment. What may interact with this medicine? Interactions are not expected. Give your health care provider a list of all the medicines, herbs, non-prescription drugs, or dietary supplements you use. Also tell them if you smoke, drink alcohol, or use illegal drugs. Some items may interact with your medicine. This list may not describe all possible interactions. Give your health care provider a list of all the medicines, herbs, non-prescription drugs, or dietary supplements you use. Also tell them if you smoke, drink alcohol, or use illegal drugs. Some items  may interact with your medicine. What should I watch for while using this medicine? Your condition will be monitored carefully while you are receiving this medicine. Report any side effects. Continue your course of treatment even though you feel ill unless your doctor tells you to stop. You may need blood work done while you are taking this medicine. Do not become pregnant while taking this medicine or for at least 6 months after stopping it. Women should inform their doctor if they wish to become pregnant or think they might be pregnant. There is a potential for serious side effects to an unborn child. Men should not father a child while taking this medicine and for at least 3 months after stopping it. Talk to your health care professional or pharmacist for more information. Do not breast-feed an infant while taking this medicine or for 2 weeks after the last dose. Check with your doctor or health care professional if you get an attack of severe diarrhea, nausea and vomiting, or if you sweat a lot. The loss of too much body fluid can make it dangerous for you to take this medicine. You may get dizzy. Do not drive, use machinery, or do anything that needs mental alertness until you know how this medicine affects you. Do not stand or sit up quickly, especially if you are an older patient. This reduces the risk of dizzy or fainting spells. What side effects may I notice from receiving this medicine? Side effects that you should report to your doctor or health care professional as soon as possible:  allergic reactions like skin  rash, itching or hives, swelling of the face, lips, or tongue  confusion  dizziness  feeling faint or lightheaded  fever or chills  palpitations  seizures  signs and symptoms of bleeding such as bloody or black, tarry stools; red or dark-brown urine; spitting up blood or brown material that looks like coffee grounds; red spots on the skin; unusual bruising or bleeding  including from the eye, gums, or nose  signs and symptoms of a blood clot such as breathing problems; changes in vision; chest pain; severe, sudden headache; pain, swelling, warmth in the leg; trouble speaking; sudden numbness or weakness of the face, arm or leg  signs and symptoms of kidney injury like trouble passing urine or change in the amount of urine  signs and symptoms of liver injury like dark yellow or brown urine; general ill feeling or flu-like symptoms; light-colored stools; loss of appetite; nausea; right upper belly pain; unusually weak or tired; yellowing of the eyes or skin Side effects that usually do not require medical attention (report to your doctor or health care professional if they continue or are bothersome):  back pain  cough  diarrhea  headache  muscle cramps  trouble sleeping  vomiting This list may not describe all possible side effects. Call your doctor for medical advice about side effects. You may report side effects to FDA at 1-800-FDA-1088. Where should I keep my medicine? This drug is given in a hospital or clinic and will not be stored at home. NOTE: This sheet is a summary. It may not cover all possible information. If you have questions about this medicine, talk to your doctor, pharmacist, or health care provider.  2021 Elsevier/Gold Standard (2019-03-15 19:44:21)

## 2020-08-30 NOTE — Progress Notes (Signed)
Per Dr. Marin Olp, Darzalex Huel Cote will now be q28d.

## 2020-09-13 ENCOUNTER — Other Ambulatory Visit: Payer: Self-pay

## 2020-09-13 ENCOUNTER — Inpatient Hospital Stay: Payer: PPO

## 2020-09-13 ENCOUNTER — Encounter: Payer: Self-pay | Admitting: Hematology & Oncology

## 2020-09-13 ENCOUNTER — Inpatient Hospital Stay: Payer: PPO | Admitting: Hematology & Oncology

## 2020-09-13 VITALS — BP 128/51 | HR 78 | Temp 99.1°F | Resp 19 | Wt 112.0 lb

## 2020-09-13 DIAGNOSIS — Z5112 Encounter for antineoplastic immunotherapy: Secondary | ICD-10-CM | POA: Diagnosis not present

## 2020-09-13 DIAGNOSIS — C9000 Multiple myeloma not having achieved remission: Secondary | ICD-10-CM

## 2020-09-13 LAB — CBC WITH DIFFERENTIAL (CANCER CENTER ONLY)
Abs Immature Granulocytes: 0.03 10*3/uL (ref 0.00–0.07)
Basophils Absolute: 0 10*3/uL (ref 0.0–0.1)
Basophils Relative: 0 %
Eosinophils Absolute: 0.1 10*3/uL (ref 0.0–0.5)
Eosinophils Relative: 1 %
HCT: 33.8 % — ABNORMAL LOW (ref 36.0–46.0)
Hemoglobin: 11.4 g/dL — ABNORMAL LOW (ref 12.0–15.0)
Immature Granulocytes: 1 %
Lymphocytes Relative: 17 %
Lymphs Abs: 0.8 10*3/uL (ref 0.7–4.0)
MCH: 34 pg (ref 26.0–34.0)
MCHC: 33.7 g/dL (ref 30.0–36.0)
MCV: 100.9 fL — ABNORMAL HIGH (ref 80.0–100.0)
Monocytes Absolute: 0.5 10*3/uL (ref 0.1–1.0)
Monocytes Relative: 11 %
Neutro Abs: 3.4 10*3/uL (ref 1.7–7.7)
Neutrophils Relative %: 70 %
Platelet Count: 274 10*3/uL (ref 150–400)
RBC: 3.35 MIL/uL — ABNORMAL LOW (ref 3.87–5.11)
RDW: 16.1 % — ABNORMAL HIGH (ref 11.5–15.5)
WBC Count: 4.8 10*3/uL (ref 4.0–10.5)
nRBC: 0 % (ref 0.0–0.2)

## 2020-09-13 LAB — CMP (CANCER CENTER ONLY)
ALT: 13 U/L (ref 0–44)
AST: 15 U/L (ref 15–41)
Albumin: 4 g/dL (ref 3.5–5.0)
Alkaline Phosphatase: 64 U/L (ref 38–126)
Anion gap: 9 (ref 5–15)
BUN: 19 mg/dL (ref 8–23)
CO2: 24 mmol/L (ref 22–32)
Calcium: 9.1 mg/dL (ref 8.9–10.3)
Chloride: 107 mmol/L (ref 98–111)
Creatinine: 0.69 mg/dL (ref 0.44–1.00)
GFR, Estimated: >60 mL/min (ref >60)
Glucose, Bld: 131 mg/dL — ABNORMAL HIGH (ref 70–99)
Potassium: 3.8 mmol/L (ref 3.5–5.1)
Sodium: 140 mmol/L (ref 135–145)
Total Bilirubin: 0.7 mg/dL (ref 0.3–1.2)
Total Protein: 6 g/dL — ABNORMAL LOW (ref 6.5–8.1)

## 2020-09-13 LAB — LACTATE DEHYDROGENASE: LDH: 246 U/L — ABNORMAL HIGH (ref 98–192)

## 2020-09-13 MED ORDER — SODIUM CHLORIDE 0.9 % IV SOLN
Freq: Once | INTRAVENOUS | Status: AC
  Filled 2020-09-13: qty 250

## 2020-09-13 MED ORDER — SODIUM CHLORIDE 0.9 % IV SOLN
40.0000 mg | Freq: Once | INTRAVENOUS | Status: AC
  Administered 2020-09-13: 40 mg via INTRAVENOUS
  Filled 2020-09-13: qty 4

## 2020-09-13 MED ORDER — DEXTROSE 5 % IV SOLN
70.0000 mg/m2 | Freq: Once | INTRAVENOUS | Status: AC
  Administered 2020-09-13: 100 mg via INTRAVENOUS
  Filled 2020-09-13: qty 30

## 2020-09-13 MED ORDER — DARATUMUMAB-HYALURONIDASE-FIHJ 1800-30000 MG-UT/15ML ~~LOC~~ SOLN
1800.0000 mg | Freq: Once | SUBCUTANEOUS | Status: AC
  Administered 2020-09-13: 1800 mg via SUBCUTANEOUS
  Filled 2020-09-13: qty 15

## 2020-09-13 NOTE — Progress Notes (Signed)
Hematology and Oncology Follow Up Visit  Debra Ruiz [id number] [birth year] 75 y.o. 09/13/2020   Principle Diagnosis:  IgG Kappa myeloma - compression fractures - 13q-, +11, 14q+ - hyperdiploid -- progressive Iron def anemia  Completed Therapy: S/p Kyphoplasty of T11 and T12 XRT to lower spine-completed on 09/25/2016  Current Therapy:    Faspro -- start on 04/05/2020 -- s/p cycle #4 -- Kyprolis added on 07/20/2020 -- s/p cycle #2 Ninlaro 2.3 mg po q week (3 on/1 off) -- start on 01/24/2020 -- d/c on 03/29/2020 due to toxicity Velcade/Pomalyst/Decadron -Start on 09/02/2018 --Pomalyst on hold since 11/09/2018.  D/C on 01/17/2020 Xgeva 134m Sq q  3 month - next dose in 11/2020  IV Injectafer -- last dose given on 11/2018   Interim History:  [name] here today for follow-up.  She has responded nicely to the Faspro/Kyprolis combination.  Her M spike went down to 0.5 g/dL.  I am just very happy about this.  Her IgG level went down to 780 mg/dL.  The Kappa light chain went down to 2.5 mg/dL.  She tolerated treatment well.  She has had no nausea or vomiting.  She does have some muscle cramps.  I told her to try some magnesium over-the-counter.  She does have occasional diarrhea after treatment.  She has had a good appetite.  There is been no cough or shortness of breath.  She is being very careful with the coronavirus.  She has had no rashes.  Her neck is doing okay.  She has had neck surgery in the past.  She has had no headache.  Overall, I would say performance status is ECOG 1.  Medications:  Allergies as of 09/13/2020      Reactions   Crestor [rosuvastatin Calcium]    Causes liver functions to be elevated      Medication List       Accurate as of September 13, 2020  8:26 AM. If you have any questions, ask your nurse or doctor.        STOP taking these medications   Tyrvaya 0.03 MG/ACT Soln Generic drug: Varenicline Tartrate Stopped by: PVolanda Napoleon MD      TAKE these medications   ALPRAZolam 0.25 MG tablet Commonly known as: XANAX Take 1 tablet (0.25 mg total) by mouth 2 (two) times daily as needed. for anxiety   amLODipine 5 MG tablet Commonly known as: NORVASC TAKE 1 TABLET(5 MG) BY MOUTH DAILY   aspirin 81 MG EC tablet Take 81 mg by mouth every other day.   atorvastatin 20 MG tablet Commonly known as: LIPITOR TAKE 1 TABLET(20 MG) BY MOUTH DAILY   buPROPion 300 MG 24 hr tablet Commonly known as: Wellbutrin XL Take 1 tablet (300 mg total) by mouth daily.   Bystolic 5 MG tablet Generic drug: nebivolol TAKE 1 TABLET(5 MG) BY MOUTH DAILY   dexamethasone 4 MG tablet Commonly known as: DECADRON TAKE 3 TABLETS BY MOUTH 1 TIME A WEEK   escitalopram 20 MG tablet Commonly known as: LEXAPRO TAKE 1 TABLET(20 MG) BY MOUTH DAILY   famciclovir 250 MG tablet Commonly known as: FAMVIR TAKE 1 TABLET(250 MG) BY MOUTH DAILY   IRON PO Take by mouth daily.   lidocaine-prilocaine cream Commonly known as: EMLA Apply to affected area once   LORazepam 0.5 MG tablet Commonly known as: Ativan Take 1 tablet (0.5 mg total) by mouth every 6 (six) hours as needed (Nausea or vomiting).   montelukast 10 MG  tablet Commonly known as: SINGULAIR TAKE 1 TABLET(10 MG) BY MOUTH AT BEDTIME. START TAKING ON 04/02/2020   ondansetron 8 MG tablet Commonly known as: Zofran Take 1 tablet (8 mg total) by mouth 2 (two) times daily as needed (Nausea or vomiting).   potassium chloride SA 20 MEQ tablet Commonly known as: KLOR-CON TAKE 1 TABLET(20 MEQ) BY MOUTH DAILY   prochlorperazine 10 MG tablet Commonly known as: COMPAZINE TAKE 1 TABLET(10 MG) BY MOUTH EVERY 6 HOURS AS NEEDED FOR NAUSEA OR VOMITING   prochlorperazine 10 MG tablet Commonly known as: COMPAZINE Take 1 tablet (10 mg total) by mouth every 6 (six) hours as needed (Nausea or vomiting).   REFRESH DRY EYE THERAPY OP Apply to eye.   temazepam 7.5 MG capsule Commonly known as:  RESTORIL TAKE 1 CAPSULE(7.5 MG) BY MOUTH AT BEDTIME AS NEEDED FOR SLEEP   Vitamin B-12 5000 MCG Tbdp Take by mouth daily.   Vitamin D 50 MCG (2000 UT) Caps Take 2,000 Units by mouth daily.       Allergies:  Allergies  Allergen Reactions  . Crestor [Rosuvastatin Calcium]     Causes liver functions to be elevated    Past Medical History, Surgical history, Social history, and Family History were reviewed and updated.  Review of Systems: Review of Systems  Constitutional: Negative.   HENT: Negative.   Eyes: Negative.   Respiratory: Negative.   Cardiovascular: Negative.   Gastrointestinal: Negative.   Genitourinary: Negative.   Musculoskeletal: Positive for back pain.  Skin: Negative.   Neurological: Negative.   Endo/Heme/Allergies: Negative.   Psychiatric/Behavioral: Negative.     Physical Exam:  vitals were not taken for this visit.   Wt Readings from Last 3 Encounters:  08/17/20 110 lb (49.9 kg)  07/31/20 110 lb (49.9 kg)  07/05/20 108 lb (49 kg)    Physical Exam Vitals reviewed.  HENT:     Head: Normocephalic and atraumatic.  Eyes:     Pupils: Pupils are equal, round, and reactive to light.  Cardiovascular:     Rate and Rhythm: Normal rate and regular rhythm.     Heart sounds: Normal heart sounds.  Pulmonary:     Effort: Pulmonary effort is normal.     Breath sounds: Normal breath sounds.  Abdominal:     General: Bowel sounds are normal.     Palpations: Abdomen is soft.  Musculoskeletal:        General: No tenderness or deformity. Normal range of motion.     Cervical back: Normal range of motion.  Lymphadenopathy:     Cervical: No cervical adenopathy.  Skin:    General: Skin is warm and dry.     Findings: No erythema or rash.  Neurological:     Mental Status: She is alert and oriented to person, place, and time.  Psychiatric:        Behavior: Behavior normal.        Thought Content: Thought content normal.        Judgment: Judgment normal.        Lab Results  Component Value Date   WBC 4.8 09/13/2020   HGB 11.4 (L) 09/13/2020   HCT 33.8 (L) 09/13/2020   MCV 100.9 (H) 09/13/2020   PLT 274 09/13/2020   Lab Results  Component Value Date   FERRITIN 128 10/06/2019   IRON 168 (H) 10/06/2019   TIBC 326 10/06/2019   UIBC 159 10/06/2019   IRONPCTSAT 51 10/06/2019   Lab Results  Component Value  Date   RBC 3.35 (L) 09/13/2020   Lab Results  Component Value Date   KPAFRELGTCHN 25.1 (H) 08/17/2020   LAMBDASER 1.7 (L) 08/17/2020   KAPLAMBRATIO 14.76 (H) 08/17/2020   Lab Results  Component Value Date   IGGSERUM 715 08/17/2020   IGGSERUM 864 08/17/2020   IGA <5 (L) 08/17/2020   IGA <5 (L) 08/17/2020   IGMSERUM 22 (L) 08/17/2020   IGMSERUM 26 08/17/2020   Lab Results  Component Value Date   TOTALPROTELP 6.3 08/17/2020   ALBUMINELP 3.7 08/17/2020   A1GS 0.2 08/17/2020   A2GS 0.8 08/17/2020   BETS 0.9 08/17/2020   GAMS 0.7 08/17/2020   MSPIKE 0.5 (H) 08/17/2020   SPEI Comment 01/28/2018     Chemistry      Component Value Date/Time   NA 140 09/13/2020 0746   NA 148 (H) 07/16/2017 1128   NA 143 03/05/2017 1051   K 3.8 09/13/2020 0746   K 3.8 07/16/2017 1128   K 3.7 03/05/2017 1051   CL 107 09/13/2020 0746   CL 106 07/16/2017 1128   CO2 24 09/13/2020 0746   CO2 29 07/16/2017 1128   CO2 25 03/05/2017 1051   BUN 19 09/13/2020 0746   BUN 23 (H) 07/16/2017 1128   BUN 18.2 03/05/2017 1051   CREATININE 0.69 09/13/2020 0746   CREATININE 0.75 06/20/2020 1127   CREATININE 0.8 03/05/2017 1051      Component Value Date/Time   CALCIUM 9.1 09/13/2020 0746   CALCIUM 9.7 07/16/2017 1128   CALCIUM 8.9 03/05/2017 1051   ALKPHOS 64 09/13/2020 0746   ALKPHOS 71 07/16/2017 1128   ALKPHOS 67 03/05/2017 1051   AST 15 09/13/2020 0746   AST 15 03/05/2017 1051   ALT 13 09/13/2020 0746   ALT 23 07/16/2017 1128   ALT 17 03/05/2017 1051   BILITOT 0.7 09/13/2020 0746   BILITOT 0.55 03/05/2017 1051        Impression  and Plan: Ms. Kiernan is a very pleasant 75 yo caucasian female with IgG Kappa Myeloma.    We clearly have seen a response with Faspro/Kyprolis combination.  She tolerated this well.  As always, she would like to decrease the frequency of her treatments.  Hopefully if we still see a response, we can decrease the Kyprolis frequency.  We will continue to follow her along monthly.  She will get her Xgeva.  [name], MD 2/23/20228:26 AM

## 2020-09-13 NOTE — Patient Instructions (Signed)
Dexamethasone injection What is this medicine? DEXAMETHASONE (dex a METH a sone) is a corticosteroid. It is used to treat inflammation of the skin, joints, lungs, and other organs. Common conditions treated include asthma, allergies, and arthritis. It is also used for other conditions, like blood disorders and diseases of the adrenal glands. This medicine may be used for other purposes; ask your health care provider or pharmacist if you have questions. COMMON BRAND NAME(S): Decadron, DoubleDex, ReadySharp Dexamethasone, Simplist Dexamethasone, Solurex What should I tell my health care provider before I take this medicine? They need to know if you have any of these conditions:  Cushing's syndrome  diabetes  glaucoma  heart disease  high blood pressure  infection like herpes, measles, tuberculosis, or chickenpox  kidney disease  liver disease  mental illness  myasthenia gravis  osteoporosis  previous heart attack  seizures  stomach or intestine problems  thyroid disease  an unusual or allergic reaction to dexamethasone, corticosteroids, other medicines, lactose, foods, dyes, or preservatives  pregnant or trying to get pregnant  breast-feeding How should I use this medicine? This medicine is for injection into a muscle, joint, lesion, soft tissue, or vein. It is given by a health care professional in a hospital or clinic setting. Talk to your pediatrician regarding the use of this medicine in children. Special care may be needed. Overdosage: If you think you have taken too much of this medicine contact a poison control center or emergency room at once. NOTE: This medicine is only for you. Do not share this medicine with others. What if I miss a dose? This may not apply. If you are having a series of injections over a prolonged period, try not to miss an appointment. Call your doctor or health care professional to reschedule if you are unable to keep an appointment. What  may interact with this medicine? Do not take this medicine with any of the following medications:  live virus vaccines This medicine may also interact with the following medications:  aminoglutethimide  amphotericin B  aspirin and aspirin-like medicines  certain antibiotics like erythromycin, clarithromycin, and troleandomycin  certain antivirals for HIV or hepatitis  certain medicines for seizures like carbamazepine, phenobarbital, phenytoin  certain medicines to treat myasthenia gravis  cholestyramine  cyclosporine  digoxin  diuretics  ephedrine  female hormones, like estrogen or progestins and birth control pills  insulin or other medicines for diabetes  isoniazid  ketoconazole  medicines that relax muscles for surgery  mifepristone  NSAIDs, medicines for pain and inflammation, like ibuprofen or naproxen  rifampin  skin tests for allergies  thalidomide  vaccines  warfarin This list may not describe all possible interactions. Give your health care provider a list of all the medicines, herbs, non-prescription drugs, or dietary supplements you use. Also tell them if you smoke, drink alcohol, or use illegal drugs. Some items may interact with your medicine. What should I watch for while using this medicine? Visit your health care professional for regular checks on your progress. Tell your health care professional if your symptoms do not start to get better or if they get worse. Your condition will be monitored carefully while you are receiving this medicine. Wear a medical ID bracelet or chain. Carry a card that describes your disease and details of your medicine and dosage times. This medicine may increase your risk of getting an infection. Call your health care professional for advice if you get a fever, chills, or sore throat, or other symptoms of a  cold or flu. Do not treat yourself. Try to avoid being around people who are sick. Call your health care  professional if you are around anyone with measles, chickenpox, or if you develop sores or blisters that do not heal properly. If you are going to need surgery or other procedures, tell your doctor or health care professional that you have taken this medicine within the last 12 months. Ask your doctor or health care professional about your diet. You may need to lower the amount of salt you eat. This medicine may increase blood sugar. Ask your healthcare provider if changes in diet or medicines are needed if you have diabetes. What side effects may I notice from receiving this medicine? Side effects that you should report to your doctor or health care professional as soon as possible:  allergic reactions like skin rash, itching or hives, swelling of the face, lips, or tongue  bloody or black, tarry stools  changes in emotions or moods  changes in vision  confusion, excitement, restlessness  depressed mood  eye pain  hallucinations  muscle weakness  severe or sudden stomach or belly pain  signs and symptoms of high blood sugar such as being more thirsty or hungry or having to urinate more than normal. You may also feel very tired or have blurry vision.  signs and symptoms of infection like fever; chills; cough; sore throat; pain or trouble passing urine  swelling of ankles, feet  unusual bruising or bleeding  wounds that do not heal Side effects that usually do not require medical attention (report to your doctor or health care professional if they continue or are bothersome):  increased appetite  increased growth of face or body hair  headache  nausea, vomiting  pain, redness, or irritation at site where injected  skin problems, acne, thin and shiny skin  trouble sleeping  weight gain This list may not describe all possible side effects. Call your doctor for medical advice about side effects. You may report side effects to FDA at 1-800-FDA-1088. Where should I  keep my medicine? This medicine is given in a hospital or clinic and will not be stored at home. NOTE: This sheet is a summary. It may not cover all possible information. If you have questions about this medicine, talk to your doctor, pharmacist, or health care provider.  2021 Elsevier/Gold Standard (2019-01-19 13:51:58) Daratumumab injection What is this medicine? DARATUMUMAB (dar a toom ue mab) is a monoclonal antibody. It is used to treat multiple myeloma. This medicine may be used for other purposes; ask your health care provider or pharmacist if you have questions. COMMON BRAND NAME(S): DARZALEX What should I tell my health care provider before I take this medicine? They need to know if you have any of these conditions:  hereditary fructose intolerance  infection (especially a virus infection such as chickenpox, herpes, or hepatitis B virus)  lung or breathing disease (asthma, COPD)  an unusual or allergic reaction to daratumumab, sorbitol, other medicines, foods, dyes, or preservatives  pregnant or trying to get pregnant  breast-feeding How should I use this medicine? This medicine is for infusion into a vein. It is given by a health care professional in a hospital or clinic setting. Talk to your pediatrician regarding the use of this medicine in children. Special care may be needed. Overdosage: If you think you have taken too much of this medicine contact a poison control center or emergency room at once. NOTE: This medicine is only for you.  Do not share this medicine with others. What if I miss a dose? Keep appointments for follow-up doses as directed. It is important not to miss your dose. Call your doctor or health care professional if you are unable to keep an appointment. What may interact with this medicine? Interactions have not been studied. This list may not describe all possible interactions. Give your health care provider a list of all the medicines, herbs,  non-prescription drugs, or dietary supplements you use. Also tell them if you smoke, drink alcohol, or use illegal drugs. Some items may interact with your medicine. What should I watch for while using this medicine? Your condition will be monitored carefully while you are receiving this medicine. This medicine can cause serious allergic reactions. To reduce your risk, your health care provider may give you other medicine to take before receiving this one. Be sure to follow the directions from your health care provider. This medicine can affect the results of blood tests to match your blood type. These changes can last for up to 6 months after the final dose. Your healthcare provider will do blood tests to match your blood type before you start treatment. Tell all of your healthcare providers that you are being treated with this medicine before receiving a blood transfusion. This medicine can affect the results of some tests used to determine treatment response; extra tests may be needed to evaluate response. Do not become pregnant while taking this medicine or for 3 months after stopping it. Women should inform their health care provider if they wish to become pregnant or think they might be pregnant. There is a potential for serious side effects to an unborn child. Talk to your health care provider for more information. Do not breast-feed an infant while taking this medicine. What side effects may I notice from receiving this medicine? Side effects that you should report to your doctor or health care professional as soon as possible:  allergic reactions (skin rash; itching or hives; swelling of the face, lips, or tongue)  infection (fever, chills, cough, sore throat, pain or difficulty passing urine)  infusion reaction (dizziness, fast heartbeat, feeling faint or lightheaded, falls, headache, increase in blood pressure, nausea, vomiting, or wheezing or trouble breathing with loud or whistling  sounds)  unusual bleeding or bruising Side effects that usually do not require medical attention (report to your doctor or health care professional if they continue or are bothersome):  constipation  diarrhea  pain, tingling, numbness in the hands or feet  swelling of the ankles, feet, hands  tiredness This list may not describe all possible side effects. Call your doctor for medical advice about side effects. You may report side effects to FDA at 1-800-FDA-1088. Where should I keep my medicine? This drug is given in a hospital or clinic and will not be stored at home. NOTE: This sheet is a summary. It may not cover all possible information. If you have questions about this medicine, talk to your doctor, pharmacist, or health care provider.  2021 Elsevier/Gold Standard (2020-06-29 13:28:52) Carfilzomib injection What is this medicine? CARFILZOMIB (kar FILZ oh mib) targets a specific protein within cancer cells and stops the cancer cells from growing. It is used to treat multiple myeloma. This medicine may be used for other purposes; ask your health care provider or pharmacist if you have questions. COMMON BRAND NAME(S): KYPROLIS What should I tell my health care provider before I take this medicine? They need to know if you have any  of these conditions:  heart disease  history of blood clots  irregular heartbeat  kidney disease  liver disease  lung or breathing disease  an unusual or allergic reaction to carfilzomib, or other medicines, foods, dyes, or preservatives  pregnant or trying to get pregnant  breast-feeding How should I use this medicine? This medicine is for injection or infusion into a vein. It is given by a health care professional in a hospital or clinic setting. Talk to your pediatrician regarding the use of this medicine in children. Special care may be needed. Overdosage: If you think you have taken too much of this medicine contact a poison control  center or emergency room at once. NOTE: This medicine is only for you. Do not share this medicine with others. What if I miss a dose? It is important not to miss your dose. Call your doctor or health care professional if you are unable to keep an appointment. What may interact with this medicine? Interactions are not expected. Give your health care provider a list of all the medicines, herbs, non-prescription drugs, or dietary supplements you use. Also tell them if you smoke, drink alcohol, or use illegal drugs. Some items may interact with your medicine. This list may not describe all possible interactions. Give your health care provider a list of all the medicines, herbs, non-prescription drugs, or dietary supplements you use. Also tell them if you smoke, drink alcohol, or use illegal drugs. Some items may interact with your medicine. What should I watch for while using this medicine? Your condition will be monitored carefully while you are receiving this medicine. Report any side effects. Continue your course of treatment even though you feel ill unless your doctor tells you to stop. You may need blood work done while you are taking this medicine. Do not become pregnant while taking this medicine or for at least 6 months after stopping it. Women should inform their doctor if they wish to become pregnant or think they might be pregnant. There is a potential for serious side effects to an unborn child. Men should not father a child while taking this medicine and for at least 3 months after stopping it. Talk to your health care professional or pharmacist for more information. Do not breast-feed an infant while taking this medicine or for 2 weeks after the last dose. Check with your doctor or health care professional if you get an attack of severe diarrhea, nausea and vomiting, or if you sweat a lot. The loss of too much body fluid can make it dangerous for you to take this medicine. You may get dizzy.  Do not drive, use machinery, or do anything that needs mental alertness until you know how this medicine affects you. Do not stand or sit up quickly, especially if you are an older patient. This reduces the risk of dizzy or fainting spells. What side effects may I notice from receiving this medicine? Side effects that you should report to your doctor or health care professional as soon as possible:  allergic reactions like skin rash, itching or hives, swelling of the face, lips, or tongue  confusion  dizziness  feeling faint or lightheaded  fever or chills  palpitations  seizures  signs and symptoms of bleeding such as bloody or black, tarry stools; red or dark-brown urine; spitting up blood or brown material that looks like coffee grounds; red spots on the skin; unusual bruising or bleeding including from the eye, gums, or nose  signs and  symptoms of a blood clot such as breathing problems; changes in vision; chest pain; severe, sudden headache; pain, swelling, warmth in the leg; trouble speaking; sudden numbness or weakness of the face, arm or leg  signs and symptoms of kidney injury like trouble passing urine or change in the amount of urine  signs and symptoms of liver injury like dark yellow or brown urine; general ill feeling or flu-like symptoms; light-colored stools; loss of appetite; nausea; right upper belly pain; unusually weak or tired; yellowing of the eyes or skin Side effects that usually do not require medical attention (report to your doctor or health care professional if they continue or are bothersome):  back pain  cough  diarrhea  headache  muscle cramps  trouble sleeping  vomiting This list may not describe all possible side effects. Call your doctor for medical advice about side effects. You may report side effects to FDA at 1-800-FDA-1088. Where should I keep my medicine? This drug is given in a hospital or clinic and will not be stored at home. NOTE:  This sheet is a summary. It may not cover all possible information. If you have questions about this medicine, talk to your doctor, pharmacist, or health care provider.  2021 Elsevier/Gold Standard (2019-03-15 19:44:21)

## 2020-09-14 LAB — IGG, IGA, IGM
IgA: <5 mg/dL — ABNORMAL LOW (ref 64–422)
IgG (Immunoglobin G), Serum: 503 mg/dL — ABNORMAL LOW (ref 586–1602)
IgM (Immunoglobulin M), Srm: 15 mg/dL — ABNORMAL LOW (ref 26–217)

## 2020-09-14 LAB — KAPPA/LAMBDA LIGHT CHAINS
Kappa free light chain: 15.1 mg/L (ref 3.3–19.4)
Kappa, lambda light chain ratio: >10.07 — ABNORMAL HIGH (ref 0.26–1.65)
Lambda free light chains: <1.5 mg/L — ABNORMAL LOW (ref 5.7–26.3)

## 2020-09-18 LAB — PROTEIN ELECTROPHORESIS, SERUM, WITH REFLEX
A/G Ratio: 1.4 (ref 0.7–1.7)
Albumin ELP: 3.3 g/dL (ref 2.9–4.4)
Alpha-1-Globulin: 0.3 g/dL (ref 0.0–0.4)
Alpha-2-Globulin: 0.7 g/dL (ref 0.4–1.0)
Beta Globulin: 0.9 g/dL (ref 0.7–1.3)
Gamma Globulin: 0.4 g/dL (ref 0.4–1.8)
Globulin, Total: 2.4 g/dL (ref 2.2–3.9)
M-Spike, %: 0.3 g/dL — ABNORMAL HIGH
SPEP Interpretation: 0
Total Protein ELP: 5.7 g/dL — ABNORMAL LOW (ref 6.0–8.5)

## 2020-09-18 LAB — IMMUNOFIXATION REFLEX, SERUM
IgA: 5 mg/dL — ABNORMAL LOW (ref 64–422)
IgG (Immunoglobin G), Serum: 553 mg/dL — ABNORMAL LOW (ref 586–1602)
IgM (Immunoglobulin M), Srm: 17 mg/dL — ABNORMAL LOW (ref 26–217)

## 2020-09-19 ENCOUNTER — Telehealth: Payer: Self-pay | Admitting: *Deleted

## 2020-09-19 NOTE — Telephone Encounter (Signed)
-----   Message from [name], MD sent at 09/19/2020  6:10 AM EST ----- Call - the myeloma protein is now down to 0.3!!   Great job!! [name]

## 2020-09-19 NOTE — Telephone Encounter (Signed)
Pt notified per order of Dr. Marin Olp that "the myeloma protein is now down to 0.3!!  Great job!!  Film/video editor"  Pt states that she feels weak after this last treatment, but is feeling better each day.  She states that she is eating and drinking without difficulty.  Pt is appreciative of call and is aware of appts for tomorrow.

## 2020-09-20 ENCOUNTER — Inpatient Hospital Stay: Payer: PPO

## 2020-09-20 ENCOUNTER — Inpatient Hospital Stay: Payer: PPO | Attending: Hematology & Oncology

## 2020-09-20 ENCOUNTER — Other Ambulatory Visit: Payer: Self-pay

## 2020-09-20 VITALS — BP 140/61 | HR 85 | Temp 97.8°F | Resp 19

## 2020-09-20 DIAGNOSIS — C9000 Multiple myeloma not having achieved remission: Secondary | ICD-10-CM

## 2020-09-20 DIAGNOSIS — Z79899 Other long term (current) drug therapy: Secondary | ICD-10-CM | POA: Diagnosis not present

## 2020-09-20 DIAGNOSIS — Z5112 Encounter for antineoplastic immunotherapy: Secondary | ICD-10-CM | POA: Insufficient documentation

## 2020-09-20 LAB — CBC WITH DIFFERENTIAL (CANCER CENTER ONLY)
Abs Immature Granulocytes: 0.04 10*3/uL (ref 0.00–0.07)
Basophils Absolute: 0 10*3/uL (ref 0.0–0.1)
Basophils Relative: 0 %
Eosinophils Absolute: 0.1 10*3/uL (ref 0.0–0.5)
Eosinophils Relative: 2 %
HCT: 36.1 % (ref 36.0–46.0)
Hemoglobin: 12.1 g/dL (ref 12.0–15.0)
Immature Granulocytes: 1 %
Lymphocytes Relative: 18 %
Lymphs Abs: 1.1 10*3/uL (ref 0.7–4.0)
MCH: 34 pg (ref 26.0–34.0)
MCHC: 33.5 g/dL (ref 30.0–36.0)
MCV: 101.4 fL — ABNORMAL HIGH (ref 80.0–100.0)
Monocytes Absolute: 0.9 10*3/uL (ref 0.1–1.0)
Monocytes Relative: 15 %
Neutro Abs: 3.8 10*3/uL (ref 1.7–7.7)
Neutrophils Relative %: 64 %
Platelet Count: 166 10*3/uL (ref 150–400)
RBC: 3.56 MIL/uL — ABNORMAL LOW (ref 3.87–5.11)
RDW: 15.9 % — ABNORMAL HIGH (ref 11.5–15.5)
WBC Count: 5.9 10*3/uL (ref 4.0–10.5)
nRBC: 0 % (ref 0.0–0.2)

## 2020-09-20 MED ORDER — SODIUM CHLORIDE 0.9 % IV SOLN
40.0000 mg | Freq: Once | INTRAVENOUS | Status: AC
  Administered 2020-09-20: 40 mg via INTRAVENOUS
  Filled 2020-09-20: qty 4

## 2020-09-20 MED ORDER — DEXTROSE 5 % IV SOLN
70.0000 mg/m2 | Freq: Once | INTRAVENOUS | Status: AC
  Administered 2020-09-20: 100 mg via INTRAVENOUS
  Filled 2020-09-20: qty 30

## 2020-09-20 MED ORDER — SODIUM CHLORIDE 0.9 % IV SOLN
Freq: Once | INTRAVENOUS | Status: AC
  Filled 2020-09-20: qty 250

## 2020-09-20 MED ORDER — SODIUM CHLORIDE 0.9 % IV SOLN
Freq: Once | INTRAVENOUS | Status: DC
  Filled 2020-09-20: qty 250

## 2020-09-20 NOTE — Progress Notes (Signed)
Per Dr. Marin Olp ok to use CMET from 09/13/2018 for today's treatment.

## 2020-09-20 NOTE — Patient Instructions (Signed)
[facility] Discharge Instructions for Patients Receiving Chemotherapy  Today you received the following chemotherapy agents Kyprolis  To help prevent nausea and vomiting after your treatment, we encourage you to take your nausea medication as prescribed by MD.   If you develop nausea and vomiting that is not controlled by your nausea medication, call the clinic.   BELOW ARE SYMPTOMS THAT SHOULD BE REPORTED IMMEDIATELY:  *FEVER GREATER THAN 100.5 F  *CHILLS WITH OR WITHOUT FEVER  NAUSEA AND VOMITING THAT IS NOT CONTROLLED WITH YOUR NAUSEA MEDICATION  *UNUSUAL SHORTNESS OF BREATH  *UNUSUAL BRUISING OR BLEEDING  TENDERNESS IN MOUTH AND THROAT WITH OR WITHOUT PRESENCE OF ULCERS  *URINARY PROBLEMS  *BOWEL PROBLEMS  UNUSUAL RASH Items with * indicate a potential emergency and should be followed up as soon as possible.  Feel free to call the clinic should you have any questions or concerns. The clinic phone number is [phone].  Please show the CHEMO ALERT CARD at check-in to the Emergency Department and triage nurse.   

## 2020-09-25 DIAGNOSIS — H04123 Dry eye syndrome of bilateral lacrimal glands: Secondary | ICD-10-CM | POA: Diagnosis not present

## 2020-09-25 DIAGNOSIS — I10 Essential (primary) hypertension: Secondary | ICD-10-CM | POA: Diagnosis not present

## 2020-09-25 DIAGNOSIS — Z961 Presence of intraocular lens: Secondary | ICD-10-CM | POA: Diagnosis not present

## 2020-09-25 DIAGNOSIS — Z9221 Personal history of antineoplastic chemotherapy: Secondary | ICD-10-CM | POA: Diagnosis not present

## 2020-09-27 ENCOUNTER — Inpatient Hospital Stay: Payer: PPO

## 2020-09-27 ENCOUNTER — Other Ambulatory Visit: Payer: Self-pay

## 2020-09-27 VITALS — BP 132/72 | HR 79 | Temp 98.1°F | Resp 16

## 2020-09-27 DIAGNOSIS — C9000 Multiple myeloma not having achieved remission: Secondary | ICD-10-CM

## 2020-09-27 DIAGNOSIS — Z5112 Encounter for antineoplastic immunotherapy: Secondary | ICD-10-CM | POA: Diagnosis not present

## 2020-09-27 LAB — CBC WITH DIFFERENTIAL (CANCER CENTER ONLY)
Abs Immature Granulocytes: 0 10*3/uL (ref 0.00–0.07)
Band Neutrophils: 0 %
Basophils Absolute: 0.1 10*3/uL (ref 0.0–0.1)
Basophils Relative: 1 %
Blasts: 0 %
Eosinophils Absolute: 0.2 10*3/uL (ref 0.0–0.5)
Eosinophils Relative: 2 %
HCT: 39.8 % (ref 36.0–46.0)
Hemoglobin: 13.1 g/dL (ref 12.0–15.0)
Lymphocytes Relative: 13 %
Lymphs Abs: 1.4 10*3/uL (ref 0.7–4.0)
MCH: 34.4 pg — ABNORMAL HIGH (ref 26.0–34.0)
MCHC: 32.9 g/dL (ref 30.0–36.0)
MCV: 104.5 fL — ABNORMAL HIGH (ref 80.0–100.0)
Metamyelocytes Relative: 0 %
Monocytes Absolute: 1.4 10*3/uL — ABNORMAL HIGH (ref 0.1–1.0)
Monocytes Relative: 13 %
Myelocytes: 0 %
Neutro Abs: 7.7 10*3/uL (ref 1.7–7.7)
Neutrophils Relative %: 71 %
Other: 0 %
Platelet Count: 242 10*3/uL (ref 150–400)
Promyelocytes Relative: 0 %
RBC: 3.81 MIL/uL — ABNORMAL LOW (ref 3.87–5.11)
RDW: 16.1 % — ABNORMAL HIGH (ref 11.5–15.5)
WBC Count: 10.8 10*3/uL — ABNORMAL HIGH (ref 4.0–10.5)
nRBC: 0 /100 WBC
nRBC: 0.2 % (ref 0.0–0.2)

## 2020-09-27 MED ORDER — DIPHENHYDRAMINE HCL 25 MG PO CAPS: 25.0000 mg | ORAL_CAPSULE | Freq: Once | ORAL | Status: DC

## 2020-09-27 MED ORDER — DARATUMUMAB-HYALURONIDASE-FIHJ 1800-30000 MG-UT/15ML ~~LOC~~ SOLN
1800.0000 mg | Freq: Once | SUBCUTANEOUS | Status: AC
  Administered 2020-09-27: 1800 mg via SUBCUTANEOUS
  Filled 2020-09-27: qty 15

## 2020-09-27 MED ORDER — SODIUM CHLORIDE 0.9 % IV SOLN
Freq: Once | INTRAVENOUS | Status: AC
  Filled 2020-09-27: qty 250

## 2020-09-27 MED ORDER — ACETAMINOPHEN 325 MG PO TABS: 650.0000 mg | ORAL_TABLET | Freq: Once | ORAL | Status: DC

## 2020-09-27 MED ORDER — SODIUM CHLORIDE 0.9 % IV SOLN
40.0000 mg | Freq: Once | INTRAVENOUS | Status: AC
  Administered 2020-09-27: 40 mg via INTRAVENOUS
  Filled 2020-09-27: qty 4

## 2020-09-27 MED ORDER — DEXTROSE 5 % IV SOLN
70.0000 mg/m2 | Freq: Once | INTRAVENOUS | Status: AC
  Administered 2020-09-27: 100 mg via INTRAVENOUS
  Filled 2020-09-27: qty 30

## 2020-09-27 NOTE — Progress Notes (Signed)
Okay to use CMET from 09/13/20 per Dr. Marin Olp.

## 2020-09-27 NOTE — Patient Instructions (Signed)
Carfilzomib injection What is this medicine? CARFILZOMIB (kar FILZ oh mib) targets a specific protein within cancer cells and stops the cancer cells from growing. It is used to treat multiple myeloma. This medicine may be used for other purposes; ask your health care provider or pharmacist if you have questions. COMMON BRAND NAME(S): KYPROLIS What should I tell my health care provider before I take this medicine? They need to know if you have any of these conditions:  heart disease  history of blood clots  irregular heartbeat  kidney disease  liver disease  lung or breathing disease  an unusual or allergic reaction to carfilzomib, or other medicines, foods, dyes, or preservatives  pregnant or trying to get pregnant  breast-feeding How should I use this medicine? This medicine is for injection or infusion into a vein. It is given by a health care professional in a hospital or clinic setting. Talk to your pediatrician regarding the use of this medicine in children. Special care may be needed. Overdosage: If you think you have taken too much of this medicine contact a poison control center or emergency room at once. NOTE: This medicine is only for you. Do not share this medicine with others. What if I miss a dose? It is important not to miss your dose. Call your doctor or health care professional if you are unable to keep an appointment. What may interact with this medicine? Interactions are not expected. Give your health care provider a list of all the medicines, herbs, non-prescription drugs, or dietary supplements you use. Also tell them if you smoke, drink alcohol, or use illegal drugs. Some items may interact with your medicine. This list may not describe all possible interactions. Give your health care provider a list of all the medicines, herbs, non-prescription drugs, or dietary supplements you use. Also tell them if you smoke, drink alcohol, or use illegal drugs. Some items  may interact with your medicine. What should I watch for while using this medicine? Your condition will be monitored carefully while you are receiving this medicine. Report any side effects. Continue your course of treatment even though you feel ill unless your doctor tells you to stop. You may need blood work done while you are taking this medicine. Do not become pregnant while taking this medicine or for at least 6 months after stopping it. Women should inform their doctor if they wish to become pregnant or think they might be pregnant. There is a potential for serious side effects to an unborn child. Men should not father a child while taking this medicine and for at least 3 months after stopping it. Talk to your health care professional or pharmacist for more information. Do not breast-feed an infant while taking this medicine or for 2 weeks after the last dose. Check with your doctor or health care professional if you get an attack of severe diarrhea, nausea and vomiting, or if you sweat a lot. The loss of too much body fluid can make it dangerous for you to take this medicine. You may get dizzy. Do not drive, use machinery, or do anything that needs mental alertness until you know how this medicine affects you. Do not stand or sit up quickly, especially if you are an older patient. This reduces the risk of dizzy or fainting spells. What side effects may I notice from receiving this medicine? Side effects that you should report to your doctor or health care professional as soon as possible:  allergic reactions like skin  rash, itching or hives, swelling of the face, lips, or tongue  confusion  dizziness  feeling faint or lightheaded  fever or chills  palpitations  seizures  signs and symptoms of bleeding such as bloody or black, tarry stools; red or dark-brown urine; spitting up blood or brown material that looks like coffee grounds; red spots on the skin; unusual bruising or bleeding  including from the eye, gums, or nose  signs and symptoms of a blood clot such as breathing problems; changes in vision; chest pain; severe, sudden headache; pain, swelling, warmth in the leg; trouble speaking; sudden numbness or weakness of the face, arm or leg  signs and symptoms of kidney injury like trouble passing urine or change in the amount of urine  signs and symptoms of liver injury like dark yellow or brown urine; general ill feeling or flu-like symptoms; light-colored stools; loss of appetite; nausea; right upper belly pain; unusually weak or tired; yellowing of the eyes or skin Side effects that usually do not require medical attention (report to your doctor or health care professional if they continue or are bothersome):  back pain  cough  diarrhea  headache  muscle cramps  trouble sleeping  vomiting This list may not describe all possible side effects. Call your doctor for medical advice about side effects. You may report side effects to FDA at 1-800-FDA-1088. Where should I keep my medicine? This drug is given in a hospital or clinic and will not be stored at home. NOTE: This sheet is a summary. It may not cover all possible information. If you have questions about this medicine, talk to your doctor, pharmacist, or health care provider.  2021 [employer]/Gold Standard (2019-03-15 19:44:21) Daratumumab injection What is this medicine? DARATUMUMAB (dar a toom ue mab) is a monoclonal antibody. It is used to treat multiple myeloma. This medicine may be used for other purposes; ask your health care provider or pharmacist if you have questions. COMMON BRAND NAME(S): DARZALEX What should I tell my health care provider before I take this medicine? They need to know if you have any of these conditions:  hereditary fructose intolerance  infection (especially a virus infection such as chickenpox, herpes, or hepatitis B virus)  lung or breathing disease (asthma, COPD)  an  unusual or allergic reaction to daratumumab, sorbitol, other medicines, foods, dyes, or preservatives  pregnant or trying to get pregnant  breast-feeding How should I use this medicine? This medicine is for infusion into a vein. It is given by a health care professional in a hospital or clinic setting. Talk to your pediatrician regarding the use of this medicine in children. Special care may be needed. Overdosage: If you think you have taken too much of this medicine contact a poison control center or emergency room at once. NOTE: This medicine is only for you. Do not share this medicine with others. What if I miss a dose? Keep appointments for follow-up doses as directed. It is important not to miss your dose. Call your doctor or health care professional if you are unable to keep an appointment. What may interact with this medicine? Interactions have not been studied. This list may not describe all possible interactions. Give your health care provider a list of all the medicines, herbs, non-prescription drugs, or dietary supplements you use. Also tell them if you smoke, drink alcohol, or use illegal drugs. Some items may interact with your medicine. What should I watch for while using this medicine? Your condition will be monitored  carefully while you are receiving this medicine. This medicine can cause serious allergic reactions. To reduce your risk, your health care provider may give you other medicine to take before receiving this one. Be sure to follow the directions from your health care provider. This medicine can affect the results of blood tests to match your blood type. These changes can last for up to 6 months after the final dose. Your healthcare provider will do blood tests to match your blood type before you start treatment. Tell all of your healthcare providers that you are being treated with this medicine before receiving a blood transfusion. This medicine can affect the results of  some tests used to determine treatment response; extra tests may be needed to evaluate response. Do not become pregnant while taking this medicine or for 3 months after stopping it. Women should inform their health care provider if they wish to become pregnant or think they might be pregnant. There is a potential for serious side effects to an unborn child. Talk to your health care provider for more information. Do not breast-feed an infant while taking this medicine. What side effects may I notice from receiving this medicine? Side effects that you should report to your doctor or health care professional as soon as possible:  allergic reactions (skin rash; itching or hives; swelling of the face, lips, or tongue)  infection (fever, chills, cough, sore throat, pain or difficulty passing urine)  infusion reaction (dizziness, fast heartbeat, feeling faint or lightheaded, falls, headache, increase in blood pressure, nausea, vomiting, or wheezing or trouble breathing with loud or whistling sounds)  unusual bleeding or bruising Side effects that usually do not require medical attention (report to your doctor or health care professional if they continue or are bothersome):  constipation  diarrhea  pain, tingling, numbness in the hands or feet  swelling of the ankles, feet, hands  tiredness This list may not describe all possible side effects. Call your doctor for medical advice about side effects. You may report side effects to FDA at 1-800-FDA-1088. Where should I keep my medicine? This drug is given in a hospital or clinic and will not be stored at home. NOTE: This sheet is a summary. It may not cover all possible information. If you have questions about this medicine, talk to your doctor, pharmacist, or health care provider.  2021 Elsevier/Gold Standard (2020-06-29 13:28:52)  

## 2020-09-28 ENCOUNTER — Other Ambulatory Visit: Payer: Self-pay | Admitting: Internal Medicine

## 2020-10-09 ENCOUNTER — Telehealth: Payer: Self-pay | Admitting: Internal Medicine

## 2020-10-09 MED ORDER — NEBIVOLOL HCL 5 MG PO TABS: ORAL_TABLET | ORAL | 3 refills | Status: DC

## 2020-10-09 NOTE — Telephone Encounter (Signed)
Refill x one year °

## 2020-10-09 NOTE — Telephone Encounter (Signed)
Kamillah Didonato [phone]  Jana Half called about her prescription for below medication she wants to make sure it is the name brand and it is for a 90 day supply, it is cheaper that way.  BYSTOLIC 5 MG tablet  [id number] - [place], [place] Phone:  [phone]  Fax:  [phone]

## 2020-10-11 ENCOUNTER — Inpatient Hospital Stay: Payer: PPO

## 2020-10-11 ENCOUNTER — Other Ambulatory Visit: Payer: Self-pay

## 2020-10-11 VITALS — BP 143/57 | HR 72 | Temp 98.5°F | Resp 17

## 2020-10-11 DIAGNOSIS — C9000 Multiple myeloma not having achieved remission: Secondary | ICD-10-CM

## 2020-10-11 DIAGNOSIS — Z5112 Encounter for antineoplastic immunotherapy: Secondary | ICD-10-CM | POA: Diagnosis not present

## 2020-10-11 LAB — CBC WITH DIFFERENTIAL (CANCER CENTER ONLY)
Abs Immature Granulocytes: 0.04 10*3/uL (ref 0.00–0.07)
Basophils Absolute: 0 10*3/uL (ref 0.0–0.1)
Basophils Relative: 0 %
Eosinophils Absolute: 0.1 10*3/uL (ref 0.0–0.5)
Eosinophils Relative: 1 %
HCT: 36 % (ref 36.0–46.0)
Hemoglobin: 11.9 g/dL — ABNORMAL LOW (ref 12.0–15.0)
Immature Granulocytes: 1 %
Lymphocytes Relative: 13 %
Lymphs Abs: 1 10*3/uL (ref 0.7–4.0)
MCH: 35.3 pg — ABNORMAL HIGH (ref 26.0–34.0)
MCHC: 33.1 g/dL (ref 30.0–36.0)
MCV: 106.8 fL — ABNORMAL HIGH (ref 80.0–100.0)
Monocytes Absolute: 0.8 10*3/uL (ref 0.1–1.0)
Monocytes Relative: 11 %
Neutro Abs: 5.7 10*3/uL (ref 1.7–7.7)
Neutrophils Relative %: 74 %
Platelet Count: 301 10*3/uL (ref 150–400)
RBC: 3.37 MIL/uL — ABNORMAL LOW (ref 3.87–5.11)
RDW: 15.2 % (ref 11.5–15.5)
WBC Count: 7.7 10*3/uL (ref 4.0–10.5)
nRBC: 0 % (ref 0.0–0.2)

## 2020-10-11 MED ORDER — SODIUM CHLORIDE 0.9 % IV SOLN
Freq: Once | INTRAVENOUS | Status: AC
  Filled 2020-10-11: qty 250

## 2020-10-11 MED ORDER — CARFILZOMIB CHEMO INJECTION 60 MG
70.0000 mg/m2 | Freq: Once | INTRAVENOUS | Status: AC
  Administered 2020-10-11: 100 mg via INTRAVENOUS
  Filled 2020-10-11: qty 30

## 2020-10-11 MED ORDER — SODIUM CHLORIDE 0.9 % IV SOLN
Freq: Once | INTRAVENOUS | Status: DC
  Filled 2020-10-11: qty 250

## 2020-10-11 MED ORDER — SODIUM CHLORIDE 0.9 % IV SOLN
40.0000 mg | Freq: Once | INTRAVENOUS | Status: AC
  Administered 2020-10-11: 40 mg via INTRAVENOUS
  Filled 2020-10-11: qty 4

## 2020-10-11 NOTE — Progress Notes (Signed)
Per dr Marin Olp okay to treat today without a C-MET

## 2020-10-18 ENCOUNTER — Inpatient Hospital Stay: Payer: PPO

## 2020-10-18 ENCOUNTER — Other Ambulatory Visit: Payer: Self-pay

## 2020-10-18 ENCOUNTER — Other Ambulatory Visit: Payer: Self-pay | Admitting: *Deleted

## 2020-10-18 VITALS — BP 144/66 | HR 81 | Temp 98.1°F | Resp 17

## 2020-10-18 DIAGNOSIS — C9000 Multiple myeloma not having achieved remission: Secondary | ICD-10-CM

## 2020-10-18 DIAGNOSIS — Z5112 Encounter for antineoplastic immunotherapy: Secondary | ICD-10-CM | POA: Diagnosis not present

## 2020-10-18 LAB — CBC WITH DIFFERENTIAL (CANCER CENTER ONLY)
Abs Immature Granulocytes: 0.09 10*3/uL — ABNORMAL HIGH (ref 0.00–0.07)
Basophils Absolute: 0 10*3/uL (ref 0.0–0.1)
Basophils Relative: 0 %
Eosinophils Absolute: 0.1 10*3/uL (ref 0.0–0.5)
Eosinophils Relative: 1 %
HCT: 37.7 % (ref 36.0–46.0)
Hemoglobin: 12.3 g/dL (ref 12.0–15.0)
Immature Granulocytes: 1 %
Lymphocytes Relative: 15 %
Lymphs Abs: 1.6 10*3/uL (ref 0.7–4.0)
MCH: 35.2 pg — ABNORMAL HIGH (ref 26.0–34.0)
MCHC: 32.6 g/dL (ref 30.0–36.0)
MCV: 108 fL — ABNORMAL HIGH (ref 80.0–100.0)
Monocytes Absolute: 1.1 10*3/uL — ABNORMAL HIGH (ref 0.1–1.0)
Monocytes Relative: 11 %
Neutro Abs: 7.9 10*3/uL — ABNORMAL HIGH (ref 1.7–7.7)
Neutrophils Relative %: 72 %
Platelet Count: 125 10*3/uL — ABNORMAL LOW (ref 150–400)
RBC: 3.49 MIL/uL — ABNORMAL LOW (ref 3.87–5.11)
RDW: 14.7 % (ref 11.5–15.5)
WBC Count: 10.8 10*3/uL — ABNORMAL HIGH (ref 4.0–10.5)
nRBC: 0 % (ref 0.0–0.2)

## 2020-10-18 LAB — COMPREHENSIVE METABOLIC PANEL
ALT: 20 U/L (ref 0–44)
AST: 24 U/L (ref 15–41)
Albumin: 4.1 g/dL (ref 3.5–5.0)
Alkaline Phosphatase: 52 U/L (ref 38–126)
Anion gap: 9 (ref 5–15)
BUN: 28 mg/dL — ABNORMAL HIGH (ref 8–23)
CO2: 23 mmol/L (ref 22–32)
Calcium: 9.5 mg/dL (ref 8.9–10.3)
Chloride: 107 mmol/L (ref 98–111)
Creatinine, Ser: 0.64 mg/dL (ref 0.44–1.00)
GFR, Estimated: >60 mL/min (ref >60)
Glucose, Bld: 80 mg/dL (ref 70–99)
Potassium: 4.7 mmol/L (ref 3.5–5.1)
Sodium: 139 mmol/L (ref 135–145)
Total Bilirubin: 0.7 mg/dL (ref 0.3–1.2)
Total Protein: 6 g/dL — ABNORMAL LOW (ref 6.5–8.1)

## 2020-10-18 MED ORDER — SODIUM CHLORIDE 0.9 % IV SOLN
40.0000 mg | Freq: Once | INTRAVENOUS | Status: AC
  Administered 2020-10-18: 40 mg via INTRAVENOUS
  Filled 2020-10-18: qty 4

## 2020-10-18 MED ORDER — SODIUM CHLORIDE 0.9 % IV SOLN
Freq: Once | INTRAVENOUS | Status: DC
  Filled 2020-10-18: qty 250

## 2020-10-18 MED ORDER — SODIUM CHLORIDE 0.9 % IV SOLN
Freq: Once | INTRAVENOUS | Status: AC
  Filled 2020-10-18: qty 250

## 2020-10-18 MED ORDER — DEXTROSE 5 % IV SOLN
70.0000 mg/m2 | Freq: Once | INTRAVENOUS | Status: AC
  Administered 2020-10-18: 100 mg via INTRAVENOUS
  Filled 2020-10-18: qty 30

## 2020-10-18 NOTE — Patient Instructions (Signed)
Carfilzomib injection What is this medicine? CARFILZOMIB (kar FILZ oh mib) targets a specific protein within cancer cells and stops the cancer cells from growing. It is used to treat multiple myeloma. This medicine may be used for other purposes; ask your health care provider or pharmacist if you have questions. COMMON BRAND NAME(S): KYPROLIS What should I tell my health care provider before I take this medicine? They need to know if you have any of these conditions:  heart disease  history of blood clots  irregular heartbeat  kidney disease  liver disease  lung or breathing disease  an unusual or allergic reaction to carfilzomib, or other medicines, foods, dyes, or preservatives  pregnant or trying to get pregnant  breast-feeding How should I use this medicine? This medicine is for injection or infusion into a vein. It is given by a health care professional in a hospital or clinic setting. Talk to your pediatrician regarding the use of this medicine in children. Special care may be needed. Overdosage: If you think you have taken too much of this medicine contact a poison control center or emergency room at once. NOTE: This medicine is only for you. Do not share this medicine with others. What if I miss a dose? It is important not to miss your dose. Call your doctor or health care professional if you are unable to keep an appointment. What may interact with this medicine? Interactions are not expected. Give your health care provider a list of all the medicines, herbs, non-prescription drugs, or dietary supplements you use. Also tell them if you smoke, drink alcohol, or use illegal drugs. Some items may interact with your medicine. This list may not describe all possible interactions. Give your health care provider a list of all the medicines, herbs, non-prescription drugs, or dietary supplements you use. Also tell them if you smoke, drink alcohol, or use illegal drugs. Some items  may interact with your medicine. What should I watch for while using this medicine? Your condition will be monitored carefully while you are receiving this medicine. Report any side effects. Continue your course of treatment even though you feel ill unless your doctor tells you to stop. You may need blood work done while you are taking this medicine. Do not become pregnant while taking this medicine or for at least 6 months after stopping it. Women should inform their doctor if they wish to become pregnant or think they might be pregnant. There is a potential for serious side effects to an unborn child. Men should not father a child while taking this medicine and for at least 3 months after stopping it. Talk to your health care professional or pharmacist for more information. Do not breast-feed an infant while taking this medicine or for 2 weeks after the last dose. Check with your doctor or health care professional if you get an attack of severe diarrhea, nausea and vomiting, or if you sweat a lot. The loss of too much body fluid can make it dangerous for you to take this medicine. You may get dizzy. Do not drive, use machinery, or do anything that needs mental alertness until you know how this medicine affects you. Do not stand or sit up quickly, especially if you are an older patient. This reduces the risk of dizzy or fainting spells. What side effects may I notice from receiving this medicine? Side effects that you should report to your doctor or health care professional as soon as possible:  allergic reactions like skin  rash, itching or hives, swelling of the face, lips, or tongue  confusion  dizziness  feeling faint or lightheaded  fever or chills  palpitations  seizures  signs and symptoms of bleeding such as bloody or black, tarry stools; red or dark-brown urine; spitting up blood or brown material that looks like coffee grounds; red spots on the skin; unusual bruising or bleeding  including from the eye, gums, or nose  signs and symptoms of a blood clot such as breathing problems; changes in vision; chest pain; severe, sudden headache; pain, swelling, warmth in the leg; trouble speaking; sudden numbness or weakness of the face, arm or leg  signs and symptoms of kidney injury like trouble passing urine or change in the amount of urine  signs and symptoms of liver injury like dark yellow or brown urine; general ill feeling or flu-like symptoms; light-colored stools; loss of appetite; nausea; right upper belly pain; unusually weak or tired; yellowing of the eyes or skin Side effects that usually do not require medical attention (report to your doctor or health care professional if they continue or are bothersome):  back pain  cough  diarrhea  headache  muscle cramps  trouble sleeping  vomiting This list may not describe all possible side effects. Call your doctor for medical advice about side effects. You may report side effects to FDA at 1-800-FDA-1088. Where should I keep my medicine? This drug is given in a hospital or clinic and will not be stored at home. NOTE: This sheet is a summary. It may not cover all possible information. If you have questions about this medicine, talk to your doctor, pharmacist, or health care provider.  2021 Elsevier/Gold Standard (2019-03-15 19:44:21) Daratumumab injection What is this medicine? DARATUMUMAB (dar a toom ue mab) is a monoclonal antibody. It is used to treat multiple myeloma. This medicine may be used for other purposes; ask your health care provider or pharmacist if you have questions. COMMON BRAND NAME(S): DARZALEX What should I tell my health care provider before I take this medicine? They need to know if you have any of these conditions:  hereditary fructose intolerance  infection (especially a virus infection such as chickenpox, herpes, or hepatitis B virus)  lung or breathing disease (asthma, COPD)  an  unusual or allergic reaction to daratumumab, sorbitol, other medicines, foods, dyes, or preservatives  pregnant or trying to get pregnant  breast-feeding How should I use this medicine? This medicine is for infusion into a vein. It is given by a health care professional in a hospital or clinic setting. Talk to your pediatrician regarding the use of this medicine in children. Special care may be needed. Overdosage: If you think you have taken too much of this medicine contact a poison control center or emergency room at once. NOTE: This medicine is only for you. Do not share this medicine with others. What if I miss a dose? Keep appointments for follow-up doses as directed. It is important not to miss your dose. Call your doctor or health care professional if you are unable to keep an appointment. What may interact with this medicine? Interactions have not been studied. This list may not describe all possible interactions. Give your health care provider a list of all the medicines, herbs, non-prescription drugs, or dietary supplements you use. Also tell them if you smoke, drink alcohol, or use illegal drugs. Some items may interact with your medicine. What should I watch for while using this medicine? Your condition will be monitored  carefully while you are receiving this medicine. This medicine can cause serious allergic reactions. To reduce your risk, your health care provider may give you other medicine to take before receiving this one. Be sure to follow the directions from your health care provider. This medicine can affect the results of blood tests to match your blood type. These changes can last for up to 6 months after the final dose. Your healthcare provider will do blood tests to match your blood type before you start treatment. Tell all of your healthcare providers that you are being treated with this medicine before receiving a blood transfusion. This medicine can affect the results of  some tests used to determine treatment response; extra tests may be needed to evaluate response. Do not become pregnant while taking this medicine or for 3 months after stopping it. Women should inform their health care provider if they wish to become pregnant or think they might be pregnant. There is a potential for serious side effects to an unborn child. Talk to your health care provider for more information. Do not breast-feed an infant while taking this medicine. What side effects may I notice from receiving this medicine? Side effects that you should report to your doctor or health care professional as soon as possible:  allergic reactions (skin rash; itching or hives; swelling of the face, lips, or tongue)  infection (fever, chills, cough, sore throat, pain or difficulty passing urine)  infusion reaction (dizziness, fast heartbeat, feeling faint or lightheaded, falls, headache, increase in blood pressure, nausea, vomiting, or wheezing or trouble breathing with loud or whistling sounds)  unusual bleeding or bruising Side effects that usually do not require medical attention (report to your doctor or health care professional if they continue or are bothersome):  constipation  diarrhea  pain, tingling, numbness in the hands or feet  swelling of the ankles, feet, hands  tiredness This list may not describe all possible side effects. Call your doctor for medical advice about side effects. You may report side effects to FDA at 1-800-FDA-1088. Where should I keep my medicine? This drug is given in a hospital or clinic and will not be stored at home. NOTE: This sheet is a summary. It may not cover all possible information. If you have questions about this medicine, talk to your doctor, pharmacist, or health care provider.  2021 Elsevier/Gold Standard (2020-06-29 13:28:52)

## 2020-10-25 ENCOUNTER — Inpatient Hospital Stay (HOSPITAL_BASED_OUTPATIENT_CLINIC_OR_DEPARTMENT_OTHER): Payer: PPO | Admitting: Hematology & Oncology

## 2020-10-25 ENCOUNTER — Inpatient Hospital Stay: Payer: PPO | Attending: Hematology & Oncology

## 2020-10-25 ENCOUNTER — Inpatient Hospital Stay: Payer: PPO

## 2020-10-25 ENCOUNTER — Telehealth: Payer: Self-pay

## 2020-10-25 ENCOUNTER — Encounter: Payer: Self-pay | Admitting: Hematology & Oncology

## 2020-10-25 ENCOUNTER — Other Ambulatory Visit: Payer: Self-pay

## 2020-10-25 VITALS — BP 145/71 | HR 80 | Temp 97.9°F | Resp 16 | Wt 106.8 lb

## 2020-10-25 DIAGNOSIS — C9000 Multiple myeloma not having achieved remission: Secondary | ICD-10-CM | POA: Diagnosis not present

## 2020-10-25 DIAGNOSIS — Z5112 Encounter for antineoplastic immunotherapy: Secondary | ICD-10-CM | POA: Diagnosis not present

## 2020-10-25 DIAGNOSIS — Z79899 Other long term (current) drug therapy: Secondary | ICD-10-CM | POA: Diagnosis not present

## 2020-10-25 LAB — CMP (CANCER CENTER ONLY)
ALT: 20 U/L (ref 0–44)
AST: 16 U/L (ref 15–41)
Albumin: 4.2 g/dL (ref 3.5–5.0)
Alkaline Phosphatase: 58 U/L (ref 38–126)
Anion gap: 8 (ref 5–15)
BUN: 25 mg/dL — ABNORMAL HIGH (ref 8–23)
CO2: 26 mmol/L (ref 22–32)
Calcium: 9.4 mg/dL (ref 8.9–10.3)
Chloride: 106 mmol/L (ref 98–111)
Creatinine: 0.68 mg/dL (ref 0.44–1.00)
GFR, Estimated: >60 mL/min (ref >60)
Glucose, Bld: 105 mg/dL — ABNORMAL HIGH (ref 70–99)
Potassium: 4.6 mmol/L (ref 3.5–5.1)
Sodium: 140 mmol/L (ref 135–145)
Total Bilirubin: 0.7 mg/dL (ref 0.3–1.2)
Total Protein: 6.1 g/dL — ABNORMAL LOW (ref 6.5–8.1)

## 2020-10-25 LAB — CBC WITH DIFFERENTIAL (CANCER CENTER ONLY)
Abs Immature Granulocytes: 0.05 10*3/uL (ref 0.00–0.07)
Basophils Absolute: 0 10*3/uL (ref 0.0–0.1)
Basophils Relative: 0 %
Eosinophils Absolute: 0.1 10*3/uL (ref 0.0–0.5)
Eosinophils Relative: 1 %
HCT: 36.7 % (ref 36.0–46.0)
Hemoglobin: 12.2 g/dL (ref 12.0–15.0)
Immature Granulocytes: 1 %
Lymphocytes Relative: 11 %
Lymphs Abs: 0.8 10*3/uL (ref 0.7–4.0)
MCH: 36 pg — ABNORMAL HIGH (ref 26.0–34.0)
MCHC: 33.2 g/dL (ref 30.0–36.0)
MCV: 108.3 fL — ABNORMAL HIGH (ref 80.0–100.0)
Monocytes Absolute: 1 10*3/uL (ref 0.1–1.0)
Monocytes Relative: 12 %
Neutro Abs: 6 10*3/uL (ref 1.7–7.7)
Neutrophils Relative %: 75 %
Platelet Count: 197 10*3/uL (ref 150–400)
RBC: 3.39 MIL/uL — ABNORMAL LOW (ref 3.87–5.11)
RDW: 14.4 % (ref 11.5–15.5)
WBC Count: 8 10*3/uL (ref 4.0–10.5)
nRBC: 0 % (ref 0.0–0.2)

## 2020-10-25 LAB — IRON AND TIBC
Iron: 136 ug/dL (ref 41–142)
Saturation Ratios: 39 % (ref 21–57)
TIBC: 353 ug/dL (ref 236–444)
UIBC: 216 ug/dL (ref 120–384)

## 2020-10-25 LAB — FERRITIN: Ferritin: 258 ng/mL (ref 11–307)

## 2020-10-25 LAB — LACTATE DEHYDROGENASE: LDH: 257 U/L — ABNORMAL HIGH (ref 98–192)

## 2020-10-25 MED ORDER — SODIUM CHLORIDE 0.9 % IV SOLN
Freq: Once | INTRAVENOUS | Status: DC
  Filled 2020-10-25: qty 250

## 2020-10-25 MED ORDER — SODIUM CHLORIDE 0.9 % IV SOLN
40.0000 mg | Freq: Once | INTRAVENOUS | Status: AC
  Administered 2020-10-25: 40 mg via INTRAVENOUS
  Filled 2020-10-25: qty 4

## 2020-10-25 MED ORDER — SODIUM CHLORIDE 0.9 % IV SOLN
Freq: Once | INTRAVENOUS | Status: AC
  Filled 2020-10-25: qty 250

## 2020-10-25 MED ORDER — DEXTROSE 5 % IV SOLN
70.0000 mg/m2 | Freq: Once | INTRAVENOUS | Status: AC
  Administered 2020-10-25: 100 mg via INTRAVENOUS
  Filled 2020-10-25: qty 30

## 2020-10-25 NOTE — Progress Notes (Signed)
Patient wants to wait and receive her Faspro injection next time she is here for chemotherapy. She does not want Faspro today. Dr. Marin Olp notified.

## 2020-10-25 NOTE — Progress Notes (Signed)
Hematology and Oncology Follow Up Visit  Debra Ruiz [id number] 1945-11-12 75 y.o. 10/25/2020   Principle Diagnosis:  IgG Kappa myeloma - compression fractures - 13q-, +11, 14q+ - hyperdiploid -- progressive Iron def anemia  Completed Therapy: S/p Kyphoplasty of T11 and T12 XRT to lower spine-completed on 09/25/2016  Current Therapy:    Faspro -- start on 04/05/2020 -- s/p cycle #5 -- Kyprolis added on 07/20/2020 -- s/p cycle #4 Ninlaro 2.3 mg po q week (3 on/1 off) -- start on 01/24/2020 -- d/c on 03/29/2020 due to toxicity Velcade/Pomalyst/Decadron -Start on 09/02/2018 --Pomalyst on hold since 11/09/2018.  D/C on 01/17/2020 Xgeva $RemoveBeforeDE'120mg'FULhSGbeGYLLilT$  Sq q  3 month - next dose in 11/2020  IV Injectafer -- last dose given on 11/2018   Interim History:  Debra Ruiz is here today for follow-up.  She has responded nicely to the Faspro/Kyprolis combination.  However, she started to get fatigued from the treatment.  We will see about trying to spread her treatments out a little bit longer.  She is responding.  The last M spike back in February was 0.3 g/dL.  Her IgG level was 530 mg/dL.  The kappa light chain was down to 1.5 mg/dL.  She is having some memory issues.  Her husband is also having some memory issues.  She does feel tired.  Her appetite is doing okay.  She has had no change in bowel or bladder habits.  She has had no headache.  There is been no bleeding.  She and her husband want to go to the beach in May.  I do not see any problems with him going.  We can arrange her schedule to accommodate that.  Overall, her performance status is ECOG 1.    Medications:  Allergies as of 10/25/2020      Reactions   Crestor [rosuvastatin Calcium]    Causes liver functions to be elevated      Medication List       Accurate as of October 25, 2020 11:03 AM. If you have any questions, ask your nurse or doctor.        STOP taking these medications   montelukast 10 MG tablet Commonly known as:  SINGULAIR Stopped by: [name], MD     TAKE these medications   ALPRAZolam 0.25 MG tablet Commonly known as: XANAX Take 1 tablet (0.25 mg total) by mouth 2 (two) times daily as needed. for anxiety   amLODipine 5 MG tablet Commonly known as: NORVASC TAKE 1 TABLET(5 MG) BY MOUTH DAILY   aspirin 81 MG EC tablet Take 81 mg by mouth every other day.   atorvastatin 20 MG tablet Commonly known as: LIPITOR TAKE 1 TABLET(20 MG) BY MOUTH DAILY   buPROPion 300 MG 24 hr tablet Commonly known as: Wellbutrin XL Take 1 tablet (300 mg total) by mouth daily.   dexamethasone 4 MG tablet Commonly known as: DECADRON TAKE 3 TABLETS BY MOUTH 1 TIME A WEEK   escitalopram 20 MG tablet Commonly known as: LEXAPRO TAKE 1 TABLET(20 MG) BY MOUTH DAILY   famciclovir 250 MG tablet Commonly known as: FAMVIR TAKE 1 TABLET(250 MG) BY MOUTH DAILY   IRON PO Take by mouth daily.   lidocaine-prilocaine cream Commonly known as: EMLA Apply to affected area once   LORazepam 0.5 MG tablet Commonly known as: Ativan Take 1 tablet (0.5 mg total) by mouth every 6 (six) hours as needed (Nausea or vomiting).   nebivolol 5 MG tablet Commonly known as:  Bystolic TAKE 1 TABLET(5 MG) BY MOUTH DAILY   ondansetron 8 MG tablet Commonly known as: Zofran Take 1 tablet (8 mg total) by mouth 2 (two) times daily as needed (Nausea or vomiting).   OVER THE COUNTER MEDICATION Dynamic Brain - 2 capsules at night.   potassium chloride SA 20 MEQ tablet Commonly known as: KLOR-CON TAKE 1 TABLET(20 MEQ) BY MOUTH DAILY   prochlorperazine 10 MG tablet Commonly known as: COMPAZINE TAKE 1 TABLET(10 MG) BY MOUTH EVERY 6 HOURS AS NEEDED FOR NAUSEA OR VOMITING   prochlorperazine 10 MG tablet Commonly known as: COMPAZINE Take 1 tablet (10 mg total) by mouth every 6 (six) hours as needed (Nausea or vomiting).   REFRESH DRY EYE THERAPY OP Apply to eye.   temazepam 7.5 MG capsule Commonly known as: RESTORIL TAKE 1  CAPSULE(7.5 MG) BY MOUTH AT BEDTIME AS NEEDED FOR SLEEP   Vitamin B-12 5000 MCG Tbdp Take by mouth daily.   Vitamin D 50 MCG (2000 UT) Caps Take 2,000 Units by mouth daily.       Allergies:  Allergies  Allergen Reactions  . Crestor [Rosuvastatin Calcium]     Causes liver functions to be elevated    Past Medical History, Surgical history, Social history, and Family History were reviewed and updated.  Review of Systems: Review of Systems  Constitutional: Negative.   HENT: Negative.   Eyes: Negative.   Respiratory: Negative.   Cardiovascular: Negative.   Gastrointestinal: Negative.   Genitourinary: Negative.   Musculoskeletal: Positive for back pain.  Skin: Negative.   Neurological: Negative.   Endo/Heme/Allergies: Negative.   Psychiatric/Behavioral: Negative.     Physical Exam:  weight is 106 lb 12.8 oz (48.4 kg). Her oral temperature is 97.9 F (36.6 C). Her blood pressure is 145/71 (abnormal) and her pulse is 80. Her respiration is 16 and oxygen saturation is 99%.   Wt Readings from Last 3 Encounters:  10/25/20 106 lb 12.8 oz (48.4 kg)  09/13/20 112 lb (50.8 kg)  08/17/20 110 lb (49.9 kg)    Physical Exam Vitals reviewed.  HENT:     Head: Normocephalic and atraumatic.  Eyes:     Pupils: Pupils are equal, round, and reactive to light.  Cardiovascular:     Rate and Rhythm: Normal rate and regular rhythm.     Heart sounds: Normal heart sounds.  Pulmonary:     Effort: Pulmonary effort is normal.     Breath sounds: Normal breath sounds.  Abdominal:     General: Bowel sounds are normal.     Palpations: Abdomen is soft.  Musculoskeletal:        General: No tenderness or deformity. Normal range of motion.     Cervical back: Normal range of motion.  Lymphadenopathy:     Cervical: No cervical adenopathy.  Skin:    General: Skin is warm and dry.     Findings: No erythema or rash.  Neurological:     Mental Status: She is alert and oriented to person, place,  and time.  Psychiatric:        Behavior: Behavior normal.        Thought Content: Thought content normal.        Judgment: Judgment normal.       Lab Results  Component Value Date   WBC 8.0 10/25/2020   HGB 12.2 10/25/2020   HCT 36.7 10/25/2020   MCV 108.3 (H) 10/25/2020   PLT 197 10/25/2020   Lab Results  Component Value Date  FERRITIN 128 10/06/2019   IRON 168 (H) 10/06/2019   TIBC 326 10/06/2019   UIBC 159 10/06/2019   IRONPCTSAT 51 10/06/2019   Lab Results  Component Value Date   RBC 3.39 (L) 10/25/2020   Lab Results  Component Value Date   KPAFRELGTCHN 15.1 09/13/2020   LAMBDASER <1.5 (L) 09/13/2020   KAPLAMBRATIO >10.07 (H) 09/13/2020   Lab Results  Component Value Date   IGGSERUM 503 (L) 09/13/2020   IGGSERUM 553 (L) 09/13/2020   IGA <5 (L) 09/13/2020   IGA 5 (L) 09/13/2020   IGMSERUM 15 (L) 09/13/2020   IGMSERUM 17 (L) 09/13/2020   Lab Results  Component Value Date   TOTALPROTELP 5.7 (L) 09/13/2020   ALBUMINELP 3.3 09/13/2020   A1GS 0.3 09/13/2020   A2GS 0.7 09/13/2020   BETS 0.9 09/13/2020   GAMS 0.4 09/13/2020   MSPIKE 0.3 (H) 09/13/2020   SPEI Comment 01/28/2018     Chemistry      Component Value Date/Time   NA 140 10/25/2020 0925   NA 148 (H) 07/16/2017 1128   NA 143 03/05/2017 1051   K 4.6 10/25/2020 0925   K 3.8 07/16/2017 1128   K 3.7 03/05/2017 1051   CL 106 10/25/2020 0925   CL 106 07/16/2017 1128   CO2 26 10/25/2020 0925   CO2 29 07/16/2017 1128   CO2 25 03/05/2017 1051   BUN 25 (H) 10/25/2020 0925   BUN 23 (H) 07/16/2017 1128   BUN 18.2 03/05/2017 1051   CREATININE 0.68 10/25/2020 0925   CREATININE 0.75 06/20/2020 1127   CREATININE 0.8 03/05/2017 1051      Component Value Date/Time   CALCIUM 9.4 10/25/2020 0925   CALCIUM 9.7 07/16/2017 1128   CALCIUM 8.9 03/05/2017 1051   ALKPHOS 58 10/25/2020 0925   ALKPHOS 71 07/16/2017 1128   ALKPHOS 67 03/05/2017 1051   AST 16 10/25/2020 0925   AST 15 03/05/2017 1051   ALT 20  10/25/2020 0925   ALT 23 07/16/2017 1128   ALT 17 03/05/2017 1051   BILITOT 0.7 10/25/2020 0925   BILITOT 0.55 03/05/2017 1051        Impression and Plan: Ms. Wiggs is a very pleasant 75 yo caucasian female with IgG Kappa Myeloma.    We clearly have seen a response with Faspro/Kyprolis combination.  She tolerated this well.  Again, we will try to decrease the frequency of her treatments.  The [place] is only monthly.  The Kyprolis we can try to make every other week.  We will plan to get her back in another month for follow-up.   [name], MD 4/6/202211:03 AM

## 2020-10-25 NOTE — Patient Instructions (Signed)
[facility] Discharge Instructions for Patients Receiving Chemotherapy  Today you received the following chemotherapy agents:  Kyprolis  To help prevent nausea and vomiting after your treatment, we encourage you to take your nausea medication as prescribed.   If you develop nausea and vomiting that is not controlled by your nausea medication, call the clinic.   BELOW ARE SYMPTOMS THAT SHOULD BE REPORTED IMMEDIATELY:  *FEVER GREATER THAN 100.5 F  *CHILLS WITH OR WITHOUT FEVER  NAUSEA AND VOMITING THAT IS NOT CONTROLLED WITH YOUR NAUSEA MEDICATION  *UNUSUAL SHORTNESS OF BREATH  *UNUSUAL BRUISING OR BLEEDING  TENDERNESS IN MOUTH AND THROAT WITH OR WITHOUT PRESENCE OF ULCERS  *URINARY PROBLEMS  *BOWEL PROBLEMS  UNUSUAL RASH Items with * indicate a potential emergency and should be followed up as soon as possible.  Feel free to call the clinic should you have any questions or concerns. The clinic phone number is [phone].  Please show the CHEMO ALERT CARD at check-in to the Emergency Department and triage nurse.   

## 2020-10-25 NOTE — Telephone Encounter (Signed)
appts made per 10/25/20 los and pt to gain sch in tx/avs   Debra Ruiz

## 2020-10-26 LAB — KAPPA/LAMBDA LIGHT CHAINS
Kappa free light chain: 6 mg/L (ref 3.3–19.4)
Kappa, lambda light chain ratio: >4 — ABNORMAL HIGH (ref 0.26–1.65)
Lambda free light chains: <1.5 mg/L — ABNORMAL LOW (ref 5.7–26.3)

## 2020-10-26 LAB — IGG, IGA, IGM
IgA: <5 mg/dL — ABNORMAL LOW (ref 64–422)
IgG (Immunoglobin G), Serum: 388 mg/dL — ABNORMAL LOW (ref 586–1602)
IgM (Immunoglobulin M), Srm: 11 mg/dL — ABNORMAL LOW (ref 26–217)

## 2020-10-30 LAB — PROTEIN ELECTROPHORESIS, SERUM, WITH REFLEX
A/G Ratio: 1.7 (ref 0.7–1.7)
Albumin ELP: 3.7 g/dL (ref 2.9–4.4)
Alpha-1-Globulin: 0.3 g/dL (ref 0.0–0.4)
Alpha-2-Globulin: 0.6 g/dL (ref 0.4–1.0)
Beta Globulin: 1 g/dL (ref 0.7–1.3)
Gamma Globulin: 0.3 g/dL — ABNORMAL LOW (ref 0.4–1.8)
Globulin, Total: 2.2 g/dL (ref 2.2–3.9)
M-Spike, %: 0.2 g/dL — ABNORMAL HIGH
SPEP Interpretation: 0
Total Protein ELP: 5.9 g/dL — ABNORMAL LOW (ref 6.0–8.5)

## 2020-10-30 LAB — IMMUNOFIXATION REFLEX, SERUM
IgA: <5 mg/dL — ABNORMAL LOW (ref 64–422)
IgG (Immunoglobin G), Serum: 404 mg/dL — ABNORMAL LOW (ref 586–1602)
IgM (Immunoglobulin M), Srm: 12 mg/dL — ABNORMAL LOW (ref 26–217)

## 2020-10-31 ENCOUNTER — Telehealth: Payer: Self-pay | Admitting: *Deleted

## 2020-10-31 NOTE — Telephone Encounter (Signed)
Pt notified per order of Dr. Marin Olp that "the myeloma level is still coming down!!! Now it is 0.2. great job!! [name]"   Pt appreciative of call and has no questions or concerns at this time.

## 2020-11-02 ENCOUNTER — Other Ambulatory Visit: Payer: Self-pay | Admitting: Internal Medicine

## 2020-11-02 ENCOUNTER — Other Ambulatory Visit: Payer: Self-pay | Admitting: Hematology & Oncology

## 2020-11-02 DIAGNOSIS — C9000 Multiple myeloma not having achieved remission: Secondary | ICD-10-CM

## 2020-11-02 DIAGNOSIS — E782 Mixed hyperlipidemia: Secondary | ICD-10-CM

## 2020-11-02 DIAGNOSIS — R5383 Other fatigue: Secondary | ICD-10-CM

## 2020-11-08 ENCOUNTER — Inpatient Hospital Stay: Payer: PPO

## 2020-11-08 ENCOUNTER — Other Ambulatory Visit: Payer: Self-pay

## 2020-11-08 ENCOUNTER — Ambulatory Visit: Payer: PPO | Attending: Internal Medicine

## 2020-11-08 VITALS — BP 132/60 | HR 73 | Temp 97.8°F | Resp 18

## 2020-11-08 DIAGNOSIS — C9000 Multiple myeloma not having achieved remission: Secondary | ICD-10-CM

## 2020-11-08 DIAGNOSIS — Z5112 Encounter for antineoplastic immunotherapy: Secondary | ICD-10-CM | POA: Diagnosis not present

## 2020-11-08 DIAGNOSIS — Z23 Encounter for immunization: Secondary | ICD-10-CM

## 2020-11-08 LAB — CBC WITH DIFFERENTIAL (CANCER CENTER ONLY)
Abs Immature Granulocytes: 0.04 10*3/uL (ref 0.00–0.07)
Basophils Absolute: 0 10*3/uL (ref 0.0–0.1)
Basophils Relative: 0 %
Eosinophils Absolute: 0.1 10*3/uL (ref 0.0–0.5)
Eosinophils Relative: 1 %
HCT: 37.6 % (ref 36.0–46.0)
Hemoglobin: 12.6 g/dL (ref 12.0–15.0)
Immature Granulocytes: 1 %
Lymphocytes Relative: 10 %
Lymphs Abs: 0.8 10*3/uL (ref 0.7–4.0)
MCH: 37.1 pg — ABNORMAL HIGH (ref 26.0–34.0)
MCHC: 33.5 g/dL (ref 30.0–36.0)
MCV: 110.6 fL — ABNORMAL HIGH (ref 80.0–100.0)
Monocytes Absolute: 0.8 10*3/uL (ref 0.1–1.0)
Monocytes Relative: 9 %
Neutro Abs: 6.5 10*3/uL (ref 1.7–7.7)
Neutrophils Relative %: 79 %
Platelet Count: 292 10*3/uL (ref 150–400)
RBC: 3.4 MIL/uL — ABNORMAL LOW (ref 3.87–5.11)
RDW: 13.5 % (ref 11.5–15.5)
WBC Count: 8.2 10*3/uL (ref 4.0–10.5)
nRBC: 0 % (ref 0.0–0.2)

## 2020-11-08 LAB — CMP (CANCER CENTER ONLY)
ALT: 19 U/L (ref 0–44)
AST: 16 U/L (ref 15–41)
Albumin: 4 g/dL (ref 3.5–5.0)
Alkaline Phosphatase: 62 U/L (ref 38–126)
Anion gap: 8 (ref 5–15)
BUN: 23 mg/dL (ref 8–23)
CO2: 27 mmol/L (ref 22–32)
Calcium: 9.4 mg/dL (ref 8.9–10.3)
Chloride: 106 mmol/L (ref 98–111)
Creatinine: 0.69 mg/dL (ref 0.44–1.00)
GFR, Estimated: >60 mL/min (ref >60)
Glucose, Bld: 97 mg/dL (ref 70–99)
Potassium: 4.1 mmol/L (ref 3.5–5.1)
Sodium: 141 mmol/L (ref 135–145)
Total Bilirubin: 0.4 mg/dL (ref 0.3–1.2)
Total Protein: 6 g/dL — ABNORMAL LOW (ref 6.5–8.1)

## 2020-11-08 MED ORDER — SODIUM CHLORIDE 0.9 % IV SOLN
Freq: Once | INTRAVENOUS | Status: AC
  Filled 2020-11-08: qty 250

## 2020-11-08 MED ORDER — DARATUMUMAB-HYALURONIDASE-FIHJ 1800-30000 MG-UT/15ML ~~LOC~~ SOLN
1800.0000 mg | Freq: Once | SUBCUTANEOUS | Status: AC
Start: 2020-11-08 — End: 2020-11-08
  Administered 2020-11-08: 1800 mg via SUBCUTANEOUS
  Filled 2020-11-08: qty 15

## 2020-11-08 MED ORDER — SODIUM CHLORIDE 0.9 % IV SOLN
Freq: Once | INTRAVENOUS | Status: DC
  Filled 2020-11-08: qty 250

## 2020-11-08 MED ORDER — DIPHENHYDRAMINE HCL 25 MG PO CAPS
25.0000 mg | ORAL_CAPSULE | Freq: Once | ORAL | Status: AC
Start: 2020-11-08 — End: 2020-11-08
  Administered 2020-11-08: 25 mg via ORAL

## 2020-11-08 MED ORDER — DEXTROSE 5 % IV SOLN
70.0000 mg/m2 | Freq: Once | INTRAVENOUS | Status: AC
  Administered 2020-11-08: 100 mg via INTRAVENOUS
  Filled 2020-11-08: qty 5

## 2020-11-08 MED ORDER — ACETAMINOPHEN 325 MG PO TABS: 650.0000 mg | ORAL_TABLET | Freq: Once | ORAL | Status: DC

## 2020-11-08 MED ORDER — SODIUM CHLORIDE 0.9 % IV SOLN
40.0000 mg | Freq: Once | INTRAVENOUS | Status: AC
  Administered 2020-11-08: 40 mg via INTRAVENOUS
  Filled 2020-11-08: qty 4

## 2020-11-08 NOTE — Progress Notes (Signed)
   Covid-19 Vaccination Clinic  Name:  RENUKA FARFAN    MRN: [id number] DOB: 07/31/1945  11/08/2020  Ms. Amodei was observed post Covid-19 immunization for 15 minutes without incident. She was provided with Vaccine Information Sheet and instruction to access the V-Safe system.   Ms. Gisler was instructed to call 911 with any severe reactions post vaccine: Marland Kitchen Difficulty breathing  . Swelling of face and throat  . A fast heartbeat  . A bad rash all over body  . Dizziness and weakness   Immunizations Administered    Name Date Dose VIS Date Route   PFIZER Comrnaty(Gray TOP) Covid-19 Vaccine 11/08/2020  3:22 PM 0.3 mL 06/29/2020 Intramuscular   Manufacturer: [employer]   Lot: [id number]   NDC: [phone]

## 2020-11-08 NOTE — Patient Instructions (Signed)
Carfilzomib injection What is this medicine? CARFILZOMIB (kar FILZ oh mib) targets a specific protein within cancer cells and stops the cancer cells from growing. It is used to treat multiple myeloma. This medicine may be used for other purposes; ask your health care provider or pharmacist if you have questions. COMMON BRAND NAME(S): KYPROLIS What should I tell my health care provider before I take this medicine? They need to know if you have any of these conditions:  heart disease  history of blood clots  irregular heartbeat  kidney disease  liver disease  lung or breathing disease  an unusual or allergic reaction to carfilzomib, or other medicines, foods, dyes, or preservatives  pregnant or trying to get pregnant  breast-feeding How should I use this medicine? This medicine is for injection or infusion into a vein. It is given by a health care professional in a hospital or clinic setting. Talk to your pediatrician regarding the use of this medicine in children. Special care may be needed. Overdosage: If you think you have taken too much of this medicine contact a poison control center or emergency room at once. NOTE: This medicine is only for you. Do not share this medicine with others. What if I miss a dose? It is important not to miss your dose. Call your doctor or health care professional if you are unable to keep an appointment. What may interact with this medicine? Interactions are not expected. Give your health care provider a list of all the medicines, herbs, non-prescription drugs, or dietary supplements you use. Also tell them if you smoke, drink alcohol, or use illegal drugs. Some items may interact with your medicine. This list may not describe all possible interactions. Give your health care provider a list of all the medicines, herbs, non-prescription drugs, or dietary supplements you use. Also tell them if you smoke, drink alcohol, or use illegal drugs. Some items  may interact with your medicine. What should I watch for while using this medicine? Your condition will be monitored carefully while you are receiving this medicine. Report any side effects. Continue your course of treatment even though you feel ill unless your doctor tells you to stop. You may need blood work done while you are taking this medicine. Do not become pregnant while taking this medicine or for at least 6 months after stopping it. Women should inform their doctor if they wish to become pregnant or think they might be pregnant. There is a potential for serious side effects to an unborn child. Men should not father a child while taking this medicine and for at least 3 months after stopping it. Talk to your health care professional or pharmacist for more information. Do not breast-feed an infant while taking this medicine or for 2 weeks after the last dose. Check with your doctor or health care professional if you get an attack of severe diarrhea, nausea and vomiting, or if you sweat a lot. The loss of too much body fluid can make it dangerous for you to take this medicine. You may get dizzy. Do not drive, use machinery, or do anything that needs mental alertness until you know how this medicine affects you. Do not stand or sit up quickly, especially if you are an older patient. This reduces the risk of dizzy or fainting spells. What side effects may I notice from receiving this medicine? Side effects that you should report to your doctor or health care professional as soon as possible:  allergic reactions like skin  rash, itching or hives, swelling of the face, lips, or tongue  confusion  dizziness  feeling faint or lightheaded  fever or chills  palpitations  seizures  signs and symptoms of bleeding such as bloody or black, tarry stools; red or dark-brown urine; spitting up blood or brown material that looks like coffee grounds; red spots on the skin; unusual bruising or bleeding  including from the eye, gums, or nose  signs and symptoms of a blood clot such as breathing problems; changes in vision; chest pain; severe, sudden headache; pain, swelling, warmth in the leg; trouble speaking; sudden numbness or weakness of the face, arm or leg  signs and symptoms of kidney injury like trouble passing urine or change in the amount of urine  signs and symptoms of liver injury like dark yellow or brown urine; general ill feeling or flu-like symptoms; light-colored stools; loss of appetite; nausea; right upper belly pain; unusually weak or tired; yellowing of the eyes or skin Side effects that usually do not require medical attention (report to your doctor or health care professional if they continue or are bothersome):  back pain  cough  diarrhea  headache  muscle cramps  trouble sleeping  vomiting This list may not describe all possible side effects. Call your doctor for medical advice about side effects. You may report side effects to FDA at 1-800-FDA-1088. Where should I keep my medicine? This drug is given in a hospital or clinic and will not be stored at home. NOTE: This sheet is a summary. It may not cover all possible information. If you have questions about this medicine, talk to your doctor, pharmacist, or health care provider.  2021 [employer]/Gold Standard (2019-03-15 19:44:21) Daratumumab injection What is this medicine? DARATUMUMAB (dar a toom ue mab) is a monoclonal antibody. It is used to treat multiple myeloma. This medicine may be used for other purposes; ask your health care provider or pharmacist if you have questions. COMMON BRAND NAME(S): DARZALEX What should I tell my health care provider before I take this medicine? They need to know if you have any of these conditions:  hereditary fructose intolerance  infection (especially a virus infection such as chickenpox, herpes, or hepatitis B virus)  lung or breathing disease (asthma, COPD)  an  unusual or allergic reaction to daratumumab, sorbitol, other medicines, foods, dyes, or preservatives  pregnant or trying to get pregnant  breast-feeding How should I use this medicine? This medicine is for infusion into a vein. It is given by a health care professional in a hospital or clinic setting. Talk to your pediatrician regarding the use of this medicine in children. Special care may be needed. Overdosage: If you think you have taken too much of this medicine contact a poison control center or emergency room at once. NOTE: This medicine is only for you. Do not share this medicine with others. What if I miss a dose? Keep appointments for follow-up doses as directed. It is important not to miss your dose. Call your doctor or health care professional if you are unable to keep an appointment. What may interact with this medicine? Interactions have not been studied. This list may not describe all possible interactions. Give your health care provider a list of all the medicines, herbs, non-prescription drugs, or dietary supplements you use. Also tell them if you smoke, drink alcohol, or use illegal drugs. Some items may interact with your medicine. What should I watch for while using this medicine? Your condition will be monitored  carefully while you are receiving this medicine. This medicine can cause serious allergic reactions. To reduce your risk, your health care provider may give you other medicine to take before receiving this one. Be sure to follow the directions from your health care provider. This medicine can affect the results of blood tests to match your blood type. These changes can last for up to 6 months after the final dose. Your healthcare provider will do blood tests to match your blood type before you start treatment. Tell all of your healthcare providers that you are being treated with this medicine before receiving a blood transfusion. This medicine can affect the results of  some tests used to determine treatment response; extra tests may be needed to evaluate response. Do not become pregnant while taking this medicine or for 3 months after stopping it. Women should inform their health care provider if they wish to become pregnant or think they might be pregnant. There is a potential for serious side effects to an unborn child. Talk to your health care provider for more information. Do not breast-feed an infant while taking this medicine. What side effects may I notice from receiving this medicine? Side effects that you should report to your doctor or health care professional as soon as possible:  allergic reactions (skin rash; itching or hives; swelling of the face, lips, or tongue)  infection (fever, chills, cough, sore throat, pain or difficulty passing urine)  infusion reaction (dizziness, fast heartbeat, feeling faint or lightheaded, falls, headache, increase in blood pressure, nausea, vomiting, or wheezing or trouble breathing with loud or whistling sounds)  unusual bleeding or bruising Side effects that usually do not require medical attention (report to your doctor or health care professional if they continue or are bothersome):  constipation  diarrhea  pain, tingling, numbness in the hands or feet  swelling of the ankles, feet, hands  tiredness This list may not describe all possible side effects. Call your doctor for medical advice about side effects. You may report side effects to FDA at 1-800-FDA-1088. Where should I keep my medicine? This drug is given in a hospital or clinic and will not be stored at home. NOTE: This sheet is a summary. It may not cover all possible information. If you have questions about this medicine, talk to your doctor, pharmacist, or health care provider.  2021 Elsevier/Gold Standard (2020-06-29 13:28:52)  

## 2020-11-10 ENCOUNTER — Other Ambulatory Visit (HOSPITAL_BASED_OUTPATIENT_CLINIC_OR_DEPARTMENT_OTHER): Payer: Self-pay

## 2020-11-10 MED ORDER — PFIZER-BIONT COVID-19 VAC-TRIS 30 MCG/0.3ML IM SUSP
INTRAMUSCULAR | 0 refills | Status: DC
  Filled 2020-11-10: qty 0.3, 1d supply, fill #0

## 2020-11-23 ENCOUNTER — Inpatient Hospital Stay (HOSPITAL_BASED_OUTPATIENT_CLINIC_OR_DEPARTMENT_OTHER): Payer: PPO | Admitting: Hematology & Oncology

## 2020-11-23 ENCOUNTER — Telehealth: Payer: Self-pay

## 2020-11-23 ENCOUNTER — Other Ambulatory Visit: Payer: Self-pay

## 2020-11-23 ENCOUNTER — Inpatient Hospital Stay: Payer: PPO

## 2020-11-23 ENCOUNTER — Inpatient Hospital Stay: Payer: PPO | Attending: Hematology & Oncology

## 2020-11-23 VITALS — BP 119/67 | HR 88 | Temp 98.2°F | Resp 20 | Wt 108.1 lb

## 2020-11-23 DIAGNOSIS — Z79899 Other long term (current) drug therapy: Secondary | ICD-10-CM | POA: Diagnosis not present

## 2020-11-23 DIAGNOSIS — Z5112 Encounter for antineoplastic immunotherapy: Secondary | ICD-10-CM | POA: Diagnosis not present

## 2020-11-23 DIAGNOSIS — C9000 Multiple myeloma not having achieved remission: Secondary | ICD-10-CM

## 2020-11-23 LAB — CBC WITH DIFFERENTIAL (CANCER CENTER ONLY)
Abs Immature Granulocytes: 0.03 10*3/uL (ref 0.00–0.07)
Basophils Absolute: 0 10*3/uL (ref 0.0–0.1)
Basophils Relative: 0 %
Eosinophils Absolute: 0.1 10*3/uL (ref 0.0–0.5)
Eosinophils Relative: 1 %
HCT: 38.2 % (ref 36.0–46.0)
Hemoglobin: 12.7 g/dL (ref 12.0–15.0)
Immature Granulocytes: 1 %
Lymphocytes Relative: 15 %
Lymphs Abs: 0.9 10*3/uL (ref 0.7–4.0)
MCH: 36.1 pg — ABNORMAL HIGH (ref 26.0–34.0)
MCHC: 33.2 g/dL (ref 30.0–36.0)
MCV: 108.5 fL — ABNORMAL HIGH (ref 80.0–100.0)
Monocytes Absolute: 0.5 10*3/uL (ref 0.1–1.0)
Monocytes Relative: 8 %
Neutro Abs: 4.4 10*3/uL (ref 1.7–7.7)
Neutrophils Relative %: 75 %
Platelet Count: 233 10*3/uL (ref 150–400)
RBC: 3.52 MIL/uL — ABNORMAL LOW (ref 3.87–5.11)
RDW: 13.2 % (ref 11.5–15.5)
WBC Count: 5.9 10*3/uL (ref 4.0–10.5)
nRBC: 0 % (ref 0.0–0.2)

## 2020-11-23 LAB — CMP (CANCER CENTER ONLY)
ALT: 16 U/L (ref 0–44)
AST: 17 U/L (ref 15–41)
Albumin: 3.9 g/dL (ref 3.5–5.0)
Alkaline Phosphatase: 66 U/L (ref 38–126)
Anion gap: 9 (ref 5–15)
BUN: 15 mg/dL (ref 8–23)
CO2: 25 mmol/L (ref 22–32)
Calcium: 9.2 mg/dL (ref 8.9–10.3)
Chloride: 106 mmol/L (ref 98–111)
Creatinine: 0.7 mg/dL (ref 0.44–1.00)
GFR, Estimated: >60 mL/min (ref >60)
Glucose, Bld: 153 mg/dL — ABNORMAL HIGH (ref 70–99)
Potassium: 4.1 mmol/L (ref 3.5–5.1)
Sodium: 140 mmol/L (ref 135–145)
Total Bilirubin: 0.4 mg/dL (ref 0.3–1.2)
Total Protein: 5.7 g/dL — ABNORMAL LOW (ref 6.5–8.1)

## 2020-11-23 MED ORDER — SODIUM CHLORIDE 0.9 % IV SOLN
Freq: Once | INTRAVENOUS | Status: AC
  Filled 2020-11-23: qty 250

## 2020-11-23 MED ORDER — DENOSUMAB 120 MG/1.7ML ~~LOC~~ SOLN
120.0000 mg | Freq: Once | SUBCUTANEOUS | Status: AC
Start: 2020-11-23 — End: 2020-11-23
  Administered 2020-11-23: 120 mg via SUBCUTANEOUS

## 2020-11-23 MED ORDER — SODIUM CHLORIDE 0.9 % IV SOLN
40.0000 mg | Freq: Once | INTRAVENOUS | Status: AC
  Administered 2020-11-23: 40 mg via INTRAVENOUS
  Filled 2020-11-23: qty 4

## 2020-11-23 MED ORDER — DENOSUMAB 120 MG/1.7ML ~~LOC~~ SOLN
SUBCUTANEOUS | Status: AC
  Filled 2020-11-23: qty 1.7

## 2020-11-23 MED ORDER — DEXTROSE 5 % IV SOLN
70.0000 mg/m2 | Freq: Once | INTRAVENOUS | Status: AC
  Administered 2020-11-23: 100 mg via INTRAVENOUS
  Filled 2020-11-23: qty 30

## 2020-11-23 NOTE — Patient Instructions (Signed)
[facility] Discharge Instructions for Patients Receiving Chemotherapy  Today you received the following chemotherapy agents Kyprolis  To help prevent nausea and vomiting after your treatment, we encourage you to take your nausea medication as prescribed by MD.   If you develop nausea and vomiting that is not controlled by your nausea medication, call the clinic.   BELOW ARE SYMPTOMS THAT SHOULD BE REPORTED IMMEDIATELY:  *FEVER GREATER THAN 100.5 F  *CHILLS WITH OR WITHOUT FEVER  NAUSEA AND VOMITING THAT IS NOT CONTROLLED WITH YOUR NAUSEA MEDICATION  *UNUSUAL SHORTNESS OF BREATH  *UNUSUAL BRUISING OR BLEEDING  TENDERNESS IN MOUTH AND THROAT WITH OR WITHOUT PRESENCE OF ULCERS  *URINARY PROBLEMS  *BOWEL PROBLEMS  UNUSUAL RASH Items with * indicate a potential emergency and should be followed up as soon as possible.  Feel free to call the clinic should you have any questions or concerns. The clinic phone number is [phone].  Please show the CHEMO ALERT CARD at check-in to the Emergency Department and triage nurse.   

## 2020-11-23 NOTE — Progress Notes (Signed)
Hematology and Oncology Follow Up Visit  Debra Ruiz [id number] 04-08-1946 75 y.o. 11/23/2020   Principle Diagnosis:  IgG Kappa myeloma - compression fractures - 13q-, +11, 14q+ - hyperdiploid -- progressive Iron def anemia  Completed Therapy: S/p Kyphoplasty of T11 and T12 XRT to lower spine-completed on 09/25/2016  Current Therapy:    Faspro -- start on 04/05/2020 -- s/p cycle #5 -- Kyprolis added on 07/20/2020 -- s/p cycle #8 Ninlaro 2.3 mg po q week (3 on/1 off) -- start on 01/24/2020 -- d/c on 03/29/2020 due to toxicity Velcade/Pomalyst/Decadron -Start on 09/02/2018 --Pomalyst on hold since 11/09/2018.  D/C on 01/17/2020 Xgeva 127m Sq q  3 month - next dose in 02/2021  IV Injectafer -- last dose given on 11/2018   Interim History:  Ms. JFreemanis here today for follow-up.  So far, she is doing okay.  She is responding okay.  However, fatigue seems to be her biggest problem.  Another big problem is that she is having lower back pain.  She is having some sciatica.  She has had this before.  She is having pain in the right leg.  She does have a orthopedist-Dr. DLynann Bologna  She says she will give them a call when she feels that he can help.  As far as the myeloma is concerned, her last M spike back in April showed a level of 0.2 g/dL.  The IgG level was 390 mg/dL.  The Kappa light chain was 0.6 mg/dL.  She has had no fever.  She and her husband are going to the beach next week.  They are looking forward to this.  They will be taking their cat.  She has had no change in bowel or bladder habits.  There has been no diarrhea.  She has had no rashes.  There is been no headache.  She has had no cough or shortness of breath.  Overall, performance status is ECOG 2.   Medications:  Allergies as of 11/23/2020      Reactions   Crestor [rosuvastatin Calcium]    Causes liver functions to be elevated      Medication List       Accurate as of Nov 23, 2020  9:08 AM. If you have any  questions, ask your nurse or doctor.        STOP taking these medications   lidocaine-prilocaine cream Commonly known as: EMLA Stopped by: PVolanda Napoleon MD   Pfizer-BioNT COVID-19 Vac-TriS Susp injection Generic drug: COVID-19 mRNA Vac-TriS (Therapist, music Stopped by: PVolanda Napoleon MD     TAKE these medications   ALPRAZolam 0.25 MG tablet Commonly known as: XANAX Take 1 tablet (0.25 mg total) by mouth 2 (two) times daily as needed. for anxiety   amLODipine 5 MG tablet Commonly known as: NORVASC TAKE 1 TABLET(5 MG) BY MOUTH DAILY   aspirin 81 MG EC tablet Take 81 mg by mouth every other day.   atorvastatin 20 MG tablet Commonly known as: LIPITOR TAKE 1 TABLET(20 MG) BY MOUTH DAILY   buPROPion 300 MG 24 hr tablet Commonly known as: Wellbutrin XL Take 1 tablet (300 mg total) by mouth daily.   dexamethasone 4 MG tablet Commonly known as: DECADRON TAKE 3 TABLETS BY MOUTH 1 TIME A WEEK   escitalopram 20 MG tablet Commonly known as: LEXAPRO TAKE 1 TABLET(20 MG) BY MOUTH DAILY   famciclovir 250 MG tablet Commonly known as: FAMVIR TAKE 1 TABLET(250 MG) BY MOUTH DAILY   IRON PO Take  by mouth daily.   LORazepam 0.5 MG tablet Commonly known as: Ativan Take 1 tablet (0.5 mg total) by mouth every 6 (six) hours as needed (Nausea or vomiting).   nebivolol 5 MG tablet Commonly known as: Bystolic TAKE 1 TABLET(5 MG) BY MOUTH DAILY   ondansetron 8 MG tablet Commonly known as: Zofran Take 1 tablet (8 mg total) by mouth 2 (two) times daily as needed (Nausea or vomiting).   OVER THE COUNTER MEDICATION Dynamic Brain - 2 capsules at night.   potassium chloride SA 20 MEQ tablet Commonly known as: KLOR-CON TAKE 1 TABLET(20 MEQ) BY MOUTH DAILY   prochlorperazine 10 MG tablet Commonly known as: COMPAZINE TAKE 1 TABLET(10 MG) BY MOUTH EVERY 6 HOURS AS NEEDED FOR NAUSEA OR VOMITING   REFRESH DRY EYE THERAPY OP Apply to eye 3 (three) times daily.   temazepam 7.5 MG  capsule Commonly known as: RESTORIL TAKE 1 CAPSULE(7.5 MG) BY MOUTH AT BEDTIME AS NEEDED FOR SLEEP   Vitamin B-12 5000 MCG Tbdp Take by mouth daily.   Vitamin D 50 MCG (2000 UT) Caps Take 2,000 Units by mouth daily.       Allergies:  Allergies  Allergen Reactions  . Crestor [Rosuvastatin Calcium]     Causes liver functions to be elevated    Past Medical History, Surgical history, Social history, and Family History were reviewed and updated.  Review of Systems: Review of Systems  Constitutional: Negative.   HENT: Negative.   Eyes: Negative.   Respiratory: Negative.   Cardiovascular: Negative.   Gastrointestinal: Negative.   Genitourinary: Negative.   Musculoskeletal: Positive for back pain.  Skin: Negative.   Neurological: Negative.   Endo/Heme/Allergies: Negative.   Psychiatric/Behavioral: Negative.     Physical Exam:  weight is 108 lb 1.9 oz (49 kg). Her temperature is 98.2 F (36.8 C). Her blood pressure is 119/67 and her pulse is 88. Her respiration is 20.   Wt Readings from Last 3 Encounters:  11/23/20 108 lb 1.9 oz (49 kg)  10/25/20 106 lb 12.8 oz (48.4 kg)  09/13/20 112 lb (50.8 kg)    Physical Exam Vitals reviewed.  HENT:     Head: Normocephalic and atraumatic.  Eyes:     Pupils: Pupils are equal, round, and reactive to light.  Cardiovascular:     Rate and Rhythm: Normal rate and regular rhythm.     Heart sounds: Normal heart sounds.  Pulmonary:     Effort: Pulmonary effort is normal.     Breath sounds: Normal breath sounds.  Abdominal:     General: Bowel sounds are normal.     Palpations: Abdomen is soft.  Musculoskeletal:        General: No tenderness or deformity. Normal range of motion.     Cervical back: Normal range of motion.  Lymphadenopathy:     Cervical: No cervical adenopathy.  Skin:    General: Skin is warm and dry.     Findings: No erythema or rash.  Neurological:     Mental Status: She is alert and oriented to person, place,  and time.  Psychiatric:        Behavior: Behavior normal.        Thought Content: Thought content normal.        Judgment: Judgment normal.       Lab Results  Component Value Date   WBC 5.9 11/23/2020   HGB 12.7 11/23/2020   HCT 38.2 11/23/2020   MCV 108.5 (H) 11/23/2020  PLT 233 11/23/2020   Lab Results  Component Value Date   FERRITIN 258 10/25/2020   IRON 136 10/25/2020   TIBC 353 10/25/2020   UIBC 216 10/25/2020   IRONPCTSAT 39 10/25/2020   Lab Results  Component Value Date   RBC 3.52 (L) 11/23/2020   Lab Results  Component Value Date   KPAFRELGTCHN 6.0 10/25/2020   LAMBDASER <1.5 (L) 10/25/2020   KAPLAMBRATIO >4.00 (H) 10/25/2020   Lab Results  Component Value Date   IGGSERUM 388 (L) 10/25/2020   IGGSERUM 404 (L) 10/25/2020   IGA <5 (L) 10/25/2020   IGA <5 (L) 10/25/2020   IGMSERUM 11 (L) 10/25/2020   IGMSERUM 12 (L) 10/25/2020   Lab Results  Component Value Date   TOTALPROTELP 5.9 (L) 10/25/2020   ALBUMINELP 3.7 10/25/2020   A1GS 0.3 10/25/2020   A2GS 0.6 10/25/2020   BETS 1.0 10/25/2020   GAMS 0.3 (L) 10/25/2020   MSPIKE 0.2 (H) 10/25/2020   SPEI Comment 01/28/2018     Chemistry      Component Value Date/Time   NA 141 11/08/2020 1157   NA 148 (H) 07/16/2017 1128   NA 143 03/05/2017 1051   K 4.1 11/08/2020 1157   K 3.8 07/16/2017 1128   K 3.7 03/05/2017 1051   CL 106 11/08/2020 1157   CL 106 07/16/2017 1128   CO2 27 11/08/2020 1157   CO2 29 07/16/2017 1128   CO2 25 03/05/2017 1051   BUN 23 11/08/2020 1157   BUN 23 (H) 07/16/2017 1128   BUN 18.2 03/05/2017 1051   CREATININE 0.69 11/08/2020 1157   CREATININE 0.75 06/20/2020 1127   CREATININE 0.8 03/05/2017 1051      Component Value Date/Time   CALCIUM 9.4 11/08/2020 1157   CALCIUM 9.7 07/16/2017 1128   CALCIUM 8.9 03/05/2017 1051   ALKPHOS 62 11/08/2020 1157   ALKPHOS 71 07/16/2017 1128   ALKPHOS 67 03/05/2017 1051   AST 16 11/08/2020 1157   AST 15 03/05/2017 1051   ALT 19  11/08/2020 1157   ALT 23 07/16/2017 1128   ALT 17 03/05/2017 1051   BILITOT 0.4 11/08/2020 1157   BILITOT 0.55 03/05/2017 1051        Impression and Plan: Ms. Rayl is a very pleasant 75 yo caucasian female with IgG Kappa Myeloma.    We clearly have seen a response with Faspro/Kyprolis combination.  She is having more fatigue with treatment.  Hopefully, once we get to the monthly treatment, we will be able to see her fatigue improve.  If she did need to have anything done to her back, I will see any problems from my point of view.  I just want her quality of life to be good.  We will plan to get her back in late May.    [name], MD 5/5/20229:08 AM

## 2020-11-23 NOTE — Telephone Encounter (Signed)
appts made per 11/23/20 los and pt will gain her sch in tx/avs and through [employer]

## 2020-11-24 LAB — IGG, IGA, IGM
IgA: <5 mg/dL — ABNORMAL LOW (ref 64–422)
IgG (Immunoglobin G), Serum: 348 mg/dL — ABNORMAL LOW (ref 586–1602)
IgM (Immunoglobulin M), Srm: 17 mg/dL — ABNORMAL LOW (ref 26–217)

## 2020-11-24 LAB — KAPPA/LAMBDA LIGHT CHAINS
Kappa free light chain: 12.7 mg/L (ref 3.3–19.4)
Kappa, lambda light chain ratio: 7.06 — ABNORMAL HIGH (ref 0.26–1.65)
Lambda free light chains: 1.8 mg/L — ABNORMAL LOW (ref 5.7–26.3)

## 2020-11-28 ENCOUNTER — Telehealth: Payer: Self-pay

## 2020-11-28 LAB — PROTEIN ELECTROPHORESIS, SERUM, WITH REFLEX
A/G Ratio: 1.7 (ref 0.7–1.7)
Albumin ELP: 3.4 g/dL (ref 2.9–4.4)
Alpha-1-Globulin: 0.2 g/dL (ref 0.0–0.4)
Alpha-2-Globulin: 0.6 g/dL (ref 0.4–1.0)
Beta Globulin: 0.8 g/dL (ref 0.7–1.3)
Gamma Globulin: 0.3 g/dL — ABNORMAL LOW (ref 0.4–1.8)
Globulin, Total: 2 g/dL — ABNORMAL LOW (ref 2.2–3.9)
M-Spike, %: 0.2 g/dL — ABNORMAL HIGH
SPEP Interpretation: 0
Total Protein ELP: 5.4 g/dL — ABNORMAL LOW (ref 6.0–8.5)

## 2020-11-28 LAB — IMMUNOFIXATION REFLEX, SERUM
IgA: <5 mg/dL — ABNORMAL LOW (ref 64–422)
IgG (Immunoglobin G), Serum: 361 mg/dL — ABNORMAL LOW (ref 586–1602)
IgM (Immunoglobulin M), Srm: 17 mg/dL — ABNORMAL LOW (ref 26–217)

## 2020-11-28 NOTE — Telephone Encounter (Signed)
recd a message that she was not aware of her appts, called pt and she is now aware and we made an additional chemo for 6/9   Debra Ruiz

## 2020-12-12 ENCOUNTER — Other Ambulatory Visit: Payer: Self-pay | Admitting: Hematology & Oncology

## 2020-12-12 DIAGNOSIS — C9000 Multiple myeloma not having achieved remission: Secondary | ICD-10-CM

## 2020-12-12 DIAGNOSIS — R5383 Other fatigue: Secondary | ICD-10-CM

## 2020-12-14 ENCOUNTER — Telehealth: Payer: Self-pay

## 2020-12-14 ENCOUNTER — Other Ambulatory Visit: Payer: Self-pay

## 2020-12-14 ENCOUNTER — Encounter: Payer: Self-pay | Admitting: Hematology & Oncology

## 2020-12-14 ENCOUNTER — Inpatient Hospital Stay (HOSPITAL_BASED_OUTPATIENT_CLINIC_OR_DEPARTMENT_OTHER): Payer: PPO | Admitting: Hematology & Oncology

## 2020-12-14 ENCOUNTER — Inpatient Hospital Stay: Payer: PPO

## 2020-12-14 VITALS — BP 125/65 | HR 76 | Temp 98.1°F | Resp 18 | Ht 60.0 in | Wt 108.0 lb

## 2020-12-14 DIAGNOSIS — Z5112 Encounter for antineoplastic immunotherapy: Secondary | ICD-10-CM | POA: Diagnosis not present

## 2020-12-14 DIAGNOSIS — C9000 Multiple myeloma not having achieved remission: Secondary | ICD-10-CM

## 2020-12-14 LAB — CBC WITH DIFFERENTIAL (CANCER CENTER ONLY)
Abs Immature Granulocytes: 0.01 10*3/uL (ref 0.00–0.07)
Basophils Absolute: 0 10*3/uL (ref 0.0–0.1)
Basophils Relative: 0 %
Eosinophils Absolute: 0.1 10*3/uL (ref 0.0–0.5)
Eosinophils Relative: 1 %
HCT: 40.1 % (ref 36.0–46.0)
Hemoglobin: 13.2 g/dL (ref 12.0–15.0)
Immature Granulocytes: 0 %
Lymphocytes Relative: 13 %
Lymphs Abs: 0.8 10*3/uL (ref 0.7–4.0)
MCH: 35.5 pg — ABNORMAL HIGH (ref 26.0–34.0)
MCHC: 32.9 g/dL (ref 30.0–36.0)
MCV: 107.8 fL — ABNORMAL HIGH (ref 80.0–100.0)
Monocytes Absolute: 0.6 10*3/uL (ref 0.1–1.0)
Monocytes Relative: 10 %
Neutro Abs: 4.7 10*3/uL (ref 1.7–7.7)
Neutrophils Relative %: 76 %
Platelet Count: 200 10*3/uL (ref 150–400)
RBC: 3.72 MIL/uL — ABNORMAL LOW (ref 3.87–5.11)
RDW: 12.2 % (ref 11.5–15.5)
WBC Count: 6.2 10*3/uL (ref 4.0–10.5)
nRBC: 0 % (ref 0.0–0.2)

## 2020-12-14 LAB — CMP (CANCER CENTER ONLY)
ALT: 14 U/L (ref 0–44)
AST: 15 U/L (ref 15–41)
Albumin: 4 g/dL (ref 3.5–5.0)
Alkaline Phosphatase: 59 U/L (ref 38–126)
Anion gap: 9 (ref 5–15)
BUN: 16 mg/dL (ref 8–23)
CO2: 25 mmol/L (ref 22–32)
Calcium: 9.3 mg/dL (ref 8.9–10.3)
Chloride: 107 mmol/L (ref 98–111)
Creatinine: 0.72 mg/dL (ref 0.44–1.00)
GFR, Estimated: >60 mL/min (ref >60)
Glucose, Bld: 125 mg/dL — ABNORMAL HIGH (ref 70–99)
Potassium: 4.2 mmol/L (ref 3.5–5.1)
Sodium: 141 mmol/L (ref 135–145)
Total Bilirubin: 0.6 mg/dL (ref 0.3–1.2)
Total Protein: 5.6 g/dL — ABNORMAL LOW (ref 6.5–8.1)

## 2020-12-14 LAB — LACTATE DEHYDROGENASE: LDH: 200 U/L — ABNORMAL HIGH (ref 98–192)

## 2020-12-14 MED ORDER — ACETAMINOPHEN 325 MG PO TABS: 650.0000 mg | ORAL_TABLET | Freq: Once | ORAL | Status: DC

## 2020-12-14 MED ORDER — SODIUM CHLORIDE 0.9 % IV SOLN
40.0000 mg | Freq: Once | INTRAVENOUS | Status: AC
  Administered 2020-12-14: 40 mg via INTRAVENOUS
  Filled 2020-12-14: qty 4

## 2020-12-14 MED ORDER — SODIUM CHLORIDE 0.9 % IV SOLN
Freq: Once | INTRAVENOUS | Status: AC
Start: 2020-12-14 — End: 2020-12-14
  Filled 2020-12-14: qty 250

## 2020-12-14 MED ORDER — DARATUMUMAB-HYALURONIDASE-FIHJ 1800-30000 MG-UT/15ML ~~LOC~~ SOLN
1800.0000 mg | Freq: Once | SUBCUTANEOUS | Status: AC
  Administered 2020-12-14: 1800 mg via SUBCUTANEOUS
  Filled 2020-12-14: qty 15

## 2020-12-14 MED ORDER — DEXAMETHASONE 4 MG PO TABS: 8.0000 mg | ORAL_TABLET | Freq: Once | ORAL | Status: DC

## 2020-12-14 MED ORDER — DIPHENHYDRAMINE HCL 25 MG PO CAPS: 25.0000 mg | ORAL_CAPSULE | Freq: Once | ORAL | Status: DC

## 2020-12-14 MED ORDER — DEXTROSE 5 % IV SOLN
70.0000 mg/m2 | Freq: Once | INTRAVENOUS | Status: AC
  Administered 2020-12-14: 100 mg via INTRAVENOUS
  Filled 2020-12-14: qty 30

## 2020-12-14 MED ORDER — SODIUM CHLORIDE 0.9 % IV SOLN
Freq: Once | INTRAVENOUS | Status: AC
  Filled 2020-12-14: qty 250

## 2020-12-14 NOTE — Progress Notes (Signed)
Hematology and Oncology Follow Up Visit  Debra Ruiz [id number] 05-29-1946 75 y.o. 12/14/2020   Principle Diagnosis:  IgG Kappa myeloma - compression fractures - 13q-, +11, 14q+ - hyperdiploid -- progressive Iron def anemia  Completed Therapy: S/p Kyphoplasty of T11 and T12 XRT to lower spine-completed on 09/25/2016  Current Therapy:    Faspro -- start on 04/05/2020 -- s/p cycle #5 -- Kyprolis added on 07/20/2020 -- s/p cycle #9 Ninlaro 2.3 mg po q week (3 on/1 off) -- start on 01/24/2020 -- d/c on 03/29/2020 due to toxicity Velcade/Pomalyst/Decadron -Start on 09/02/2018 --Pomalyst on hold since 11/09/2018.  D/C on 01/17/2020 Xgeva $RemoveBeforeDE'120mg'UFWntpibveEMfRU$  Sq q  3 month - next dose in 02/2021  IV Injectafer -- last dose given on 11/2018   Interim History:  Ms. Wos is here today for follow-up.  She is not feeling all that well.  She does has a lot of fatigue.  She feels that she is mentally wearing down.  I know that she is trying her hardest.  As far as the myeloma is concerned, everything's been holding pretty steady.  Her last M spike was 0.2 g/dL.  The IgG level was 350 mg/dL.  The Kappa light chain was 1.3 mg/dL.  She went to the beach.  They she did not have a good time at the beach.  It was cold and windy.  Her back is bothering her.  She is try to put off having to see Dr. Lynann Bologna.  He says that she needs to have surgery.  She does not want to have surgery on her back.  Her appetite is doing okay.  She is hopefully going have a nice Memorial Day cookout with friends.  She has had no problems with fever.  There is no cough.  There is no change in bowel or bladder habits.  She has had no nausea or vomiting.  She had no leg swelling.  Overall, her performance status is ECOG 1.    Medications:  Allergies as of 12/14/2020      Reactions   Crestor [rosuvastatin Calcium]    Causes liver functions to be elevated      Medication List       Accurate as of Dec 14, 2020  9:07 AM. If you  have any questions, ask your nurse or doctor.        ALPRAZolam 0.25 MG tablet Commonly known as: XANAX Take 1 tablet (0.25 mg total) by mouth 2 (two) times daily as needed. for anxiety   amLODipine 5 MG tablet Commonly known as: NORVASC TAKE 1 TABLET(5 MG) BY MOUTH DAILY   aspirin 81 MG EC tablet Take 81 mg by mouth every other day.   atorvastatin 20 MG tablet Commonly known as: LIPITOR TAKE 1 TABLET(20 MG) BY MOUTH DAILY   buPROPion 300 MG 24 hr tablet Commonly known as: Wellbutrin XL Take 1 tablet (300 mg total) by mouth daily.   dexamethasone 4 MG tablet Commonly known as: DECADRON TAKE 3 TABLETS BY MOUTH 1 TIME A WEEK   escitalopram 20 MG tablet Commonly known as: LEXAPRO TAKE 1 TABLET(20 MG) BY MOUTH DAILY   famciclovir 250 MG tablet Commonly known as: FAMVIR TAKE 1 TABLET(250 MG) BY MOUTH DAILY   IRON PO Take by mouth daily.   LORazepam 0.5 MG tablet Commonly known as: Ativan Take 1 tablet (0.5 mg total) by mouth every 6 (six) hours as needed (Nausea or vomiting).   nebivolol 5 MG tablet Commonly known as: Journalist, newspaper  TAKE 1 TABLET(5 MG) BY MOUTH DAILY   ondansetron 8 MG tablet Commonly known as: Zofran Take 1 tablet (8 mg total) by mouth 2 (two) times daily as needed (Nausea or vomiting).   OVER THE COUNTER MEDICATION Dynamic Brain - 2 capsules at night.   potassium chloride SA 20 MEQ tablet Commonly known as: KLOR-CON TAKE 1 TABLET(20 MEQ) BY MOUTH DAILY   prochlorperazine 10 MG tablet Commonly known as: COMPAZINE TAKE 1 TABLET(10 MG) BY MOUTH EVERY 6 HOURS AS NEEDED FOR NAUSEA OR VOMITING   REFRESH DRY EYE THERAPY OP Apply to eye 3 (three) times daily.   temazepam 7.5 MG capsule Commonly known as: RESTORIL TAKE 1 CAPSULE BY MOUTH AT BEDTIME AS NEEDED FOR SLEEP   Vitamin B-12 5000 MCG Tbdp Take by mouth daily.   Vitamin D 50 MCG (2000 UT) Caps Take 2,000 Units by mouth daily.       Allergies:  Allergies  Allergen Reactions  .  Crestor [Rosuvastatin Calcium]     Causes liver functions to be elevated    Past Medical History, Surgical history, Social history, and Family History were reviewed and updated.  Review of Systems: Review of Systems  Constitutional: Negative.   HENT: Negative.   Eyes: Negative.   Respiratory: Negative.   Cardiovascular: Negative.   Gastrointestinal: Negative.   Genitourinary: Negative.   Musculoskeletal: Positive for back pain.  Skin: Negative.   Neurological: Negative.   Endo/Heme/Allergies: Negative.   Psychiatric/Behavioral: Negative.     Physical Exam:  height is 5' (1.524 m) and weight is 108 lb (49 kg). Her oral temperature is 98.1 F (36.7 C). Her blood pressure is 125/65 and her pulse is 76. Her respiration is 18.   Wt Readings from Last 3 Encounters:  12/14/20 108 lb (49 kg)  11/23/20 108 lb 1.9 oz (49 kg)  10/25/20 106 lb 12.8 oz (48.4 kg)    Physical Exam Vitals reviewed.  HENT:     Head: Normocephalic and atraumatic.  Eyes:     Pupils: Pupils are equal, round, and reactive to light.  Cardiovascular:     Rate and Rhythm: Normal rate and regular rhythm.     Heart sounds: Normal heart sounds.  Pulmonary:     Effort: Pulmonary effort is normal.     Breath sounds: Normal breath sounds.  Abdominal:     General: Bowel sounds are normal.     Palpations: Abdomen is soft.  Musculoskeletal:        General: No tenderness or deformity. Normal range of motion.     Cervical back: Normal range of motion.  Lymphadenopathy:     Cervical: No cervical adenopathy.  Skin:    General: Skin is warm and dry.     Findings: No erythema or rash.  Neurological:     Mental Status: She is alert and oriented to person, place, and time.  Psychiatric:        Behavior: Behavior normal.        Thought Content: Thought content normal.        Judgment: Judgment normal.       Lab Results  Component Value Date   WBC 6.2 12/14/2020   HGB 13.2 12/14/2020   HCT 40.1 12/14/2020    MCV 107.8 (H) 12/14/2020   PLT 200 12/14/2020   Lab Results  Component Value Date   FERRITIN 258 10/25/2020   IRON 136 10/25/2020   TIBC 353 10/25/2020   UIBC 216 10/25/2020   IRONPCTSAT 39 10/25/2020  Lab Results  Component Value Date   RBC 3.72 (L) 12/14/2020   Lab Results  Component Value Date   KPAFRELGTCHN 12.7 11/23/2020   LAMBDASER 1.8 (L) 11/23/2020   KAPLAMBRATIO 7.06 (H) 11/23/2020   Lab Results  Component Value Date   IGGSERUM 348 (L) 11/23/2020   IGGSERUM 361 (L) 11/23/2020   IGA <5 (L) 11/23/2020   IGA <5 (L) 11/23/2020   IGMSERUM 17 (L) 11/23/2020   IGMSERUM 17 (L) 11/23/2020   Lab Results  Component Value Date   TOTALPROTELP 5.4 (L) 11/23/2020   ALBUMINELP 3.4 11/23/2020   A1GS 0.2 11/23/2020   A2GS 0.6 11/23/2020   BETS 0.8 11/23/2020   GAMS 0.3 (L) 11/23/2020   MSPIKE 0.2 (H) 11/23/2020   SPEI Comment 01/28/2018     Chemistry      Component Value Date/Time   NA 141 12/14/2020 0825   NA 148 (H) 07/16/2017 1128   NA 143 03/05/2017 1051   K 4.2 12/14/2020 0825   K 3.8 07/16/2017 1128   K 3.7 03/05/2017 1051   CL 107 12/14/2020 0825   CL 106 07/16/2017 1128   CO2 25 12/14/2020 0825   CO2 29 07/16/2017 1128   CO2 25 03/05/2017 1051   BUN 16 12/14/2020 0825   BUN 23 (H) 07/16/2017 1128   BUN 18.2 03/05/2017 1051   CREATININE 0.72 12/14/2020 0825   CREATININE 0.75 06/20/2020 1127   CREATININE 0.8 03/05/2017 1051      Component Value Date/Time   CALCIUM 9.3 12/14/2020 0825   CALCIUM 9.7 07/16/2017 1128   CALCIUM 8.9 03/05/2017 1051   ALKPHOS 59 12/14/2020 0825   ALKPHOS 71 07/16/2017 1128   ALKPHOS 67 03/05/2017 1051   AST 15 12/14/2020 0825   AST 15 03/05/2017 1051   ALT 14 12/14/2020 0825   ALT 23 07/16/2017 1128   ALT 17 03/05/2017 1051   BILITOT 0.6 12/14/2020 0825   BILITOT 0.55 03/05/2017 1051        Impression and Plan: Ms. Hagg is a very pleasant 75 yo caucasian female with IgG Kappa Myeloma.    We clearly have  seen a response with Faspro/Kyprolis combination.  We are now can go to monthly Faspro.  Maybe, this will make a difference for her.  We will go ahead and plan to get her back in 1 month.  I think she comes back in 2 weeks for Kyprolis.  Hopefully, she will still start feeling better.  Maybe with warmer weather, she will have more energy.     [name], MD 5/26/20229:07 AM

## 2020-12-14 NOTE — Patient Instructions (Signed)
[facility] [place]  Discharge Instructions: Thank you for choosing [facility] to provide your oncology and hematology care.   If you have a lab appointment with the [facility], please go directly to the [facility] and check in at the registration area.  Wear comfortable clothing and clothing appropriate for easy access to any Portacath or PICC line.   We strive to give you quality time with your provider. You may need to reschedule your appointment if you arrive late (15 or more minutes).  Arriving late affects you and other patients whose appointments are after yours.  Also, if you miss three or more appointments without notifying the office, you may be dismissed from the clinic at the provider's discretion.      For prescription refill requests, have your pharmacy contact our office and allow 72 hours for refills to be completed.    Today you received the following chemotherapy and/or immunotherapy agents Kyprolis and Faspro   To help prevent nausea and vomiting after your treatment, we encourage you to take your nausea medication as directed.  BELOW ARE SYMPTOMS THAT SHOULD BE REPORTED IMMEDIATELY: . *FEVER GREATER THAN 100.4 F (38 C) OR HIGHER . *CHILLS OR SWEATING . *NAUSEA AND VOMITING THAT IS NOT CONTROLLED WITH YOUR NAUSEA MEDICATION . *UNUSUAL SHORTNESS OF BREATH . *UNUSUAL BRUISING OR BLEEDING . *URINARY PROBLEMS (pain or burning when urinating, or frequent urination) . *BOWEL PROBLEMS (unusual diarrhea, constipation, pain near the anus) . TENDERNESS IN MOUTH AND THROAT WITH OR WITHOUT PRESENCE OF ULCERS (sore throat, sores in mouth, or a toothache) . UNUSUAL RASH, SWELLING OR PAIN  . UNUSUAL VAGINAL DISCHARGE OR ITCHING   Items with * indicate a potential emergency and should be followed up as soon as possible or go to the Emergency Department if any problems should occur.  Please show the CHEMOTHERAPY ALERT CARD or IMMUNOTHERAPY  ALERT CARD at check-in to the Emergency Department and triage nurse. Should you have questions after your visit or need to cancel or reschedule your appointment, please contact [facility]  [phone] and follow the prompts.  Office hours are 8:00 a.m. to 4:30 p.m. Monday - Friday. Please note that voicemails left after 4:00 p.m. may not be returned until the following business day.  We are closed weekends and major holidays. You have access to a nurse at all times for urgent questions. Please call the main number to the clinic [phone] and follow the prompts.  For any non-urgent questions, you may also contact your provider using MyChart. We now offer e-Visits for anyone 11 and older to request care online for non-urgent symptoms. For details visit [email or web address].   Also download the MyChart app! Go to the app store, search "MyChart", open the app, select [facility], and log in with your MyChart username and password.  Due to Covid, a mask is required upon entering the hospital/clinic. If you do not have a mask, one will be given to you upon arrival. For doctor visits, patients may have 1 support person aged 64 or older with them. For treatment visits, patients cannot have anyone with them due to current Covid guidelines and our immunocompromised population.

## 2020-12-14 NOTE — Telephone Encounter (Signed)
appts made per 12/14/20 los and pt will gain a new sch in tx/avs and through [employer]

## 2020-12-15 LAB — IGG, IGA, IGM
IgA: <5 mg/dL — ABNORMAL LOW (ref 64–422)
IgG (Immunoglobin G), Serum: 357 mg/dL — ABNORMAL LOW (ref 586–1602)
IgM (Immunoglobulin M), Srm: 23 mg/dL — ABNORMAL LOW (ref 26–217)

## 2020-12-15 LAB — KAPPA/LAMBDA LIGHT CHAINS
Kappa free light chain: 10.5 mg/L (ref 3.3–19.4)
Kappa, lambda light chain ratio: 5.83 — ABNORMAL HIGH (ref 0.26–1.65)
Lambda free light chains: 1.8 mg/L — ABNORMAL LOW (ref 5.7–26.3)

## 2020-12-19 ENCOUNTER — Telehealth: Payer: Self-pay | Admitting: *Deleted

## 2020-12-19 NOTE — Telephone Encounter (Signed)
-----   Message from [name], MD sent at 12/15/2020  6:34 PM EDT ----- Call - the light chains are stable!!!  Great news!!  [name]

## 2020-12-19 NOTE — Telephone Encounter (Signed)
Patient notified per order of Dr. Marin Olp that "the light chains are stable!!!  Great news!! Pete"  Pt appreciative of call and has no questions at this time.

## 2020-12-22 ENCOUNTER — Other Ambulatory Visit: Payer: Self-pay | Admitting: Hematology & Oncology

## 2020-12-22 DIAGNOSIS — Z1231 Encounter for screening mammogram for malignant neoplasm of breast: Secondary | ICD-10-CM | POA: Diagnosis not present

## 2020-12-22 DIAGNOSIS — C9000 Multiple myeloma not having achieved remission: Secondary | ICD-10-CM

## 2020-12-22 LAB — PROTEIN ELECTROPHORESIS, SERUM, WITH REFLEX
A/G Ratio: 1.6 (ref 0.7–1.7)
Albumin ELP: 3.5 g/dL (ref 2.9–4.4)
Alpha-1-Globulin: 0.2 g/dL (ref 0.0–0.4)
Alpha-2-Globulin: 0.8 g/dL (ref 0.4–1.0)
Beta Globulin: 0.9 g/dL (ref 0.7–1.3)
Gamma Globulin: 0.3 g/dL — ABNORMAL LOW (ref 0.4–1.8)
Globulin, Total: 2.2 g/dL (ref 2.2–3.9)
M-Spike, %: 0.2 g/dL — ABNORMAL HIGH
SPEP Interpretation: 0
Total Protein ELP: 5.7 g/dL — ABNORMAL LOW (ref 6.0–8.5)

## 2020-12-22 LAB — HM MAMMOGRAPHY

## 2020-12-22 LAB — IMMUNOFIXATION REFLEX, SERUM
IgA: <5 mg/dL — ABNORMAL LOW (ref 64–422)
IgG (Immunoglobin G), Serum: 345 mg/dL — ABNORMAL LOW (ref 586–1602)
IgM (Immunoglobulin M), Srm: 23 mg/dL — ABNORMAL LOW (ref 26–217)

## 2020-12-27 ENCOUNTER — Ambulatory Visit: Payer: PPO

## 2020-12-27 ENCOUNTER — Other Ambulatory Visit: Payer: PPO

## 2020-12-28 ENCOUNTER — Encounter: Payer: Self-pay | Admitting: Internal Medicine

## 2020-12-28 ENCOUNTER — Ambulatory Visit: Payer: PPO

## 2020-12-28 ENCOUNTER — Other Ambulatory Visit: Payer: Self-pay

## 2020-12-28 ENCOUNTER — Inpatient Hospital Stay: Payer: PPO

## 2020-12-28 ENCOUNTER — Other Ambulatory Visit: Payer: PPO

## 2020-12-28 ENCOUNTER — Inpatient Hospital Stay: Payer: PPO | Attending: Hematology & Oncology

## 2020-12-28 VITALS — BP 118/70 | HR 71 | Temp 97.9°F | Resp 16

## 2020-12-28 DIAGNOSIS — C9000 Multiple myeloma not having achieved remission: Secondary | ICD-10-CM

## 2020-12-28 DIAGNOSIS — Z79899 Other long term (current) drug therapy: Secondary | ICD-10-CM | POA: Diagnosis not present

## 2020-12-28 DIAGNOSIS — Z5112 Encounter for antineoplastic immunotherapy: Secondary | ICD-10-CM | POA: Insufficient documentation

## 2020-12-28 LAB — COMPREHENSIVE METABOLIC PANEL
ALT: 17 U/L (ref 0–44)
AST: 18 U/L (ref 15–41)
Albumin: 4.3 g/dL (ref 3.5–5.0)
Alkaline Phosphatase: 59 U/L (ref 38–126)
Anion gap: 11 (ref 5–15)
BUN: 22 mg/dL (ref 8–23)
CO2: 19 mmol/L — ABNORMAL LOW (ref 22–32)
Calcium: 10.1 mg/dL (ref 8.9–10.3)
Chloride: 107 mmol/L (ref 98–111)
Creatinine, Ser: 0.78 mg/dL (ref 0.44–1.00)
GFR, Estimated: >60 mL/min (ref >60)
Glucose, Bld: 151 mg/dL — ABNORMAL HIGH (ref 70–99)
Potassium: 4.1 mmol/L (ref 3.5–5.1)
Sodium: 137 mmol/L (ref 135–145)
Total Bilirubin: 0.7 mg/dL (ref 0.3–1.2)
Total Protein: 6 g/dL — ABNORMAL LOW (ref 6.5–8.1)

## 2020-12-28 LAB — CBC WITH DIFFERENTIAL (CANCER CENTER ONLY)
Abs Immature Granulocytes: 0.03 10*3/uL (ref 0.00–0.07)
Basophils Absolute: 0 10*3/uL (ref 0.0–0.1)
Basophils Relative: 0 %
Eosinophils Absolute: 0 10*3/uL (ref 0.0–0.5)
Eosinophils Relative: 0 %
HCT: 41.9 % (ref 36.0–46.0)
Hemoglobin: 14.2 g/dL (ref 12.0–15.0)
Immature Granulocytes: 0 %
Lymphocytes Relative: 12 %
Lymphs Abs: 1.1 10*3/uL (ref 0.7–4.0)
MCH: 35.5 pg — ABNORMAL HIGH (ref 26.0–34.0)
MCHC: 33.9 g/dL (ref 30.0–36.0)
MCV: 104.8 fL — ABNORMAL HIGH (ref 80.0–100.0)
Monocytes Absolute: 0.6 10*3/uL (ref 0.1–1.0)
Monocytes Relative: 6 %
Neutro Abs: 7.8 10*3/uL — ABNORMAL HIGH (ref 1.7–7.7)
Neutrophils Relative %: 82 %
Platelet Count: 268 10*3/uL (ref 150–400)
RBC: 4 MIL/uL (ref 3.87–5.11)
RDW: 12.2 % (ref 11.5–15.5)
WBC Count: 9.6 10*3/uL (ref 4.0–10.5)
nRBC: 0 % (ref 0.0–0.2)

## 2020-12-28 MED ORDER — DEXTROSE 5 % IV SOLN
70.0000 mg/m2 | Freq: Once | INTRAVENOUS | Status: AC
  Administered 2020-12-28: 100 mg via INTRAVENOUS
  Filled 2020-12-28: qty 5

## 2020-12-28 MED ORDER — SODIUM CHLORIDE 0.9 % IV SOLN
Freq: Once | INTRAVENOUS | Status: DC
  Filled 2020-12-28: qty 250

## 2020-12-28 MED ORDER — SODIUM CHLORIDE 0.9 % IV SOLN
Freq: Once | INTRAVENOUS | Status: AC
Start: 2020-12-28 — End: 2020-12-28
  Filled 2020-12-28: qty 250

## 2020-12-28 MED ORDER — SODIUM CHLORIDE 0.9 % IV SOLN
40.0000 mg | Freq: Once | INTRAVENOUS | Status: AC
  Administered 2020-12-28: 40 mg via INTRAVENOUS
  Filled 2020-12-28: qty 4

## 2020-12-28 NOTE — Patient Instructions (Signed)
Carfilzomib injection What is this medicine? CARFILZOMIB (kar FILZ oh mib) targets a specific protein within cancer cells and stops the cancer cells from growing. It is used to treat multiple myeloma. This medicine may be used for other purposes; ask your health care provider or pharmacist if you have questions. COMMON BRAND NAME(S): KYPROLIS What should I tell my health care provider before I take this medicine? They need to know if you have any of these conditions:  heart disease  history of blood clots  irregular heartbeat  kidney disease  liver disease  lung or breathing disease  an unusual or allergic reaction to carfilzomib, or other medicines, foods, dyes, or preservatives  pregnant or trying to get pregnant  breast-feeding How should I use this medicine? This medicine is for injection or infusion into a vein. It is given by a health care professional in a hospital or clinic setting. Talk to your pediatrician regarding the use of this medicine in children. Special care may be needed. Overdosage: If you think you have taken too much of this medicine contact a poison control center or emergency room at once. NOTE: This medicine is only for you. Do not share this medicine with others. What if I miss a dose? It is important not to miss your dose. Call your doctor or health care professional if you are unable to keep an appointment. What may interact with this medicine? Interactions are not expected. Give your health care provider a list of all the medicines, herbs, non-prescription drugs, or dietary supplements you use. Also tell them if you smoke, drink alcohol, or use illegal drugs. Some items may interact with your medicine. This list may not describe all possible interactions. Give your health care provider a list of all the medicines, herbs, non-prescription drugs, or dietary supplements you use. Also tell them if you smoke, drink alcohol, or use illegal drugs. Some items  may interact with your medicine. What should I watch for while using this medicine? Your condition will be monitored carefully while you are receiving this medicine. Report any side effects. Continue your course of treatment even though you feel ill unless your doctor tells you to stop. You may need blood work done while you are taking this medicine. Do not become pregnant while taking this medicine or for at least 6 months after stopping it. Women should inform their doctor if they wish to become pregnant or think they might be pregnant. There is a potential for serious side effects to an unborn child. Men should not father a child while taking this medicine and for at least 3 months after stopping it. Talk to your health care professional or pharmacist for more information. Do not breast-feed an infant while taking this medicine or for 2 weeks after the last dose. Check with your doctor or health care professional if you get an attack of severe diarrhea, nausea and vomiting, or if you sweat a lot. The loss of too much body fluid can make it dangerous for you to take this medicine. You may get dizzy. Do not drive, use machinery, or do anything that needs mental alertness until you know how this medicine affects you. Do not stand or sit up quickly, especially if you are an older patient. This reduces the risk of dizzy or fainting spells. What side effects may I notice from receiving this medicine? Side effects that you should report to your doctor or health care professional as soon as possible:  allergic reactions like skin   rash, itching or hives, swelling of the face, lips, or tongue  confusion  dizziness  feeling faint or lightheaded  fever or chills  palpitations  seizures  signs and symptoms of bleeding such as bloody or black, tarry stools; red or dark-brown urine; spitting up blood or brown material that looks like coffee grounds; red spots on the skin; unusual bruising or bleeding  including from the eye, gums, or nose  signs and symptoms of a blood clot such as breathing problems; changes in vision; chest pain; severe, sudden headache; pain, swelling, warmth in the leg; trouble speaking; sudden numbness or weakness of the face, arm or leg  signs and symptoms of kidney injury like trouble passing urine or change in the amount of urine  signs and symptoms of liver injury like dark yellow or brown urine; general ill feeling or flu-like symptoms; light-colored stools; loss of appetite; nausea; right upper belly pain; unusually weak or tired; yellowing of the eyes or skin Side effects that usually do not require medical attention (report to your doctor or health care professional if they continue or are bothersome):  back pain  cough  diarrhea  headache  muscle cramps  trouble sleeping  vomiting This list may not describe all possible side effects. Call your doctor for medical advice about side effects. You may report side effects to FDA at 1-800-FDA-1088. Where should I keep my medicine? This drug is given in a hospital or clinic and will not be stored at home. NOTE: This sheet is a summary. It may not cover all possible information. If you have questions about this medicine, talk to your doctor, pharmacist, or health care provider.  2021 Elsevier/Gold Standard (2019-03-15 19:44:21)  

## 2021-01-10 ENCOUNTER — Inpatient Hospital Stay: Payer: PPO

## 2021-01-10 ENCOUNTER — Inpatient Hospital Stay (HOSPITAL_BASED_OUTPATIENT_CLINIC_OR_DEPARTMENT_OTHER): Payer: PPO | Admitting: Hematology & Oncology

## 2021-01-10 ENCOUNTER — Telehealth: Payer: Self-pay

## 2021-01-10 ENCOUNTER — Other Ambulatory Visit: Payer: Self-pay

## 2021-01-10 VITALS — BP 141/63 | HR 81 | Temp 98.3°F | Resp 20 | Wt 106.0 lb

## 2021-01-10 DIAGNOSIS — Z5112 Encounter for antineoplastic immunotherapy: Secondary | ICD-10-CM | POA: Diagnosis not present

## 2021-01-10 DIAGNOSIS — C9000 Multiple myeloma not having achieved remission: Secondary | ICD-10-CM | POA: Diagnosis not present

## 2021-01-10 LAB — CBC WITH DIFFERENTIAL (CANCER CENTER ONLY)
Abs Immature Granulocytes: 0.01 10*3/uL (ref 0.00–0.07)
Basophils Absolute: 0 10*3/uL (ref 0.0–0.1)
Basophils Relative: 1 %
Eosinophils Absolute: 0.1 10*3/uL (ref 0.0–0.5)
Eosinophils Relative: 2 %
HCT: 41.1 % (ref 36.0–46.0)
Hemoglobin: 13.8 g/dL (ref 12.0–15.0)
Immature Granulocytes: 0 %
Lymphocytes Relative: 11 %
Lymphs Abs: 0.7 10*3/uL (ref 0.7–4.0)
MCH: 35.6 pg — ABNORMAL HIGH (ref 26.0–34.0)
MCHC: 33.6 g/dL (ref 30.0–36.0)
MCV: 105.9 fL — ABNORMAL HIGH (ref 80.0–100.0)
Monocytes Absolute: 0.5 10*3/uL (ref 0.1–1.0)
Monocytes Relative: 9 %
Neutro Abs: 4.9 10*3/uL (ref 1.7–7.7)
Neutrophils Relative %: 77 %
Platelet Count: 227 10*3/uL (ref 150–400)
RBC: 3.88 MIL/uL (ref 3.87–5.11)
RDW: 12 % (ref 11.5–15.5)
WBC Count: 6.2 10*3/uL (ref 4.0–10.5)
nRBC: 0 % (ref 0.0–0.2)

## 2021-01-10 LAB — CMP (CANCER CENTER ONLY)
ALT: 13 U/L (ref 0–44)
AST: 15 U/L (ref 15–41)
Albumin: 4.1 g/dL (ref 3.5–5.0)
Alkaline Phosphatase: 56 U/L (ref 38–126)
Anion gap: 8 (ref 5–15)
BUN: 13 mg/dL (ref 8–23)
CO2: 26 mmol/L (ref 22–32)
Calcium: 9.2 mg/dL (ref 8.9–10.3)
Chloride: 109 mmol/L (ref 98–111)
Creatinine: 0.71 mg/dL (ref 0.44–1.00)
GFR, Estimated: >60 mL/min (ref >60)
Glucose, Bld: 101 mg/dL — ABNORMAL HIGH (ref 70–99)
Potassium: 4.2 mmol/L (ref 3.5–5.1)
Sodium: 143 mmol/L (ref 135–145)
Total Bilirubin: 0.5 mg/dL (ref 0.3–1.2)
Total Protein: 5.8 g/dL — ABNORMAL LOW (ref 6.5–8.1)

## 2021-01-10 LAB — LACTATE DEHYDROGENASE: LDH: 209 U/L — ABNORMAL HIGH (ref 98–192)

## 2021-01-10 MED ORDER — ACETAMINOPHEN 325 MG PO TABS: 650.0000 mg | ORAL_TABLET | Freq: Once | ORAL | Status: DC

## 2021-01-10 MED ORDER — DIPHENHYDRAMINE HCL 25 MG PO CAPS: 25.0000 mg | ORAL_CAPSULE | Freq: Once | ORAL | Status: DC

## 2021-01-10 MED ORDER — DEXAMETHASONE 4 MG PO TABS: 8.0000 mg | ORAL_TABLET | Freq: Once | ORAL | Status: DC

## 2021-01-10 MED ORDER — SODIUM CHLORIDE 0.9 % IV SOLN
Freq: Once | INTRAVENOUS | Status: DC
  Filled 2021-01-10: qty 250

## 2021-01-10 MED ORDER — SODIUM CHLORIDE 0.9 % IV SOLN
Freq: Once | INTRAVENOUS | Status: AC
  Filled 2021-01-10: qty 250

## 2021-01-10 MED ORDER — DEXTROSE 5 % IV SOLN
70.0000 mg/m2 | Freq: Once | INTRAVENOUS | Status: AC
  Administered 2021-01-10: 100 mg via INTRAVENOUS
  Filled 2021-01-10: qty 15

## 2021-01-10 MED ORDER — DARATUMUMAB-HYALURONIDASE-FIHJ 1800-30000 MG-UT/15ML ~~LOC~~ SOLN
1800.0000 mg | Freq: Once | SUBCUTANEOUS | Status: AC
  Administered 2021-01-10: 1800 mg via SUBCUTANEOUS
  Filled 2021-01-10: qty 15

## 2021-01-10 MED ORDER — SODIUM CHLORIDE 0.9 % IV SOLN
40.0000 mg | Freq: Once | INTRAVENOUS | Status: AC
  Administered 2021-01-10: 40 mg via INTRAVENOUS
  Filled 2021-01-10: qty 4

## 2021-01-10 NOTE — Telephone Encounter (Signed)
Appts made per 01/10/21 los and pt to gain sch in tx/avs and through [employer]

## 2021-01-10 NOTE — Progress Notes (Signed)
Hematology and Oncology Follow Up Visit  FATOU DUNNIGAN [id number] 11-21-1945 75 y.o. 01/10/2021   Principle Diagnosis:  IgG Kappa myeloma - compression fractures - 13q-, +11, 14q+ - hyperdiploid -- progressive Iron def anemia  Completed Therapy: S/p Kyphoplasty of T11 and T12 XRT to lower spine-completed on 09/25/2016  Current Therapy:    Faspro -- start on 04/05/2020 -- s/p cycle #6 -- Kyprolis added on 07/20/2020 -- s/p cycle #10 Ninlaro 2.3 mg po q week (3 on/1 off) -- start on 01/24/2020 -- d/c on 03/29/2020 due to toxicity Velcade/Pomalyst/Decadron -Start on 09/02/2018 --Pomalyst on hold since 11/09/2018.  D/C on 01/17/2020 Xgeva $RemoveBeforeDE'120mg'[id number]$  Sq q  3 month - next dose in 02/2021  IV Injectafer -- last dose given on 11/2018   Interim History:  Ms. Lafortune is here today for follow-up.  She should be feeling a bit better.  She enjoys only having to do the Kyprolis every other week.  Were going to have to see if we might be able to move that back to once a month, along with the Faspro.  She is having problems with her back.  She is to see Dr. Lynann Bologna.  I am sure that he will be able to help her out.  She is eating pretty well.  She is having no problems with nausea or vomiting.  She has had no issues with fever.  She has had no cough or shortness of breath.  She has had no change in bowel or bladder habits.  Her last myeloma studies back in May showed a M spike of 0.2 g/dL.  Her IgG level was 345 mg/dL.  The Kappa light chain was 1.1 mg/dL.  Currently, her performance status is ECOG 1.      Medications:  Allergies as of 01/10/2021       Reactions   Crestor [rosuvastatin Calcium]    Causes liver functions to be elevated        Medication List        Accurate as of January 10, 2021 12:43 PM. If you have any questions, ask your nurse or doctor.          STOP taking these medications    OVER THE COUNTER MEDICATION Stopped by: [name], MD       TAKE these  medications    ALPRAZolam 0.25 MG tablet Commonly known as: XANAX Take 1 tablet (0.25 mg total) by mouth 2 (two) times daily as needed. for anxiety   amLODipine 5 MG tablet Commonly known as: NORVASC TAKE 1 TABLET(5 MG) BY MOUTH DAILY   aspirin 81 MG EC tablet Take 81 mg by mouth every other day.   atorvastatin 20 MG tablet Commonly known as: LIPITOR TAKE 1 TABLET(20 MG) BY MOUTH DAILY   buPROPion 300 MG 24 hr tablet Commonly known as: Wellbutrin XL Take 1 tablet (300 mg total) by mouth daily.   dexamethasone 4 MG tablet Commonly known as: DECADRON TAKE 3 TABLETS BY MOUTH 1 TIME A WEEK   escitalopram 20 MG tablet Commonly known as: LEXAPRO TAKE 1 TABLET(20 MG) BY MOUTH DAILY   famciclovir 250 MG tablet Commonly known as: FAMVIR TAKE 1 TABLET(250 MG) BY MOUTH DAILY   IRON PO Take by mouth daily.   LORazepam 0.5 MG tablet Commonly known as: Ativan Take 1 tablet (0.5 mg total) by mouth every 6 (six) hours as needed (Nausea or vomiting).   nebivolol 5 MG tablet Commonly known as: Bystolic TAKE 1 TABLET(5 MG) BY  MOUTH DAILY   ondansetron 8 MG tablet Commonly known as: Zofran Take 1 tablet (8 mg total) by mouth 2 (two) times daily as needed (Nausea or vomiting).   potassium chloride SA 20 MEQ tablet Commonly known as: KLOR-CON TAKE 1 TABLET(20 MEQ) BY MOUTH DAILY   prochlorperazine 10 MG tablet Commonly known as: COMPAZINE TAKE 1 TABLET(10 MG) BY MOUTH EVERY 6 HOURS AS NEEDED FOR NAUSEA OR VOMITING   REFRESH DRY EYE THERAPY OP Apply to eye 3 (three) times daily.   temazepam 7.5 MG capsule Commonly known as: RESTORIL TAKE 1 CAPSULE BY MOUTH AT BEDTIME AS NEEDED FOR SLEEP   Vitamin B-12 5000 MCG Tbdp Take by mouth daily.   Vitamin D 50 MCG (2000 UT) Caps Take 2,000 Units by mouth daily.        Allergies:  Allergies  Allergen Reactions   Crestor [Rosuvastatin Calcium]     Causes liver functions to be elevated    Past Medical History, Surgical  history, Social history, and Family History were reviewed and updated.  Review of Systems: Review of Systems  Constitutional: Negative.   HENT: Negative.    Eyes: Negative.   Respiratory: Negative.    Cardiovascular: Negative.   Gastrointestinal: Negative.   Genitourinary: Negative.   Musculoskeletal:  Positive for back pain.  Skin: Negative.   Neurological: Negative.   Endo/Heme/Allergies: Negative.   Psychiatric/Behavioral: Negative.     Physical Exam:  weight is 106 lb (48.1 kg). Her oral temperature is 98.3 F (36.8 C). Her blood pressure is 141/63 (abnormal) and her pulse is 81. Her respiration is 20 and oxygen saturation is 99%.   Wt Readings from Last 3 Encounters:  01/10/21 106 lb (48.1 kg)  12/14/20 108 lb (49 kg)  11/23/20 108 lb 1.9 oz (49 kg)    Physical Exam Vitals reviewed.  HENT:     Head: Normocephalic and atraumatic.  Eyes:     Pupils: Pupils are equal, round, and reactive to light.  Cardiovascular:     Rate and Rhythm: Normal rate and regular rhythm.     Heart sounds: Normal heart sounds.  Pulmonary:     Effort: Pulmonary effort is normal.     Breath sounds: Normal breath sounds.  Abdominal:     General: Bowel sounds are normal.     Palpations: Abdomen is soft.  Musculoskeletal:        General: No tenderness or deformity. Normal range of motion.     Cervical back: Normal range of motion.  Lymphadenopathy:     Cervical: No cervical adenopathy.  Skin:    General: Skin is warm and dry.     Findings: No erythema or rash.  Neurological:     Mental Status: She is alert and oriented to person, place, and time.  Psychiatric:        Behavior: Behavior normal.        Thought Content: Thought content normal.        Judgment: Judgment normal.      Lab Results  Component Value Date   WBC 6.2 01/10/2021   HGB 13.8 01/10/2021   HCT 41.1 01/10/2021   MCV 105.9 (H) 01/10/2021   PLT 227 01/10/2021   Lab Results  Component Value Date   FERRITIN 258  10/25/2020   IRON 136 10/25/2020   TIBC 353 10/25/2020   UIBC 216 10/25/2020   IRONPCTSAT 39 10/25/2020   Lab Results  Component Value Date   RBC 3.88 01/10/2021   Lab Results  Component Value Date   KPAFRELGTCHN 10.5 12/14/2020   LAMBDASER 1.8 (L) 12/14/2020   KAPLAMBRATIO 5.83 (H) 12/14/2020   Lab Results  Component Value Date   IGGSERUM 345 (L) 12/14/2020   IGA <5 (L) 12/14/2020   IGMSERUM 23 (L) 12/14/2020   Lab Results  Component Value Date   TOTALPROTELP 5.7 (L) 12/14/2020   ALBUMINELP 3.5 12/14/2020   A1GS 0.2 12/14/2020   A2GS 0.8 12/14/2020   BETS 0.9 12/14/2020   GAMS 0.3 (L) 12/14/2020   MSPIKE 0.2 (H) 12/14/2020   SPEI Comment 01/28/2018     Chemistry      Component Value Date/Time   NA 143 01/10/2021 0856   NA 148 (H) 07/16/2017 1128   NA 143 03/05/2017 1051   K 4.2 01/10/2021 0856   K 3.8 07/16/2017 1128   K 3.7 03/05/2017 1051   CL 109 01/10/2021 0856   CL 106 07/16/2017 1128   CO2 26 01/10/2021 0856   CO2 29 07/16/2017 1128   CO2 25 03/05/2017 1051   BUN 13 01/10/2021 0856   BUN 23 (H) 07/16/2017 1128   BUN 18.2 03/05/2017 1051   CREATININE 0.71 01/10/2021 0856   CREATININE 0.75 06/20/2020 1127   CREATININE 0.8 03/05/2017 1051      Component Value Date/Time   CALCIUM 9.2 01/10/2021 0856   CALCIUM 9.7 07/16/2017 1128   CALCIUM 8.9 03/05/2017 1051   ALKPHOS 56 01/10/2021 0856   ALKPHOS 71 07/16/2017 1128   ALKPHOS 67 03/05/2017 1051   AST 15 01/10/2021 0856   AST 15 03/05/2017 1051   ALT 13 01/10/2021 0856   ALT 23 07/16/2017 1128   ALT 17 03/05/2017 1051   BILITOT 0.5 01/10/2021 0856   BILITOT 0.55 03/05/2017 1051        Impression and Plan: Ms. Fayson is a very pleasant 75 yo caucasian female with IgG Kappa Myeloma.    We clearly have seen a response with Faspro/Kyprolis combination.  We are now using monthly Faspro.  Hopefully this will help continue to improve her quality of life.  We will have to see what her myeloma studies  look like.  Erythema looks pretty good and stable, we might build to do the [employer].   As always, we will plan to see her back monthly.     [name], MD 6/22/202212:43 PM

## 2021-01-10 NOTE — Patient Instructions (Signed)
[facility]  Discharge Instructions: Thank you for choosing [facility] to provide your oncology and hematology care.   If you have a lab appointment with the [facility], please go directly to the [facility] and check in at the registration area.  Wear comfortable clothing and clothing appropriate for easy access to any Portacath or PICC line.   We strive to give you quality time with your provider. You may need to reschedule your appointment if you arrive late (15 or more minutes).  Arriving late affects you and other patients whose appointments are after yours.  Also, if you miss three or more appointments without notifying the office, you may be dismissed from the clinic at the provider's discretion.      For prescription refill requests, have your pharmacy contact our office and allow 72 hours for refills to be completed.    Today you received the following chemotherapy and/or immunotherapy agents: Kyprolis, Darzalex.     To help prevent nausea and vomiting after your treatment, we encourage you to take your nausea medication as directed.  BELOW ARE SYMPTOMS THAT SHOULD BE REPORTED IMMEDIATELY: *FEVER GREATER THAN 100.4 F (38 C) OR HIGHER *CHILLS OR SWEATING *NAUSEA AND VOMITING THAT IS NOT CONTROLLED WITH YOUR NAUSEA MEDICATION *UNUSUAL SHORTNESS OF BREATH *UNUSUAL BRUISING OR BLEEDING *URINARY PROBLEMS (pain or burning when urinating, or frequent urination) *BOWEL PROBLEMS (unusual diarrhea, constipation, pain near the anus) TENDERNESS IN MOUTH AND THROAT WITH OR WITHOUT PRESENCE OF ULCERS (sore throat, sores in mouth, or a toothache) UNUSUAL RASH, SWELLING OR PAIN  UNUSUAL VAGINAL DISCHARGE OR ITCHING   Items with * indicate a potential emergency and should be followed up as soon as possible or go to the Emergency Department if any problems should occur.  Please show the CHEMOTHERAPY ALERT CARD or IMMUNOTHERAPY ALERT CARD at  check-in to the Emergency Department and triage nurse. Should you have questions after your visit or need to cancel or reschedule your appointment, please contact [facility]  [phone] and follow the prompts.  Office hours are 8:00 a.m. to 4:30 p.m. Monday - Friday. Please note that voicemails left after 4:00 p.m. may not be returned until the following business day.  We are closed weekends and major holidays. You have access to a nurse at all times for urgent questions. Please call the main number to the clinic [phone] and follow the prompts.  For any non-urgent questions, you may also contact your provider using MyChart. We now offer e-Visits for anyone 53 and older to request care online for non-urgent symptoms. For details visit [email or web address].   Also download the MyChart app! Go to the app store, search "MyChart", open the app, select [facility], and log in with your MyChart username and password.  Due to Covid, a mask is required upon entering the hospital/clinic. If you do not have a mask, one will be given to you upon arrival. For doctor visits, patients may have 1 support person aged 56 or older with them. For treatment visits, patients cannot have anyone with them due to current Covid guidelines and our immunocompromised population.

## 2021-01-11 LAB — KAPPA/LAMBDA LIGHT CHAINS
Kappa free light chain: 12 mg/L (ref 3.3–19.4)
Kappa, lambda light chain ratio: >8 — ABNORMAL HIGH (ref 0.26–1.65)
Lambda free light chains: <1.5 mg/L — ABNORMAL LOW (ref 5.7–26.3)

## 2021-01-11 LAB — IGG, IGA, IGM
IgA: <5 mg/dL — ABNORMAL LOW (ref 64–422)
IgG (Immunoglobin G), Serum: 329 mg/dL — ABNORMAL LOW (ref 586–1602)
IgM (Immunoglobulin M), Srm: 9 mg/dL — ABNORMAL LOW (ref 26–217)

## 2021-01-12 ENCOUNTER — Other Ambulatory Visit: Payer: Self-pay | Admitting: Internal Medicine

## 2021-01-15 LAB — PROTEIN ELECTROPHORESIS, SERUM, WITH REFLEX
A/G Ratio: 1.9 — ABNORMAL HIGH (ref 0.7–1.7)
Albumin ELP: 3.7 g/dL (ref 2.9–4.4)
Alpha-1-Globulin: 0.2 g/dL (ref 0.0–0.4)
Alpha-2-Globulin: 0.7 g/dL (ref 0.4–1.0)
Beta Globulin: 0.8 g/dL (ref 0.7–1.3)
Gamma Globulin: 0.3 g/dL — ABNORMAL LOW (ref 0.4–1.8)
Globulin, Total: 2 g/dL — ABNORMAL LOW (ref 2.2–3.9)
M-Spike, %: 0.2 g/dL — ABNORMAL HIGH
SPEP Interpretation: 0
Total Protein ELP: 5.7 g/dL — ABNORMAL LOW (ref 6.0–8.5)

## 2021-01-15 LAB — IMMUNOFIXATION REFLEX, SERUM
IgA: <5 mg/dL — ABNORMAL LOW (ref 64–422)
IgG (Immunoglobin G), Serum: 368 mg/dL — ABNORMAL LOW (ref 586–1602)
IgM (Immunoglobulin M), Srm: 11 mg/dL — ABNORMAL LOW (ref 26–217)

## 2021-01-24 ENCOUNTER — Inpatient Hospital Stay: Payer: PPO | Attending: Hematology & Oncology

## 2021-01-24 ENCOUNTER — Inpatient Hospital Stay: Payer: PPO

## 2021-01-24 ENCOUNTER — Other Ambulatory Visit: Payer: Self-pay

## 2021-01-24 VITALS — BP 112/51 | HR 82 | Temp 97.8°F | Resp 17

## 2021-01-24 DIAGNOSIS — D509 Iron deficiency anemia, unspecified: Secondary | ICD-10-CM | POA: Diagnosis not present

## 2021-01-24 DIAGNOSIS — C9000 Multiple myeloma not having achieved remission: Secondary | ICD-10-CM | POA: Diagnosis not present

## 2021-01-24 DIAGNOSIS — Z5112 Encounter for antineoplastic immunotherapy: Secondary | ICD-10-CM | POA: Diagnosis not present

## 2021-01-24 DIAGNOSIS — Z79899 Other long term (current) drug therapy: Secondary | ICD-10-CM | POA: Diagnosis not present

## 2021-01-24 LAB — CBC WITH DIFFERENTIAL (CANCER CENTER ONLY)
Abs Immature Granulocytes: 0.04 10*3/uL (ref 0.00–0.07)
Basophils Absolute: 0 10*3/uL (ref 0.0–0.1)
Basophils Relative: 0 %
Eosinophils Absolute: 0.1 10*3/uL (ref 0.0–0.5)
Eosinophils Relative: 1 %
HCT: 39.2 % (ref 36.0–46.0)
Hemoglobin: 12.9 g/dL (ref 12.0–15.0)
Immature Granulocytes: 1 %
Lymphocytes Relative: 9 %
Lymphs Abs: 0.7 10*3/uL (ref 0.7–4.0)
MCH: 35.3 pg — ABNORMAL HIGH (ref 26.0–34.0)
MCHC: 32.9 g/dL (ref 30.0–36.0)
MCV: 107.4 fL — ABNORMAL HIGH (ref 80.0–100.0)
Monocytes Absolute: 0.8 10*3/uL (ref 0.1–1.0)
Monocytes Relative: 10 %
Neutro Abs: 6.2 10*3/uL (ref 1.7–7.7)
Neutrophils Relative %: 79 %
Platelet Count: 258 10*3/uL (ref 150–400)
RBC: 3.65 MIL/uL — ABNORMAL LOW (ref 3.87–5.11)
RDW: 12.4 % (ref 11.5–15.5)
WBC Count: 7.8 10*3/uL (ref 4.0–10.5)
nRBC: 0 % (ref 0.0–0.2)

## 2021-01-24 LAB — COMPREHENSIVE METABOLIC PANEL
ALT: 16 U/L (ref 0–44)
AST: 15 U/L (ref 15–41)
Albumin: 4.2 g/dL (ref 3.5–5.0)
Alkaline Phosphatase: 58 U/L (ref 38–126)
Anion gap: 9 (ref 5–15)
BUN: 23 mg/dL (ref 8–23)
CO2: 25 mmol/L (ref 22–32)
Calcium: 9.2 mg/dL (ref 8.9–10.3)
Chloride: 108 mmol/L (ref 98–111)
Creatinine, Ser: 0.74 mg/dL (ref 0.44–1.00)
GFR, Estimated: >60 mL/min (ref >60)
Glucose, Bld: 89 mg/dL (ref 70–99)
Potassium: 3.9 mmol/L (ref 3.5–5.1)
Sodium: 142 mmol/L (ref 135–145)
Total Bilirubin: 0.4 mg/dL (ref 0.3–1.2)
Total Protein: 5.7 g/dL — ABNORMAL LOW (ref 6.5–8.1)

## 2021-01-24 MED ORDER — SODIUM CHLORIDE 0.9 % IV SOLN
40.0000 mg | Freq: Once | INTRAVENOUS | Status: AC
  Administered 2021-01-24: 40 mg via INTRAVENOUS
  Filled 2021-01-24: qty 4

## 2021-01-24 MED ORDER — SODIUM CHLORIDE 0.9 % IV SOLN
Freq: Once | INTRAVENOUS | Status: DC
  Filled 2021-01-24: qty 250

## 2021-01-24 MED ORDER — DEXTROSE 5 % IV SOLN
70.0000 mg/m2 | Freq: Once | INTRAVENOUS | Status: AC
  Administered 2021-01-24: 100 mg via INTRAVENOUS
  Filled 2021-01-24: qty 30

## 2021-01-24 MED ORDER — SODIUM CHLORIDE 0.9 % IV SOLN
Freq: Once | INTRAVENOUS | Status: AC
Start: 2021-01-24 — End: 2021-01-24
  Filled 2021-01-24: qty 250

## 2021-01-24 NOTE — Patient Instructions (Signed)
[facility]  Discharge Instructions: Thank you for choosing [facility] to provide your oncology and hematology care.   If you have a lab appointment with the [facility], please go directly to the [facility] and check in at the registration area.  Wear comfortable clothing and clothing appropriate for easy access to any Portacath or PICC line.   We strive to give you quality time with your provider. You may need to reschedule your appointment if you arrive late (15 or more minutes).  Arriving late affects you and other patients whose appointments are after yours.  Also, if you miss three or more appointments without notifying the office, you may be dismissed from the clinic at the provider's discretion.      For prescription refill requests, have your pharmacy contact our office and allow 72 hours for refills to be completed.    Today you received the following chemotherapy and/or immunotherapy agents Kyprolis   To help prevent nausea and vomiting after your treatment, we encourage you to take your nausea medication as directed.  BELOW ARE SYMPTOMS THAT SHOULD BE REPORTED IMMEDIATELY: *FEVER GREATER THAN 100.4 F (38 C) OR HIGHER *CHILLS OR SWEATING *NAUSEA AND VOMITING THAT IS NOT CONTROLLED WITH YOUR NAUSEA MEDICATION *UNUSUAL SHORTNESS OF BREATH *UNUSUAL BRUISING OR BLEEDING *URINARY PROBLEMS (pain or burning when urinating, or frequent urination) *BOWEL PROBLEMS (unusual diarrhea, constipation, pain near the anus) TENDERNESS IN MOUTH AND THROAT WITH OR WITHOUT PRESENCE OF ULCERS (sore throat, sores in mouth, or a toothache) UNUSUAL RASH, SWELLING OR PAIN  UNUSUAL VAGINAL DISCHARGE OR ITCHING   Items with * indicate a potential emergency and should be followed up as soon as possible or go to the Emergency Department if any problems should occur.  Please show the CHEMOTHERAPY ALERT CARD or IMMUNOTHERAPY ALERT CARD at check-in to the  Emergency Department and triage nurse. Should you have questions after your visit or need to cancel or reschedule your appointment, please contact [facility]  [phone] and follow the prompts.  Office hours are 8:00 a.m. to 4:30 p.m. Monday - Friday. Please note that voicemails left after 4:00 p.m. may not be returned until the following business day.  We are closed weekends and major holidays. You have access to a nurse at all times for urgent questions. Please call the main number to the clinic [phone] and follow the prompts.  For any non-urgent questions, you may also contact your provider using MyChart. We now offer e-Visits for anyone 101 and older to request care online for non-urgent symptoms. For details visit [email or web address].   Also download the MyChart app! Go to the app store, search "MyChart", open the app, select [facility], and log in with your MyChart username and password.  Due to Covid, a mask is required upon entering the hospital/clinic. If you do not have a mask, one will be given to you upon arrival. For doctor visits, patients may have 1 support person aged 73 or older with them. For treatment visits, patients cannot have anyone with them due to current Covid guidelines and our immunocompromised population.

## 2021-01-30 ENCOUNTER — Other Ambulatory Visit: Payer: Self-pay | Admitting: Hematology & Oncology

## 2021-01-31 ENCOUNTER — Encounter: Payer: Self-pay | Admitting: Internal Medicine

## 2021-01-31 DIAGNOSIS — D485 Neoplasm of uncertain behavior of skin: Secondary | ICD-10-CM | POA: Diagnosis not present

## 2021-01-31 DIAGNOSIS — M4722 Other spondylosis with radiculopathy, cervical region: Secondary | ICD-10-CM | POA: Diagnosis not present

## 2021-01-31 DIAGNOSIS — D1809 Hemangioma of other sites: Secondary | ICD-10-CM | POA: Diagnosis not present

## 2021-01-31 DIAGNOSIS — D225 Melanocytic nevi of trunk: Secondary | ICD-10-CM | POA: Diagnosis not present

## 2021-01-31 DIAGNOSIS — L821 Other seborrheic keratosis: Secondary | ICD-10-CM | POA: Diagnosis not present

## 2021-01-31 DIAGNOSIS — D692 Other nonthrombocytopenic purpura: Secondary | ICD-10-CM | POA: Diagnosis not present

## 2021-01-31 DIAGNOSIS — Z85828 Personal history of other malignant neoplasm of skin: Secondary | ICD-10-CM | POA: Diagnosis not present

## 2021-01-31 DIAGNOSIS — D1801 Hemangioma of skin and subcutaneous tissue: Secondary | ICD-10-CM | POA: Diagnosis not present

## 2021-01-31 DIAGNOSIS — L814 Other melanin hyperpigmentation: Secondary | ICD-10-CM | POA: Diagnosis not present

## 2021-02-05 ENCOUNTER — Inpatient Hospital Stay (HOSPITAL_BASED_OUTPATIENT_CLINIC_OR_DEPARTMENT_OTHER): Payer: PPO | Admitting: Hematology & Oncology

## 2021-02-05 ENCOUNTER — Other Ambulatory Visit: Payer: Self-pay

## 2021-02-05 ENCOUNTER — Inpatient Hospital Stay: Payer: PPO

## 2021-02-05 ENCOUNTER — Encounter: Payer: Self-pay | Admitting: Hematology & Oncology

## 2021-02-05 VITALS — BP 157/81 | HR 88 | Temp 97.5°F | Resp 20 | Wt 102.0 lb

## 2021-02-05 DIAGNOSIS — C9000 Multiple myeloma not having achieved remission: Secondary | ICD-10-CM

## 2021-02-05 DIAGNOSIS — Z5112 Encounter for antineoplastic immunotherapy: Secondary | ICD-10-CM | POA: Diagnosis not present

## 2021-02-05 LAB — CMP (CANCER CENTER ONLY)
ALT: 19 U/L (ref 0–44)
AST: 13 U/L — ABNORMAL LOW (ref 15–41)
Albumin: 4.3 g/dL (ref 3.5–5.0)
Alkaline Phosphatase: 64 U/L (ref 38–126)
Anion gap: 14 (ref 5–15)
BUN: 25 mg/dL — ABNORMAL HIGH (ref 8–23)
CO2: 22 mmol/L (ref 22–32)
Calcium: 9.1 mg/dL (ref 8.9–10.3)
Chloride: 102 mmol/L (ref 98–111)
Creatinine: 0.73 mg/dL (ref 0.44–1.00)
GFR, Estimated: >60 mL/min (ref >60)
Glucose, Bld: 158 mg/dL — ABNORMAL HIGH (ref 70–99)
Potassium: 3.9 mmol/L (ref 3.5–5.1)
Sodium: 138 mmol/L (ref 135–145)
Total Bilirubin: 0.5 mg/dL (ref 0.3–1.2)
Total Protein: 6.3 g/dL — ABNORMAL LOW (ref 6.5–8.1)

## 2021-02-05 LAB — CBC WITH DIFFERENTIAL (CANCER CENTER ONLY)
Abs Immature Granulocytes: 0.1 10*3/uL — ABNORMAL HIGH (ref 0.00–0.07)
Basophils Absolute: 0 10*3/uL (ref 0.0–0.1)
Basophils Relative: 0 %
Eosinophils Absolute: 0 10*3/uL (ref 0.0–0.5)
Eosinophils Relative: 0 %
HCT: 41.1 % (ref 36.0–46.0)
Hemoglobin: 14 g/dL (ref 12.0–15.0)
Immature Granulocytes: 1 %
Lymphocytes Relative: 10 %
Lymphs Abs: 1 10*3/uL (ref 0.7–4.0)
MCH: 35.1 pg — ABNORMAL HIGH (ref 26.0–34.0)
MCHC: 34.1 g/dL (ref 30.0–36.0)
MCV: 103 fL — ABNORMAL HIGH (ref 80.0–100.0)
Monocytes Absolute: 0.6 10*3/uL (ref 0.1–1.0)
Monocytes Relative: 6 %
Neutro Abs: 8.1 10*3/uL — ABNORMAL HIGH (ref 1.7–7.7)
Neutrophils Relative %: 83 %
Platelet Count: 402 10*3/uL — ABNORMAL HIGH (ref 150–400)
RBC: 3.99 MIL/uL (ref 3.87–5.11)
RDW: 12.1 % (ref 11.5–15.5)
WBC Count: 9.8 10*3/uL (ref 4.0–10.5)
nRBC: 0 % (ref 0.0–0.2)

## 2021-02-05 LAB — LACTATE DEHYDROGENASE: LDH: 200 U/L — ABNORMAL HIGH (ref 98–192)

## 2021-02-05 MED ORDER — DIPHENHYDRAMINE HCL 25 MG PO CAPS
ORAL_CAPSULE | ORAL | Status: AC
  Filled 2021-02-05: qty 1

## 2021-02-05 MED ORDER — SODIUM CHLORIDE 0.9 % IV SOLN
40.0000 mg | Freq: Once | INTRAVENOUS | Status: AC
  Administered 2021-02-05: 40 mg via INTRAVENOUS
  Filled 2021-02-05: qty 4

## 2021-02-05 MED ORDER — DEXTROSE 5 % IV SOLN
70.0000 mg/m2 | Freq: Once | INTRAVENOUS | Status: AC
  Administered 2021-02-05: 100 mg via INTRAVENOUS
  Filled 2021-02-05: qty 30

## 2021-02-05 MED ORDER — DIPHENHYDRAMINE HCL 25 MG PO CAPS: 25.0000 mg | ORAL_CAPSULE | Freq: Once | ORAL | Status: DC

## 2021-02-05 MED ORDER — SODIUM CHLORIDE 0.9 % IV SOLN
Freq: Once | INTRAVENOUS | Status: AC
  Filled 2021-02-05: qty 250

## 2021-02-05 MED ORDER — ACETAMINOPHEN 325 MG PO TABS
ORAL_TABLET | ORAL | Status: AC
  Filled 2021-02-05: qty 2

## 2021-02-05 MED ORDER — DARATUMUMAB-HYALURONIDASE-FIHJ 1800-30000 MG-UT/15ML ~~LOC~~ SOLN
1800.0000 mg | Freq: Once | SUBCUTANEOUS | Status: AC
  Administered 2021-02-05: 1800 mg via SUBCUTANEOUS
  Filled 2021-02-05: qty 15

## 2021-02-05 MED ORDER — ACETAMINOPHEN 325 MG PO TABS: 650.0000 mg | ORAL_TABLET | Freq: Once | ORAL | Status: DC

## 2021-02-05 MED ORDER — DEXAMETHASONE 4 MG PO TABS: 8.0000 mg | ORAL_TABLET | Freq: Once | ORAL | Status: DC

## 2021-02-05 NOTE — Patient Instructions (Signed)
[facility] Discharge Instructions for Patients Receiving Chemotherapy  Today you received the following chemotherapy agents Kyprolis and Darzalex Faspro  To help prevent nausea and vomiting after your treatment, we encourage you to take your nausea medication as prescribed by MD.   If you develop nausea and vomiting that is not controlled by your nausea medication, call the clinic.   BELOW ARE SYMPTOMS THAT SHOULD BE REPORTED IMMEDIATELY: *FEVER GREATER THAN 100.5 F *CHILLS WITH OR WITHOUT FEVER NAUSEA AND VOMITING THAT IS NOT CONTROLLED WITH YOUR NAUSEA MEDICATION *UNUSUAL SHORTNESS OF BREATH *UNUSUAL BRUISING OR BLEEDING TENDERNESS IN MOUTH AND THROAT WITH OR WITHOUT PRESENCE OF ULCERS *URINARY PROBLEMS *BOWEL PROBLEMS UNUSUAL RASH Items with * indicate a potential emergency and should be followed up as soon as possible.  Feel free to call the clinic should you have any questions or concerns. The clinic phone number is [phone].  Please show the [facility] at check-in to the Emergency Department and triage nurse.

## 2021-02-05 NOTE — Progress Notes (Addendum)
Hematology and Oncology Follow Up Visit  Debra Ruiz [id number] 1946/01/29 75 y.o. 02/05/2021   Principle Diagnosis:  IgG Kappa myeloma - compression fractures - 13q-, +11, 14q+ - hyperdiploid -- progressive Iron def anemia  Completed Therapy: S/p Kyphoplasty of T11 and T12 XRT to lower spine-completed on 09/25/2016  Current Therapy:    Faspro -- start on 04/05/2020 -- s/p cycle #6 -- Kyprolis added on 07/20/2020 -- s/p cycle #11 Ninlaro 2.3 mg po q week (3 on/1 off) -- start on 01/24/2020 -- d/c on 03/29/2020 due to toxicity Velcade/Pomalyst/Decadron -Start on 09/02/2018 --Pomalyst on hold since 11/09/2018.  D/C on 01/17/2020 Xgeva 161m Sq q  3 month - next dose in 02/2021  IV Injectafer -- last dose given on 11/2018   Interim History:  Debra Ruiz here today for follow-up.  She is doing okay.  She [place] her [place]  They had a nice time.  Her back is bothering her a little bit.  She did see Dr. DLynann Bolognaand his associates.  I think there is still trying to work on this for her.  Her myeloma has been holding relatively stable.  Back in June, her M spike was 0.2 g/dL.  The IgG level was 3 to 29 mg/dL.  The Kappa light chain was 1.2 mg/dL.  She has had no problems with bowels or bladder.  She has had no fever.  There is no issues with COVID.  She has had no rashes.  She has had no bleeding.  She has had no headache.  Her neck does bother her on occasion.  Overall, her performance status is ECOG 1.       Medications:  Allergies as of 02/05/2021       Reactions   Crestor [rosuvastatin Calcium]    Causes liver functions to be elevated        Medication List        Accurate as of February 05, 2021 10:29 AM. If you have any questions, ask your nurse or doctor.          ALPRAZolam 0.25 MG tablet Commonly known as: XANAX Take 1 tablet (0.25 mg total) by mouth 2 (two) times daily as needed. for anxiety   amLODipine 5 MG  tablet Commonly known as: NORVASC TAKE 1 TABLET(5 MG) BY MOUTH DAILY   aspirin 81 MG EC tablet Take 81 mg by mouth every other day.   atorvastatin 20 MG tablet Commonly known as: LIPITOR TAKE 1 TABLET(20 MG) BY MOUTH DAILY   buPROPion 300 MG 24 hr tablet Commonly known as: Wellbutrin XL Take 1 tablet (300 mg total) by mouth daily.   dexamethasone 4 MG tablet Commonly known as: DECADRON TAKE 3 TABLETS BY MOUTH 1 TIME A WEEK   escitalopram 20 MG tablet Commonly known as: LEXAPRO TAKE 1 TABLET(20 MG) BY MOUTH DAILY   famciclovir 250 MG tablet Commonly known as: FAMVIR TAKE 1 TABLET(250 MG) BY MOUTH DAILY   IRON PO Take by mouth daily.   LORazepam 0.5 MG tablet Commonly known as: Ativan Take 1 tablet (0.5 mg total) by mouth every 6 (six) hours as needed (Nausea or vomiting).   nebivolol 5 MG tablet Commonly known as: Bystolic TAKE 1 TABLET(5 MG) BY MOUTH DAILY   ondansetron 8 MG tablet Commonly known as: Zofran Take 1 tablet (8 mg total) by mouth 2 (two) times daily as needed (Nausea or vomiting).   potassium chloride SA 20 MEQ  tablet Commonly known as: KLOR-CON TAKE 1 TABLET(20 MEQ) BY MOUTH DAILY   prochlorperazine 10 MG tablet Commonly known as: COMPAZINE TAKE 1 TABLET(10 MG) BY MOUTH EVERY 6 HOURS AS NEEDED FOR NAUSEA OR VOMITING   REFRESH DRY EYE THERAPY OP Apply to eye 3 (three) times daily.   temazepam 7.5 MG capsule Commonly known as: RESTORIL TAKE 1 CAPSULE BY MOUTH AT BEDTIME AS NEEDED FOR SLEEP   Vitamin B-12 5000 MCG Tbdp Take by mouth daily.   Vitamin D 50 MCG (2000 UT) Caps Take 2,000 Units by mouth daily.        Allergies:  Allergies  Allergen Reactions   Crestor [Rosuvastatin Calcium]     Causes liver functions to be elevated    Past Medical History, Surgical history, Social history, and Family History were reviewed and updated.  Review of Systems: Review of Systems  Constitutional: Negative.   HENT: Negative.    Eyes:  Negative.   Respiratory: Negative.    Cardiovascular: Negative.   Gastrointestinal: Negative.   Genitourinary: Negative.   Musculoskeletal:  Positive for back pain.  Skin: Negative.   Neurological: Negative.   Endo/Heme/Allergies: Negative.   Psychiatric/Behavioral: Negative.     Physical Exam:  weight is 102 lb (46.3 kg). Her oral temperature is 97.5 F (36.4 C) (abnormal). Her blood pressure is 157/81 (abnormal) and her pulse is 88. Her respiration is 20 and oxygen saturation is 97%.   Wt Readings from Last 3 Encounters:  02/05/21 102 lb (46.3 kg)  01/10/21 106 lb (48.1 kg)  12/14/20 108 lb (49 kg)    Physical Exam Vitals reviewed.  HENT:     Head: Normocephalic and atraumatic.  Eyes:     Pupils: Pupils are equal, round, and reactive to light.  Cardiovascular:     Rate and Rhythm: Normal rate and regular rhythm.     Heart sounds: Normal heart sounds.  Pulmonary:     Effort: Pulmonary effort is normal.     Breath sounds: Normal breath sounds.  Abdominal:     General: Bowel sounds are normal.     Palpations: Abdomen is soft.  Musculoskeletal:        General: No tenderness or deformity. Normal range of motion.     Cervical back: Normal range of motion.  Lymphadenopathy:     Cervical: No cervical adenopathy.  Skin:    General: Skin is warm and dry.     Findings: No erythema or rash.  Neurological:     Mental Status: She is alert and oriented to person, place, and time.  Psychiatric:        Behavior: Behavior normal.        Thought Content: Thought content normal.        Judgment: Judgment normal.      Lab Results  Component Value Date   WBC 9.8 02/05/2021   HGB 14.0 02/05/2021   HCT 41.1 02/05/2021   MCV 103.0 (H) 02/05/2021   PLT 402 (H) 02/05/2021   Lab Results  Component Value Date   FERRITIN 258 10/25/2020   IRON 136 10/25/2020   TIBC 353 10/25/2020   UIBC 216 10/25/2020   IRONPCTSAT 39 10/25/2020   Lab Results  Component Value Date   RBC 3.99  02/05/2021   Lab Results  Component Value Date   KPAFRELGTCHN 12.0 01/10/2021   LAMBDASER <1.5 (L) 01/10/2021   KAPLAMBRATIO >8.00 (H) 01/10/2021   Lab Results  Component Value Date   IGGSERUM 329 (L) 01/10/2021  IGGSERUM 368 (L) 01/10/2021   IGA <5 (L) 01/10/2021   IGA <5 (L) 01/10/2021   IGMSERUM 9 (L) 01/10/2021   IGMSERUM 11 (L) 01/10/2021   Lab Results  Component Value Date   TOTALPROTELP 5.7 (L) 01/10/2021   ALBUMINELP 3.7 01/10/2021   A1GS 0.2 01/10/2021   A2GS 0.7 01/10/2021   BETS 0.8 01/10/2021   GAMS 0.3 (L) 01/10/2021   MSPIKE 0.2 (H) 01/10/2021   SPEI Comment 01/28/2018     Chemistry      Component Value Date/Time   NA 138 02/05/2021 0825   NA 148 (H) 07/16/2017 1128   NA 143 03/05/2017 1051   K 3.9 02/05/2021 0825   K 3.8 07/16/2017 1128   K 3.7 03/05/2017 1051   CL 102 02/05/2021 0825   CL 106 07/16/2017 1128   CO2 22 02/05/2021 0825   CO2 29 07/16/2017 1128   CO2 25 03/05/2017 1051   BUN 25 (H) 02/05/2021 0825   BUN 23 (H) 07/16/2017 1128   BUN 18.2 03/05/2017 1051   CREATININE 0.73 02/05/2021 0825   CREATININE 0.75 06/20/2020 1127   CREATININE 0.8 03/05/2017 1051      Component Value Date/Time   CALCIUM 9.1 02/05/2021 0825   CALCIUM 9.7 07/16/2017 1128   CALCIUM 8.9 03/05/2017 1051   ALKPHOS 64 02/05/2021 0825   ALKPHOS 71 07/16/2017 1128   ALKPHOS 67 03/05/2017 1051   AST 13 (L) 02/05/2021 0825   AST 15 03/05/2017 1051   ALT 19 02/05/2021 0825   ALT 23 07/16/2017 1128   ALT 17 03/05/2017 1051   BILITOT 0.5 02/05/2021 0825   BILITOT 0.55 03/05/2017 1051        Impression and Plan: Debra Ruiz is a very pleasant 75 yo caucasian female with IgG Kappa Myeloma.    We like to see what her myeloma numbers look like.  Hopefully, we will be able to spread out the Kyprolis a little bit more.  She is on Faslodex monthly.  I am glad that her quality life is doing okay right now.  We will plan to get her back to see [place] in another month.  We  will have to make adjustments to her schedule depending on the myeloma studies.      [name], MD 7/18/202210:29 AM   ADDENDUM: We will now adjust her chemotherapy because her labs look so good.  Her monoclonal spike is 0.1 g/dL.  Her kappa light chain is 0.5 mg/dL.  I will try to coordinate where she does has 1 treatment a month.

## 2021-02-06 ENCOUNTER — Telehealth: Payer: Self-pay

## 2021-02-06 LAB — KAPPA/LAMBDA LIGHT CHAINS
Kappa free light chain: 4.7 mg/L (ref 3.3–19.4)
Kappa, lambda light chain ratio: >3.13 — ABNORMAL HIGH (ref 0.26–1.65)
Lambda free light chains: <1.5 mg/L — ABNORMAL LOW (ref 5.7–26.3)

## 2021-02-06 LAB — IGG, IGA, IGM
IgA: <5 mg/dL — ABNORMAL LOW (ref 64–422)
IgG (Immunoglobin G), Serum: 355 mg/dL — ABNORMAL LOW (ref 586–1602)
IgM (Immunoglobulin M), Srm: 9 mg/dL — ABNORMAL LOW (ref 26–217)

## 2021-02-06 NOTE — Telephone Encounter (Signed)
No 02/05/21 los noted   [employer]

## 2021-02-08 ENCOUNTER — Other Ambulatory Visit: Payer: Self-pay | Admitting: Hematology & Oncology

## 2021-02-08 DIAGNOSIS — R5383 Other fatigue: Secondary | ICD-10-CM

## 2021-02-08 DIAGNOSIS — C9000 Multiple myeloma not having achieved remission: Secondary | ICD-10-CM

## 2021-02-08 LAB — IMMUNOFIXATION REFLEX, SERUM
IgA: <5 mg/dL — ABNORMAL LOW (ref 64–422)
IgG (Immunoglobin G), Serum: 377 mg/dL — ABNORMAL LOW (ref 586–1602)
IgM (Immunoglobulin M), Srm: 11 mg/dL — ABNORMAL LOW (ref 26–217)

## 2021-02-08 LAB — PROTEIN ELECTROPHORESIS, SERUM, WITH REFLEX
A/G Ratio: 1.7 (ref 0.7–1.7)
Albumin ELP: 3.8 g/dL (ref 2.9–4.4)
Alpha-1-Globulin: 0.3 g/dL (ref 0.0–0.4)
Alpha-2-Globulin: 0.8 g/dL (ref 0.4–1.0)
Beta Globulin: 0.9 g/dL (ref 0.7–1.3)
Gamma Globulin: 0.3 g/dL — ABNORMAL LOW (ref 0.4–1.8)
Globulin, Total: 2.3 g/dL (ref 2.2–3.9)
M-Spike, %: 0.1 g/dL — ABNORMAL HIGH
SPEP Interpretation: 0
Total Protein ELP: 6.1 g/dL (ref 6.0–8.5)

## 2021-02-12 ENCOUNTER — Telehealth: Payer: Self-pay

## 2021-02-12 ENCOUNTER — Encounter: Payer: Self-pay | Admitting: Hematology & Oncology

## 2021-02-12 NOTE — Addendum Note (Signed)
Addended by: [name] on: 02/12/2021 01:33 PM   Modules accepted: Orders

## 2021-02-23 DIAGNOSIS — M545 Low back pain, unspecified: Secondary | ICD-10-CM | POA: Diagnosis not present

## 2021-02-23 DIAGNOSIS — M5416 Radiculopathy, lumbar region: Secondary | ICD-10-CM | POA: Diagnosis not present

## 2021-02-23 DIAGNOSIS — M546 Pain in thoracic spine: Secondary | ICD-10-CM | POA: Diagnosis not present

## 2021-02-26 ENCOUNTER — Other Ambulatory Visit: Payer: Self-pay | Admitting: Orthopedic Surgery

## 2021-02-26 DIAGNOSIS — M546 Pain in thoracic spine: Secondary | ICD-10-CM

## 2021-02-26 DIAGNOSIS — M5416 Radiculopathy, lumbar region: Secondary | ICD-10-CM

## 2021-03-05 ENCOUNTER — Ambulatory Visit
Admission: RE | Admit: 2021-03-05 | Discharge: 2021-03-05 | Disposition: A | Discharge status: Home / Self Care | Payer: PPO | Source: Ambulatory Visit | Attending: Orthopedic Surgery | Admitting: Orthopedic Surgery

## 2021-03-05 DIAGNOSIS — M5416 Radiculopathy, lumbar region: Secondary | ICD-10-CM

## 2021-03-05 DIAGNOSIS — M48061 Spinal stenosis, lumbar region without neurogenic claudication: Secondary | ICD-10-CM | POA: Diagnosis not present

## 2021-03-05 DIAGNOSIS — S22060A Wedge compression fracture of T7-T8 vertebra, initial encounter for closed fracture: Secondary | ICD-10-CM | POA: Diagnosis not present

## 2021-03-05 DIAGNOSIS — M546 Pain in thoracic spine: Secondary | ICD-10-CM

## 2021-03-05 DIAGNOSIS — M545 Low back pain, unspecified: Secondary | ICD-10-CM | POA: Diagnosis not present

## 2021-03-06 ENCOUNTER — Inpatient Hospital Stay: Payer: PPO

## 2021-03-06 ENCOUNTER — Encounter: Payer: Self-pay | Admitting: Hematology & Oncology

## 2021-03-06 ENCOUNTER — Other Ambulatory Visit: Payer: Self-pay

## 2021-03-06 ENCOUNTER — Inpatient Hospital Stay: Payer: PPO | Attending: Hematology & Oncology

## 2021-03-06 ENCOUNTER — Inpatient Hospital Stay (HOSPITAL_BASED_OUTPATIENT_CLINIC_OR_DEPARTMENT_OTHER): Payer: PPO | Admitting: Hematology & Oncology

## 2021-03-06 VITALS — BP 146/77 | HR 93 | Temp 98.4°F | Resp 20 | Wt 104.0 lb

## 2021-03-06 DIAGNOSIS — C9000 Multiple myeloma not having achieved remission: Secondary | ICD-10-CM | POA: Diagnosis not present

## 2021-03-06 DIAGNOSIS — Z79899 Other long term (current) drug therapy: Secondary | ICD-10-CM | POA: Insufficient documentation

## 2021-03-06 DIAGNOSIS — D509 Iron deficiency anemia, unspecified: Secondary | ICD-10-CM | POA: Insufficient documentation

## 2021-03-06 DIAGNOSIS — Z5112 Encounter for antineoplastic immunotherapy: Secondary | ICD-10-CM | POA: Insufficient documentation

## 2021-03-06 LAB — CBC WITH DIFFERENTIAL (CANCER CENTER ONLY)
Abs Immature Granulocytes: 0.03 10*3/uL (ref 0.00–0.07)
Basophils Absolute: 0 10*3/uL (ref 0.0–0.1)
Basophils Relative: 0 %
Eosinophils Absolute: 0.1 10*3/uL (ref 0.0–0.5)
Eosinophils Relative: 1 %
HCT: 40.6 % (ref 36.0–46.0)
Hemoglobin: 13.7 g/dL (ref 12.0–15.0)
Immature Granulocytes: 0 %
Lymphocytes Relative: 14 %
Lymphs Abs: 1.1 10*3/uL (ref 0.7–4.0)
MCH: 36 pg — ABNORMAL HIGH (ref 26.0–34.0)
MCHC: 33.7 g/dL (ref 30.0–36.0)
MCV: 106.6 fL — ABNORMAL HIGH (ref 80.0–100.0)
Monocytes Absolute: 0.7 10*3/uL (ref 0.1–1.0)
Monocytes Relative: 9 %
Neutro Abs: 5.8 10*3/uL (ref 1.7–7.7)
Neutrophils Relative %: 76 %
Platelet Count: 229 10*3/uL (ref 150–400)
RBC: 3.81 MIL/uL — ABNORMAL LOW (ref 3.87–5.11)
RDW: 13 % (ref 11.5–15.5)
WBC Count: 7.8 10*3/uL (ref 4.0–10.5)
nRBC: 0 % (ref 0.0–0.2)

## 2021-03-06 LAB — CMP (CANCER CENTER ONLY)
ALT: 13 U/L (ref 0–44)
AST: 14 U/L — ABNORMAL LOW (ref 15–41)
Albumin: 4.1 g/dL (ref 3.5–5.0)
Alkaline Phosphatase: 63 U/L (ref 38–126)
Anion gap: 11 (ref 5–15)
BUN: 19 mg/dL (ref 8–23)
CO2: 22 mmol/L (ref 22–32)
Calcium: 9 mg/dL (ref 8.9–10.3)
Chloride: 108 mmol/L (ref 98–111)
Creatinine: 0.73 mg/dL (ref 0.44–1.00)
GFR, Estimated: >60 mL/min (ref >60)
Glucose, Bld: 112 mg/dL — ABNORMAL HIGH (ref 70–99)
Potassium: 4.1 mmol/L (ref 3.5–5.1)
Sodium: 141 mmol/L (ref 135–145)
Total Bilirubin: 0.7 mg/dL (ref 0.3–1.2)
Total Protein: 5.5 g/dL — ABNORMAL LOW (ref 6.5–8.1)

## 2021-03-06 LAB — LACTATE DEHYDROGENASE: LDH: 230 U/L — ABNORMAL HIGH (ref 98–192)

## 2021-03-06 MED ORDER — SODIUM CHLORIDE 0.9 % IV SOLN: Freq: Once | INTRAVENOUS | Status: AC

## 2021-03-06 MED ORDER — DIPHENHYDRAMINE HCL 25 MG PO CAPS: 25.0000 mg | ORAL_CAPSULE | Freq: Once | ORAL | Status: DC

## 2021-03-06 MED ORDER — DEXTROSE 5 % IV SOLN
70.0000 mg/m2 | Freq: Once | INTRAVENOUS | Status: AC
  Administered 2021-03-06: 100 mg via INTRAVENOUS
  Filled 2021-03-06: qty 5

## 2021-03-06 MED ORDER — ACETAMINOPHEN 325 MG PO TABS: 650.0000 mg | ORAL_TABLET | Freq: Once | ORAL | Status: DC

## 2021-03-06 MED ORDER — DEXAMETHASONE 4 MG PO TABS: 8.0000 mg | ORAL_TABLET | Freq: Once | ORAL | Status: DC

## 2021-03-06 MED ORDER — DARATUMUMAB-HYALURONIDASE-FIHJ 1800-30000 MG-UT/15ML ~~LOC~~ SOLN
1800.0000 mg | Freq: Once | SUBCUTANEOUS | Status: AC
  Administered 2021-03-06: 1800 mg via SUBCUTANEOUS
  Filled 2021-03-06: qty 15

## 2021-03-06 MED ORDER — DENOSUMAB 120 MG/1.7ML ~~LOC~~ SOLN
120.0000 mg | Freq: Once | SUBCUTANEOUS | Status: AC
  Administered 2021-03-06: 120 mg via SUBCUTANEOUS
  Filled 2021-03-06: qty 1.7

## 2021-03-06 MED ORDER — SODIUM CHLORIDE 0.9 % IV SOLN
40.0000 mg | Freq: Once | INTRAVENOUS | Status: AC
  Administered 2021-03-06: 40 mg via INTRAVENOUS
  Filled 2021-03-06: qty 4

## 2021-03-06 NOTE — Patient Instructions (Signed)
[facility]  Discharge Instructions: Thank you for choosing [facility] to provide your oncology and hematology care.   If you have a lab appointment with the [facility], please go directly to the [facility] and check in at the registration area.  Wear comfortable clothing and clothing appropriate for easy access to any Portacath or PICC line.   We strive to give you quality time with your provider. You may need to reschedule your appointment if you arrive late (15 or more minutes).  Arriving late affects you and other patients whose appointments are after yours.  Also, if you miss three or more appointments without notifying the office, you may be dismissed from the clinic at the provider's discretion.      For prescription refill requests, have your pharmacy contact our office and allow 72 hours for refills to be completed.    Today you received the following chemotherapy and/or immunotherapy agents kyprolis,darzalex, x geva     To help prevent nausea and vomiting after your treatment, we encourage you to take your nausea medication as directed.  BELOW ARE SYMPTOMS THAT SHOULD BE REPORTED IMMEDIATELY: *FEVER GREATER THAN 100.4 F (38 C) OR HIGHER *CHILLS OR SWEATING *NAUSEA AND VOMITING THAT IS NOT CONTROLLED WITH YOUR NAUSEA MEDICATION *UNUSUAL SHORTNESS OF BREATH *UNUSUAL BRUISING OR BLEEDING *URINARY PROBLEMS (pain or burning when urinating, or frequent urination) *BOWEL PROBLEMS (unusual diarrhea, constipation, pain near the anus) TENDERNESS IN MOUTH AND THROAT WITH OR WITHOUT PRESENCE OF ULCERS (sore throat, sores in mouth, or a toothache) UNUSUAL RASH, SWELLING OR PAIN  UNUSUAL VAGINAL DISCHARGE OR ITCHING   Items with * indicate a potential emergency and should be followed up as soon as possible or go to the Emergency Department if any problems should occur.  Please show the CHEMOTHERAPY ALERT CARD or IMMUNOTHERAPY ALERT CARD at  check-in to the Emergency Department and triage nurse. Should you have questions after your visit or need to cancel or reschedule your appointment, please contact [facility]  [phone] and follow the prompts.  Office hours are 8:00 a.m. to 4:30 p.m. Monday - Friday. Please note that voicemails left after 4:00 p.m. may not be returned until the following business day.  We are closed weekends and major holidays. You have access to a nurse at all times for urgent questions. Please call the main number to the clinic [phone] and follow the prompts.  For any non-urgent questions, you may also contact your provider using MyChart. We now offer e-Visits for anyone 52 and older to request care online for non-urgent symptoms. For details visit [email or web address].   Also download the MyChart app! Go to the app store, search "MyChart", open the app, select [facility], and log in with your MyChart username and password.  Due to Covid, a mask is required upon entering the hospital/clinic. If you do not have a mask, one will be given to you upon arrival. For doctor visits, patients may have 1 support person aged 77 or older with them. For treatment visits, patients cannot have anyone with them due to current Covid guidelines and our immunocompromised population.

## 2021-03-06 NOTE — Progress Notes (Signed)
Hematology and Oncology Follow Up Visit  Debra Ruiz [id number] 04/04/1946 75 y.o. 03/06/2021   Principle Diagnosis:  IgG Kappa myeloma - compression fractures - 13q-, +11, 14q+ - hyperdiploid -- progressive Iron def anemia  Completed Therapy: S/p Kyphoplasty of T11 and T12 XRT to lower spine-completed on 09/25/2016  Current Therapy:    Faspro -- start on 04/05/2020 -- s/p cycle #6 -- Kyprolis added on 07/20/2020 -- s/p cycle #11 Ninlaro 2.3 mg po q week (3 on/1 off) -- start on 01/24/2020 -- d/c on 03/29/2020 due to toxicity Velcade/Pomalyst/Decadron -Start on 09/02/2018 --Pomalyst on hold since 11/09/2018.  D/C on 01/17/2020 Xgeva 142m Sq q  3 month - next dose in 05/2021  IV Injectafer -- last dose given on 11/2018   Interim History:  Debra Ruiz here today for follow-up.  Everything is going pretty well for her right now.  Unfortunate, her lower back is not doing too well.  She may have some sciatica down the right leg.  She had an MRI done yesterday.  I do not see the results back as of yet.  Her myeloma seems to be doing quite nicely.  Her last monoclonal spike was 0.1 g/dL.  The IgG level was 360 mg/dL.  The Kappa light chain was 0.5 mg/dL.  Her appetite is doing okay.  She has gained a couple pounds.  She has had no problems with nausea or vomiting.  She has had no cough or shortness of breath.  She has had no bleeding..  She has little bit of diarrhea after treatment.  She does not take anything for this as it appears to be self-limited.  She has had no rashes.  There is no fever.  Overall, I would say performance status is ECOG 1.       Medications:  Allergies as of 03/06/2021       Reactions   Crestor [rosuvastatin Calcium]    Causes liver functions to be elevated        Medication List        Accurate as of March 06, 2021  8:11 AM. If you have any questions, ask your nurse or doctor.          ALPRAZolam 0.25 MG tablet Commonly known as:  XANAX Take 1 tablet (0.25 mg total) by mouth 2 (two) times daily as needed. for anxiety   amLODipine 5 MG tablet Commonly known as: NORVASC TAKE 1 TABLET(5 MG) BY MOUTH DAILY   aspirin 81 MG EC tablet Take 81 mg by mouth every other day.   atorvastatin 20 MG tablet Commonly known as: LIPITOR TAKE 1 TABLET(20 MG) BY MOUTH DAILY   buPROPion 300 MG 24 hr tablet Commonly known as: Wellbutrin XL Take 1 tablet (300 mg total) by mouth daily.   dexamethasone 4 MG tablet Commonly known as: DECADRON TAKE 3 TABLETS BY MOUTH 1 TIME A WEEK   escitalopram 20 MG tablet Commonly known as: LEXAPRO TAKE 1 TABLET(20 MG) BY MOUTH DAILY   famciclovir 250 MG tablet Commonly known as: FAMVIR TAKE 1 TABLET(250 MG) BY MOUTH DAILY   IRON PO Take by mouth daily.   LORazepam 0.5 MG tablet Commonly known as: Ativan Take 1 tablet (0.5 mg total) by mouth every 6 (six) hours as needed (Nausea or vomiting).   nebivolol 5 MG tablet Commonly known as: Bystolic TAKE 1 TABLET(5 MG) BY MOUTH DAILY   ondansetron 8 MG tablet Commonly known as: Zofran Take 1 tablet (8 mg total) by  mouth 2 (two) times daily as needed (Nausea or vomiting).   potassium chloride SA 20 MEQ tablet Commonly known as: KLOR-CON TAKE 1 TABLET(20 MEQ) BY MOUTH DAILY   prochlorperazine 10 MG tablet Commonly known as: COMPAZINE TAKE 1 TABLET(10 MG) BY MOUTH EVERY 6 HOURS AS NEEDED FOR NAUSEA OR VOMITING   REFRESH DRY EYE THERAPY OP Apply to eye 3 (three) times daily.   temazepam 7.5 MG capsule Commonly known as: RESTORIL TAKE 1 CAPSULE BY MOUTH AT BEDTIME AS NEEDED FOR SLEEP   Vitamin B-12 5000 MCG Tbdp Take by mouth daily.   Vitamin D 50 MCG (2000 UT) Caps Take 2,000 Units by mouth daily.        Allergies:  Allergies  Allergen Reactions   Crestor [Rosuvastatin Calcium]     Causes liver functions to be elevated    Past Medical History, Surgical history, Social history, and Family History were reviewed and  updated.  Review of Systems: Review of Systems  Constitutional: Negative.   HENT: Negative.    Eyes: Negative.   Respiratory: Negative.    Cardiovascular: Negative.   Gastrointestinal: Negative.   Genitourinary: Negative.   Musculoskeletal:  Positive for back pain.  Skin: Negative.   Neurological: Negative.   Endo/Heme/Allergies: Negative.   Psychiatric/Behavioral: Negative.     Physical Exam:  vitals were not taken for this visit.   Wt Readings from Last 3 Encounters:  02/05/21 102 lb (46.3 kg)  01/10/21 106 lb (48.1 kg)  12/14/20 108 lb (49 kg)    Physical Exam Vitals reviewed.  HENT:     Head: Normocephalic and atraumatic.  Eyes:     Pupils: Pupils are equal, round, and reactive to light.  Cardiovascular:     Rate and Rhythm: Normal rate and regular rhythm.     Heart sounds: Normal heart sounds.  Pulmonary:     Effort: Pulmonary effort is normal.     Breath sounds: Normal breath sounds.  Abdominal:     General: Bowel sounds are normal.     Palpations: Abdomen is soft.  Musculoskeletal:        General: No tenderness or deformity. Normal range of motion.     Cervical back: Normal range of motion.  Lymphadenopathy:     Cervical: No cervical adenopathy.  Skin:    General: Skin is warm and dry.     Findings: No erythema or rash.  Neurological:     Mental Status: She is alert and oriented to person, place, and time.  Psychiatric:        Behavior: Behavior normal.        Thought Content: Thought content normal.        Judgment: Judgment normal.      Lab Results  Component Value Date   WBC 7.8 03/06/2021   HGB 13.7 03/06/2021   HCT 40.6 03/06/2021   MCV 106.6 (H) 03/06/2021   PLT 229 03/06/2021   Lab Results  Component Value Date   FERRITIN 258 10/25/2020   IRON 136 10/25/2020   TIBC 353 10/25/2020   UIBC 216 10/25/2020   IRONPCTSAT 39 10/25/2020   Lab Results  Component Value Date   RBC 3.81 (L) 03/06/2021   Lab Results  Component Value Date    KPAFRELGTCHN 4.7 02/05/2021   LAMBDASER <1.5 (L) 02/05/2021   KAPLAMBRATIO >3.13 (H) 02/05/2021   Lab Results  Component Value Date   IGGSERUM 355 (L) 02/05/2021   IGGSERUM 377 (L) 02/05/2021   IGA <5 (L) 02/05/2021  IGA <5 (L) 02/05/2021   IGMSERUM 9 (L) 02/05/2021   IGMSERUM 11 (L) 02/05/2021   Lab Results  Component Value Date   TOTALPROTELP 6.1 02/05/2021   ALBUMINELP 3.8 02/05/2021   A1GS 0.3 02/05/2021   A2GS 0.8 02/05/2021   BETS 0.9 02/05/2021   GAMS 0.3 (L) 02/05/2021   MSPIKE 0.1 (H) 02/05/2021   SPEI Comment 01/28/2018     Chemistry      Component Value Date/Time   NA 138 02/05/2021 0825   NA 148 (H) 07/16/2017 1128   NA 143 03/05/2017 1051   K 3.9 02/05/2021 0825   K 3.8 07/16/2017 1128   K 3.7 03/05/2017 1051   CL 102 02/05/2021 0825   CL 106 07/16/2017 1128   CO2 22 02/05/2021 0825   CO2 29 07/16/2017 1128   CO2 25 03/05/2017 1051   BUN 25 (H) 02/05/2021 0825   BUN 23 (H) 07/16/2017 1128   BUN 18.2 03/05/2017 1051   CREATININE 0.73 02/05/2021 0825   CREATININE 0.75 06/20/2020 1127   CREATININE 0.8 03/05/2017 1051      Component Value Date/Time   CALCIUM 9.1 02/05/2021 0825   CALCIUM 9.7 07/16/2017 1128   CALCIUM 8.9 03/05/2017 1051   ALKPHOS 64 02/05/2021 0825   ALKPHOS 71 07/16/2017 1128   ALKPHOS 67 03/05/2017 1051   AST 13 (L) 02/05/2021 0825   AST 15 03/05/2017 1051   ALT 19 02/05/2021 0825   ALT 23 07/16/2017 1128   ALT 17 03/05/2017 1051   BILITOT 0.5 02/05/2021 0825   BILITOT 0.55 03/05/2017 1051        Impression and Plan: Ms. Varnadore is a very pleasant 75 yo caucasian female with IgG Kappa Myeloma.    Everything seems to be going taking quite nicely for her myeloma.  We are treating her monthly now.  This is certainly amenable to her schedule.  This works well for her.  Hopefully will continue to see a response.  We will plan to get her back in another month.    [name], MD 8/16/20228:11 AM

## 2021-03-07 LAB — KAPPA/LAMBDA LIGHT CHAINS
Kappa free light chain: 12.8 mg/L (ref 3.3–19.4)
Kappa, lambda light chain ratio: 7.53 — ABNORMAL HIGH (ref 0.26–1.65)
Lambda free light chains: 1.7 mg/L — ABNORMAL LOW (ref 5.7–26.3)

## 2021-03-07 LAB — IGG, IGA, IGM
IgA: <5 mg/dL — ABNORMAL LOW (ref 64–422)
IgG (Immunoglobin G), Serum: 351 mg/dL — ABNORMAL LOW (ref 586–1602)
IgM (Immunoglobulin M), Srm: 10 mg/dL — ABNORMAL LOW (ref 26–217)

## 2021-03-09 DIAGNOSIS — M48062 Spinal stenosis, lumbar region with neurogenic claudication: Secondary | ICD-10-CM | POA: Diagnosis not present

## 2021-03-12 LAB — PROTEIN ELECTROPHORESIS, SERUM, WITH REFLEX
A/G Ratio: 1.7 (ref 0.7–1.7)
Albumin ELP: 3.6 g/dL (ref 2.9–4.4)
Alpha-1-Globulin: 0.2 g/dL (ref 0.0–0.4)
Alpha-2-Globulin: 0.7 g/dL (ref 0.4–1.0)
Beta Globulin: 0.9 g/dL (ref 0.7–1.3)
Gamma Globulin: 0.3 g/dL — ABNORMAL LOW (ref 0.4–1.8)
Globulin, Total: 2.1 g/dL — ABNORMAL LOW (ref 2.2–3.9)
M-Spike, %: 0.2 g/dL — ABNORMAL HIGH
SPEP Interpretation: 0
Total Protein ELP: 5.7 g/dL — ABNORMAL LOW (ref 6.0–8.5)

## 2021-03-12 LAB — IMMUNOFIXATION REFLEX, SERUM
IgA: 5 mg/dL — ABNORMAL LOW (ref 64–422)
IgG (Immunoglobin G), Serum: 400 mg/dL — ABNORMAL LOW (ref 586–1602)
IgM (Immunoglobulin M), Srm: 13 mg/dL — ABNORMAL LOW (ref 26–217)

## 2021-03-20 DIAGNOSIS — M48062 Spinal stenosis, lumbar region with neurogenic claudication: Secondary | ICD-10-CM | POA: Diagnosis not present

## 2021-03-24 ENCOUNTER — Other Ambulatory Visit: Payer: Self-pay | Admitting: Internal Medicine

## 2021-04-04 ENCOUNTER — Inpatient Hospital Stay: Payer: PPO

## 2021-04-04 ENCOUNTER — Encounter: Payer: Self-pay | Admitting: Hematology & Oncology

## 2021-04-04 ENCOUNTER — Inpatient Hospital Stay (HOSPITAL_BASED_OUTPATIENT_CLINIC_OR_DEPARTMENT_OTHER): Payer: PPO | Admitting: Hematology & Oncology

## 2021-04-04 ENCOUNTER — Other Ambulatory Visit: Payer: Self-pay

## 2021-04-04 ENCOUNTER — Inpatient Hospital Stay: Payer: PPO | Attending: Hematology & Oncology

## 2021-04-04 VITALS — BP 137/71 | HR 76 | Temp 97.8°F | Resp 19 | Wt 104.0 lb

## 2021-04-04 DIAGNOSIS — C9000 Multiple myeloma not having achieved remission: Secondary | ICD-10-CM | POA: Diagnosis not present

## 2021-04-04 DIAGNOSIS — Z79899 Other long term (current) drug therapy: Secondary | ICD-10-CM | POA: Diagnosis not present

## 2021-04-04 DIAGNOSIS — D509 Iron deficiency anemia, unspecified: Secondary | ICD-10-CM | POA: Insufficient documentation

## 2021-04-04 DIAGNOSIS — Z5112 Encounter for antineoplastic immunotherapy: Secondary | ICD-10-CM | POA: Diagnosis not present

## 2021-04-04 LAB — CBC WITH DIFFERENTIAL (CANCER CENTER ONLY)
Abs Immature Granulocytes: 0.04 10*3/uL (ref 0.00–0.07)
Basophils Absolute: 0 10*3/uL (ref 0.0–0.1)
Basophils Relative: 0 %
Eosinophils Absolute: 0 10*3/uL (ref 0.0–0.5)
Eosinophils Relative: 0 %
HCT: 41.5 % (ref 36.0–46.0)
Hemoglobin: 13.7 g/dL (ref 12.0–15.0)
Immature Granulocytes: 1 %
Lymphocytes Relative: 14 %
Lymphs Abs: 1 10*3/uL (ref 0.7–4.0)
MCH: 35.4 pg — ABNORMAL HIGH (ref 26.0–34.0)
MCHC: 33 g/dL (ref 30.0–36.0)
MCV: 107.2 fL — ABNORMAL HIGH (ref 80.0–100.0)
Monocytes Absolute: 0.7 10*3/uL (ref 0.1–1.0)
Monocytes Relative: 10 %
Neutro Abs: 5.7 10*3/uL (ref 1.7–7.7)
Neutrophils Relative %: 75 %
Platelet Count: 210 10*3/uL (ref 150–400)
RBC: 3.87 MIL/uL (ref 3.87–5.11)
RDW: 12.1 % (ref 11.5–15.5)
WBC Count: 7.5 10*3/uL (ref 4.0–10.5)
nRBC: 0 % (ref 0.0–0.2)

## 2021-04-04 LAB — CMP (CANCER CENTER ONLY)
ALT: 15 U/L (ref 0–44)
AST: 15 U/L (ref 15–41)
Albumin: 4.4 g/dL (ref 3.5–5.0)
Alkaline Phosphatase: 57 U/L (ref 38–126)
Anion gap: 9 (ref 5–15)
BUN: 23 mg/dL (ref 8–23)
CO2: 26 mmol/L (ref 22–32)
Calcium: 9.1 mg/dL (ref 8.9–10.3)
Chloride: 104 mmol/L (ref 98–111)
Creatinine: 0.74 mg/dL (ref 0.44–1.00)
GFR, Estimated: >60 mL/min (ref >60)
Glucose, Bld: 117 mg/dL — ABNORMAL HIGH (ref 70–99)
Potassium: 4.1 mmol/L (ref 3.5–5.1)
Sodium: 139 mmol/L (ref 135–145)
Total Bilirubin: 0.5 mg/dL (ref 0.3–1.2)
Total Protein: 6 g/dL — ABNORMAL LOW (ref 6.5–8.1)

## 2021-04-04 LAB — LACTATE DEHYDROGENASE: LDH: 195 U/L — ABNORMAL HIGH (ref 98–192)

## 2021-04-04 MED ORDER — DARATUMUMAB-HYALURONIDASE-FIHJ 1800-30000 MG-UT/15ML ~~LOC~~ SOLN
1800.0000 mg | Freq: Once | SUBCUTANEOUS | Status: AC
  Administered 2021-04-04: 1800 mg via SUBCUTANEOUS
  Filled 2021-04-04: qty 15

## 2021-04-04 MED ORDER — SODIUM CHLORIDE 0.9 % IV SOLN: Freq: Once | INTRAVENOUS | Status: AC

## 2021-04-04 MED ORDER — DEXTROSE 5 % IV SOLN
70.0000 mg/m2 | Freq: Once | INTRAVENOUS | Status: AC
  Administered 2021-04-04: 100 mg via INTRAVENOUS
  Filled 2021-04-04: qty 15

## 2021-04-04 MED ORDER — SODIUM CHLORIDE 0.9 % IV SOLN
40.0000 mg | Freq: Once | INTRAVENOUS | Status: AC
  Administered 2021-04-04: 40 mg via INTRAVENOUS
  Filled 2021-04-04: qty 4

## 2021-04-04 NOTE — Progress Notes (Signed)
Hematology and Oncology Follow Up Visit  Debra Ruiz [id number] 1945-10-07 75 y.o. 04/04/2021   Principle Diagnosis:  IgG Kappa myeloma - compression fractures - 13q-, +11, 14q+ - hyperdiploid -- progressive Iron def anemia  Completed Therapy: S/p Kyphoplasty of T11 and T12 XRT to lower spine-completed on 09/25/2016  Current Therapy:    Faspro -- start on 04/05/2020 -- s/p cycle #7 -- Kyprolis added on 07/20/2020 -- s/p cycle #12 Ninlaro 2.3 mg po q week (3 on/1 off) -- start on 01/24/2020 -- d/c on 03/29/2020 due to toxicity Velcade/Pomalyst/Decadron -Start on 09/02/2018 --Pomalyst on hold since 11/09/2018.  D/C on 01/17/2020 Xgeva 163m Sq q  3 month - next dose in 05/2021  IV Injectafer -- last dose given on 11/2018   Interim History:  Debra Ruiz here today for follow-up.  She is still having problems with her lower back.  She is going for an epidural injection this Friday.  Hopefully, it will help with some of the sciatic nerve inflammation.  She has done well otherwise with the treatment.  Her last myeloma studies were stable.  Her monoclonal spike was 0.2 g/dL.  The IgG level was everything is going pretty well for her right now.  Unfortunate, her lower back is not doing too well.  She may have some sciatica down the right leg.  She had an MRI done yesterday.  I do not see the results back as of yet.  Her myeloma seems to be doing quite nicely.  Her last monoclonal spike was 0.1 g/dL.  The IgG level was 370 mg/dL.  The Kappa light chain was 1.3  mg/dL.  We will have to watch the Kappa light chain closely.  Her appetite is doing okay.  Her weight really has not gone up yet.  There is still a little bit of diarrhea.  She does not take anything for this.  She says it does not happen all the time.  There is no fever.  Her husband has dementia.  This is becoming more of an issue for her.  She has had no leg swelling.  She has had no headache.  Overall, performance status is  ECOG 1.    Medications:  Allergies as of 04/04/2021       Reactions   Crestor [rosuvastatin Calcium]    Causes liver functions to be elevated        Medication List        Accurate as of April 04, 2021  9:23 AM. If you have any questions, ask your nurse or doctor.          ALPRAZolam 0.25 MG tablet Commonly known as: XANAX Take 1 tablet (0.25 mg total) by mouth 2 (two) times daily as needed. for anxiety   amLODipine 5 MG tablet Commonly known as: NORVASC TAKE 1 TABLET(5 MG) BY MOUTH DAILY   aspirin 81 MG EC tablet Take 81 mg by mouth every other day.   atorvastatin 20 MG tablet Commonly known as: LIPITOR TAKE 1 TABLET(20 MG) BY MOUTH DAILY   buPROPion 300 MG 24 hr tablet Commonly known as: WELLBUTRIN XL TAKE 1 TABLET(300 MG) BY MOUTH DAILY   dexamethasone 4 MG tablet Commonly known as: DECADRON TAKE 3 TABLETS BY MOUTH 1 TIME A WEEK   escitalopram 20 MG tablet Commonly known as: LEXAPRO TAKE 1 TABLET(20 MG) BY MOUTH DAILY   famciclovir 250 MG tablet Commonly known as: FAMVIR TAKE 1 TABLET(250 MG) BY MOUTH DAILY   IRON PO  Take by mouth daily.   LORazepam 0.5 MG tablet Commonly known as: Ativan Take 1 tablet (0.5 mg total) by mouth every 6 (six) hours as needed (Nausea or vomiting).   nebivolol 5 MG tablet Commonly known as: Bystolic TAKE 1 TABLET(5 MG) BY MOUTH DAILY   ondansetron 8 MG tablet Commonly known as: Zofran Take 1 tablet (8 mg total) by mouth 2 (two) times daily as needed (Nausea or vomiting).   potassium chloride SA 20 MEQ tablet Commonly known as: KLOR-CON TAKE 1 TABLET(20 MEQ) BY MOUTH DAILY   prochlorperazine 10 MG tablet Commonly known as: COMPAZINE TAKE 1 TABLET(10 MG) BY MOUTH EVERY 6 HOURS AS NEEDED FOR NAUSEA OR VOMITING   REFRESH DRY EYE THERAPY OP Apply to eye 3 (three) times daily.   temazepam 7.5 MG capsule Commonly known as: RESTORIL TAKE 1 CAPSULE BY MOUTH AT BEDTIME AS NEEDED FOR SLEEP   Vitamin B-12 5000  MCG Tbdp Take by mouth daily.   Vitamin D 50 MCG (2000 UT) Caps Take 2,000 Units by mouth daily.        Allergies:  Allergies  Allergen Reactions   Crestor [Rosuvastatin Calcium]     Causes liver functions to be elevated    Past Medical History, Surgical history, Social history, and Family History were reviewed and updated.  Review of Systems: Review of Systems  Constitutional: Negative.   HENT: Negative.    Eyes: Negative.   Respiratory: Negative.    Cardiovascular: Negative.   Gastrointestinal: Negative.   Genitourinary: Negative.   Musculoskeletal:  Positive for back pain.  Skin: Negative.   Neurological: Negative.   Endo/Heme/Allergies: Negative.   Psychiatric/Behavioral: Negative.     Physical Exam:  weight is 104 lb (47.2 kg). Her oral temperature is 97.8 F (36.6 C). Her blood pressure is 137/71 and her pulse is 76. Her respiration is 19 and oxygen saturation is 96%.   Wt Readings from Last 3 Encounters:  04/04/21 104 lb (47.2 kg)  03/06/21 104 lb (47.2 kg)  02/05/21 102 lb (46.3 kg)    Physical Exam Vitals reviewed.  HENT:     Head: Normocephalic and atraumatic.  Eyes:     Pupils: Pupils are equal, round, and reactive to light.  Cardiovascular:     Rate and Rhythm: Normal rate and regular rhythm.     Heart sounds: Normal heart sounds.  Pulmonary:     Effort: Pulmonary effort is normal.     Breath sounds: Normal breath sounds.  Abdominal:     General: Bowel sounds are normal.     Palpations: Abdomen is soft.  Musculoskeletal:        General: No tenderness or deformity. Normal range of motion.     Cervical back: Normal range of motion.  Lymphadenopathy:     Cervical: No cervical adenopathy.  Skin:    General: Skin is warm and dry.     Findings: No erythema or rash.  Neurological:     Mental Status: She is alert and oriented to person, place, and time.  Psychiatric:        Behavior: Behavior normal.        Thought Content: Thought content  normal.        Judgment: Judgment normal.      Lab Results  Component Value Date   WBC 7.5 04/04/2021   HGB 13.7 04/04/2021   HCT 41.5 04/04/2021   MCV 107.2 (H) 04/04/2021   PLT 210 04/04/2021   Lab Results  Component Value  Date   FERRITIN 258 10/25/2020   IRON 136 10/25/2020   TIBC 353 10/25/2020   UIBC 216 10/25/2020   IRONPCTSAT 39 10/25/2020   Lab Results  Component Value Date   RBC 3.87 04/04/2021   Lab Results  Component Value Date   KPAFRELGTCHN 12.8 03/06/2021   LAMBDASER 1.7 (L) 03/06/2021   KAPLAMBRATIO 7.53 (H) 03/06/2021   Lab Results  Component Value Date   IGGSERUM 351 (L) 03/06/2021   IGGSERUM 400 (L) 03/06/2021   IGA <5 (L) 03/06/2021   IGA 5 (L) 03/06/2021   IGMSERUM 10 (L) 03/06/2021   IGMSERUM 13 (L) 03/06/2021   Lab Results  Component Value Date   TOTALPROTELP 5.7 (L) 03/06/2021   ALBUMINELP 3.6 03/06/2021   A1GS 0.2 03/06/2021   A2GS 0.7 03/06/2021   BETS 0.9 03/06/2021   GAMS 0.3 (L) 03/06/2021   MSPIKE 0.2 (H) 03/06/2021   SPEI Comment 01/28/2018     Chemistry      Component Value Date/Time   NA 139 04/04/2021 0852   NA 148 (H) 07/16/2017 1128   NA 143 03/05/2017 1051   K 4.1 04/04/2021 0852   K 3.8 07/16/2017 1128   K 3.7 03/05/2017 1051   CL 104 04/04/2021 0852   CL 106 07/16/2017 1128   CO2 26 04/04/2021 0852   CO2 29 07/16/2017 1128   CO2 25 03/05/2017 1051   BUN 23 04/04/2021 0852   BUN 23 (H) 07/16/2017 1128   BUN 18.2 03/05/2017 1051   CREATININE 0.74 04/04/2021 0852   CREATININE 0.75 06/20/2020 1127   CREATININE 0.8 03/05/2017 1051      Component Value Date/Time   CALCIUM 9.1 04/04/2021 0852   CALCIUM 9.7 07/16/2017 1128   CALCIUM 8.9 03/05/2017 1051   ALKPHOS 57 04/04/2021 0852   ALKPHOS 71 07/16/2017 1128   ALKPHOS 67 03/05/2017 1051   AST 15 04/04/2021 0852   AST 15 03/05/2017 1051   ALT 15 04/04/2021 0852   ALT 23 07/16/2017 1128   ALT 17 03/05/2017 1051   BILITOT 0.5 04/04/2021 0852   BILITOT  0.55 03/05/2017 1051        Impression and Plan: Ms. Grosch is a very pleasant 75 yo caucasian female with IgG Kappa Myeloma.    Everything seems to be going taking quite nicely for her myeloma.  Again, we will have to watch the Kappa light chain.  Hopefully, this is not can be an indicator of progression for her.  We will plan to get her back in another month.  Hopefully her back will be doing a little bit better.     [name], MD 9/14/20229:23 AM

## 2021-04-05 LAB — IGG, IGA, IGM
IgA: 5 mg/dL — ABNORMAL LOW (ref 64–422)
IgG (Immunoglobin G), Serum: 398 mg/dL — ABNORMAL LOW (ref 586–1602)
IgM (Immunoglobulin M), Srm: 9 mg/dL — ABNORMAL LOW (ref 26–217)

## 2021-04-05 LAB — KAPPA/LAMBDA LIGHT CHAINS
Kappa free light chain: 10.6 mg/L (ref 3.3–19.4)
Kappa, lambda light chain ratio: >7.07 — ABNORMAL HIGH (ref 0.26–1.65)
Lambda free light chains: <1.5 mg/L — ABNORMAL LOW (ref 5.7–26.3)

## 2021-04-06 ENCOUNTER — Telehealth: Payer: Self-pay | Admitting: *Deleted

## 2021-04-06 NOTE — Telephone Encounter (Signed)
-----   Message from [name], MD sent at 04/05/2021  5:02 PM EDT ----- Call - the kappa light chain is a little better!!  Debra Ruiz

## 2021-04-06 NOTE — Telephone Encounter (Signed)
Notified pt of results. No concerns at this time. 

## 2021-04-10 LAB — IMMUNOFIXATION REFLEX, SERUM

## 2021-04-10 LAB — PROTEIN ELECTROPHORESIS, SERUM, WITH REFLEX
A/G Ratio: 1.8 — ABNORMAL HIGH (ref 0.7–1.7)
Albumin ELP: 3.8 g/dL (ref 2.9–4.4)
Alpha-1-Globulin: 0.2 g/dL (ref 0.0–0.4)
Alpha-2-Globulin: 0.7 g/dL (ref 0.4–1.0)
Beta Globulin: 0.8 g/dL (ref 0.7–1.3)
Gamma Globulin: 0.3 g/dL — ABNORMAL LOW (ref 0.4–1.8)
Globulin, Total: 2.1 g/dL — ABNORMAL LOW (ref 2.2–3.9)
M-Spike, %: 0.2 g/dL — ABNORMAL HIGH
SPEP Interpretation: 0
Total Protein ELP: 5.9 g/dL — ABNORMAL LOW (ref 6.0–8.5)

## 2021-04-12 DIAGNOSIS — M48062 Spinal stenosis, lumbar region with neurogenic claudication: Secondary | ICD-10-CM | POA: Diagnosis not present

## 2021-04-23 ENCOUNTER — Other Ambulatory Visit: Payer: Self-pay | Admitting: Hematology & Oncology

## 2021-04-23 DIAGNOSIS — R5383 Other fatigue: Secondary | ICD-10-CM

## 2021-04-23 DIAGNOSIS — C9000 Multiple myeloma not having achieved remission: Secondary | ICD-10-CM

## 2021-05-01 ENCOUNTER — Encounter: Payer: Self-pay | Admitting: Hematology & Oncology

## 2021-05-01 ENCOUNTER — Inpatient Hospital Stay (HOSPITAL_BASED_OUTPATIENT_CLINIC_OR_DEPARTMENT_OTHER): Payer: PPO | Admitting: Hematology & Oncology

## 2021-05-01 ENCOUNTER — Other Ambulatory Visit: Payer: Self-pay

## 2021-05-01 ENCOUNTER — Inpatient Hospital Stay: Payer: PPO

## 2021-05-01 ENCOUNTER — Inpatient Hospital Stay: Payer: PPO | Attending: Hematology & Oncology

## 2021-05-01 VITALS — BP 145/84 | HR 98 | Temp 98.0°F | Resp 18 | Wt 106.5 lb

## 2021-05-01 DIAGNOSIS — Z7189 Other specified counseling: Secondary | ICD-10-CM | POA: Diagnosis not present

## 2021-05-01 DIAGNOSIS — C9000 Multiple myeloma not having achieved remission: Secondary | ICD-10-CM | POA: Insufficient documentation

## 2021-05-01 DIAGNOSIS — D509 Iron deficiency anemia, unspecified: Secondary | ICD-10-CM | POA: Diagnosis not present

## 2021-05-01 DIAGNOSIS — Z79899 Other long term (current) drug therapy: Secondary | ICD-10-CM | POA: Insufficient documentation

## 2021-05-01 DIAGNOSIS — Z5112 Encounter for antineoplastic immunotherapy: Secondary | ICD-10-CM | POA: Diagnosis not present

## 2021-05-01 LAB — CBC WITH DIFFERENTIAL (CANCER CENTER ONLY)
Abs Immature Granulocytes: 0.05 10*3/uL (ref 0.00–0.07)
Basophils Absolute: 0 10*3/uL (ref 0.0–0.1)
Basophils Relative: 0 %
Eosinophils Absolute: 0 10*3/uL (ref 0.0–0.5)
Eosinophils Relative: 0 %
HCT: 39.4 % (ref 36.0–46.0)
Hemoglobin: 13.5 g/dL (ref 12.0–15.0)
Immature Granulocytes: 1 %
Lymphocytes Relative: 10 %
Lymphs Abs: 0.8 10*3/uL (ref 0.7–4.0)
MCH: 34.9 pg — ABNORMAL HIGH (ref 26.0–34.0)
MCHC: 34.3 g/dL (ref 30.0–36.0)
MCV: 101.8 fL — ABNORMAL HIGH (ref 80.0–100.0)
Monocytes Absolute: 0.2 10*3/uL (ref 0.1–1.0)
Monocytes Relative: 3 %
Neutro Abs: 6.2 10*3/uL (ref 1.7–7.7)
Neutrophils Relative %: 86 %
Platelet Count: 195 10*3/uL (ref 150–400)
RBC: 3.87 MIL/uL (ref 3.87–5.11)
RDW: 11.9 % (ref 11.5–15.5)
WBC Count: 7.2 10*3/uL (ref 4.0–10.5)
nRBC: 0 % (ref 0.0–0.2)

## 2021-05-01 LAB — CMP (CANCER CENTER ONLY)
ALT: 16 U/L (ref 0–44)
AST: 14 U/L — ABNORMAL LOW (ref 15–41)
Albumin: 4.3 g/dL (ref 3.5–5.0)
Alkaline Phosphatase: 67 U/L (ref 38–126)
Anion gap: 13 (ref 5–15)
BUN: 19 mg/dL (ref 8–23)
CO2: 21 mmol/L — ABNORMAL LOW (ref 22–32)
Calcium: 9.5 mg/dL (ref 8.9–10.3)
Chloride: 103 mmol/L (ref 98–111)
Creatinine: 0.73 mg/dL (ref 0.44–1.00)
GFR, Estimated: >60 mL/min (ref >60)
Glucose, Bld: 235 mg/dL — ABNORMAL HIGH (ref 70–99)
Potassium: 4.7 mmol/L (ref 3.5–5.1)
Sodium: 137 mmol/L (ref 135–145)
Total Bilirubin: 0.4 mg/dL (ref 0.3–1.2)
Total Protein: 6 g/dL — ABNORMAL LOW (ref 6.5–8.1)

## 2021-05-01 MED ORDER — SODIUM CHLORIDE 0.9 % IV SOLN: Freq: Once | INTRAVENOUS | Status: AC

## 2021-05-01 MED ORDER — SODIUM CHLORIDE 0.9 % IV SOLN
40.0000 mg | Freq: Once | INTRAVENOUS | Status: AC
  Administered 2021-05-01: 40 mg via INTRAVENOUS
  Filled 2021-05-01: qty 4

## 2021-05-01 MED ORDER — DIPHENHYDRAMINE HCL 25 MG PO CAPS: 25.0000 mg | ORAL_CAPSULE | Freq: Once | ORAL | Status: DC

## 2021-05-01 MED ORDER — DEXTROSE 5 % IV SOLN
70.0000 mg/m2 | Freq: Once | INTRAVENOUS | Status: AC
  Administered 2021-05-01: 100 mg via INTRAVENOUS
  Filled 2021-05-01: qty 30

## 2021-05-01 MED ORDER — ACETAMINOPHEN 325 MG PO TABS: 650.0000 mg | ORAL_TABLET | Freq: Once | ORAL | Status: DC

## 2021-05-01 MED ORDER — DARATUMUMAB-HYALURONIDASE-FIHJ 1800-30000 MG-UT/15ML ~~LOC~~ SOLN
1800.0000 mg | Freq: Once | SUBCUTANEOUS | Status: AC
  Administered 2021-05-01: 1800 mg via SUBCUTANEOUS
  Filled 2021-05-01: qty 15

## 2021-05-01 NOTE — Progress Notes (Signed)
Hematology and Oncology Follow Up Visit  Debra Ruiz [id number] 05/23/1946 75 y.o. 05/01/2021   Principle Diagnosis:  IgG Kappa myeloma - compression fractures - 13q-, +11, 14q+ - hyperdiploid -- progressive Iron def anemia  Completed Therapy: S/p Kyphoplasty of T11 and T12 XRT to lower spine-completed on 09/25/2016  Current Therapy:    Faspro -- start on 04/05/2020 -- s/p cycle #8 -- Kyprolis added on 07/20/2020 -- s/p cycle #13 Ninlaro 2.3 mg po q week (3 on/1 off) -- start on 01/24/2020 -- d/c on 03/29/2020 due to toxicity Velcade/Pomalyst/Decadron -Start on 09/02/2018 --Pomalyst on hold since 11/09/2018.  D/C on 01/17/2020 Xgeva 169m Sq q  3 month - next dose in 05/2021  IV Injectafer -- last dose given on 11/2018   Interim History:  Debra Ruiz here today for follow-up.  She is still having problems with her lower back.  She had an epidural injection 2 weeks ago.  This only helped for a couple of days.  She says she may need to have something else done.  Outside of her back, she seems to be managing pretty well.  Her last myeloma studies only showed an M spike of 0.2 g/dL.  The IgG level was 400 mg/dL.  Her kappa light chain was 1.1 mg/dL..Marland Kitchen She has had no problems with bowels or bladder.  She has had no problems with cough or shortness of breath.  There is been no bleeding.  She has had no rashes.    Overall, performance status is ECOG 1.    Medications:  Allergies as of 05/01/2021       Reactions   Crestor [rosuvastatin Calcium]    Causes liver functions to be elevated        Medication List        Accurate as of May 01, 2021  9:43 AM. If you have any questions, ask your nurse or doctor.          ALPRAZolam 0.25 MG tablet Commonly known as: XANAX Take 1 tablet (0.25 mg total) by mouth 2 (two) times daily as needed. for anxiety   amLODipine 5 MG tablet Commonly known as: NORVASC TAKE 1 TABLET(5 MG) BY MOUTH DAILY   aspirin 81 MG EC  tablet Take 81 mg by mouth every other day.   atorvastatin 20 MG tablet Commonly known as: LIPITOR TAKE 1 TABLET(20 MG) BY MOUTH DAILY   buPROPion 300 MG 24 hr tablet Commonly known as: WELLBUTRIN XL TAKE 1 TABLET(300 MG) BY MOUTH DAILY   dexamethasone 4 MG tablet Commonly known as: DECADRON TAKE 3 TABLETS BY MOUTH 1 TIME A WEEK   escitalopram 20 MG tablet Commonly known as: LEXAPRO TAKE 1 TABLET(20 MG) BY MOUTH DAILY   famciclovir 250 MG tablet Commonly known as: FAMVIR TAKE 1 TABLET(250 MG) BY MOUTH DAILY   IRON PO Take by mouth daily.   LORazepam 0.5 MG tablet Commonly known as: Ativan Take 1 tablet (0.5 mg total) by mouth every 6 (six) hours as needed (Nausea or vomiting).   nebivolol 5 MG tablet Commonly known as: Bystolic TAKE 1 TABLET(5 MG) BY MOUTH DAILY   ondansetron 8 MG tablet Commonly known as: Zofran Take 1 tablet (8 mg total) by mouth 2 (two) times daily as needed (Nausea or vomiting).   potassium chloride SA 20 MEQ tablet Commonly known as: KLOR-CON TAKE 1 TABLET(20 MEQ) BY MOUTH DAILY   prochlorperazine 10 MG tablet Commonly known as: COMPAZINE TAKE 1 TABLET(10 MG) BY MOUTH EVERY  6 HOURS AS NEEDED FOR NAUSEA OR VOMITING   REFRESH DRY EYE THERAPY OP Apply to eye 3 (three) times daily.   temazepam 7.5 MG capsule Commonly known as: RESTORIL TAKE 1 CAPSULE BY MOUTH AT BEDTIME AS NEEDED FOR SLEEP   Vitamin B-12 5000 MCG Tbdp Take by mouth daily.   Vitamin D 50 MCG (2000 UT) Caps Take 2,000 Units by mouth daily.        Allergies:  Allergies  Allergen Reactions   Crestor [Rosuvastatin Calcium]     Causes liver functions to be elevated    Past Medical History, Surgical history, Social history, and Family History were reviewed and updated.  Review of Systems: Review of Systems  Constitutional: Negative.   HENT: Negative.    Eyes: Negative.   Respiratory: Negative.    Cardiovascular: Negative.   Gastrointestinal: Negative.    Genitourinary: Negative.   Musculoskeletal:  Positive for back pain.  Skin: Negative.   Neurological: Negative.   Endo/Heme/Allergies: Negative.   Psychiatric/Behavioral: Negative.     Physical Exam:  weight is 106 lb 8 oz (48.3 kg). Her oral temperature is 98 F (36.7 C). Her blood pressure is 145/84 (abnormal) and her pulse is 98. Her respiration is 18 and oxygen saturation is 100%.   Wt Readings from Last 3 Encounters:  05/01/21 106 lb 8 oz (48.3 kg)  04/04/21 104 lb (47.2 kg)  03/06/21 104 lb (47.2 kg)    Physical Exam Vitals reviewed.  HENT:     Head: Normocephalic and atraumatic.  Eyes:     Pupils: Pupils are equal, round, and reactive to light.  Cardiovascular:     Rate and Rhythm: Normal rate and regular rhythm.     Heart sounds: Normal heart sounds.  Pulmonary:     Effort: Pulmonary effort is normal.     Breath sounds: Normal breath sounds.  Abdominal:     General: Bowel sounds are normal.     Palpations: Abdomen is soft.  Musculoskeletal:        General: No tenderness or deformity. Normal range of motion.     Cervical back: Normal range of motion.  Lymphadenopathy:     Cervical: No cervical adenopathy.  Skin:    General: Skin is warm and dry.     Findings: No erythema or rash.  Neurological:     Mental Status: She is alert and oriented to person, place, and time.  Psychiatric:        Behavior: Behavior normal.        Thought Content: Thought content normal.        Judgment: Judgment normal.      Lab Results  Component Value Date   WBC 7.2 05/01/2021   HGB 13.5 05/01/2021   HCT 39.4 05/01/2021   MCV 101.8 (H) 05/01/2021   PLT 195 05/01/2021   Lab Results  Component Value Date   FERRITIN 258 10/25/2020   IRON 136 10/25/2020   TIBC 353 10/25/2020   UIBC 216 10/25/2020   IRONPCTSAT 39 10/25/2020   Lab Results  Component Value Date   RBC 3.87 05/01/2021   Lab Results  Component Value Date   KPAFRELGTCHN 10.6 04/04/2021   LAMBDASER <1.5  (L) 04/04/2021   KAPLAMBRATIO >7.07 (H) 04/04/2021   Lab Results  Component Value Date   IGGSERUM 398 (L) 04/04/2021   IGGSERUM REFERT 04/04/2021   IGA 5 (L) 04/04/2021   IGA REFERT 04/04/2021   IGMSERUM 9 (L) 04/04/2021   IGMSERUM REFERT 04/04/2021  Lab Results  Component Value Date   TOTALPROTELP 5.9 (L) 04/04/2021   ALBUMINELP 3.8 04/04/2021   A1GS 0.2 04/04/2021   A2GS 0.7 04/04/2021   BETS 0.8 04/04/2021   GAMS 0.3 (L) 04/04/2021   MSPIKE 0.2 (H) 04/04/2021   SPEI Comment 01/28/2018     Chemistry      Component Value Date/Time   NA 137 05/01/2021 0858   NA 148 (H) 07/16/2017 1128   NA 143 03/05/2017 1051   K 4.7 05/01/2021 0858   K 3.8 07/16/2017 1128   K 3.7 03/05/2017 1051   CL 103 05/01/2021 0858   CL 106 07/16/2017 1128   CO2 21 (L) 05/01/2021 0858   CO2 29 07/16/2017 1128   CO2 25 03/05/2017 1051   BUN 19 05/01/2021 0858   BUN 23 (H) 07/16/2017 1128   BUN 18.2 03/05/2017 1051   CREATININE 0.73 05/01/2021 0858   CREATININE 0.75 06/20/2020 1127   CREATININE 0.8 03/05/2017 1051      Component Value Date/Time   CALCIUM 9.5 05/01/2021 0858   CALCIUM 9.7 07/16/2017 1128   CALCIUM 8.9 03/05/2017 1051   ALKPHOS 67 05/01/2021 0858   ALKPHOS 71 07/16/2017 1128   ALKPHOS 67 03/05/2017 1051   AST 14 (L) 05/01/2021 0858   AST 15 03/05/2017 1051   ALT 16 05/01/2021 0858   ALT 23 07/16/2017 1128   ALT 17 03/05/2017 1051   BILITOT 0.4 05/01/2021 0858   BILITOT 0.55 03/05/2017 1051        Impression and Plan: Ms. Guise is a very pleasant 75 yo caucasian female with IgG Kappa Myeloma.    Everything seems to be going quite nicely for her myeloma.  I just had that she has a back issues.  I know this is really causing her to have a decreased quality of life.  I think we are on a good schedule with respect to the myeloma.  She is on the daratumumab along with Kyprolis monthly.  I think this works for her schedule.  We will plan to get her back in another month.   At that point time, she will turn 75 years old.    [name], MD 10/11/20229:43 AM

## 2021-05-02 LAB — KAPPA/LAMBDA LIGHT CHAINS
Kappa free light chain: 13.2 mg/L (ref 3.3–19.4)
Kappa, lambda light chain ratio: >8.8 — ABNORMAL HIGH (ref 0.26–1.65)
Lambda free light chains: <1.5 mg/L — ABNORMAL LOW (ref 5.7–26.3)

## 2021-05-02 LAB — IGG, IGA, IGM
IgA: <5 mg/dL — ABNORMAL LOW (ref 64–422)
IgG (Immunoglobin G), Serum: 400 mg/dL — ABNORMAL LOW (ref 586–1602)
IgM (Immunoglobulin M), Srm: 13 mg/dL — ABNORMAL LOW (ref 26–217)

## 2021-05-03 ENCOUNTER — Telehealth: Payer: Self-pay

## 2021-05-03 NOTE — Telephone Encounter (Signed)
-----   Message from [name], MD sent at 05/02/2021  5:38 PM EDT ----- Please call and tell her that the light chain is still on the low side.  He might be a little bit higher but I am not too worried about this as of yet.  Debra Ruiz

## 2021-05-03 NOTE — Telephone Encounter (Signed)
Called and informed patient of lab results, patient verbalized understanding and denies any questions or concerns at this time.   

## 2021-05-07 DIAGNOSIS — M25512 Pain in left shoulder: Secondary | ICD-10-CM | POA: Diagnosis not present

## 2021-05-07 DIAGNOSIS — M47816 Spondylosis without myelopathy or radiculopathy, lumbar region: Secondary | ICD-10-CM | POA: Diagnosis not present

## 2021-05-07 LAB — PROTEIN ELECTROPHORESIS, SERUM, WITH REFLEX
A/G Ratio: 1.6 (ref 0.7–1.7)
Albumin ELP: 3.7 g/dL (ref 2.9–4.4)
Alpha-1-Globulin: 0.3 g/dL (ref 0.0–0.4)
Alpha-2-Globulin: 0.7 g/dL (ref 0.4–1.0)
Beta Globulin: 1 g/dL (ref 0.7–1.3)
Gamma Globulin: 0.3 g/dL — ABNORMAL LOW (ref 0.4–1.8)
Globulin, Total: 2.3 g/dL (ref 2.2–3.9)
M-Spike, %: 0.4 g/dL — ABNORMAL HIGH
SPEP Interpretation: 0
Total Protein ELP: 6 g/dL (ref 6.0–8.5)

## 2021-05-07 LAB — IMMUNOFIXATION REFLEX, SERUM
IgA: <5 mg/dL — ABNORMAL LOW (ref 64–422)
IgG (Immunoglobin G), Serum: 417 mg/dL — ABNORMAL LOW (ref 586–1602)
IgM (Immunoglobulin M), Srm: 12 mg/dL — ABNORMAL LOW (ref 26–217)

## 2021-05-24 DIAGNOSIS — M47816 Spondylosis without myelopathy or radiculopathy, lumbar region: Secondary | ICD-10-CM | POA: Diagnosis not present

## 2021-05-30 ENCOUNTER — Inpatient Hospital Stay (HOSPITAL_BASED_OUTPATIENT_CLINIC_OR_DEPARTMENT_OTHER): Payer: PPO | Admitting: Hematology & Oncology

## 2021-05-30 ENCOUNTER — Ambulatory Visit: Payer: PPO

## 2021-05-30 ENCOUNTER — Inpatient Hospital Stay: Payer: PPO | Attending: Hematology & Oncology

## 2021-05-30 ENCOUNTER — Encounter: Payer: Self-pay | Admitting: Hematology & Oncology

## 2021-05-30 ENCOUNTER — Inpatient Hospital Stay: Payer: PPO

## 2021-05-30 ENCOUNTER — Other Ambulatory Visit: Payer: Self-pay

## 2021-05-30 VITALS — BP 126/69 | HR 82 | Temp 98.0°F | Resp 20 | Wt 104.4 lb

## 2021-05-30 DIAGNOSIS — Z79899 Other long term (current) drug therapy: Secondary | ICD-10-CM | POA: Insufficient documentation

## 2021-05-30 DIAGNOSIS — C9000 Multiple myeloma not having achieved remission: Secondary | ICD-10-CM | POA: Diagnosis not present

## 2021-05-30 DIAGNOSIS — D509 Iron deficiency anemia, unspecified: Secondary | ICD-10-CM | POA: Insufficient documentation

## 2021-05-30 DIAGNOSIS — Z5112 Encounter for antineoplastic immunotherapy: Secondary | ICD-10-CM | POA: Insufficient documentation

## 2021-05-30 LAB — CBC WITH DIFFERENTIAL (CANCER CENTER ONLY)
Abs Immature Granulocytes: 0.06 10*3/uL (ref 0.00–0.07)
Basophils Absolute: 0 10*3/uL (ref 0.0–0.1)
Basophils Relative: 0 %
Eosinophils Absolute: 0 10*3/uL (ref 0.0–0.5)
Eosinophils Relative: 0 %
HCT: 41.1 % (ref 36.0–46.0)
Hemoglobin: 14 g/dL (ref 12.0–15.0)
Immature Granulocytes: 1 %
Lymphocytes Relative: 14 %
Lymphs Abs: 1.2 10*3/uL (ref 0.7–4.0)
MCH: 34.9 pg — ABNORMAL HIGH (ref 26.0–34.0)
MCHC: 34.1 g/dL (ref 30.0–36.0)
MCV: 102.5 fL — ABNORMAL HIGH (ref 80.0–100.0)
Monocytes Absolute: 0.9 10*3/uL (ref 0.1–1.0)
Monocytes Relative: 11 %
Neutro Abs: 6.1 10*3/uL (ref 1.7–7.7)
Neutrophils Relative %: 74 %
Platelet Count: 226 10*3/uL (ref 150–400)
RBC: 4.01 MIL/uL (ref 3.87–5.11)
RDW: 12.6 % (ref 11.5–15.5)
WBC Count: 8.3 10*3/uL (ref 4.0–10.5)
nRBC: 0 % (ref 0.0–0.2)

## 2021-05-30 LAB — CMP (CANCER CENTER ONLY)
ALT: 17 U/L (ref 0–44)
AST: 13 U/L — ABNORMAL LOW (ref 15–41)
Albumin: 4.3 g/dL (ref 3.5–5.0)
Alkaline Phosphatase: 60 U/L (ref 38–126)
Anion gap: 11 (ref 5–15)
BUN: 32 mg/dL — ABNORMAL HIGH (ref 8–23)
CO2: 23 mmol/L (ref 22–32)
Calcium: 9.3 mg/dL (ref 8.9–10.3)
Chloride: 106 mmol/L (ref 98–111)
Creatinine: 0.78 mg/dL (ref 0.44–1.00)
GFR, Estimated: >60 mL/min (ref >60)
Glucose, Bld: 94 mg/dL (ref 70–99)
Potassium: 3.7 mmol/L (ref 3.5–5.1)
Sodium: 140 mmol/L (ref 135–145)
Total Bilirubin: 0.7 mg/dL (ref 0.3–1.2)
Total Protein: 6 g/dL — ABNORMAL LOW (ref 6.5–8.1)

## 2021-05-30 LAB — LACTATE DEHYDROGENASE: LDH: 193 U/L — ABNORMAL HIGH (ref 98–192)

## 2021-05-30 MED ORDER — SODIUM CHLORIDE 0.9 % IV SOLN
40.0000 mg | Freq: Once | INTRAVENOUS | Status: AC
  Administered 2021-05-30: 40 mg via INTRAVENOUS
  Filled 2021-05-30: qty 4

## 2021-05-30 MED ORDER — DARATUMUMAB-HYALURONIDASE-FIHJ 1800-30000 MG-UT/15ML ~~LOC~~ SOLN
1800.0000 mg | Freq: Once | SUBCUTANEOUS | Status: AC
  Administered 2021-05-30: 1800 mg via SUBCUTANEOUS
  Filled 2021-05-30: qty 15

## 2021-05-30 MED ORDER — DEXTROSE 5 % IV SOLN
70.0000 mg/m2 | Freq: Once | INTRAVENOUS | Status: AC
  Administered 2021-05-30: 100 mg via INTRAVENOUS
  Filled 2021-05-30: qty 30

## 2021-05-30 MED ORDER — DEXAMETHASONE 4 MG PO TABS: 8.0000 mg | ORAL_TABLET | Freq: Once | ORAL | Status: DC

## 2021-05-30 MED ORDER — SODIUM CHLORIDE 0.9 % IV SOLN: Freq: Once | INTRAVENOUS | Status: DC

## 2021-05-30 MED ORDER — ACETAMINOPHEN 325 MG PO TABS: 650.0000 mg | ORAL_TABLET | Freq: Once | ORAL | Status: DC

## 2021-05-30 MED ORDER — SODIUM CHLORIDE 0.9 % IV SOLN: Freq: Once | INTRAVENOUS | Status: AC

## 2021-05-30 MED ORDER — DENOSUMAB 120 MG/1.7ML ~~LOC~~ SOLN
120.0000 mg | Freq: Once | SUBCUTANEOUS | Status: AC
  Administered 2021-05-30: 120 mg via SUBCUTANEOUS
  Filled 2021-05-30: qty 1.7

## 2021-05-30 MED ORDER — DIPHENHYDRAMINE HCL 25 MG PO CAPS: 25.0000 mg | ORAL_CAPSULE | Freq: Once | ORAL | Status: DC

## 2021-05-30 NOTE — Patient Instructions (Signed)
[facility] [place]  Discharge Instructions: Thank you for choosing [facility] to provide your oncology and hematology care.   If you have a lab appointment with the [facility], please go directly to the [facility] and check in at the registration area.  Wear comfortable clothing and clothing appropriate for easy access to any Portacath or PICC line.   We strive to give you quality time with your provider. You may need to reschedule your appointment if you arrive late (15 or more minutes).  Arriving late affects you and other patients whose appointments are after yours.  Also, if you miss three or more appointments without notifying the office, you may be dismissed from the clinic at the provider's discretion.      For prescription refill requests, have your pharmacy contact our office and allow 72 hours for refills to be completed.    Today you received the following chemotherapy and/or immunotherapy agents Kyprolis, Darzalex      To help prevent nausea and vomiting after your treatment, we encourage you to take your nausea medication as directed.  BELOW ARE SYMPTOMS THAT SHOULD BE REPORTED IMMEDIATELY: *FEVER GREATER THAN 100.4 F (38 C) OR HIGHER *CHILLS OR SWEATING *NAUSEA AND VOMITING THAT IS NOT CONTROLLED WITH YOUR NAUSEA MEDICATION *UNUSUAL SHORTNESS OF BREATH *UNUSUAL BRUISING OR BLEEDING *URINARY PROBLEMS (pain or burning when urinating, or frequent urination) *BOWEL PROBLEMS (unusual diarrhea, constipation, pain near the anus) TENDERNESS IN MOUTH AND THROAT WITH OR WITHOUT PRESENCE OF ULCERS (sore throat, sores in mouth, or a toothache) UNUSUAL RASH, SWELLING OR PAIN  UNUSUAL VAGINAL DISCHARGE OR ITCHING   Items with * indicate a potential emergency and should be followed up as soon as possible or go to the Emergency Department if any problems should occur.  Please show the CHEMOTHERAPY ALERT CARD or IMMUNOTHERAPY ALERT CARD at check-in  to the Emergency Department and triage nurse. Should you have questions after your visit or need to cancel or reschedule your appointment, please contact [facility]  [phone] and follow the prompts.  Office hours are 8:00 a.m. to 4:30 p.m. Monday - Friday. Please note that voicemails left after 4:00 p.m. may not be returned until the following business day.  We are closed weekends and major holidays. You have access to a nurse at all times for urgent questions. Please call the main number to the clinic [phone] and follow the prompts.  For any non-urgent questions, you may also contact your provider using MyChart. We now offer e-Visits for anyone 5 and older to request care online for non-urgent symptoms. For details visit [email or web address].   Also download the MyChart app! Go to the app store, search "MyChart", open the app, select [facility], and log in with your MyChart username and password.  Due to Covid, a mask is required upon entering the hospital/clinic. If you do not have a mask, one will be given to you upon arrival. For doctor visits, patients may have 1 support person aged 23 or older with them. For treatment visits, patients cannot have anyone with them due to current Covid guidelines and our immunocompromised population.

## 2021-05-30 NOTE — Progress Notes (Signed)
Hematology and Oncology Follow Up Visit  Debra Ruiz [id number] [birth year] 75 y.o. 05/30/2021   Principle Diagnosis:  IgG Kappa myeloma - compression fractures - 13q-, +11, 14q+ - hyperdiploid -- progressive Iron def anemia  Completed Therapy: S/p Kyphoplasty of T11 and T12 XRT to lower spine-completed on 09/25/2016  Current Therapy:    Faspro -- start on 04/05/2020 -- s/p cycle #8 -- Kyprolis added on 07/20/2020 -- s/p cycle #13 Ninlaro 2.3 mg po q week (3 on/1 off) -- start on 01/24/2020 -- d/c on 03/29/2020 due to toxicity Velcade/Pomalyst/Decadron -Start on 09/02/2018 --Pomalyst on hold since 11/09/2018.  D/C on 01/17/2020 Xgeva 140m Sq q  3 month - next dose in 05/2021  IV Injectafer -- last dose given on 11/2018   Interim History:  Debra Ruiz here today for follow-up.  She is doing pretty well.  She had her 75th birthday last Saturday.  She had a wonderful time.  She is still celebrating her birthday which is wonderful to see.  She does have lower back issues.  This is not from the myeloma.  She has been getting some epidural injections.  She has had no problems with cough.  There is no shortness of breath.  She has had no bleeding.  She has had no rashes.  There is been no leg swelling.  Her last monoclonal studies showed a monoclonal spike of 0.4 g/dL.  Her IgG level was 417 mg/dL.  Her kappa light chain was 1.3 mg/dL.  She has had no change in bowel or bladder habits.  She has had no headache.  Overall, I would say performance status is probably ECOG 1.     Medications:  Allergies as of 05/30/2021       Reactions   Crestor [rosuvastatin Calcium]    Causes liver functions to be elevated        Medication List        Accurate as of May 30, 2021  9:57 AM. If you have any questions, ask your nurse or doctor.          ALPRAZolam 0.25 MG tablet Commonly known as: XANAX Take 1 tablet (0.25 mg total) by mouth 2 (two) times daily as needed. for  anxiety   amLODipine 5 MG tablet Commonly known as: NORVASC TAKE 1 TABLET(5 MG) BY MOUTH DAILY   aspirin 81 MG EC tablet Take 81 mg by mouth every other day.   atorvastatin 20 MG tablet Commonly known as: LIPITOR TAKE 1 TABLET(20 MG) BY MOUTH DAILY   buPROPion 300 MG 24 hr tablet Commonly known as: WELLBUTRIN XL TAKE 1 TABLET(300 MG) BY MOUTH DAILY   dexamethasone 4 MG tablet Commonly known as: DECADRON TAKE 3 TABLETS BY MOUTH 1 TIME A WEEK   escitalopram 20 MG tablet Commonly known as: LEXAPRO TAKE 1 TABLET(20 MG) BY MOUTH DAILY   famciclovir 250 MG tablet Commonly known as: FAMVIR TAKE 1 TABLET(250 MG) BY MOUTH DAILY   IRON PO Take by mouth daily.   LORazepam 0.5 MG tablet Commonly known as: Ativan Take 1 tablet (0.5 mg total) by mouth every 6 (six) hours as needed (Nausea or vomiting).   nebivolol 5 MG tablet Commonly known as: Bystolic TAKE 1 TABLET(5 MG) BY MOUTH DAILY   ondansetron 8 MG tablet Commonly known as: Zofran Take 1 tablet (8 mg total) by mouth 2 (two) times daily as needed (Nausea or vomiting).   potassium chloride SA 20 MEQ tablet Commonly known as: KLOR-CON TAKE 1  TABLET(20 MEQ) BY MOUTH DAILY   prochlorperazine 10 MG tablet Commonly known as: COMPAZINE TAKE 1 TABLET(10 MG) BY MOUTH EVERY 6 HOURS AS NEEDED FOR NAUSEA OR VOMITING   REFRESH DRY EYE THERAPY OP Apply to eye 3 (three) times daily.   temazepam 7.5 MG capsule Commonly known as: RESTORIL TAKE 1 CAPSULE BY MOUTH AT BEDTIME AS NEEDED FOR SLEEP   Vitamin B-12 5000 MCG Tbdp Take by mouth daily.   Vitamin D 50 MCG (2000 UT) Caps Take 2,000 Units by mouth daily.        Allergies:  Allergies  Allergen Reactions   Crestor [Rosuvastatin Calcium]     Causes liver functions to be elevated    Past Medical History, Surgical history, Social history, and Family History were reviewed and updated.  Review of Systems: Review of Systems  Constitutional: Negative.   HENT:  Negative.    Eyes: Negative.   Respiratory: Negative.    Cardiovascular: Negative.   Gastrointestinal: Negative.   Genitourinary: Negative.   Musculoskeletal:  Positive for back pain.  Skin: Negative.   Neurological: Negative.   Endo/Heme/Allergies: Negative.   Psychiatric/Behavioral: Negative.     Physical Exam:  weight is 104 lb 6.4 oz (47.4 kg). Her oral temperature is 98 F (36.7 C). Her blood pressure is 126/69 and her pulse is 82. Her respiration is 20 and oxygen saturation is 99%.   Wt Readings from Last 3 Encounters:  05/30/21 104 lb 6.4 oz (47.4 kg)  05/01/21 106 lb 8 oz (48.3 kg)  04/04/21 104 lb (47.2 kg)    Physical Exam Vitals reviewed.  HENT:     Head: Normocephalic and atraumatic.  Eyes:     Pupils: Pupils are equal, round, and reactive to light.  Cardiovascular:     Rate and Rhythm: Normal rate and regular rhythm.     Heart sounds: Normal heart sounds.  Pulmonary:     Effort: Pulmonary effort is normal.     Breath sounds: Normal breath sounds.  Abdominal:     General: Bowel sounds are normal.     Palpations: Abdomen is soft.  Musculoskeletal:        General: No tenderness or deformity. Normal range of motion.     Cervical back: Normal range of motion.  Lymphadenopathy:     Cervical: No cervical adenopathy.  Skin:    General: Skin is warm and dry.     Findings: No erythema or rash.  Neurological:     Mental Status: She is alert and oriented to person, place, and time.  Psychiatric:        Behavior: Behavior normal.        Thought Content: Thought content normal.        Judgment: Judgment normal.      Lab Results  Component Value Date   WBC 8.3 05/30/2021   HGB 14.0 05/30/2021   HCT 41.1 05/30/2021   MCV 102.5 (H) 05/30/2021   PLT 226 05/30/2021   Lab Results  Component Value Date   FERRITIN 258 10/25/2020   IRON 136 10/25/2020   TIBC 353 10/25/2020   UIBC 216 10/25/2020   IRONPCTSAT 39 10/25/2020   Lab Results  Component Value Date    RBC 4.01 05/30/2021   Lab Results  Component Value Date   KPAFRELGTCHN 13.2 05/01/2021   LAMBDASER <1.5 (L) 05/01/2021   KAPLAMBRATIO >8.80 (H) 05/01/2021   Lab Results  Component Value Date   IGGSERUM 417 (L) 05/01/2021   IGA <5 (L)  05/01/2021   IGMSERUM 12 (L) 05/01/2021   Lab Results  Component Value Date   TOTALPROTELP 6.0 05/01/2021   ALBUMINELP 3.7 05/01/2021   A1GS 0.3 05/01/2021   A2GS 0.7 05/01/2021   BETS 1.0 05/01/2021   GAMS 0.3 (L) 05/01/2021   MSPIKE 0.4 (H) 05/01/2021   SPEI Comment 01/28/2018     Chemistry      Component Value Date/Time   NA 140 05/30/2021 0838   NA 148 (H) 07/16/2017 1128   NA 143 03/05/2017 1051   K 3.7 05/30/2021 0838   K 3.8 07/16/2017 1128   K 3.7 03/05/2017 1051   CL 106 05/30/2021 0838   CL 106 07/16/2017 1128   CO2 23 05/30/2021 0838   CO2 29 07/16/2017 1128   CO2 25 03/05/2017 1051   BUN 32 (H) 05/30/2021 0838   BUN 23 (H) 07/16/2017 1128   BUN 18.2 03/05/2017 1051   CREATININE 0.78 05/30/2021 0838   CREATININE 0.75 06/20/2020 1127   CREATININE 0.8 03/05/2017 1051      Component Value Date/Time   CALCIUM 9.3 05/30/2021 0838   CALCIUM 9.7 07/16/2017 1128   CALCIUM 8.9 03/05/2017 1051   ALKPHOS 60 05/30/2021 0838   ALKPHOS 71 07/16/2017 1128   ALKPHOS 67 03/05/2017 1051   AST 13 (L) 05/30/2021 0838   AST 15 03/05/2017 1051   ALT 17 05/30/2021 0838   ALT 23 07/16/2017 1128   ALT 17 03/05/2017 1051   BILITOT 0.7 05/30/2021 0838   BILITOT 0.55 03/05/2017 1051        Impression and Plan: Ms. Dilger is a very pleasant 75 yo caucasian female with IgG Kappa Myeloma.    Everything seems to be going quite nicely for her myeloma.  I hate that she has a back issues.  I know this is really causing her to have a decreased quality of life.  I am glad that she is celebrating her birthday.  She is 75 years old.  She when sure she would make it to 75 years old.  I think we are on a good schedule with respect to the myeloma.   She is on the daratumumab along with Kyprolis monthly.  I think this works for her schedule.     [name], MD 11/9/20229:57 AM

## 2021-05-31 LAB — KAPPA/LAMBDA LIGHT CHAINS
Kappa free light chain: 9.4 mg/L (ref 3.3–19.4)
Kappa, lambda light chain ratio: >6.27 — ABNORMAL HIGH (ref 0.26–1.65)
Lambda free light chains: <1.5 mg/L — ABNORMAL LOW (ref 5.7–26.3)

## 2021-05-31 LAB — IGG, IGA, IGM
IgA: 5 mg/dL — ABNORMAL LOW (ref 64–422)
IgG (Immunoglobin G), Serum: 422 mg/dL — ABNORMAL LOW (ref 586–1602)
IgM (Immunoglobulin M), Srm: 21 mg/dL — ABNORMAL LOW (ref 26–217)

## 2021-06-01 ENCOUNTER — Encounter: Payer: Self-pay | Admitting: *Deleted

## 2021-06-05 ENCOUNTER — Telehealth: Payer: Self-pay

## 2021-06-05 LAB — PROTEIN ELECTROPHORESIS, SERUM, WITH REFLEX
A/G Ratio: 1.7 (ref 0.7–1.7)
Albumin ELP: 3.8 g/dL (ref 2.9–4.4)
Alpha-1-Globulin: 0.2 g/dL (ref 0.0–0.4)
Alpha-2-Globulin: 0.8 g/dL (ref 0.4–1.0)
Beta Globulin: 1 g/dL (ref 0.7–1.3)
Gamma Globulin: 0.4 g/dL (ref 0.4–1.8)
Globulin, Total: 2.3 g/dL (ref 2.2–3.9)
M-Spike, %: 0.2 g/dL — ABNORMAL HIGH
SPEP Interpretation: 0
Total Protein ELP: 6.1 g/dL (ref 6.0–8.5)

## 2021-06-05 LAB — IMMUNOFIXATION REFLEX, SERUM
IgA: 5 mg/dL — ABNORMAL LOW (ref 64–422)
IgG (Immunoglobin G), Serum: 451 mg/dL — ABNORMAL LOW (ref 586–1602)
IgM (Immunoglobulin M), Srm: 21 mg/dL — ABNORMAL LOW (ref 26–217)

## 2021-06-05 NOTE — Telephone Encounter (Signed)
Responded via MyChart.

## 2021-06-05 NOTE — Telephone Encounter (Signed)
-----   Message from [name], MD sent at 06/05/2021  1:35 PM EST ----- Call - the protein is lower at 0.2!!!!  Great job!!!  Happy Thanksgiving!!!  [name]

## 2021-06-07 ENCOUNTER — Other Ambulatory Visit: Payer: Self-pay | Admitting: Internal Medicine

## 2021-06-11 DIAGNOSIS — M48062 Spinal stenosis, lumbar region with neurogenic claudication: Secondary | ICD-10-CM | POA: Diagnosis not present

## 2021-06-17 ENCOUNTER — Other Ambulatory Visit: Payer: Self-pay | Admitting: Internal Medicine

## 2021-06-17 DIAGNOSIS — C9000 Multiple myeloma not having achieved remission: Secondary | ICD-10-CM

## 2021-06-17 DIAGNOSIS — E782 Mixed hyperlipidemia: Secondary | ICD-10-CM

## 2021-06-18 ENCOUNTER — Telehealth: Payer: Self-pay

## 2021-06-18 ENCOUNTER — Ambulatory Visit (INDEPENDENT_AMBULATORY_CARE_PROVIDER_SITE_OTHER): Payer: PPO | Admitting: Internal Medicine

## 2021-06-18 ENCOUNTER — Encounter: Payer: Self-pay | Admitting: Internal Medicine

## 2021-06-18 VITALS — BP 102/60 | HR 94 | Temp 99.4°F

## 2021-06-18 DIAGNOSIS — R059 Cough, unspecified: Secondary | ICD-10-CM | POA: Diagnosis not present

## 2021-06-18 DIAGNOSIS — C9001 Multiple myeloma in remission: Secondary | ICD-10-CM | POA: Diagnosis not present

## 2021-06-18 DIAGNOSIS — J22 Unspecified acute lower respiratory infection: Secondary | ICD-10-CM | POA: Diagnosis not present

## 2021-06-18 DIAGNOSIS — I10 Essential (primary) hypertension: Secondary | ICD-10-CM

## 2021-06-18 DIAGNOSIS — R0989 Other specified symptoms and signs involving the circulatory and respiratory systems: Secondary | ICD-10-CM | POA: Diagnosis not present

## 2021-06-18 LAB — POCT INFLUENZA A/B
Influenza A, POC: NEGATIVE
Influenza B, POC: NEGATIVE

## 2021-06-18 LAB — POC COVID19 BINAXNOW: SARS Coronavirus 2 Ag: NEGATIVE

## 2021-06-18 MED ORDER — AZITHROMYCIN 250 MG PO TABS: ORAL_TABLET | ORAL | 0 refills | Status: AC

## 2021-06-18 MED ORDER — HYDROCODONE BIT-HOMATROP MBR 5-1.5 MG/5ML PO SOLN: 5.0000 mL | Freq: Three times a day (TID) | ORAL | 0 refills | Status: DC | PRN

## 2021-06-18 MED ORDER — CEFTRIAXONE SODIUM 1 G IJ SOLR
1.0000 g | Freq: Once | INTRAMUSCULAR | Status: AC
Start: 2021-06-18 — End: 2021-06-18
  Administered 2021-06-18: 17:00:00 1 g via INTRAMUSCULAR

## 2021-06-18 NOTE — Telephone Encounter (Signed)
scheduled

## 2021-06-18 NOTE — Telephone Encounter (Signed)
Patient has felt bad for 5 days now. She has a cough and runny nose. She is unable to sleep due to cough. She thinks she had a fever at the beginning but not now. She has a lot of congestion. She wanted an appt. She was advised to take a covid test and call us back with results. She has refused stating that if it came back positive it would make it so she couldn't get her chemo. She will not take a covid test and she will just see [place] next Monday at her scheduled appt.

## 2021-06-19 NOTE — Progress Notes (Signed)
   Subjective:    Patient ID: Debra Ruiz, female    DOB: 1946/07/06, 75 y.o.   MRN: [id number]  HPI 75 year old female with history of IgG kappa myeloma with history of compression fractures and iron deficiency anemia followed by Dr. Marin Olp seen today with respiratory infection symptoms.  She is currently on Kyprolis and daramumab.  Still having some issues with her lower back pain.  She has history of kyphoplasty  T11 and T12 done February 2018 and history of radiation treatment to lower spine completed March 2018.  Initially treated with Xgeva.    She has had an epidural steroid injection in early October.  This only helped for couple of days.  Patient says she has had malaise and fatigue for 5 days, cough, rhinorrhea.  Unable to sleep well due to cough.  Thinks she may have had a fever at the beginning of the illness but currently does not have fever.  We asked her to take a home COVID test and she refused.   I records indicate last COVID-vaccine was in April 2022.  I do not see a current flu vaccine on file.  She refused to take COVID test saying that if it came back positive it would cause her chemotherapy to get delayed.  She initially refused to come to the office but  I called her andI convinced her to come in for visit.    Review of Systems see above     Objective:   Physical Exam Blood pressure 102/60 pulse 94 temperature 99.4 degrees, pulse oximetry 97%  She looks fatigued and she sounds hoarse when she speaks.  Has a congested cough.  TMs are dull bilaterally.  Pharynx slightly injected.  Chest is clear to auscultation.  Rapid influenza test today and they were negative, COVID-19 PCR test is pending, respiratory virus panel is also pending.     Assessment & Plan:  Acute lower respiratory infection  Plan: Zithromax Z-PAK 2 tabs day 1 followed by 1 tab days 2 through 5.  Quarantine at home until test results are back.  Hycodan to take sparingly 1 teaspoon every 8  hours if needed for cough.  Stay well-hydrated.  Given 1 g IM Rocephin in the office.  She has upcoming appointment here including fasting labs.  She may need to defer that appointment since she is ill.

## 2021-06-19 NOTE — Patient Instructions (Signed)
Quarantine at home until test results are back.  Take Zithromax Z-PAK 2 tabs day 1 followed by 1 tab days 2 through 5.  Hycodan syrup to take sparingly 1 teaspoon every 8 hours as needed for cough.  Stay well-hydrated.  Call if symptoms worsen.  Given 1 g IM Rocephin in the office.

## 2021-06-21 ENCOUNTER — Other Ambulatory Visit: Payer: PPO | Admitting: Internal Medicine

## 2021-06-21 DIAGNOSIS — I10 Essential (primary) hypertension: Secondary | ICD-10-CM

## 2021-06-21 DIAGNOSIS — E7800 Pure hypercholesterolemia, unspecified: Secondary | ICD-10-CM

## 2021-06-21 DIAGNOSIS — Z1329 Encounter for screening for other suspected endocrine disorder: Secondary | ICD-10-CM

## 2021-06-21 DIAGNOSIS — Z0000 Encounter for general adult medical examination without abnormal findings: Secondary | ICD-10-CM

## 2021-06-21 LAB — TIQ-NTM

## 2021-06-21 LAB — SARS-COV-2 RNA (COVID-19) RESP VIRAL PNL QL NAAT

## 2021-06-21 LAB — TIQ-MISC

## 2021-06-25 ENCOUNTER — Ambulatory Visit: Payer: PPO

## 2021-06-26 ENCOUNTER — Ambulatory Visit: Payer: PPO

## 2021-06-27 ENCOUNTER — Inpatient Hospital Stay (HOSPITAL_BASED_OUTPATIENT_CLINIC_OR_DEPARTMENT_OTHER): Payer: PPO | Admitting: Hematology & Oncology

## 2021-06-27 ENCOUNTER — Ambulatory Visit: Payer: PPO | Attending: Internal Medicine

## 2021-06-27 ENCOUNTER — Other Ambulatory Visit: Payer: Self-pay

## 2021-06-27 ENCOUNTER — Inpatient Hospital Stay: Payer: PPO

## 2021-06-27 ENCOUNTER — Inpatient Hospital Stay: Payer: PPO | Attending: Hematology & Oncology

## 2021-06-27 VITALS — BP 127/70 | HR 79 | Temp 97.8°F | Resp 20 | Wt 106.0 lb

## 2021-06-27 DIAGNOSIS — C9000 Multiple myeloma not having achieved remission: Secondary | ICD-10-CM

## 2021-06-27 DIAGNOSIS — Z79899 Other long term (current) drug therapy: Secondary | ICD-10-CM | POA: Insufficient documentation

## 2021-06-27 DIAGNOSIS — Z5112 Encounter for antineoplastic immunotherapy: Secondary | ICD-10-CM | POA: Insufficient documentation

## 2021-06-27 DIAGNOSIS — Z23 Encounter for immunization: Secondary | ICD-10-CM

## 2021-06-27 LAB — CMP (CANCER CENTER ONLY)
ALT: 13 U/L (ref 0–44)
AST: 14 U/L — ABNORMAL LOW (ref 15–41)
Albumin: 4.1 g/dL (ref 3.5–5.0)
Alkaline Phosphatase: 63 U/L (ref 38–126)
Anion gap: 7 (ref 5–15)
BUN: 15 mg/dL (ref 8–23)
CO2: 30 mmol/L (ref 22–32)
Calcium: 9.6 mg/dL (ref 8.9–10.3)
Chloride: 108 mmol/L (ref 98–111)
Creatinine: 0.74 mg/dL (ref 0.44–1.00)
GFR, Estimated: >60 mL/min (ref >60)
Glucose, Bld: 119 mg/dL — ABNORMAL HIGH (ref 70–99)
Potassium: 5.1 mmol/L (ref 3.5–5.1)
Sodium: 145 mmol/L (ref 135–145)
Total Bilirubin: 0.6 mg/dL (ref 0.3–1.2)
Total Protein: 6.1 g/dL — ABNORMAL LOW (ref 6.5–8.1)

## 2021-06-27 LAB — CBC WITH DIFFERENTIAL (CANCER CENTER ONLY)
Abs Immature Granulocytes: 0.04 10*3/uL (ref 0.00–0.07)
Basophils Absolute: 0 10*3/uL (ref 0.0–0.1)
Basophils Relative: 0 %
Eosinophils Absolute: 0 10*3/uL (ref 0.0–0.5)
Eosinophils Relative: 1 %
HCT: 38.1 % (ref 36.0–46.0)
Hemoglobin: 12.3 g/dL (ref 12.0–15.0)
Immature Granulocytes: 1 %
Lymphocytes Relative: 18 %
Lymphs Abs: 1.1 10*3/uL (ref 0.7–4.0)
MCH: 34.1 pg — ABNORMAL HIGH (ref 26.0–34.0)
MCHC: 32.3 g/dL (ref 30.0–36.0)
MCV: 105.5 fL — ABNORMAL HIGH (ref 80.0–100.0)
Monocytes Absolute: 0.5 10*3/uL (ref 0.1–1.0)
Monocytes Relative: 9 %
Neutro Abs: 4.4 10*3/uL (ref 1.7–7.7)
Neutrophils Relative %: 71 %
Platelet Count: 310 10*3/uL (ref 150–400)
RBC: 3.61 MIL/uL — ABNORMAL LOW (ref 3.87–5.11)
RDW: 14 % (ref 11.5–15.5)
WBC Count: 6.1 10*3/uL (ref 4.0–10.5)
nRBC: 0 % (ref 0.0–0.2)

## 2021-06-27 LAB — LACTATE DEHYDROGENASE: LDH: 241 U/L — ABNORMAL HIGH (ref 98–192)

## 2021-06-27 MED ORDER — DIPHENHYDRAMINE HCL 25 MG PO CAPS: 25.0000 mg | ORAL_CAPSULE | Freq: Once | ORAL | Status: DC

## 2021-06-27 MED ORDER — SODIUM CHLORIDE 0.9 % IV SOLN: Freq: Once | INTRAVENOUS | Status: DC

## 2021-06-27 MED ORDER — ACETAMINOPHEN 325 MG PO TABS: 650.0000 mg | ORAL_TABLET | Freq: Once | ORAL | Status: DC

## 2021-06-27 MED ORDER — DEXAMETHASONE 4 MG PO TABS: 8.0000 mg | ORAL_TABLET | Freq: Once | ORAL | Status: DC

## 2021-06-27 MED ORDER — DEXTROSE 5 % IV SOLN
70.0000 mg/m2 | Freq: Once | INTRAVENOUS | Status: AC
  Administered 2021-06-27: 100 mg via INTRAVENOUS
  Filled 2021-06-27: qty 30

## 2021-06-27 MED ORDER — SODIUM CHLORIDE 0.9 % IV SOLN
40.0000 mg | Freq: Once | INTRAVENOUS | Status: AC
  Administered 2021-06-27: 40 mg via INTRAVENOUS
  Filled 2021-06-27: qty 4

## 2021-06-27 MED ORDER — DARATUMUMAB-HYALURONIDASE-FIHJ 1800-30000 MG-UT/15ML ~~LOC~~ SOLN
1800.0000 mg | Freq: Once | SUBCUTANEOUS | Status: AC
  Administered 2021-06-27: 1800 mg via SUBCUTANEOUS
  Filled 2021-06-27: qty 15

## 2021-06-27 MED ORDER — SODIUM CHLORIDE 0.9 % IV SOLN: Freq: Once | INTRAVENOUS | Status: AC

## 2021-06-27 NOTE — Patient Instructions (Signed)
[facility]  Discharge Instructions: Thank you for choosing [facility] to provide your oncology and hematology care.   If you have a lab appointment with the [facility], please go directly to the [facility] and check in at the registration area.  Wear comfortable clothing and clothing appropriate for easy access to any Portacath or PICC line.   We strive to give you quality time with your provider. You may need to reschedule your appointment if you arrive late (15 or more minutes).  Arriving late affects you and other patients whose appointments are after yours.  Also, if you miss three or more appointments without notifying the office, you may be dismissed from the clinic at the provider's discretion.      For prescription refill requests, have your pharmacy contact our office and allow 72 hours for refills to be completed.    Today you received the following chemotherapy and/or immunotherapy agents Darzalex, Kyprolis      To help prevent nausea and vomiting after your treatment, we encourage you to take your nausea medication as directed.  BELOW ARE SYMPTOMS THAT SHOULD BE REPORTED IMMEDIATELY: *FEVER GREATER THAN 100.4 F (38 C) OR HIGHER *CHILLS OR SWEATING *NAUSEA AND VOMITING THAT IS NOT CONTROLLED WITH YOUR NAUSEA MEDICATION *UNUSUAL SHORTNESS OF BREATH *UNUSUAL BRUISING OR BLEEDING *URINARY PROBLEMS (pain or burning when urinating, or frequent urination) *BOWEL PROBLEMS (unusual diarrhea, constipation, pain near the anus) TENDERNESS IN MOUTH AND THROAT WITH OR WITHOUT PRESENCE OF ULCERS (sore throat, sores in mouth, or a toothache) UNUSUAL RASH, SWELLING OR PAIN  UNUSUAL VAGINAL DISCHARGE OR ITCHING   Items with * indicate a potential emergency and should be followed up as soon as possible or go to the Emergency Department if any problems should occur.  Please show the CHEMOTHERAPY ALERT CARD or IMMUNOTHERAPY ALERT CARD at check-in  to the Emergency Department and triage nurse. Should you have questions after your visit or need to cancel or reschedule your appointment, please contact [facility]  [phone] and follow the prompts.  Office hours are 8:00 a.m. to 4:30 p.m. Monday - Friday. Please note that voicemails left after 4:00 p.m. may not be returned until the following business day.  We are closed weekends and major holidays. You have access to a nurse at all times for urgent questions. Please call the main number to the clinic [phone] and follow the prompts.  For any non-urgent questions, you may also contact your provider using MyChart. We now offer e-Visits for anyone 23 and older to request care online for non-urgent symptoms. For details visit [email or web address].   Also download the MyChart app! Go to the app store, search "MyChart", open the app, select [facility], and log in with your MyChart username and password.  Due to Covid, a mask is required upon entering the hospital/clinic. If you do not have a mask, one will be given to you upon arrival. For doctor visits, patients may have 1 support person aged 62 or older with them. For treatment visits, patients cannot have anyone with them due to current Covid guidelines and our immunocompromised population.

## 2021-06-27 NOTE — Progress Notes (Signed)
   Covid-19 Vaccination Clinic  Name:  Debra Ruiz    MRN: [id number] DOB: Mar 31, 1946  06/27/2021  [name] was observed post Covid-19 immunization for 15 minutes without incident. She was provided with Vaccine Information Sheet and instruction to access the V-Safe system.   [name] was instructed to call 911 with any severe reactions post vaccine: Difficulty breathing  Swelling of face and throat  A fast heartbeat  A bad rash all over body  Dizziness and weakness   Immunizations Administered     Name Date Dose VIS Date Route   Pfizer Covid-19 Vaccine Bivalent Booster 06/27/2021 11:48 AM 0.3 mL 03/21/2021 Intramuscular   Manufacturer: [place]   Lot: [id number]   [place]: [phone]

## 2021-06-27 NOTE — Progress Notes (Signed)
Hematology and Oncology Follow Up Visit  Debra Ruiz [id number] 08-Jul-1946 75 y.o. 06/27/2021   Principle Diagnosis:  IgG Kappa myeloma - compression fractures - 13q-, +11, 14q+ - hyperdiploid -- progressive Iron def anemia  Completed Therapy: S/p Kyphoplasty of T11 and T12 XRT to lower spine-completed on 09/25/2016  Current Therapy:    Faspro -- start on 04/05/2020 -- s/p cycle #16 -- Kyprolis added on 07/20/2020 -- s/p cycle #13 Ninlaro 2.3 mg po q week (3 on/1 off) -- start on 01/24/2020 -- d/c on 03/29/2020 due to toxicity Velcade/Pomalyst/Decadron -Start on 09/02/2018 --Pomalyst on hold since 11/09/2018.  D/C on 01/17/2020 Xgeva 181m Sq q  3 month - next dose in 08/2021  IV Injectafer -- last dose given on 11/2018   Interim History:  Ms. JNouriis here today for follow-up.  She is under a lot of stress.  She is quite emotional today.  A lot is going on.  She is having back problems.  She will get some epidural steroids I think in a week or so.  I think that the bigger problem is probably her husband who has progressive dementia.  This is where down.  She is trying her best to help him.  I know that this is caused a lot of problems for her.  She cannot do what she would like to do.  However, she understands that he does need to have care.  As far as her myeloma is concerned, she is doing well with this.  Her last monoclonal spike was 0.2 g/dL.  The IgG level was 422 mg/dL.  The Kappa light chain was 0.9 mg/dL.  She has had no fever.  He is no cough.  She has had no change in bowel or bladder habits.  She did have a nice Thanksgiving.  She was home.  [place]  Thankfully, her cat is doing well.  This is given her a lot of comfort.  Overall, her performance status is ECOG 1.    Medications:  Allergies as of 06/27/2021       Reactions   Crestor [rosuvastatin Calcium]    Causes liver  functions to be elevated        Medication List        Accurate as of June 27, 2021  8:58 AM. If you have any questions, ask your nurse or doctor.          STOP taking these medications    HYDROcodone bit-homatropine 5-1.5 MG/5ML syrup Commonly known as: HYCODAN Stopped by: PVolanda Napoleon MD   ondansetron 8 MG tablet Commonly known as: Zofran Stopped by: PVolanda Napoleon MD       TAKE these medications    ALPRAZolam 0.25 MG tablet Commonly known as: XANAX Take 1 tablet (0.25 mg total) by mouth 2 (two) times daily as needed. for anxiety   amLODipine 5 MG tablet Commonly known as: NORVASC TAKE 1 TABLET(5 MG) BY MOUTH DAILY   aspirin 81 MG EC tablet Take 81 mg by mouth every other day.   atorvastatin 20 MG tablet Commonly known as: LIPITOR TAKE 1 TABLET(20 MG) BY MOUTH DAILY   buPROPion 300 MG 24 hr tablet Commonly known as: WELLBUTRIN XL TAKE 1 TABLET(300 MG) BY MOUTH DAILY   dexamethasone 4 MG tablet Commonly known as: DECADRON TAKE 3 TABLETS BY MOUTH 1 TIME A WEEK   escitalopram 20 MG tablet Commonly  known as: LEXAPRO TAKE 1 TABLET(20 MG) BY MOUTH DAILY   famciclovir 250 MG tablet Commonly known as: FAMVIR TAKE 1 TABLET(250 MG) BY MOUTH DAILY   IRON PO Take by mouth daily.   LORazepam 0.5 MG tablet Commonly known as: Ativan Take 1 tablet (0.5 mg total) by mouth every 6 (six) hours as needed (Nausea or vomiting).   nebivolol 5 MG tablet Commonly known as: Bystolic TAKE 1 TABLET(5 MG) BY MOUTH DAILY   potassium chloride SA 20 MEQ tablet Commonly known as: KLOR-CON M TAKE 1 TABLET(20 MEQ) BY MOUTH DAILY   prochlorperazine 10 MG tablet Commonly known as: COMPAZINE TAKE 1 TABLET(10 MG) BY MOUTH EVERY 6 HOURS AS NEEDED FOR NAUSEA OR VOMITING   REFRESH DRY EYE THERAPY OP Apply to eye 3 (three) times daily.   temazepam 7.5 MG capsule Commonly known as: RESTORIL TAKE 1 CAPSULE BY MOUTH AT BEDTIME AS NEEDED FOR SLEEP   Vitamin B-12  5000 MCG Tbdp Take by mouth daily.   Vitamin D 50 MCG (2000 UT) Caps Take 2,000 Units by mouth daily.        Allergies:  Allergies  Allergen Reactions   Crestor [Rosuvastatin Calcium]     Causes liver functions to be elevated    Past Medical History, Surgical history, Social history, and Family History were reviewed and updated.  Review of Systems: Review of Systems  Constitutional: Negative.   HENT: Negative.    Eyes: Negative.   Respiratory: Negative.    Cardiovascular: Negative.   Gastrointestinal: Negative.   Genitourinary: Negative.   Musculoskeletal:  Positive for back pain.  Skin: Negative.   Neurological: Negative.   Endo/Heme/Allergies: Negative.   Psychiatric/Behavioral: Negative.     Physical Exam:  weight is 106 lb (48.1 kg). Her oral temperature is 97.8 F (36.6 C). Her blood pressure is 127/70 and her pulse is 79. Her respiration is 20 and oxygen saturation is 99%.   Wt Readings from Last 3 Encounters:  06/27/21 106 lb (48.1 kg)  05/30/21 104 lb 6.4 oz (47.4 kg)  05/01/21 106 lb 8 oz (48.3 kg)    Physical Exam Vitals reviewed.  HENT:     Head: Normocephalic and atraumatic.  Eyes:     Pupils: Pupils are equal, round, and reactive to light.  Cardiovascular:     Rate and Rhythm: Normal rate and regular rhythm.     Heart sounds: Normal heart sounds.  Pulmonary:     Effort: Pulmonary effort is normal.     Breath sounds: Normal breath sounds.  Abdominal:     General: Bowel sounds are normal.     Palpations: Abdomen is soft.  Musculoskeletal:        General: No tenderness or deformity. Normal range of motion.     Cervical back: Normal range of motion.  Lymphadenopathy:     Cervical: No cervical adenopathy.  Skin:    General: Skin is warm and dry.     Findings: No erythema or rash.  Neurological:     Mental Status: She is alert and oriented to person, place, and time.  Psychiatric:        Behavior: Behavior normal.        Thought Content:  Thought content normal.        Judgment: Judgment normal.      Lab Results  Component Value Date   WBC 6.1 06/27/2021   HGB 12.3 06/27/2021   HCT 38.1 06/27/2021   MCV 105.5 (H) 06/27/2021   PLT  310 06/27/2021   Lab Results  Component Value Date   FERRITIN 258 10/25/2020   IRON 136 10/25/2020   TIBC 353 10/25/2020   UIBC 216 10/25/2020   IRONPCTSAT 39 10/25/2020   Lab Results  Component Value Date   RBC 3.61 (L) 06/27/2021   Lab Results  Component Value Date   KPAFRELGTCHN 9.4 05/30/2021   LAMBDASER <1.5 (L) 05/30/2021   KAPLAMBRATIO >6.27 (H) 05/30/2021   Lab Results  Component Value Date   IGGSERUM 422 (L) 05/30/2021   IGGSERUM 451 (L) 05/30/2021   IGA 5 (L) 05/30/2021   IGA 5 (L) 05/30/2021   IGMSERUM 21 (L) 05/30/2021   IGMSERUM 21 (L) 05/30/2021   Lab Results  Component Value Date   TOTALPROTELP 6.1 05/30/2021   ALBUMINELP 3.8 05/30/2021   A1GS 0.2 05/30/2021   A2GS 0.8 05/30/2021   BETS 1.0 05/30/2021   GAMS 0.4 05/30/2021   MSPIKE 0.2 (H) 05/30/2021   SPEI Comment 01/28/2018     Chemistry      Component Value Date/Time   NA 140 05/30/2021 0838   NA 148 (H) 07/16/2017 1128   NA 143 03/05/2017 1051   K 3.7 05/30/2021 0838   K 3.8 07/16/2017 1128   K 3.7 03/05/2017 1051   CL 106 05/30/2021 0838   CL 106 07/16/2017 1128   CO2 23 05/30/2021 0838   CO2 29 07/16/2017 1128   CO2 25 03/05/2017 1051   BUN 32 (H) 05/30/2021 0838   BUN 23 (H) 07/16/2017 1128   BUN 18.2 03/05/2017 1051   CREATININE 0.78 05/30/2021 0838   CREATININE 0.75 06/20/2020 1127   CREATININE 0.8 03/05/2017 1051      Component Value Date/Time   CALCIUM 9.3 05/30/2021 0838   CALCIUM 9.7 07/16/2017 1128   CALCIUM 8.9 03/05/2017 1051   ALKPHOS 60 05/30/2021 0838   ALKPHOS 71 07/16/2017 1128   ALKPHOS 67 03/05/2017 1051   AST 13 (L) 05/30/2021 0838   AST 15 03/05/2017 1051   ALT 17 05/30/2021 0838   ALT 23 07/16/2017 1128   ALT 17 03/05/2017 1051   BILITOT 0.7 05/30/2021  0838   BILITOT 0.55 03/05/2017 1051        Impression and Plan: Ms. Pankonin is a very pleasant 75 yo caucasian female with IgG Kappa Myeloma.    I just hate that she is having some stress.  I know she is trying her best.  She is very tough.  Hopefully, things will work out for her.  Hopefully going down to [place] for Christmas will help her out.  We will go ahead with treatment today.  From my perspective, I do not think we have to make any changes with the frequency of treatment for right now.  Hopefully, the epidural steroid injections for the back will help her a little bit.  We will now plan to get her back after the Christmas holidays.     [name], MD 12/7/20228:58 AM

## 2021-06-28 ENCOUNTER — Other Ambulatory Visit (HOSPITAL_BASED_OUTPATIENT_CLINIC_OR_DEPARTMENT_OTHER): Payer: Self-pay

## 2021-06-28 ENCOUNTER — Telehealth: Payer: Self-pay | Admitting: Internal Medicine

## 2021-06-28 LAB — KAPPA/LAMBDA LIGHT CHAINS
Kappa free light chain: 14 mg/L (ref 3.3–19.4)
Kappa, lambda light chain ratio: 7.78 — ABNORMAL HIGH (ref 0.26–1.65)
Lambda free light chains: 1.8 mg/L — ABNORMAL LOW (ref 5.7–26.3)

## 2021-06-28 LAB — IGG, IGA, IGM
IgA: 8 mg/dL — ABNORMAL LOW (ref 64–422)
IgG (Immunoglobin G), Serum: 471 mg/dL — ABNORMAL LOW (ref 586–1602)
IgM (Immunoglobulin M), Srm: 10 mg/dL — ABNORMAL LOW (ref 26–217)

## 2021-06-28 MED ORDER — PFIZER COVID-19 VAC BIVALENT 30 MCG/0.3ML IM SUSP
INTRAMUSCULAR | 0 refills | Status: DC
  Filled 2021-06-28: qty 0.3, 1d supply, fill #0

## 2021-06-28 NOTE — Telephone Encounter (Signed)
scheduled

## 2021-06-28 NOTE — Telephone Encounter (Signed)
Debra Ruiz [phone]  [name] called to say she would like to wean off of Wellbutrin or Lexapro and go back on Xanax. What are you thoughts on that. Do you think this could help with everything she is going thru right now.

## 2021-06-29 ENCOUNTER — Telehealth: Payer: PPO | Admitting: Internal Medicine

## 2021-06-29 ENCOUNTER — Other Ambulatory Visit: Payer: Self-pay | Admitting: Internal Medicine

## 2021-06-29 ENCOUNTER — Ambulatory Visit (INDEPENDENT_AMBULATORY_CARE_PROVIDER_SITE_OTHER): Payer: PPO

## 2021-06-29 ENCOUNTER — Other Ambulatory Visit: Payer: Self-pay

## 2021-06-29 DIAGNOSIS — Z0000 Encounter for general adult medical examination without abnormal findings: Secondary | ICD-10-CM

## 2021-06-29 NOTE — Progress Notes (Signed)
I connected with  Debra Ruiz on 06/29/21 by a audio enabled telemedicine application and verified that I am speaking with the correct person using two identifiers.  Patient Location: Home  Provider Location: Office/Clinic  I discussed the limitations of evaluation and management by telemedicine. The patient expressed understanding and agreed to proceed.  Subjective:   Debra Ruiz is a 75 y.o. female who presents for Medicare Annual (Subsequent) preventive examination.  Review of Systems    Defer to PCP Cardiac Risk Factors include: advanced age (>44men, >75 women)     Objective:    Today's Vitals   06/29/21 1420  PainSc: 0-No pain   There is no height or weight on file to calculate BMI.  Advanced Directives 06/29/2021 06/27/2021 05/30/2021 05/01/2021 04/04/2021 03/06/2021 02/05/2021  Does Patient Have a Medical Advance Directive? Yes Yes Yes Yes Yes Yes Yes  Type of Paramedic of [place];Living will Living will;Healthcare Power of [place];Living will [place];Living will Living will;Healthcare Power of Attorney Living will;Healthcare Power of Attorney Living will;Healthcare Power of Attorney  Does patient want to make changes to medical advance directive? No - Patient declined No - Patient declined - No - Patient declined No - Patient declined No - Patient declined -  Copy of [place] in Chart? No - copy requested - No - copy requested No - copy requested - - -  Would patient like information on creating a medical advance directive? - - No - Patient declined No - Patient declined - - -    Current Medications (verified) Outpatient Encounter Medications as of 06/29/2021  Medication Sig   ALPRAZolam (XANAX) 0.25 MG tablet Take 1 tablet (0.25 mg total) by mouth 2 (two) times daily as needed. for anxiety   amLODipine (NORVASC) 5 MG tablet TAKE 1 TABLET(5 MG) BY MOUTH DAILY   aspirin 81 MG  EC tablet Take 81 mg by mouth every other day.   atorvastatin (LIPITOR) 20 MG tablet TAKE 1 TABLET(20 MG) BY MOUTH DAILY   buPROPion (WELLBUTRIN XL) 300 MG 24 hr tablet TAKE 1 TABLET(300 MG) BY MOUTH DAILY   Cholecalciferol (VITAMIN D) 2000 units CAPS Take 2,000 Units by mouth daily.    COVID-19 mRNA bivalent vaccine, Pfizer, (PFIZER COVID-19 VAC BIVALENT) injection Inject into the muscle.   Cyanocobalamin (VITAMIN B-12) 5000 MCG TBDP Take by mouth daily.   dexamethasone (DECADRON) 4 MG tablet TAKE 3 TABLETS BY MOUTH 1 TIME A WEEK   escitalopram (LEXAPRO) 20 MG tablet TAKE 1 TABLET(20 MG) BY MOUTH DAILY   famciclovir (FAMVIR) 250 MG tablet TAKE 1 TABLET(250 MG) BY MOUTH DAILY   Ferrous Sulfate (IRON PO) Take by mouth daily.   Glycerin-Polysorbate 80 (REFRESH DRY EYE THERAPY OP) Apply to eye 3 (three) times daily.   LORazepam (ATIVAN) 0.5 MG tablet Take 1 tablet (0.5 mg total) by mouth every 6 (six) hours as needed (Nausea or vomiting).   nebivolol (BYSTOLIC) 5 MG tablet TAKE 1 TABLET(5 MG) BY MOUTH DAILY   potassium chloride SA (KLOR-CON) 20 MEQ tablet TAKE 1 TABLET(20 MEQ) BY MOUTH DAILY   prochlorperazine (COMPAZINE) 10 MG tablet TAKE 1 TABLET(10 MG) BY MOUTH EVERY 6 HOURS AS NEEDED FOR NAUSEA OR VOMITING   temazepam (RESTORIL) 7.5 MG capsule TAKE 1 CAPSULE BY MOUTH AT BEDTIME AS NEEDED FOR SLEEP   No facility-administered encounter medications on file as of 06/29/2021.    Allergies (verified) Crestor [rosuvastatin calcium]   History:  Past Medical History:  Diagnosis Date   Anemia    Anxiety    Arthritis    Cataracts, bilateral    removed by surgery   Depression    Dry eyes    Goals of care, counseling/discussion 08/28/2016   Hearing loss    bilateral - no hearing aids   History of radiation therapy 09/04/16-09/25/16   spine T11-S3 30 Gy in 15 fractions   Hyperlipidemia    Hypertension    Insomnia    Iron deficiency anemia due to chronic blood loss 12/03/2018   Iron malabsorption  12/03/2018   Multiple myeloma ([place]) 08/22/2016   current chemo treatment   Palpitations    Skin cancer    mohs - surgery   Vaginal dryness    Past Surgical History:  Procedure Laterality Date   EYE SURGERY Bilateral    lasik   EYE SURGERY Bilateral    cataracts removed   FACIAL COSMETIC SURGERY     face lift   KYPHOPLASTY N/A 08/22/2016   Procedure: THORACIC 11, THORACIC 12 KYPHOPLASTY;  Surgeon: Phylliss Bob, MD;  Location: [facility];  Service: Orthopedics;  Laterality: N/A;  THORACIC 11, THORACIC 12 KYPHOPLASTY   POSTERIOR CERVICAL FUSION/FORAMINOTOMY N/A 04/29/2019   Procedure: CERVICAL THREE- CERVICAL SEVEN POSTERIOR CERVICAL DECOMPRESSION FUSION WITH INSTRUMENTATION AND ALLOGRAFT;  Surgeon: Phylliss Bob, MD;  Location: [facility];  Service: Orthopedics;  Laterality: N/A;  CERVICAL THREE- CERVICAL SEVEN POSTERIOR CERVICAL DECOMPRESSION FUSION WITH INSTRUMENTATION AND ALLOGRAFT   WISDOM TOOTH EXTRACTION     Family History  Problem Relation Age of Onset   Hypertension Mother    Stroke Mother    Heart disease Father    Heart disease Brother    Multiple myeloma Cousin    Multiple myeloma Maternal Aunt    Social History   Socioeconomic History   Marital status: Married    Spouse name: Not on file   Number of children: 0   Years of education: Not on file   Highest education level: Not on file  Occupational History   Occupation: insurance  Tobacco Use   Smoking status: Former    Packs/day: 1.00    Years: 12.00    Pack years: 12.00    Types: Cigarettes    Quit date: 02/13/1985    Years since quitting: 36.3   Smokeless tobacco: Never  Vaping Use   Vaping Use: Never used  Substance and Sexual Activity   Alcohol use: Yes    Alcohol/week: 7.0 standard drinks    Types: 7 Glasses of wine per week   Drug use: No   Sexual activity: Not Currently    Birth control/protection: Post-menopausal  Other Topics Concern   Not on file  Social History Narrative   Not on file   Social  Determinants of Health   Financial Resource Strain: Low Risk    Difficulty of Paying Living Expenses: Not hard at all  Food Insecurity: No Food Insecurity   Worried About Charity fundraiser in the Last Year: Never true   Ran Out of Food in the Last Year: Never true  Transportation Needs: No Transportation Needs   Lack of Transportation (Medical): No   Lack of Transportation (Non-Medical): No  Physical Activity: Inactive   Days of Exercise per Week: 0 days   Minutes of Exercise per Session: 0 min  Stress: Stress Concern Present   Feeling of Stress : Rather much  Social Connections: Moderately Isolated   Frequency of Communication with Friends and  Family: More than three times a week   Frequency of Social Gatherings with Friends and Family: More than three times a week   Attends Religious Services: Never   Marine scientist or Organizations: No   Attends Music therapist: Never   Marital Status: Married    Tobacco Counseling Counseling given: No   Clinical Intake:  Pre-visit preparation completed: Yes  Pain : No/denies pain Pain Score: 0-No pain Pain Location: Back     Diabetes: No  How often do you need to have someone help you when you read instructions, pamphlets, or other written materials from your doctor or pharmacy?: 1 - Never What is the last grade level you completed in school?: college  Diabetic?no  Interpreter Needed?: No      Activities of Daily Living In your present state of health, do you have any difficulty performing the following activities: 06/29/2021 07/31/2020  Hearing? N N  Vision? N N  Difficulty concentrating or making decisions? Y N  Walking or climbing stairs? N N  Dressing or bathing? N N  Doing errands, shopping? N -  Preparing Food and eating ? N -  Using the Toilet? N -  In the past six months, have you accidently leaked urine? N -  Do you have problems with loss of bowel control? N -  Managing your  Medications? N -  Managing your Finances? N -  Housekeeping or managing your Housekeeping? N -  Some recent data might be hidden    Patient Care Team: Baxley, Cresenciano Lick, MD as PCP - General (Internal Medicine)  Indicate any recent Medical Services you may have received from other than [facility] providers in the past year (date may be approximate).     Assessment:   This is a routine wellness examination for Debra Ruiz.  Hearing/Vision screen No results found.  Dietary issues and exercise activities discussed: Current Exercise Habits: The patient does not participate in regular exercise at present, Exercise limited by: orthopedic condition(s)   Goals Addressed   None   Depression Screen PHQ 2/9 Scores 06/29/2021 06/22/2020 06/21/2019 09/10/2018 05/12/2018 05/06/2017 09/02/2016  PHQ - 2 Score 0 3 1 0 $R'3 2 1  'iH$ PHQ- 9 Score - 6 - - 13 12 -    Fall Risk Fall Risk  06/22/2020 06/21/2019 05/12/2018 05/06/2017 11/13/2016  Falls in the past year? 0 0 No No Yes  Number falls in past yr: 0 - - - 2 or more  Injury with Fall? 0 - - - Yes  Risk Factor Category  - - - - -  Follow up Falls evaluation completed - - - -    FALL RISK PREVENTION PERTAINING TO THE HOME:  Any stairs in or around the home? Yes  If so, are there any without handrails? No  Home free of loose throw rugs in walkways, pet beds, electrical cords, etc? No  Adequate lighting in your home to reduce risk of falls? Yes   ASSISTIVE DEVICES UTILIZED TO PREVENT FALLS:  Life alert? No  Use of a cane, walker or w/c? No  Grab bars in the bathroom? Yes  Shower chair or bench in shower? No  Elevated toilet seat or a handicapped toilet? No   TIMED UP AND GO:  Was the test performed? No .  Length of time to ambulate 10 feet: n/a sec.     Cognitive Function:     6CIT Screen 06/29/2021  What Year? 0 points  What month? 0 points  What time? 0 points  Count back from 20 0 points  Months in reverse 0 points  Repeat phrase 0 points   Total Score 0    Immunizations Immunization History  Administered Date(s) Administered   Fluad Quad(high Dose 65+) 04/18/2020   HPV Bivalent 11/13/2006   Influenza Inj Mdck Quad Pf 04/21/2019   Influenza, High Dose Seasonal PF 05/23/2018, 04/17/2019   PFIZER Comirnaty(Gray Top)Covid-19 Tri-Sucrose Vaccine 11/08/2020   PFIZER(Purple Top)SARS-COV-2 Vaccination 08/26/2019, 09/16/2019, 04/06/2020   Pfizer Covid-19 Vaccine Bivalent Booster 6yrs & up 06/27/2021   Pneumococcal Conjugate-13 08/05/2017, 03/10/2019   Pneumococcal Polysaccharide-23 05/17/2013   Tdap 07/22/2000, 06/03/2013   Zoster, Live 11/13/2006    TDAP status: Up to date  Flu Vaccine status: Up to date  Pneumococcal vaccine status: Up to date  Covid-19 vaccine status: Completed vaccines  Qualifies for Shingles Vaccine? Yes   Zostavax completed No   Shingrix Completed?: No.    Education has been provided regarding the importance of this vaccine. Patient has been advised to call insurance company to determine out of pocket expense if they have not yet received this vaccine. Advised may also receive vaccine at local pharmacy or Health Dept. Verbalized acceptance and understanding.  Screening Tests Health Maintenance  Topic Date Due   Hepatitis C Screening  Never done   Zoster Vaccines- Shingrix (1 of 2) Never done   MAMMOGRAM  12/22/2021   TETANUS/TDAP  06/04/2023   COLONOSCOPY (Pts 45-49yrs Insurance coverage will need to be confirmed)  11/20/2023   Pneumonia Vaccine 14+ Years old  Completed   INFLUENZA VACCINE  Completed   DEXA SCAN  Completed   COVID-19 Vaccine  Completed   HPV VACCINES  Aged Out    Health Maintenance  Health Maintenance Due  Topic Date Due   Hepatitis C Screening  Never done   Zoster Vaccines- Shingrix (1 of 2) Never done    Colorectal cancer screening: Type of screening: Colonoscopy. Completed 11/19/13. Repeat every 10 years  Mammogram status: Completed 12/22/20. Repeat every  year  Bone Density status: Completed 08/13/19. Results reflect: Bone density results: OSTEOPOROSIS. Repeat every n/a years. Cancer doctor handles that Lung Cancer Screening: (Low Dose CT Chest recommended if Age 81-80 years, 30 pack-year currently smoking OR have quit w/in 15years.) does not qualify.   Lung Cancer Screening Referral: n/a  Additional Screening:  Hepatitis C Screening: does qualify; Completed n/a  Vision Screening: Recommended annual ophthalmology exams for early detection of glaucoma and other disorders of the eye. Is the patient up to date with their annual eye exam?  No  Who is the provider or what is the name of the office in which the patient attends annual eye exams? Dr Noreene Larsson If pt is not established with a provider, would they like to be referred to a provider to establish care?  N/a .   Dental Screening: Recommended annual dental exams for proper oral hygiene  Community Resource Referral / Chronic Care Management: CRR required this visit?  No   CCM required this visit?  No      Plan:     I have personally reviewed and noted the following in the patient's chart:   Medical and social history Use of alcohol, tobacco or illicit drugs  Current medications and supplements including opioid prescriptions.  Functional ability and status Nutritional status Physical activity Advanced directives List of other physicians Hospitalizations, surgeries, and ER visits in previous 12 months Vitals Screenings to include cognitive, depression, and falls Referrals and  appointments  In addition, I have reviewed and discussed with patient certain preventive protocols, quality metrics, and best practice recommendations. A written personalized care plan for preventive services as well as general preventive health recommendations were provided to patient.     Angus Seller, CMA   06/29/2021   Nurse Notes: Non face to face 20 minutes   Debra Ruiz , Thank  you for taking time to come for your Medicare Wellness Visit. I appreciate your ongoing commitment to your health goals. Please review the following plan we discussed and let me know if I can assist you in the future.   These are the goals we discussed:  Goals   None     This is a list of the screening recommended for you and due dates:  Health Maintenance  Topic Date Due   Hepatitis C Screening: USPSTF Recommendation to screen - Ages 78-79 yo.  Never done   Zoster (Shingles) Vaccine (1 of 2) Never done   Mammogram  12/22/2021   Tetanus Vaccine  06/04/2023   Colon Cancer Screening  11/20/2023   Pneumonia Vaccine  Completed   Flu Shot  Completed   DEXA scan (bone density measurement)  Completed   COVID-19 Vaccine  Completed   HPV Vaccine  Aged Out

## 2021-07-02 LAB — PROTEIN ELECTROPHORESIS, SERUM, WITH REFLEX
A/G Ratio: 1.4 (ref 0.7–1.7)
Albumin ELP: 3.4 g/dL (ref 2.9–4.4)
Alpha-1-Globulin: 0.3 g/dL (ref 0.0–0.4)
Alpha-2-Globulin: 0.8 g/dL (ref 0.4–1.0)
Beta Globulin: 1 g/dL (ref 0.7–1.3)
Gamma Globulin: 0.4 g/dL (ref 0.4–1.8)
Globulin, Total: 2.5 g/dL (ref 2.2–3.9)
M-Spike, %: 0.2 g/dL — ABNORMAL HIGH
SPEP Interpretation: 0
Total Protein ELP: 5.9 g/dL — ABNORMAL LOW (ref 6.0–8.5)

## 2021-07-02 LAB — IMMUNOFIXATION REFLEX, SERUM
IgA: 8 mg/dL — ABNORMAL LOW (ref 64–422)
IgG (Immunoglobin G), Serum: 483 mg/dL — ABNORMAL LOW (ref 586–1602)
IgM (Immunoglobulin M), Srm: 11 mg/dL — ABNORMAL LOW (ref 26–217)

## 2021-07-02 NOTE — Progress Notes (Signed)
IElby Showers, MD, have reviewed all documentation for this visit. The documentation on 07/02/21 for the exam, diagnosis, procedures, and orders are all accurate and complete.

## 2021-07-03 ENCOUNTER — Telehealth: Payer: Self-pay

## 2021-07-03 NOTE — Telephone Encounter (Signed)
-----   Message from [name], MD sent at 07/03/2021  7:08 AM EST ----- Call and let her know that everything is holding steady right now.  The myeloma seems to be low and stable.  Debra Ruiz

## 2021-07-03 NOTE — Telephone Encounter (Signed)
Responded via MyChart.

## 2021-07-04 DIAGNOSIS — M48062 Spinal stenosis, lumbar region with neurogenic claudication: Secondary | ICD-10-CM | POA: Diagnosis not present

## 2021-07-10 ENCOUNTER — Other Ambulatory Visit: Payer: Self-pay | Admitting: Hematology & Oncology

## 2021-07-10 DIAGNOSIS — C9000 Multiple myeloma not having achieved remission: Secondary | ICD-10-CM

## 2021-07-10 DIAGNOSIS — R5383 Other fatigue: Secondary | ICD-10-CM

## 2021-07-10 NOTE — Telephone Encounter (Signed)
Refill request received for Temazepam 7.5 #30. Last refilled 04/23/21 for # 30. Last OV 06/27/21 -- next 07/25/21. Please advise if ok to refills, thanks.

## 2021-07-25 ENCOUNTER — Inpatient Hospital Stay: Payer: PPO | Attending: Hematology & Oncology

## 2021-07-25 ENCOUNTER — Encounter: Payer: Self-pay | Admitting: Hematology & Oncology

## 2021-07-25 ENCOUNTER — Other Ambulatory Visit: Payer: Self-pay

## 2021-07-25 ENCOUNTER — Inpatient Hospital Stay (HOSPITAL_BASED_OUTPATIENT_CLINIC_OR_DEPARTMENT_OTHER): Payer: PPO | Admitting: Hematology & Oncology

## 2021-07-25 ENCOUNTER — Inpatient Hospital Stay: Payer: PPO

## 2021-07-25 ENCOUNTER — Telehealth: Payer: Self-pay | Admitting: *Deleted

## 2021-07-25 VITALS — BP 124/70 | HR 84 | Temp 98.2°F | Resp 20 | Wt 106.0 lb

## 2021-07-25 DIAGNOSIS — Z5112 Encounter for antineoplastic immunotherapy: Secondary | ICD-10-CM | POA: Diagnosis not present

## 2021-07-25 DIAGNOSIS — D509 Iron deficiency anemia, unspecified: Secondary | ICD-10-CM | POA: Insufficient documentation

## 2021-07-25 DIAGNOSIS — C9000 Multiple myeloma not having achieved remission: Secondary | ICD-10-CM | POA: Insufficient documentation

## 2021-07-25 DIAGNOSIS — Z79899 Other long term (current) drug therapy: Secondary | ICD-10-CM | POA: Diagnosis not present

## 2021-07-25 LAB — CBC WITH DIFFERENTIAL (CANCER CENTER ONLY)
Abs Immature Granulocytes: 0.02 10*3/uL (ref 0.00–0.07)
Band Neutrophils: 0 %
Basophils Absolute: 0 10*3/uL (ref 0.0–0.1)
Basophils Relative: 0 %
Eosinophils Absolute: 0 10*3/uL (ref 0.0–0.5)
Eosinophils Relative: 0 %
HCT: 38.8 % (ref 36.0–46.0)
Hemoglobin: 13 g/dL (ref 12.0–15.0)
Immature Granulocytes: 0 %
Lymphocytes Relative: 22 %
Lymphs Abs: 1.1 10*3/uL (ref 0.7–4.0)
MCH: 35.1 pg — ABNORMAL HIGH (ref 26.0–34.0)
MCHC: 33.5 g/dL (ref 30.0–36.0)
MCV: 104.9 fL — ABNORMAL HIGH (ref 80.0–100.0)
Monocytes Absolute: 0.4 10*3/uL (ref 0.1–1.0)
Monocytes Relative: 9 %
Neutro Abs: 3.4 10*3/uL (ref 1.7–7.7)
Neutrophils Relative %: 68 %
Platelet Count: 200 10*3/uL (ref 150–400)
RBC: 3.7 MIL/uL — ABNORMAL LOW (ref 3.87–5.11)
RDW: 13.3 % (ref 11.5–15.5)
WBC Count: 4.9 10*3/uL (ref 4.0–10.5)
nRBC: 0 % (ref 0.0–0.2)
nRBC: 0 /100 WBC

## 2021-07-25 LAB — CMP (CANCER CENTER ONLY)
ALT: 14 U/L (ref 0–44)
AST: 14 U/L — ABNORMAL LOW (ref 15–41)
Albumin: 4.2 g/dL (ref 3.5–5.0)
Alkaline Phosphatase: 46 U/L (ref 38–126)
Anion gap: 10 (ref 5–15)
BUN: 17 mg/dL (ref 8–23)
CO2: 22 mmol/L (ref 22–32)
Calcium: 8.9 mg/dL (ref 8.9–10.3)
Chloride: 109 mmol/L (ref 98–111)
Creatinine: 0.79 mg/dL (ref 0.44–1.00)
GFR, Estimated: >60 mL/min (ref >60)
Glucose, Bld: 150 mg/dL — ABNORMAL HIGH (ref 70–99)
Potassium: 3.5 mmol/L (ref 3.5–5.1)
Sodium: 141 mmol/L (ref 135–145)
Total Bilirubin: 0.5 mg/dL (ref 0.3–1.2)
Total Protein: 5.9 g/dL — ABNORMAL LOW (ref 6.5–8.1)

## 2021-07-25 LAB — LACTATE DEHYDROGENASE: LDH: 198 U/L — ABNORMAL HIGH (ref 98–192)

## 2021-07-25 MED ORDER — DARATUMUMAB-HYALURONIDASE-FIHJ 1800-30000 MG-UT/15ML ~~LOC~~ SOLN
1800.0000 mg | Freq: Once | SUBCUTANEOUS | Status: AC
  Administered 2021-07-25: 1800 mg via SUBCUTANEOUS
  Filled 2021-07-25: qty 15

## 2021-07-25 MED ORDER — SODIUM CHLORIDE 0.9 % IV SOLN
40.0000 mg | Freq: Once | INTRAVENOUS | Status: AC
  Administered 2021-07-25: 40 mg via INTRAVENOUS
  Filled 2021-07-25: qty 4

## 2021-07-25 MED ORDER — HEPARIN SOD (PORK) LOCK FLUSH 100 UNIT/ML IV SOLN: 500.0000 [IU] | Freq: Once | INTRAVENOUS | Status: DC | PRN

## 2021-07-25 MED ORDER — SODIUM CHLORIDE 0.9% FLUSH: 10.0000 mL | INTRAVENOUS | Status: DC | PRN

## 2021-07-25 MED ORDER — SODIUM CHLORIDE 0.9 % IV SOLN: Freq: Once | INTRAVENOUS | Status: DC

## 2021-07-25 MED ORDER — SODIUM CHLORIDE 0.9 % IV SOLN: Freq: Once | INTRAVENOUS | Status: AC

## 2021-07-25 MED ORDER — DEXTROSE 5 % IV SOLN
70.0000 mg/m2 | Freq: Once | INTRAVENOUS | Status: AC
  Administered 2021-07-25: 100 mg via INTRAVENOUS
  Filled 2021-07-25: qty 15

## 2021-07-25 NOTE — Telephone Encounter (Signed)
Per 07/25/20 los - gave upcoming appointments - confirmed

## 2021-07-25 NOTE — Patient Instructions (Signed)
[facility]  Discharge Instructions: Thank you for choosing [facility] to provide your oncology and hematology care.   If you have a lab appointment with the [facility], please go directly to the [facility] and check in at the registration area.  Wear comfortable clothing and clothing appropriate for easy access to any Portacath or PICC line.   We strive to give you quality time with your provider. You may need to reschedule your appointment if you arrive late (15 or more minutes).  Arriving late affects you and other patients whose appointments are after yours.  Also, if you miss three or more appointments without notifying the office, you may be dismissed from the clinic at the providers discretion.      For prescription refill requests, have your pharmacy contact our office and allow 72 hours for refills to be completed.    Today you received the following chemotherapy and/or immunotherapy agents Darzalex, Kyprolis       To help prevent nausea and vomiting after your treatment, we encourage you to take your nausea medication as directed.  BELOW ARE SYMPTOMS THAT SHOULD BE REPORTED IMMEDIATELY: *FEVER GREATER THAN 100.4 F (38 C) OR HIGHER *CHILLS OR SWEATING *NAUSEA AND VOMITING THAT IS NOT CONTROLLED WITH YOUR NAUSEA MEDICATION *UNUSUAL SHORTNESS OF BREATH *UNUSUAL BRUISING OR BLEEDING *URINARY PROBLEMS (pain or burning when urinating, or frequent urination) *BOWEL PROBLEMS (unusual diarrhea, constipation, pain near the anus) TENDERNESS IN MOUTH AND THROAT WITH OR WITHOUT PRESENCE OF ULCERS (sore throat, sores in mouth, or a toothache) UNUSUAL RASH, SWELLING OR PAIN  UNUSUAL VAGINAL DISCHARGE OR ITCHING   Items with * indicate a potential emergency and should be followed up as soon as possible or go to the Emergency Department if any problems should occur.  Please show the CHEMOTHERAPY ALERT CARD or IMMUNOTHERAPY ALERT CARD at  check-in to the Emergency Department and triage nurse. Should you have questions after your visit or need to cancel or reschedule your appointment, please contact [facility]  [phone] and follow the prompts.  Office hours are 8:00 a.m. to 4:30 p.m. Monday - Friday. Please note that voicemails left after 4:00 p.m. may not be returned until the following business day.  We are closed weekends and major holidays. You have access to a nurse at all times for urgent questions. Please call the main number to the clinic [phone] and follow the prompts.  For any non-urgent questions, you may also contact your provider using MyChart. We now offer e-Visits for anyone 46 and older to request care online for non-urgent symptoms. For details visit [email or web address].   Also download the MyChart app! Go to the app store, search "MyChart", open the app, select [facility], and log in with your MyChart username and password.  Due to Covid, a mask is required upon entering the hospital/clinic. If you do not have a mask, one will be given to you upon arrival. For doctor visits, patients may have 1 support person aged 60 or older with them. For treatment visits, patients cannot have anyone with them due to current Covid guidelines and our immunocompromised population.

## 2021-07-25 NOTE — Progress Notes (Signed)
Hematology and Oncology Follow Up Visit  Debra Ruiz [id number] 1946/01/28 76 y.o. 07/25/2021   Principle Diagnosis:  IgG Kappa myeloma - compression fractures - 13q-, +11, 14q+ - hyperdiploid -- progressive Iron def anemia  Completed Therapy: S/p Kyphoplasty of T11 and T12 XRT to lower spine-completed on 09/25/2016  Current Therapy:    Faspro -- start on 04/05/2020 -- s/p cycle #16 -- Kyprolis added on 07/20/2020 -- s/p cycle #17 Ninlaro 2.3 mg po q week (3 on/1 off) -- start on 01/24/2020 -- d/c on 03/29/2020 due to toxicity Velcade/Pomalyst/Decadron -Start on 09/02/2018 --Pomalyst on hold since 11/09/2018.  D/C on 01/17/2020 Xgeva 143m Sq q  3 month - next dose in 08/2021  IV Injectafer -- last dose given on 11/2018   Interim History:  Debra Ruiz here today for follow-up.  She made it through the holidays.  It was pretty quiet for she and her husband.  [place].  Her husband still having problems with his cognitive abilities.  I think he goes to the doctor tomorrow.  She sees the orthopedist about her back.  Hopefully, she will have some relief from the pain.  She and I suspect she will be getting some epidural injections.  Her last myeloma studies still look pretty stable.  Her monoclonal spike was 0.2 g/dL.  The IgG level was 477 mg/dL.  The Kappa light chain was 1.4 mg/dL.  She has had no problems with bowels or bladder.  She has had no bleeding.  She has had no fever.  She has had no cough or shortness of breath.  There is been no rashes.  She has had no leg swelling.  Overall, her performance status is ECOG 1.    Medications:  Allergies as of 07/25/2021       Reactions   Crestor [rosuvastatin Calcium]    Causes liver functions to be elevated        Medication List        Accurate as of July 25, 2021  9:30 AM. If you have any questions, ask your nurse or doctor.          ALPRAZolam 0.25 MG  tablet Commonly known as: XANAX Take 1 tablet (0.25 mg total) by mouth 2 (two) times daily as needed. for anxiety   amLODipine 5 MG tablet Commonly known as: NORVASC TAKE 1 TABLET(5 MG) BY MOUTH DAILY   aspirin 81 MG EC tablet Take 81 mg by mouth every other day.   atorvastatin 20 MG tablet Commonly known as: LIPITOR TAKE 1 TABLET(20 MG) BY MOUTH DAILY   buPROPion 300 MG 24 hr tablet Commonly known as: WELLBUTRIN XL TAKE 1 TABLET(300 MG) BY MOUTH DAILY   dexamethasone 4 MG tablet Commonly known as: DECADRON TAKE 3 TABLETS BY MOUTH 1 TIME A WEEK   escitalopram 20 MG tablet Commonly known as: LEXAPRO TAKE 1 TABLET(20 MG) BY MOUTH DAILY   famciclovir 250 MG tablet Commonly known as: FAMVIR TAKE 1 TABLET(250 MG) BY MOUTH DAILY   IRON PO Take by mouth daily.   LORazepam 0.5 MG tablet Commonly known as: Ativan Take 1 tablet (0.5 mg total) by mouth every 6 (six) hours as needed (Nausea or vomiting).   nebivolol 5 MG tablet Commonly known as: Bystolic TAKE 1 TABLET(5 MG) BY MOUTH DAILY   Pfizer COVID-19 Vac Bivalent injection Generic drug: COVID-19 mRNA bivalent vaccine (Pfizer) Inject into the muscle.   potassium chloride SA  20 MEQ tablet Commonly known as: KLOR-CON M TAKE 1 TABLET(20 MEQ) BY MOUTH DAILY   prochlorperazine 10 MG tablet Commonly known as: COMPAZINE TAKE 1 TABLET(10 MG) BY MOUTH EVERY 6 HOURS AS NEEDED FOR NAUSEA OR VOMITING   REFRESH DRY EYE THERAPY OP Apply to eye 3 (three) times daily.   temazepam 7.5 MG capsule Commonly known as: RESTORIL TAKE 1 CAPSULE BY MOUTH AT BEDTIME AS NEEDED FOR SLEEP   Vitamin B-12 5000 MCG Tbdp Take by mouth daily.   Vitamin D 50 MCG (2000 UT) Caps Take 2,000 Units by mouth daily.        Allergies:  Allergies  Allergen Reactions   Crestor [Rosuvastatin Calcium]     Causes liver functions to be elevated    Past Medical History, Surgical history, Social history, and Family History were reviewed and  updated.  Review of Systems: Review of Systems  Constitutional: Negative.   HENT: Negative.    Eyes: Negative.   Respiratory: Negative.    Cardiovascular: Negative.   Gastrointestinal: Negative.   Genitourinary: Negative.   Musculoskeletal:  Positive for back pain.  Skin: Negative.   Neurological: Negative.   Endo/Heme/Allergies: Negative.   Psychiatric/Behavioral: Negative.     Physical Exam:  weight is 106 lb (48.1 kg). Her oral temperature is 98.2 F (36.8 C). Her blood pressure is 124/70 and her pulse is 84. Her respiration is 20 and oxygen saturation is 100%.   Wt Readings from Last 3 Encounters:  07/25/21 106 lb (48.1 kg)  06/27/21 106 lb (48.1 kg)  05/30/21 104 lb 6.4 oz (47.4 kg)    Physical Exam Vitals reviewed.  HENT:     Head: Normocephalic and atraumatic.  Eyes:     Pupils: Pupils are equal, round, and reactive to light.  Cardiovascular:     Rate and Rhythm: Normal rate and regular rhythm.     Heart sounds: Normal heart sounds.  Pulmonary:     Effort: Pulmonary effort is normal.     Breath sounds: Normal breath sounds.  Abdominal:     General: Bowel sounds are normal.     Palpations: Abdomen is soft.  Musculoskeletal:        General: No tenderness or deformity. Normal range of motion.     Cervical back: Normal range of motion.  Lymphadenopathy:     Cervical: No cervical adenopathy.  Skin:    General: Skin is warm and dry.     Findings: No erythema or rash.  Neurological:     Mental Status: She is alert and oriented to person, place, and time.  Psychiatric:        Behavior: Behavior normal.        Thought Content: Thought content normal.        Judgment: Judgment normal.      Lab Results  Component Value Date   WBC 4.9 07/25/2021   HGB 13.0 07/25/2021   HCT 38.8 07/25/2021   MCV 104.9 (H) 07/25/2021   PLT 200 07/25/2021   Lab Results  Component Value Date   FERRITIN 258 10/25/2020   IRON 136 10/25/2020   TIBC 353 10/25/2020   UIBC 216  10/25/2020   IRONPCTSAT 39 10/25/2020   Lab Results  Component Value Date   RBC 3.70 (L) 07/25/2021   Lab Results  Component Value Date   KPAFRELGTCHN 14.0 06/27/2021   LAMBDASER 1.8 (L) 06/27/2021   KAPLAMBRATIO 7.78 (H) 06/27/2021   Lab Results  Component Value Date   IGGSERUM 471 (  L) 06/27/2021   IGGSERUM 483 (L) 06/27/2021   IGA 8 (L) 06/27/2021   IGA 8 (L) 06/27/2021   IGMSERUM 10 (L) 06/27/2021   IGMSERUM 11 (L) 06/27/2021   Lab Results  Component Value Date   TOTALPROTELP 5.9 (L) 06/27/2021   ALBUMINELP 3.4 06/27/2021   A1GS 0.3 06/27/2021   A2GS 0.8 06/27/2021   BETS 1.0 06/27/2021   GAMS 0.4 06/27/2021   MSPIKE 0.2 (H) 06/27/2021   SPEI Comment 01/28/2018     Chemistry      Component Value Date/Time   NA 145 06/27/2021 0824   NA 148 (H) 07/16/2017 1128   NA 143 03/05/2017 1051   K 5.1 06/27/2021 0824   K 3.8 07/16/2017 1128   K 3.7 03/05/2017 1051   CL 108 06/27/2021 0824   CL 106 07/16/2017 1128   CO2 30 06/27/2021 0824   CO2 29 07/16/2017 1128   CO2 25 03/05/2017 1051   BUN 15 06/27/2021 0824   BUN 23 (H) 07/16/2017 1128   BUN 18.2 03/05/2017 1051   CREATININE 0.74 06/27/2021 0824   CREATININE 0.75 06/20/2020 1127   CREATININE 0.8 03/05/2017 1051      Component Value Date/Time   CALCIUM 9.6 06/27/2021 0824   CALCIUM 9.7 07/16/2017 1128   CALCIUM 8.9 03/05/2017 1051   ALKPHOS 63 06/27/2021 0824   ALKPHOS 71 07/16/2017 1128   ALKPHOS 67 03/05/2017 1051   AST 14 (L) 06/27/2021 0824   AST 15 03/05/2017 1051   ALT 13 06/27/2021 0824   ALT 23 07/16/2017 1128   ALT 17 03/05/2017 1051   BILITOT 0.6 06/27/2021 0824   BILITOT 0.55 03/05/2017 1051        Impression and Plan: Ms. Rena is a very pleasant 76 yo caucasian female with IgG Kappa Myeloma.   Overall, I think she is doing pretty well.  She is holding steady with her myeloma values.  We will have to check out the numbers this visit.  Hopefully, her back can get improved.  I know this  is causing her most of her problems.  We will still follow her up monthly.  I know this works for her schedule.    [name], MD 1/4/20239:30 AM

## 2021-07-26 ENCOUNTER — Encounter: Payer: Self-pay | Admitting: *Deleted

## 2021-07-26 ENCOUNTER — Telehealth: Payer: Self-pay | Admitting: *Deleted

## 2021-07-26 LAB — KAPPA/LAMBDA LIGHT CHAINS
Kappa free light chain: 16.6 mg/L (ref 3.3–19.4)
Kappa, lambda light chain ratio: 9.22 — ABNORMAL HIGH (ref 0.26–1.65)
Lambda free light chains: 1.8 mg/L — ABNORMAL LOW (ref 5.7–26.3)

## 2021-07-26 LAB — IGG, IGA, IGM
IgA: <5 mg/dL — ABNORMAL LOW (ref 64–422)
IgG (Immunoglobin G), Serum: 436 mg/dL — ABNORMAL LOW (ref 586–1602)
IgM (Immunoglobulin M), Srm: 6 mg/dL — ABNORMAL LOW (ref 26–217)

## 2021-07-26 NOTE — Telephone Encounter (Signed)
Message left to notify pt that a Mychart message would be sent regarding Dr. Antonieta Pert message and to call office back with any questions.

## 2021-07-26 NOTE — Telephone Encounter (Signed)
-----   Message from Debra Napoleon, MD sent at 07/26/2021  3:51 PM EST ----- Call - the light chains are stable!!  Debra Ruiz

## 2021-07-30 ENCOUNTER — Telehealth: Payer: Self-pay | Admitting: *Deleted

## 2021-07-30 LAB — PROTEIN ELECTROPHORESIS, SERUM, WITH REFLEX
A/G Ratio: 1.8 — ABNORMAL HIGH (ref 0.7–1.7)
Albumin ELP: 3.7 g/dL (ref 2.9–4.4)
Alpha-1-Globulin: 0.2 g/dL (ref 0.0–0.4)
Alpha-2-Globulin: 0.7 g/dL (ref 0.4–1.0)
Beta Globulin: 0.9 g/dL (ref 0.7–1.3)
Gamma Globulin: 0.4 g/dL (ref 0.4–1.8)
Globulin, Total: 2.1 g/dL — ABNORMAL LOW (ref 2.2–3.9)
M-Spike, %: 0.3 g/dL — ABNORMAL HIGH
SPEP Interpretation: 0
Total Protein ELP: 5.8 g/dL — ABNORMAL LOW (ref 6.0–8.5)

## 2021-07-30 LAB — IMMUNOFIXATION REFLEX, SERUM
IgA: <5 mg/dL — ABNORMAL LOW (ref 64–422)
IgG (Immunoglobin G), Serum: 505 mg/dL — ABNORMAL LOW (ref 586–1602)
IgM (Immunoglobulin M), Srm: 7 mg/dL — ABNORMAL LOW (ref 26–217)

## 2021-07-30 NOTE — Telephone Encounter (Signed)
As noted below by Dr. Marin Olp, I informed her that the myeloma is still at a very low level. She verbalized understanding.

## 2021-07-30 NOTE — Telephone Encounter (Signed)
-----   Message from Debra Napoleon, MD sent at 07/30/2021  1:41 PM EST ----- Please call and let her know that the myeloma is still at a very low level.  Thanks much.  [name]

## 2021-08-03 ENCOUNTER — Other Ambulatory Visit: Payer: Self-pay | Admitting: Hematology & Oncology

## 2021-08-03 DIAGNOSIS — C9000 Multiple myeloma not having achieved remission: Secondary | ICD-10-CM

## 2021-08-10 DIAGNOSIS — M48061 Spinal stenosis, lumbar region without neurogenic claudication: Secondary | ICD-10-CM | POA: Diagnosis not present

## 2021-08-17 ENCOUNTER — Encounter: Payer: Self-pay | Admitting: *Deleted

## 2021-08-22 ENCOUNTER — Inpatient Hospital Stay: Payer: PPO

## 2021-08-22 ENCOUNTER — Other Ambulatory Visit: Payer: Self-pay

## 2021-08-22 ENCOUNTER — Encounter: Payer: Self-pay | Admitting: Hematology & Oncology

## 2021-08-22 ENCOUNTER — Inpatient Hospital Stay (HOSPITAL_BASED_OUTPATIENT_CLINIC_OR_DEPARTMENT_OTHER): Payer: PPO | Admitting: Hematology & Oncology

## 2021-08-22 ENCOUNTER — Inpatient Hospital Stay: Payer: PPO | Attending: Hematology & Oncology

## 2021-08-22 VITALS — BP 128/73 | HR 81 | Temp 97.5°F | Resp 20 | Ht 59.84 in | Wt 108.0 lb

## 2021-08-22 DIAGNOSIS — C9000 Multiple myeloma not having achieved remission: Secondary | ICD-10-CM

## 2021-08-22 DIAGNOSIS — Z79899 Other long term (current) drug therapy: Secondary | ICD-10-CM | POA: Insufficient documentation

## 2021-08-22 DIAGNOSIS — Z5112 Encounter for antineoplastic immunotherapy: Secondary | ICD-10-CM | POA: Insufficient documentation

## 2021-08-22 DIAGNOSIS — D509 Iron deficiency anemia, unspecified: Secondary | ICD-10-CM | POA: Diagnosis not present

## 2021-08-22 LAB — CBC WITH DIFFERENTIAL (CANCER CENTER ONLY)
Abs Immature Granulocytes: 0.06 10*3/uL (ref 0.00–0.07)
Basophils Absolute: 0 10*3/uL (ref 0.0–0.1)
Basophils Relative: 0 %
Eosinophils Absolute: 0 10*3/uL (ref 0.0–0.5)
Eosinophils Relative: 0 %
HCT: 39.3 % (ref 36.0–46.0)
Hemoglobin: 13.6 g/dL (ref 12.0–15.0)
Immature Granulocytes: 1 %
Lymphocytes Relative: 15 %
Lymphs Abs: 1.4 10*3/uL (ref 0.7–4.0)
MCH: 35.2 pg — ABNORMAL HIGH (ref 26.0–34.0)
MCHC: 34.6 g/dL (ref 30.0–36.0)
MCV: 101.8 fL — ABNORMAL HIGH (ref 80.0–100.0)
Monocytes Absolute: 1.2 10*3/uL — ABNORMAL HIGH (ref 0.1–1.0)
Monocytes Relative: 13 %
Neutro Abs: 6.5 10*3/uL (ref 1.7–7.7)
Neutrophils Relative %: 71 %
Platelet Count: 222 10*3/uL (ref 150–400)
RBC: 3.86 MIL/uL — ABNORMAL LOW (ref 3.87–5.11)
RDW: 12.5 % (ref 11.5–15.5)
WBC Count: 9.1 10*3/uL (ref 4.0–10.5)
nRBC: 0 % (ref 0.0–0.2)

## 2021-08-22 LAB — LACTATE DEHYDROGENASE: LDH: 198 U/L — ABNORMAL HIGH (ref 98–192)

## 2021-08-22 LAB — CMP (CANCER CENTER ONLY)
ALT: 14 U/L (ref 0–44)
AST: 14 U/L — ABNORMAL LOW (ref 15–41)
Albumin: 4.4 g/dL (ref 3.5–5.0)
Alkaline Phosphatase: 53 U/L (ref 38–126)
Anion gap: 10 (ref 5–15)
BUN: 23 mg/dL (ref 8–23)
CO2: 24 mmol/L (ref 22–32)
Calcium: 9.8 mg/dL (ref 8.9–10.3)
Chloride: 106 mmol/L (ref 98–111)
Creatinine: 0.72 mg/dL (ref 0.44–1.00)
GFR, Estimated: >60 mL/min (ref >60)
Glucose, Bld: 86 mg/dL (ref 70–99)
Potassium: 4.6 mmol/L (ref 3.5–5.1)
Sodium: 140 mmol/L (ref 135–145)
Total Bilirubin: 0.5 mg/dL (ref 0.3–1.2)
Total Protein: 6.3 g/dL — ABNORMAL LOW (ref 6.5–8.1)

## 2021-08-22 MED ORDER — SODIUM CHLORIDE 0.9 % IV SOLN: Freq: Once | INTRAVENOUS | Status: DC

## 2021-08-22 MED ORDER — SODIUM CHLORIDE 0.9 % IV SOLN
40.0000 mg | Freq: Once | INTRAVENOUS | Status: AC
  Administered 2021-08-22: 40 mg via INTRAVENOUS
  Filled 2021-08-22: qty 4

## 2021-08-22 MED ORDER — DIPHENHYDRAMINE HCL 25 MG PO CAPS: 25.0000 mg | ORAL_CAPSULE | Freq: Once | ORAL | Status: DC

## 2021-08-22 MED ORDER — ACETAMINOPHEN 325 MG PO TABS: 650.0000 mg | ORAL_TABLET | Freq: Once | ORAL | Status: DC

## 2021-08-22 MED ORDER — DENOSUMAB 120 MG/1.7ML ~~LOC~~ SOLN
120.0000 mg | Freq: Once | SUBCUTANEOUS | Status: AC
  Administered 2021-08-22: 120 mg via SUBCUTANEOUS
  Filled 2021-08-22: qty 1.7

## 2021-08-22 MED ORDER — DEXAMETHASONE 4 MG PO TABS: 8.0000 mg | ORAL_TABLET | Freq: Once | ORAL | Status: DC

## 2021-08-22 MED ORDER — SODIUM CHLORIDE 0.9 % IV SOLN: Freq: Once | INTRAVENOUS | Status: AC

## 2021-08-22 MED ORDER — DEXTROSE 5 % IV SOLN
70.0000 mg/m2 | Freq: Once | INTRAVENOUS | Status: AC
  Administered 2021-08-22: 100 mg via INTRAVENOUS
  Filled 2021-08-22: qty 30

## 2021-08-22 MED ORDER — DARATUMUMAB-HYALURONIDASE-FIHJ 1800-30000 MG-UT/15ML ~~LOC~~ SOLN
1800.0000 mg | Freq: Once | SUBCUTANEOUS | Status: AC
  Administered 2021-08-22: 1800 mg via SUBCUTANEOUS
  Filled 2021-08-22: qty 15

## 2021-08-22 NOTE — Patient Instructions (Signed)
[facility]  Discharge Instructions: Thank you for choosing [facility] to provide your oncology and hematology care.   If you have a lab appointment with the [facility], please go directly to the [facility] and check in at the registration area.  Wear comfortable clothing and clothing appropriate for easy access to any Portacath or PICC line.   We strive to give you quality time with your provider. You may need to reschedule your appointment if you arrive late (15 or more minutes).  Arriving late affects you and other patients whose appointments are after yours.  Also, if you miss three or more appointments without notifying the office, you may be dismissed from the clinic at the providers discretion.      For prescription refill requests, have your pharmacy contact our office and allow 72 hours for refills to be completed.    Today you received the following chemotherapy and/or immunotherapy agents Kyprolis, Darzalex      To help prevent nausea and vomiting after your treatment, we encourage you to take your nausea medication as directed.  BELOW ARE SYMPTOMS THAT SHOULD BE REPORTED IMMEDIATELY: *FEVER GREATER THAN 100.4 F (38 C) OR HIGHER *CHILLS OR SWEATING *NAUSEA AND VOMITING THAT IS NOT CONTROLLED WITH YOUR NAUSEA MEDICATION *UNUSUAL SHORTNESS OF BREATH *UNUSUAL BRUISING OR BLEEDING *URINARY PROBLEMS (pain or burning when urinating, or frequent urination) *BOWEL PROBLEMS (unusual diarrhea, constipation, pain near the anus) TENDERNESS IN MOUTH AND THROAT WITH OR WITHOUT PRESENCE OF ULCERS (sore throat, sores in mouth, or a toothache) UNUSUAL RASH, SWELLING OR PAIN  UNUSUAL VAGINAL DISCHARGE OR ITCHING   Items with * indicate a potential emergency and should be followed up as soon as possible or go to the Emergency Department if any problems should occur.  Please show the CHEMOTHERAPY ALERT CARD or IMMUNOTHERAPY ALERT CARD at check-in  to the Emergency Department and triage nurse. Should you have questions after your visit or need to cancel or reschedule your appointment, please contact [facility]  [phone] and follow the prompts.  Office hours are 8:00 a.m. to 4:30 p.m. Monday - Friday. Please note that voicemails left after 4:00 p.m. may not be returned until the following business day.  We are closed weekends and major holidays. You have access to a nurse at all times for urgent questions. Please call the main number to the clinic [phone] and follow the prompts.  For any non-urgent questions, you may also contact your provider using MyChart. We now offer e-Visits for anyone 42 and older to request care online for non-urgent symptoms. For details visit [email or web address].   Also download the MyChart app! Go to the app store, search "MyChart", open the app, select [facility], and log in with your MyChart username and password.  Due to Covid, a mask is required upon entering the hospital/clinic. If you do not have a mask, one will be given to you upon arrival. For doctor visits, patients may have 1 support person aged 109 or older with them. For treatment visits, patients cannot have anyone with them due to current Covid guidelines and our immunocompromised population.

## 2021-08-22 NOTE — Progress Notes (Signed)
Hematology and Oncology Follow Up Visit  Debra Ruiz [id number] 1945-10-15 76 y.o. 08/22/2021   Principle Diagnosis:  IgG Kappa myeloma - compression fractures - 13q-, +11, 14q+ - hyperdiploid -- progressive Iron def anemia  Completed Therapy: S/p Kyphoplasty of T11 and T12 XRT to lower spine-completed on 09/25/2016  Current Therapy:    Faspro -- start on 04/05/2020 -- s/p cycle #16 -- Kyprolis added on 07/20/2020 -- s/p cycle #17 Ninlaro 2.3 mg po q week (3 on/1 off) -- start on 01/24/2020 -- d/c on 03/29/2020 due to toxicity Velcade/Pomalyst/Decadron -Start on 09/02/2018 --Pomalyst on hold since 11/09/2018.  D/C on 01/17/2020 Xgeva $RemoveBeforeDE'120mg'keCZnofOymHRTmr$  Sq q  3 month - next dose in 11/2021  IV Injectafer -- last dose given on 11/2018   Interim History:  Ms. Purington is here today for follow-up.  She is having a lot of problems with her back.  This has been a chronic issue for her.  She sees Dr. Lynann Bologna of Orthopedic Surgery.  He is trying to avoid having to do surgery on her.  Her husband has Alzheimer's.  This is been a stress for her also.  Her myeloma has been holding relatively stable.  Her last monoclonal spike was 0.3 g/dL.  The IgG level was 488 mg/dL.  The Kappa light chain was 1.6 mg/dL.  Her appetite has been pretty good.  She has had no nausea or vomiting.  She has had no change in bowel or bladder habits.  She has had no rashes.  There is been no leg swelling.  Overall, I would say performance status is ECOG 1.    Medications:  Allergies as of 08/22/2021       Reactions   Crestor [rosuvastatin Calcium] Other (See Comments)   Causes liver functions to be elevated        Medication List        Accurate as of August 22, 2021 10:56 AM. If you have any questions, ask your nurse or doctor.          STOP taking these medications    Pfizer COVID-19 Vac Bivalent injection Generic drug: COVID-19 mRNA bivalent vaccine Therapist, music) Stopped by: [name], MD       TAKE  these medications    ALPRAZolam 0.25 MG tablet Commonly known as: XANAX Take 1 tablet (0.25 mg total) by mouth 2 (two) times daily as needed. for anxiety   amLODipine 5 MG tablet Commonly known as: NORVASC TAKE 1 TABLET(5 MG) BY MOUTH DAILY   aspirin 81 MG EC tablet Take 81 mg by mouth every other day.   atorvastatin 20 MG tablet Commonly known as: LIPITOR TAKE 1 TABLET(20 MG) BY MOUTH DAILY   buPROPion 300 MG 24 hr tablet Commonly known as: WELLBUTRIN XL TAKE 1 TABLET(300 MG) BY MOUTH DAILY   dexamethasone 4 MG tablet Commonly known as: DECADRON TAKE 3 TABLETS BY MOUTH 1 TIME A WEEK   escitalopram 20 MG tablet Commonly known as: LEXAPRO TAKE 1 TABLET(20 MG) BY MOUTH DAILY   famciclovir 250 MG tablet Commonly known as: FAMVIR TAKE 1 TABLET(250 MG) BY MOUTH DAILY   gabapentin 300 MG capsule Commonly known as: NEURONTIN Take 300 mg by mouth at bedtime.   IRON PO Take by mouth daily.   LORazepam 0.5 MG tablet Commonly known as: Ativan Take 1 tablet (0.5 mg total) by mouth every 6 (six) hours as needed (Nausea or vomiting).   nebivolol 5 MG tablet Commonly known as: Bystolic TAKE 1 TABLET(5  MG) BY MOUTH DAILY   potassium chloride SA 20 MEQ tablet Commonly known as: KLOR-CON M TAKE 1 TABLET(20 MEQ) BY MOUTH DAILY   prochlorperazine 10 MG tablet Commonly known as: COMPAZINE TAKE 1 TABLET(10 MG) BY MOUTH EVERY 6 HOURS AS NEEDED FOR NAUSEA OR VOMITING   REFRESH DRY EYE THERAPY OP Apply to eye 3 (three) times daily.   temazepam 7.5 MG capsule Commonly known as: RESTORIL TAKE 1 CAPSULE BY MOUTH AT BEDTIME AS NEEDED FOR SLEEP   Vitamin B-12 5000 MCG Tbdp Take by mouth daily.   Vitamin D 50 MCG (2000 UT) Caps Take 2,000 Units by mouth daily.        Allergies:  Allergies  Allergen Reactions   Crestor [Rosuvastatin Calcium] Other (See Comments)    Causes liver functions to be elevated    Past Medical History, Surgical history, Social history, and  Family History were reviewed and updated.  Review of Systems: Review of Systems  Constitutional: Negative.   HENT: Negative.    Eyes: Negative.   Respiratory: Negative.    Cardiovascular: Negative.   Gastrointestinal: Negative.   Genitourinary: Negative.   Musculoskeletal:  Positive for back pain.  Skin: Negative.   Neurological: Negative.   Endo/Heme/Allergies: Negative.   Psychiatric/Behavioral: Negative.     Physical Exam:  height is 4' 11.84" (1.52 m) and weight is 108 lb (49 kg). Her oral temperature is 97.5 F (36.4 C) (abnormal). Her blood pressure is 128/73 and her pulse is 81. Her respiration is 20 and oxygen saturation is 100%.   Wt Readings from Last 3 Encounters:  08/22/21 108 lb (49 kg)  07/25/21 106 lb (48.1 kg)  06/27/21 106 lb (48.1 kg)    Physical Exam Vitals reviewed.  HENT:     Head: Normocephalic and atraumatic.  Eyes:     Pupils: Pupils are equal, round, and reactive to light.  Cardiovascular:     Rate and Rhythm: Normal rate and regular rhythm.     Heart sounds: Normal heart sounds.  Pulmonary:     Effort: Pulmonary effort is normal.     Breath sounds: Normal breath sounds.  Abdominal:     General: Bowel sounds are normal.     Palpations: Abdomen is soft.  Musculoskeletal:        General: No tenderness or deformity. Normal range of motion.     Cervical back: Normal range of motion.  Lymphadenopathy:     Cervical: No cervical adenopathy.  Skin:    General: Skin is warm and dry.     Findings: No erythema or rash.  Neurological:     Mental Status: She is alert and oriented to person, place, and time.  Psychiatric:        Behavior: Behavior normal.        Thought Content: Thought content normal.        Judgment: Judgment normal.      Lab Results  Component Value Date   WBC 9.1 08/22/2021   HGB 13.6 08/22/2021   HCT 39.3 08/22/2021   MCV 101.8 (H) 08/22/2021   PLT 222 08/22/2021   Lab Results  Component Value Date   FERRITIN 258  10/25/2020   IRON 136 10/25/2020   TIBC 353 10/25/2020   UIBC 216 10/25/2020   IRONPCTSAT 39 10/25/2020   Lab Results  Component Value Date   RBC 3.86 (L) 08/22/2021   Lab Results  Component Value Date   KPAFRELGTCHN 16.6 07/25/2021   LAMBDASER 1.8 (L) 07/25/2021  KAPLAMBRATIO 9.22 (H) 07/25/2021   Lab Results  Component Value Date   IGGSERUM 436 (L) 07/25/2021   IGGSERUM 505 (L) 07/25/2021   IGA <5 (L) 07/25/2021   IGA <5 (L) 07/25/2021   IGMSERUM 6 (L) 07/25/2021   IGMSERUM 7 (L) 07/25/2021   Lab Results  Component Value Date   TOTALPROTELP 5.8 (L) 07/25/2021   ALBUMINELP 3.7 07/25/2021   A1GS 0.2 07/25/2021   A2GS 0.7 07/25/2021   BETS 0.9 07/25/2021   GAMS 0.4 07/25/2021   MSPIKE 0.3 (H) 07/25/2021   SPEI Comment 01/28/2018     Chemistry      Component Value Date/Time   NA 140 08/22/2021 0923   NA 148 (H) 07/16/2017 1128   NA 143 03/05/2017 1051   K 4.6 08/22/2021 0923   K 3.8 07/16/2017 1128   K 3.7 03/05/2017 1051   CL 106 08/22/2021 0923   CL 106 07/16/2017 1128   CO2 24 08/22/2021 0923   CO2 29 07/16/2017 1128   CO2 25 03/05/2017 1051   BUN 23 08/22/2021 0923   BUN 23 (H) 07/16/2017 1128   BUN 18.2 03/05/2017 1051   CREATININE 0.72 08/22/2021 0923   CREATININE 0.75 06/20/2020 1127   CREATININE 0.8 03/05/2017 1051      Component Value Date/Time   CALCIUM 9.8 08/22/2021 0923   CALCIUM 9.7 07/16/2017 1128   CALCIUM 8.9 03/05/2017 1051   ALKPHOS 53 08/22/2021 0923   ALKPHOS 71 07/16/2017 1128   ALKPHOS 67 03/05/2017 1051   AST 14 (L) 08/22/2021 0923   AST 15 03/05/2017 1051   ALT 14 08/22/2021 0923   ALT 23 07/16/2017 1128   ALT 17 03/05/2017 1051   BILITOT 0.5 08/22/2021 0923   BILITOT 0.55 03/05/2017 1051        Impression and Plan: Ms. Backs is a very pleasant 76 yo caucasian female with IgG Kappa Myeloma.   We will have to see what her myeloma studies look like.  Again they have been nice and low.  They have been holding pretty  steady.  I just feel bad that her poor back is causing her problems.  She may need to have RFA of the back.  We will go ahead with her Kyprolis/Faspro today.  We will plan to get her back in 1 month.  She will get her Delton See today.    [name], MD 2/1/202310:56 AM

## 2021-08-23 ENCOUNTER — Telehealth: Payer: Self-pay | Admitting: *Deleted

## 2021-08-23 LAB — KAPPA/LAMBDA LIGHT CHAINS
Kappa free light chain: 19.8 mg/L — ABNORMAL HIGH (ref 3.3–19.4)
Kappa, lambda light chain ratio: 11 — ABNORMAL HIGH (ref 0.26–1.65)
Lambda free light chains: 1.8 mg/L — ABNORMAL LOW (ref 5.7–26.3)

## 2021-08-23 LAB — IGG, IGA, IGM
IgA: <5 mg/dL — ABNORMAL LOW (ref 64–422)
IgG (Immunoglobin G), Serum: 493 mg/dL — ABNORMAL LOW (ref 586–1602)
IgM (Immunoglobulin M), Srm: 6 mg/dL — ABNORMAL LOW (ref 26–217)

## 2021-08-23 NOTE — Telephone Encounter (Signed)
-----   Message from [name], MD sent at 08/23/2021  1:03 PM EST ----- Call - the light chain is going up slowly.  I think we need to increase the frequency of treatments!!  I will adjust her protocol and make sure that scheduling knows of the change.  [name]

## 2021-08-23 NOTE — Telephone Encounter (Signed)
As noted below by Dr. Marin Olp, I informed the patient that the light chain is going up slowly. Dr. Marin Olp wants to increase the frequency of treatments. He will adjust your protocol and make sure that scheduling knows of the change. She verbalized understanding.

## 2021-08-28 LAB — PROTEIN ELECTROPHORESIS, SERUM, WITH REFLEX
A/G Ratio: 1.5 (ref 0.7–1.7)
Albumin ELP: 3.8 g/dL (ref 2.9–4.4)
Alpha-1-Globulin: 0.3 g/dL (ref 0.0–0.4)
Alpha-2-Globulin: 0.8 g/dL (ref 0.4–1.0)
Beta Globulin: 0.9 g/dL (ref 0.7–1.3)
Gamma Globulin: 0.5 g/dL (ref 0.4–1.8)
Globulin, Total: 2.5 g/dL (ref 2.2–3.9)
M-Spike, %: 0.3 g/dL — ABNORMAL HIGH
SPEP Interpretation: 0
Total Protein ELP: 6.3 g/dL (ref 6.0–8.5)

## 2021-08-28 LAB — IMMUNOFIXATION REFLEX, SERUM
IgA: <5 mg/dL — ABNORMAL LOW (ref 64–422)
IgG (Immunoglobin G), Serum: 533 mg/dL — ABNORMAL LOW (ref 586–1602)
IgM (Immunoglobulin M), Srm: 7 mg/dL — ABNORMAL LOW (ref 26–217)

## 2021-09-01 ENCOUNTER — Other Ambulatory Visit: Payer: Self-pay | Admitting: Hematology & Oncology

## 2021-09-01 DIAGNOSIS — C9000 Multiple myeloma not having achieved remission: Secondary | ICD-10-CM

## 2021-09-01 DIAGNOSIS — R5383 Other fatigue: Secondary | ICD-10-CM

## 2021-09-03 ENCOUNTER — Other Ambulatory Visit: Payer: Self-pay | Admitting: *Deleted

## 2021-09-03 ENCOUNTER — Telehealth: Payer: Self-pay | Admitting: *Deleted

## 2021-09-03 ENCOUNTER — Encounter: Payer: Self-pay | Admitting: Hematology & Oncology

## 2021-09-03 DIAGNOSIS — R5383 Other fatigue: Secondary | ICD-10-CM

## 2021-09-03 DIAGNOSIS — C9000 Multiple myeloma not having achieved remission: Secondary | ICD-10-CM

## 2021-09-03 MED ORDER — TEMAZEPAM 7.5 MG PO CAPS: ORAL_CAPSULE | ORAL | 2 refills | Status: DC

## 2021-09-04 ENCOUNTER — Encounter: Payer: Self-pay | Admitting: Hematology & Oncology

## 2021-09-04 NOTE — Telephone Encounter (Signed)
Erroneous encounter

## 2021-09-14 DIAGNOSIS — M48062 Spinal stenosis, lumbar region with neurogenic claudication: Secondary | ICD-10-CM | POA: Diagnosis not present

## 2021-09-19 ENCOUNTER — Encounter: Payer: Self-pay | Admitting: Hematology & Oncology

## 2021-09-19 ENCOUNTER — Other Ambulatory Visit: Payer: Self-pay

## 2021-09-19 ENCOUNTER — Inpatient Hospital Stay: Payer: PPO

## 2021-09-19 ENCOUNTER — Inpatient Hospital Stay: Payer: PPO | Attending: Hematology & Oncology

## 2021-09-19 ENCOUNTER — Inpatient Hospital Stay (HOSPITAL_BASED_OUTPATIENT_CLINIC_OR_DEPARTMENT_OTHER): Payer: PPO | Admitting: Hematology & Oncology

## 2021-09-19 VITALS — BP 148/77 | HR 78 | Temp 97.7°F | Resp 20 | Wt 109.1 lb

## 2021-09-19 DIAGNOSIS — D509 Iron deficiency anemia, unspecified: Secondary | ICD-10-CM | POA: Insufficient documentation

## 2021-09-19 DIAGNOSIS — Z79899 Other long term (current) drug therapy: Secondary | ICD-10-CM | POA: Insufficient documentation

## 2021-09-19 DIAGNOSIS — Z5112 Encounter for antineoplastic immunotherapy: Secondary | ICD-10-CM | POA: Diagnosis not present

## 2021-09-19 DIAGNOSIS — C9000 Multiple myeloma not having achieved remission: Secondary | ICD-10-CM

## 2021-09-19 LAB — CMP (CANCER CENTER ONLY)
ALT: 15 U/L (ref 0–44)
AST: 14 U/L — ABNORMAL LOW (ref 15–41)
Albumin: 4 g/dL (ref 3.5–5.0)
Alkaline Phosphatase: 54 U/L (ref 38–126)
Anion gap: 9 (ref 5–15)
BUN: 20 mg/dL (ref 8–23)
CO2: 26 mmol/L (ref 22–32)
Calcium: 8.7 mg/dL — ABNORMAL LOW (ref 8.9–10.3)
Chloride: 106 mmol/L (ref 98–111)
Creatinine: 0.72 mg/dL (ref 0.44–1.00)
GFR, Estimated: >60 mL/min (ref >60)
Glucose, Bld: 141 mg/dL — ABNORMAL HIGH (ref 70–99)
Potassium: 4 mmol/L (ref 3.5–5.1)
Sodium: 141 mmol/L (ref 135–145)
Total Bilirubin: 0.4 mg/dL (ref 0.3–1.2)
Total Protein: 6 g/dL — ABNORMAL LOW (ref 6.5–8.1)

## 2021-09-19 LAB — CBC WITH DIFFERENTIAL (CANCER CENTER ONLY)
Abs Immature Granulocytes: 0.05 K/uL (ref 0.00–0.07)
Basophils Absolute: 0 K/uL (ref 0.0–0.1)
Basophils Relative: 0 %
Eosinophils Absolute: 0 K/uL (ref 0.0–0.5)
Eosinophils Relative: 0 %
HCT: 40.2 % (ref 36.0–46.0)
Hemoglobin: 13.6 g/dL (ref 12.0–15.0)
Immature Granulocytes: 1 %
Lymphocytes Relative: 20 %
Lymphs Abs: 1.1 K/uL (ref 0.7–4.0)
MCH: 34.7 pg — ABNORMAL HIGH (ref 26.0–34.0)
MCHC: 33.8 g/dL (ref 30.0–36.0)
MCV: 102.6 fL — ABNORMAL HIGH (ref 80.0–100.0)
Monocytes Absolute: 0.4 K/uL (ref 0.1–1.0)
Monocytes Relative: 7 %
Neutro Abs: 4.1 K/uL (ref 1.7–7.7)
Neutrophils Relative %: 72 %
Platelet Count: 206 K/uL (ref 150–400)
RBC: 3.92 MIL/uL (ref 3.87–5.11)
RDW: 12.8 % (ref 11.5–15.5)
WBC Count: 5.7 K/uL (ref 4.0–10.5)
nRBC: 0 % (ref 0.0–0.2)

## 2021-09-19 LAB — LACTATE DEHYDROGENASE: LDH: 194 U/L — ABNORMAL HIGH (ref 98–192)

## 2021-09-19 MED ORDER — SODIUM CHLORIDE 0.9 % IV SOLN: Freq: Once | INTRAVENOUS | Status: AC

## 2021-09-19 MED ORDER — BELSOMRA 10 MG PO TABS
10.0000 mg | ORAL_TABLET | Freq: Every evening | ORAL | 0 refills | Status: DC | PRN
Start: 2021-09-19 — End: 2021-10-23

## 2021-09-19 MED ORDER — DEXTROSE 5 % IV SOLN
70.0000 mg/m2 | Freq: Once | INTRAVENOUS | Status: AC
  Administered 2021-09-19: 100 mg via INTRAVENOUS
  Filled 2021-09-19: qty 30

## 2021-09-19 MED ORDER — SODIUM CHLORIDE 0.9 % IV SOLN
40.0000 mg | Freq: Once | INTRAVENOUS | Status: AC
  Administered 2021-09-19: 40 mg via INTRAVENOUS
  Filled 2021-09-19: qty 4

## 2021-09-19 MED ORDER — DIPHENHYDRAMINE HCL 25 MG PO CAPS: 25.0000 mg | ORAL_CAPSULE | Freq: Once | ORAL | Status: DC

## 2021-09-19 MED ORDER — ACETAMINOPHEN 325 MG PO TABS: 650.0000 mg | ORAL_TABLET | Freq: Once | ORAL | Status: DC

## 2021-09-19 MED ORDER — DARATUMUMAB-HYALURONIDASE-FIHJ 1800-30000 MG-UT/15ML ~~LOC~~ SOLN
1800.0000 mg | Freq: Once | SUBCUTANEOUS | Status: AC
  Administered 2021-09-19: 1800 mg via SUBCUTANEOUS
  Filled 2021-09-19: qty 15

## 2021-09-19 MED ORDER — SODIUM CHLORIDE 0.9 % IV SOLN: Freq: Once | INTRAVENOUS | Status: DC

## 2021-09-19 NOTE — Progress Notes (Signed)
Hematology and Oncology Follow Up Visit  Debra Ruiz [id number] 01-31-1946 76 y.o. 09/19/2021   Principle Diagnosis:  IgG Kappa myeloma - compression fractures - 13q-, +11, 14q+ - hyperdiploid -- progressive Iron def anemia  Completed Therapy: S/p Kyphoplasty of T11 and T12 XRT to lower spine-completed on 09/25/2016  Current Therapy:    Faspro -- start on 04/05/2020 -- s/p cycle #16 -- Kyprolis added on 07/20/2020 -- s/p cycle #17 Ninlaro 2.3 mg po q week (3 on/1 off) -- start on 01/24/2020 -- d/c on 03/29/2020 due to toxicity Velcade/Pomalyst/Decadron -Start on 09/02/2018 --Pomalyst on hold since 11/09/2018.  D/C on 01/17/2020 Xgeva 138m Sq q  3 month - next dose in 11/2021  IV Injectafer -- last dose given on 11/2018   Interim History:  Debra Ruiz here today for follow-up.  She is under a lot of stress right now.  Her husband's children from a previous marriage are now involved with respect to him and his dementia.  They are trying to make sure that they have access to his money.  This is pretty pathetic.  Hopefully, Debra Ruiz get a good lawyer herself to try to help out the situation.  She is also having some insurance issues.  She had a bill that she brought in.  I am not sure exactly what the problem was with the bill.  It could have been some problem with billing or coding.  I am sure this will be resolved.  Her back still bothers her.  She is not a candidate for RFA.  Over last saw her, her monoclonal studies for the myeloma showed a kappa light chain to be up a little bit.  It was 1.9 mg/dL.  I will try to hold off on changing her protocol.  She is having no problems with her appetite.  She says that she needs to have a different sleep medicine because her insurance is not co-pay for the Restoril.  There is been no change in bowel or bladder habits.  She has had no issues with rashes.  There is been no leg swelling.  She has had no problems with cough or shortness  of breath.  Overall, I would say performance status is ECOG 1.      Medications:  Allergies as of 09/19/2021       Reactions   Crestor [rosuvastatin Calcium] Other (See Comments)   Causes liver functions to be elevated        Medication List        Accurate as of September 19, 2021 10:21 AM. If you have any questions, ask your nurse or doctor.          ALPRAZolam 0.25 MG tablet Commonly known as: XANAX Take 1 tablet (0.25 mg total) by mouth 2 (two) times daily as needed. for anxiety   amLODipine 5 MG tablet Commonly known as: NORVASC TAKE 1 TABLET(5 MG) BY MOUTH DAILY   aspirin 81 MG EC tablet Take 81 mg by mouth every other day.   atorvastatin 20 MG tablet Commonly known as: LIPITOR TAKE 1 TABLET(20 MG) BY MOUTH DAILY   buPROPion 300 MG 24 hr tablet Commonly known as: WELLBUTRIN XL TAKE 1 TABLET(300 MG) BY MOUTH DAILY   dexamethasone 4 MG tablet Commonly known as: DECADRON TAKE 3 TABLETS BY MOUTH 1 TIME A WEEK   escitalopram 20 MG tablet Commonly known as: LEXAPRO TAKE 1 TABLET(20 MG) BY MOUTH DAILY   famciclovir 250 MG tablet Commonly known  as: FAMVIR TAKE 1 TABLET(250 MG) BY MOUTH DAILY   gabapentin 300 MG capsule Commonly known as: NEURONTIN Take 300 mg by mouth 3 (three) times daily.   IRON PO Take by mouth daily.   LORazepam 0.5 MG tablet Commonly known as: Ativan Take 1 tablet (0.5 mg total) by mouth every 6 (six) hours as needed (Nausea or vomiting).   nebivolol 5 MG tablet Commonly known as: Bystolic TAKE 1 TABLET(5 MG) BY MOUTH DAILY   potassium chloride SA 20 MEQ tablet Commonly known as: KLOR-CON M TAKE 1 TABLET(20 MEQ) BY MOUTH DAILY   prochlorperazine 10 MG tablet Commonly known as: COMPAZINE TAKE 1 TABLET(10 MG) BY MOUTH EVERY 6 HOURS AS NEEDED FOR NAUSEA OR VOMITING   REFRESH DRY EYE THERAPY OP Apply to eye 3 (three) times daily.   temazepam 7.5 MG capsule Commonly known as: RESTORIL TAKE 1 CAPSULE BY MOUTH AT BEDTIME AS  NEEDED FOR SLEEP   Vitamin B-12 5000 MCG Tbdp Take by mouth daily.   Vitamin D 50 MCG (2000 UT) Caps Take 2,000 Units by mouth daily.        Allergies:  Allergies  Allergen Reactions   Crestor [Rosuvastatin Calcium] Other (See Comments)    Causes liver functions to be elevated    Past Medical History, Surgical history, Social history, and Family History were reviewed and updated.  Review of Systems: Review of Systems  Constitutional: Negative.   HENT: Negative.    Eyes: Negative.   Respiratory: Negative.    Cardiovascular: Negative.   Gastrointestinal: Negative.   Genitourinary: Negative.   Musculoskeletal:  Positive for back pain.  Skin: Negative.   Neurological: Negative.   Endo/Heme/Allergies: Negative.   Psychiatric/Behavioral: Negative.     Physical Exam:  weight is 109 lb 1.3 oz (49.5 kg). Her oral temperature is 97.7 F (36.5 C). Her blood pressure is 148/77 (abnormal) and her pulse is 78. Her respiration is 20 and oxygen saturation is 99%.   Wt Readings from Last 3 Encounters:  09/19/21 109 lb 1.3 oz (49.5 kg)  08/22/21 108 lb (49 kg)  07/25/21 106 lb (48.1 kg)    Physical Exam Vitals reviewed.  HENT:     Head: Normocephalic and atraumatic.  Eyes:     Pupils: Pupils are equal, round, and reactive to light.  Cardiovascular:     Rate and Rhythm: Normal rate and regular rhythm.     Heart sounds: Normal heart sounds.  Pulmonary:     Effort: Pulmonary effort is normal.     Breath sounds: Normal breath sounds.  Abdominal:     General: Bowel sounds are normal.     Palpations: Abdomen is soft.  Musculoskeletal:        General: No tenderness or deformity. Normal range of motion.     Cervical back: Normal range of motion.  Lymphadenopathy:     Cervical: No cervical adenopathy.  Skin:    General: Skin is warm and dry.     Findings: No erythema or rash.  Neurological:     Mental Status: She is alert and oriented to person, place, and time.   Psychiatric:        Behavior: Behavior normal.        Thought Content: Thought content normal.        Judgment: Judgment normal.      Lab Results  Component Value Date   WBC 5.7 09/19/2021   HGB 13.6 09/19/2021   HCT 40.2 09/19/2021   MCV 102.6 (H)  09/19/2021   PLT 206 09/19/2021   Lab Results  Component Value Date   FERRITIN 258 10/25/2020   IRON 136 10/25/2020   TIBC 353 10/25/2020   UIBC 216 10/25/2020   IRONPCTSAT 39 10/25/2020   Lab Results  Component Value Date   RBC 3.92 09/19/2021   Lab Results  Component Value Date   KPAFRELGTCHN 19.8 (H) 08/22/2021   LAMBDASER 1.8 (L) 08/22/2021   KAPLAMBRATIO 11.00 (H) 08/22/2021   Lab Results  Component Value Date   IGGSERUM 493 (L) 08/22/2021   IGGSERUM 533 (L) 08/22/2021   IGA <5 (L) 08/22/2021   IGA <5 (L) 08/22/2021   IGMSERUM 6 (L) 08/22/2021   IGMSERUM 7 (L) 08/22/2021   Lab Results  Component Value Date   TOTALPROTELP 6.3 08/22/2021   ALBUMINELP 3.8 08/22/2021   A1GS 0.3 08/22/2021   A2GS 0.8 08/22/2021   BETS 0.9 08/22/2021   GAMS 0.5 08/22/2021   MSPIKE 0.3 (H) 08/22/2021   SPEI Comment 01/28/2018     Chemistry      Component Value Date/Time   NA 141 09/19/2021 0846   NA 148 (H) 07/16/2017 1128   NA 143 03/05/2017 1051   K 4.0 09/19/2021 0846   K 3.8 07/16/2017 1128   K 3.7 03/05/2017 1051   CL 106 09/19/2021 0846   CL 106 07/16/2017 1128   CO2 26 09/19/2021 0846   CO2 29 07/16/2017 1128   CO2 25 03/05/2017 1051   BUN 20 09/19/2021 0846   BUN 23 (H) 07/16/2017 1128   BUN 18.2 03/05/2017 1051   CREATININE 0.72 09/19/2021 0846   CREATININE 0.75 06/20/2020 1127   CREATININE 0.8 03/05/2017 1051      Component Value Date/Time   CALCIUM 8.7 (L) 09/19/2021 0846   CALCIUM 9.7 07/16/2017 1128   CALCIUM 8.9 03/05/2017 1051   ALKPHOS 54 09/19/2021 0846   ALKPHOS 71 07/16/2017 1128   ALKPHOS 67 03/05/2017 1051   AST 14 (L) 09/19/2021 0846   AST 15 03/05/2017 1051   ALT 15 09/19/2021 0846    ALT 23 07/16/2017 1128   ALT 17 03/05/2017 1051   BILITOT 0.4 09/19/2021 0846   BILITOT 0.55 03/05/2017 1051        Impression and Plan: Ms. Curling is a very pleasant 76 yo caucasian female with IgG Kappa Myeloma.   We will have to see what her myeloma studies look like.  Hopefully, we will not have to make a change in her protocol.  Again, she has a lot going on at home.  I just feel bad for her.  I know she is doing her best to help with her husband has Alzheimer's.  Hopefully, we will plan to get her back in just 1 more month.     [name], MD 3/1/202310:21 AM

## 2021-09-19 NOTE — Patient Instructions (Signed)
[facility]  Discharge Instructions: ?Thank you for choosing [facility] to provide your oncology and hematology care.  ? ?If you have a lab appointment with the [facility], please go directly to the [facility] and check in at the registration area. ? ?Wear comfortable clothing and clothing appropriate for easy access to any Portacath or PICC line.  ? ?We strive to give you quality time with your provider. You may need to reschedule your appointment if you arrive late (15 or more minutes).  Arriving late affects you and other patients whose appointments are after yours.  Also, if you miss three or more appointments without notifying the office, you may be dismissed from the clinic at the provider?s discretion.    ?  ?For prescription refill requests, have your pharmacy contact our office and allow 72 hours for refills to be completed.   ? ?Today you received the following chemotherapy and/or immunotherapy agents:  Darzalex Faspro and Kyprolis    ?  ?To help prevent nausea and vomiting after your treatment, we encourage you to take your nausea medication as directed. ? ?BELOW ARE SYMPTOMS THAT SHOULD BE REPORTED IMMEDIATELY: ?*FEVER GREATER THAN 100.4 F (38 ?C) OR HIGHER ?*CHILLS OR SWEATING ?*NAUSEA AND VOMITING THAT IS NOT CONTROLLED WITH YOUR NAUSEA MEDICATION ?*UNUSUAL SHORTNESS OF BREATH ?*UNUSUAL BRUISING OR BLEEDING ?*URINARY PROBLEMS (pain or burning when urinating, or frequent urination) ?*BOWEL PROBLEMS (unusual diarrhea, constipation, pain near the anus) ?TENDERNESS IN MOUTH AND THROAT WITH OR WITHOUT PRESENCE OF ULCERS (sore throat, sores in mouth, or a toothache) ?UNUSUAL RASH, SWELLING OR PAIN  ?UNUSUAL VAGINAL DISCHARGE OR ITCHING  ? ?Items with * indicate a potential emergency and should be followed up as soon as possible or go to the Emergency Department if any problems should occur. ? ?Please show the CHEMOTHERAPY ALERT CARD or IMMUNOTHERAPY ALERT CARD  at check-in to the Emergency Department and triage nurse. ?Should you have questions after your visit or need to cancel or reschedule your appointment, please contact [facility]  [phone] and follow the prompts.  Office hours are 8:00 a.m. to 4:30 p.m. Monday - Friday. Please note that voicemails left after 4:00 p.m. may not be returned until the following business day.  We are closed weekends and major holidays. You have access to a nurse at all times for urgent questions. Please call the main number to the clinic [phone] and follow the prompts. ? ?For any non-urgent questions, you may also contact your provider using MyChart. We now offer e-Visits for anyone 38 and older to request care online for non-urgent symptoms. For details visit [email or web address]. ?  ?Also download the MyChart app! Go to the app store, search "MyChart", open the app, select [facility], and log in with your MyChart username and password. ? ?Due to Covid, a mask is required upon entering the hospital/clinic. If you do not have a mask, one will be given to you upon arrival. For doctor visits, patients may have 1 support person aged 48 or older with them. For treatment visits, patients cannot have anyone with them due to current Covid guidelines and our immunocompromised population.  ?

## 2021-09-20 LAB — IGG, IGA, IGM
IgA: <5 mg/dL — ABNORMAL LOW (ref 64–422)
IgG (Immunoglobin G), Serum: 494 mg/dL — ABNORMAL LOW (ref 586–1602)
IgM (Immunoglobulin M), Srm: 6 mg/dL — ABNORMAL LOW (ref 26–217)

## 2021-09-20 LAB — KAPPA/LAMBDA LIGHT CHAINS
Kappa free light chain: 20.6 mg/L — ABNORMAL HIGH (ref 3.3–19.4)
Kappa, lambda light chain ratio: >13.73 — ABNORMAL HIGH (ref 0.26–1.65)
Lambda free light chains: <1.5 mg/L — ABNORMAL LOW (ref 5.7–26.3)

## 2021-09-26 ENCOUNTER — Other Ambulatory Visit: Payer: Self-pay | Admitting: Internal Medicine

## 2021-09-27 LAB — IMMUNOFIXATION REFLEX, SERUM
IgA: <5 mg/dL — ABNORMAL LOW (ref 64–422)
IgG (Immunoglobin G), Serum: 488 mg/dL — ABNORMAL LOW (ref 586–1602)
IgM (Immunoglobulin M), Srm: 6 mg/dL — ABNORMAL LOW (ref 26–217)

## 2021-09-27 LAB — PROTEIN ELECTROPHORESIS, SERUM, WITH REFLEX
A/G Ratio: 1.6 (ref 0.7–1.7)
Albumin ELP: 3.7 g/dL (ref 2.9–4.4)
Alpha-1-Globulin: 0.2 g/dL (ref 0.0–0.4)
Alpha-2-Globulin: 0.7 g/dL (ref 0.4–1.0)
Beta Globulin: 0.9 g/dL (ref 0.7–1.3)
Gamma Globulin: 0.4 g/dL (ref 0.4–1.8)
Globulin, Total: 2.3 g/dL (ref 2.2–3.9)
M-Spike, %: 0.3 g/dL — ABNORMAL HIGH
SPEP Interpretation: 0
Total Protein ELP: 6 g/dL (ref 6.0–8.5)

## 2021-10-08 ENCOUNTER — Other Ambulatory Visit: Payer: Self-pay

## 2021-10-08 ENCOUNTER — Other Ambulatory Visit: Payer: PPO

## 2021-10-08 DIAGNOSIS — R5383 Other fatigue: Secondary | ICD-10-CM

## 2021-10-08 DIAGNOSIS — E7800 Pure hypercholesterolemia, unspecified: Secondary | ICD-10-CM

## 2021-10-08 DIAGNOSIS — D7589 Other specified diseases of blood and blood-forming organs: Secondary | ICD-10-CM | POA: Diagnosis not present

## 2021-10-08 DIAGNOSIS — R6889 Other general symptoms and signs: Secondary | ICD-10-CM | POA: Diagnosis not present

## 2021-10-09 ENCOUNTER — Ambulatory Visit (INDEPENDENT_AMBULATORY_CARE_PROVIDER_SITE_OTHER): Payer: PPO | Admitting: Internal Medicine

## 2021-10-09 ENCOUNTER — Encounter: Payer: Self-pay | Admitting: Internal Medicine

## 2021-10-09 VITALS — BP 112/72 | HR 75 | Temp 98.7°F | Ht 59.84 in | Wt 107.0 lb

## 2021-10-09 DIAGNOSIS — R54 Age-related physical debility: Secondary | ICD-10-CM | POA: Diagnosis not present

## 2021-10-09 DIAGNOSIS — I10 Essential (primary) hypertension: Secondary | ICD-10-CM | POA: Diagnosis not present

## 2021-10-09 DIAGNOSIS — N3941 Urge incontinence: Secondary | ICD-10-CM | POA: Diagnosis not present

## 2021-10-09 DIAGNOSIS — C9000 Multiple myeloma not having achieved remission: Secondary | ICD-10-CM | POA: Diagnosis not present

## 2021-10-09 DIAGNOSIS — F419 Anxiety disorder, unspecified: Secondary | ICD-10-CM

## 2021-10-09 DIAGNOSIS — E7800 Pure hypercholesterolemia, unspecified: Secondary | ICD-10-CM

## 2021-10-09 DIAGNOSIS — F32A Depression, unspecified: Secondary | ICD-10-CM

## 2021-10-09 LAB — B12 AND FOLATE PANEL
Folate: 13.4 ng/mL
Vitamin B-12: >2000 pg/mL — ABNORMAL HIGH (ref 200–1100)

## 2021-10-09 LAB — TSH: TSH: 3.14 mIU/L (ref 0.40–4.50)

## 2021-10-09 LAB — LIPID PANEL
Cholesterol: 202 mg/dL — ABNORMAL HIGH (ref <200)
HDL: 58 mg/dL (ref >=50)
LDL Cholesterol (Calc): 116 mg/dL (calc) — ABNORMAL HIGH
Non-HDL Cholesterol (Calc): 144 mg/dL (calc) — ABNORMAL HIGH (ref <130)
Total CHOL/HDL Ratio: 3.5 (calc) (ref <5.0)
Triglycerides: 162 mg/dL — ABNORMAL HIGH (ref <150)

## 2021-10-11 ENCOUNTER — Telehealth: Payer: Self-pay | Admitting: Internal Medicine

## 2021-10-11 MED ORDER — OXYBUTYNIN CHLORIDE ER 10 MG PO TB24: 10.0000 mg | ORAL_TABLET | Freq: Every day | ORAL | 5 refills | Status: DC

## 2021-10-11 MED ORDER — ALPRAZOLAM 0.5 MG PO TABS: 0.5000 mg | ORAL_TABLET | Freq: Two times a day (BID) | ORAL | 2 refills | Status: DC | PRN

## 2021-10-11 NOTE — Telephone Encounter (Signed)
Called patient

## 2021-10-11 NOTE — Telephone Encounter (Signed)
Valaria Good ?[phone] ? ?Caiden called to check on medication being sent to pharmacy, Xanax and medication for frequent urination from her previous appointment. ?

## 2021-10-12 DIAGNOSIS — M47812 Spondylosis without myelopathy or radiculopathy, cervical region: Secondary | ICD-10-CM | POA: Diagnosis not present

## 2021-10-12 DIAGNOSIS — M791 Myalgia, unspecified site: Secondary | ICD-10-CM | POA: Diagnosis not present

## 2021-10-12 DIAGNOSIS — M48061 Spinal stenosis, lumbar region without neurogenic claudication: Secondary | ICD-10-CM | POA: Diagnosis not present

## 2021-10-13 ENCOUNTER — Encounter: Payer: Self-pay | Admitting: Internal Medicine

## 2021-10-13 NOTE — Progress Notes (Signed)
? ?  Subjective:  ? ? Patient ID: Debra Ruiz, female    DOB: 09/11/1945, 76 y.o.   MRN: [id number] ? ?HPI 76 year old Female seen today for follow-up.  History of multiple myeloma followed by Dr. Marin Olp.  She has done extremely well under his care.  ? Her triglycerides are elevated at 162 and previously were 130 in November 2021.  LDL cholesterol has increased from 98 to 116  Total cholesterol has increased from 187-202.  She is on Lipitor.  There is situational stress with her husband's children.  This was discussed at length today.  She is quite worried.  Husband has memory issues.  She has a history of hypertension.  She has been taking B12 supplement by mouth and level is greater than 2000.  She is taking 5000 mcg daily.  She is on Norvasc and Bystolic for hypertension.   She is on Lexapro for depression and Belsomra for sleep. ?Have suggested Xanax 0.5 mg 1 tablet twice daily as needed for anxiety. ? ?Having some issues with urinary urgency and have prescribed Ditropan XL 10 mg daily.  She may need Urology consultation if this does not work.  Explained to her that these medications may or may not help.  Advised trying to find times to urinate regularly if possible.  We will continue Lipitor 20 mg daily for hyperlipidemia.  Needs to work on diet a bit. ? ? ? ? ?Review of Systems ? ?   ?Objective:  ? Physical Exam ?Blood pressure 112/72 pulse 75 temperature 98.7 degrees pulse oximetry 98% on room air.  Weight 107 pounds.  BMI 21.01 ? ? ? ?   ?Assessment & Plan:  ?Situational stress-we will try Xanax for anxiety and stress.  Continue antidepressants as previously prescribed. ? ?Hyperlipidemia-we will continue with Lipitor 20 mg daily ? ?Urinary incontinence and urgency- we will try Ditropan XL 10 mg daily ? ?Essential hypertension-continue current medications ? ?Multiple myeloma followed and treated by Dr. Marin Olp.  He saw her in March for.  History of IgG kappa myeloma with history of compression fractures.   Currently on Xgeva.  Next dose due in May. ? ?She has her Medicare wellness visit scheduled in December already.  She knows she may call or message me if she has any concerns or questions. ? ?Time spent with in person encounter is 30 minutes. ? ?

## 2021-10-13 NOTE — Patient Instructions (Signed)
Return in December for Medicare wellness visit.  Trial of Ditropan XL 10 mg daily.  Try Xanax up to twice daily for anxiety and stress.  Continue current antihypertensive medications.  Continue Lipitor for hyperlipidemia. ?

## 2021-10-17 ENCOUNTER — Inpatient Hospital Stay: Payer: PPO

## 2021-10-17 ENCOUNTER — Encounter: Payer: Self-pay | Admitting: Hematology & Oncology

## 2021-10-17 ENCOUNTER — Other Ambulatory Visit: Payer: Self-pay

## 2021-10-17 ENCOUNTER — Inpatient Hospital Stay (HOSPITAL_BASED_OUTPATIENT_CLINIC_OR_DEPARTMENT_OTHER): Payer: PPO | Admitting: Hematology & Oncology

## 2021-10-17 VITALS — BP 131/64 | HR 71 | Temp 97.9°F | Resp 19 | Wt 110.0 lb

## 2021-10-17 DIAGNOSIS — C9000 Multiple myeloma not having achieved remission: Secondary | ICD-10-CM | POA: Diagnosis not present

## 2021-10-17 DIAGNOSIS — Z5112 Encounter for antineoplastic immunotherapy: Secondary | ICD-10-CM | POA: Diagnosis not present

## 2021-10-17 LAB — CBC WITH DIFFERENTIAL (CANCER CENTER ONLY)
Abs Immature Granulocytes: 0.04 10*3/uL (ref 0.00–0.07)
Basophils Absolute: 0 10*3/uL (ref 0.0–0.1)
Basophils Relative: 0 %
Eosinophils Absolute: 0.1 10*3/uL (ref 0.0–0.5)
Eosinophils Relative: 1 %
HCT: 41 % (ref 36.0–46.0)
Hemoglobin: 13.9 g/dL (ref 12.0–15.0)
Immature Granulocytes: 1 %
Lymphocytes Relative: 16 %
Lymphs Abs: 1.2 10*3/uL (ref 0.7–4.0)
MCH: 34.6 pg — ABNORMAL HIGH (ref 26.0–34.0)
MCHC: 33.9 g/dL (ref 30.0–36.0)
MCV: 102 fL — ABNORMAL HIGH (ref 80.0–100.0)
Monocytes Absolute: 0.8 10*3/uL (ref 0.1–1.0)
Monocytes Relative: 11 %
Neutro Abs: 5.4 10*3/uL (ref 1.7–7.7)
Neutrophils Relative %: 71 %
Platelet Count: 261 10*3/uL (ref 150–400)
RBC: 4.02 MIL/uL (ref 3.87–5.11)
RDW: 12.3 % (ref 11.5–15.5)
WBC Count: 7.5 10*3/uL (ref 4.0–10.5)
nRBC: 0 % (ref 0.0–0.2)

## 2021-10-17 LAB — CMP (CANCER CENTER ONLY)
ALT: 18 U/L (ref 0–44)
AST: 14 U/L — ABNORMAL LOW (ref 15–41)
Albumin: 4.4 g/dL (ref 3.5–5.0)
Alkaline Phosphatase: 57 U/L (ref 38–126)
Anion gap: 12 (ref 5–15)
BUN: 25 mg/dL — ABNORMAL HIGH (ref 8–23)
CO2: 23 mmol/L (ref 22–32)
Calcium: 9.6 mg/dL (ref 8.9–10.3)
Chloride: 104 mmol/L (ref 98–111)
Creatinine: 0.75 mg/dL (ref 0.44–1.00)
GFR, Estimated: >60 mL/min (ref >60)
Glucose, Bld: 122 mg/dL — ABNORMAL HIGH (ref 70–99)
Potassium: 3.7 mmol/L (ref 3.5–5.1)
Sodium: 139 mmol/L (ref 135–145)
Total Bilirubin: 0.6 mg/dL (ref 0.3–1.2)
Total Protein: 6.4 g/dL — ABNORMAL LOW (ref 6.5–8.1)

## 2021-10-17 LAB — LACTATE DEHYDROGENASE: LDH: 207 U/L — ABNORMAL HIGH (ref 98–192)

## 2021-10-17 MED ORDER — DIPHENHYDRAMINE HCL 25 MG PO CAPS: 25.0000 mg | ORAL_CAPSULE | Freq: Once | ORAL | Status: DC

## 2021-10-17 MED ORDER — DARATUMUMAB-HYALURONIDASE-FIHJ 1800-30000 MG-UT/15ML ~~LOC~~ SOLN
1800.0000 mg | Freq: Once | SUBCUTANEOUS | Status: AC
  Administered 2021-10-17: 1800 mg via SUBCUTANEOUS
  Filled 2021-10-17: qty 15

## 2021-10-17 MED ORDER — SODIUM CHLORIDE 0.9 % IV SOLN
40.0000 mg | Freq: Once | INTRAVENOUS | Status: AC
  Administered 2021-10-17: 40 mg via INTRAVENOUS
  Filled 2021-10-17: qty 4

## 2021-10-17 MED ORDER — DEXTROSE 5 % IV SOLN
70.0000 mg/m2 | Freq: Once | INTRAVENOUS | Status: AC
  Administered 2021-10-17: 100 mg via INTRAVENOUS
  Filled 2021-10-17: qty 30

## 2021-10-17 MED ORDER — SODIUM CHLORIDE 0.9 % IV SOLN: Freq: Once | INTRAVENOUS | Status: AC

## 2021-10-17 MED ORDER — ACETAMINOPHEN 325 MG PO TABS: 650.0000 mg | ORAL_TABLET | Freq: Once | ORAL | Status: DC

## 2021-10-17 NOTE — Progress Notes (Signed)
?Hematology and Oncology Follow Up Visit ? ?Debra Ruiz ?[id number] ?[birth year] 76 y.o. ?10/17/2021 ? ? ?Principle Diagnosis:  ?IgG Kappa myeloma - compression fractures - 13q-, +11, 14q+ - hyperdiploid -- progressive ?Iron def anemia ? ?Completed Therapy: ?S/p Kyphoplasty of T11 and T12 ?XRT to lower spine-completed on 09/25/2016 ? ?Current Therapy:   ? ?Faspro -- start on 04/05/2020 -- s/p cycle #16 -- Kyprolis added on 07/20/2020 -- s/p cycle #20 ?Ninlaro 2.3 mg po q week (3 on/1 off) -- start on 01/24/2020 -- d/c on 03/29/2020 due to toxicity ?Velcade/Pomalyst/Decadron -Start on 09/02/2018 --Pomalyst on hold since 11/09/2018.  D/C on 01/17/2020 ?Xgeva 13m Sq q  3 month - next dose in 11/2021  ?IV Injectafer -- last dose given on 11/2018 ?  ?Interim History:  [name] here today for follow-up.  Debra Ruiz problem now is that Debra Ruiz is having some pruritus.  This is on Debra Ruiz arms.  I am unsure as to why Debra Ruiz is having this.  A lot of this might be stress.  There is still issues with respect to Debra Ruiz husband's children from a previous marriage.  I think [place].  This might be a little bit stressful. ? ?Debra Ruiz lower back is also causing Debra Ruiz tunnel problems.  I am not sure what else can be done for this.  Debra Ruiz sees [employer] [facility]  Debra Ruiz would like to get a second opinion.  I will see about making referral [facility]   ? ?Again, I will think anything with the back is related to the myeloma.  I know we have done studies on Debra Ruiz before which have not shown any myelomatous involvement.  Debra Ruiz had Debra Ruiz last set of MRIs back in August which were okay. ? ?Debra Ruiz myeloma is holding pretty steady.  The kappa light chain was 2.1 mg/dL.  Again this is holding pretty steady.  Debra Ruiz monoclonal spike was 0.3 g/dL.  Debra Ruiz IgG level was 490 mg/dL. ? ?Debra Ruiz appetite is doing okay.  Debra Ruiz is having no problems with bowels or bladder.  Debra Ruiz is having no cough or shortness of  breath. ? ?Overall, I would say performance status is probably ECOG 2. ? ?  ?Medications:  ?Allergies as of 10/17/2021   ? ?   Reactions  ? Crestor [rosuvastatin Calcium] Other (See Comments)  ? Causes liver functions to be elevated  ? ?  ? ?  ?Medication List  ?  ? ?  ? Accurate as of October 17, 2021 10:42 AM. If you have any questions, ask your nurse or doctor.  ?  ?  ? ?  ? ?ALPRAZolam 0.5 MG tablet ?Commonly known as: XDuanne Moron?Take 1 tablet (0.5 mg total) by mouth 2 (two) times daily as needed for anxiety. ?  ?amLODipine 5 MG tablet ?Commonly known as: NORVASC ?TAKE 1 TABLET(5 MG) BY MOUTH DAILY ?  ?aspirin 81 MG EC tablet ?Take 81 mg by mouth every other day. ?  ?atorvastatin 20 MG tablet ?Commonly known as: LIPITOR ?TAKE 1 TABLET(20 MG) BY MOUTH DAILY ?  ?Belsomra 10 MG Tabs ?Generic drug: Suvorexant ?Take 10 mg by mouth at bedtime as needed. ?  ?buPROPion 300 MG 24 hr tablet ?Commonly known as: WELLBUTRIN XL ?TAKE 1 TABLET(300 MG) BY MOUTH DAILY ?  ?dexamethasone 4 MG tablet ?Commonly known as: DECADRON ?TAKE 3 TABLETS BY MOUTH 1 TIME A WEEK ?  ?escitalopram 20 MG tablet ?Commonly known as: LEXAPRO ?TAKE  1 TABLET(20 MG) BY MOUTH DAILY ?  ?famciclovir 250 MG tablet ?Commonly known as: FAMVIR ?TAKE 1 TABLET(250 MG) BY MOUTH DAILY ?  ?gabapentin 300 MG capsule ?Commonly known as: NEURONTIN ?Take 300 mg by mouth 3 (three) times daily. ?  ?IRON PO ?Take by mouth daily. ?  ?nebivolol 5 MG tablet ?Commonly known as: Bystolic ?TAKE 1 TABLET(5 MG) BY MOUTH DAILY ?  ?oxybutynin 10 MG 24 hr tablet ?Commonly known as: DITROPAN-XL ?Take 1 tablet (10 mg total) by mouth at bedtime. ?  ?potassium chloride SA 20 MEQ tablet ?Commonly known as: KLOR-CON M ?TAKE 1 TABLET(20 MEQ) BY MOUTH DAILY ?  ?prochlorperazine 10 MG tablet ?Commonly known as: COMPAZINE ?TAKE 1 TABLET(10 MG) BY MOUTH EVERY 6 HOURS AS NEEDED FOR NAUSEA OR VOMITING ?  ?REFRESH DRY EYE THERAPY OP ?Apply to eye 3 (three) times daily. ?  ?Vitamin B-12 5000 MCG  Tbdp ?Take by mouth daily. ?  ?Vitamin D 50 MCG (2000 UT) Caps ?Take 2,000 Units by mouth daily. ?  ? ?  ? ? ?Allergies:  ?Allergies  ?Allergen Reactions  ? Crestor [Rosuvastatin Calcium] Other (See Comments)  ?  Causes liver functions to be elevated  ? ? ?Past Medical History, Surgical history, Social history, and Family History were reviewed and updated. ? ?Review of Systems: ?Review of Systems  ?Constitutional: Negative.   ?HENT: Negative.    ?Eyes: Negative.   ?Respiratory: Negative.    ?Cardiovascular: Negative.   ?Gastrointestinal: Negative.   ?Genitourinary: Negative.   ?Musculoskeletal:  Positive for back pain.  ?Skin: Negative.   ?Neurological: Negative.   ?Endo/Heme/Allergies: Negative.   ?Psychiatric/Behavioral: Negative.    ? ?Physical Exam: ? weight is 110 lb (49.9 kg). Debra Ruiz oral temperature is 97.9 ?F (36.6 ?C). Debra Ruiz blood pressure is 131/64 and Debra Ruiz pulse is 71. Debra Ruiz respiration is 19 and oxygen saturation is 96%.  ? ?Wt Readings from Last 3 Encounters:  ?10/17/21 110 lb (49.9 kg)  ?10/09/21 107 lb (48.5 kg)  ?09/19/21 109 lb 1.3 oz (49.5 kg)  ? ? ?Physical Exam ?Vitals reviewed.  ?HENT:  ?   Head: Normocephalic and atraumatic.  ?Eyes:  ?   Pupils: Pupils are equal, round, and reactive to light.  ?Cardiovascular:  ?   Rate and Rhythm: Normal rate and regular rhythm.  ?   Heart sounds: Normal heart sounds.  ?Pulmonary:  ?   Effort: Pulmonary effort is normal.  ?   Breath sounds: Normal breath sounds.  ?Abdominal:  ?   General: Bowel sounds are normal.  ?   Palpations: Abdomen is soft.  ?Musculoskeletal:     ?   General: No tenderness or deformity. Normal range of motion.  ?   Cervical back: Normal range of motion.  ?Lymphadenopathy:  ?   Cervical: No cervical adenopathy.  ?Skin: ?   General: Skin is warm and dry.  ?   Findings: No erythema or rash.  ?Neurological:  ?   Mental Status: Debra Ruiz is alert and oriented to person, place, and time.  ?Psychiatric:     ?   Behavior: Behavior normal.     ?   Thought  Content: Thought content normal.     ?   Judgment: Judgment normal.  ? ?  ? ?Lab Results  ?Component Value Date  ? WBC 7.5 10/17/2021  ? HGB 13.9 10/17/2021  ? HCT 41.0 10/17/2021  ? MCV 102.0 (H) 10/17/2021  ? PLT 261 10/17/2021  ? ?Lab Results  ?Component Value Date  ?  FERRITIN 258 10/25/2020  ? IRON 136 10/25/2020  ? TIBC 353 10/25/2020  ? UIBC 216 10/25/2020  ? IRONPCTSAT 39 10/25/2020  ? ?Lab Results  ?Component Value Date  ? RBC 4.02 10/17/2021  ? ?Lab Results  ?Component Value Date  ? KPAFRELGTCHN 20.6 (H) 09/19/2021  ? LAMBDASER <1.5 (L) 09/19/2021  ? KAPLAMBRATIO >13.73 (H) 09/19/2021  ? ?Lab Results  ?Component Value Date  ? IGGSERUM 494 (L) 09/19/2021  ? IGGSERUM 488 (L) 09/19/2021  ? IGA <5 (L) 09/19/2021  ? IGA <5 (L) 09/19/2021  ? IGMSERUM 6 (L) 09/19/2021  ? IGMSERUM 6 (L) 09/19/2021  ? ?Lab Results  ?Component Value Date  ? TOTALPROTELP 6.0 09/19/2021  ? ALBUMINELP 3.7 09/19/2021  ? A1GS 0.2 09/19/2021  ? A2GS 0.7 09/19/2021  ? BETS 0.9 09/19/2021  ? GAMS 0.4 09/19/2021  ? MSPIKE 0.3 (H) 09/19/2021  ? SPEI Comment 01/28/2018  ? ?  Chemistry   ?   ?Component Value Date/Time  ? NA 139 10/17/2021 0900  ? NA 148 (H) 07/16/2017 1128  ? NA 143 03/05/2017 1051  ? K 3.7 10/17/2021 0900  ? K 3.8 07/16/2017 1128  ? K 3.7 03/05/2017 1051  ? CL 104 10/17/2021 0900  ? CL 106 07/16/2017 1128  ? CO2 23 10/17/2021 0900  ? CO2 29 07/16/2017 1128  ? CO2 25 03/05/2017 1051  ? BUN 25 (H) 10/17/2021 0900  ? BUN 23 (H) 07/16/2017 1128  ? BUN 18.2 03/05/2017 1051  ? CREATININE 0.75 10/17/2021 0900  ? CREATININE 0.75 06/20/2020 1127  ? CREATININE 0.8 03/05/2017 1051  ?    ?Component Value Date/Time  ? CALCIUM 9.6 10/17/2021 0900  ? CALCIUM 9.7 07/16/2017 1128  ? CALCIUM 8.9 03/05/2017 1051  ? ALKPHOS 57 10/17/2021 0900  ? ALKPHOS 71 07/16/2017 1128  ? ALKPHOS 67 03/05/2017 1051  ? AST 14 (L) 10/17/2021 0900  ? AST 15 03/05/2017 1051  ? ALT 18 10/17/2021 0900  ? ALT 23 07/16/2017 1128  ? ALT 17 03/05/2017 1051  ? BILITOT 0.6  10/17/2021 0900  ? BILITOT 0.55 03/05/2017 1051  ?  ? ?   ?Impression and Plan: Ms. Cuff is a very pleasant 76 yo caucasian female with IgG Kappa Myeloma.  ? ?I just hate that Debra Ruiz is having always problems.  T

## 2021-10-17 NOTE — Patient Instructions (Signed)
[facility]  Discharge Instructions: ?Thank you for choosing [facility] to provide your oncology and hematology care.  ? ?If you have a lab appointment with the [facility], please go directly to the [facility] and check in at the registration area. ? ?Wear comfortable clothing and clothing appropriate for easy access to any Portacath or PICC line.  ? ?We strive to give you quality time with your provider. You may need to reschedule your appointment if you arrive late (15 or more minutes).  Arriving late affects you and other patients whose appointments are after yours.  Also, if you miss three or more appointments without notifying the office, you may be dismissed from the clinic at the provider?s discretion.    ?  ?For prescription refill requests, have your pharmacy contact our office and allow 72 hours for refills to be completed.   ? ?Today you received the following chemotherapy and/or immunotherapy agents Kyprolis, Darzalex    ?  ?To help prevent nausea and vomiting after your treatment, we encourage you to take your nausea medication as directed. ? ?BELOW ARE SYMPTOMS THAT SHOULD BE REPORTED IMMEDIATELY: ?*FEVER GREATER THAN 100.4 F (38 ?C) OR HIGHER ?*CHILLS OR SWEATING ?*NAUSEA AND VOMITING THAT IS NOT CONTROLLED WITH YOUR NAUSEA MEDICATION ?*UNUSUAL SHORTNESS OF BREATH ?*UNUSUAL BRUISING OR BLEEDING ?*URINARY PROBLEMS (pain or burning when urinating, or frequent urination) ?*BOWEL PROBLEMS (unusual diarrhea, constipation, pain near the anus) ?TENDERNESS IN MOUTH AND THROAT WITH OR WITHOUT PRESENCE OF ULCERS (sore throat, sores in mouth, or a toothache) ?UNUSUAL RASH, SWELLING OR PAIN  ?UNUSUAL VAGINAL DISCHARGE OR ITCHING  ? ?Items with * indicate a potential emergency and should be followed up as soon as possible or go to the Emergency Department if any problems should occur. ? ?Please show the CHEMOTHERAPY ALERT CARD or IMMUNOTHERAPY ALERT CARD at check-in  to the Emergency Department and triage nurse. ?Should you have questions after your visit or need to cancel or reschedule your appointment, please contact [facility]  [phone] and follow the prompts.  Office hours are 8:00 a.m. to 4:30 p.m. Monday - Friday. Please note that voicemails left after 4:00 p.m. may not be returned until the following business day.  We are closed weekends and major holidays. You have access to a nurse at all times for urgent questions. Please call the main number to the clinic [phone] and follow the prompts. ? ?For any non-urgent questions, you may also contact your provider using MyChart. We now offer e-Visits for anyone 78 and older to request care online for non-urgent symptoms. For details visit [email or web address]. ?  ?Also download the MyChart app! Go to the app store, search "MyChart", open the app, select [facility], and log in with your MyChart username and password. ? ?Due to Covid, a mask is required upon entering the hospital/clinic. If you do not have a mask, one will be given to you upon arrival. For doctor visits, patients may have 1 support person aged 95 or older with them. For treatment visits, patients cannot have anyone with them due to current Covid guidelines and our immunocompromised population.  ?

## 2021-10-18 LAB — KAPPA/LAMBDA LIGHT CHAINS
Kappa free light chain: 12.5 mg/L (ref 3.3–19.4)
Kappa, lambda light chain ratio: >8.33 — ABNORMAL HIGH (ref 0.26–1.65)
Lambda free light chains: <1.5 mg/L — ABNORMAL LOW (ref 5.7–26.3)

## 2021-10-18 LAB — IGG, IGA, IGM
IgA: <5 mg/dL — ABNORMAL LOW (ref 64–422)
IgG (Immunoglobin G), Serum: 554 mg/dL — ABNORMAL LOW (ref 586–1602)
IgM (Immunoglobulin M), Srm: 8 mg/dL — ABNORMAL LOW (ref 26–217)

## 2021-10-19 ENCOUNTER — Telehealth: Payer: Self-pay | Admitting: *Deleted

## 2021-10-19 NOTE — Telephone Encounter (Signed)
Patient notified per order of Dr. Marin Olp that "the light chain went down from 20 down to 12.  This is so great.  I am so happy for her.  Pete"  Pt is appreciative of call and has no questions or concerns at this time.  ? ? ?

## 2021-10-19 NOTE — Telephone Encounter (Signed)
-----   Message from [name], MD sent at 10/18/2021  4:37 PM EDT ----- ?Please call let him know that the light chain went down from 20 down to 12.  This is so great.  I am so happy for her.  Pete ?

## 2021-10-23 ENCOUNTER — Other Ambulatory Visit: Payer: Self-pay | Admitting: Hematology & Oncology

## 2021-10-23 ENCOUNTER — Telehealth: Payer: Self-pay | Admitting: *Deleted

## 2021-10-23 LAB — PROTEIN ELECTROPHORESIS, SERUM, WITH REFLEX
A/G Ratio: 1.5 (ref 0.7–1.7)
Albumin ELP: 3.6 g/dL (ref 2.9–4.4)
Alpha-1-Globulin: 0.2 g/dL (ref 0.0–0.4)
Alpha-2-Globulin: 0.8 g/dL (ref 0.4–1.0)
Beta Globulin: 0.9 g/dL (ref 0.7–1.3)
Gamma Globulin: 0.5 g/dL (ref 0.4–1.8)
Globulin, Total: 2.4 g/dL (ref 2.2–3.9)
M-Spike, %: 0.4 g/dL — ABNORMAL HIGH
SPEP Interpretation: 0
Total Protein ELP: 6 g/dL (ref 6.0–8.5)

## 2021-10-23 LAB — IMMUNOFIXATION REFLEX, SERUM
IgA: <5 mg/dL — ABNORMAL LOW (ref 64–422)
IgG (Immunoglobin G), Serum: 630 mg/dL (ref 586–1602)
IgM (Immunoglobulin M), Srm: 9 mg/dL — ABNORMAL LOW (ref 26–217)

## 2021-10-23 NOTE — Telephone Encounter (Signed)
-----   Message from [name], MD sent at 10/23/2021 12:44 PM EDT ----- ?Call - the myeloma is still holding stable!!  Pete ?

## 2021-10-23 NOTE — Telephone Encounter (Signed)
As noted below by Dr. Marin Olp, I informed her that the Myeloma is still holding stable! She verbalized understanding. ?

## 2021-11-07 DIAGNOSIS — M19011 Primary osteoarthritis, right shoulder: Secondary | ICD-10-CM | POA: Diagnosis not present

## 2021-11-14 ENCOUNTER — Inpatient Hospital Stay: Payer: PPO | Attending: Hematology & Oncology

## 2021-11-14 ENCOUNTER — Inpatient Hospital Stay (HOSPITAL_BASED_OUTPATIENT_CLINIC_OR_DEPARTMENT_OTHER): Payer: PPO | Admitting: Hematology & Oncology

## 2021-11-14 ENCOUNTER — Other Ambulatory Visit: Payer: Self-pay

## 2021-11-14 ENCOUNTER — Inpatient Hospital Stay: Payer: PPO

## 2021-11-14 VITALS — BP 134/56 | HR 78 | Temp 98.4°F | Resp 20 | Wt 108.0 lb

## 2021-11-14 DIAGNOSIS — Z5112 Encounter for antineoplastic immunotherapy: Secondary | ICD-10-CM | POA: Diagnosis not present

## 2021-11-14 DIAGNOSIS — Z79899 Other long term (current) drug therapy: Secondary | ICD-10-CM | POA: Diagnosis not present

## 2021-11-14 DIAGNOSIS — D509 Iron deficiency anemia, unspecified: Secondary | ICD-10-CM | POA: Insufficient documentation

## 2021-11-14 DIAGNOSIS — C9000 Multiple myeloma not having achieved remission: Secondary | ICD-10-CM

## 2021-11-14 LAB — CBC WITH DIFFERENTIAL (CANCER CENTER ONLY)
Abs Immature Granulocytes: 0.08 10*3/uL — ABNORMAL HIGH (ref 0.00–0.07)
Basophils Absolute: 0 10*3/uL (ref 0.0–0.1)
Basophils Relative: 0 %
Eosinophils Absolute: 0 10*3/uL (ref 0.0–0.5)
Eosinophils Relative: 0 %
HCT: 43 % (ref 36.0–46.0)
Hemoglobin: 14.5 g/dL (ref 12.0–15.0)
Immature Granulocytes: 1 %
Lymphocytes Relative: 14 %
Lymphs Abs: 1 10*3/uL (ref 0.7–4.0)
MCH: 34.7 pg — ABNORMAL HIGH (ref 26.0–34.0)
MCHC: 33.7 g/dL (ref 30.0–36.0)
MCV: 102.9 fL — ABNORMAL HIGH (ref 80.0–100.0)
Monocytes Absolute: 0.7 10*3/uL (ref 0.1–1.0)
Monocytes Relative: 10 %
Neutro Abs: 5.5 10*3/uL (ref 1.7–7.7)
Neutrophils Relative %: 75 %
Platelet Count: 213 10*3/uL (ref 150–400)
RBC: 4.18 MIL/uL (ref 3.87–5.11)
RDW: 12.5 % (ref 11.5–15.5)
WBC Count: 7.3 10*3/uL (ref 4.0–10.5)
nRBC: 0 % (ref 0.0–0.2)

## 2021-11-14 LAB — CMP (CANCER CENTER ONLY)
ALT: 21 U/L (ref 0–44)
AST: 14 U/L — ABNORMAL LOW (ref 15–41)
Albumin: 4.4 g/dL (ref 3.5–5.0)
Alkaline Phosphatase: 63 U/L (ref 38–126)
Anion gap: 10 (ref 5–15)
BUN: 27 mg/dL — ABNORMAL HIGH (ref 8–23)
CO2: 27 mmol/L (ref 22–32)
Calcium: 9.2 mg/dL (ref 8.9–10.3)
Chloride: 103 mmol/L (ref 98–111)
Creatinine: 0.84 mg/dL (ref 0.44–1.00)
GFR, Estimated: >60 mL/min (ref >60)
Glucose, Bld: 113 mg/dL — ABNORMAL HIGH (ref 70–99)
Potassium: 3.6 mmol/L (ref 3.5–5.1)
Sodium: 140 mmol/L (ref 135–145)
Total Bilirubin: 0.7 mg/dL (ref 0.3–1.2)
Total Protein: 6.6 g/dL (ref 6.5–8.1)

## 2021-11-14 LAB — LACTATE DEHYDROGENASE: LDH: 195 U/L — ABNORMAL HIGH (ref 98–192)

## 2021-11-14 MED ORDER — SODIUM CHLORIDE 0.9 % IV SOLN
40.0000 mg | Freq: Once | INTRAVENOUS | Status: AC
  Administered 2021-11-14: 40 mg via INTRAVENOUS
  Filled 2021-11-14: qty 4

## 2021-11-14 MED ORDER — SODIUM CHLORIDE 0.9 % IV SOLN: Freq: Once | INTRAVENOUS | Status: DC

## 2021-11-14 MED ORDER — DIPHENHYDRAMINE HCL 25 MG PO CAPS: 25.0000 mg | ORAL_CAPSULE | Freq: Once | ORAL | Status: DC

## 2021-11-14 MED ORDER — DEXTROSE 5 % IV SOLN
70.0000 mg/m2 | Freq: Once | INTRAVENOUS | Status: AC
  Administered 2021-11-14: 100 mg via INTRAVENOUS
  Filled 2021-11-14: qty 30

## 2021-11-14 MED ORDER — DENOSUMAB 120 MG/1.7ML ~~LOC~~ SOLN: 120.0000 mg | Freq: Once | SUBCUTANEOUS | Status: DC

## 2021-11-14 MED ORDER — SODIUM CHLORIDE 0.9 % IV SOLN: Freq: Once | INTRAVENOUS | Status: AC

## 2021-11-14 MED ORDER — DARATUMUMAB-HYALURONIDASE-FIHJ 1800-30000 MG-UT/15ML ~~LOC~~ SOLN
1800.0000 mg | Freq: Once | SUBCUTANEOUS | Status: AC
  Administered 2021-11-14: 1800 mg via SUBCUTANEOUS
  Filled 2021-11-14: qty 15

## 2021-11-14 MED ORDER — ACETAMINOPHEN 325 MG PO TABS: 650.0000 mg | ORAL_TABLET | Freq: Once | ORAL | Status: DC

## 2021-11-14 NOTE — Progress Notes (Signed)
?Hematology and Oncology Follow Up Visit ? ?Debra Ruiz ?[id number] ?[birth year] 76 y.o. ?11/14/2021 ? ? ?Principle Diagnosis:  ?IgG Kappa myeloma - compression fractures - 13q-, +11, 14q+ - hyperdiploid -- progressive ?Iron def anemia ? ?Completed Therapy: ?S/p Kyphoplasty of T11 and T12 ?XRT to lower spine-completed on 09/25/2016 ? ?Current Therapy:   ? ?Faspro -- start on 04/05/2020 -- s/p cycle #21 -- Kyprolis added on 07/20/2020 -- s/p cycle #21 ?Ninlaro 2.3 mg po q week (3 on/1 off) -- start on 01/24/2020 -- d/c on 03/29/2020 due to toxicity ?Velcade/Pomalyst/Decadron -Start on 09/02/2018 --Pomalyst on hold since 11/09/2018.  D/C on 01/17/2020 ?Xgeva 170m Sq q  3 month - next dose in 11/2021  ?IV Injectafer -- last dose given on 11/2018 ?  ?Interim History:  Debra Ruiz here today for follow-up.  Overall, she seems to be doing pretty well.  She sees a new orthopedic surgeon for her back in a couple weeks.  Hopefully, he will be able to help her out. ? ?She is still having some problems with itching.  Not sure why she would have this.  I suppose that the Faspro in the Kyprolis could certainly be doing this. ? ?They do have a nice Easter.  Thankfully, it sounds like some legal issues with the family are being taken care of. ? ?Her myeloma is holding low and steady.  Her last monoclonal study showed M spike of 0.4 g/dL.  The IgG level was 580 mg/dL.  The Kappa light chain was 1.3 mg/dL. ? ?She is eating okay.  There is no problems with bowels or bladder.  She has had no cough or shortness of breath.  She has had no headache. ? ?Overall, I would say her performance status is probably ECOG 2.   ?  ?Medications:  ?Allergies as of 11/14/2021   ? ?   Reactions  ? Crestor [rosuvastatin Calcium] Other (See Comments)  ? Causes liver functions to be elevated  ? ?  ? ?  ?Medication List  ?  ? ?  ? Accurate as of November 14, 2021  9:53 AM. If you have any questions, ask your nurse or doctor.  ?  ?  ? ?  ? ?STOP taking these  medications   ? ?gabapentin 300 MG capsule ?Commonly known as: NEURONTIN ?Stopped by: PVolanda Napoleon MD ?  ? ?  ? ?TAKE these medications   ? ?ALPRAZolam 0.5 MG tablet ?Commonly known as: XDuanne Moron?Take 1 tablet (0.5 mg total) by mouth 2 (two) times daily as needed for anxiety. ?  ?amLODipine 5 MG tablet ?Commonly known as: NORVASC ?TAKE 1 TABLET(5 MG) BY MOUTH DAILY ?  ?aspirin 81 MG EC tablet ?Take 81 mg by mouth every other day. ?  ?atorvastatin 20 MG tablet ?Commonly known as: LIPITOR ?TAKE 1 TABLET(20 MG) BY MOUTH DAILY ?  ?Belsomra 10 MG Tabs ?Generic drug: Suvorexant ?TAKE 1 TABLET BY MOUTH AT BEDTIME AS NEEDED ?  ?buPROPion 300 MG 24 hr tablet ?Commonly known as: WELLBUTRIN XL ?TAKE 1 TABLET(300 MG) BY MOUTH DAILY ?  ?dexamethasone 4 MG tablet ?Commonly known as: DECADRON ?TAKE 3 TABLETS BY MOUTH 1 TIME A WEEK ?  ?escitalopram 20 MG tablet ?Commonly known as: LEXAPRO ?TAKE 1 TABLET(20 MG) BY MOUTH DAILY ?  ?famciclovir 250 MG tablet ?Commonly known as: FAMVIR ?TAKE 1 TABLET(250 MG) BY MOUTH DAILY ?  ?IRON PO ?Take by mouth daily. ?  ?nebivolol 5 MG tablet ?Commonly known as: Bystolic ?TAKE 1  TABLET(5 MG) BY MOUTH DAILY ?  ?oxybutynin 10 MG 24 hr tablet ?Commonly known as: DITROPAN-XL ?Take 1 tablet (10 mg total) by mouth at bedtime. ?  ?potassium chloride SA 20 MEQ tablet ?Commonly known as: KLOR-CON M ?TAKE 1 TABLET(20 MEQ) BY MOUTH DAILY ?  ?prochlorperazine 10 MG tablet ?Commonly known as: COMPAZINE ?TAKE 1 TABLET(10 MG) BY MOUTH EVERY 6 HOURS AS NEEDED FOR NAUSEA OR VOMITING ?  ?REFRESH DRY EYE THERAPY OP ?Apply to eye 3 (three) times daily. ?  ?Vitamin B-12 5000 MCG Tbdp ?Take by mouth daily. ?  ?Vitamin D 50 MCG (2000 UT) Caps ?Take 2,000 Units by mouth daily. ?  ? ?  ? ? ?Allergies:  ?Allergies  ?Allergen Reactions  ? Crestor [Rosuvastatin Calcium] Other (See Comments)  ?  Causes liver functions to be elevated  ? ? ?Past Medical History, Surgical history, Social history, and Family History were  reviewed and updated. ? ?Review of Systems: ?Review of Systems  ?Constitutional: Negative.   ?HENT: Negative.    ?Eyes: Negative.   ?Respiratory: Negative.    ?Cardiovascular: Negative.   ?Gastrointestinal: Negative.   ?Genitourinary: Negative.   ?Musculoskeletal:  Positive for back pain.  ?Skin: Negative.   ?Neurological: Negative.   ?Endo/Heme/Allergies: Negative.   ?Psychiatric/Behavioral: Negative.    ? ?Physical Exam: ? weight is 108 lb (49 kg). Her oral temperature is 98.4 ?F (36.9 ?C). Her blood pressure is 134/56 (abnormal) and her pulse is 78. Her respiration is 20 and oxygen saturation is 99%.  ? ?Wt Readings from Last 3 Encounters:  ?11/14/21 108 lb (49 kg)  ?10/17/21 110 lb (49.9 kg)  ?10/09/21 107 lb (48.5 kg)  ? ? ?Physical Exam ?Vitals reviewed.  ?HENT:  ?   Head: Normocephalic and atraumatic.  ?Eyes:  ?   Pupils: Pupils are equal, round, and reactive to light.  ?Cardiovascular:  ?   Rate and Rhythm: Normal rate and regular rhythm.  ?   Heart sounds: Normal heart sounds.  ?Pulmonary:  ?   Effort: Pulmonary effort is normal.  ?   Breath sounds: Normal breath sounds.  ?Abdominal:  ?   General: Bowel sounds are normal.  ?   Palpations: Abdomen is soft.  ?Musculoskeletal:     ?   General: No tenderness or deformity. Normal range of motion.  ?   Cervical back: Normal range of motion.  ?Lymphadenopathy:  ?   Cervical: No cervical adenopathy.  ?Skin: ?   General: Skin is warm and dry.  ?   Findings: No erythema or rash.  ?Neurological:  ?   Mental Status: She is alert and oriented to person, place, and time.  ?Psychiatric:     ?   Behavior: Behavior normal.     ?   Thought Content: Thought content normal.     ?   Judgment: Judgment normal.  ? ?  ? ?Lab Results  ?Component Value Date  ? WBC 7.3 11/14/2021  ? HGB 14.5 11/14/2021  ? HCT 43.0 11/14/2021  ? MCV 102.9 (H) 11/14/2021  ? PLT 213 11/14/2021  ? ?Lab Results  ?Component Value Date  ? FERRITIN 258 10/25/2020  ? IRON 136 10/25/2020  ? TIBC 353 10/25/2020   ? UIBC 216 10/25/2020  ? IRONPCTSAT 39 10/25/2020  ? ?Lab Results  ?Component Value Date  ? RBC 4.18 11/14/2021  ? ?Lab Results  ?Component Value Date  ? KPAFRELGTCHN 12.5 10/17/2021  ? LAMBDASER <1.5 (L) 10/17/2021  ? KAPLAMBRATIO >8.33 (H) 10/17/2021  ? ?  Lab Results  ?Component Value Date  ? IGGSERUM 554 (L) 10/17/2021  ? IGGSERUM 630 10/17/2021  ? IGA <5 (L) 10/17/2021  ? IGA <5 (L) 10/17/2021  ? IGMSERUM 8 (L) 10/17/2021  ? IGMSERUM 9 (L) 10/17/2021  ? ?Lab Results  ?Component Value Date  ? TOTALPROTELP 6.0 10/17/2021  ? ALBUMINELP 3.6 10/17/2021  ? A1GS 0.2 10/17/2021  ? A2GS 0.8 10/17/2021  ? BETS 0.9 10/17/2021  ? GAMS 0.5 10/17/2021  ? MSPIKE 0.4 (H) 10/17/2021  ? SPEI Comment 01/28/2018  ? ?  Chemistry   ?   ?Component Value Date/Time  ? NA 140 11/14/2021 0907  ? NA 148 (H) 07/16/2017 1128  ? NA 143 03/05/2017 1051  ? K 3.6 11/14/2021 0907  ? K 3.8 07/16/2017 1128  ? K 3.7 03/05/2017 1051  ? CL 103 11/14/2021 0907  ? CL 106 07/16/2017 1128  ? CO2 27 11/14/2021 0907  ? CO2 29 07/16/2017 1128  ? CO2 25 03/05/2017 1051  ? BUN 27 (H) 11/14/2021 1117  ? BUN 23 (H) 07/16/2017 1128  ? BUN 18.2 03/05/2017 1051  ? CREATININE 0.84 11/14/2021 0907  ? CREATININE 0.75 06/20/2020 1127  ? CREATININE 0.8 03/05/2017 1051  ?    ?Component Value Date/Time  ? CALCIUM 9.2 11/14/2021 0907  ? CALCIUM 9.7 07/16/2017 1128  ? CALCIUM 8.9 03/05/2017 1051  ? ALKPHOS 63 11/14/2021 0907  ? ALKPHOS 71 07/16/2017 1128  ? ALKPHOS 67 03/05/2017 1051  ? AST 14 (L) 11/14/2021 3567  ? AST 15 03/05/2017 1051  ? ALT 21 11/14/2021 0907  ? ALT 23 07/16/2017 1128  ? ALT 17 03/05/2017 1051  ? BILITOT 0.7 11/14/2021 0907  ? BILITOT 0.55 03/05/2017 1051  ?  ? ?   ?Impression and Plan: Ms. Cannell is a very pleasant 76 yo caucasian female with IgG Kappa Myeloma.  ? ?Thankfully, everything still holding nice and steady.  I know she has issues at home.  Her husband seems to be doing a little bit better with his Alzheimer's. ? ?He will be interesting to  see what the spinal surgeon says about her lower back.  Again I would be surprised if there is any operative intervention that she could undertake. ? ?We will continue her on the Kyprolis and Faspro.  We will

## 2021-11-14 NOTE — Patient Instructions (Signed)
[facility]  Discharge Instructions: ?Thank you for choosing [facility] to provide your oncology and hematology care.  ? ?If you have a lab appointment with the [facility], please go directly to the [facility] and check in at the registration area. ? ?Wear comfortable clothing and clothing appropriate for easy access to any Portacath or PICC line.  ? ?We strive to give you quality time with your provider. You may need to reschedule your appointment if you arrive late (15 or more minutes).  Arriving late affects you and other patients whose appointments are after yours.  Also, if you miss three or more appointments without notifying the office, you may be dismissed from the clinic at the provider?s discretion.    ?  ?For prescription refill requests, have your pharmacy contact our office and allow 72 hours for refills to be completed.   ? ?Today you received the following chemotherapy and/or immunotherapy agents Kyprolis, Darzalex    ?  ?To help prevent nausea and vomiting after your treatment, we encourage you to take your nausea medication as directed. ? ?BELOW ARE SYMPTOMS THAT SHOULD BE REPORTED IMMEDIATELY: ?*FEVER GREATER THAN 100.4 F (38 ?C) OR HIGHER ?*CHILLS OR SWEATING ?*NAUSEA AND VOMITING THAT IS NOT CONTROLLED WITH YOUR NAUSEA MEDICATION ?*UNUSUAL SHORTNESS OF BREATH ?*UNUSUAL BRUISING OR BLEEDING ?*URINARY PROBLEMS (pain or burning when urinating, or frequent urination) ?*BOWEL PROBLEMS (unusual diarrhea, constipation, pain near the anus) ?TENDERNESS IN MOUTH AND THROAT WITH OR WITHOUT PRESENCE OF ULCERS (sore throat, sores in mouth, or a toothache) ?UNUSUAL RASH, SWELLING OR PAIN  ?UNUSUAL VAGINAL DISCHARGE OR ITCHING  ? ?Items with * indicate a potential emergency and should be followed up as soon as possible or go to the Emergency Department if any problems should occur. ? ?Please show the CHEMOTHERAPY ALERT CARD or IMMUNOTHERAPY ALERT CARD at check-in  to the Emergency Department and triage nurse. ?Should you have questions after your visit or need to cancel or reschedule your appointment, please contact [facility]  [phone] and follow the prompts.  Office hours are 8:00 a.m. to 4:30 p.m. Monday - Friday. Please note that voicemails left after 4:00 p.m. may not be returned until the following business day.  We are closed weekends and major holidays. You have access to a nurse at all times for urgent questions. Please call the main number to the clinic [phone] and follow the prompts. ? ?For any non-urgent questions, you may also contact your provider using MyChart. We now offer e-Visits for anyone 64 and older to request care online for non-urgent symptoms. For details visit [email or web address]. ?  ?Also download the MyChart app! Go to the app store, search "MyChart", open the app, select [facility], and log in with your MyChart username and password. ? ?Due to Covid, a mask is required upon entering the hospital/clinic. If you do not have a mask, one will be given to you upon arrival. For doctor visits, patients may have 1 support person aged 37 or older with them. For treatment visits, patients cannot have anyone with them due to current Covid guidelines and our immunocompromised population.  ?

## 2021-11-15 LAB — KAPPA/LAMBDA LIGHT CHAINS
Kappa free light chain: 9.5 mg/L (ref 3.3–19.4)
Kappa, lambda light chain ratio: >6.33 — ABNORMAL HIGH (ref 0.26–1.65)
Lambda free light chains: <1.5 mg/L — ABNORMAL LOW (ref 5.7–26.3)

## 2021-11-15 LAB — IGG, IGA, IGM
IgA: <5 mg/dL — ABNORMAL LOW (ref 64–422)
IgG (Immunoglobin G), Serum: 539 mg/dL — ABNORMAL LOW (ref 586–1602)
IgM (Immunoglobulin M), Srm: 21 mg/dL — ABNORMAL LOW (ref 26–217)

## 2021-11-15 LAB — BETA 2 MICROGLOBULIN, SERUM: Beta-2 Microglobulin: 1.5 mg/L (ref 0.6–2.4)

## 2021-11-16 ENCOUNTER — Telehealth: Payer: Self-pay | Admitting: *Deleted

## 2021-11-16 ENCOUNTER — Encounter: Payer: Self-pay | Admitting: *Deleted

## 2021-11-16 NOTE — Telephone Encounter (Signed)
Pt notified per order of Dr. Marin Olp that "the kappa light chain is still going down!!  [place] job!!  Pete"  Pt appreciative of call and has no questions or concerns at this time.  ? ? ?

## 2021-11-16 NOTE — Telephone Encounter (Signed)
-----   Message from [name], MD sent at 11/15/2021  4:12 PM EDT ----- ?Call - the kappa light chin is still going down!!  [place] job!!  [place] ?

## 2021-11-19 LAB — PROTEIN ELECTROPHORESIS, SERUM, WITH REFLEX
A/G Ratio: 1.7 (ref 0.7–1.7)
Albumin ELP: 4 g/dL (ref 2.9–4.4)
Alpha-1-Globulin: 0.2 g/dL (ref 0.0–0.4)
Alpha-2-Globulin: 0.7 g/dL (ref 0.4–1.0)
Beta Globulin: 0.9 g/dL (ref 0.7–1.3)
Gamma Globulin: 0.5 g/dL (ref 0.4–1.8)
Globulin, Total: 2.3 g/dL (ref 2.2–3.9)
M-Spike, %: 0.3 g/dL — ABNORMAL HIGH
SPEP Interpretation: 0
Total Protein ELP: 6.3 g/dL (ref 6.0–8.5)

## 2021-11-19 LAB — IMMUNOFIXATION REFLEX, SERUM
IgA: <5 mg/dL — ABNORMAL LOW (ref 64–422)
IgG (Immunoglobin G), Serum: 603 mg/dL (ref 586–1602)
IgM (Immunoglobulin M), Srm: 24 mg/dL — ABNORMAL LOW (ref 26–217)

## 2021-11-27 DIAGNOSIS — M5451 Vertebrogenic low back pain: Secondary | ICD-10-CM | POA: Diagnosis not present

## 2021-12-03 DIAGNOSIS — H04123 Dry eye syndrome of bilateral lacrimal glands: Secondary | ICD-10-CM | POA: Diagnosis not present

## 2021-12-03 DIAGNOSIS — Z961 Presence of intraocular lens: Secondary | ICD-10-CM | POA: Diagnosis not present

## 2021-12-03 DIAGNOSIS — H11823 Conjunctivochalasis, bilateral: Secondary | ICD-10-CM | POA: Diagnosis not present

## 2021-12-03 DIAGNOSIS — H16233 Neurotrophic keratoconjunctivitis, bilateral: Secondary | ICD-10-CM | POA: Diagnosis not present

## 2021-12-12 ENCOUNTER — Inpatient Hospital Stay: Payer: PPO

## 2021-12-12 ENCOUNTER — Inpatient Hospital Stay: Payer: PPO | Attending: Hematology & Oncology

## 2021-12-12 ENCOUNTER — Inpatient Hospital Stay (HOSPITAL_BASED_OUTPATIENT_CLINIC_OR_DEPARTMENT_OTHER): Payer: PPO | Admitting: Hematology & Oncology

## 2021-12-12 ENCOUNTER — Encounter: Payer: Self-pay | Admitting: Hematology & Oncology

## 2021-12-12 VITALS — BP 143/78 | HR 82 | Temp 97.5°F | Resp 20 | Wt 108.0 lb

## 2021-12-12 DIAGNOSIS — C9000 Multiple myeloma not having achieved remission: Secondary | ICD-10-CM

## 2021-12-12 DIAGNOSIS — Z79899 Other long term (current) drug therapy: Secondary | ICD-10-CM | POA: Diagnosis not present

## 2021-12-12 DIAGNOSIS — Z5112 Encounter for antineoplastic immunotherapy: Secondary | ICD-10-CM | POA: Diagnosis not present

## 2021-12-12 LAB — CMP (CANCER CENTER ONLY)
ALT: 15 U/L (ref 0–44)
AST: 15 U/L (ref 15–41)
Albumin: 4.4 g/dL (ref 3.5–5.0)
Alkaline Phosphatase: 67 U/L (ref 38–126)
Anion gap: 9 (ref 5–15)
BUN: 30 mg/dL — ABNORMAL HIGH (ref 8–23)
CO2: 25 mmol/L (ref 22–32)
Calcium: 9.6 mg/dL (ref 8.9–10.3)
Chloride: 106 mmol/L (ref 98–111)
Creatinine: 0.74 mg/dL (ref 0.44–1.00)
GFR, Estimated: >60 mL/min (ref >60)
Glucose, Bld: 88 mg/dL (ref 70–99)
Potassium: 4.1 mmol/L (ref 3.5–5.1)
Sodium: 140 mmol/L (ref 135–145)
Total Bilirubin: 0.5 mg/dL (ref 0.3–1.2)
Total Protein: 6.6 g/dL (ref 6.5–8.1)

## 2021-12-12 LAB — CBC WITH DIFFERENTIAL (CANCER CENTER ONLY)
Abs Immature Granulocytes: 0.04 10*3/uL (ref 0.00–0.07)
Basophils Absolute: 0 10*3/uL (ref 0.0–0.1)
Basophils Relative: 0 %
Eosinophils Absolute: 0 10*3/uL (ref 0.0–0.5)
Eosinophils Relative: 0 %
HCT: 40.3 % (ref 36.0–46.0)
Hemoglobin: 13.6 g/dL (ref 12.0–15.0)
Immature Granulocytes: 1 %
Lymphocytes Relative: 20 %
Lymphs Abs: 1.2 10*3/uL (ref 0.7–4.0)
MCH: 35.3 pg — ABNORMAL HIGH (ref 26.0–34.0)
MCHC: 33.7 g/dL (ref 30.0–36.0)
MCV: 104.7 fL — ABNORMAL HIGH (ref 80.0–100.0)
Monocytes Absolute: 0.7 10*3/uL (ref 0.1–1.0)
Monocytes Relative: 11 %
Neutro Abs: 4.2 10*3/uL (ref 1.7–7.7)
Neutrophils Relative %: 68 %
Platelet Count: 267 10*3/uL (ref 150–400)
RBC: 3.85 MIL/uL — ABNORMAL LOW (ref 3.87–5.11)
RDW: 13 % (ref 11.5–15.5)
WBC Count: 6.2 10*3/uL (ref 4.0–10.5)
nRBC: 0 % (ref 0.0–0.2)

## 2021-12-12 LAB — LACTATE DEHYDROGENASE: LDH: 214 U/L — ABNORMAL HIGH (ref 98–192)

## 2021-12-12 MED ORDER — ACETAMINOPHEN 325 MG PO TABS: 650.0000 mg | ORAL_TABLET | Freq: Once | ORAL | Status: DC

## 2021-12-12 MED ORDER — SODIUM CHLORIDE 0.9 % IV SOLN: Freq: Once | INTRAVENOUS | Status: AC

## 2021-12-12 MED ORDER — DARATUMUMAB-HYALURONIDASE-FIHJ 1800-30000 MG-UT/15ML ~~LOC~~ SOLN
1800.0000 mg | Freq: Once | SUBCUTANEOUS | Status: AC
  Administered 2021-12-12: 1800 mg via SUBCUTANEOUS
  Filled 2021-12-12: qty 15

## 2021-12-12 MED ORDER — DIPHENHYDRAMINE HCL 25 MG PO CAPS: 25.0000 mg | ORAL_CAPSULE | Freq: Once | ORAL | Status: DC

## 2021-12-12 MED ORDER — DENOSUMAB 120 MG/1.7ML ~~LOC~~ SOLN
120.0000 mg | Freq: Once | SUBCUTANEOUS | Status: AC
  Administered 2021-12-12: 120 mg via SUBCUTANEOUS
  Filled 2021-12-12: qty 1.7

## 2021-12-12 MED ORDER — SODIUM CHLORIDE 0.9 % IV SOLN
40.0000 mg | Freq: Once | INTRAVENOUS | Status: AC
  Administered 2021-12-12: 40 mg via INTRAVENOUS
  Filled 2021-12-12: qty 4

## 2021-12-12 MED ORDER — DEXTROSE 5 % IV SOLN
70.0000 mg/m2 | Freq: Once | INTRAVENOUS | Status: AC
  Administered 2021-12-12: 100 mg via INTRAVENOUS
  Filled 2021-12-12: qty 30

## 2021-12-12 MED ORDER — ZOLPIDEM TARTRATE 10 MG PO TABS: 10.0000 mg | ORAL_TABLET | Freq: Every day | ORAL | 0 refills | Status: DC

## 2021-12-12 NOTE — Patient Instructions (Signed)
[facility]  Discharge Instructions: Thank you for choosing [facility] to provide your oncology and hematology care.   If you have a lab appointment with the [facility], please go directly to the [facility] and check in at the registration area.  Wear comfortable clothing and clothing appropriate for easy access to any Portacath or PICC line.   We strive to give you quality time with your provider. You may need to reschedule your appointment if you arrive late (15 or more minutes).  Arriving late affects you and other patients whose appointments are after yours.  Also, if you miss three or more appointments without notifying the office, you may be dismissed from the clinic at the provider's discretion.      For prescription refill requests, have your pharmacy contact our office and allow 72 hours for refills to be completed.    Today you received the following chemotherapy and/or immunotherapy agents darzalex, xgeva, kyprolis     To help prevent nausea and vomiting after your treatment, we encourage you to take your nausea medication as directed.  BELOW ARE SYMPTOMS THAT SHOULD BE REPORTED IMMEDIATELY: *FEVER GREATER THAN 100.4 F (38 C) OR HIGHER *CHILLS OR SWEATING *NAUSEA AND VOMITING THAT IS NOT CONTROLLED WITH YOUR NAUSEA MEDICATION *UNUSUAL SHORTNESS OF BREATH *UNUSUAL BRUISING OR BLEEDING *URINARY PROBLEMS (pain or burning when urinating, or frequent urination) *BOWEL PROBLEMS (unusual diarrhea, constipation, pain near the anus) TENDERNESS IN MOUTH AND THROAT WITH OR WITHOUT PRESENCE OF ULCERS (sore throat, sores in mouth, or a toothache) UNUSUAL RASH, SWELLING OR PAIN  UNUSUAL VAGINAL DISCHARGE OR ITCHING   Items with * indicate a potential emergency and should be followed up as soon as possible or go to the Emergency Department if any problems should occur.  Please show the CHEMOTHERAPY ALERT CARD or IMMUNOTHERAPY ALERT CARD at  check-in to the Emergency Department and triage nurse. Should you have questions after your visit or need to cancel or reschedule your appointment, please contact [facility]  [phone] and follow the prompts.  Office hours are 8:00 a.m. to 4:30 p.m. Monday - Friday. Please note that voicemails left after 4:00 p.m. may not be returned until the following business day.  We are closed weekends and major holidays. You have access to a nurse at all times for urgent questions. Please call the main number to the clinic [phone] and follow the prompts.  For any non-urgent questions, you may also contact your provider using MyChart. We now offer e-Visits for anyone 19 and older to request care online for non-urgent symptoms. For details visit [email or web address].   Also download the MyChart app! Go to the app store, search "MyChart", open the app, select [facility], and log in with your MyChart username and password.  Due to Covid, a mask is required upon entering the hospital/clinic. If you do not have a mask, one will be given to you upon arrival. For doctor visits, patients may have 1 support person aged 76 or older with them. For treatment visits, patients cannot have anyone with them due to current Covid guidelines and our immunocompromised population.

## 2021-12-12 NOTE — Progress Notes (Signed)
Hematology and Oncology Follow Up Visit  Debra Ruiz [id number] [birth year] 76 y.o. 12/12/2021   Principle Diagnosis:  IgG Kappa myeloma - compression fractures - 13q-, +11, 14q+ - hyperdiploid -- progressive Iron def anemia  Completed Therapy: S/p Kyphoplasty of T11 and T12 XRT to lower spine-completed on 09/25/2016  Current Therapy:    Faspro -- start on 04/05/2020 -- s/p cycle #23 -- Kyprolis added on 07/20/2020 -- s/p cycle #21 Ninlaro 2.3 mg po q week (3 on/1 off) -- start on 01/24/2020 -- d/c on 03/29/2020 due to toxicity Velcade/Pomalyst/Decadron -Start on 09/02/2018 --Pomalyst on hold since 11/09/2018.  D/C on 01/17/2020 Xgeva 154m Sq q  3 month - next dose in 02/2022  IV Injectafer -- last dose given on 11/2018   Interim History:  Debra Ruiz here today for follow-up.  She seems to be managing pretty well.  She had surgery for her right eye last week.  She apparently had a "film" over the eye.  The ophthalmologist took her to surgery and got rid of it.  She is able to see a little bit better.  She has seen orthopedic surgery for her spine.  I think she may go back to for some physical therapy.  She has had a decent appetite.  She has had no nausea or vomiting.  She has had no cough.  She has had no bleeding.  There is no change in bowel or bladder habits.  Her last myeloma studies showed an M spike of 0.3 g/dL.  Her IgG level was 539 mg/dL.  The Kappa light chain was o 1.0 mg/dL.  Overall, I would have to say that her performance status is probably ECOG 1.     Medications:  Allergies as of 12/12/2021       Reactions   Crestor [rosuvastatin Calcium] Other (See Comments)   Causes liver functions to be elevated        Medication List        Accurate as of Dec 12, 2021  9:37 AM. If you have any questions, ask your nurse or doctor.          ALPRAZolam 0.5 MG tablet Commonly known as: XANAX Take 1 tablet (0.5 mg total) by mouth 2 (two) times daily as  needed for anxiety.   amLODipine 5 MG tablet Commonly known as: NORVASC TAKE 1 TABLET(5 MG) BY MOUTH DAILY   aspirin EC 81 MG tablet Take 81 mg by mouth every other day.   atorvastatin 20 MG tablet Commonly known as: LIPITOR TAKE 1 TABLET(20 MG) BY MOUTH DAILY   Belsomra 10 MG Tabs Generic drug: Suvorexant TAKE 1 TABLET BY MOUTH AT BEDTIME AS NEEDED   buPROPion 300 MG 24 hr tablet Commonly known as: WELLBUTRIN XL TAKE 1 TABLET(300 MG) BY MOUTH DAILY   dexamethasone 4 MG tablet Commonly known as: DECADRON TAKE 3 TABLETS BY MOUTH 1 TIME A WEEK   escitalopram 20 MG tablet Commonly known as: LEXAPRO TAKE 1 TABLET(20 MG) BY MOUTH DAILY   famciclovir 250 MG tablet Commonly known as: FAMVIR TAKE 1 TABLET(250 MG) BY MOUTH DAILY   Flarex 0.1 % ophthalmic suspension Generic drug: fluorometholone Flarex 0.1 % eye drops,suspension  Instill ONE drop into BOTH eyes FOUR times A DAY   IRON PO Take by mouth daily.   nebivolol 5 MG tablet Commonly known as: Bystolic TAKE 1 TABLET(5 MG) BY MOUTH DAILY   Oxervate 0.002 % Soln Generic drug: Cenegermin-bkbj SMARTSIG:1 Drop(s) In Eye(s) 6 Times  Daily   oxybutynin 10 MG 24 hr tablet Commonly known as: DITROPAN-XL Take 1 tablet (10 mg total) by mouth at bedtime.   oxyCODONE-acetaminophen 5-325 MG tablet Commonly known as: PERCOCET/ROXICET Take 1 tablet by mouth every 4 (four) hours as needed.   potassium chloride SA 20 MEQ tablet Commonly known as: KLOR-CON M TAKE 1 TABLET(20 MEQ) BY MOUTH DAILY   prochlorperazine 10 MG tablet Commonly known as: COMPAZINE TAKE 1 TABLET(10 MG) BY MOUTH EVERY 6 HOURS AS NEEDED FOR NAUSEA OR VOMITING   REFRESH DRY EYE THERAPY OP Apply to eye 3 (three) times daily.   Vitamin B-12 5000 MCG Tbdp Take by mouth daily.   Vitamin D 50 MCG (2000 UT) Caps Take 2,000 Units by mouth daily.   zolpidem 10 MG tablet Commonly known as: AMBIEN Take 10 mg by mouth at bedtime.        Allergies:   Allergies  Allergen Reactions   Crestor [Rosuvastatin Calcium] Other (See Comments)    Causes liver functions to be elevated    Past Medical History, Surgical history, Social history, and Family History were reviewed and updated.  Review of Systems: Review of Systems  Constitutional: Negative.   HENT: Negative.    Eyes: Negative.   Respiratory: Negative.    Cardiovascular: Negative.   Gastrointestinal: Negative.   Genitourinary: Negative.   Musculoskeletal:  Positive for back pain.  Skin: Negative.   Neurological: Negative.   Endo/Heme/Allergies: Negative.   Psychiatric/Behavioral: Negative.     Physical Exam:  weight is 108 lb (49 kg). Her oral temperature is 97.5 F (36.4 C) (abnormal). Her blood pressure is 143/78 (abnormal) and her pulse is 82. Her respiration is 20 and oxygen saturation is 99%.   Wt Readings from Last 3 Encounters:  12/12/21 108 lb (49 kg)  11/14/21 108 lb (49 kg)  10/17/21 110 lb (49.9 kg)    Physical Exam Vitals reviewed.  HENT:     Head: Normocephalic and atraumatic.  Eyes:     Pupils: Pupils are equal, round, and reactive to light.  Cardiovascular:     Rate and Rhythm: Normal rate and regular rhythm.     Heart sounds: Normal heart sounds.  Pulmonary:     Effort: Pulmonary effort is normal.     Breath sounds: Normal breath sounds.  Abdominal:     General: Bowel sounds are normal.     Palpations: Abdomen is soft.  Musculoskeletal:        General: No tenderness or deformity. Normal range of motion.     Cervical back: Normal range of motion.  Lymphadenopathy:     Cervical: No cervical adenopathy.  Skin:    General: Skin is warm and dry.     Findings: No erythema or rash.  Neurological:     Mental Status: She is alert and oriented to person, place, and time.  Psychiatric:        Behavior: Behavior normal.        Thought Content: Thought content normal.        Judgment: Judgment normal.      Lab Results  Component Value Date    WBC 6.2 12/12/2021   HGB 13.6 12/12/2021   HCT 40.3 12/12/2021   MCV 104.7 (H) 12/12/2021   PLT 267 12/12/2021   Lab Results  Component Value Date   FERRITIN 258 10/25/2020   IRON 136 10/25/2020   TIBC 353 10/25/2020   UIBC 216 10/25/2020   IRONPCTSAT 39 10/25/2020   Lab Results  Component Value Date   RBC 3.85 (L) 12/12/2021   Lab Results  Component Value Date   KPAFRELGTCHN 9.5 11/14/2021   LAMBDASER <1.5 (L) 11/14/2021   KAPLAMBRATIO >6.33 (H) 11/14/2021   Lab Results  Component Value Date   IGGSERUM 539 (L) 11/14/2021   IGA <5 (L) 11/14/2021   IGMSERUM 21 (L) 11/14/2021   Lab Results  Component Value Date   TOTALPROTELP 6.3 11/14/2021   ALBUMINELP 4.0 11/14/2021   A1GS 0.2 11/14/2021   A2GS 0.7 11/14/2021   BETS 0.9 11/14/2021   GAMS 0.5 11/14/2021   MSPIKE 0.3 (H) 11/14/2021   SPEI Comment 01/28/2018     Chemistry      Component Value Date/Time   NA 140 12/12/2021 0847   NA 148 (H) 07/16/2017 1128   NA 143 03/05/2017 1051   K 4.1 12/12/2021 0847   K 3.8 07/16/2017 1128   K 3.7 03/05/2017 1051   CL 106 12/12/2021 0847   CL 106 07/16/2017 1128   CO2 25 12/12/2021 0847   CO2 29 07/16/2017 1128   CO2 25 03/05/2017 1051   BUN 30 (H) 12/12/2021 0847   BUN 23 (H) 07/16/2017 1128   BUN 18.2 03/05/2017 1051   CREATININE 0.74 12/12/2021 0847   CREATININE 0.75 06/20/2020 1127   CREATININE 0.8 03/05/2017 1051      Component Value Date/Time   CALCIUM 9.6 12/12/2021 0847   CALCIUM 9.7 07/16/2017 1128   CALCIUM 8.9 03/05/2017 1051   ALKPHOS 67 12/12/2021 0847   ALKPHOS 71 07/16/2017 1128   ALKPHOS 67 03/05/2017 1051   AST 15 12/12/2021 0847   AST 15 03/05/2017 1051   ALT 15 12/12/2021 0847   ALT 23 07/16/2017 1128   ALT 17 03/05/2017 1051   BILITOT 0.5 12/12/2021 0847   BILITOT 0.55 03/05/2017 1051        Impression and Plan: Ms. Quaranta is a very pleasant 76 yo caucasian female with IgG Kappa Myeloma.   Thankfully, everything still holding nice  and steady.  I know she has issues at home.  Her husband seems to be doing a little bit better with his Alzheimer's.  I am just glad that she seems to be doing pretty well.  Her back does not seem to be bothering her as much.  Hopefully, she will be able to get some exercises for this.  It is nice to see that the myeloma is at least holding steady.  Her myeloma studies certainly look quite good and low.  She will get her Delton See today.  We will continue her on the 96-monthintervals.  She enjoys this.    PVolanda Napoleon MD 5/24/20239:37 AM

## 2021-12-13 DIAGNOSIS — M5451 Vertebrogenic low back pain: Secondary | ICD-10-CM | POA: Diagnosis not present

## 2021-12-13 LAB — KAPPA/LAMBDA LIGHT CHAINS
Kappa free light chain: 23 mg/L — ABNORMAL HIGH (ref 3.3–19.4)
Kappa, lambda light chain ratio: >15.33 — ABNORMAL HIGH (ref 0.26–1.65)
Lambda free light chains: <1.5 mg/L — ABNORMAL LOW (ref 5.7–26.3)

## 2021-12-13 LAB — IGG, IGA, IGM
IgA: <5 mg/dL — ABNORMAL LOW (ref 64–422)
IgG (Immunoglobin G), Serum: 505 mg/dL — ABNORMAL LOW (ref 586–1602)
IgM (Immunoglobulin M), Srm: 8 mg/dL — ABNORMAL LOW (ref 26–217)

## 2021-12-18 ENCOUNTER — Ambulatory Visit (INDEPENDENT_AMBULATORY_CARE_PROVIDER_SITE_OTHER): Payer: PPO | Admitting: Internal Medicine

## 2021-12-18 ENCOUNTER — Encounter: Payer: Self-pay | Admitting: Hematology & Oncology

## 2021-12-18 ENCOUNTER — Encounter: Payer: Self-pay | Admitting: Internal Medicine

## 2021-12-18 VITALS — BP 150/82 | HR 90 | Temp 98.0°F | Resp 20

## 2021-12-18 DIAGNOSIS — F411 Generalized anxiety disorder: Secondary | ICD-10-CM

## 2021-12-18 DIAGNOSIS — R21 Rash and other nonspecific skin eruption: Secondary | ICD-10-CM | POA: Diagnosis not present

## 2021-12-18 DIAGNOSIS — C9000 Multiple myeloma not having achieved remission: Secondary | ICD-10-CM | POA: Diagnosis not present

## 2021-12-18 DIAGNOSIS — S51852A Open bite of left forearm, initial encounter: Secondary | ICD-10-CM | POA: Diagnosis not present

## 2021-12-18 DIAGNOSIS — W5501XA Bitten by cat, initial encounter: Secondary | ICD-10-CM | POA: Diagnosis not present

## 2021-12-18 MED ORDER — HYDROXYZINE PAMOATE 25 MG PO CAPS: 25.0000 mg | ORAL_CAPSULE | Freq: Three times a day (TID) | ORAL | 0 refills | Status: DC | PRN

## 2021-12-18 MED ORDER — METHYLPREDNISOLONE ACETATE 80 MG/ML IJ SUSP
80.0000 mg | Freq: Once | INTRAMUSCULAR | Status: AC
  Administered 2021-12-18: 80 mg via INTRAMUSCULAR

## 2021-12-18 NOTE — Patient Instructions (Addendum)
Depo-Medrol 80 mg IM given in office today.  She will use triamcinolone cream 0.1% and Eucerin cream on her upper arms up to 3 times daily as needed for itching.  Take Atarax 25 mg every 8 hours as needed for itching.  She was considering stopping her Xanax but I do not think this is wise at this time due to anxiety.  Cat bite looks clean and is several days old.  No evidence of secondary bacterial infection.  Her tetanus immunization is up-to-date.

## 2021-12-18 NOTE — Progress Notes (Signed)
   Subjective:    Patient ID: Debra Ruiz, female    DOB: December 24, 1945, 76 y.o.   MRN: [id number]  HPI 76 year old Female seen for anxiety and pruritus.  Says her arms have been itching.  Cannot get any relief.  One of the oncology nurses has recommended Atarax and I think that is a good idea.  This has been prescribed every 8 hours as needed for itching.  Her husband has dementia.  There is considerable situational stress.  She had to have an eye procedure recently.  She thinks that Lexapro has caused her to itch but in my experience I have not had to stop Lexapro due to dermatitis/itching and I use it frequently in many patients.  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  It does not appear to be infected.  She is taking care of it and it looks clean.  Her tetanus immunization is up-to-date.    Review of Systems has recently started PT at [facility] and is being seen by Dr./ Beane.     Objective:   Physical Exam She has a flap wound on her left forearm that is clean.  She also has senile purpura both arms.  She has been scratching her arms and is scratching in the office today considerably but I do not see any wounds from scratching.  I do not think she has scabies.       Assessment & Plan:  Pruritus-I think this is due to anxiety and not from Lexapro.  She was given Depo-Medrol 80 mg IM and started on Atarax 25 mg every 8 hours as needed for itching.  I have also prescribed triamcinolone cream 0.1% (30 g )in 4 ounces of Eucerin cream to use on her arms up to 3 times daily as needed for itching.  Anxiety state-she is clearly stressed and is talking rapidly in the office today.  She was thinking of stopping her Xanax and I do not think this is wise given her level of anxiety.  Cat bite left arm-tetanus immunization is up-to-date.  Wound appears to be clean with no secondary infection.  Wound is several days old.

## 2021-12-19 LAB — IMMUNOFIXATION REFLEX, SERUM
IgA: <5 mg/dL — ABNORMAL LOW (ref 64–422)
IgG (Immunoglobin G), Serum: 585 mg/dL — ABNORMAL LOW (ref 586–1602)
IgM (Immunoglobulin M), Srm: 13 mg/dL — ABNORMAL LOW (ref 26–217)

## 2021-12-19 LAB — PROTEIN ELECTROPHORESIS, SERUM, WITH REFLEX
A/G Ratio: 1.4 (ref 0.7–1.7)
Albumin ELP: 3.7 g/dL (ref 2.9–4.4)
Alpha-1-Globulin: 0.3 g/dL (ref 0.0–0.4)
Alpha-2-Globulin: 0.8 g/dL (ref 0.4–1.0)
Beta Globulin: 1 g/dL (ref 0.7–1.3)
Gamma Globulin: 0.5 g/dL (ref 0.4–1.8)
Globulin, Total: 2.6 g/dL (ref 2.2–3.9)
M-Spike, %: 0.3 g/dL — ABNORMAL HIGH
SPEP Interpretation: 0
Total Protein ELP: 6.3 g/dL (ref 6.0–8.5)

## 2021-12-20 ENCOUNTER — Other Ambulatory Visit: Payer: Self-pay | Admitting: Internal Medicine

## 2021-12-20 DIAGNOSIS — C9000 Multiple myeloma not having achieved remission: Secondary | ICD-10-CM

## 2021-12-20 DIAGNOSIS — E782 Mixed hyperlipidemia: Secondary | ICD-10-CM

## 2021-12-24 ENCOUNTER — Other Ambulatory Visit: Payer: Self-pay | Admitting: Internal Medicine

## 2021-12-24 DIAGNOSIS — M5451 Vertebrogenic low back pain: Secondary | ICD-10-CM | POA: Diagnosis not present

## 2021-12-26 ENCOUNTER — Other Ambulatory Visit: Payer: Self-pay | Admitting: Hematology & Oncology

## 2021-12-26 DIAGNOSIS — C9000 Multiple myeloma not having achieved remission: Secondary | ICD-10-CM

## 2021-12-27 ENCOUNTER — Encounter: Payer: Self-pay | Admitting: Hematology & Oncology

## 2021-12-31 DIAGNOSIS — H11823 Conjunctivochalasis, bilateral: Secondary | ICD-10-CM | POA: Diagnosis not present

## 2021-12-31 DIAGNOSIS — H04123 Dry eye syndrome of bilateral lacrimal glands: Secondary | ICD-10-CM | POA: Diagnosis not present

## 2021-12-31 DIAGNOSIS — I10 Essential (primary) hypertension: Secondary | ICD-10-CM | POA: Diagnosis not present

## 2021-12-31 DIAGNOSIS — H16233 Neurotrophic keratoconjunctivitis, bilateral: Secondary | ICD-10-CM | POA: Diagnosis not present

## 2022-01-04 ENCOUNTER — Other Ambulatory Visit: Payer: Self-pay | Admitting: Ophthalmology

## 2022-01-04 DIAGNOSIS — C9000 Multiple myeloma not having achieved remission: Secondary | ICD-10-CM

## 2022-01-04 DIAGNOSIS — H4920 Sixth [abducent] nerve palsy, unspecified eye: Secondary | ICD-10-CM

## 2022-01-04 DIAGNOSIS — Z7689 Persons encountering health services in other specified circumstances: Secondary | ICD-10-CM | POA: Diagnosis not present

## 2022-01-07 ENCOUNTER — Ambulatory Visit
Admission: RE | Admit: 2022-01-07 | Discharge: 2022-01-07 | Disposition: A | Discharge status: Home / Self Care | Payer: PPO | Source: Ambulatory Visit | Attending: Ophthalmology | Admitting: Ophthalmology

## 2022-01-07 DIAGNOSIS — I6782 Cerebral ischemia: Secondary | ICD-10-CM | POA: Diagnosis not present

## 2022-01-07 DIAGNOSIS — G589 Mononeuropathy, unspecified: Secondary | ICD-10-CM | POA: Diagnosis not present

## 2022-01-07 DIAGNOSIS — C9000 Multiple myeloma not having achieved remission: Secondary | ICD-10-CM

## 2022-01-07 DIAGNOSIS — H4920 Sixth [abducent] nerve palsy, unspecified eye: Secondary | ICD-10-CM

## 2022-01-07 DIAGNOSIS — G319 Degenerative disease of nervous system, unspecified: Secondary | ICD-10-CM | POA: Diagnosis not present

## 2022-01-09 ENCOUNTER — Inpatient Hospital Stay: Payer: PPO | Attending: Hematology & Oncology

## 2022-01-09 ENCOUNTER — Other Ambulatory Visit: Payer: Self-pay

## 2022-01-09 ENCOUNTER — Encounter: Payer: Self-pay | Admitting: Hematology & Oncology

## 2022-01-09 ENCOUNTER — Other Ambulatory Visit: Payer: Self-pay | Admitting: Oncology

## 2022-01-09 ENCOUNTER — Inpatient Hospital Stay: Payer: PPO

## 2022-01-09 ENCOUNTER — Inpatient Hospital Stay (HOSPITAL_BASED_OUTPATIENT_CLINIC_OR_DEPARTMENT_OTHER): Payer: PPO | Admitting: Hematology & Oncology

## 2022-01-09 VITALS — BP 149/67 | HR 78 | Temp 97.9°F | Resp 18 | Ht 59.84 in | Wt 106.4 lb

## 2022-01-09 VITALS — BP 120/56 | HR 84 | Resp 17

## 2022-01-09 DIAGNOSIS — C9000 Multiple myeloma not having achieved remission: Secondary | ICD-10-CM | POA: Diagnosis not present

## 2022-01-09 DIAGNOSIS — D509 Iron deficiency anemia, unspecified: Secondary | ICD-10-CM | POA: Diagnosis not present

## 2022-01-09 DIAGNOSIS — Z79899 Other long term (current) drug therapy: Secondary | ICD-10-CM | POA: Diagnosis not present

## 2022-01-09 DIAGNOSIS — Z5112 Encounter for antineoplastic immunotherapy: Secondary | ICD-10-CM | POA: Insufficient documentation

## 2022-01-09 LAB — CMP (CANCER CENTER ONLY)
ALT: 15 U/L (ref 0–44)
AST: 14 U/L — ABNORMAL LOW (ref 15–41)
Albumin: 4.2 g/dL (ref 3.5–5.0)
Alkaline Phosphatase: 57 U/L (ref 38–126)
Anion gap: 12 (ref 5–15)
BUN: 25 mg/dL — ABNORMAL HIGH (ref 8–23)
CO2: 23 mmol/L (ref 22–32)
Calcium: 9.4 mg/dL (ref 8.9–10.3)
Chloride: 106 mmol/L (ref 98–111)
Creatinine: 0.72 mg/dL (ref 0.44–1.00)
GFR, Estimated: >60 mL/min (ref >60)
Glucose, Bld: 163 mg/dL — ABNORMAL HIGH (ref 70–99)
Potassium: 4.2 mmol/L (ref 3.5–5.1)
Sodium: 141 mmol/L (ref 135–145)
Total Bilirubin: 0.5 mg/dL (ref 0.3–1.2)
Total Protein: 6.6 g/dL (ref 6.5–8.1)

## 2022-01-09 LAB — CBC WITH DIFFERENTIAL (CANCER CENTER ONLY)
Abs Immature Granulocytes: 0.04 10*3/uL (ref 0.00–0.07)
Basophils Absolute: 0 10*3/uL (ref 0.0–0.1)
Basophils Relative: 0 %
Eosinophils Absolute: 0 10*3/uL (ref 0.0–0.5)
Eosinophils Relative: 0 %
HCT: 40.5 % (ref 36.0–46.0)
Hemoglobin: 13.5 g/dL (ref 12.0–15.0)
Immature Granulocytes: 1 %
Lymphocytes Relative: 16 %
Lymphs Abs: 1.3 10*3/uL (ref 0.7–4.0)
MCH: 34.5 pg — ABNORMAL HIGH (ref 26.0–34.0)
MCHC: 33.3 g/dL (ref 30.0–36.0)
MCV: 103.6 fL — ABNORMAL HIGH (ref 80.0–100.0)
Monocytes Absolute: 0.6 10*3/uL (ref 0.1–1.0)
Monocytes Relative: 8 %
Neutro Abs: 6.2 10*3/uL (ref 1.7–7.7)
Neutrophils Relative %: 75 %
Platelet Count: 204 10*3/uL (ref 150–400)
RBC: 3.91 MIL/uL (ref 3.87–5.11)
RDW: 12.1 % (ref 11.5–15.5)
WBC Count: 8.2 10*3/uL (ref 4.0–10.5)
nRBC: 0 % (ref 0.0–0.2)

## 2022-01-09 LAB — LACTATE DEHYDROGENASE: LDH: 265 U/L — ABNORMAL HIGH (ref 98–192)

## 2022-01-09 MED ORDER — SODIUM CHLORIDE 0.9 % IV SOLN: Freq: Once | INTRAVENOUS | Status: DC

## 2022-01-09 MED ORDER — DIPHENHYDRAMINE HCL 25 MG PO CAPS: 25.0000 mg | ORAL_CAPSULE | Freq: Once | ORAL | Status: DC

## 2022-01-09 MED ORDER — DEXTROSE 5 % IV SOLN
70.0000 mg/m2 | Freq: Once | INTRAVENOUS | Status: AC
  Administered 2022-01-09: 100 mg via INTRAVENOUS
  Filled 2022-01-09: qty 30

## 2022-01-09 MED ORDER — DARATUMUMAB-HYALURONIDASE-FIHJ 1800-30000 MG-UT/15ML ~~LOC~~ SOLN
1800.0000 mg | Freq: Once | SUBCUTANEOUS | Status: AC
  Administered 2022-01-09: 1800 mg via SUBCUTANEOUS
  Filled 2022-01-09: qty 15

## 2022-01-09 MED ORDER — ACETAMINOPHEN 325 MG PO TABS: 650.0000 mg | ORAL_TABLET | Freq: Once | ORAL | Status: DC

## 2022-01-09 MED ORDER — SODIUM CHLORIDE 0.9 % IV SOLN
40.0000 mg | Freq: Once | INTRAVENOUS | Status: AC
  Administered 2022-01-09: 40 mg via INTRAVENOUS
  Filled 2022-01-09: qty 4

## 2022-01-09 MED ORDER — SODIUM CHLORIDE 0.9 % IV SOLN: Freq: Once | INTRAVENOUS | Status: AC

## 2022-01-09 NOTE — Progress Notes (Signed)
Hematology and Oncology Follow Up Visit  Debra Ruiz [id number] 1945-08-21 76 y.o. 01/09/2022   Principle Diagnosis:  IgG Kappa myeloma - compression fractures - 13q-, +11, 14q+ - hyperdiploid -- progressive Iron def anemia  Completed Therapy: S/p Kyphoplasty of T11 and T12 XRT to lower spine-completed on 09/25/2016  Current Therapy:    Faspro -- start on 04/05/2020 -- s/p cycle #23 -- Kyprolis added on 07/20/2020 -- s/p cycle #22 Ninlaro 2.3 mg po q week (3 on/1 off) -- start on 01/24/2020 -- d/c on 03/29/2020 due to toxicity Velcade/Pomalyst/Decadron -Start on 09/02/2018 --Pomalyst on hold since 11/09/2018.  D/C on 01/17/2020 Xgeva 162m Sq q  3 month - next dose in 02/2022  IV Injectafer -- last dose given on 11/2018   Interim History:  Debra Ruiz here today for follow-up.  The problem now is that she is having issues with the right eye.  I know she had surgery for her right eye before.  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  She had MRI of the brain recently.  This was unremarkable.  There is no obvious orbital issues.  She had a chronic cerebrovascular changes.  There is no bleed.  There is no mass.  There is no swelling.  She sees her ophthalmologist.  He is is very good.  Hopefully, he will be able to help her out.  I do not think this is a viral issue.  She is on Famvir as a prophylactic measure.  Her myeloma seems to be doing okay.  Her last monoclonal spike was 0.3 g/dL.  Her kappa light chain was up a little bit.  It was 2.3 mg/dL.  She has had no pain.  She does have back issues.  She has had no change in bowel or bladder habits.  There is been no cough or shortness of breath.  She has had no nausea or vomiting.  Overall, I would say performance status is probably ECOG 2.     Medications:  Allergies as of 01/09/2022       Reactions   Crestor [rosuvastatin Calcium] Other (See Comments)   Causes liver functions to be elevated        Medication List         Accurate as of January 09, 2022  9:34 AM. If you have any questions, ask your nurse or doctor.          ALPRAZolam 0.5 MG tablet Commonly known as: XANAX Take 1 tablet (0.5 mg total) by mouth 2 (two) times daily as needed for anxiety.   amLODipine 5 MG tablet Commonly known as: NORVASC TAKE 1 TABLET(5 MG) BY MOUTH DAILY   aspirin EC 81 MG tablet Take 81 mg by mouth every other day.   atorvastatin 20 MG tablet Commonly known as: LIPITOR TAKE 1 TABLET(20 MG) BY MOUTH DAILY   buPROPion 300 MG 24 hr tablet Commonly known as: WELLBUTRIN XL TAKE 1 TABLET(300 MG) BY MOUTH DAILY   dexamethasone 4 MG tablet Commonly known as: DECADRON TAKE 3 TABLETS BY MOUTH 1 TIME A WEEK   escitalopram 20 MG tablet Commonly known as: LEXAPRO TAKE 1 TABLET(20 MG) BY MOUTH DAILY   famciclovir 250 MG tablet Commonly known as: FAMVIR TAKE 1 TABLET(250 MG) BY MOUTH DAILY   Flarex 0.1 % ophthalmic suspension Generic drug: fluorometholone Flarex 0.1 % eye drops,suspension  Instill ONE drop into BOTH eyes FOUR times A DAY   hydrOXYzine 25 MG capsule Commonly known as:  VISTARIL Take 1 capsule (25 mg total) by mouth every 8 (eight) hours as needed.   nebivolol 5 MG tablet Commonly known as: Bystolic TAKE 1 TABLET(5 MG) BY MOUTH DAILY   Oxervate 0.002 % Soln Generic drug: Cenegermin-bkbj SMARTSIG:1 Drop(s) In Eye(s) 6 Times Daily   oxybutynin 10 MG 24 hr tablet Commonly known as: DITROPAN-XL Take 1 tablet (10 mg total) by mouth at bedtime.   oxyCODONE-acetaminophen 5-325 MG tablet Commonly known as: PERCOCET/ROXICET Take 1 tablet by mouth every 4 (four) hours as needed.   potassium chloride SA 20 MEQ tablet Commonly known as: KLOR-CON M TAKE 1 TABLET BY MOUTH DAILY   prochlorperazine 10 MG tablet Commonly known as: COMPAZINE TAKE 1 TABLET(10 MG) BY MOUTH EVERY 6 HOURS AS NEEDED FOR NAUSEA OR VOMITING   REFRESH DRY EYE THERAPY OP Apply to eye 3 (three) times daily.    triamcinolone 0.1 % cream : eucerin Crea triamcinolone cr. 0.1%/eucerin cr.  APPLY TO SKIN TWICE DAILY FOR ITCHING   Vitamin B-12 5000 MCG Tbdp Take by mouth daily.   Vitamin D 50 MCG (2000 UT) Caps Take 2,000 Units by mouth daily.   zolpidem 10 MG tablet Commonly known as: AMBIEN Take 1 tablet (10 mg total) by mouth at bedtime.        Allergies:  Allergies  Allergen Reactions   Crestor [Rosuvastatin Calcium] Other (See Comments)    Causes liver functions to be elevated    Past Medical History, Surgical history, Social history, and Family History were reviewed and updated.  Review of Systems: Review of Systems  Constitutional: Negative.   HENT: Negative.    Eyes: Negative.   Respiratory: Negative.    Cardiovascular: Negative.   Gastrointestinal: Negative.   Genitourinary: Negative.   Musculoskeletal:  Positive for back pain.  Skin: Negative.   Neurological: Negative.   Endo/Heme/Allergies: Negative.   Psychiatric/Behavioral: Negative.      Physical Exam:  height is 4' 11.84" (1.52 m) and weight is 106 lb 6.4 oz (48.3 kg). Her oral temperature is 97.9 F (36.6 C). Her blood pressure is 149/67 (abnormal) and her pulse is 78. Her respiration is 18 and oxygen saturation is 100%.   Wt Readings from Last 3 Encounters:  01/09/22 106 lb 6.4 oz (48.3 kg)  12/12/21 108 lb (49 kg)  11/14/21 108 lb (49 kg)    Physical Exam Vitals reviewed.  HENT:     Head: Normocephalic and atraumatic.  Eyes:     Pupils: Pupils are equal, round, and reactive to light.  Cardiovascular:     Rate and Rhythm: Normal rate and regular rhythm.     Heart sounds: Normal heart sounds.  Pulmonary:     Effort: Pulmonary effort is normal.     Breath sounds: Normal breath sounds.  Abdominal:     General: Bowel sounds are normal.     Palpations: Abdomen is soft.  Musculoskeletal:        General: No tenderness or deformity. Normal range of motion.     Cervical back: Normal range of motion.   Lymphadenopathy:     Cervical: No cervical adenopathy.  Skin:    General: Skin is warm and dry.     Findings: No erythema or rash.  Neurological:     Mental Status: She is alert and oriented to person, place, and time.  Psychiatric:        Behavior: Behavior normal.        Thought Content: Thought content normal.  Judgment: Judgment normal.       Lab Results  Component Value Date   WBC 8.2 01/09/2022   HGB 13.5 01/09/2022   HCT 40.5 01/09/2022   MCV 103.6 (H) 01/09/2022   PLT 204 01/09/2022   Lab Results  Component Value Date   FERRITIN 258 10/25/2020   IRON 136 10/25/2020   TIBC 353 10/25/2020   UIBC 216 10/25/2020   IRONPCTSAT 39 10/25/2020   Lab Results  Component Value Date   RBC 3.91 01/09/2022   Lab Results  Component Value Date   KPAFRELGTCHN 23.0 (H) 12/12/2021   LAMBDASER <1.5 (L) 12/12/2021   KAPLAMBRATIO >15.33 (H) 12/12/2021   Lab Results  Component Value Date   IGGSERUM 505 (L) 12/12/2021   IGGSERUM 585 (L) 12/12/2021   IGA <5 (L) 12/12/2021   IGA <5 (L) 12/12/2021   IGMSERUM 8 (L) 12/12/2021   IGMSERUM 13 (L) 12/12/2021   Lab Results  Component Value Date   TOTALPROTELP 6.3 12/12/2021   ALBUMINELP 3.7 12/12/2021   A1GS 0.3 12/12/2021   A2GS 0.8 12/12/2021   BETS 1.0 12/12/2021   GAMS 0.5 12/12/2021   MSPIKE 0.3 (H) 12/12/2021   SPEI Comment 01/28/2018     Chemistry      Component Value Date/Time   NA 141 01/09/2022 0846   NA 148 (H) 07/16/2017 1128   NA 143 03/05/2017 1051   K 4.2 01/09/2022 0846   K 3.8 07/16/2017 1128   K 3.7 03/05/2017 1051   CL 106 01/09/2022 0846   CL 106 07/16/2017 1128   CO2 23 01/09/2022 0846   CO2 29 07/16/2017 1128   CO2 25 03/05/2017 1051   BUN 25 (H) 01/09/2022 0846   BUN 23 (H) 07/16/2017 1128   BUN 18.2 03/05/2017 1051   CREATININE 0.72 01/09/2022 0846   CREATININE 0.75 06/20/2020 1127   CREATININE 0.8 03/05/2017 1051      Component Value Date/Time   CALCIUM 9.4 01/09/2022 0846    CALCIUM 9.7 07/16/2017 1128   CALCIUM 8.9 03/05/2017 1051   ALKPHOS 57 01/09/2022 0846   ALKPHOS 71 07/16/2017 1128   ALKPHOS 67 03/05/2017 1051   AST 14 (L) 01/09/2022 0846   AST 15 03/05/2017 1051   ALT 15 01/09/2022 0846   ALT 23 07/16/2017 1128   ALT 17 03/05/2017 1051   BILITOT 0.5 01/09/2022 0846   BILITOT 0.55 03/05/2017 1051        Impression and Plan: Ms. Harden is a very pleasant 76 yo caucasian female with IgG Kappa Myeloma.   I just feel bad that she has his right eye issue.  I noted that she is trying hard.  I did state the fact that there is a problem.  Hopefully this will resolve.  Her quality of life really is hindered by this right ocular issue.  We will see what her myeloma studies look like.  She does not need Xgeva, I think until August.  We will plan to get her back in another month, as always.   [name], MD 6/21/20239:34 AM

## 2022-01-09 NOTE — Patient Instructions (Signed)
[facility]  Discharge Instructions: Thank you for choosing [facility] to provide your oncology and hematology care.   If you have a lab appointment with the [facility], please go directly to the [facility] and check in at the registration area.  Wear comfortable clothing and clothing appropriate for easy access to any Portacath or PICC line.   We strive to give you quality time with your provider. You may need to reschedule your appointment if you arrive late (15 or more minutes).  Arriving late affects you and other patients whose appointments are after yours.  Also, if you miss three or more appointments without notifying the office, you may be dismissed from the clinic at the provider's discretion.      For prescription refill requests, have your pharmacy contact our office and allow 72 hours for refills to be completed.    Today you received the following chemotherapy and/or immunotherapy agents darzalex and kyprolis     To help prevent nausea and vomiting after your treatment, we encourage you to take your nausea medication as directed.  BELOW ARE SYMPTOMS THAT SHOULD BE REPORTED IMMEDIATELY: *FEVER GREATER THAN 100.4 F (38 C) OR HIGHER *CHILLS OR SWEATING *NAUSEA AND VOMITING THAT IS NOT CONTROLLED WITH YOUR NAUSEA MEDICATION *UNUSUAL SHORTNESS OF BREATH *UNUSUAL BRUISING OR BLEEDING *URINARY PROBLEMS (pain or burning when urinating, or frequent urination) *BOWEL PROBLEMS (unusual diarrhea, constipation, pain near the anus) TENDERNESS IN MOUTH AND THROAT WITH OR WITHOUT PRESENCE OF ULCERS (sore throat, sores in mouth, or a toothache) UNUSUAL RASH, SWELLING OR PAIN  UNUSUAL VAGINAL DISCHARGE OR ITCHING   Items with * indicate a potential emergency and should be followed up as soon as possible or go to the Emergency Department if any problems should occur.  Please show the CHEMOTHERAPY ALERT CARD or IMMUNOTHERAPY ALERT CARD at  check-in to the Emergency Department and triage nurse. Should you have questions after your visit or need to cancel or reschedule your appointment, please contact [facility]  [phone] and follow the prompts.  Office hours are 8:00 a.m. to 4:30 p.m. Monday - Friday. Please note that voicemails left after 4:00 p.m. may not be returned until the following business day.  We are closed weekends and major holidays. You have access to a nurse at all times for urgent questions. Please call the main number to the clinic [phone] and follow the prompts.  For any non-urgent questions, you may also contact your provider using MyChart. We now offer e-Visits for anyone 63 and older to request care online for non-urgent symptoms. For details visit [email or web address].   Also download the MyChart app! Go to the app store, search "MyChart", open the app, select [facility], and log in with your MyChart username and password.  Due to Covid, a mask is required upon entering the hospital/clinic. If you do not have a mask, one will be given to you upon arrival. For doctor visits, patients may have 1 support person aged 59 or older with them. For treatment visits, patients cannot have anyone with them due to current Covid guidelines and our immunocompromised population.   Carfilzomib injection What is this medication? CARFILZOMIB (kar FILZ oh mib) targets a specific protein within cancer cells and stops the cancer cells from growing. It is used to treat multiple myeloma. This medicine may be used for other purposes; ask your health care provider or pharmacist if you have questions. COMMON  BRAND NAME(S): KYPROLIS What should I tell my care team before I take this medication? They need to know if you have any of these conditions: heart disease history of blood clots irregular heartbeat kidney disease liver disease lung or breathing disease an unusual or allergic reaction to  carfilzomib, or other medicines, foods, dyes, or preservatives pregnant or trying to get pregnant breast-feeding How should I use this medication? This medicine is for injection or infusion into a vein. It is given by a health care professional in a hospital or clinic setting. Talk to your pediatrician regarding the use of this medicine in children. Special care may be needed. Overdosage: If you think you have taken too much of this medicine contact a poison control center or emergency room at once. NOTE: This medicine is only for you. Do not share this medicine with others. What if I miss a dose? It is important not to miss your dose. Call your doctor or health care professional if you are unable to keep an appointment. What may interact with this medication? Interactions are not expected. This list may not describe all possible interactions. Give your health care provider a list of all the medicines, herbs, non-prescription drugs, or dietary supplements you use. Also tell them if you smoke, drink alcohol, or use illegal drugs. Some items may interact with your medicine. What should I watch for while using this medication? Your condition will be monitored while you are receiving this medicine. You may need blood work done while you are taking this medicine. Do not become pregnant while taking this medicine or for 6 months after stopping it. Women should inform their health care provider if they wish to become pregnant or think they might be pregnant. Men should not father a child while taking this medicine and for 3 months after stopping it. There is a potential for serious side effects to an unborn child. Talk to your health care provider for more information. Do not breast-feed an infant while taking this medicine or for 2 weeks after stopping it. Check with your health care provider if you have severe diarrhea, nausea, and vomiting, or if you sweat a lot. The loss of too much body fluid may make  it dangerous for you to take this medicine. You may get drowsy or dizzy. Do not drive, use machinery, or do anything that needs mental alertness until you know how this medicine affects you. Do not stand up or sit up quickly, especially if you are an older patient. This reduces the risk of dizzy or fainting spells. What side effects may I notice from receiving this medication? Side effects that you should report to your doctor or health care professional as soon as possible: allergic reactions like skin rash, itching or hives, swelling of the face, lips, or tongue confusion dizziness feeling faint or lightheaded fever or chills palpitations seizures signs and symptoms of bleeding such as bloody or black, tarry stools; red or dark-brown urine; spitting up blood or brown material that looks like coffee grounds; red spots on the skin; unusual bruising or bleeding including from the eye, gums, or nose signs and symptoms of a blood clot such as breathing problems; changes in vision; chest pain; severe, sudden headache; pain, swelling, warmth in the leg; trouble speaking; sudden numbness or weakness of the face, arm or leg signs and symptoms of kidney injury like trouble passing urine or change in the amount of urine signs and symptoms of liver injury like dark yellow or  brown urine; general ill feeling or flu-like symptoms; light-colored stools; loss of appetite; nausea; right upper belly pain; unusually weak or tired; yellowing of the eyes or skin Side effects that usually do not require medical attention (report to your doctor or health care professional if they continue or are bothersome): back pain cough diarrhea headache muscle cramps trouble sleeping vomiting This list may not describe all possible side effects. Call your doctor for medical advice about side effects. You may report side effects to FDA at 1-800-FDA-1088. Where should I keep my medication? This drug is given in a hospital or  clinic and will not be stored at home. NOTE: This sheet is a summary. It may not cover all possible information. If you have questions about this medicine, talk to your doctor, pharmacist, or health care provider.  2023 Elsevier/Gold Standard (2020-08-17 00:00:00)

## 2022-01-10 LAB — KAPPA/LAMBDA LIGHT CHAINS
Kappa free light chain: 16.4 mg/L (ref 3.3–19.4)
Kappa, lambda light chain ratio: >10.93 — ABNORMAL HIGH (ref 0.26–1.65)
Lambda free light chains: <1.5 mg/L — ABNORMAL LOW (ref 5.7–26.3)

## 2022-01-10 LAB — IGG, IGA, IGM
IgA: <5 mg/dL — ABNORMAL LOW (ref 64–422)
IgG (Immunoglobin G), Serum: 521 mg/dL — ABNORMAL LOW (ref 586–1602)
IgM (Immunoglobulin M), Srm: 7 mg/dL — ABNORMAL LOW (ref 26–217)

## 2022-01-11 ENCOUNTER — Encounter: Payer: Self-pay | Admitting: *Deleted

## 2022-01-15 LAB — PROTEIN ELECTROPHORESIS, SERUM, WITH REFLEX
A/G Ratio: 1.4 (ref 0.7–1.7)
Albumin ELP: 3.5 g/dL (ref 2.9–4.4)
Alpha-1-Globulin: 0.3 g/dL (ref 0.0–0.4)
Alpha-2-Globulin: 0.9 g/dL (ref 0.4–1.0)
Beta Globulin: 0.9 g/dL (ref 0.7–1.3)
Gamma Globulin: 0.4 g/dL (ref 0.4–1.8)
Globulin, Total: 2.5 g/dL (ref 2.2–3.9)
M-Spike, %: 0.4 g/dL — ABNORMAL HIGH
SPEP Interpretation: 0
Total Protein ELP: 6 g/dL (ref 6.0–8.5)

## 2022-01-15 LAB — IMMUNOFIXATION REFLEX, SERUM
IgA: <5 mg/dL — ABNORMAL LOW (ref 64–422)
IgG (Immunoglobin G), Serum: 578 mg/dL — ABNORMAL LOW (ref 586–1602)
IgM (Immunoglobulin M), Srm: 8 mg/dL — ABNORMAL LOW (ref 26–217)

## 2022-01-16 ENCOUNTER — Other Ambulatory Visit: Payer: Self-pay | Admitting: Hematology & Oncology

## 2022-01-16 ENCOUNTER — Other Ambulatory Visit: Payer: Self-pay | Admitting: Internal Medicine

## 2022-02-04 DIAGNOSIS — M5451 Vertebrogenic low back pain: Secondary | ICD-10-CM | POA: Diagnosis not present

## 2022-02-06 ENCOUNTER — Inpatient Hospital Stay: Payer: PPO | Attending: Hematology & Oncology

## 2022-02-06 ENCOUNTER — Inpatient Hospital Stay: Payer: PPO

## 2022-02-06 ENCOUNTER — Encounter: Payer: Self-pay | Admitting: Hematology & Oncology

## 2022-02-06 ENCOUNTER — Inpatient Hospital Stay (HOSPITAL_BASED_OUTPATIENT_CLINIC_OR_DEPARTMENT_OTHER): Payer: PPO | Admitting: Hematology & Oncology

## 2022-02-06 VITALS — BP 117/67 | HR 80 | Resp 20 | Ht 59.0 in | Wt 106.4 lb

## 2022-02-06 VITALS — BP 137/58 | HR 64

## 2022-02-06 DIAGNOSIS — Z5112 Encounter for antineoplastic immunotherapy: Secondary | ICD-10-CM | POA: Insufficient documentation

## 2022-02-06 DIAGNOSIS — D509 Iron deficiency anemia, unspecified: Secondary | ICD-10-CM | POA: Diagnosis not present

## 2022-02-06 DIAGNOSIS — C9000 Multiple myeloma not having achieved remission: Secondary | ICD-10-CM | POA: Diagnosis not present

## 2022-02-06 DIAGNOSIS — Z79899 Other long term (current) drug therapy: Secondary | ICD-10-CM | POA: Insufficient documentation

## 2022-02-06 LAB — CMP (CANCER CENTER ONLY)
ALT: 10 U/L (ref 0–44)
AST: 11 U/L — ABNORMAL LOW (ref 15–41)
Albumin: 4.2 g/dL (ref 3.5–5.0)
Alkaline Phosphatase: 66 U/L (ref 38–126)
Anion gap: 9 (ref 5–15)
BUN: 23 mg/dL (ref 8–23)
CO2: 24 mmol/L (ref 22–32)
Calcium: 9.7 mg/dL (ref 8.9–10.3)
Chloride: 108 mmol/L (ref 98–111)
Creatinine: 0.63 mg/dL (ref 0.44–1.00)
GFR, Estimated: >60 mL/min (ref >60)
Glucose, Bld: 126 mg/dL — ABNORMAL HIGH (ref 70–99)
Potassium: 4.1 mmol/L (ref 3.5–5.1)
Sodium: 141 mmol/L (ref 135–145)
Total Bilirubin: 0.6 mg/dL (ref 0.3–1.2)
Total Protein: 6.3 g/dL — ABNORMAL LOW (ref 6.5–8.1)

## 2022-02-06 LAB — CBC WITH DIFFERENTIAL (CANCER CENTER ONLY)
Abs Immature Granulocytes: 0.03 10*3/uL (ref 0.00–0.07)
Basophils Absolute: 0 10*3/uL (ref 0.0–0.1)
Basophils Relative: 0 %
Eosinophils Absolute: 0.1 10*3/uL (ref 0.0–0.5)
Eosinophils Relative: 1 %
HCT: 37.9 % (ref 36.0–46.0)
Hemoglobin: 13 g/dL (ref 12.0–15.0)
Immature Granulocytes: 1 %
Lymphocytes Relative: 23 %
Lymphs Abs: 1.3 10*3/uL (ref 0.7–4.0)
MCH: 33.9 pg (ref 26.0–34.0)
MCHC: 34.3 g/dL (ref 30.0–36.0)
MCV: 99 fL (ref 80.0–100.0)
Monocytes Absolute: 0.5 10*3/uL (ref 0.1–1.0)
Monocytes Relative: 9 %
Neutro Abs: 3.6 10*3/uL (ref 1.7–7.7)
Neutrophils Relative %: 66 %
Platelet Count: 256 10*3/uL (ref 150–400)
RBC: 3.83 MIL/uL — ABNORMAL LOW (ref 3.87–5.11)
RDW: 12.2 % (ref 11.5–15.5)
WBC Count: 5.4 10*3/uL (ref 4.0–10.5)
nRBC: 0 % (ref 0.0–0.2)

## 2022-02-06 LAB — LACTATE DEHYDROGENASE: LDH: 187 U/L (ref 98–192)

## 2022-02-06 MED ORDER — DEXTROSE 5 % IV SOLN
70.0000 mg/m2 | Freq: Once | INTRAVENOUS | Status: AC
  Administered 2022-02-06: 100 mg via INTRAVENOUS
  Filled 2022-02-06: qty 30

## 2022-02-06 MED ORDER — DARATUMUMAB-HYALURONIDASE-FIHJ 1800-30000 MG-UT/15ML ~~LOC~~ SOLN
1800.0000 mg | Freq: Once | SUBCUTANEOUS | Status: AC
  Administered 2022-02-06: 1800 mg via SUBCUTANEOUS
  Filled 2022-02-06: qty 15

## 2022-02-06 MED ORDER — SODIUM CHLORIDE 0.9 % IV SOLN
40.0000 mg | Freq: Once | INTRAVENOUS | Status: AC
  Administered 2022-02-06: 40 mg via INTRAVENOUS
  Filled 2022-02-06: qty 4

## 2022-02-06 MED ORDER — SODIUM CHLORIDE 0.9 % IV SOLN: Freq: Once | INTRAVENOUS | Status: AC

## 2022-02-06 MED ORDER — TRAMADOL HCL 50 MG PO TABS: 50.0000 mg | ORAL_TABLET | Freq: Four times a day (QID) | ORAL | 0 refills | Status: DC | PRN

## 2022-02-06 MED ORDER — DIPHENHYDRAMINE HCL 25 MG PO CAPS: 25.0000 mg | ORAL_CAPSULE | Freq: Once | ORAL | Status: DC

## 2022-02-06 MED ORDER — ACETAMINOPHEN 325 MG PO TABS: 650.0000 mg | ORAL_TABLET | Freq: Once | ORAL | Status: DC

## 2022-02-06 NOTE — Progress Notes (Signed)
Hematology and Oncology Follow Up Visit  Debra Ruiz [id number] Jun 30, 1946 76 y.o. 02/06/2022   Principle Diagnosis:  IgG Kappa myeloma - compression fractures - 13q-, +11, 14q+ - hyperdiploid -- progressive Iron def anemia  Completed Therapy: S/p Kyphoplasty of T11 and T12 XRT to lower spine-completed on 09/25/2016  Current Therapy:    Faspro -- start on 04/05/2020 -- s/p cycle #23 -- Kyprolis added on 07/20/2020 -- s/p cycle #23 Ninlaro 2.3 mg po q week (3 on/1 off) -- start on 01/24/2020 -- d/c on 03/29/2020 due to toxicity Velcade/Pomalyst/Decadron -Start on 09/02/2018 --Pomalyst on hold since 11/09/2018.  D/C on 01/17/2020 Xgeva $RemoveBeforeDE'120mg'HZxbyZWrsVTqYos$  Sq q  3 month - next dose in 02/2022  IV Injectafer -- last dose given on 11/2018   Interim History:  Debra Ruiz is here today for follow-up.  There are a lot of things going on with her right now.  She still is bothered by the right eye.  She has a lateral rectal palsy.  She is seeing ophthalmology.  She had MRI of the brain which was unremarkable.  She has a patch over her right eye now.  She is also bothered by lower back.  She has spinal stenosis.  She clearly needs to have surgery for this.  From a oncologic point of view, I do not see a problem with her having surgery.  I will have to talk to her orthopedic spine surgeon about this.  She comes in a rolling walker today.  She is having hard time walking because of pain and weakness in the right leg.  As far as her myeloma is concerned, this is holding nice and steady.  The last monoclonal spike was 0.4 g/dL.  Her IgG level was 550 mg/dL.  The Kappa light chain was 1.6 mg/dL.  Her quality of life clearly is dictated by her back.  She is eating okay.  Today is her husband's 90[age] birthday.  I just hate that she has to be treated today.  I am sure that she will have a nice event for him.  She has had no problems with bowel or bladder changes.  There is no incontinence.  She has had no  fever.  Overall, I would say performance status is probably ECOG 1.     Medications:  Allergies as of 02/06/2022       Reactions   Crestor [rosuvastatin Calcium] Other (See Comments)   Causes liver functions to be elevated        Medication List        Accurate as of February 06, 2022 10:14 AM. If you have any questions, ask your nurse or doctor.          ALPRAZolam 0.5 MG tablet Commonly known as: XANAX Take 1 tablet (0.5 mg total) by mouth 2 (two) times daily as needed for anxiety.   amLODipine 5 MG tablet Commonly known as: NORVASC TAKE 1 TABLET(5 MG) BY MOUTH DAILY   aspirin EC 81 MG tablet Take 81 mg by mouth every other day.   atorvastatin 20 MG tablet Commonly known as: LIPITOR TAKE 1 TABLET(20 MG) BY MOUTH DAILY   buPROPion 300 MG 24 hr tablet Commonly known as: WELLBUTRIN XL TAKE 1 TABLET(300 MG) BY MOUTH DAILY   dexamethasone 4 MG tablet Commonly known as: DECADRON TAKE 3 TABLETS BY MOUTH 1 TIME A WEEK   escitalopram 20 MG tablet Commonly known as: LEXAPRO TAKE 1 TABLET(20 MG) BY MOUTH DAILY   famciclovir 250 MG  tablet Commonly known as: FAMVIR TAKE 1 TABLET(250 MG) BY MOUTH DAILY   Flarex 0.1 % ophthalmic suspension Generic drug: fluorometholone Flarex 0.1 % eye drops,suspension  Instill ONE drop into BOTH eyes FOUR times A DAY   hydrOXYzine 25 MG capsule Commonly known as: VISTARIL Take 1 capsule (25 mg total) by mouth every 8 (eight) hours as needed.   nebivolol 5 MG tablet Commonly known as: Bystolic TAKE 1 TABLET(5 MG) BY MOUTH DAILY   Oxervate 0.002 % Soln Generic drug: Cenegermin-bkbj SMARTSIG:1 Drop(s) In Eye(s) 6 Times Daily   oxybutynin 10 MG 24 hr tablet Commonly known as: DITROPAN-XL Take 1 tablet (10 mg total) by mouth at bedtime.   oxyCODONE-acetaminophen 5-325 MG tablet Commonly known as: PERCOCET/ROXICET Take 1 tablet by mouth every 4 (four) hours as needed.   potassium chloride SA 20 MEQ tablet Commonly known as:  KLOR-CON M TAKE 1 TABLET BY MOUTH DAILY   prochlorperazine 10 MG tablet Commonly known as: COMPAZINE TAKE 1 TABLET(10 MG) BY MOUTH EVERY 6 HOURS AS NEEDED FOR NAUSEA OR VOMITING   REFRESH DRY EYE THERAPY OP Apply to eye 3 (three) times daily.   triamcinolone 0.1 % cream : eucerin Crea triamcinolone cr. 0.1%/eucerin cr.  APPLY TO SKIN TWICE DAILY FOR ITCHING   Vitamin B-12 5000 MCG Tbdp Take by mouth daily.   Vitamin D 50 MCG (2000 UT) Caps Take 2,000 Units by mouth daily.   zolpidem 10 MG tablet Commonly known as: AMBIEN TAKE 1 TABLET(10 MG) BY MOUTH AT BEDTIME        Allergies:  Allergies  Allergen Reactions   Crestor [Rosuvastatin Calcium] Other (See Comments)    Causes liver functions to be elevated    Past Medical History, Surgical history, Social history, and Family History were reviewed and updated.  Review of Systems: Review of Systems  Constitutional: Negative.   HENT: Negative.    Eyes:  Positive for double vision.  Respiratory: Negative.    Cardiovascular: Negative.   Gastrointestinal: Negative.   Genitourinary: Negative.   Musculoskeletal:  Positive for back pain.  Skin: Negative.   Neurological: Negative.   Endo/Heme/Allergies: Negative.   Psychiatric/Behavioral: Negative.      Physical Exam:  height is $RemoveB'4\' 11"'rxYhBehe$  (1.499 m) and weight is 106 lb 6.4 oz (48.3 kg). Her blood pressure is 117/67 and her pulse is 80. Her respiration is 20 and oxygen saturation is 100%.   Wt Readings from Last 3 Encounters:  02/06/22 106 lb 6.4 oz (48.3 kg)  01/09/22 106 lb 6.4 oz (48.3 kg)  12/12/21 108 lb (49 kg)    Physical Exam Vitals reviewed.  HENT:     Head: Normocephalic and atraumatic.  Eyes:     Pupils: Pupils are equal, round, and reactive to light.     Comments: Her ocular exam shows a patch over her right eye.  Cardiovascular:     Rate and Rhythm: Normal rate and regular rhythm.     Heart sounds: Normal heart sounds.  Pulmonary:     Effort:  Pulmonary effort is normal.     Breath sounds: Normal breath sounds.  Abdominal:     General: Bowel sounds are normal.     Palpations: Abdomen is soft.  Musculoskeletal:        General: No tenderness or deformity. Normal range of motion.     Cervical back: Normal range of motion.  Lymphadenopathy:     Cervical: No cervical adenopathy.  Skin:    General: Skin is warm  and dry.     Findings: No erythema or rash.  Neurological:     Mental Status: She is alert and oriented to person, place, and time.  Psychiatric:        Behavior: Behavior normal.        Thought Content: Thought content normal.        Judgment: Judgment normal.       Lab Results  Component Value Date   WBC 5.4 02/06/2022   HGB 13.0 02/06/2022   HCT 37.9 02/06/2022   MCV 99.0 02/06/2022   PLT 256 02/06/2022   Lab Results  Component Value Date   FERRITIN 258 10/25/2020   IRON 136 10/25/2020   TIBC 353 10/25/2020   UIBC 216 10/25/2020   IRONPCTSAT 39 10/25/2020   Lab Results  Component Value Date   RBC 3.83 (L) 02/06/2022   Lab Results  Component Value Date   KPAFRELGTCHN 16.4 01/09/2022   LAMBDASER <1.5 (L) 01/09/2022   KAPLAMBRATIO >10.93 (H) 01/09/2022   Lab Results  Component Value Date   IGGSERUM 521 (L) 01/09/2022   IGGSERUM 578 (L) 01/09/2022   IGA <5 (L) 01/09/2022   IGA <5 (L) 01/09/2022   IGMSERUM 7 (L) 01/09/2022   IGMSERUM 8 (L) 01/09/2022   Lab Results  Component Value Date   TOTALPROTELP 6.0 01/09/2022   ALBUMINELP 3.5 01/09/2022   A1GS 0.3 01/09/2022   A2GS 0.9 01/09/2022   BETS 0.9 01/09/2022   GAMS 0.4 01/09/2022   MSPIKE 0.4 (H) 01/09/2022   SPEI Comment 01/28/2018     Chemistry      Component Value Date/Time   NA 141 02/06/2022 0904   NA 148 (H) 07/16/2017 1128   NA 143 03/05/2017 1051   K 4.1 02/06/2022 0904   K 3.8 07/16/2017 1128   K 3.7 03/05/2017 1051   CL 108 02/06/2022 0904   CL 106 07/16/2017 1128   CO2 24 02/06/2022 0904   CO2 29 07/16/2017 1128    CO2 25 03/05/2017 1051   BUN 23 02/06/2022 0904   BUN 23 (H) 07/16/2017 1128   BUN 18.2 03/05/2017 1051   CREATININE 0.63 02/06/2022 0904   CREATININE 0.75 06/20/2020 1127   CREATININE 0.8 03/05/2017 1051      Component Value Date/Time   CALCIUM 9.7 02/06/2022 0904   CALCIUM 9.7 07/16/2017 1128   CALCIUM 8.9 03/05/2017 1051   ALKPHOS 66 02/06/2022 0904   ALKPHOS 71 07/16/2017 1128   ALKPHOS 67 03/05/2017 1051   AST 11 (L) 02/06/2022 0904   AST 15 03/05/2017 1051   ALT 10 02/06/2022 0904   ALT 23 07/16/2017 1128   ALT 17 03/05/2017 1051   BILITOT 0.6 02/06/2022 0904   BILITOT 0.55 03/05/2017 1051        Impression and Plan: Debra Ruiz is a very pleasant 76 yo caucasian female with IgG Kappa Myeloma.   I just feel bad that she is having issues with the back in the eye.  Clearly, her back is causing the most problems for her right now.  Again if she needed back surgery, which I would think she would, she has my clearance.  I do not see a problem from a hematologic or oncologic point of view.  Her immune system is adequate for her to heal and have a low risk of infection.  Hopefully, the lateral rectal palsy for the right eye will improve.  We will plan to get her back to see [place] in another month.  She likes to have monthly appointments.  This seems to work for her.   [name], MD 7/19/202310:14 AM

## 2022-02-06 NOTE — Addendum Note (Signed)
Addended by: Burney Gauze R on: 02/06/2022 12:07 PM   Modules accepted: Orders

## 2022-02-07 LAB — KAPPA/LAMBDA LIGHT CHAINS
Kappa free light chain: 27.9 mg/L — ABNORMAL HIGH (ref 3.3–19.4)
Kappa, lambda light chain ratio: >18.6 — ABNORMAL HIGH (ref 0.26–1.65)
Lambda free light chains: <1.5 mg/L — ABNORMAL LOW (ref 5.7–26.3)

## 2022-02-07 LAB — IGG, IGA, IGM
IgA: <5 mg/dL — ABNORMAL LOW (ref 64–422)
IgG (Immunoglobin G), Serum: 533 mg/dL — ABNORMAL LOW (ref 586–1602)
IgM (Immunoglobulin M), Srm: 6 mg/dL — ABNORMAL LOW (ref 26–217)

## 2022-02-08 ENCOUNTER — Telehealth: Payer: Self-pay | Admitting: Internal Medicine

## 2022-02-08 NOTE — Telephone Encounter (Signed)
Done. Shari Heritage faxed back notating that as well

## 2022-02-08 NOTE — Telephone Encounter (Signed)
I called to try and schedule patient for pre op appointment and she said she cant schedule right now because she cant walk and theres no way for her to come

## 2022-02-11 ENCOUNTER — Other Ambulatory Visit: Payer: Self-pay

## 2022-02-11 LAB — PROTEIN ELECTROPHORESIS, SERUM, WITH REFLEX
A/G Ratio: 1.3 (ref 0.7–1.7)
Albumin ELP: 3.3 g/dL (ref 2.9–4.4)
Alpha-1-Globulin: 0.3 g/dL (ref 0.0–0.4)
Alpha-2-Globulin: 0.9 g/dL (ref 0.4–1.0)
Beta Globulin: 0.9 g/dL (ref 0.7–1.3)
Gamma Globulin: 0.5 g/dL (ref 0.4–1.8)
Globulin, Total: 2.6 g/dL (ref 2.2–3.9)
M-Spike, %: 0.4 g/dL — ABNORMAL HIGH
SPEP Interpretation: 0
Total Protein ELP: 5.9 g/dL — ABNORMAL LOW (ref 6.0–8.5)

## 2022-02-11 LAB — IMMUNOFIXATION REFLEX, SERUM
IgA: <5 mg/dL — ABNORMAL LOW (ref 64–422)
IgG (Immunoglobin G), Serum: 611 mg/dL (ref 586–1602)
IgM (Immunoglobulin M), Srm: 8 mg/dL — ABNORMAL LOW (ref 26–217)

## 2022-02-18 ENCOUNTER — Other Ambulatory Visit: Payer: Self-pay | Admitting: Hematology & Oncology

## 2022-02-19 ENCOUNTER — Other Ambulatory Visit: Payer: Self-pay

## 2022-02-19 DIAGNOSIS — M48062 Spinal stenosis, lumbar region with neurogenic claudication: Secondary | ICD-10-CM | POA: Diagnosis not present

## 2022-02-19 DIAGNOSIS — M5134 Other intervertebral disc degeneration, thoracic region: Secondary | ICD-10-CM | POA: Diagnosis not present

## 2022-02-19 DIAGNOSIS — M5416 Radiculopathy, lumbar region: Secondary | ICD-10-CM | POA: Diagnosis not present

## 2022-02-21 ENCOUNTER — Telehealth: Payer: Self-pay

## 2022-02-21 NOTE — Telephone Encounter (Signed)
Patient's medication list mailed to her home at patient request. dph

## 2022-02-25 DIAGNOSIS — G8929 Other chronic pain: Secondary | ICD-10-CM | POA: Diagnosis not present

## 2022-02-25 DIAGNOSIS — M4800 Spinal stenosis, site unspecified: Secondary | ICD-10-CM | POA: Diagnosis not present

## 2022-02-25 DIAGNOSIS — M542 Cervicalgia: Secondary | ICD-10-CM | POA: Diagnosis not present

## 2022-02-25 DIAGNOSIS — Z0130 Encounter for examination of blood pressure without abnormal findings: Secondary | ICD-10-CM | POA: Diagnosis not present

## 2022-02-25 DIAGNOSIS — M810 Age-related osteoporosis without current pathological fracture: Secondary | ICD-10-CM | POA: Diagnosis not present

## 2022-02-25 DIAGNOSIS — M129 Arthropathy, unspecified: Secondary | ICD-10-CM | POA: Diagnosis not present

## 2022-02-25 DIAGNOSIS — Z6820 Body mass index (BMI) 20.0-20.9, adult: Secondary | ICD-10-CM | POA: Diagnosis not present

## 2022-02-25 DIAGNOSIS — C9000 Multiple myeloma not having achieved remission: Secondary | ICD-10-CM | POA: Diagnosis not present

## 2022-02-25 DIAGNOSIS — F1120 Opioid dependence, uncomplicated: Secondary | ICD-10-CM | POA: Diagnosis not present

## 2022-02-25 DIAGNOSIS — H532 Diplopia: Secondary | ICD-10-CM | POA: Diagnosis not present

## 2022-02-25 DIAGNOSIS — M549 Dorsalgia, unspecified: Secondary | ICD-10-CM | POA: Diagnosis not present

## 2022-02-25 DIAGNOSIS — Z79899 Other long term (current) drug therapy: Secondary | ICD-10-CM | POA: Diagnosis not present

## 2022-03-05 DIAGNOSIS — F1120 Opioid dependence, uncomplicated: Secondary | ICD-10-CM | POA: Diagnosis not present

## 2022-03-05 DIAGNOSIS — Z0130 Encounter for examination of blood pressure without abnormal findings: Secondary | ICD-10-CM | POA: Diagnosis not present

## 2022-03-05 DIAGNOSIS — M4800 Spinal stenosis, site unspecified: Secondary | ICD-10-CM | POA: Diagnosis not present

## 2022-03-05 DIAGNOSIS — F32A Depression, unspecified: Secondary | ICD-10-CM | POA: Diagnosis not present

## 2022-03-05 DIAGNOSIS — G8929 Other chronic pain: Secondary | ICD-10-CM | POA: Diagnosis not present

## 2022-03-05 DIAGNOSIS — C9000 Multiple myeloma not having achieved remission: Secondary | ICD-10-CM | POA: Diagnosis not present

## 2022-03-05 DIAGNOSIS — H532 Diplopia: Secondary | ICD-10-CM | POA: Diagnosis not present

## 2022-03-05 DIAGNOSIS — M47819 Spondylosis without myelopathy or radiculopathy, site unspecified: Secondary | ICD-10-CM | POA: Diagnosis not present

## 2022-03-05 DIAGNOSIS — M129 Arthropathy, unspecified: Secondary | ICD-10-CM | POA: Diagnosis not present

## 2022-03-05 DIAGNOSIS — F419 Anxiety disorder, unspecified: Secondary | ICD-10-CM | POA: Diagnosis not present

## 2022-03-05 DIAGNOSIS — Z6820 Body mass index (BMI) 20.0-20.9, adult: Secondary | ICD-10-CM | POA: Diagnosis not present

## 2022-03-05 DIAGNOSIS — M542 Cervicalgia: Secondary | ICD-10-CM | POA: Diagnosis not present

## 2022-03-05 DIAGNOSIS — Z79899 Other long term (current) drug therapy: Secondary | ICD-10-CM | POA: Diagnosis not present

## 2022-03-06 ENCOUNTER — Other Ambulatory Visit: Payer: Self-pay

## 2022-03-06 ENCOUNTER — Encounter: Payer: Self-pay | Admitting: Hematology & Oncology

## 2022-03-06 ENCOUNTER — Inpatient Hospital Stay: Payer: PPO

## 2022-03-06 ENCOUNTER — Inpatient Hospital Stay: Payer: PPO | Attending: Hematology & Oncology

## 2022-03-06 ENCOUNTER — Inpatient Hospital Stay (HOSPITAL_BASED_OUTPATIENT_CLINIC_OR_DEPARTMENT_OTHER): Payer: PPO | Admitting: Hematology & Oncology

## 2022-03-06 VITALS — BP 139/83 | HR 88 | Temp 97.6°F | Resp 20 | Ht 59.0 in | Wt 107.0 lb

## 2022-03-06 VITALS — BP 105/58 | HR 75 | Resp 17

## 2022-03-06 DIAGNOSIS — C9000 Multiple myeloma not having achieved remission: Secondary | ICD-10-CM

## 2022-03-06 DIAGNOSIS — Z5112 Encounter for antineoplastic immunotherapy: Secondary | ICD-10-CM | POA: Diagnosis not present

## 2022-03-06 DIAGNOSIS — Z79899 Other long term (current) drug therapy: Secondary | ICD-10-CM | POA: Diagnosis not present

## 2022-03-06 DIAGNOSIS — D509 Iron deficiency anemia, unspecified: Secondary | ICD-10-CM | POA: Diagnosis not present

## 2022-03-06 LAB — CBC WITH DIFFERENTIAL (CANCER CENTER ONLY)
Abs Immature Granulocytes: 0.02 10*3/uL (ref 0.00–0.07)
Basophils Absolute: 0 10*3/uL (ref 0.0–0.1)
Basophils Relative: 0 %
Eosinophils Absolute: 0.1 10*3/uL (ref 0.0–0.5)
Eosinophils Relative: 2 %
HCT: 41.9 % (ref 36.0–46.0)
Hemoglobin: 13.9 g/dL (ref 12.0–15.0)
Immature Granulocytes: 0 %
Lymphocytes Relative: 17 %
Lymphs Abs: 1.1 10*3/uL (ref 0.7–4.0)
MCH: 33.3 pg (ref 26.0–34.0)
MCHC: 33.2 g/dL (ref 30.0–36.0)
MCV: 100.2 fL — ABNORMAL HIGH (ref 80.0–100.0)
Monocytes Absolute: 0.7 10*3/uL (ref 0.1–1.0)
Monocytes Relative: 11 %
Neutro Abs: 4.7 10*3/uL (ref 1.7–7.7)
Neutrophils Relative %: 70 %
Platelet Count: 206 10*3/uL (ref 150–400)
RBC: 4.18 MIL/uL (ref 3.87–5.11)
RDW: 12.9 % (ref 11.5–15.5)
WBC Count: 6.7 10*3/uL (ref 4.0–10.5)
nRBC: 0 % (ref 0.0–0.2)

## 2022-03-06 LAB — CMP (CANCER CENTER ONLY)
ALT: 9 U/L (ref 0–44)
AST: 12 U/L — ABNORMAL LOW (ref 15–41)
Albumin: 4.2 g/dL (ref 3.5–5.0)
Alkaline Phosphatase: 66 U/L (ref 38–126)
Anion gap: 9 (ref 5–15)
BUN: 14 mg/dL (ref 8–23)
CO2: 26 mmol/L (ref 22–32)
Calcium: 9.4 mg/dL (ref 8.9–10.3)
Chloride: 104 mmol/L (ref 98–111)
Creatinine: 0.76 mg/dL (ref 0.44–1.00)
GFR, Estimated: >60 mL/min (ref >60)
Glucose, Bld: 142 mg/dL — ABNORMAL HIGH (ref 70–99)
Potassium: 4.1 mmol/L (ref 3.5–5.1)
Sodium: 139 mmol/L (ref 135–145)
Total Bilirubin: 0.5 mg/dL (ref 0.3–1.2)
Total Protein: 6.7 g/dL (ref 6.5–8.1)

## 2022-03-06 LAB — LACTATE DEHYDROGENASE: LDH: 198 U/L — ABNORMAL HIGH (ref 98–192)

## 2022-03-06 MED ORDER — SODIUM CHLORIDE 0.9 % IV SOLN
40.0000 mg | Freq: Once | INTRAVENOUS | Status: AC
  Administered 2022-03-06: 40 mg via INTRAVENOUS
  Filled 2022-03-06: qty 4

## 2022-03-06 MED ORDER — DENOSUMAB 120 MG/1.7ML ~~LOC~~ SOLN: 120.0000 mg | Freq: Once | SUBCUTANEOUS | Status: DC

## 2022-03-06 MED ORDER — SODIUM CHLORIDE 0.9% FLUSH: 10.0000 mL | INTRAVENOUS | Status: DC | PRN

## 2022-03-06 MED ORDER — DIPHENHYDRAMINE HCL 25 MG PO CAPS: 25.0000 mg | ORAL_CAPSULE | Freq: Once | ORAL | Status: DC

## 2022-03-06 MED ORDER — SODIUM CHLORIDE 0.9 % IV SOLN: Freq: Once | INTRAVENOUS | Status: DC

## 2022-03-06 MED ORDER — DARATUMUMAB-HYALURONIDASE-FIHJ 1800-30000 MG-UT/15ML ~~LOC~~ SOLN
1800.0000 mg | Freq: Once | SUBCUTANEOUS | Status: AC
  Administered 2022-03-06: 1800 mg via SUBCUTANEOUS
  Filled 2022-03-06: qty 15

## 2022-03-06 MED ORDER — DEXTROSE 5 % IV SOLN
70.0000 mg/m2 | Freq: Once | INTRAVENOUS | Status: AC
  Administered 2022-03-06: 100 mg via INTRAVENOUS
  Filled 2022-03-06: qty 30

## 2022-03-06 MED ORDER — SODIUM CHLORIDE 0.9 % IV SOLN: Freq: Once | INTRAVENOUS | Status: AC

## 2022-03-06 MED ORDER — ACETAMINOPHEN 325 MG PO TABS: 650.0000 mg | ORAL_TABLET | Freq: Once | ORAL | Status: DC

## 2022-03-06 MED ORDER — HEPARIN SOD (PORK) LOCK FLUSH 100 UNIT/ML IV SOLN: 500.0000 [IU] | Freq: Once | INTRAVENOUS | Status: DC | PRN

## 2022-03-06 NOTE — Progress Notes (Signed)
Hematology and Oncology Follow Up Visit  Debra Ruiz [id number] 03-30-1946 76 y.o. 03/06/2022   Principle Diagnosis:  IgG Kappa myeloma - compression fractures - 13q-, +11, 14q+ - hyperdiploid -- progressive Iron def anemia  Completed Therapy: S/p Kyphoplasty of T11 and T12 XRT to lower spine-completed on 09/25/2016  Current Therapy:    Faspro -- start on 04/05/2020 -- s/p cycle #25 -- Kyprolis added on 07/20/2020 -- s/p cycle #21 Ninlaro 2.3 mg po q week (3 on/1 off) -- start on 01/24/2020 -- d/c on 03/29/2020 due to toxicity Velcade/Pomalyst/Decadron -Start on 09/02/2018 --Pomalyst on hold since 11/09/2018.  D/C on 01/17/2020 Xgeva 119m Sq q  3 month - next dose in 05/2022  IV Injectafer -- last dose given on 11/2018   Interim History:  Ms. JTrenkampis here today for follow-up.  She is still incredibly affected by her back.  This is really the"bane of her existence."  I will have to talk to her spinal surgeon again.  She was sent to a pain clinic which was totally useless to her.  She just wants to be able to walk better again.  Her last functional studies showed an M spike of 0.4 g/dL.  Her IgG level was 611 mg/dL.  Her kappa light chain was up a little bit at 2.8 mg/dL.  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  This does seem to be getting a little bit better.  As far as the actual chemotherapy, she is tolerated this incredibly well.  She has had no problems with nausea or vomiting.  She has had no change in bowel or bladder habits.  Again, her problem which she is only worried about is her poor lower back.  Overall, I would say performance status is probably ECOG 2.   Medications:  Allergies as of 03/06/2022       Reactions   Crestor [rosuvastatin Calcium] Other (See Comments)   Causes liver functions to be elevated        Medication List        Accurate as of March 06, 2022  9:40 AM. If you have any questions, ask your nurse or doctor.           ALPRAZolam 0.5 MG tablet Commonly known as: XANAX Take 1 tablet (0.5 mg total) by mouth 2 (two) times daily as needed for anxiety.   amLODipine 5 MG tablet Commonly known as: NORVASC TAKE 1 TABLET(5 MG) BY MOUTH DAILY   aspirin EC 81 MG tablet Take 81 mg by mouth every other day.   atorvastatin 20 MG tablet Commonly known as: LIPITOR TAKE 1 TABLET(20 MG) BY MOUTH DAILY   buPROPion 300 MG 24 hr tablet Commonly known as: WELLBUTRIN XL TAKE 1 TABLET(300 MG) BY MOUTH DAILY   dexamethasone 4 MG tablet Commonly known as: DECADRON TAKE 3 TABLETS BY MOUTH 1 TIME A WEEK   escitalopram 20 MG tablet Commonly known as: LEXAPRO TAKE 1 TABLET(20 MG) BY MOUTH DAILY   famciclovir 250 MG tablet Commonly known as: FAMVIR TAKE 1 TABLET(250 MG) BY MOUTH DAILY   Flarex 0.1 % ophthalmic suspension Generic drug: fluorometholone   hydrOXYzine 25 MG capsule Commonly known as: VISTARIL Take 1 capsule (25 mg total) by mouth every 8 (eight) hours as needed.   nebivolol 5 MG tablet Commonly known as: Bystolic TAKE 1 TABLET(5 MG) BY MOUTH DAILY   Oxervate 0.002 % Soln Generic drug: Cenegermin-bkbj   oxybutynin 10 MG 24 hr tablet  Commonly known as: DITROPAN-XL Take 1 tablet (10 mg total) by mouth at bedtime.   oxyCODONE-acetaminophen 5-325 MG tablet Commonly known as: PERCOCET/ROXICET Take 1 tablet by mouth every 4 (four) hours as needed.   potassium chloride SA 20 MEQ tablet Commonly known as: KLOR-CON M TAKE 1 TABLET BY MOUTH DAILY   prochlorperazine 10 MG tablet Commonly known as: COMPAZINE TAKE 1 TABLET(10 MG) BY MOUTH EVERY 6 HOURS AS NEEDED FOR NAUSEA OR VOMITING   REFRESH DRY EYE THERAPY OP Apply to eye 3 (three) times daily.   traMADol 50 MG tablet Commonly known as: ULTRAM Take 1 tablet (50 mg total) by mouth every 6 (six) hours as needed.   triamcinolone 0.1 % cream : eucerin Crea triamcinolone cr. 0.1%/eucerin cr.  APPLY TO SKIN TWICE DAILY FOR ITCHING    Vitamin B-12 5000 MCG Tbdp Take by mouth daily.   Vitamin D 50 MCG (2000 UT) Caps Take 2,000 Units by mouth daily.   zolpidem 10 MG tablet Commonly known as: AMBIEN TAKE 1 TABLET(10 MG) BY MOUTH AT BEDTIME        Allergies:  Allergies  Allergen Reactions   Crestor [Rosuvastatin Calcium] Other (See Comments)    Causes liver functions to be elevated    Past Medical History, Surgical history, Social history, and Family History were reviewed and updated.  Review of Systems: Review of Systems  Constitutional: Negative.   HENT: Negative.    Eyes:  Positive for double vision.  Respiratory: Negative.    Cardiovascular: Negative.   Gastrointestinal: Negative.   Genitourinary: Negative.   Musculoskeletal:  Positive for back pain.  Skin: Negative.   Neurological: Negative.   Endo/Heme/Allergies: Negative.   Psychiatric/Behavioral: Negative.      Physical Exam:  vitals were not taken for this visit.   Wt Readings from Last 3 Encounters:  02/06/22 106 lb 6.4 oz (48.3 kg)  01/09/22 106 lb 6.4 oz (48.3 kg)  12/12/21 108 lb (49 kg)    Physical Exam Vitals reviewed.  HENT:     Head: Normocephalic and atraumatic.  Eyes:     Pupils: Pupils are equal, round, and reactive to light.     Comments: Her ocular exam shows a patch over her right eye.  Cardiovascular:     Rate and Rhythm: Normal rate and regular rhythm.     Heart sounds: Normal heart sounds.  Pulmonary:     Effort: Pulmonary effort is normal.     Breath sounds: Normal breath sounds.  Abdominal:     General: Bowel sounds are normal.     Palpations: Abdomen is soft.  Musculoskeletal:        General: No tenderness or deformity. Normal range of motion.     Cervical back: Normal range of motion.  Lymphadenopathy:     Cervical: No cervical adenopathy.  Skin:    General: Skin is warm and dry.     Findings: No erythema or rash.  Neurological:     Mental Status: She is alert and oriented to person, place, and  time.  Psychiatric:        Behavior: Behavior normal.        Thought Content: Thought content normal.        Judgment: Judgment normal.      Lab Results  Component Value Date   WBC 6.7 03/06/2022   HGB 13.9 03/06/2022   HCT 41.9 03/06/2022   MCV 100.2 (H) 03/06/2022   PLT 206 03/06/2022   Lab Results  Component  Value Date   FERRITIN 258 10/25/2020   IRON 136 10/25/2020   TIBC 353 10/25/2020   UIBC 216 10/25/2020   IRONPCTSAT 39 10/25/2020   Lab Results  Component Value Date   RBC 4.18 03/06/2022   Lab Results  Component Value Date   KPAFRELGTCHN 27.9 (H) 02/06/2022   LAMBDASER <1.5 (L) 02/06/2022   KAPLAMBRATIO >18.60 (H) 02/06/2022   Lab Results  Component Value Date   IGGSERUM 611 02/06/2022   IGA <5 (L) 02/06/2022   IGMSERUM 8 (L) 02/06/2022   Lab Results  Component Value Date   TOTALPROTELP 5.9 (L) 02/06/2022   ALBUMINELP 3.3 02/06/2022   A1GS 0.3 02/06/2022   A2GS 0.9 02/06/2022   BETS 0.9 02/06/2022   GAMS 0.5 02/06/2022   MSPIKE 0.4 (H) 02/06/2022   SPEI Comment 01/28/2018     Chemistry      Component Value Date/Time   NA 139 03/06/2022 0900   NA 148 (H) 07/16/2017 1128   NA 143 03/05/2017 1051   K 4.1 03/06/2022 0900   K 3.8 07/16/2017 1128   K 3.7 03/05/2017 1051   CL 104 03/06/2022 0900   CL 106 07/16/2017 1128   CO2 26 03/06/2022 0900   CO2 29 07/16/2017 1128   CO2 25 03/05/2017 1051   BUN 14 03/06/2022 0900   BUN 23 (H) 07/16/2017 1128   BUN 18.2 03/05/2017 1051   CREATININE 0.76 03/06/2022 0900   CREATININE 0.75 06/20/2020 1127   CREATININE 0.8 03/05/2017 1051      Component Value Date/Time   CALCIUM 9.4 03/06/2022 0900   CALCIUM 9.7 07/16/2017 1128   CALCIUM 8.9 03/05/2017 1051   ALKPHOS 66 03/06/2022 0900   ALKPHOS 71 07/16/2017 1128   ALKPHOS 67 03/05/2017 1051   AST 12 (L) 03/06/2022 0900   AST 15 03/05/2017 1051   ALT 9 03/06/2022 0900   ALT 23 07/16/2017 1128   ALT 17 03/05/2017 1051   BILITOT 0.5 03/06/2022 0900    BILITOT 0.55 03/05/2017 1051        Impression and Plan: Debra Ruiz is a very pleasant 76 yo caucasian female with IgG Kappa Myeloma.   I just feel bad that she is having issues with the lower back.  Again, I will talk to Dr. Lynann Bologna of Orthopedic Surgery.  Again from my point of view I do not see a problem with her having surgery.  I am sure there are other issues that he is worried about.  We will have to see how her myeloma studies look.  It is her kappa light chains that we follow closely.  She is on the Kyprolis/Faspro.  She only gets the Kyprolis once a month.  If we do find that the light chains are going up, we may have to think about given the Kyprolis more often.  For right now, we will plan to get her back in 1 month.  Maybe, just may be, she would have had surgery by then.   [name], MD 8/16/20239:40 AM

## 2022-03-06 NOTE — Addendum Note (Signed)
Addended by: Burney Gauze R on: 03/06/2022 01:26 PM   Modules accepted: Orders

## 2022-03-06 NOTE — Patient Instructions (Signed)
[facility]  Discharge Instructions: Thank you for choosing [facility] to provide your oncology and hematology care.   If you have a lab appointment with the [facility], please go directly to the [facility] and check in at the registration area.  Wear comfortable clothing and clothing appropriate for easy access to any Portacath or PICC line.   We strive to give you quality time with your provider. You may need to reschedule your appointment if you arrive late (15 or more minutes).  Arriving late affects you and other patients whose appointments are after yours.  Also, if you miss three or more appointments without notifying the office, you may be dismissed from the clinic at the provider's discretion.      For prescription refill requests, have your pharmacy contact our office and allow 72 hours for refills to be completed.    Today you received the following chemotherapy and/or immunotherapy agents darzalex and kyprolis     To help prevent nausea and vomiting after your treatment, we encourage you to take your nausea medication as directed.  BELOW ARE SYMPTOMS THAT SHOULD BE REPORTED IMMEDIATELY: *FEVER GREATER THAN 100.4 F (38 C) OR HIGHER *CHILLS OR SWEATING *NAUSEA AND VOMITING THAT IS NOT CONTROLLED WITH YOUR NAUSEA MEDICATION *UNUSUAL SHORTNESS OF BREATH *UNUSUAL BRUISING OR BLEEDING *URINARY PROBLEMS (pain or burning when urinating, or frequent urination) *BOWEL PROBLEMS (unusual diarrhea, constipation, pain near the anus) TENDERNESS IN MOUTH AND THROAT WITH OR WITHOUT PRESENCE OF ULCERS (sore throat, sores in mouth, or a toothache) UNUSUAL RASH, SWELLING OR PAIN  UNUSUAL VAGINAL DISCHARGE OR ITCHING   Items with * indicate a potential emergency and should be followed up as soon as possible or go to the Emergency Department if any problems should occur.  Please show the CHEMOTHERAPY ALERT CARD or IMMUNOTHERAPY ALERT CARD at  check-in to the Emergency Department and triage nurse. Should you have questions after your visit or need to cancel or reschedule your appointment, please contact [facility]  [phone] and follow the prompts.  Office hours are 8:00 a.m. to 4:30 p.m. Monday - Friday. Please note that voicemails left after 4:00 p.m. may not be returned until the following business day.  We are closed weekends and major holidays. You have access to a nurse at all times for urgent questions. Please call the main number to the clinic [phone] and follow the prompts.  For any non-urgent questions, you may also contact your provider using MyChart. We now offer e-Visits for anyone 34 and older to request care online for non-urgent symptoms. For details visit [email or web address].   Also download the MyChart app! Go to the app store, search "MyChart", open the app, select [facility], and log in with your MyChart username and password.  Due to Covid, a mask is required upon entering the hospital/clinic. If you do not have a mask, one will be given to you upon arrival. For doctor visits, patients may have 1 support person aged 15 or older with them. For treatment visits, patients cannot have anyone with them due to current Covid guidelines and our immunocompromised population.   Carfilzomib injection What is this medication? CARFILZOMIB (kar FILZ oh mib) targets a specific protein within cancer cells and stops the cancer cells from growing. It is used to treat multiple myeloma. This medicine may be used for other purposes; ask your health care provider or pharmacist if you have questions. COMMON  BRAND NAME(S): KYPROLIS What should I tell my care team before I take this medication? They need to know if you have any of these conditions: heart disease history of blood clots irregular heartbeat kidney disease liver disease lung or breathing disease an unusual or allergic reaction to  carfilzomib, or other medicines, foods, dyes, or preservatives pregnant or trying to get pregnant breast-feeding How should I use this medication? This medicine is for injection or infusion into a vein. It is given by a health care professional in a hospital or clinic setting. Talk to your pediatrician regarding the use of this medicine in children. Special care may be needed. Overdosage: If you think you have taken too much of this medicine contact a poison control center or emergency room at once. NOTE: This medicine is only for you. Do not share this medicine with others. What if I miss a dose? It is important not to miss your dose. Call your doctor or health care professional if you are unable to keep an appointment. What may interact with this medication? Interactions are not expected. This list may not describe all possible interactions. Give your health care provider a list of all the medicines, herbs, non-prescription drugs, or dietary supplements you use. Also tell them if you smoke, drink alcohol, or use illegal drugs. Some items may interact with your medicine. What should I watch for while using this medication? Your condition will be monitored while you are receiving this medicine. You may need blood work done while you are taking this medicine. Do not become pregnant while taking this medicine or for 6 months after stopping it. Women should inform their health care provider if they wish to become pregnant or think they might be pregnant. Men should not father a child while taking this medicine and for 3 months after stopping it. There is a potential for serious side effects to an unborn child. Talk to your health care provider for more information. Do not breast-feed an infant while taking this medicine or for 2 weeks after stopping it. Check with your health care provider if you have severe diarrhea, nausea, and vomiting, or if you sweat a lot. The loss of too much body fluid may make  it dangerous for you to take this medicine. You may get drowsy or dizzy. Do not drive, use machinery, or do anything that needs mental alertness until you know how this medicine affects you. Do not stand up or sit up quickly, especially if you are an older patient. This reduces the risk of dizzy or fainting spells. What side effects may I notice from receiving this medication? Side effects that you should report to your doctor or health care professional as soon as possible: allergic reactions like skin rash, itching or hives, swelling of the face, lips, or tongue confusion dizziness feeling faint or lightheaded fever or chills palpitations seizures signs and symptoms of bleeding such as bloody or black, tarry stools; red or dark-brown urine; spitting up blood or brown material that looks like coffee grounds; red spots on the skin; unusual bruising or bleeding including from the eye, gums, or nose signs and symptoms of a blood clot such as breathing problems; changes in vision; chest pain; severe, sudden headache; pain, swelling, warmth in the leg; trouble speaking; sudden numbness or weakness of the face, arm or leg signs and symptoms of kidney injury like trouble passing urine or change in the amount of urine signs and symptoms of liver injury like dark yellow or  brown urine; general ill feeling or flu-like symptoms; light-colored stools; loss of appetite; nausea; right upper belly pain; unusually weak or tired; yellowing of the eyes or skin Side effects that usually do not require medical attention (report to your doctor or health care professional if they continue or are bothersome): back pain cough diarrhea headache muscle cramps trouble sleeping vomiting This list may not describe all possible side effects. Call your doctor for medical advice about side effects. You may report side effects to FDA at 1-800-FDA-1088. Where should I keep my medication? This drug is given in a hospital or  clinic and will not be stored at home. NOTE: This sheet is a summary. It may not cover all possible information. If you have questions about this medicine, talk to your doctor, pharmacist, or health care provider.  2023 Elsevier/Gold Standard (2020-08-17 00:00:00)

## 2022-03-07 LAB — KAPPA/LAMBDA LIGHT CHAINS
Kappa free light chain: 52.9 mg/L — ABNORMAL HIGH (ref 3.3–19.4)
Kappa, lambda light chain ratio: >35.27 — ABNORMAL HIGH (ref 0.26–1.65)
Lambda free light chains: <1.5 mg/L — ABNORMAL LOW (ref 5.7–26.3)

## 2022-03-07 LAB — IGG, IGA, IGM
IgA: <5 mg/dL — ABNORMAL LOW (ref 64–422)
IgG (Immunoglobin G), Serum: 721 mg/dL (ref 586–1602)
IgM (Immunoglobulin M), Srm: <5 mg/dL — ABNORMAL LOW (ref 26–217)

## 2022-03-09 ENCOUNTER — Other Ambulatory Visit: Payer: Self-pay | Admitting: Hematology & Oncology

## 2022-03-10 ENCOUNTER — Other Ambulatory Visit: Payer: Self-pay | Admitting: Internal Medicine

## 2022-03-11 ENCOUNTER — Encounter: Payer: Self-pay | Admitting: Hematology & Oncology

## 2022-03-11 LAB — PROTEIN ELECTROPHORESIS, SERUM, WITH REFLEX
A/G Ratio: 1.5 (ref 0.7–1.7)
Albumin ELP: 3.7 g/dL (ref 2.9–4.4)
Alpha-1-Globulin: 0.3 g/dL (ref 0.0–0.4)
Alpha-2-Globulin: 0.7 g/dL (ref 0.4–1.0)
Beta Globulin: 0.9 g/dL (ref 0.7–1.3)
Gamma Globulin: 0.6 g/dL (ref 0.4–1.8)
Globulin, Total: 2.4 g/dL (ref 2.2–3.9)
M-Spike, %: 0.4 g/dL — ABNORMAL HIGH
SPEP Interpretation: 0
Total Protein ELP: 6.1 g/dL (ref 6.0–8.5)

## 2022-03-11 LAB — IMMUNOFIXATION REFLEX, SERUM
IgA: <5 mg/dL — ABNORMAL LOW (ref 64–422)
IgG (Immunoglobin G), Serum: 792 mg/dL (ref 586–1602)
IgM (Immunoglobulin M), Srm: <5 mg/dL — ABNORMAL LOW (ref 26–217)

## 2022-03-12 DIAGNOSIS — Z79899 Other long term (current) drug therapy: Secondary | ICD-10-CM | POA: Diagnosis not present

## 2022-03-22 DIAGNOSIS — H532 Diplopia: Secondary | ICD-10-CM | POA: Diagnosis not present

## 2022-03-22 DIAGNOSIS — H4921 Sixth [abducent] nerve palsy, right eye: Secondary | ICD-10-CM | POA: Diagnosis not present

## 2022-03-22 DIAGNOSIS — Z8572 Personal history of non-Hodgkin lymphomas: Secondary | ICD-10-CM | POA: Diagnosis not present

## 2022-03-22 DIAGNOSIS — H5000 Unspecified esotropia: Secondary | ICD-10-CM | POA: Diagnosis not present

## 2022-03-23 ENCOUNTER — Other Ambulatory Visit: Payer: Self-pay | Admitting: Internal Medicine

## 2022-03-26 ENCOUNTER — Other Ambulatory Visit: Payer: Self-pay | Admitting: Internal Medicine

## 2022-04-01 ENCOUNTER — Other Ambulatory Visit: Payer: PPO

## 2022-04-02 NOTE — Progress Notes (Signed)
Dexamethasone 20 mg IV added to patient's premedications per Dr. Antonieta Pert instructions.

## 2022-04-03 ENCOUNTER — Inpatient Hospital Stay: Payer: PPO

## 2022-04-03 ENCOUNTER — Other Ambulatory Visit: Payer: Self-pay

## 2022-04-03 ENCOUNTER — Inpatient Hospital Stay: Payer: PPO | Attending: Hematology & Oncology | Admitting: Family

## 2022-04-03 ENCOUNTER — Encounter: Payer: Self-pay | Admitting: Family

## 2022-04-03 VITALS — BP 127/54 | HR 75

## 2022-04-03 DIAGNOSIS — D509 Iron deficiency anemia, unspecified: Secondary | ICD-10-CM | POA: Insufficient documentation

## 2022-04-03 DIAGNOSIS — Z5112 Encounter for antineoplastic immunotherapy: Secondary | ICD-10-CM | POA: Diagnosis not present

## 2022-04-03 DIAGNOSIS — C9000 Multiple myeloma not having achieved remission: Secondary | ICD-10-CM

## 2022-04-03 DIAGNOSIS — Z79899 Other long term (current) drug therapy: Secondary | ICD-10-CM | POA: Insufficient documentation

## 2022-04-03 LAB — CBC WITH DIFFERENTIAL (CANCER CENTER ONLY)
Abs Immature Granulocytes: 0.03 10*3/uL (ref 0.00–0.07)
Basophils Absolute: 0 10*3/uL (ref 0.0–0.1)
Basophils Relative: 0 %
Eosinophils Absolute: 0.1 10*3/uL (ref 0.0–0.5)
Eosinophils Relative: 1 %
HCT: 38.7 % (ref 36.0–46.0)
Hemoglobin: 12.9 g/dL (ref 12.0–15.0)
Immature Granulocytes: 0 %
Lymphocytes Relative: 9 %
Lymphs Abs: 0.9 10*3/uL (ref 0.7–4.0)
MCH: 33.6 pg (ref 26.0–34.0)
MCHC: 33.3 g/dL (ref 30.0–36.0)
MCV: 100.8 fL — ABNORMAL HIGH (ref 80.0–100.0)
Monocytes Absolute: 0.8 10*3/uL (ref 0.1–1.0)
Monocytes Relative: 8 %
Neutro Abs: 7.7 10*3/uL (ref 1.7–7.7)
Neutrophils Relative %: 82 %
Platelet Count: 215 10*3/uL (ref 150–400)
RBC: 3.84 MIL/uL — ABNORMAL LOW (ref 3.87–5.11)
RDW: 12.9 % (ref 11.5–15.5)
WBC Count: 9.5 10*3/uL (ref 4.0–10.5)
nRBC: 0 % (ref 0.0–0.2)

## 2022-04-03 LAB — CMP (CANCER CENTER ONLY)
ALT: 9 U/L (ref 0–44)
AST: 13 U/L — ABNORMAL LOW (ref 15–41)
Albumin: 4.3 g/dL (ref 3.5–5.0)
Alkaline Phosphatase: 64 U/L (ref 38–126)
Anion gap: 9 (ref 5–15)
BUN: 24 mg/dL — ABNORMAL HIGH (ref 8–23)
CO2: 25 mmol/L (ref 22–32)
Calcium: 9.2 mg/dL (ref 8.9–10.3)
Chloride: 105 mmol/L (ref 98–111)
Creatinine: 0.82 mg/dL (ref 0.44–1.00)
GFR, Estimated: >60 mL/min (ref >60)
Glucose, Bld: 79 mg/dL (ref 70–99)
Potassium: 4 mmol/L (ref 3.5–5.1)
Sodium: 139 mmol/L (ref 135–145)
Total Bilirubin: 0.6 mg/dL (ref 0.3–1.2)
Total Protein: 6.5 g/dL (ref 6.5–8.1)

## 2022-04-03 LAB — LACTATE DEHYDROGENASE: LDH: 192 U/L (ref 98–192)

## 2022-04-03 MED ORDER — DEXTROSE 5 % IV SOLN
70.0000 mg/m2 | Freq: Once | INTRAVENOUS | Status: AC
  Administered 2022-04-03: 100 mg via INTRAVENOUS
  Filled 2022-04-03: qty 30

## 2022-04-03 MED ORDER — SODIUM CHLORIDE 0.9 % IV SOLN: Freq: Once | INTRAVENOUS | Status: AC

## 2022-04-03 MED ORDER — DARATUMUMAB-HYALURONIDASE-FIHJ 1800-30000 MG-UT/15ML ~~LOC~~ SOLN
1800.0000 mg | Freq: Once | SUBCUTANEOUS | Status: AC
  Administered 2022-04-03: 1800 mg via SUBCUTANEOUS
  Filled 2022-04-03: qty 15

## 2022-04-03 MED ORDER — ACETAMINOPHEN 325 MG PO TABS: 650.0000 mg | ORAL_TABLET | Freq: Once | ORAL | Status: DC

## 2022-04-03 MED ORDER — PROCHLORPERAZINE MALEATE 10 MG PO TABS: 10.0000 mg | ORAL_TABLET | Freq: Once | ORAL | Status: DC

## 2022-04-03 MED ORDER — SODIUM CHLORIDE 0.9 % IV SOLN
20.0000 mg | Freq: Once | INTRAVENOUS | Status: AC
  Administered 2022-04-03: 20 mg via INTRAVENOUS
  Filled 2022-04-03: qty 20

## 2022-04-03 MED ORDER — DENOSUMAB 120 MG/1.7ML ~~LOC~~ SOLN
120.0000 mg | Freq: Once | SUBCUTANEOUS | Status: AC
  Administered 2022-04-03: 120 mg via SUBCUTANEOUS
  Filled 2022-04-03: qty 1.7

## 2022-04-03 MED ORDER — DIPHENHYDRAMINE HCL 25 MG PO CAPS: 25.0000 mg | ORAL_CAPSULE | Freq: Once | ORAL | Status: DC

## 2022-04-03 NOTE — Patient Instructions (Signed)
[facility] [place]  Discharge Instructions: Thank you for choosing [facility] to provide your oncology and hematology care.   If you have a lab appointment with the Cancer Center, please go directly to the Cancer Center and check in at the registration area.  Wear comfortable clothing and clothing appropriate for easy access to any Portacath or PICC line.   We strive to give you quality time with your provider. You may need to reschedule your appointment if you arrive late (15 or more minutes).  Arriving late affects you and other patients whose appointments are after yours.  Also, if you miss three or more appointments without notifying the office, you may be dismissed from the clinic at the provider's discretion.      For prescription refill requests, have your pharmacy contact our office and allow 72 hours for refills to be completed.    Today you received the following chemotherapy and/or immunotherapy agents:  Kyprolis and Faspro      To help prevent nausea and vomiting after your treatment, we encourage you to take your nausea medication as directed.  BELOW ARE SYMPTOMS THAT SHOULD BE REPORTED IMMEDIATELY: *FEVER GREATER THAN 100.4 F (38 C) OR HIGHER *CHILLS OR SWEATING *NAUSEA AND VOMITING THAT IS NOT CONTROLLED WITH YOUR NAUSEA MEDICATION *UNUSUAL SHORTNESS OF BREATH *UNUSUAL BRUISING OR BLEEDING *URINARY PROBLEMS (pain or burning when urinating, or frequent urination) *BOWEL PROBLEMS (unusual diarrhea, constipation, pain near the anus) TENDERNESS IN MOUTH AND THROAT WITH OR WITHOUT PRESENCE OF ULCERS (sore throat, sores in mouth, or a toothache) UNUSUAL RASH, SWELLING OR PAIN  UNUSUAL VAGINAL DISCHARGE OR ITCHING   Items with * indicate a potential emergency and should be followed up as soon as possible or go to the Emergency Department if any problems should occur.  Please show the CHEMOTHERAPY ALERT CARD or IMMUNOTHERAPY ALERT CARD at  check-in to the Emergency Department and triage nurse. Should you have questions after your visit or need to cancel or reschedule your appointment, please contact [facility]  [phone] and follow the prompts.  Office hours are 8:00 a.m. to 4:30 p.m. Monday - Friday. Please note that voicemails left after 4:00 p.m. may not be returned until the following business day.  We are closed weekends and major holidays. You have access to a nurse at all times for urgent questions. Please call the main number to the clinic [phone] and follow the prompts.  For any non-urgent questions, you may also contact your provider using MyChart. We now offer e-Visits for anyone 18 and older to request care online for non-urgent symptoms. For details visit [email or web address].   Also download the MyChart app! Go to the app store, search "MyChart", open the app, select [facility], and log in with your MyChart username and password.  Masks are optional in the cancer centers. If you would like for your care team to wear a mask while they are taking care of you, please let them know. You may have one support person who is at least 76 years old accompany you for your appointments. 

## 2022-04-03 NOTE — Progress Notes (Signed)
Hematology and Oncology Follow Up Visit  Debra Ruiz [id number] 1946/01/02 76 y.o. 04/03/2022   Principle Diagnosis:  IgG Kappa myeloma - compression fractures - 13q-, +11, 14q+ - hyperdiploid -- progressive Iron def anemia   Completed Therapy: S/p Kyphoplasty of T11 and T12 XRT to lower spine-completed on 09/25/2016 Velcade/Pomalyst/Decadron -Start on 09/02/2018 --Pomalyst on hold since 11/09/2018.  D/C on 01/17/2020 Ninlaro 2.3 mg po q week (3 on/1 off) -- start on 01/24/2020 -- d/c on 03/29/2020 due to toxicity   Current Therapy:        Faspro -- start on 04/05/2020 -- s/p cycle 25 -- Kyprolis added on 07/20/2020 -- s/p cycle 22 Xgeva $Remove'120mg'PIMqGHb$  Sq q  3 month - next dose in 05/2022  IV Iron as indicated   Interim History:  Ms. Debra Ruiz is here today for follow-up and treatment. She is still having a hard time with her balance and gait due to chronic lower back issues. She is ambulating with a Rolator for added support.  She states that she has seen both Dr. Lynann Bologna and Dr. Maxie Better and that she was told surgery is possible but would be "risky". She is just unsure what to do as she cares for her husband with alzheimer's.  No numbness, tingling or swelling in her extremities at this time.  She states that she had a stroke which has led to the issues with her right eye. She is currently wearing a patch on the left eye in the hopes this will strengthen her right eye. She has an appointment coming up with her eye specialist soon to determine if she will require eye surgery.  Last month, M-spike was 0.4 g/dL, IgG level was 721 mg/dL and Kappa light chains were 5.29 mg/dL.  No fever, chills, n/v, cough, rash, dizziness, SOB, chest pain, palpitations, abdominal pain or changes in bowel or bladder habits.  Appetite and hydration have been fairly good. Weight is 102 lbs.   ECOG Performance Status: 2 - Symptomatic, <50% confined to bed  Medications:  Allergies as of 04/03/2022       Reactions    Crestor [rosuvastatin Calcium] Other (See Comments)   Causes liver functions to be elevated        Medication List        Accurate as of April 03, 2022  9:54 AM. If you have any questions, ask your nurse or doctor.          ALPRAZolam 0.5 MG tablet Commonly known as: XANAX Take 1 tablet (0.5 mg total) by mouth 2 (two) times daily as needed for anxiety.   amLODipine 5 MG tablet Commonly known as: NORVASC TAKE 1 TABLET(5 MG) BY MOUTH DAILY   aspirin EC 81 MG tablet Take 81 mg by mouth every other day.   atorvastatin 20 MG tablet Commonly known as: LIPITOR TAKE 1 TABLET(20 MG) BY MOUTH DAILY   buPROPion 300 MG 24 hr tablet Commonly known as: WELLBUTRIN XL TAKE 1 TABLET(300 MG) BY MOUTH DAILY   dexamethasone 4 MG tablet Commonly known as: DECADRON TAKE 3 TABLETS BY MOUTH 1 TIME A WEEK   escitalopram 20 MG tablet Commonly known as: LEXAPRO TAKE 1 TABLET(20 MG) BY MOUTH DAILY   famciclovir 250 MG tablet Commonly known as: FAMVIR TAKE 1 TABLET(250 MG) BY MOUTH DAILY   Flarex 0.1 % ophthalmic suspension Generic drug: fluorometholone   hydrOXYzine 25 MG capsule Commonly known as: VISTARIL Take 1 capsule (25 mg total) by mouth every 8 (eight) hours as needed.  nebivolol 5 MG tablet Commonly known as: Bystolic TAKE 1 TABLET(5 MG) BY MOUTH DAILY   Oxervate 0.002 % Soln Generic drug: Cenegermin-bkbj   oxybutynin 10 MG 24 hr tablet Commonly known as: DITROPAN-XL Take 1 tablet (10 mg total) by mouth at bedtime.   oxyCODONE-acetaminophen 5-325 MG tablet Commonly known as: PERCOCET/ROXICET Take 1 tablet by mouth every 4 (four) hours as needed.   potassium chloride SA 20 MEQ tablet Commonly known as: KLOR-CON M TAKE 1 TABLET BY MOUTH DAILY   prochlorperazine 10 MG tablet Commonly known as: COMPAZINE TAKE 1 TABLET(10 MG) BY MOUTH EVERY 6 HOURS AS NEEDED FOR NAUSEA OR VOMITING   REFRESH DRY EYE THERAPY OP Apply to eye 3 (three) times daily.   traMADol  50 MG tablet Commonly known as: ULTRAM Take 1 tablet (50 mg total) by mouth every 6 (six) hours as needed.   triamcinolone 0.1 % cream : eucerin Crea triamcinolone cr. 0.1%/eucerin cr.  APPLY TO SKIN TWICE DAILY FOR ITCHING   Vitamin B-12 5000 MCG Tbdp Take by mouth daily.   Vitamin D 50 MCG (2000 UT) Caps Take 2,000 Units by mouth daily.   zolpidem 10 MG tablet Commonly known as: AMBIEN TAKE 1 TABLET(10 MG) BY MOUTH AT BEDTIME        Allergies:  Allergies  Allergen Reactions   Crestor [Rosuvastatin Calcium] Other (See Comments)    Causes liver functions to be elevated    Past Medical History, Surgical history, Social history, and Family History were reviewed and updated.  Review of Systems: All other 10 point review of systems is negative.   Physical Exam:  height is $RemoveB'4\' 11"'NOQYdbyr$  (1.499 m) and weight is 102 lb 1.9 oz (46.3 kg). Her oral temperature is 97.7 F (36.5 C). Her blood pressure is 127/52 (abnormal) and her pulse is 87. Her respiration is 20 and oxygen saturation is 100%.   Wt Readings from Last 3 Encounters:  04/03/22 102 lb 1.9 oz (46.3 kg)  03/06/22 107 lb (48.5 kg)  02/06/22 106 lb 6.4 oz (48.3 kg)    Ocular: Sclerae unicteric, pupils equal, round and reactive to light Ear-nose-throat: Oropharynx clear, dentition fair Lymphatic: No cervical or supraclavicular adenopathy Lungs no rales or rhonchi, good excursion bilaterally Heart regular rate and rhythm, no murmur appreciated Abd soft, nontender, positive bowel sounds MSK no focal spinal tenderness, no joint edema Neuro: non-focal, well-oriented, appropriate affect Breasts: Deferred   Lab Results  Component Value Date   WBC 9.5 04/03/2022   HGB 12.9 04/03/2022   HCT 38.7 04/03/2022   MCV 100.8 (H) 04/03/2022   PLT 215 04/03/2022   Lab Results  Component Value Date   FERRITIN 258 10/25/2020   IRON 136 10/25/2020   TIBC 353 10/25/2020   UIBC 216 10/25/2020   IRONPCTSAT 39 10/25/2020   Lab  Results  Component Value Date   RBC 3.84 (L) 04/03/2022   Lab Results  Component Value Date   KPAFRELGTCHN 52.9 (H) 03/06/2022   LAMBDASER <1.5 (L) 03/06/2022   KAPLAMBRATIO >35.27 (H) 03/06/2022   Lab Results  Component Value Date   IGGSERUM 721 03/06/2022   IGGSERUM 792 03/06/2022   IGA <5 (L) 03/06/2022   IGA <5 (L) 03/06/2022   IGMSERUM <5 (L) 03/06/2022   IGMSERUM <5 (L) 03/06/2022   Lab Results  Component Value Date   TOTALPROTELP 6.1 03/06/2022   ALBUMINELP 3.7 03/06/2022   A1GS 0.3 03/06/2022   A2GS 0.7 03/06/2022   BETS 0.9 03/06/2022   GAMS  0.6 03/06/2022   MSPIKE 0.4 (H) 03/06/2022   SPEI Comment 01/28/2018     Chemistry      Component Value Date/Time   NA 139 04/03/2022 0854   NA 148 (H) 07/16/2017 1128   NA 143 03/05/2017 1051   K 4.0 04/03/2022 0854   K 3.8 07/16/2017 1128   K 3.7 03/05/2017 1051   CL 105 04/03/2022 0854   CL 106 07/16/2017 1128   CO2 25 04/03/2022 0854   CO2 29 07/16/2017 1128   CO2 25 03/05/2017 1051   BUN 24 (H) 04/03/2022 0854   BUN 23 (H) 07/16/2017 1128   BUN 18.2 03/05/2017 1051   CREATININE 0.82 04/03/2022 0854   CREATININE 0.75 06/20/2020 1127   CREATININE 0.8 03/05/2017 1051      Component Value Date/Time   CALCIUM 9.2 04/03/2022 0854   CALCIUM 9.7 07/16/2017 1128   CALCIUM 8.9 03/05/2017 1051   ALKPHOS 64 04/03/2022 0854   ALKPHOS 71 07/16/2017 1128   ALKPHOS 67 03/05/2017 1051   AST 13 (L) 04/03/2022 0854   AST 15 03/05/2017 1051   ALT 9 04/03/2022 0854   ALT 23 07/16/2017 1128   ALT 17 03/05/2017 1051   BILITOT 0.6 04/03/2022 0854   BILITOT 0.55 03/05/2017 1051       Impression and Plan: Ms. Zacher is a very pleasant 76 yo caucasian female with IgG Kappa Myeloma.  We will proceed with treatment today as planned.  Protein studies pending.  Follow-up in 1 month.   Lottie Dawson, NP 9/13/20239:54 AM

## 2022-04-04 ENCOUNTER — Ambulatory Visit: Payer: PPO | Admitting: Hematology & Oncology

## 2022-04-04 ENCOUNTER — Ambulatory Visit: Payer: PPO

## 2022-04-04 ENCOUNTER — Inpatient Hospital Stay: Payer: PPO

## 2022-04-04 LAB — KAPPA/LAMBDA LIGHT CHAINS
Kappa free light chain: 34.1 mg/L — ABNORMAL HIGH (ref 3.3–19.4)
Kappa, lambda light chain ratio: >22.73 — ABNORMAL HIGH (ref 0.26–1.65)
Lambda free light chains: <1.5 mg/L — ABNORMAL LOW (ref 5.7–26.3)

## 2022-04-05 ENCOUNTER — Encounter: Payer: Self-pay | Admitting: Hematology & Oncology

## 2022-04-05 LAB — IGG, IGA, IGM
IgA: <5 mg/dL — ABNORMAL LOW (ref 64–422)
IgG (Immunoglobin G), Serum: 716 mg/dL (ref 586–1602)
IgM (Immunoglobulin M), Srm: <5 mg/dL — ABNORMAL LOW (ref 26–217)

## 2022-04-07 ENCOUNTER — Other Ambulatory Visit: Payer: Self-pay | Admitting: Internal Medicine

## 2022-04-10 LAB — IMMUNOFIXATION REFLEX, SERUM
IgA: <5 mg/dL — ABNORMAL LOW (ref 64–422)
IgG (Immunoglobin G), Serum: 765 mg/dL (ref 586–1602)
IgM (Immunoglobulin M), Srm: <5 mg/dL — ABNORMAL LOW (ref 26–217)

## 2022-04-10 LAB — PROTEIN ELECTROPHORESIS, SERUM, WITH REFLEX
A/G Ratio: 1.4 (ref 0.7–1.7)
Albumin ELP: 3.7 g/dL (ref 2.9–4.4)
Alpha-1-Globulin: 0.3 g/dL (ref 0.0–0.4)
Alpha-2-Globulin: 0.8 g/dL (ref 0.4–1.0)
Beta Globulin: 0.9 g/dL (ref 0.7–1.3)
Gamma Globulin: 0.7 g/dL (ref 0.4–1.8)
Globulin, Total: 2.7 g/dL (ref 2.2–3.9)
M-Spike, %: 0.5 g/dL — ABNORMAL HIGH
SPEP Interpretation: 0
Total Protein ELP: 6.4 g/dL (ref 6.0–8.5)

## 2022-04-13 ENCOUNTER — Other Ambulatory Visit: Payer: Self-pay | Admitting: Internal Medicine

## 2022-04-19 DIAGNOSIS — H532 Diplopia: Secondary | ICD-10-CM | POA: Diagnosis not present

## 2022-04-19 DIAGNOSIS — H4921 Sixth [abducent] nerve palsy, right eye: Secondary | ICD-10-CM | POA: Diagnosis not present

## 2022-04-21 ENCOUNTER — Other Ambulatory Visit: Payer: Self-pay | Admitting: Hematology & Oncology

## 2022-04-22 ENCOUNTER — Encounter: Payer: Self-pay | Admitting: Hematology & Oncology

## 2022-04-26 ENCOUNTER — Telehealth: Payer: Self-pay | Admitting: Internal Medicine

## 2022-04-26 MED ORDER — POTASSIUM CHLORIDE CRYS ER 20 MEQ PO TBCR: EXTENDED_RELEASE_TABLET | ORAL | 1 refills | Status: DC

## 2022-04-26 NOTE — Telephone Encounter (Addendum)
Received Fax RX request from  [facility], [place] Phone:  [phone]  Fax:  [phone]      Medication - potassium chloride SA (KLOR-CON M) 20 MEQ tablet  Last Refill - 03/30/2022  Last OV - 12/18/2021  Last CPE - 06/29/2022  Next Appointment - 07/02/2022   10:25 pm:   potassium supplement refilled x 6 months electronically this evening. MJB, MD

## 2022-04-29 ENCOUNTER — Encounter: Payer: Self-pay | Admitting: Hematology & Oncology

## 2022-04-29 DIAGNOSIS — M5412 Radiculopathy, cervical region: Secondary | ICD-10-CM | POA: Diagnosis not present

## 2022-04-29 DIAGNOSIS — M5451 Vertebrogenic low back pain: Secondary | ICD-10-CM | POA: Diagnosis not present

## 2022-04-29 DIAGNOSIS — M5416 Radiculopathy, lumbar region: Secondary | ICD-10-CM | POA: Diagnosis not present

## 2022-04-30 ENCOUNTER — Encounter: Payer: Self-pay | Admitting: Internal Medicine

## 2022-04-30 ENCOUNTER — Ambulatory Visit (INDEPENDENT_AMBULATORY_CARE_PROVIDER_SITE_OTHER): Payer: PPO | Admitting: Internal Medicine

## 2022-04-30 VITALS — BP 120/70 | HR 71 | Temp 98.2°F | Ht 59.0 in | Wt 100.8 lb

## 2022-04-30 DIAGNOSIS — N949 Unspecified condition associated with female genital organs and menstrual cycle: Secondary | ICD-10-CM | POA: Diagnosis not present

## 2022-04-30 DIAGNOSIS — N760 Acute vaginitis: Secondary | ICD-10-CM | POA: Diagnosis not present

## 2022-04-30 LAB — POCT URINALYSIS DIPSTICK
Bilirubin, UA: NEGATIVE
Glucose, UA: NEGATIVE
Ketones, UA: NEGATIVE
Nitrite, UA: POSITIVE
Protein, UA: POSITIVE — AB
Spec Grav, UA: >=1.03 — AB (ref 1.010–1.025)
Urobilinogen, UA: 0.2 E.U./dL
pH, UA: 5 (ref 5.0–8.0)

## 2022-04-30 MED ORDER — CIPROFLOXACIN HCL 250 MG PO TABS: 250.0000 mg | ORAL_TABLET | Freq: Two times a day (BID) | ORAL | 0 refills | Status: DC

## 2022-04-30 MED ORDER — MIRABEGRON ER 25 MG PO TB24: 25.0000 mg | ORAL_TABLET | Freq: Every day | ORAL | 3 refills | Status: DC

## 2022-04-30 NOTE — Progress Notes (Signed)
   Subjective:    Patient ID: Debra Ruiz, female    DOB: [birth year], 76 y.o.   MRN: [id number]  HPI 76 year old Female seen today with complaint of vaginal burning with rather sudden onset.  She has no fever or shaking chills.  No nausea or vomiting.  Clean-catch urine showed specific gravity greater than 1.030 on October 10.  Urine culture Grew E. coli greater than 100,000 CFL/mL.  She has a history of multiple myeloma and is followed closely by Dr. Marin Olp.  She is done extremely well under his care.  She has a history of hyperlipidemia treated with Lipitor.  She has a history of hypertension treated with Norvasc and Bystolic.  She takes Lexapro for depression as well as Wellbutrin.  She is having some issues with urge urinary incontinence and has been prescribed Myrbetriq.  She takes Xanax for anxiety.  She takes Ambien for sleep.     Review of Systems has chronic back pain with history of multiple myeloma     Objective:   Physical Exam  Seen in the office today in no acute distress.  She is afebrile.  Temperature 98.2 degrees blood pressure 120/70, pulse 71 and and regular, weight is as 100 pounds 12.8 ounces, BMI 20.36 and height is 4 feet 11 inches  No CVA tenderness.  Urine dipstick is abnormal and culture was sent.    Assessment & Plan:   Symptoms are consistent with UTI and dipstick UA is abnormal.  A culture was sent.  Patient will be placed on Cipro 250 mg twice daily for 7 days.  She will call if not improving within 24 hours or sooner if worse.  Myrbetriq prescribed for urge urinary incontinence which started even before the UTI symptoms did.  Addendum: Culture grew E. coli greater than 100,000 CFU per mL.  Organism is sensitive to Cipro and she will need follow-up here in approximately 10 days.

## 2022-05-01 ENCOUNTER — Telehealth: Payer: Self-pay | Admitting: Internal Medicine

## 2022-05-01 ENCOUNTER — Encounter: Payer: Self-pay | Admitting: Hematology & Oncology

## 2022-05-01 ENCOUNTER — Inpatient Hospital Stay: Payer: PPO | Attending: Hematology & Oncology

## 2022-05-01 ENCOUNTER — Inpatient Hospital Stay (HOSPITAL_BASED_OUTPATIENT_CLINIC_OR_DEPARTMENT_OTHER): Payer: PPO | Admitting: Hematology & Oncology

## 2022-05-01 ENCOUNTER — Inpatient Hospital Stay: Payer: PPO

## 2022-05-01 ENCOUNTER — Telehealth: Payer: Self-pay | Admitting: *Deleted

## 2022-05-01 ENCOUNTER — Other Ambulatory Visit: Payer: Self-pay

## 2022-05-01 VITALS — BP 144/56 | HR 77 | Temp 98.2°F | Resp 18 | Ht 59.0 in | Wt 101.0 lb

## 2022-05-01 DIAGNOSIS — C9000 Multiple myeloma not having achieved remission: Secondary | ICD-10-CM | POA: Insufficient documentation

## 2022-05-01 DIAGNOSIS — D509 Iron deficiency anemia, unspecified: Secondary | ICD-10-CM | POA: Insufficient documentation

## 2022-05-01 LAB — CMP (CANCER CENTER ONLY)
ALT: 12 U/L (ref 0–44)
AST: 12 U/L — ABNORMAL LOW (ref 15–41)
Albumin: 4.2 g/dL (ref 3.5–5.0)
Alkaline Phosphatase: 60 U/L (ref 38–126)
Anion gap: 10 (ref 5–15)
BUN: 21 mg/dL (ref 8–23)
CO2: 24 mmol/L (ref 22–32)
Calcium: 9.6 mg/dL (ref 8.9–10.3)
Chloride: 106 mmol/L (ref 98–111)
Creatinine: 0.79 mg/dL (ref 0.44–1.00)
GFR, Estimated: >60 mL/min (ref >60)
Glucose, Bld: 127 mg/dL — ABNORMAL HIGH (ref 70–99)
Potassium: 4.1 mmol/L (ref 3.5–5.1)
Sodium: 140 mmol/L (ref 135–145)
Total Bilirubin: 0.3 mg/dL (ref 0.3–1.2)
Total Protein: 6.6 g/dL (ref 6.5–8.1)

## 2022-05-01 LAB — CBC WITH DIFFERENTIAL (CANCER CENTER ONLY)
Abs Immature Granulocytes: 0.02 10*3/uL (ref 0.00–0.07)
Basophils Absolute: 0 10*3/uL (ref 0.0–0.1)
Basophils Relative: 0 %
Eosinophils Absolute: 0.1 10*3/uL (ref 0.0–0.5)
Eosinophils Relative: 2 %
HCT: 40.4 % (ref 36.0–46.0)
Hemoglobin: 13.4 g/dL (ref 12.0–15.0)
Immature Granulocytes: 0 %
Lymphocytes Relative: 20 %
Lymphs Abs: 1.3 10*3/uL (ref 0.7–4.0)
MCH: 33 pg (ref 26.0–34.0)
MCHC: 33.2 g/dL (ref 30.0–36.0)
MCV: 99.5 fL (ref 80.0–100.0)
Monocytes Absolute: 0.6 10*3/uL (ref 0.1–1.0)
Monocytes Relative: 10 %
Neutro Abs: 4.6 10*3/uL (ref 1.7–7.7)
Neutrophils Relative %: 68 %
Platelet Count: 247 10*3/uL (ref 150–400)
RBC: 4.06 MIL/uL (ref 3.87–5.11)
RDW: 13.2 % (ref 11.5–15.5)
WBC Count: 6.6 10*3/uL (ref 4.0–10.5)
nRBC: 0 % (ref 0.0–0.2)

## 2022-05-01 LAB — LACTATE DEHYDROGENASE: LDH: 176 U/L (ref 98–192)

## 2022-05-01 MED ORDER — NEBIVOLOL HCL 5 MG PO TABS: ORAL_TABLET | ORAL | 3 refills | Status: DC

## 2022-05-01 NOTE — Telephone Encounter (Signed)
Per 05/01/22 los - gave upcoming appointments - confirmed

## 2022-05-01 NOTE — Progress Notes (Signed)
Hematology and Oncology Follow Up Visit  Debra Ruiz [id number] 02/18/1946 76 y.o. 05/01/2022   Principle Diagnosis:  IgG Kappa myeloma - compression fractures - 13q-, +11, 14q+ - hyperdiploid -- progressive Iron def anemia  Completed Therapy: S/p Kyphoplasty of T11 and T12 XRT to lower spine-completed on 09/25/2016  Current Therapy:    Faspro -- start on 04/05/2020 -- s/p cycle #25 -- Kyprolis added on 07/20/2020 -- s/p cycle #21 Ninlaro 2.3 mg po q week (3 on/1 off) -- start on 01/24/2020 -- d/c on 03/29/2020 due to toxicity Velcade/Pomalyst/Decadron -Start on 09/02/2018 --Pomalyst on hold since 11/09/2018.  D/C on 01/17/2020 Xgeva 197m Sq q  3 month - next dose in 05/2022  IV Injectafer -- last dose given on 11/2018   Interim History:  Ms. JCrossnois here today for follow-up.  She is not feeling well at all.  She has a urinary tract infection.  She saw her family doctor.  She is on 2 different antibiotics which she says..  She also has a decayed tooth in her mouth.  She needs to have this taken out.  Her back is still bothering her.  She is going to go have an injection.  She had a "stroke" in her left eye.  This seems to be improving.  She is on baby aspirin every other day.  As far as the myeloma is concerned, this has been holding pretty steady.  Last monoclonal spike was 0.5 g/dL.  The Kappa light chain actually was down to 3.4 mg/dL.  We will hold off on her treatment today.  I just think too much is going on with her, and treating her, would confuse things.  Her heart comes in with her.  I am glad he is doing little bit better.  Overall, her performance status for now is ECOG 2.  She is still incredibly affected by her back.  This is really the"bane of her existence."  I will have to talk to her spinal surgeon again.  She was sent to a pain clinic which was totally useless to her.  She just wants to be able to walk better again.   Medications:  Allergies as of  05/01/2022       Reactions   Crestor [rosuvastatin Calcium] Other (See Comments)   Causes liver functions to be elevated        Medication List        Accurate as of May 01, 2022  9:31 AM. If you have any questions, ask your nurse or doctor.          ALPRAZolam 0.5 MG tablet Commonly known as: XANAX TAKE 1 TABLET(0.5 MG) BY MOUTH TWICE DAILY AS NEEDED FOR ANXIETY   amLODipine 5 MG tablet Commonly known as: NORVASC TAKE 1 TABLET(5 MG) BY MOUTH DAILY   aspirin EC 81 MG tablet Take 81 mg by mouth every other day.   atorvastatin 20 MG tablet Commonly known as: LIPITOR TAKE 1 TABLET(20 MG) BY MOUTH DAILY   buPROPion 300 MG 24 hr tablet Commonly known as: WELLBUTRIN XL TAKE 1 TABLET(300 MG) BY MOUTH DAILY   ciprofloxacin 250 MG tablet Commonly known as: Cipro Take 1 tablet (250 mg total) by mouth 2 (two) times daily.   dexamethasone 4 MG tablet Commonly known as: DECADRON TAKE 3 TABLETS BY MOUTH 1 TIME A WEEK   escitalopram 20 MG tablet Commonly known as: LEXAPRO TAKE 1 TABLET(20 MG) BY MOUTH DAILY   famciclovir 250 MG tablet Commonly known  as: FAMVIR TAKE 1 TABLET(250 MG) BY MOUTH DAILY   Flarex 0.1 % ophthalmic suspension Generic drug: fluorometholone   hydrOXYzine 25 MG capsule Commonly known as: VISTARIL Take 1 capsule (25 mg total) by mouth every 8 (eight) hours as needed.   mirabegron ER 25 MG Tb24 tablet Commonly known as: Myrbetriq Take 1 tablet (25 mg total) by mouth daily.   nebivolol 5 MG tablet Commonly known as: Bystolic TAKE 1 TABLET(5 MG) BY MOUTH DAILY   Oxervate 0.002 % Soln Generic drug: Cenegermin-bkbj   oxyCODONE-acetaminophen 5-325 MG tablet Commonly known as: PERCOCET/ROXICET Take 1 tablet by mouth every 4 (four) hours as needed.   potassium chloride SA 20 MEQ tablet Commonly known as: KLOR-CON M TAKE 1 TABLET BY MOUTH DAILY   prochlorperazine 10 MG tablet Commonly known as: COMPAZINE TAKE 1 TABLET(10 MG) BY MOUTH  EVERY 6 HOURS AS NEEDED FOR NAUSEA OR VOMITING   REFRESH DRY EYE THERAPY OP Apply to eye 3 (three) times daily.   traMADol 50 MG tablet Commonly known as: ULTRAM Take 1 tablet (50 mg total) by mouth every 6 (six) hours as needed.   triamcinolone 0.1 % cream : eucerin Crea triamcinolone cr. 0.1%/eucerin cr.  APPLY TO SKIN TWICE DAILY FOR ITCHING   Vitamin B-12 5000 MCG Tbdp Take by mouth daily.   Vitamin D 50 MCG (2000 UT) Caps Take 2,000 Units by mouth daily.   zolpidem 10 MG tablet Commonly known as: AMBIEN TAKE 1 TABLET(10 MG) BY MOUTH AT BEDTIME        Allergies:  Allergies  Allergen Reactions   Crestor [Rosuvastatin Calcium] Other (See Comments)    Causes liver functions to be elevated    Past Medical History, Surgical history, Social history, and Family History were reviewed and updated.  Review of Systems: Review of Systems  Constitutional: Negative.   HENT: Negative.    Eyes:  Positive for double vision.  Respiratory: Negative.    Cardiovascular: Negative.   Gastrointestinal: Negative.   Genitourinary: Negative.   Musculoskeletal:  Positive for back pain.  Skin: Negative.   Neurological: Negative.   Endo/Heme/Allergies: Negative.   Psychiatric/Behavioral: Negative.      Physical Exam:  height is _0  (1.499 m) and weight is 101 lb (45.8 kg). Her oral temperature is 98.2 F (36.8 C). Her blood pressure is 144/56 (abnormal) and her pulse is 77. Her respiration is 18 and oxygen saturation is 100%.   Wt Readings from Last 3 Encounters:  05/01/22 101 lb (45.8 kg)  04/30/22 100 lb 12.8 oz (45.7 kg)  04/03/22 102 lb 1.9 oz (46.3 kg)    Physical Exam Vitals reviewed.  HENT:     Head: Normocephalic and atraumatic.  Eyes:     Pupils: Pupils are equal, round, and reactive to light.     Comments: Her ocular exam shows a patch over her right eye.  Cardiovascular:     Rate and Rhythm: Normal rate and regular rhythm.     Heart sounds: Normal heart  sounds.  Pulmonary:     Effort: Pulmonary effort is normal.     Breath sounds: Normal breath sounds.  Abdominal:     General: Bowel sounds are normal.     Palpations: Abdomen is soft.  Musculoskeletal:        General: No tenderness or deformity. Normal range of motion.     Cervical back: Normal range of motion.  Lymphadenopathy:     Cervical: No cervical adenopathy.  Skin:  General: Skin is warm and dry.     Findings: No erythema or rash.  Neurological:     Mental Status: She is alert and oriented to person, place, and time.  Psychiatric:        Behavior: Behavior normal.        Thought Content: Thought content normal.        Judgment: Judgment normal.       Lab Results  Component Value Date   WBC 6.6 05/01/2022   HGB 13.4 05/01/2022   HCT 40.4 05/01/2022   MCV 99.5 05/01/2022   PLT 247 05/01/2022   Lab Results  Component Value Date   FERRITIN 258 10/25/2020   IRON 136 10/25/2020   TIBC 353 10/25/2020   UIBC 216 10/25/2020   IRONPCTSAT 39 10/25/2020   Lab Results  Component Value Date   RBC 4.06 05/01/2022   Lab Results  Component Value Date   KPAFRELGTCHN 34.1 (H) 04/03/2022   LAMBDASER <1.5 (L) 04/03/2022   KAPLAMBRATIO >22.73 (H) 04/03/2022   Lab Results  Component Value Date   IGGSERUM 716 04/03/2022   IGGSERUM 765 04/03/2022   IGA <5 (L) 04/03/2022   IGA <5 (L) 04/03/2022   IGMSERUM <5 (L) 04/03/2022   IGMSERUM <5 (L) 04/03/2022   Lab Results  Component Value Date   TOTALPROTELP 6.4 04/03/2022   ALBUMINELP 3.7 04/03/2022   A1GS 0.3 04/03/2022   A2GS 0.8 04/03/2022   BETS 0.9 04/03/2022   GAMS 0.7 04/03/2022   MSPIKE 0.5 (H) 04/03/2022   SPEI Comment 01/28/2018     Chemistry      Component Value Date/Time   NA 140 05/01/2022 0839   NA 148 (H) 07/16/2017 1128   NA 143 03/05/2017 1051   K 4.1 05/01/2022 0839   K 3.8 07/16/2017 1128   K 3.7 03/05/2017 1051   CL 106 05/01/2022 0839   CL 106 07/16/2017 1128   CO2 24 05/01/2022 0839    CO2 29 07/16/2017 1128   CO2 25 03/05/2017 1051   BUN 21 05/01/2022 0839   BUN 23 (H) 07/16/2017 1128   BUN 18.2 03/05/2017 1051   CREATININE 0.79 05/01/2022 0839   CREATININE 0.75 06/20/2020 1127   CREATININE 0.8 03/05/2017 1051      Component Value Date/Time   CALCIUM 9.6 05/01/2022 0839   CALCIUM 9.7 07/16/2017 1128   CALCIUM 8.9 03/05/2017 1051   ALKPHOS 60 05/01/2022 0839   ALKPHOS 71 07/16/2017 1128   ALKPHOS 67 03/05/2017 1051   AST 12 (L) 05/01/2022 0839   AST 15 03/05/2017 1051   ALT 12 05/01/2022 0839   ALT 23 07/16/2017 1128   ALT 17 03/05/2017 1051   BILITOT 0.3 05/01/2022 0839   BILITOT 0.55 03/05/2017 1051        Impression and Plan: Ms. Mazurek is a very pleasant 76 yo caucasian female with IgG Kappa Myeloma.   Overall, she is managing.  I must say that despite being so petite, she is incredibly tough.  She is shown some which resilience.  I do not see a problem with her holding treatment for height now.  I think she will feel a whole lot better if we did hold treatment  I am sure her myeloma studies may go up a little bit but again, myeloma really has not been her real problem clinically.  It has been her back.  We will plan to have her come back in 1 month.  Hopefully, by then, she will be  feeling much better.   [name], MD 10/11/20239:31 AM

## 2022-05-01 NOTE — Telephone Encounter (Signed)
Received Fax RX request from  [facility] [place] [facility] [place] Phone: [phone]  Fax: [phone]      Medication - nebivolol (BYSTOLIC) 5 MG tablet   Last Refill - 06/17/21  Last OV - 04/30/22  Last CPE - 06/29/21  Next Appointment - 07/02/22

## 2022-05-02 ENCOUNTER — Ambulatory Visit: Payer: PPO

## 2022-05-02 ENCOUNTER — Inpatient Hospital Stay: Payer: PPO

## 2022-05-02 ENCOUNTER — Ambulatory Visit: Payer: PPO | Admitting: Hematology & Oncology

## 2022-05-02 LAB — KAPPA/LAMBDA LIGHT CHAINS
Kappa free light chain: 47.4 mg/L — ABNORMAL HIGH (ref 3.3–19.4)
Kappa, lambda light chain ratio: >31.6 — ABNORMAL HIGH (ref 0.26–1.65)
Lambda free light chains: <1.5 mg/L — ABNORMAL LOW (ref 5.7–26.3)

## 2022-05-03 ENCOUNTER — Ambulatory Visit: Payer: PPO | Admitting: Hematology & Oncology

## 2022-05-03 ENCOUNTER — Other Ambulatory Visit: Payer: PPO

## 2022-05-03 ENCOUNTER — Encounter: Payer: Self-pay | Admitting: Hematology & Oncology

## 2022-05-03 ENCOUNTER — Ambulatory Visit: Payer: PPO

## 2022-05-03 LAB — URINE CULTURE
MICRO NUMBER:: 14031543
SPECIMEN QUALITY:: ADEQUATE

## 2022-05-03 LAB — IGG, IGA, IGM
IgA: 8 mg/dL — ABNORMAL LOW (ref 64–422)
IgG (Immunoglobin G), Serum: 753 mg/dL (ref 586–1602)
IgM (Immunoglobulin M), Srm: 10 mg/dL — ABNORMAL LOW (ref 26–217)

## 2022-05-04 NOTE — Patient Instructions (Addendum)
Patient started on Cipro 250 mg twice daily for 7 days.  Addendum: Culture grew E. coli greater than 100,000 CFU/mL.  Follow-up in 10 days.  Organism is sensitive to Cipro.  Myrbetriq prescribed for urge urinary incontinence which started even before this UTI.

## 2022-05-06 LAB — PROTEIN ELECTROPHORESIS, SERUM
A/G Ratio: 1.4 (ref 0.7–1.7)
Albumin ELP: 3.7 g/dL (ref 2.9–4.4)
Alpha-1-Globulin: 0.3 g/dL (ref 0.0–0.4)
Alpha-2-Globulin: 0.7 g/dL (ref 0.4–1.0)
Beta Globulin: 0.9 g/dL (ref 0.7–1.3)
Gamma Globulin: 0.7 g/dL (ref 0.4–1.8)
Globulin, Total: 2.6 g/dL (ref 2.2–3.9)
M-Spike, %: 0.5 g/dL — ABNORMAL HIGH
Total Protein ELP: 6.3 g/dL (ref 6.0–8.5)

## 2022-05-20 ENCOUNTER — Other Ambulatory Visit: Payer: Self-pay | Admitting: Hematology & Oncology

## 2022-05-21 ENCOUNTER — Encounter: Payer: Self-pay | Admitting: Hematology & Oncology

## 2022-05-23 ENCOUNTER — Telehealth: Payer: Self-pay | Admitting: Internal Medicine

## 2022-05-23 NOTE — Telephone Encounter (Signed)
Debra Ruiz [id number]  Debra Ruiz wants to come in and have urine rechecked for UTI. She finishes her medication on Monday and is scheduled for her chemo on Wednesday and she needs to know UTI is gone prior to Wednesday. When would be best day to come in and recheck so results will be back so she can have chemo on Wednesday if all better?

## 2022-05-23 NOTE — Telephone Encounter (Signed)
scheduled

## 2022-05-24 ENCOUNTER — Ambulatory Visit (INDEPENDENT_AMBULATORY_CARE_PROVIDER_SITE_OTHER): Payer: PPO

## 2022-05-24 VITALS — BP 108/60 | HR 62 | Temp 98.8°F | Ht 59.0 in | Wt 100.4 lb

## 2022-05-24 DIAGNOSIS — R3989 Other symptoms and signs involving the genitourinary system: Secondary | ICD-10-CM

## 2022-05-24 DIAGNOSIS — N949 Unspecified condition associated with female genital organs and menstrual cycle: Secondary | ICD-10-CM | POA: Diagnosis not present

## 2022-05-24 DIAGNOSIS — N9489 Other specified conditions associated with female genital organs and menstrual cycle: Secondary | ICD-10-CM

## 2022-05-24 LAB — POCT URINALYSIS DIPSTICK
Bilirubin, UA: NEGATIVE
Blood, UA: NEGATIVE
Glucose, UA: NEGATIVE
Ketones, UA: NEGATIVE
Leukocytes, UA: NEGATIVE
Nitrite, UA: NEGATIVE
Protein, UA: NEGATIVE
Spec Grav, UA: 1.02 (ref 1.010–1.025)
Urobilinogen, UA: 0.2 E.U./dL
pH, UA: 5 (ref 5.0–8.0)

## 2022-05-24 NOTE — Addendum Note (Signed)
Addended by: Elby Showers on: 05/24/2022 12:07 PM   Modules accepted: Level of Service

## 2022-05-24 NOTE — Progress Notes (Signed)
Urine check for follow up E.coli UTI on October 10 treated with Cipro 250 mg twice daily for 7 days. Patient has completed treatment. Wanted urine checked before upcoming chemotherapy. Nurse visit today: urine dipstick is completely normal.

## 2022-05-29 ENCOUNTER — Inpatient Hospital Stay: Payer: PPO | Admitting: Licensed Clinical Social Worker

## 2022-05-29 ENCOUNTER — Inpatient Hospital Stay: Payer: PPO

## 2022-05-29 ENCOUNTER — Inpatient Hospital Stay (HOSPITAL_BASED_OUTPATIENT_CLINIC_OR_DEPARTMENT_OTHER): Payer: PPO | Admitting: Hematology & Oncology

## 2022-05-29 ENCOUNTER — Encounter: Payer: Self-pay | Admitting: Hematology & Oncology

## 2022-05-29 ENCOUNTER — Inpatient Hospital Stay: Payer: PPO | Attending: Hematology & Oncology

## 2022-05-29 ENCOUNTER — Other Ambulatory Visit: Payer: Self-pay

## 2022-05-29 VITALS — BP 144/58 | HR 75 | Temp 97.7°F | Resp 20 | Ht 59.0 in | Wt 100.0 lb

## 2022-05-29 DIAGNOSIS — C9000 Multiple myeloma not having achieved remission: Secondary | ICD-10-CM | POA: Insufficient documentation

## 2022-05-29 DIAGNOSIS — Z79899 Other long term (current) drug therapy: Secondary | ICD-10-CM | POA: Insufficient documentation

## 2022-05-29 DIAGNOSIS — Z5112 Encounter for antineoplastic immunotherapy: Secondary | ICD-10-CM | POA: Diagnosis not present

## 2022-05-29 LAB — CBC WITH DIFFERENTIAL (CANCER CENTER ONLY)
Abs Immature Granulocytes: 0.02 10*3/uL (ref 0.00–0.07)
Basophils Absolute: 0 10*3/uL (ref 0.0–0.1)
Basophils Relative: 0 %
Eosinophils Absolute: 0 10*3/uL (ref 0.0–0.5)
Eosinophils Relative: 1 %
HCT: 40.6 % (ref 36.0–46.0)
Hemoglobin: 13.3 g/dL (ref 12.0–15.0)
Immature Granulocytes: 0 %
Lymphocytes Relative: 22 %
Lymphs Abs: 1.3 10*3/uL (ref 0.7–4.0)
MCH: 32.7 pg (ref 26.0–34.0)
MCHC: 32.8 g/dL (ref 30.0–36.0)
MCV: 99.8 fL (ref 80.0–100.0)
Monocytes Absolute: 0.4 10*3/uL (ref 0.1–1.0)
Monocytes Relative: 8 %
Neutro Abs: 4 10*3/uL (ref 1.7–7.7)
Neutrophils Relative %: 69 %
Platelet Count: 222 10*3/uL (ref 150–400)
RBC: 4.07 MIL/uL (ref 3.87–5.11)
RDW: 13.1 % (ref 11.5–15.5)
WBC Count: 5.7 10*3/uL (ref 4.0–10.5)
nRBC: 0 % (ref 0.0–0.2)

## 2022-05-29 LAB — CMP (CANCER CENTER ONLY)
ALT: 17 U/L (ref 0–44)
AST: 18 U/L (ref 15–41)
Albumin: 4.3 g/dL (ref 3.5–5.0)
Alkaline Phosphatase: 57 U/L (ref 38–126)
Anion gap: 10 (ref 5–15)
BUN: 24 mg/dL — ABNORMAL HIGH (ref 8–23)
CO2: 26 mmol/L (ref 22–32)
Calcium: 9.4 mg/dL (ref 8.9–10.3)
Chloride: 104 mmol/L (ref 98–111)
Creatinine: 0.76 mg/dL (ref 0.44–1.00)
GFR, Estimated: >60 mL/min (ref >60)
Glucose, Bld: 165 mg/dL — ABNORMAL HIGH (ref 70–99)
Potassium: 3.4 mmol/L — ABNORMAL LOW (ref 3.5–5.1)
Sodium: 140 mmol/L (ref 135–145)
Total Bilirubin: 0.4 mg/dL (ref 0.3–1.2)
Total Protein: 6.9 g/dL (ref 6.5–8.1)

## 2022-05-29 LAB — LACTATE DEHYDROGENASE: LDH: 181 U/L (ref 98–192)

## 2022-05-29 MED ORDER — SODIUM CHLORIDE 0.9 % IV SOLN: Freq: Once | INTRAVENOUS | Status: AC

## 2022-05-29 MED ORDER — ACETAMINOPHEN 325 MG PO TABS: 650.0000 mg | ORAL_TABLET | Freq: Once | ORAL | Status: DC

## 2022-05-29 MED ORDER — DIPHENHYDRAMINE HCL 25 MG PO CAPS: 25.0000 mg | ORAL_CAPSULE | Freq: Once | ORAL | Status: DC

## 2022-05-29 MED ORDER — DARATUMUMAB-HYALURONIDASE-FIHJ 1800-30000 MG-UT/15ML ~~LOC~~ SOLN
1800.0000 mg | Freq: Once | SUBCUTANEOUS | Status: AC
  Administered 2022-05-29: 1800 mg via SUBCUTANEOUS
  Filled 2022-05-29: qty 15

## 2022-05-29 MED ORDER — PROCHLORPERAZINE MALEATE 10 MG PO TABS
10.0000 mg | ORAL_TABLET | Freq: Once | ORAL | Status: AC
  Administered 2022-05-29: 10 mg via ORAL
  Filled 2022-05-29: qty 1

## 2022-05-29 MED ORDER — SODIUM CHLORIDE 0.9 % IV SOLN
20.0000 mg | Freq: Once | INTRAVENOUS | Status: AC
  Administered 2022-05-29: 20 mg via INTRAVENOUS
  Filled 2022-05-29: qty 20

## 2022-05-29 MED ORDER — DEXTROSE 5 % IV SOLN
70.0000 mg/m2 | Freq: Once | INTRAVENOUS | Status: AC
  Administered 2022-05-29: 100 mg via INTRAVENOUS
  Filled 2022-05-29: qty 30

## 2022-05-29 NOTE — Progress Notes (Signed)
Hematology and Oncology Follow Up Visit  Debra Ruiz [id number] [birth year] 76 y.o. 05/29/2022   Principle Diagnosis:  IgG Kappa myeloma - compression fractures - 13q-, +11, 14q+ - hyperdiploid -- progressive Iron def anemia  Completed Therapy: S/p Kyphoplasty of T11 and T12 XRT to lower spine-completed on 09/25/2016  Current Therapy:    Faspro -- start on 04/05/2020 -- s/p cycle #27 -- Kyprolis added on 07/20/2020 -- s/p cycle #23 Ninlaro 2.3 mg po q week (3 on/1 off) -- start on 01/24/2020 -- d/c on 03/29/2020 due to toxicity Velcade/Pomalyst/Decadron -Start on 09/02/2018 --Pomalyst on hold since 11/09/2018.  D/C on 01/17/2020 Xgeva 169m Sq q  3 month - next dose in 08/2022  IV Injectafer -- last dose given on 11/2018   Interim History:  Debra Ruiz here today for follow-up.  She is under a lot of stress still.  The biggest problem is her poor husband has Alzheimer's.  He still is in good shape physically.  It is hard to say how much longer she will be able to help him.  She does have her health issues herself.  She has a lot of arthritic issues.  She does see pain specialist for this.  She will bladder neck and back issues.  I think she gets injections.  As far as her myeloma goes, she is holding pretty steady.  When we last saw her, her Kappa light chain was up a little bit.  Is 4.7 mg/dL.  Last monoclonal spike was 0.5 mg/dL.  She is doing better with the left eye.  She had a "stroke" with the eye.  She has been seen ophthalmology for this.  She is on baby aspirin.  She has had no change in bowel or bladder habits.  She has had no rashes.  There is been no cough or shortness of breath.  She has had no leg swelling.  She has had no bleeding.  Overall, I would have said that her performance status is probably ECOG 1.    Medications:  Allergies as of 05/29/2022       Reactions   Crestor [rosuvastatin Calcium] Other (See Comments)   Causes liver functions to be elevated         Medication List        Accurate as of May 29, 2022  9:08 AM. If you have any questions, ask your nurse or doctor.          ALPRAZolam 0.5 MG tablet Commonly known as: XANAX TAKE 1 TABLET(0.5 MG) BY MOUTH TWICE DAILY AS NEEDED FOR ANXIETY   amLODipine 5 MG tablet Commonly known as: NORVASC TAKE 1 TABLET(5 MG) BY MOUTH DAILY   aspirin EC 81 MG tablet Take 81 mg by mouth every other day.   atorvastatin 20 MG tablet Commonly known as: LIPITOR TAKE 1 TABLET(20 MG) BY MOUTH DAILY   buPROPion 300 MG 24 hr tablet Commonly known as: WELLBUTRIN XL TAKE 1 TABLET(300 MG) BY MOUTH DAILY   ciprofloxacin 250 MG tablet Commonly known as: Cipro Take 1 tablet (250 mg total) by mouth 2 (two) times daily.   dexamethasone 4 MG tablet Commonly known as: DECADRON TAKE 3 TABLETS BY MOUTH 1 TIME A WEEK   escitalopram 20 MG tablet Commonly known as: LEXAPRO TAKE 1 TABLET(20 MG) BY MOUTH DAILY   famciclovir 250 MG tablet Commonly known as: FAMVIR TAKE 1 TABLET(250 MG) BY MOUTH DAILY   Flarex 0.1 % ophthalmic suspension Generic drug: fluorometholone   hydrOXYzine  25 MG capsule Commonly known as: VISTARIL Take 1 capsule (25 mg total) by mouth every 8 (eight) hours as needed.   mirabegron ER 25 MG Tb24 tablet Commonly known as: Myrbetriq Take 1 tablet (25 mg total) by mouth daily.   naloxone 4 MG/0.1ML Liqd nasal spray kit Commonly known as: NARCAN CALL 911. SPR CONTENTS OF ONE SPRAYER (0.1ML) INTO ONE NOSTRIL. REPEAT IN 2-3 MIN IF SYMPTOMS OF OPIOID EMERGENCY PERSIST, ALTERNATE NOSTRILS   nebivolol 5 MG tablet Commonly known as: Bystolic TAKE 1 TABLET(5 MG) BY MOUTH DAILY   Oxervate 0.002 % Soln Generic drug: Cenegermin-bkbj   oxyCODONE-acetaminophen 5-325 MG tablet Commonly known as: PERCOCET/ROXICET Take 1 tablet by mouth every 4 (four) hours as needed.   potassium chloride SA 20 MEQ tablet Commonly known as: KLOR-CON M TAKE 1 TABLET BY MOUTH DAILY    prochlorperazine 10 MG tablet Commonly known as: COMPAZINE TAKE 1 TABLET(10 MG) BY MOUTH EVERY 6 HOURS AS NEEDED FOR NAUSEA OR VOMITING   REFRESH DRY EYE THERAPY OP Apply to eye 3 (three) times daily.   traMADol 50 MG tablet Commonly known as: ULTRAM Take 1 tablet (50 mg total) by mouth every 6 (six) hours as needed.   triamcinolone 0.1 % cream : eucerin Crea triamcinolone cr. 0.1%/eucerin cr.  APPLY TO SKIN TWICE DAILY FOR ITCHING   Vitamin B-12 5000 MCG Tbdp Take by mouth daily.   Vitamin D 50 MCG (2000 UT) Caps Take 2,000 Units by mouth daily.   zolpidem 10 MG tablet Commonly known as: AMBIEN TAKE 1 TABLET(10 MG) BY MOUTH AT BEDTIME        Allergies:  Allergies  Allergen Reactions   Crestor [Rosuvastatin Calcium] Other (See Comments)    Causes liver functions to be elevated    Past Medical History, Surgical history, Social history, and Family History were reviewed and updated.  Review of Systems: Review of Systems  Constitutional: Negative.   HENT: Negative.    Eyes:  Positive for double vision.  Respiratory: Negative.    Cardiovascular: Negative.   Gastrointestinal: Negative.   Genitourinary: Negative.   Musculoskeletal:  Positive for back pain.  Skin: Negative.   Neurological: Negative.   Endo/Heme/Allergies: Negative.   Psychiatric/Behavioral: Negative.      Physical Exam:  height is _0  (1.499 m) and weight is 100 lb (45.4 kg). Her oral temperature is 97.7 F (36.5 C). Her blood pressure is 144/58 (abnormal) and her pulse is 75. Her respiration is 20 and oxygen saturation is 97%.   Wt Readings from Last 3 Encounters:  05/29/22 100 lb (45.4 kg)  05/24/22 100 lb 6.4 oz (45.5 kg)  05/01/22 101 lb (45.8 kg)    Physical Exam Vitals reviewed.  HENT:     Head: Normocephalic and atraumatic.  Eyes:     Pupils: Pupils are equal, round, and reactive to light.     Comments: Her ocular exam shows a patch over her right eye.  Cardiovascular:      Rate and Rhythm: Normal rate and regular rhythm.     Heart sounds: Normal heart sounds.  Pulmonary:     Effort: Pulmonary effort is normal.     Breath sounds: Normal breath sounds.  Abdominal:     General: Bowel sounds are normal.     Palpations: Abdomen is soft.  Musculoskeletal:        General: No tenderness or deformity. Normal range of motion.     Cervical back: Normal range of motion.  Lymphadenopathy:  Cervical: No cervical adenopathy.  Skin:    General: Skin is warm and dry.     Findings: No erythema or rash.  Neurological:     Mental Status: She is alert and oriented to person, place, and time.  Psychiatric:        Behavior: Behavior normal.        Thought Content: Thought content normal.        Judgment: Judgment normal.      Lab Results  Component Value Date   WBC 5.7 05/29/2022   HGB 13.3 05/29/2022   HCT 40.6 05/29/2022   MCV 99.8 05/29/2022   PLT 222 05/29/2022   Lab Results  Component Value Date   FERRITIN 258 10/25/2020   IRON 136 10/25/2020   TIBC 353 10/25/2020   UIBC 216 10/25/2020   IRONPCTSAT 39 10/25/2020   Lab Results  Component Value Date   RBC 4.07 05/29/2022   Lab Results  Component Value Date   KPAFRELGTCHN 47.4 (H) 05/01/2022   LAMBDASER <1.5 (L) 05/01/2022   KAPLAMBRATIO >31.60 (H) 05/01/2022   Lab Results  Component Value Date   IGGSERUM 753 05/01/2022   IGA 8 (L) 05/01/2022   IGMSERUM 10 (L) 05/01/2022   Lab Results  Component Value Date   TOTALPROTELP 6.3 05/01/2022   ALBUMINELP 3.7 05/01/2022   A1GS 0.3 05/01/2022   A2GS 0.7 05/01/2022   BETS 0.9 05/01/2022   GAMS 0.7 05/01/2022   MSPIKE 0.5 (H) 05/01/2022   SPEI Comment 05/01/2022     Chemistry      Component Value Date/Time   NA 140 05/29/2022 0819   NA 148 (H) 07/16/2017 1128   NA 143 03/05/2017 1051   K 3.4 (L) 05/29/2022 0819   K 3.8 07/16/2017 1128   K 3.7 03/05/2017 1051   CL 104 05/29/2022 0819   CL 106 07/16/2017 1128   CO2 26 05/29/2022 0819    CO2 29 07/16/2017 1128   CO2 25 03/05/2017 1051   BUN 24 (H) 05/29/2022 0819   BUN 23 (H) 07/16/2017 1128   BUN 18.2 03/05/2017 1051   CREATININE 0.76 05/29/2022 0819   CREATININE 0.75 06/20/2020 1127   CREATININE 0.8 03/05/2017 1051      Component Value Date/Time   CALCIUM 9.4 05/29/2022 0819   CALCIUM 9.7 07/16/2017 1128   CALCIUM 8.9 03/05/2017 1051   ALKPHOS 57 05/29/2022 0819   ALKPHOS 71 07/16/2017 1128   ALKPHOS 67 03/05/2017 1051   AST 18 05/29/2022 0819   AST 15 03/05/2017 1051   ALT 17 05/29/2022 0819   ALT 23 07/16/2017 1128   ALT 17 03/05/2017 1051   BILITOT 0.4 05/29/2022 0819   BILITOT 0.55 03/05/2017 1051        Impression and Plan: Debra Ruiz is a very pleasant 76 yo caucasian female with IgG Kappa Myeloma.   I just hate that she is having issues with respect to her husband.  I know that she is trying her best to help him.  Again we will be interesting to see what her myeloma numbers look like.  Hopefully, she will have stability or may be an improvement.  It really is her Kappa light chain that we follow.  For right now, we will plan to get her back in another month.  Hopefully before we see her back, she will be getting the injections that will hopefully help her back pain.     [name], MD 11/8/20239:08 AM

## 2022-05-29 NOTE — Patient Instructions (Signed)
[facility] [place]  Discharge Instructions: Thank you for choosing [facility] to provide your oncology and hematology care.   If you have a lab appointment with the [facility], please go directly to the [facility] and check in at the registration area.  Wear comfortable clothing and clothing appropriate for easy access to any Portacath or PICC line.   We strive to give you quality time with your provider. You may need to reschedule your appointment if you arrive late (15 or more minutes).  Arriving late affects you and other patients whose appointments are after yours.  Also, if you miss three or more appointments without notifying the office, you may be dismissed from the clinic at the provider's discretion.      For prescription refill requests, have your pharmacy contact our office and allow 72 hours for refills to be completed.    Today you received the following chemotherapy and/or immunotherapy agents kyprolis, faspro    To help prevent nausea and vomiting after your treatment, we encourage you to take your nausea medication as directed.  BELOW ARE SYMPTOMS THAT SHOULD BE REPORTED IMMEDIATELY: *FEVER GREATER THAN 100.4 F (38 C) OR HIGHER *CHILLS OR SWEATING *NAUSEA AND VOMITING THAT IS NOT CONTROLLED WITH YOUR NAUSEA MEDICATION *UNUSUAL SHORTNESS OF BREATH *UNUSUAL BRUISING OR BLEEDING *URINARY PROBLEMS (pain or burning when urinating, or frequent urination) *BOWEL PROBLEMS (unusual diarrhea, constipation, pain near the anus) TENDERNESS IN MOUTH AND THROAT WITH OR WITHOUT PRESENCE OF ULCERS (sore throat, sores in mouth, or a toothache) UNUSUAL RASH, SWELLING OR PAIN  UNUSUAL VAGINAL DISCHARGE OR ITCHING   Items with * indicate a potential emergency and should be followed up as soon as possible or go to the Emergency Department if any problems should occur.  Please show the CHEMOTHERAPY ALERT CARD or IMMUNOTHERAPY ALERT CARD at check-in to  the Emergency Department and triage nurse. Should you have questions after your visit or need to cancel or reschedule your appointment, please contact [facility]  [phone] and follow the prompts.  Office hours are 8:00 a.m. to 4:30 p.m. Monday - Friday. Please note that voicemails left after 4:00 p.m. may not be returned until the following business day.  We are closed weekends and major holidays. You have access to a nurse at all times for urgent questions. Please call the main number to the clinic [phone] and follow the prompts.  For any non-urgent questions, you may also contact your provider using MyChart. We now offer e-Visits for anyone 28 and older to request care online for non-urgent symptoms. For details visit [email or web address].   Also download the MyChart app! Go to the app store, search "MyChart", open the app, select [facility], and log in with your MyChart username and password.  Masks are optional in the cancer centers. If you would like for your care team to wear a mask while they are taking care of you, please let them know. You may have one support person who is at least 76 years old accompany you for your appointments.

## 2022-05-29 NOTE — Progress Notes (Signed)
[facility]  Initial Assessment   HELEN WINTERHALTER is a 76 y.o. year old female presenting alone. Clinical Social Work was referred by nurse for assessment of psychosocial needs.   SDOH (Social Determinants of Health) assessments performed: Yes SDOH Interventions    Flowsheet Row Clinical Support from 05/29/2022 in [facility] from 06/29/2021 in [facility], MD  SDOH Interventions    Food Insecurity Interventions -- Intervention Not Indicated  Housing Interventions -- Intervention Not Indicated  Transportation Interventions -- Intervention Not Indicated  Utilities Interventions Intervention Not Indicated --  Financial Strain Interventions -- Intervention Not Indicated  Physical Activity Interventions -- Intervention Not Indicated, Other (Comments)  [back issues]  Stress Interventions -- Intervention Not Indicated  Social Connections Interventions -- Intervention Not Indicated       SDOH Screenings   Food Insecurity: No Food Insecurity (06/29/2021)  Housing: Low Risk  (06/29/2021)  Transportation Needs: No Transportation Needs (06/29/2021)  Utilities: Not At Risk (05/29/2022)  Alcohol Screen: Low Risk  (06/29/2021)  Depression (PHQ2-9): Low Risk  (06/29/2021)  Financial Resource Strain: Low Risk  (06/29/2021)  Physical Activity: Inactive (06/29/2021)  Social Connections: Moderately Isolated (06/29/2021)  Stress: Stress Concern Present (06/29/2021)  Tobacco Use: Medium Risk (05/29/2022)     Distress Screen completed: No     No data to display            Family/Social Information:  Housing Arrangement: patient lives with her husband. Family members/support persons in your life? Family Transportation concerns: no  Employment: Retired  Income source: Paediatric nurse concerns: No Type of concern: None Food access concerns: no Religious or spiritual practice: Not identified Services Currently in  place:  Medicare  Coping/ Adjustment to diagnosis: Patient understands treatment plan and what happens next? yes Concerns about diagnosis and/or treatment: How I will care for other members of my family Patient reported stressors: Partner.  Her husband has cognitive deficits, which will progress.  She wants to make sure he is cared for should her cancer get worse.  She has family in [place] who could carry out her wishes for him. Hopes and/or priorities: For husband to be taken care of. Current coping skills/ strengths: Capable of independent living , Communication skills , Scientist, research (life sciences) , General fund of knowledge , and Motivation for treatment/growth     SUMMARY: Current SDOH Barriers:  Limited social support  Clinical Social Work Clinical Goal(s):  Explore community options related to caring for patient's husband.  Interventions: Discussed common feeling and emotions when being diagnosed with cancer, and the importance of support during treatment Informed patient of the support team roles and support services at [facility] Provided CSW contact information and encouraged patient to call with any questions or concerns Provided patient with information about [employer] [facility] and [employer], as well as, options available for husband.   Follow Up Plan: Patient will contact CSW with any support or resource needs Patient verbalizes understanding of plan: Yes    Rodman Pickle Deshone Lyssy, LCSW

## 2022-05-30 LAB — KAPPA/LAMBDA LIGHT CHAINS
Kappa free light chain: 64.8 mg/L — ABNORMAL HIGH (ref 3.3–19.4)
Kappa, lambda light chain ratio: >43.2 — ABNORMAL HIGH (ref 0.26–1.65)
Lambda free light chains: <1.5 mg/L — ABNORMAL LOW (ref 5.7–26.3)

## 2022-05-31 LAB — IGG, IGA, IGM
IgA: 6 mg/dL — ABNORMAL LOW (ref 64–422)
IgG (Immunoglobin G), Serum: 964 mg/dL (ref 586–1602)
IgM (Immunoglobulin M), Srm: 10 mg/dL — ABNORMAL LOW (ref 26–217)

## 2022-06-03 DIAGNOSIS — M5412 Radiculopathy, cervical region: Secondary | ICD-10-CM | POA: Diagnosis not present

## 2022-06-04 DIAGNOSIS — H04129 Dry eye syndrome of unspecified lacrimal gland: Secondary | ICD-10-CM | POA: Diagnosis not present

## 2022-06-04 DIAGNOSIS — H532 Diplopia: Secondary | ICD-10-CM | POA: Diagnosis not present

## 2022-06-04 DIAGNOSIS — H4921 Sixth [abducent] nerve palsy, right eye: Secondary | ICD-10-CM | POA: Diagnosis not present

## 2022-06-05 DIAGNOSIS — Z85828 Personal history of other malignant neoplasm of skin: Secondary | ICD-10-CM | POA: Diagnosis not present

## 2022-06-05 DIAGNOSIS — L821 Other seborrheic keratosis: Secondary | ICD-10-CM | POA: Diagnosis not present

## 2022-06-05 LAB — PROTEIN ELECTROPHORESIS, SERUM, WITH REFLEX
A/G Ratio: 1.4 (ref 0.7–1.7)
Albumin ELP: 3.6 g/dL (ref 2.9–4.4)
Alpha-1-Globulin: 0.2 g/dL (ref 0.0–0.4)
Alpha-2-Globulin: 0.7 g/dL (ref 0.4–1.0)
Beta Globulin: 0.8 g/dL (ref 0.7–1.3)
Gamma Globulin: 0.8 g/dL (ref 0.4–1.8)
Globulin, Total: 2.5 g/dL (ref 2.2–3.9)
M-Spike, %: 0.6 g/dL — ABNORMAL HIGH
SPEP Interpretation: 0
Total Protein ELP: 6.1 g/dL (ref 6.0–8.5)

## 2022-06-05 LAB — IMMUNOFIXATION REFLEX, SERUM
IgA: 7 mg/dL — ABNORMAL LOW (ref 64–422)
IgG (Immunoglobin G), Serum: 1001 mg/dL (ref 586–1602)
IgM (Immunoglobulin M), Srm: 10 mg/dL — ABNORMAL LOW (ref 26–217)

## 2022-06-17 ENCOUNTER — Telehealth: Payer: Self-pay | Admitting: Internal Medicine

## 2022-06-17 ENCOUNTER — Telehealth: Payer: Self-pay

## 2022-06-17 ENCOUNTER — Encounter: Payer: Self-pay | Admitting: Hematology & Oncology

## 2022-06-17 ENCOUNTER — Ambulatory Visit (INDEPENDENT_AMBULATORY_CARE_PROVIDER_SITE_OTHER): Payer: PPO | Admitting: Internal Medicine

## 2022-06-17 ENCOUNTER — Encounter: Payer: Self-pay | Admitting: Internal Medicine

## 2022-06-17 VITALS — BP 124/70 | HR 94 | Temp 99.7°F

## 2022-06-17 DIAGNOSIS — F32A Depression, unspecified: Secondary | ICD-10-CM

## 2022-06-17 DIAGNOSIS — G894 Chronic pain syndrome: Secondary | ICD-10-CM | POA: Diagnosis not present

## 2022-06-17 DIAGNOSIS — F419 Anxiety disorder, unspecified: Secondary | ICD-10-CM

## 2022-06-17 DIAGNOSIS — G44211 Episodic tension-type headache, intractable: Secondary | ICD-10-CM | POA: Diagnosis not present

## 2022-06-17 DIAGNOSIS — M7918 Myalgia, other site: Secondary | ICD-10-CM

## 2022-06-17 DIAGNOSIS — I10 Essential (primary) hypertension: Secondary | ICD-10-CM

## 2022-06-17 DIAGNOSIS — F439 Reaction to severe stress, unspecified: Secondary | ICD-10-CM | POA: Diagnosis not present

## 2022-06-17 DIAGNOSIS — C9000 Multiple myeloma not having achieved remission: Secondary | ICD-10-CM | POA: Diagnosis not present

## 2022-06-17 MED ORDER — ALPRAZOLAM 0.25 MG PO TABS: 0.2500 mg | ORAL_TABLET | Freq: Two times a day (BID) | ORAL | 0 refills | Status: DC | PRN

## 2022-06-17 MED ORDER — OXYCODONE-ACETAMINOPHEN 5-325 MG PO TABS: 1.0000 | ORAL_TABLET | Freq: Three times a day (TID) | ORAL | 0 refills | Status: AC | PRN

## 2022-06-17 NOTE — Patient Instructions (Signed)
Is to have CT without contrast due to protracted headache.  Take Xanax sparingly for anxiety and oxycodone APAP sparingly for pain.

## 2022-06-17 NOTE — Telephone Encounter (Signed)
Spoke with Tammy R. from [facility] and No prior Josem Kaufmann is required for CT Head W/o contrast  Ref# [id number] patient is scheduled 11/28 @ 9:40 patient notified

## 2022-06-17 NOTE — Telephone Encounter (Signed)
Debra Ruiz [phone]  Debra Ruiz called to say she has a headache she can not get rid of, it has been going on for a couple of weeks, she has tried everything she knows of. I scheduled her for this afternoon.

## 2022-06-17 NOTE — Progress Notes (Signed)
   Subjective:    Patient ID: Debra Ruiz, female    DOB: 12/30/1945, 76 y.o.   MRN: [id number]  HPI 76 year old Female seen for protracted headache for 2 months. History of multiple myeloma seen by Dr. Marin Olp.  Patient's husband has dementia and she is under considerable amount of stress.  She is also being seen by Dr. Nelva Bush for some musculoskeletal pain and cervical radiculopathy.  She recently received an injection for pain in her right back area by Dr. Herma Mering but she does not think it helped very much.  She simply has not felt very well and is anxious about that.  Has been having some headaches as well.  She has no fever or shaking chills.  Has not had any neurological deficits with these headaches but is frankly frustrated today.  Currently receiving carfilzomib per Dr. Marin Olp for multiple myeloma.  She saw Dr. Marin Olp in early November.  He felt her myeloma was stable at that time.  She been having some eye issues and is seeing ophthalmology.  She has not been taking anxiety medication but I think given her frustrations today she would benefit from that.  I have of Xanax 0.25 mg to take twice daily as needed.  She was prescribed Wellbutrin in September.  She has been having some issues with headache and upper back pain.  She is frustrated that she has not gotten relief.  I have given her small quantity of oxycodone APAP 5/325 to take sparingly with food for severe pain.  She has been prescribed Ambien by Dr. Marin Olp for sleep.  She also takes Wellbutrin.  She has a history of hypertension treated with diastolic.      Review of Systems no visual disturbances, no nausea or vomiting.     Objective:   Physical Exam Vital signs reviewed.  She is very anxious today.  Complaining of headache but no vomiting.  She has temperature of 99.7 degrees by tympanic thermometer.  Pulse oximetry is 98% pulse is 94 and regular.  Blood pressure 124/70.  Her pupils are miotic.  Extraocular movements are full.   No nystagmus.  She moves all 4 extremities.  She is frustrated and a bit angry.  She has point tenderness in her right upper parathoracic area.  She is complaining of headache.       Assessment & Plan:   Protracted headache-it is not clear to me if this is a protracted migraine headache, muscle contraction headache or some other process.  I have ordered a CT of her head to be done this week.  I have given her small quantity of oxycodone APAP 5/325 to take sparingly for pain.  She has Ambien that she takes nightly for sleep.  She will call if not improving in a couple of days.  Multiple myeloma being treated by Dr. Marin Olp  Situational stress with husband who has memory issues  Anxiety state-have prescribed Xanax  Time spent with patient is 30 minutes.  Head CT to be done later this week.

## 2022-06-18 ENCOUNTER — Ambulatory Visit
Admission: RE | Admit: 2022-06-18 | Discharge: 2022-06-18 | Disposition: A | Discharge status: Home / Self Care | Payer: PPO | Source: Ambulatory Visit | Attending: Internal Medicine | Admitting: Internal Medicine

## 2022-06-18 ENCOUNTER — Other Ambulatory Visit: Payer: Self-pay | Admitting: Hematology & Oncology

## 2022-06-18 DIAGNOSIS — G9389 Other specified disorders of brain: Secondary | ICD-10-CM | POA: Diagnosis not present

## 2022-06-18 DIAGNOSIS — R519 Headache, unspecified: Secondary | ICD-10-CM | POA: Diagnosis not present

## 2022-06-19 ENCOUNTER — Other Ambulatory Visit: Payer: Self-pay | Admitting: Pharmacist

## 2022-06-20 DIAGNOSIS — M5416 Radiculopathy, lumbar region: Secondary | ICD-10-CM | POA: Diagnosis not present

## 2022-06-20 DIAGNOSIS — M542 Cervicalgia: Secondary | ICD-10-CM | POA: Diagnosis not present

## 2022-06-20 DIAGNOSIS — M545 Low back pain, unspecified: Secondary | ICD-10-CM | POA: Diagnosis not present

## 2022-06-20 DIAGNOSIS — M5451 Vertebrogenic low back pain: Secondary | ICD-10-CM | POA: Diagnosis not present

## 2022-06-20 DIAGNOSIS — M47812 Spondylosis without myelopathy or radiculopathy, cervical region: Secondary | ICD-10-CM | POA: Diagnosis not present

## 2022-06-20 DIAGNOSIS — M5412 Radiculopathy, cervical region: Secondary | ICD-10-CM | POA: Diagnosis not present

## 2022-06-24 DIAGNOSIS — D2272 Melanocytic nevi of left lower limb, including hip: Secondary | ICD-10-CM | POA: Diagnosis not present

## 2022-06-24 DIAGNOSIS — D692 Other nonthrombocytopenic purpura: Secondary | ICD-10-CM | POA: Diagnosis not present

## 2022-06-24 DIAGNOSIS — L814 Other melanin hyperpigmentation: Secondary | ICD-10-CM | POA: Diagnosis not present

## 2022-06-24 DIAGNOSIS — D225 Melanocytic nevi of trunk: Secondary | ICD-10-CM | POA: Diagnosis not present

## 2022-06-24 DIAGNOSIS — D2271 Melanocytic nevi of right lower limb, including hip: Secondary | ICD-10-CM | POA: Diagnosis not present

## 2022-06-24 DIAGNOSIS — D2262 Melanocytic nevi of left upper limb, including shoulder: Secondary | ICD-10-CM | POA: Diagnosis not present

## 2022-06-24 DIAGNOSIS — D2261 Melanocytic nevi of right upper limb, including shoulder: Secondary | ICD-10-CM | POA: Diagnosis not present

## 2022-06-24 DIAGNOSIS — L821 Other seborrheic keratosis: Secondary | ICD-10-CM | POA: Diagnosis not present

## 2022-06-24 DIAGNOSIS — Z85828 Personal history of other malignant neoplasm of skin: Secondary | ICD-10-CM | POA: Diagnosis not present

## 2022-06-24 DIAGNOSIS — D1801 Hemangioma of skin and subcutaneous tissue: Secondary | ICD-10-CM | POA: Diagnosis not present

## 2022-06-26 ENCOUNTER — Inpatient Hospital Stay: Payer: PPO

## 2022-06-26 ENCOUNTER — Inpatient Hospital Stay: Payer: PPO | Attending: Hematology & Oncology

## 2022-06-26 ENCOUNTER — Encounter: Payer: Self-pay | Admitting: Family

## 2022-06-26 ENCOUNTER — Inpatient Hospital Stay (HOSPITAL_BASED_OUTPATIENT_CLINIC_OR_DEPARTMENT_OTHER): Payer: PPO | Admitting: Family

## 2022-06-26 VITALS — BP 141/66 | HR 91 | Temp 97.9°F | Resp 20 | Ht 59.0 in | Wt 100.0 lb

## 2022-06-26 DIAGNOSIS — R634 Abnormal weight loss: Secondary | ICD-10-CM

## 2022-06-26 DIAGNOSIS — Z79899 Other long term (current) drug therapy: Secondary | ICD-10-CM | POA: Diagnosis not present

## 2022-06-26 DIAGNOSIS — Z5112 Encounter for antineoplastic immunotherapy: Secondary | ICD-10-CM | POA: Insufficient documentation

## 2022-06-26 DIAGNOSIS — R630 Anorexia: Secondary | ICD-10-CM

## 2022-06-26 DIAGNOSIS — C9000 Multiple myeloma not having achieved remission: Secondary | ICD-10-CM

## 2022-06-26 DIAGNOSIS — D509 Iron deficiency anemia, unspecified: Secondary | ICD-10-CM | POA: Diagnosis not present

## 2022-06-26 LAB — CBC WITH DIFFERENTIAL (CANCER CENTER ONLY)
Abs Immature Granulocytes: 0.04 10*3/uL (ref 0.00–0.07)
Basophils Absolute: 0 10*3/uL (ref 0.0–0.1)
Basophils Relative: 0 %
Eosinophils Absolute: 0.1 10*3/uL (ref 0.0–0.5)
Eosinophils Relative: 1 %
HCT: 40.3 % (ref 36.0–46.0)
Hemoglobin: 13.2 g/dL (ref 12.0–15.0)
Immature Granulocytes: 0 %
Lymphocytes Relative: 10 %
Lymphs Abs: 0.9 10*3/uL (ref 0.7–4.0)
MCH: 32.9 pg (ref 26.0–34.0)
MCHC: 32.8 g/dL (ref 30.0–36.0)
MCV: 100.5 fL — ABNORMAL HIGH (ref 80.0–100.0)
Monocytes Absolute: 0.7 10*3/uL (ref 0.1–1.0)
Monocytes Relative: 7 %
Neutro Abs: 7.3 10*3/uL (ref 1.7–7.7)
Neutrophils Relative %: 82 %
Platelet Count: 212 10*3/uL (ref 150–400)
RBC: 4.01 MIL/uL (ref 3.87–5.11)
RDW: 13.2 % (ref 11.5–15.5)
WBC Count: 8.9 10*3/uL (ref 4.0–10.5)
nRBC: 0 % (ref 0.0–0.2)

## 2022-06-26 LAB — CMP (CANCER CENTER ONLY)
ALT: 13 U/L (ref 0–44)
AST: 13 U/L — ABNORMAL LOW (ref 15–41)
Albumin: 4.3 g/dL (ref 3.5–5.0)
Alkaline Phosphatase: 57 U/L (ref 38–126)
Anion gap: 10 (ref 5–15)
BUN: 27 mg/dL — ABNORMAL HIGH (ref 8–23)
CO2: 24 mmol/L (ref 22–32)
Calcium: 9.2 mg/dL (ref 8.9–10.3)
Chloride: 105 mmol/L (ref 98–111)
Creatinine: 0.67 mg/dL (ref 0.44–1.00)
GFR, Estimated: >60 mL/min (ref >60)
Glucose, Bld: 159 mg/dL — ABNORMAL HIGH (ref 70–99)
Potassium: 4.1 mmol/L (ref 3.5–5.1)
Sodium: 139 mmol/L (ref 135–145)
Total Bilirubin: 0.5 mg/dL (ref 0.3–1.2)
Total Protein: 7 g/dL (ref 6.5–8.1)

## 2022-06-26 LAB — LACTATE DEHYDROGENASE: LDH: 177 U/L (ref 98–192)

## 2022-06-26 MED ORDER — DRONABINOL 2.5 MG PO CAPS: 2.5000 mg | ORAL_CAPSULE | Freq: Two times a day (BID) | ORAL | 3 refills | Status: DC

## 2022-06-26 MED ORDER — DEXTROSE 5 % IV SOLN
70.0000 mg/m2 | Freq: Once | INTRAVENOUS | Status: AC
  Administered 2022-06-26: 100 mg via INTRAVENOUS
  Filled 2022-06-26: qty 5

## 2022-06-26 MED ORDER — SODIUM CHLORIDE 0.9 % IV SOLN
20.0000 mg | Freq: Once | INTRAVENOUS | Status: AC
  Administered 2022-06-26: 20 mg via INTRAVENOUS
  Filled 2022-06-26: qty 20

## 2022-06-26 MED ORDER — ACETAMINOPHEN 325 MG PO TABS: 650.0000 mg | ORAL_TABLET | Freq: Once | ORAL | Status: DC

## 2022-06-26 MED ORDER — DENOSUMAB 120 MG/1.7ML ~~LOC~~ SOLN
120.0000 mg | Freq: Once | SUBCUTANEOUS | Status: AC
  Administered 2022-06-26: 120 mg via SUBCUTANEOUS
  Filled 2022-06-26: qty 1.7

## 2022-06-26 MED ORDER — DIPHENHYDRAMINE HCL 25 MG PO CAPS: 25.0000 mg | ORAL_CAPSULE | Freq: Once | ORAL | Status: DC

## 2022-06-26 MED ORDER — PROCHLORPERAZINE MALEATE 10 MG PO TABS
10.0000 mg | ORAL_TABLET | Freq: Once | ORAL | Status: AC
  Administered 2022-06-26: 10 mg via ORAL
  Filled 2022-06-26: qty 1

## 2022-06-26 MED ORDER — DARATUMUMAB-HYALURONIDASE-FIHJ 1800-30000 MG-UT/15ML ~~LOC~~ SOLN
1800.0000 mg | Freq: Once | SUBCUTANEOUS | Status: AC
  Administered 2022-06-26: 1800 mg via SUBCUTANEOUS
  Filled 2022-06-26: qty 15

## 2022-06-26 MED ORDER — SODIUM CHLORIDE 0.9 % IV SOLN: Freq: Once | INTRAVENOUS | Status: DC

## 2022-06-26 MED ORDER — SODIUM CHLORIDE 0.9 % IV SOLN: Freq: Once | INTRAVENOUS | Status: AC

## 2022-06-26 NOTE — Progress Notes (Signed)
Hematology and Oncology Follow Up Visit  Debra Ruiz [id number] January 21, 1946 76 y.o. 06/26/2022   Principle Diagnosis:  IgG Kappa myeloma - compression fractures - 13q-, +11, 14q+ - hyperdiploid -- progressive Iron def anemia   Completed Therapy: S/p Kyphoplasty of T11 and T12 XRT to lower spine-completed on 09/25/2016 Ninlaro 2.3 mg po q week (3 on/1 off) -- start on 01/24/2020 -- d/c on 03/29/2020 due to toxicity Velcade/Pomalyst/Decadron -Start on 09/02/2018 --Pomalyst on hold since 11/09/2018.  D/C on 01/17/2020   Current Therapy:        Faspro -- start on 04/05/2020 -- s/p cycle #27 -- Kyprolis added on 07/20/2020 -- s/p cycle #23 Xgeva 125m Sq q  3 month - next dose in 08/2022  IV iron as indicated    Interim History:  Ms. JBramblettis here today for follow-up and treatment. She is having a hard time with persistent headaches. She states that her PCP did a CT of the head last week and determined that her headaches are due to hypertension and stress. She has restarted her Xanax which causes mild dizziness at times. She states that ultram has not been very helpful with her pain.  She is under a lot of stress caring for her husband who has alzheimer's.  M-spike last month was 0.6 g/dL, IgG level was 964 mg/dL and kappa light chains 6.48 mg/dL.  She recently had an upper respiratory infection and has completed treatment with antibiotics.  No fever, chills, n/v, cough, rash, SOB, chest pain, palpitations, abdominal pain or changes in bowel or bladder habits.  She has not noted any blood loss, abnormal bruising. No petechiae.  No swelling in her extremities.  She states that steroid injections have not been very helpful for her pain associated with cervical radiculopathy.  She has neuropathy in her feet that is unchanged from baseline.  No falls or syncope to report. She ambulates with a Rolator as needed for added support.  She states that food has not taste and that she will typically  just eat small meals when her husband eats. She will sometimes wake in the middle of the night and be very hungry. When this occurs she will get up and fix whatever she is craving. She is doing her best to stay well hydrated. Her weight is stable at 100 lbs.   ECOG Performance Status: 1 - Symptomatic but completely ambulatory  Medications:  Allergies as of 06/26/2022       Reactions   Crestor [rosuvastatin Calcium] Other (See Comments)   Causes liver functions to be elevated        Medication List        Accurate as of June 26, 2022  9:16 AM. If you have any questions, ask your nurse or doctor.          ALPRAZolam 0.25 MG tablet Commonly known as: Xanax Take 1 tablet (0.25 mg total) by mouth 2 (two) times daily as needed for anxiety.   amLODipine 5 MG tablet Commonly known as: NORVASC TAKE 1 TABLET(5 MG) BY MOUTH DAILY   aspirin EC 81 MG tablet Take 81 mg by mouth every other day.   atorvastatin 20 MG tablet Commonly known as: LIPITOR TAKE 1 TABLET(20 MG) BY MOUTH DAILY   buPROPion 300 MG 24 hr tablet Commonly known as: WELLBUTRIN XL TAKE 1 TABLET(300 MG) BY MOUTH DAILY   dexamethasone 4 MG tablet Commonly known as: DECADRON TAKE 3 TABLETS BY MOUTH 1 TIME A WEEK   famciclovir  250 MG tablet Commonly known as: FAMVIR TAKE 1 TABLET(250 MG) BY MOUTH DAILY   Flarex 0.1 % ophthalmic suspension Generic drug: fluorometholone   mirabegron ER 25 MG Tb24 tablet Commonly known as: Myrbetriq Take 1 tablet (25 mg total) by mouth daily.   naloxone 4 MG/0.1ML Liqd nasal spray kit Commonly known as: NARCAN CALL 911. SPR CONTENTS OF ONE SPRAYER (0.1ML) INTO ONE NOSTRIL. REPEAT IN 2-3 MIN IF SYMPTOMS OF OPIOID EMERGENCY PERSIST, ALTERNATE NOSTRILS   nebivolol 5 MG tablet Commonly known as: Bystolic TAKE 1 TABLET(5 MG) BY MOUTH DAILY   Oxervate 0.002 % Soln Generic drug: Cenegermin-bkbj   potassium chloride SA 20 MEQ tablet Commonly known as: KLOR-CON M TAKE 1  TABLET BY MOUTH DAILY   prochlorperazine 10 MG tablet Commonly known as: COMPAZINE TAKE 1 TABLET(10 MG) BY MOUTH EVERY 6 HOURS AS NEEDED FOR NAUSEA OR VOMITING   REFRESH DRY EYE THERAPY OP Apply to eye 3 (three) times daily.   triamcinolone 0.1 % cream : eucerin Crea triamcinolone cr. 0.1%/eucerin cr.  APPLY TO SKIN TWICE DAILY FOR ITCHING   Vitamin B-12 5000 MCG Tbdp Take by mouth daily.   Vitamin D 50 MCG (2000 UT) Caps Take 2,000 Units by mouth daily.   zolpidem 10 MG tablet Commonly known as: AMBIEN TAKE 1 TABLET(10 MG) BY MOUTH AT BEDTIME        Allergies:  Allergies  Allergen Reactions   Crestor [Rosuvastatin Calcium] Other (See Comments)    Causes liver functions to be elevated    Past Medical History, Surgical history, Social history, and Family History were reviewed and updated.  Review of Systems: All other 10 point review of systems is negative.   Physical Exam:  vitals were not taken for this visit.   Wt Readings from Last 3 Encounters:  05/29/22 100 lb (45.4 kg)  05/24/22 100 lb 6.4 oz (45.5 kg)  05/01/22 101 lb (45.8 kg)    Ocular: Sclerae unicteric, pupils equal, round and reactive to light Ear-nose-throat: Oropharynx clear, dentition fair Lymphatic: No cervical or supraclavicular adenopathy Lungs no rales or rhonchi, good excursion bilaterally Heart regular rate and rhythm, no murmur appreciated Abd soft, nontender, positive bowel sounds MSK no focal spinal tenderness, no joint edema Neuro: non-focal, well-oriented, appropriate affect Breasts: Deferred  Lab Results  Component Value Date   WBC 8.9 06/26/2022   HGB 13.2 06/26/2022   HCT 40.3 06/26/2022   MCV 100.5 (H) 06/26/2022   PLT 212 06/26/2022   Lab Results  Component Value Date   FERRITIN 258 10/25/2020   IRON 136 10/25/2020   TIBC 353 10/25/2020   UIBC 216 10/25/2020   IRONPCTSAT 39 10/25/2020   Lab Results  Component Value Date   RBC 4.01 06/26/2022   Lab Results   Component Value Date   KPAFRELGTCHN 64.8 (H) 05/29/2022   LAMBDASER <1.5 (L) 05/29/2022   KAPLAMBRATIO >43.20 (H) 05/29/2022   Lab Results  Component Value Date   IGGSERUM 964 05/29/2022   IGGSERUM 1,001 05/29/2022   IGA 6 (L) 05/29/2022   IGA 7 (L) 05/29/2022   IGMSERUM 10 (L) 05/29/2022   IGMSERUM 10 (L) 05/29/2022   Lab Results  Component Value Date   TOTALPROTELP 6.1 05/29/2022   ALBUMINELP 3.6 05/29/2022   A1GS 0.2 05/29/2022   A2GS 0.7 05/29/2022   BETS 0.8 05/29/2022   GAMS 0.8 05/29/2022   MSPIKE 0.6 (H) 05/29/2022   SPEI Comment 05/01/2022     Chemistry      Component  Value Date/Time   NA 140 05/29/2022 0819   NA 148 (H) 07/16/2017 1128   NA 143 03/05/2017 1051   K 3.4 (L) 05/29/2022 0819   K 3.8 07/16/2017 1128   K 3.7 03/05/2017 1051   CL 104 05/29/2022 0819   CL 106 07/16/2017 1128   CO2 26 05/29/2022 0819   CO2 29 07/16/2017 1128   CO2 25 03/05/2017 1051   BUN 24 (H) 05/29/2022 0819   BUN 23 (H) 07/16/2017 1128   BUN 18.2 03/05/2017 1051   CREATININE 0.76 05/29/2022 0819   CREATININE 0.75 06/20/2020 1127   CREATININE 0.8 03/05/2017 1051      Component Value Date/Time   CALCIUM 9.4 05/29/2022 0819   CALCIUM 9.7 07/16/2017 1128   CALCIUM 8.9 03/05/2017 1051   ALKPHOS 57 05/29/2022 0819   ALKPHOS 71 07/16/2017 1128   ALKPHOS 67 03/05/2017 1051   AST 18 05/29/2022 0819   AST 15 03/05/2017 1051   ALT 17 05/29/2022 0819   ALT 23 07/16/2017 1128   ALT 17 03/05/2017 1051   BILITOT 0.4 05/29/2022 0819   BILITOT 0.55 03/05/2017 1051       Impression and Plan: Ms. Virgen is a very pleasant 76 yo caucasian female with IgG Kappa Myeloma.  Protein studies are pending. I spoke wth Dr. Marin Olp who was also able to see the patient and we will see what this set up protein studies look like and then adjust treatment if needed.  We will proceed with today's treatment as planned.  We will also have her try Marinol for appetite stimulation.  Follow-up with  MD in 4 weeks.   Lottie Dawson, NP 12/6/20239:16 AM

## 2022-06-27 LAB — KAPPA/LAMBDA LIGHT CHAINS
Kappa free light chain: 47.3 mg/L — ABNORMAL HIGH (ref 3.3–19.4)
Kappa, lambda light chain ratio: >31.53 — ABNORMAL HIGH (ref 0.26–1.65)
Lambda free light chains: <1.5 mg/L — ABNORMAL LOW (ref 5.7–26.3)

## 2022-06-28 ENCOUNTER — Telehealth: Payer: Self-pay | Admitting: *Deleted

## 2022-06-28 ENCOUNTER — Other Ambulatory Visit: Payer: Self-pay

## 2022-06-28 ENCOUNTER — Encounter: Payer: Self-pay | Admitting: Internal Medicine

## 2022-06-28 ENCOUNTER — Other Ambulatory Visit: Payer: PPO

## 2022-06-28 ENCOUNTER — Ambulatory Visit (INDEPENDENT_AMBULATORY_CARE_PROVIDER_SITE_OTHER): Payer: PPO | Admitting: Internal Medicine

## 2022-06-28 ENCOUNTER — Telehealth: Payer: Self-pay | Admitting: Internal Medicine

## 2022-06-28 VITALS — BP 100/60 | HR 111 | Temp 99.0°F

## 2022-06-28 DIAGNOSIS — I10 Essential (primary) hypertension: Secondary | ICD-10-CM | POA: Diagnosis not present

## 2022-06-28 DIAGNOSIS — J069 Acute upper respiratory infection, unspecified: Secondary | ICD-10-CM

## 2022-06-28 DIAGNOSIS — F32A Depression, unspecified: Secondary | ICD-10-CM | POA: Diagnosis not present

## 2022-06-28 DIAGNOSIS — R69 Illness, unspecified: Secondary | ICD-10-CM

## 2022-06-28 DIAGNOSIS — E7800 Pure hypercholesterolemia, unspecified: Secondary | ICD-10-CM | POA: Diagnosis not present

## 2022-06-28 DIAGNOSIS — F411 Generalized anxiety disorder: Secondary | ICD-10-CM

## 2022-06-28 DIAGNOSIS — R5383 Other fatigue: Secondary | ICD-10-CM

## 2022-06-28 DIAGNOSIS — F419 Anxiety disorder, unspecified: Secondary | ICD-10-CM | POA: Diagnosis not present

## 2022-06-28 DIAGNOSIS — F439 Reaction to severe stress, unspecified: Secondary | ICD-10-CM

## 2022-06-28 MED ORDER — SERTRALINE HCL 50 MG PO TABS: ORAL_TABLET | ORAL | 3 refills | Status: DC

## 2022-06-28 NOTE — Telephone Encounter (Signed)
scheduled

## 2022-06-28 NOTE — Telephone Encounter (Signed)
Debra Ruiz [phone]  [name] just showed up, thinking she had a lab today, she does not it is Monday, she is sick with runny nose, sniffling, sneezing, headache. She says she is no better than she was a week ago. Do you want to see her. I sent her back out to car.

## 2022-06-28 NOTE — Telephone Encounter (Signed)
-----   Message from [name], MD sent at 06/27/2022  4:54 PM EST ----- Call and let him know that the light chains actually came down a little bit.  This is a good thing.  Hopefully, we can just keep her on the same protocol.  Thanks.  [name]

## 2022-06-28 NOTE — Telephone Encounter (Signed)
Pt notified per order of Dr. Marin Olp that "the light chains actually came down a little bit.  This is a good thing.   Hopefully, we can just keep on the same protocol.  Thanks.  Pete"  Pt is appreciative of call and has no questions or concerns at this time.

## 2022-06-28 NOTE — Progress Notes (Signed)
   Subjective:    Patient ID: Debra Ruiz, female    DOB: [birth year], 76 y.o.   MRN: [id number]  HPI 76 year old Female seen today acutely with runny nose and maxillary sinus tenderness.  She has a history of multiple myeloma not having achieved remission and is followed and treated by Dr. Marin Olp, Oncologist.  She has situational stress with her husband who has dementia.  She has a history of anxiety and is upset today.  Husband is getting on her nerves with his confusion and agitation.  It is making her anxious.    Review of Systems  patient reports lexapro has caused itching.     Objective:   Physical Exam Vital signs reviewed.  Today she is anxious and her pulse is 111 and regular.  Blood pressure 100/60 temperature 99 degrees by ear thermometer pulse oximetry 98% on room air.  She does not sound nasally congested when she speaks but has maxillary sinus pressure and tenderness.  She was not weighed today because she was here for an acute illness and was ushered into a secure room away from other patients.  TMs are clear.  Pharynx is very slightly injected without exudate.  Neck is supple without cervical adenopathy.  Chest is clear.     Assessment & Plan:   Acute upper respiratory infection-Clines antibiotic treatment today  Situational stress  Anxiety state-resulting in tachycardia  Plan: Zoloft 50 mg daily start with 1/2 tablet for few days then increase to a whole tablet daily.  She has previously been prescribed Xanax for anxiety and should take it on difficult days.  She does not want to go to counseling regarding her husband's illness and her situational stress.  She has follow-up appointment here soon.  Spent 25 minutes with patient today allowing her to vent her frustration with situational stress with her chronic illness and husband's illness.

## 2022-06-29 ENCOUNTER — Encounter: Payer: Self-pay | Admitting: Hematology & Oncology

## 2022-06-29 LAB — COMPLETE METABOLIC PANEL WITH GFR
AG Ratio: 1.5 (calc) (ref 1.0–2.5)
ALT: 19 U/L (ref 6–29)
AST: 15 U/L (ref 10–35)
Albumin: 3.8 g/dL (ref 3.6–5.1)
Alkaline phosphatase (APISO): 61 U/L (ref 37–153)
BUN/Creatinine Ratio: 30 (calc) — ABNORMAL HIGH (ref 6–22)
BUN: 26 mg/dL — ABNORMAL HIGH (ref 7–25)
CO2: 21 mmol/L (ref 20–32)
Calcium: 9.1 mg/dL (ref 8.6–10.4)
Chloride: 107 mmol/L (ref 98–110)
Creat: 0.86 mg/dL (ref 0.60–1.00)
Globulin: 2.5 g/dL (calc) (ref 1.9–3.7)
Glucose, Bld: 138 mg/dL — ABNORMAL HIGH (ref 65–99)
Potassium: 4.5 mmol/L (ref 3.5–5.3)
Sodium: 140 mmol/L (ref 135–146)
Total Bilirubin: 0.3 mg/dL (ref 0.2–1.2)
Total Protein: 6.3 g/dL (ref 6.1–8.1)
eGFR: 70 mL/min/{1.73_m2} (ref >=60)

## 2022-06-29 LAB — CBC WITH DIFFERENTIAL/PLATELET
Absolute Monocytes: 592 cells/uL (ref 200–950)
Basophils Absolute: 0 cells/uL (ref 0–200)
Basophils Relative: 0 %
Eosinophils Absolute: 37 cells/uL (ref 15–500)
Eosinophils Relative: 0.5 %
HCT: 38.1 % (ref 35.0–45.0)
Hemoglobin: 13.1 g/dL (ref 11.7–15.5)
Lymphs Abs: 659 cells/uL — ABNORMAL LOW (ref 850–3900)
MCH: 33.8 pg — ABNORMAL HIGH (ref 27.0–33.0)
MCHC: 34.4 g/dL (ref 32.0–36.0)
MCV: 98.2 fL (ref 80.0–100.0)
MPV: 9.8 fL (ref 7.5–12.5)
Monocytes Relative: 8 %
Neutro Abs: 6112 cells/uL (ref 1500–7800)
Neutrophils Relative %: 82.6 %
Platelets: 124 10*3/uL — ABNORMAL LOW (ref 140–400)
RBC: 3.88 10*6/uL (ref 3.80–5.10)
RDW: 13.1 % (ref 11.0–15.0)
Total Lymphocyte: 8.9 %
WBC: 7.4 10*3/uL (ref 3.8–10.8)

## 2022-06-29 LAB — LIPID PANEL
Cholesterol: 190 mg/dL (ref <200)
HDL: 66 mg/dL (ref >=50)
LDL Cholesterol (Calc): 97 mg/dL (calc)
Non-HDL Cholesterol (Calc): 124 mg/dL (calc) (ref <130)
Total CHOL/HDL Ratio: 2.9 (calc) (ref <5.0)
Triglycerides: 178 mg/dL — ABNORMAL HIGH (ref <150)

## 2022-06-29 LAB — IGG, IGA, IGM
IgA: <5 mg/dL — ABNORMAL LOW (ref 64–422)
IgG (Immunoglobin G), Serum: 961 mg/dL (ref 586–1602)
IgM (Immunoglobulin M), Srm: 16 mg/dL — ABNORMAL LOW (ref 26–217)

## 2022-06-29 LAB — TSH: TSH: 1.19 mIU/L (ref 0.40–4.50)

## 2022-06-30 ENCOUNTER — Other Ambulatory Visit: Payer: Self-pay

## 2022-07-01 ENCOUNTER — Other Ambulatory Visit: Payer: PPO

## 2022-07-02 ENCOUNTER — Ambulatory Visit (INDEPENDENT_AMBULATORY_CARE_PROVIDER_SITE_OTHER): Payer: PPO | Admitting: Internal Medicine

## 2022-07-02 ENCOUNTER — Encounter: Payer: Self-pay | Admitting: Internal Medicine

## 2022-07-02 VITALS — BP 118/70 | HR 98 | Temp 99.1°F | Ht 59.0 in | Wt 100.1 lb

## 2022-07-02 DIAGNOSIS — R739 Hyperglycemia, unspecified: Secondary | ICD-10-CM

## 2022-07-02 DIAGNOSIS — F419 Anxiety disorder, unspecified: Secondary | ICD-10-CM | POA: Diagnosis not present

## 2022-07-02 DIAGNOSIS — F32A Depression, unspecified: Secondary | ICD-10-CM

## 2022-07-02 DIAGNOSIS — Z0000 Encounter for general adult medical examination without abnormal findings: Secondary | ICD-10-CM | POA: Diagnosis not present

## 2022-07-02 DIAGNOSIS — R54 Age-related physical debility: Secondary | ICD-10-CM

## 2022-07-02 DIAGNOSIS — I10 Essential (primary) hypertension: Secondary | ICD-10-CM | POA: Diagnosis not present

## 2022-07-02 DIAGNOSIS — E7800 Pure hypercholesterolemia, unspecified: Secondary | ICD-10-CM

## 2022-07-02 DIAGNOSIS — N3941 Urge incontinence: Secondary | ICD-10-CM

## 2022-07-02 DIAGNOSIS — F439 Reaction to severe stress, unspecified: Secondary | ICD-10-CM | POA: Diagnosis not present

## 2022-07-02 DIAGNOSIS — C9000 Multiple myeloma not having achieved remission: Secondary | ICD-10-CM | POA: Diagnosis not present

## 2022-07-02 NOTE — Progress Notes (Signed)
Subjective:    Patient ID: Debra Ruiz, female    DOB: 01/09/1946, 76 y.o.   MRN: [id number]  HPI 76 year old Female seen for  Medicare wellnesshealth maintenance exam and evaluation of medical issues.  She has a history of multiple myeloma under the care of Dr. Marin Olp.  In August 2017 she was doing yard work and strained her back.  She was treated conservatively with meloxicam and Flexeril.  In October 2017 she was seen with left-sided sciatica with history of spondylosis L3-L4 and moderately severe canal stenosis based on MRI done in 2011 with anterolisthesis.  [place] going to [place] bathroom late at night September 2017.  Was complaining of pain in left buttock going down the left leg and pain in quadriceps muscle.  She had an MRI showing moderate compression fracture of T12.  This appeared to be subacute and a benign fracture.  Was also seen in December 2017 with back pain after going into an attic the previous day.  Described severe pain but had not done any heavy lifting.  Was thought to have musculoskeletal pain and was treated with narcotic pain medication and a prednisone dose pack.  She was subsequently seen by Dr. Lynann Bologna at [facility] and was found to have multiple marrow space lesions consistent with multiple myeloma.  She also was found to have multifactorial lumbar stenosis.  She was referred to Dr. Marin Olp who diagnosed her with IgG multiple myeloma.  She did have a T11-T12 kyphoplasty done in February 2018.  Was treated with chemotherapy and radiation.  She had surgery by Dr. Lynann Bologna in October 2020 involving a C3-C7 posterior cervical decompression fusion with instrumentation and allograft.  She is able to drive.  She has to rest frequently during the day.  She goes out to eat with friends and gets her nails done.  In 2019 developed an apparent left Horner syndrome saw Dr. Kathrin Penner who did an extensive evaluation which was negative.  This subsequently  improved.  Unable to tolerate Crestor for hyperlipidemia according to old records when she was under the care of Dr. Sharene Butters.  Apparently had some increased liver functions on Crestor.  Past medical history: History of herpes simplex type I.  Tubal ligation 1977.  History of hypertension, hyperlipidemia, anxiety, depression, GE reflux and insomnia.  Social history: She is married.  Previously was a smoker but is now a non-smoker.  She previously lived in [place] and came to live in [place].  Also previously worked for an [employer] consultant.  Has been married 3 times.  No children.  Has a twin sister.  Family history: Father died at age 6 of an MI with hypertension.  Mother died at age 22 of a stroke with history of hypertension.  Brother with history of brain tumor and cardiac defibrillator.  Twin sister lives in [place] and has hypertension and hyperlipidemia.  There is a strong family history of Alzheimer's disease.    Review of Systems decreased energy and chronic musculoskeletal pain, anxiety /depression and sometimes irritability      Objective:   Physical Exam Vital signs reviewed.  Skin: Warm and dry.  No cervical adenopathy.  Chest clear.  Cardiac exam: Regular rate and rhythm.  Breast are without masses.  Abdomen is soft nondistended without hepatosplenomegaly masses or tenderness.  Pelvic exam deferred.  No lower extremity edema.  Brief neurological exam intact without gross focal deficits.  Affect, thought and judgment are normal.  Assessment & Plan:  Multiple myeloma under the excellent care of Dr. Marin Olp  Essential hypertension-stable on amlodipine and Bystolic  Hyperlipidemia treated with Atorvastatin- Normal lipid panel except for triglycerides of 178  Depression currently on Wellbutrin and Zoloft  Insomnia treated with Ambien  Overactive bladder treated with Myrbetriq  Dry eye syndrome treated by Dr. Lucita Ferrara  Situational stress-  husband has memory issues  E.coli UTI October treated with Cipro 250 mg twice daily x 7 days  Plan: Continue current meds and RTC here in one year or as needed.   She has a living will and healthcare power of attorney. We have requested a copy but not on file here. Continues to drive. Not able to exercise due to musculoskeletal pain. No issues with managing money. Grab bar in bathroom. No Life Alert device. Colonoscopy 2015. Hx ofOsteoporosis.

## 2022-07-03 ENCOUNTER — Other Ambulatory Visit: Payer: Self-pay

## 2022-07-03 DIAGNOSIS — C9000 Multiple myeloma not having achieved remission: Secondary | ICD-10-CM

## 2022-07-03 DIAGNOSIS — E782 Mixed hyperlipidemia: Secondary | ICD-10-CM

## 2022-07-03 LAB — PROTEIN ELECTROPHORESIS, SERUM, WITH REFLEX
A/G Ratio: 1.2 (ref 0.7–1.7)
Albumin ELP: 3.6 g/dL (ref 2.9–4.4)
Alpha-1-Globulin: 0.3 g/dL (ref 0.0–0.4)
Alpha-2-Globulin: 0.8 g/dL (ref 0.4–1.0)
Beta Globulin: 1 g/dL (ref 0.7–1.3)
Gamma Globulin: 0.9 g/dL (ref 0.4–1.8)
Globulin, Total: 2.9 g/dL (ref 2.2–3.9)
M-Spike, %: 0.7 g/dL — ABNORMAL HIGH
SPEP Interpretation: 0
Total Protein ELP: 6.5 g/dL (ref 6.0–8.5)

## 2022-07-03 LAB — IMMUNOFIXATION REFLEX, SERUM
IgA: 7 mg/dL — ABNORMAL LOW (ref 64–422)
IgG (Immunoglobin G), Serum: 938 mg/dL (ref 586–1602)
IgM (Immunoglobulin M), Srm: 15 mg/dL — ABNORMAL LOW (ref 26–217)

## 2022-07-03 MED ORDER — ATORVASTATIN CALCIUM 20 MG PO TABS: ORAL_TABLET | ORAL | 1 refills | Status: DC

## 2022-07-04 ENCOUNTER — Other Ambulatory Visit: Payer: Self-pay

## 2022-07-08 ENCOUNTER — Encounter: Payer: Self-pay | Admitting: *Deleted

## 2022-07-08 NOTE — Progress Notes (Signed)
Received approval for Dronabinol 2.5 mg by [employer] from 07/08/22-07/22/23.

## 2022-07-21 NOTE — Patient Instructions (Addendum)
Spent about 30 minutes speaking with her about situational stress and her anxiety level.  She was quite upset arriving today to the office.  She was agitated.  I am starting her on Zoloft 50 mg daily.  Will follow-up with her at time of health maintenance exam December 12.

## 2022-07-23 ENCOUNTER — Other Ambulatory Visit: Payer: Self-pay | Admitting: Hematology & Oncology

## 2022-07-24 ENCOUNTER — Encounter: Payer: Self-pay | Admitting: Hematology & Oncology

## 2022-07-24 ENCOUNTER — Other Ambulatory Visit: Payer: Self-pay

## 2022-07-24 ENCOUNTER — Inpatient Hospital Stay: Payer: PPO

## 2022-07-24 ENCOUNTER — Inpatient Hospital Stay (HOSPITAL_BASED_OUTPATIENT_CLINIC_OR_DEPARTMENT_OTHER): Payer: PPO | Admitting: Hematology & Oncology

## 2022-07-24 ENCOUNTER — Inpatient Hospital Stay: Payer: PPO | Attending: Hematology & Oncology

## 2022-07-24 VITALS — BP 126/65 | HR 88 | Temp 97.9°F | Resp 20 | Ht 59.0 in | Wt 100.8 lb

## 2022-07-24 DIAGNOSIS — Z79899 Other long term (current) drug therapy: Secondary | ICD-10-CM | POA: Diagnosis not present

## 2022-07-24 DIAGNOSIS — Z5112 Encounter for antineoplastic immunotherapy: Secondary | ICD-10-CM | POA: Diagnosis not present

## 2022-07-24 DIAGNOSIS — D509 Iron deficiency anemia, unspecified: Secondary | ICD-10-CM | POA: Diagnosis not present

## 2022-07-24 DIAGNOSIS — C9000 Multiple myeloma not having achieved remission: Secondary | ICD-10-CM

## 2022-07-24 LAB — CBC WITH DIFFERENTIAL (CANCER CENTER ONLY)
Abs Immature Granulocytes: 0.04 10*3/uL (ref 0.00–0.07)
Basophils Absolute: 0.1 10*3/uL (ref 0.0–0.1)
Basophils Relative: 1 %
Eosinophils Absolute: 0.1 10*3/uL (ref 0.0–0.5)
Eosinophils Relative: 2 %
HCT: 40 % (ref 36.0–46.0)
Hemoglobin: 13.1 g/dL (ref 12.0–15.0)
Immature Granulocytes: 1 %
Lymphocytes Relative: 14 %
Lymphs Abs: 0.9 10*3/uL (ref 0.7–4.0)
MCH: 33.4 pg (ref 26.0–34.0)
MCHC: 32.8 g/dL (ref 30.0–36.0)
MCV: 102 fL — ABNORMAL HIGH (ref 80.0–100.0)
Monocytes Absolute: 0.6 10*3/uL (ref 0.1–1.0)
Monocytes Relative: 9 %
Neutro Abs: 5 10*3/uL (ref 1.7–7.7)
Neutrophils Relative %: 73 %
Platelet Count: 252 10*3/uL (ref 150–400)
RBC: 3.92 MIL/uL (ref 3.87–5.11)
RDW: 13.9 % (ref 11.5–15.5)
WBC Count: 6.7 10*3/uL (ref 4.0–10.5)
nRBC: 0 % (ref 0.0–0.2)

## 2022-07-24 LAB — CMP (CANCER CENTER ONLY)
ALT: 11 U/L (ref 0–44)
AST: 13 U/L — ABNORMAL LOW (ref 15–41)
Albumin: 4.1 g/dL (ref 3.5–5.0)
Alkaline Phosphatase: 66 U/L (ref 38–126)
Anion gap: 8 (ref 5–15)
BUN: 20 mg/dL (ref 8–23)
CO2: 27 mmol/L (ref 22–32)
Calcium: 9.7 mg/dL (ref 8.9–10.3)
Chloride: 105 mmol/L (ref 98–111)
Creatinine: 0.67 mg/dL (ref 0.44–1.00)
GFR, Estimated: >60 mL/min (ref >60)
Glucose, Bld: 123 mg/dL — ABNORMAL HIGH (ref 70–99)
Potassium: 4.2 mmol/L (ref 3.5–5.1)
Sodium: 140 mmol/L (ref 135–145)
Total Bilirubin: 0.4 mg/dL (ref 0.3–1.2)
Total Protein: 7.1 g/dL (ref 6.5–8.1)

## 2022-07-24 LAB — LACTATE DEHYDROGENASE: LDH: 211 U/L — ABNORMAL HIGH (ref 98–192)

## 2022-07-24 MED ORDER — ACETAMINOPHEN 325 MG PO TABS: 650.0000 mg | ORAL_TABLET | Freq: Once | ORAL | Status: DC

## 2022-07-24 MED ORDER — CELECOXIB 50 MG PO CAPS: 50.0000 mg | ORAL_CAPSULE | Freq: Two times a day (BID) | ORAL | 3 refills | Status: DC

## 2022-07-24 MED ORDER — SODIUM CHLORIDE 0.9 % IV SOLN: Freq: Once | INTRAVENOUS | Status: AC

## 2022-07-24 MED ORDER — SODIUM CHLORIDE 0.9 % IV SOLN
20.0000 mg | Freq: Once | INTRAVENOUS | Status: AC
  Administered 2022-07-24: 20 mg via INTRAVENOUS
  Filled 2022-07-24: qty 20

## 2022-07-24 MED ORDER — DIPHENHYDRAMINE HCL 25 MG PO CAPS: 25.0000 mg | ORAL_CAPSULE | Freq: Once | ORAL | Status: DC

## 2022-07-24 MED ORDER — PROCHLORPERAZINE MALEATE 10 MG PO TABS: 10.0000 mg | ORAL_TABLET | Freq: Once | ORAL | Status: DC

## 2022-07-24 MED ORDER — DEXTROSE 5 % IV SOLN
70.0000 mg/m2 | Freq: Once | INTRAVENOUS | Status: AC
  Administered 2022-07-24: 100 mg via INTRAVENOUS
  Filled 2022-07-24: qty 30

## 2022-07-24 MED ORDER — DARATUMUMAB-HYALURONIDASE-FIHJ 1800-30000 MG-UT/15ML ~~LOC~~ SOLN
1800.0000 mg | Freq: Once | SUBCUTANEOUS | Status: AC
  Administered 2022-07-24: 1800 mg via SUBCUTANEOUS
  Filled 2022-07-24: qty 15

## 2022-07-24 NOTE — Progress Notes (Signed)
Hematology and Oncology Follow Up Visit  Debra Ruiz [id number] [birth year] 77 y.o. 07/24/2022   Principle Diagnosis:  IgG Kappa myeloma - compression fractures - 13q-, +11, 14q+ - hyperdiploid -- progressive Iron def anemia   Completed Therapy: S/p Kyphoplasty of T11 and T12 XRT to lower spine-completed on 09/25/2016 Ninlaro 2.3 mg po q week (3 on/1 off) -- start on 01/24/2020 -- d/c on 03/29/2020 due to toxicity Velcade/Pomalyst/Decadron -Start on 09/02/2018 --Pomalyst on hold since 11/09/2018.  D/C on 01/17/2020   Current Therapy:        Faspro -- start on 04/05/2020 -- s/p cycle #28 -- Kyprolis added on 07/20/2020 -- s/p cycle #24 Xgeva 174m Sq q  3 month - next dose in 08/2022  IV iron as indicated    Interim History:  Ms. JRosenbachis here today for follow-up and treatment.  She is not doing well.  She is having quite a few problems.  Some of the problems are her own health.  Some of the problems are from her husband who is having progressive dementia.  She did enjoy the holiday season.  [place] to [place] her.  She is having some visual issues.  She have a lot of arthritic issues.  I will try her on some Celebrex to see if this may help.  I think maybe low-dose Celebrex at 50 mg p.o. twice daily might not be a bad idea.  Thankfully, her last myeloma studies looked okay.  Her last monoclonal spike was 0.7 g/dL.  Her IgG level was 938 mg/dL.  The Kappa light chain was 4.7 mg/dL.  Her appetite is little bit down.  Unfortunately, her insurance will not cover Marinol.  She is mostly bothered by the arthritis.  She has had no problems with fever.  There has been no bleeding.  She has had no bruising.  Overall, I would have to say that her performance status right now is ECOG 2.    Medications:  Allergies as of 07/24/2022       Reactions   Crestor [rosuvastatin Calcium] Other (See Comments)   Causes liver functions to be elevated         Medication List        Accurate as of July 24, 2022  9:18 AM. If you have any questions, ask your nurse or doctor.          ALPRAZolam 0.25 MG tablet Commonly known as: Xanax Take 1 tablet (0.25 mg total) by mouth 2 (two) times daily as needed for anxiety.   amLODipine 5 MG tablet Commonly known as: NORVASC TAKE 1 TABLET(5 MG) BY MOUTH DAILY   aspirin EC 81 MG tablet Take 81 mg by mouth every other day.   atorvastatin 20 MG tablet Commonly known as: LIPITOR TAKE 1 TABLET(20 MG) BY MOUTH DAILY   buPROPion 300 MG 24 hr tablet Commonly known as: WELLBUTRIN XL TAKE 1 TABLET(300 MG) BY MOUTH DAILY   dexamethasone 4 MG tablet Commonly known as: DECADRON TAKE 3 TABLETS BY MOUTH 1 TIME A WEEK   dronabinol 2.5 MG capsule Commonly known as: MARINOL Take 1 capsule (2.5 mg total) by mouth 2 (two) times daily before a meal.   famciclovir 250 MG tablet Commonly known as: FAMVIR TAKE 1 TABLET(250 MG) BY MOUTH DAILY   Flarex 0.1 % ophthalmic suspension Generic drug: fluorometholone   fluorometholone 0.1 % ophthalmic suspension Commonly known as: EFLONE Place 1 drop into both  eyes 4 (four) times daily.   fluorometholone 0.1 % ophthalmic suspension Commonly known as: FML 1 drop daily.   mirabegron ER 25 MG Tb24 tablet Commonly known as: Myrbetriq Take 1 tablet (25 mg total) by mouth daily.   naloxone 4 MG/0.1ML Liqd nasal spray kit Commonly known as: NARCAN CALL 911. SPR CONTENTS OF ONE SPRAYER (0.1ML) INTO ONE NOSTRIL. REPEAT IN 2-3 MIN IF SYMPTOMS OF OPIOID EMERGENCY PERSIST, ALTERNATE NOSTRILS   nebivolol 5 MG tablet Commonly known as: Bystolic TAKE 1 TABLET(5 MG) BY MOUTH DAILY   Oxervate 0.002 % Soln Generic drug: Cenegermin-bkbj   potassium chloride SA 20 MEQ tablet Commonly known as: KLOR-CON M TAKE 1 TABLET BY MOUTH DAILY   prochlorperazine 10 MG tablet Commonly known as: COMPAZINE TAKE 1 TABLET(10 MG) BY MOUTH EVERY 6 HOURS AS NEEDED FOR  NAUSEA OR VOMITING   REFRESH DRY EYE THERAPY OP Apply to eye 3 (three) times daily.   sertraline 50 MG tablet Commonly known as: ZOLOFT One half tab for 5 days then one tab daily for anxiety and depression   traMADol 50 MG tablet Commonly known as: ULTRAM Take 1 tablet by mouth 3 (three) times daily as needed.   triamcinolone 0.1 % cream : eucerin Crea triamcinolone cr. 0.1%/eucerin cr.  APPLY TO SKIN TWICE DAILY FOR ITCHING   Vitamin B-12 5000 MCG Tbdp Take by mouth daily.   Vitamin D 50 MCG (2000 UT) Caps Take 2,000 Units by mouth daily.   Voltaren 1 % Gel Generic drug: diclofenac Sodium APPLY 2 GRAMS TO THE AFFECTED AREA(S) BY TOPICAL ROUTE 4 TIMES PER DAY   Xiidra 5 % Soln Generic drug: Lifitegrast Place 1 drop into both eyes 2 (two) times daily.   zolpidem 10 MG tablet Commonly known as: AMBIEN TAKE 1 TABLET(10 MG) BY MOUTH AT BEDTIME        Allergies:  Allergies  Allergen Reactions   Crestor [Rosuvastatin Calcium] Other (See Comments)    Causes liver functions to be elevated    Past Medical History, Surgical history, Social history, and Family History were reviewed and updated.  Review of Systems: Review of Systems  Constitutional:  Positive for malaise/fatigue.  HENT: Negative.    Eyes: Negative.   Respiratory: Negative.    Cardiovascular: Negative.   Gastrointestinal: Negative.   Genitourinary: Negative.   Musculoskeletal:  Positive for joint pain, myalgias and neck pain.  Skin: Negative.   Neurological: Negative.   Endo/Heme/Allergies: Negative.   Psychiatric/Behavioral: Negative.       Physical Exam:  height is _0  (1.499 m) and weight is 100 lb 12.8 oz (45.7 kg). Her oral temperature is 97.9 F (36.6 C). Her blood pressure is 126/65 and her pulse is 88. Her respiration is 20 and oxygen saturation is 100%.   Wt Readings from Last 3 Encounters:  07/24/22 100 lb 12.8 oz (45.7 kg)  07/02/22 100 lb 1.9 oz (45.4 kg)  06/26/22 100 lb  (45.4 kg)    Physical Exam Vitals reviewed.  Constitutional:      Comments: This is a petite white female.  She has some kyphosis.  She is alert and oriented x 3.  HENT:     Head: Normocephalic and atraumatic.  Eyes:     Pupils: Pupils are equal, round, and reactive to light.  Cardiovascular:     Rate and Rhythm: Normal rate and regular rhythm.     Heart sounds: Normal heart sounds.  Pulmonary:     Effort: Pulmonary effort is  normal.     Breath sounds: Normal breath sounds.  Abdominal:     General: Bowel sounds are normal.     Palpations: Abdomen is soft.  Musculoskeletal:        General: No tenderness or deformity. Normal range of motion.     Cervical back: Normal range of motion.     Comments: Back exam does show some kyphosis.  Lymphadenopathy:     Cervical: No cervical adenopathy.  Skin:    General: Skin is warm and dry.     Findings: No erythema or rash.  Neurological:     Mental Status: She is alert and oriented to person, place, and time.  Psychiatric:        Behavior: Behavior normal.        Thought Content: Thought content normal.        Judgment: Judgment normal.    Lab Results  Component Value Date   WBC 6.7 07/24/2022   HGB 13.1 07/24/2022   HCT 40.0 07/24/2022   MCV 102.0 (H) 07/24/2022   PLT 252 07/24/2022   Lab Results  Component Value Date   FERRITIN 258 10/25/2020   IRON 136 10/25/2020   TIBC 353 10/25/2020   UIBC 216 10/25/2020   IRONPCTSAT 39 10/25/2020   Lab Results  Component Value Date   RBC 3.92 07/24/2022   Lab Results  Component Value Date   KPAFRELGTCHN 47.3 (H) 06/26/2022   LAMBDASER <1.5 (L) 06/26/2022   KAPLAMBRATIO >31.53 (H) 06/26/2022   Lab Results  Component Value Date   IGGSERUM 938 06/26/2022   IGA 7 (L) 06/26/2022   IGMSERUM 15 (L) 06/26/2022   Lab Results  Component Value Date   TOTALPROTELP 6.5 06/26/2022   ALBUMINELP 3.6 06/26/2022   A1GS 0.3 06/26/2022   A2GS 0.8 06/26/2022   BETS 1.0 06/26/2022   GAMS  0.9 06/26/2022   MSPIKE 0.7 (H) 06/26/2022   SPEI Comment 05/01/2022     Chemistry      Component Value Date/Time   NA 140 07/24/2022 0828   NA 148 (H) 07/16/2017 1128   NA 143 03/05/2017 1051   K 4.2 07/24/2022 0828   K 3.8 07/16/2017 1128   K 3.7 03/05/2017 1051   CL 105 07/24/2022 0828   CL 106 07/16/2017 1128   CO2 27 07/24/2022 0828   CO2 29 07/16/2017 1128   CO2 25 03/05/2017 1051   BUN 20 07/24/2022 0828   BUN 23 (H) 07/16/2017 1128   BUN 18.2 03/05/2017 1051   CREATININE 0.67 07/24/2022 0828   CREATININE 0.86 06/28/2022 1056   CREATININE 0.8 03/05/2017 1051      Component Value Date/Time   CALCIUM 9.7 07/24/2022 0828   CALCIUM 9.7 07/16/2017 1128   CALCIUM 8.9 03/05/2017 1051   ALKPHOS 66 07/24/2022 0828   ALKPHOS 71 07/16/2017 1128   ALKPHOS 67 03/05/2017 1051   AST 13 (L) 07/24/2022 0828   AST 15 03/05/2017 1051   ALT 11 07/24/2022 0828   ALT 23 07/16/2017 1128   ALT 17 03/05/2017 1051   BILITOT 0.4 07/24/2022 0828   BILITOT 0.55 03/05/2017 1051       Impression and Plan: Debra Ruiz is a very pleasant 77 yo caucasian female with IgG Kappa Myeloma.   I know is been very grueling on her.  I know there is been a lot of issues with her own health and with her husband's dementia.  I really feel bad for her.  I know she is  trying her best.  We will have to see what her myeloma studies look like.  We will go ahead with treatment today.  I know that she has a hard time getting here on a weekly basis.  Will try to make treatment amenable to her schedule.  I know we are not going to get her into remission but if we can release keep her myeloma at a low level, then she should be able to do okay. s.   [name], MD 1/3/20249:18 AM

## 2022-07-24 NOTE — Patient Instructions (Signed)
[facility]  Discharge Instructions: Thank you for choosing [facility] to provide your oncology and hematology care.   If you have a lab appointment with the [facility], please go directly to the [facility] and check in at the registration area.  Wear comfortable clothing and clothing appropriate for easy access to any Portacath or PICC line.   We strive to give you quality time with your provider. You may need to reschedule your appointment if you arrive late (15 or more minutes).  Arriving late affects you and other patients whose appointments are after yours.  Also, if you miss three or more appointments without notifying the office, you may be dismissed from the clinic at the provider's discretion.      For prescription refill requests, have your pharmacy contact our office and allow 72 hours for refills to be completed.    Today you received the following chemotherapy and/or immunotherapy agents Kyprolis, Darzalex      To help prevent nausea and vomiting after your treatment, we encourage you to take your nausea medication as directed.  BELOW ARE SYMPTOMS THAT SHOULD BE REPORTED IMMEDIATELY: *FEVER GREATER THAN 100.4 F (38 C) OR HIGHER *CHILLS OR SWEATING *NAUSEA AND VOMITING THAT IS NOT CONTROLLED WITH YOUR NAUSEA MEDICATION *UNUSUAL SHORTNESS OF BREATH *UNUSUAL BRUISING OR BLEEDING *URINARY PROBLEMS (pain or burning when urinating, or frequent urination) *BOWEL PROBLEMS (unusual diarrhea, constipation, pain near the anus) TENDERNESS IN MOUTH AND THROAT WITH OR WITHOUT PRESENCE OF ULCERS (sore throat, sores in mouth, or a toothache) UNUSUAL RASH, SWELLING OR PAIN  UNUSUAL VAGINAL DISCHARGE OR ITCHING   Items with * indicate a potential emergency and should be followed up as soon as possible or go to the Emergency Department if any problems should occur.  Please show the CHEMOTHERAPY ALERT CARD or IMMUNOTHERAPY ALERT CARD at check-in  to the Emergency Department and triage nurse. Should you have questions after your visit or need to cancel or reschedule your appointment, please contact [facility]  [phone] and follow the prompts.  Office hours are 8:00 a.m. to 4:30 p.m. Monday - Friday. Please note that voicemails left after 4:00 p.m. may not be returned until the following business day.  We are closed weekends and major holidays. You have access to a nurse at all times for urgent questions. Please call the main number to the clinic [phone] and follow the prompts.  For any non-urgent questions, you may also contact your provider using MyChart. We now offer e-Visits for anyone 30 and older to request care online for non-urgent symptoms. For details visit [email or web address].   Also download the MyChart app! Go to the app store, search "MyChart", open the app, select [facility], and log in with your MyChart username and password.

## 2022-07-25 ENCOUNTER — Other Ambulatory Visit: Payer: Self-pay

## 2022-07-25 LAB — IGG, IGA, IGM
IgA: <5 mg/dL — ABNORMAL LOW (ref 64–422)
IgG (Immunoglobin G), Serum: 1009 mg/dL (ref 586–1602)
IgM (Immunoglobulin M), Srm: 19 mg/dL — ABNORMAL LOW (ref 26–217)

## 2022-07-25 LAB — KAPPA/LAMBDA LIGHT CHAINS
Kappa free light chain: 102.5 mg/L — ABNORMAL HIGH (ref 3.3–19.4)
Kappa, lambda light chain ratio: >68.33 — ABNORMAL HIGH (ref 0.26–1.65)
Lambda free light chains: <1.5 mg/L — ABNORMAL LOW (ref 5.7–26.3)

## 2022-07-26 LAB — PROTEIN ELECTROPHORESIS, SERUM
A/G Ratio: 1.2 (ref 0.7–1.7)
Albumin ELP: 3.5 g/dL (ref 2.9–4.4)
Alpha-1-Globulin: 0.3 g/dL (ref 0.0–0.4)
Alpha-2-Globulin: 0.9 g/dL (ref 0.4–1.0)
Beta Globulin: 0.9 g/dL (ref 0.7–1.3)
Gamma Globulin: 0.8 g/dL (ref 0.4–1.8)
Globulin, Total: 2.9 g/dL (ref 2.2–3.9)
M-Spike, %: 0.7 g/dL — ABNORMAL HIGH
Total Protein ELP: 6.4 g/dL (ref 6.0–8.5)

## 2022-07-29 ENCOUNTER — Other Ambulatory Visit: Payer: Self-pay | Admitting: *Deleted

## 2022-07-29 ENCOUNTER — Telehealth: Payer: Self-pay | Admitting: *Deleted

## 2022-07-29 MED ORDER — AMOXICILLIN 500 MG PO TABS: 500.0000 mg | ORAL_TABLET | Freq: Two times a day (BID) | ORAL | 0 refills | Status: DC

## 2022-07-29 NOTE — Telephone Encounter (Signed)
Debra Ruiz called inquiring whether her antibiotic was called in by Dr Marin Olp last week for an infected tooth.  This was not called in.  Dr Marin Olp wants patient to get Amoxicillin 500 bid for 1 week. RX sent to her requested pharmacy

## 2022-08-06 DIAGNOSIS — Z1231 Encounter for screening mammogram for malignant neoplasm of breast: Secondary | ICD-10-CM | POA: Diagnosis not present

## 2022-08-06 LAB — HM MAMMOGRAPHY

## 2022-08-08 ENCOUNTER — Encounter: Payer: Self-pay | Admitting: Internal Medicine

## 2022-08-16 ENCOUNTER — Telehealth: Payer: Self-pay | Admitting: Internal Medicine

## 2022-08-16 ENCOUNTER — Ambulatory Visit (INDEPENDENT_AMBULATORY_CARE_PROVIDER_SITE_OTHER): Payer: PPO | Admitting: Internal Medicine

## 2022-08-16 ENCOUNTER — Encounter: Payer: Self-pay | Admitting: Internal Medicine

## 2022-08-16 VITALS — BP 100/60 | HR 89 | Temp 98.7°F | Ht 59.0 in | Wt 102.8 lb

## 2022-08-16 DIAGNOSIS — N390 Urinary tract infection, site not specified: Secondary | ICD-10-CM

## 2022-08-16 DIAGNOSIS — R82998 Other abnormal findings in urine: Secondary | ICD-10-CM | POA: Diagnosis not present

## 2022-08-16 DIAGNOSIS — N949 Unspecified condition associated with female genital organs and menstrual cycle: Secondary | ICD-10-CM

## 2022-08-16 LAB — POCT URINALYSIS DIPSTICK
Glucose, UA: NEGATIVE
Ketones, UA: NEGATIVE
Protein, UA: POSITIVE — AB
Spec Grav, UA: >=1.03 — AB (ref 1.010–1.025)
Urobilinogen, UA: 0.2 E.U./dL
pH, UA: 5 (ref 5.0–8.0)

## 2022-08-16 MED ORDER — CIPROFLOXACIN HCL 250 MG PO TABS: 250.0000 mg | ORAL_TABLET | Freq: Two times a day (BID) | ORAL | 0 refills | Status: AC

## 2022-08-16 NOTE — Telephone Encounter (Signed)
Debra Ruiz [phone]  [name] called to say she thinks she has another UTI, she feels burning when she goes to bathroom, urgency. Wanted to wait till Monday, I let her know she could be in trouble by Monday and scheduled her for 4:00 this afternoon.

## 2022-08-16 NOTE — Patient Instructions (Addendum)
You have been diagnosed with a urinary tract infection.  Culture is pending.  Please take Cipro 250 mg tablets by mouth twice daily for 7 days.

## 2022-08-16 NOTE — Progress Notes (Signed)
   Subjective:    Patient ID: Debra Ruiz, female    DOB: 19-May-1946, 77 y.o.   MRN: [id number]  HPI  History of 3-4 days of urinary frequency, urgency, incontinence of urine, dysuria. She had E.coli UTI April 30, 2022.  She has a history of multiple myeloma treated by Dr. Burney Gauze, Oncologist.  She has a history of anxiety and situational stress.  Urine dipstick today shows specific gravity greater than 1.030 and trace LE.  There is large nitrite present.  Occult blood is present.  Culture was sent.    Review of Systems no vomiting, diaphoresis, or shaking chills     Objective:   Physical Exam she is seen in the exam room and is slightly anxious.   Temp 98.7 degrees, BP 100/60, respiratory rate normal    Weight 102 lb  12.8 oz  She has no CVA tenderness    Assessment & Plan:   Acute urinary tract infection-urine dipstick shows trace LE and large nitrite.  Urine culture is pending.  History of multiple myeloma treated by Dr. Marin Olp  Essential hypertension treated with amlodipine  Anxiety state treated with Xanax  Depression treated with Wellbutrin  Hyperlipidemia treated with atorvastatin  Plan: Cipro 250 mg tablets by mouth twice daily for 7 days pending culture results.  Urine culture is pending.

## 2022-08-18 LAB — URINE CULTURE
MICRO NUMBER:: 14479014
SPECIMEN QUALITY:: ADEQUATE

## 2022-08-21 ENCOUNTER — Inpatient Hospital Stay (HOSPITAL_BASED_OUTPATIENT_CLINIC_OR_DEPARTMENT_OTHER): Payer: PPO | Admitting: Hematology & Oncology

## 2022-08-21 ENCOUNTER — Inpatient Hospital Stay: Payer: PPO

## 2022-08-21 ENCOUNTER — Encounter: Payer: Self-pay | Admitting: Hematology & Oncology

## 2022-08-21 VITALS — BP 135/69 | HR 75 | Resp 17

## 2022-08-21 VITALS — BP 147/64 | HR 78 | Temp 98.0°F | Resp 20 | Ht 59.0 in | Wt 102.0 lb

## 2022-08-21 DIAGNOSIS — C9000 Multiple myeloma not having achieved remission: Secondary | ICD-10-CM

## 2022-08-21 DIAGNOSIS — Z5112 Encounter for antineoplastic immunotherapy: Secondary | ICD-10-CM | POA: Diagnosis not present

## 2022-08-21 LAB — CBC WITH DIFFERENTIAL (CANCER CENTER ONLY)
Abs Immature Granulocytes: 0.04 10*3/uL (ref 0.00–0.07)
Basophils Absolute: 0 10*3/uL (ref 0.0–0.1)
Basophils Relative: 0 %
Eosinophils Absolute: 0.1 10*3/uL (ref 0.0–0.5)
Eosinophils Relative: 2 %
HCT: 37.3 % (ref 36.0–46.0)
Hemoglobin: 12.3 g/dL (ref 12.0–15.0)
Immature Granulocytes: 1 %
Lymphocytes Relative: 22 %
Lymphs Abs: 1.3 10*3/uL (ref 0.7–4.0)
MCH: 34 pg (ref 26.0–34.0)
MCHC: 33 g/dL (ref 30.0–36.0)
MCV: 103 fL — ABNORMAL HIGH (ref 80.0–100.0)
Monocytes Absolute: 1 10*3/uL (ref 0.1–1.0)
Monocytes Relative: 17 %
Neutro Abs: 3.5 10*3/uL (ref 1.7–7.7)
Neutrophils Relative %: 58 %
Platelet Count: 240 10*3/uL (ref 150–400)
RBC: 3.62 MIL/uL — ABNORMAL LOW (ref 3.87–5.11)
RDW: 14.1 % (ref 11.5–15.5)
WBC Count: 6.1 10*3/uL (ref 4.0–10.5)
nRBC: 0 % (ref 0.0–0.2)

## 2022-08-21 LAB — CMP (CANCER CENTER ONLY)
ALT: 16 U/L (ref 0–44)
AST: 15 U/L (ref 15–41)
Albumin: 4.1 g/dL (ref 3.5–5.0)
Alkaline Phosphatase: 56 U/L (ref 38–126)
Anion gap: 8 (ref 5–15)
BUN: 28 mg/dL — ABNORMAL HIGH (ref 8–23)
CO2: 25 mmol/L (ref 22–32)
Calcium: 10 mg/dL (ref 8.9–10.3)
Chloride: 107 mmol/L (ref 98–111)
Creatinine: 0.64 mg/dL (ref 0.44–1.00)
GFR, Estimated: >60 mL/min (ref >60)
Glucose, Bld: 91 mg/dL (ref 70–99)
Potassium: 3.9 mmol/L (ref 3.5–5.1)
Sodium: 140 mmol/L (ref 135–145)
Total Bilirubin: 0.3 mg/dL (ref 0.3–1.2)
Total Protein: 6.6 g/dL (ref 6.5–8.1)

## 2022-08-21 LAB — LACTATE DEHYDROGENASE: LDH: 189 U/L (ref 98–192)

## 2022-08-21 MED ORDER — SODIUM CHLORIDE 0.9 % IV SOLN
20.0000 mg | Freq: Once | INTRAVENOUS | Status: AC
  Administered 2022-08-21: 20 mg via INTRAVENOUS
  Filled 2022-08-21: qty 20

## 2022-08-21 MED ORDER — SODIUM CHLORIDE 0.9 % IV SOLN: Freq: Once | INTRAVENOUS | Status: AC

## 2022-08-21 MED ORDER — ACETAMINOPHEN 325 MG PO TABS: 650.0000 mg | ORAL_TABLET | Freq: Once | ORAL | Status: DC

## 2022-08-21 MED ORDER — DIPHENHYDRAMINE HCL 25 MG PO CAPS: 25.0000 mg | ORAL_CAPSULE | Freq: Once | ORAL | Status: DC

## 2022-08-21 MED ORDER — PROCHLORPERAZINE MALEATE 10 MG PO TABS: 10.0000 mg | ORAL_TABLET | Freq: Once | ORAL | Status: DC

## 2022-08-21 MED ORDER — KETOROLAC TROMETHAMINE 15 MG/ML IJ SOLN
30.0000 mg | Freq: Once | INTRAMUSCULAR | Status: DC
  Filled 2022-08-21: qty 2

## 2022-08-21 MED ORDER — DARATUMUMAB-HYALURONIDASE-FIHJ 1800-30000 MG-UT/15ML ~~LOC~~ SOLN
1800.0000 mg | Freq: Once | SUBCUTANEOUS | Status: AC
  Administered 2022-08-21: 1800 mg via SUBCUTANEOUS
  Filled 2022-08-21: qty 15

## 2022-08-21 MED ORDER — KETOROLAC TROMETHAMINE 15 MG/ML IJ SOLN
30.0000 mg | Freq: Once | INTRAMUSCULAR | Status: AC
  Administered 2022-08-21: 30 mg via INTRAVENOUS
  Filled 2022-08-21: qty 2

## 2022-08-21 MED ORDER — DEXTROSE 5 % IV SOLN
70.0000 mg/m2 | Freq: Once | INTRAVENOUS | Status: AC
  Administered 2022-08-21: 100 mg via INTRAVENOUS
  Filled 2022-08-21: qty 30

## 2022-08-21 NOTE — Progress Notes (Signed)
Hematology and Oncology Follow Up Visit  GIANNA CALEF [id number] [birth year] 77 y.o. 08/21/2022   Principle Diagnosis:  IgG Kappa myeloma - compression fractures - 13q-, +11, 14q+ - hyperdiploid -- progressive Iron def anemia   Completed Therapy: S/p Kyphoplasty of T11 and T12 XRT to lower spine-completed on 09/25/2016 Ninlaro 2.3 mg po q week (3 on/1 off) -- start on 01/24/2020 -- d/c on 03/29/2020 due to toxicity Velcade/Pomalyst/Decadron -Start on 09/02/2018 --Pomalyst on hold since 11/09/2018.  D/C on 01/17/2020   Current Therapy:        Faspro -- start on 04/05/2020 -- s/p cycle #29 -- Kyprolis added on 07/20/2020 -- s/p cycle #25 Xgeva '120mg'$  Sq q  3 month - next dose in 08/2022  IV iron as indicated  -Injectafer given on 12/15/2018   Interim History:  Ms. Delrosario is here today for follow-up and treatment.  She is not doing well.  She is having quite a few problems.  He is worried about her medications.  I tried to straighten these out for her.  These medications are ones that we really did not give her.  She is having problems with arthritis.  She is having some neck problems.  She is all worried about her husband.  He has dementia.  This might be holding relatively stable right now.  After her last treatment, she says she had diarrhea and vomiting.  She has never had this before.  It only sound like she may have had a viral syndrome.  Her last myeloma studies show that her M spike was stable at 0.7 g/dL.  Her IgG level was 1000 mg/dL.  The Kappa light chain was much higher though add 10.2 mg/dL.  She has had no fever.  She has had no problem with infections.  I am unsure if she is sleeping all that well..  She has a lot of anxiety.  A lot of this is from her husband's health issues.  Overall, I would say that her performance status is probably ECOG 2, at best.    Medications:  Allergies as of 08/21/2022       Reactions   Crestor [rosuvastatin Calcium] Other (See  Comments)   Causes liver functions to be elevated        Medication List        Accurate as of August 21, 2022  9:30 AM. If you have any questions, ask your nurse or doctor.          ALPRAZolam 0.25 MG tablet Commonly known as: Xanax Take 1 tablet (0.25 mg total) by mouth 2 (two) times daily as needed for anxiety.   amLODipine 5 MG tablet Commonly known as: NORVASC TAKE 1 TABLET(5 MG) BY MOUTH DAILY   aspirin EC 81 MG tablet Take 81 mg by mouth every other day.   atorvastatin 20 MG tablet Commonly known as: LIPITOR TAKE 1 TABLET(20 MG) BY MOUTH DAILY   buPROPion 300 MG 24 hr tablet Commonly known as: WELLBUTRIN XL TAKE 1 TABLET(300 MG) BY MOUTH DAILY   celecoxib 50 MG capsule Commonly known as: CeleBREX Take 1 capsule (50 mg total) by mouth 2 (two) times daily. You MUST take with food!!!   ciprofloxacin 250 MG tablet Commonly known as: Cipro Take 1 tablet (250 mg total) by mouth 2 (two) times daily for 7 days.   dexamethasone 4 MG tablet Commonly known as: DECADRON TAKE 3 TABLETS BY MOUTH 1 TIME A WEEK   dronabinol 2.5 MG capsule Commonly known  as: MARINOL Take 1 capsule (2.5 mg total) by mouth 2 (two) times daily before a meal.   famciclovir 250 MG tablet Commonly known as: FAMVIR TAKE 1 TABLET(250 MG) BY MOUTH DAILY   Flarex 0.1 % ophthalmic suspension Generic drug: fluorometholone Place 1 drop into both eyes 2 (two) times daily.   fluorometholone 0.1 % ophthalmic suspension Commonly known as: EFLONE Place 1 drop into both eyes 4 (four) times daily.   fluorometholone 0.1 % ophthalmic suspension Commonly known as: FML 1 drop daily.   mirabegron ER 25 MG Tb24 tablet Commonly known as: Myrbetriq Take 1 tablet (25 mg total) by mouth daily.   naloxone 4 MG/0.1ML Liqd nasal spray kit Commonly known as: NARCAN   nebivolol 5 MG tablet Commonly known as: Bystolic TAKE 1 TABLET(5 MG) BY MOUTH DAILY   Oxervate 0.002 % Soln Generic drug:  Cenegermin-bkbj   potassium chloride SA 20 MEQ tablet Commonly known as: KLOR-CON M TAKE 1 TABLET BY MOUTH DAILY   prochlorperazine 10 MG tablet Commonly known as: COMPAZINE TAKE 1 TABLET(10 MG) BY MOUTH EVERY 6 HOURS AS NEEDED FOR NAUSEA OR VOMITING   REFRESH DRY EYE THERAPY OP Apply to eye 3 (three) times daily.   sertraline 50 MG tablet Commonly known as: ZOLOFT One half tab for 5 days then one tab daily for anxiety and depression   traMADol 50 MG tablet Commonly known as: ULTRAM Take 1 tablet by mouth 3 (three) times daily as needed.   triamcinolone 0.1 % cream : eucerin Crea triamcinolone cr. 0.1%/eucerin cr.  APPLY TO SKIN TWICE DAILY FOR ITCHING   Vitamin B-12 5000 MCG Tbdp Take by mouth daily.   Vitamin D 50 MCG (2000 UT) Caps Take 2,000 Units by mouth daily.   Voltaren 1 % Gel Generic drug: diclofenac Sodium APPLY 2 GRAMS TO THE AFFECTED AREA(S) BY TOPICAL ROUTE 4 TIMES PER DAY   Xiidra 5 % Soln Generic drug: Lifitegrast Place 1 drop into both eyes 2 (two) times daily.   zolpidem 10 MG tablet Commonly known as: AMBIEN TAKE 1 TABLET(10 MG) BY MOUTH AT BEDTIME        Allergies:  Allergies  Allergen Reactions   Crestor [Rosuvastatin Calcium] Other (See Comments)    Causes liver functions to be elevated    Past Medical History, Surgical history, Social history, and Family History were reviewed and updated.  Review of Systems: Review of Systems  Constitutional:  Positive for malaise/fatigue.  HENT: Negative.    Eyes: Negative.   Respiratory: Negative.    Cardiovascular: Negative.   Gastrointestinal: Negative.   Genitourinary: Negative.   Musculoskeletal:  Positive for joint pain, myalgias and neck pain.  Skin: Negative.   Neurological: Negative.   Endo/Heme/Allergies: Negative.   Psychiatric/Behavioral: Negative.       Physical Exam:  height is '4\' 11"'$  (1.499 m) and weight is 102 lb (46.3 kg). Her oral temperature is 98 F (36.7 C). Her  blood pressure is 147/64 (abnormal) and her pulse is 78. Her respiration is 20 and oxygen saturation is 100%.   Wt Readings from Last 3 Encounters:  08/21/22 102 lb (46.3 kg)  08/16/22 102 lb 12.8 oz (46.6 kg)  07/24/22 100 lb 12.8 oz (45.7 kg)    Physical Exam Vitals reviewed.  Constitutional:      Comments: This is a petite white female.  She has some kyphosis.  She is alert and oriented x 3.  HENT:     Head: Normocephalic and atraumatic.  Eyes:  Pupils: Pupils are equal, round, and reactive to light.  Cardiovascular:     Rate and Rhythm: Normal rate and regular rhythm.     Heart sounds: Normal heart sounds.  Pulmonary:     Effort: Pulmonary effort is normal.     Breath sounds: Normal breath sounds.  Abdominal:     General: Bowel sounds are normal.     Palpations: Abdomen is soft.  Musculoskeletal:        General: No tenderness or deformity. Normal range of motion.     Cervical back: Normal range of motion.     Comments: Back exam does show some kyphosis.  Lymphadenopathy:     Cervical: No cervical adenopathy.  Skin:    General: Skin is warm and dry.     Findings: No erythema or rash.  Neurological:     Mental Status: She is alert and oriented to person, place, and time.  Psychiatric:        Behavior: Behavior normal.        Thought Content: Thought content normal.        Judgment: Judgment normal.    Lab Results  Component Value Date   WBC 6.1 08/21/2022   HGB 12.3 08/21/2022   HCT 37.3 08/21/2022   MCV 103.0 (H) 08/21/2022   PLT 240 08/21/2022   Lab Results  Component Value Date   FERRITIN 258 10/25/2020   IRON 136 10/25/2020   TIBC 353 10/25/2020   UIBC 216 10/25/2020   IRONPCTSAT 39 10/25/2020   Lab Results  Component Value Date   RBC 3.62 (L) 08/21/2022   Lab Results  Component Value Date   KPAFRELGTCHN 102.5 (H) 07/24/2022   LAMBDASER <1.5 (L) 07/24/2022   KAPLAMBRATIO >68.33 (H) 07/24/2022   Lab Results  Component Value Date   IGGSERUM  1,009 07/24/2022   IGA <5 (L) 07/24/2022   IGMSERUM 19 (L) 07/24/2022   Lab Results  Component Value Date   TOTALPROTELP 6.4 07/24/2022   ALBUMINELP 3.5 07/24/2022   A1GS 0.3 07/24/2022   A2GS 0.9 07/24/2022   BETS 0.9 07/24/2022   GAMS 0.8 07/24/2022   MSPIKE 0.7 (H) 07/24/2022   SPEI Comment 07/24/2022     Chemistry      Component Value Date/Time   NA 140 08/21/2022 0829   NA 148 (H) 07/16/2017 1128   NA 143 03/05/2017 1051   K 3.9 08/21/2022 0829   K 3.8 07/16/2017 1128   K 3.7 03/05/2017 1051   CL 107 08/21/2022 0829   CL 106 07/16/2017 1128   CO2 25 08/21/2022 0829   CO2 29 07/16/2017 1128   CO2 25 03/05/2017 1051   BUN 28 (H) 08/21/2022 0829   BUN 23 (H) 07/16/2017 1128   BUN 18.2 03/05/2017 1051   CREATININE 0.64 08/21/2022 0829   CREATININE 0.86 06/28/2022 1056   CREATININE 0.8 03/05/2017 1051      Component Value Date/Time   CALCIUM 10.0 08/21/2022 0829   CALCIUM 9.7 07/16/2017 1128   CALCIUM 8.9 03/05/2017 1051   ALKPHOS 56 08/21/2022 0829   ALKPHOS 71 07/16/2017 1128   ALKPHOS 67 03/05/2017 1051   AST 15 08/21/2022 0829   AST 15 03/05/2017 1051   ALT 16 08/21/2022 0829   ALT 23 07/16/2017 1128   ALT 17 03/05/2017 1051   BILITOT 0.3 08/21/2022 0829   BILITOT 0.55 03/05/2017 1051       Impression and Plan: Ms. Whiteaker is a very pleasant 77 yo caucasian female with IgG  Kappa Myeloma.   I know is been very grueling on her.  I know there is been a lot of issues with her own health and with her husband's dementia.  I really feel bad for her.  I know she is trying her best.  We will have to see what her myeloma studies look like.  We will go ahead with treatment today.    If we find that the myeloma levels are higher, particularly the Kappa light chain, we clearly will have to make an adjustment with her protocol.  We may have to change protocols.  I suppose we could try Xpovio since this is oral.  Know it is hard for her to get here more  often.  We could change out the Faspro and maybe try elotuzimab.  Another possibility would be Sarclisa.  Again, I want to try to make things as easy as possible for her yet effective.  For right now, we will just plan to get her back in 1 month.  We can always change this if we find that her myeloma levels have increased.    [name], MD 1/31/20249:30 AM

## 2022-08-21 NOTE — Patient Instructions (Signed)
[facility]  Discharge Instructions: Thank you for choosing [facility] to provide your oncology and hematology care.   If you have a lab appointment with the [facility], please go directly to the [facility] and check in at the registration area.  Wear comfortable clothing and clothing appropriate for easy access to any Portacath or PICC line.   We strive to give you quality time with your provider. You may need to reschedule your appointment if you arrive late (15 or more minutes).  Arriving late affects you and other patients whose appointments are after yours.  Also, if you miss three or more appointments without notifying the office, you may be dismissed from the clinic at the provider's discretion.      For prescription refill requests, have your pharmacy contact our office and allow 72 hours for refills to be completed.    Today you received the following chemotherapy and/or immunotherapy agents Kyprolis, Darzalex      To help prevent nausea and vomiting after your treatment, we encourage you to take your nausea medication as directed.  BELOW ARE SYMPTOMS THAT SHOULD BE REPORTED IMMEDIATELY: *FEVER GREATER THAN 100.4 F (38 C) OR HIGHER *CHILLS OR SWEATING *NAUSEA AND VOMITING THAT IS NOT CONTROLLED WITH YOUR NAUSEA MEDICATION *UNUSUAL SHORTNESS OF BREATH *UNUSUAL BRUISING OR BLEEDING *URINARY PROBLEMS (pain or burning when urinating, or frequent urination) *BOWEL PROBLEMS (unusual diarrhea, constipation, pain near the anus) TENDERNESS IN MOUTH AND THROAT WITH OR WITHOUT PRESENCE OF ULCERS (sore throat, sores in mouth, or a toothache) UNUSUAL RASH, SWELLING OR PAIN  UNUSUAL VAGINAL DISCHARGE OR ITCHING   Items with * indicate a potential emergency and should be followed up as soon as possible or go to the Emergency Department if any problems should occur.  Please show the CHEMOTHERAPY ALERT CARD or IMMUNOTHERAPY ALERT CARD  at check-in to the Emergency Department and triage nurse. Should you have questions after your visit or need to cancel or reschedule your appointment, please contact [facility]  [phone] and follow the prompts.  Office hours are 8:00 a.m. to 4:30 p.m. Monday - Friday. Please note that voicemails left after 4:00 p.m. may not be returned until the following business day.  We are closed weekends and major holidays. You have access to a nurse at all times for urgent questions. Please call the main number to the clinic [phone] and follow the prompts.  For any non-urgent questions, you may also contact your provider using MyChart. We now offer e-Visits for anyone 75 and older to request care online for non-urgent symptoms. For details visit [email or web address].   Also download the MyChart app! Go to the app store, search "MyChart", open the app, select [facility], and log in with your MyChart username and password.

## 2022-08-22 ENCOUNTER — Other Ambulatory Visit: Payer: Self-pay | Admitting: *Deleted

## 2022-08-22 LAB — KAPPA/LAMBDA LIGHT CHAINS
Kappa free light chain: 114.5 mg/L — ABNORMAL HIGH (ref 3.3–19.4)
Kappa, lambda light chain ratio: >76.33 — ABNORMAL HIGH (ref 0.26–1.65)
Lambda free light chains: <1.5 mg/L — ABNORMAL LOW (ref 5.7–26.3)

## 2022-08-22 MED ORDER — ZOLPIDEM TARTRATE 10 MG PO TABS: ORAL_TABLET | ORAL | 0 refills | Status: DC

## 2022-08-23 ENCOUNTER — Other Ambulatory Visit: Payer: Self-pay

## 2022-08-23 LAB — IGG, IGA, IGM
IgA: <5 mg/dL — ABNORMAL LOW (ref 64–422)
IgG (Immunoglobin G), Serum: 1241 mg/dL (ref 586–1602)
IgM (Immunoglobulin M), Srm: 9 mg/dL — ABNORMAL LOW (ref 26–217)

## 2022-08-26 ENCOUNTER — Ambulatory Visit (INDEPENDENT_AMBULATORY_CARE_PROVIDER_SITE_OTHER): Payer: PPO

## 2022-08-26 VITALS — BP 100/62 | HR 96 | Temp 98.7°F | Ht 59.0 in | Wt 102.0 lb

## 2022-08-26 DIAGNOSIS — Z0189 Encounter for other specified special examinations: Secondary | ICD-10-CM

## 2022-08-26 LAB — POCT URINALYSIS DIPSTICK
Bilirubin, UA: NEGATIVE
Blood, UA: NEGATIVE
Glucose, UA: NEGATIVE
Ketones, UA: NEGATIVE
Nitrite, UA: NEGATIVE
Protein, UA: NEGATIVE
Spec Grav, UA: 1.02 (ref 1.010–1.025)
Urobilinogen, UA: 0.2 E.U./dL
pH, UA: 5 (ref 5.0–8.0)

## 2022-08-26 NOTE — Progress Notes (Signed)
Patient was seen in office for a follow up on a UTI,  urine dipstick was performed with a trace of leukocytes. Patient was informed of results and given a clean urine container to take home and return it with a clean catch specimen.  Vital signs were within normal limits.

## 2022-08-26 NOTE — Progress Notes (Signed)
scheduled

## 2022-08-29 ENCOUNTER — Ambulatory Visit: Payer: PPO

## 2022-08-29 ENCOUNTER — Ambulatory Visit (INDEPENDENT_AMBULATORY_CARE_PROVIDER_SITE_OTHER): Payer: PPO

## 2022-08-29 VITALS — BP 100/62 | Ht 59.0 in | Wt 102.0 lb

## 2022-08-29 DIAGNOSIS — Z0189 Encounter for other specified special examinations: Secondary | ICD-10-CM | POA: Diagnosis not present

## 2022-08-29 LAB — POCT URINALYSIS DIPSTICK
Bilirubin, UA: NEGATIVE
Blood, UA: NEGATIVE
Glucose, UA: NEGATIVE
Ketones, UA: NEGATIVE
Leukocytes, UA: NEGATIVE
Nitrite, UA: NEGATIVE
Protein, UA: NEGATIVE
Spec Grav, UA: 1.015 (ref 1.010–1.025)
Urobilinogen, UA: 0.2 E.U./dL
pH, UA: 5 (ref 5.0–8.0)

## 2022-08-29 LAB — IMMUNOFIXATION REFLEX, SERUM
IgA: <5 mg/dL — ABNORMAL LOW (ref 64–422)
IgG (Immunoglobin G), Serum: 1306 mg/dL (ref 586–1602)
IgM (Immunoglobulin M), Srm: 11 mg/dL — ABNORMAL LOW (ref 26–217)

## 2022-08-29 LAB — PROTEIN ELECTROPHORESIS, SERUM, WITH REFLEX
A/G Ratio: 1.4 (ref 0.7–1.7)
Albumin ELP: 3.8 g/dL (ref 2.9–4.4)
Alpha-1-Globulin: 0.3 g/dL (ref 0.0–0.4)
Alpha-2-Globulin: 0.7 g/dL (ref 0.4–1.0)
Beta Globulin: 0.9 g/dL (ref 0.7–1.3)
Gamma Globulin: 1 g/dL (ref 0.4–1.8)
Globulin, Total: 2.8 g/dL (ref 2.2–3.9)
M-Spike, %: 0.9 g/dL — ABNORMAL HIGH
SPEP Interpretation: 0
Total Protein ELP: 6.6 g/dL (ref 6.0–8.5)

## 2022-08-29 NOTE — Progress Notes (Addendum)
Patient was seen in office for a follow up on a UTI,  urine dipstick was performed with negative results.

## 2022-09-14 ENCOUNTER — Other Ambulatory Visit: Payer: Self-pay | Admitting: Internal Medicine

## 2022-09-18 ENCOUNTER — Other Ambulatory Visit: Payer: Self-pay

## 2022-09-18 ENCOUNTER — Inpatient Hospital Stay: Payer: PPO | Attending: Hematology & Oncology

## 2022-09-18 ENCOUNTER — Inpatient Hospital Stay (HOSPITAL_BASED_OUTPATIENT_CLINIC_OR_DEPARTMENT_OTHER): Payer: PPO | Admitting: Hematology & Oncology

## 2022-09-18 ENCOUNTER — Ambulatory Visit (HOSPITAL_BASED_OUTPATIENT_CLINIC_OR_DEPARTMENT_OTHER)
Admission: RE | Admit: 2022-09-18 | Discharge: 2022-09-18 | Disposition: A | Discharge status: Home / Self Care | Payer: PPO | Source: Ambulatory Visit | Attending: Hematology & Oncology | Admitting: Hematology & Oncology

## 2022-09-18 ENCOUNTER — Inpatient Hospital Stay: Payer: PPO

## 2022-09-18 VITALS — BP 121/62 | HR 71

## 2022-09-18 VITALS — BP 147/62 | HR 85 | Temp 98.0°F | Resp 18 | Ht 59.0 in | Wt 102.0 lb

## 2022-09-18 DIAGNOSIS — Z79620 Long term (current) use of immunosuppressive biologic: Secondary | ICD-10-CM | POA: Insufficient documentation

## 2022-09-18 DIAGNOSIS — M542 Cervicalgia: Secondary | ICD-10-CM | POA: Diagnosis not present

## 2022-09-18 DIAGNOSIS — Z5112 Encounter for antineoplastic immunotherapy: Secondary | ICD-10-CM | POA: Diagnosis not present

## 2022-09-18 DIAGNOSIS — C9000 Multiple myeloma not having achieved remission: Secondary | ICD-10-CM

## 2022-09-18 DIAGNOSIS — D509 Iron deficiency anemia, unspecified: Secondary | ICD-10-CM | POA: Diagnosis not present

## 2022-09-18 DIAGNOSIS — M4312 Spondylolisthesis, cervical region: Secondary | ICD-10-CM | POA: Diagnosis not present

## 2022-09-18 LAB — CBC WITH DIFFERENTIAL (CANCER CENTER ONLY)
Abs Immature Granulocytes: 0.03 10*3/uL (ref 0.00–0.07)
Basophils Absolute: 0 10*3/uL (ref 0.0–0.1)
Basophils Relative: 0 %
Eosinophils Absolute: 0.1 10*3/uL (ref 0.0–0.5)
Eosinophils Relative: 1 %
HCT: 36.8 % (ref 36.0–46.0)
Hemoglobin: 12 g/dL (ref 12.0–15.0)
Immature Granulocytes: 1 %
Lymphocytes Relative: 14 %
Lymphs Abs: 0.9 10*3/uL (ref 0.7–4.0)
MCH: 33.8 pg (ref 26.0–34.0)
MCHC: 32.6 g/dL (ref 30.0–36.0)
MCV: 103.7 fL — ABNORMAL HIGH (ref 80.0–100.0)
Monocytes Absolute: 0.6 10*3/uL (ref 0.1–1.0)
Monocytes Relative: 9 %
Neutro Abs: 4.9 10*3/uL (ref 1.7–7.7)
Neutrophils Relative %: 75 %
Platelet Count: 203 10*3/uL (ref 150–400)
RBC: 3.55 MIL/uL — ABNORMAL LOW (ref 3.87–5.11)
RDW: 12.3 % (ref 11.5–15.5)
WBC Count: 6.4 10*3/uL (ref 4.0–10.5)
nRBC: 0 % (ref 0.0–0.2)

## 2022-09-18 LAB — CMP (CANCER CENTER ONLY)
ALT: 13 U/L (ref 0–44)
AST: 14 U/L — ABNORMAL LOW (ref 15–41)
Albumin: 4 g/dL (ref 3.5–5.0)
Alkaline Phosphatase: 58 U/L (ref 38–126)
Anion gap: 10 (ref 5–15)
BUN: 25 mg/dL — ABNORMAL HIGH (ref 8–23)
CO2: 24 mmol/L (ref 22–32)
Calcium: 9.6 mg/dL (ref 8.9–10.3)
Chloride: 105 mmol/L (ref 98–111)
Creatinine: 0.7 mg/dL (ref 0.44–1.00)
GFR, Estimated: >60 mL/min (ref >60)
Glucose, Bld: 172 mg/dL — ABNORMAL HIGH (ref 70–99)
Potassium: 3.8 mmol/L (ref 3.5–5.1)
Sodium: 139 mmol/L (ref 135–145)
Total Bilirubin: 0.3 mg/dL (ref 0.3–1.2)
Total Protein: 6.6 g/dL (ref 6.5–8.1)

## 2022-09-18 LAB — LACTATE DEHYDROGENASE: LDH: 182 U/L (ref 98–192)

## 2022-09-18 MED ORDER — SODIUM CHLORIDE 0.9 % IV SOLN: Freq: Once | INTRAVENOUS | Status: AC

## 2022-09-18 MED ORDER — DEXTROSE 5 % IV SOLN
70.0000 mg/m2 | Freq: Once | INTRAVENOUS | Status: AC
  Administered 2022-09-18: 100 mg via INTRAVENOUS
  Filled 2022-09-18: qty 30

## 2022-09-18 MED ORDER — DENOSUMAB 120 MG/1.7ML ~~LOC~~ SOLN
120.0000 mg | Freq: Once | SUBCUTANEOUS | Status: AC
  Administered 2022-09-18: 120 mg via SUBCUTANEOUS
  Filled 2022-09-18: qty 1.7

## 2022-09-18 MED ORDER — DIPHENHYDRAMINE HCL 25 MG PO CAPS: 25.0000 mg | ORAL_CAPSULE | Freq: Once | ORAL | Status: DC

## 2022-09-18 MED ORDER — DARATUMUMAB-HYALURONIDASE-FIHJ 1800-30000 MG-UT/15ML ~~LOC~~ SOLN
1800.0000 mg | Freq: Once | SUBCUTANEOUS | Status: AC
  Administered 2022-09-18: 1800 mg via SUBCUTANEOUS
  Filled 2022-09-18: qty 15

## 2022-09-18 MED ORDER — KETOROLAC TROMETHAMINE 15 MG/ML IJ SOLN
30.0000 mg | Freq: Once | INTRAMUSCULAR | Status: DC
  Filled 2022-09-18: qty 2

## 2022-09-18 MED ORDER — KETOROLAC TROMETHAMINE 15 MG/ML IJ SOLN
30.0000 mg | Freq: Once | INTRAMUSCULAR | Status: AC
  Administered 2022-09-18: 30 mg via INTRAVENOUS
  Filled 2022-09-18: qty 2

## 2022-09-18 MED ORDER — SODIUM CHLORIDE 0.9 % IV SOLN
20.0000 mg | Freq: Once | INTRAVENOUS | Status: AC
  Administered 2022-09-18: 20 mg via INTRAVENOUS
  Filled 2022-09-18: qty 20

## 2022-09-18 MED ORDER — HYDROMORPHONE HCL 1 MG/ML IJ SOLN
0.5000 mg | Freq: Once | INTRAMUSCULAR | Status: AC
  Administered 2022-09-18: 0.5 mg via INTRAVENOUS
  Filled 2022-09-18: qty 1

## 2022-09-18 MED ORDER — PROCHLORPERAZINE MALEATE 10 MG PO TABS: 10.0000 mg | ORAL_TABLET | Freq: Once | ORAL | Status: DC

## 2022-09-18 MED ORDER — HYDROMORPHONE HCL 1 MG/ML IJ SOLN
0.5000 mg | Freq: Once | INTRAMUSCULAR | Status: DC
  Filled 2022-09-18: qty 1

## 2022-09-18 MED ORDER — HYDROMORPHONE HCL 1 MG/ML IJ SOLN
0.5000 mg | Freq: Once | INTRAMUSCULAR | Status: AC
  Administered 2022-09-18: 0.5 mg via INTRAVENOUS

## 2022-09-18 MED ORDER — SODIUM CHLORIDE 0.9 % IV SOLN: Freq: Once | INTRAVENOUS | Status: DC

## 2022-09-18 MED ORDER — ACETAMINOPHEN 325 MG PO TABS: 650.0000 mg | ORAL_TABLET | Freq: Once | ORAL | Status: DC

## 2022-09-18 MED ORDER — OXYCODONE HCL 5 MG PO TABS: 5.0000 mg | ORAL_TABLET | Freq: Four times a day (QID) | ORAL | 0 refills | Status: DC | PRN

## 2022-09-18 NOTE — Progress Notes (Signed)
Hematology and Oncology Follow Up Visit  Debra Ruiz [id number] [birth year] 77 y.o. 09/18/2022   Principle Diagnosis:  IgG Kappa myeloma - compression fractures - 13q-, +11, 14q+ - hyperdiploid -- progressive Iron def anemia   Completed Therapy: S/p Kyphoplasty of T11 and T12 XRT to lower spine-completed on 09/25/2016 Ninlaro 2.3 mg po q week (3 on/1 off) -- start on 01/24/2020 -- d/c on 03/29/2020 due to toxicity Velcade/Pomalyst/Decadron -Start on 09/02/2018 --Pomalyst on hold since 11/09/2018.  D/C on 01/17/2020   Current Therapy:        Faspro -- start on 04/05/2020 -- s/p cycle #30 -- Kyprolis added on 07/20/2020 -- s/p cycle #26 Xgeva '120mg'$  Sq q  3 month - next dose in 08/2022  IV iron as indicated  -Injectafer given on 12/15/2018   Interim History:  Debra Ruiz is here today for follow-up and treatment.  She really is miserable today.  She did have a lot of neck problems.  The right side of her neck is hurting her so much.  I know she has bad arthritis.  However, given the fact that she has myeloma, we have to make sure that there is no myelomatous bone disease.  She says she has seen her orthopedic doctor.  She has seen the pain specialist with orthopedics.  I think she is been injected.  Nothing seems to be working.  As far as her myeloma is concerned, her kappa light chains are slowly going up still.  When we last saw her, the Kappa light chain was 11.4 mg/dL.  The M spike was 0.9 g/dL.  The IgG level was 1270 mg/dL.  It is clear that we may have to make a change.  I know she will get treated once a month.  We may have to think about treating her every week or so.  Then again, we may have to think about giving her a whole separate protocol.  I know that she is under a lot of stress at home.  Her husband has dementia which is progressing.  She has had no fever.  Her appetite is okay.  She has had no nausea or vomiting.  She has had no change in bowel or bladder  habits.  Currently, I would say that her performance status is probably ECOG 2.   Medications:  Allergies as of 09/18/2022       Reactions   Crestor [rosuvastatin Calcium] Other (See Comments)   Causes liver functions to be elevated        Medication List        Accurate as of September 18, 2022 10:15 AM. If you have any questions, ask your nurse or doctor.          ALPRAZolam 0.25 MG tablet Commonly known as: Xanax Take 1 tablet (0.25 mg total) by mouth 2 (two) times daily as needed for anxiety.   amLODipine 5 MG tablet Commonly known as: NORVASC TAKE 1 TABLET(5 MG) BY MOUTH DAILY   aspirin EC 81 MG tablet Take 81 mg by mouth every other day.   atorvastatin 20 MG tablet Commonly known as: LIPITOR TAKE 1 TABLET(20 MG) BY MOUTH DAILY   buPROPion 300 MG 24 hr tablet Commonly known as: WELLBUTRIN XL TAKE 1 TABLET(300 MG) BY MOUTH DAILY   celecoxib 50 MG capsule Commonly known as: CeleBREX Take 1 capsule (50 mg total) by mouth 2 (two) times daily. You MUST take with food!!!   dexamethasone 4 MG tablet Commonly known as: DECADRON  TAKE 3 TABLETS BY MOUTH 1 TIME A WEEK   dronabinol 2.5 MG capsule Commonly known as: MARINOL Take 1 capsule (2.5 mg total) by mouth 2 (two) times daily before a meal.   famciclovir 250 MG tablet Commonly known as: FAMVIR TAKE 1 TABLET(250 MG) BY MOUTH DAILY   Flarex 0.1 % ophthalmic suspension Generic drug: fluorometholone Place 1 drop into both eyes 2 (two) times daily.   fluorometholone 0.1 % ophthalmic suspension Commonly known as: EFLONE Place 1 drop into both eyes 4 (four) times daily.   fluorometholone 0.1 % ophthalmic suspension Commonly known as: FML 1 drop daily.   mirabegron ER 25 MG Tb24 tablet Commonly known as: Myrbetriq Take 1 tablet (25 mg total) by mouth daily.   naloxone 4 MG/0.1ML Liqd nasal spray kit Commonly known as: NARCAN   nebivolol 5 MG tablet Commonly known as: Bystolic TAKE 1 TABLET(5 MG) BY  MOUTH DAILY   Oxervate 0.002 % Soln Generic drug: Cenegermin-bkbj   potassium chloride SA 20 MEQ tablet Commonly known as: KLOR-CON M TAKE 1 TABLET BY MOUTH DAILY   prochlorperazine 10 MG tablet Commonly known as: COMPAZINE TAKE 1 TABLET(10 MG) BY MOUTH EVERY 6 HOURS AS NEEDED FOR NAUSEA OR VOMITING   REFRESH DRY EYE THERAPY OP Apply to eye 3 (three) times daily.   sertraline 50 MG tablet Commonly known as: ZOLOFT One half tab for 5 days then one tab daily for anxiety and depression   traMADol 50 MG tablet Commonly known as: ULTRAM Take 1 tablet by mouth 3 (three) times daily as needed.   triamcinolone 0.1 % cream : eucerin Crea   Vitamin B-12 5000 MCG Tbdp Take by mouth daily.   Vitamin D 50 MCG (2000 UT) Caps Take 2,000 Units by mouth daily.   Voltaren 1 % Gel Generic drug: diclofenac Sodium APPLY 2 GRAMS TO THE AFFECTED AREA(S) BY TOPICAL ROUTE 4 TIMES PER DAY   Xiidra 5 % Soln Generic drug: Lifitegrast Place 1 drop into both eyes 2 (two) times daily.   zolpidem 10 MG tablet Commonly known as: AMBIEN TAKE 1 TABLET(10 MG) BY MOUTH AT BEDTIME        Allergies:  Allergies  Allergen Reactions   Crestor [Rosuvastatin Calcium] Other (See Comments)    Causes liver functions to be elevated    Past Medical History, Surgical history, Social history, and Family History were reviewed and updated.  Review of Systems: Review of Systems  Constitutional:  Positive for malaise/fatigue.  HENT: Negative.    Eyes: Negative.   Respiratory: Negative.    Cardiovascular: Negative.   Gastrointestinal: Negative.   Genitourinary: Negative.   Musculoskeletal:  Positive for joint pain, myalgias and neck pain.  Skin: Negative.   Neurological: Negative.   Endo/Heme/Allergies: Negative.   Psychiatric/Behavioral: Negative.       Physical Exam:  height is '4\' 11"'$  (1.499 m) and weight is 102 lb (46.3 kg). Her oral temperature is 98 F (36.7 C). Her blood pressure is 147/62  (abnormal) and her pulse is 85. Her respiration is 18 and oxygen saturation is 98%.   Wt Readings from Last 3 Encounters:  09/18/22 102 lb (46.3 kg)  08/29/22 102 lb (46.3 kg)  08/26/22 102 lb (46.3 kg)    Physical Exam Vitals reviewed.  Constitutional:      Comments: This is a petite white female.  She has some kyphosis.  She is alert and oriented x 3.  HENT:     Head: Normocephalic and atraumatic.  Eyes:     Pupils: Pupils are equal, round, and reactive to light.  Cardiovascular:     Rate and Rhythm: Normal rate and regular rhythm.     Heart sounds: Normal heart sounds.  Pulmonary:     Effort: Pulmonary effort is normal.     Breath sounds: Normal breath sounds.  Abdominal:     General: Bowel sounds are normal.     Palpations: Abdomen is soft.  Musculoskeletal:        General: No tenderness or deformity. Normal range of motion.     Cervical back: Normal range of motion.     Comments: Back exam does show some kyphosis.  Lymphadenopathy:     Cervical: No cervical adenopathy.  Skin:    General: Skin is warm and dry.     Findings: No erythema or rash.  Neurological:     Mental Status: She is alert and oriented to person, place, and time.  Psychiatric:        Behavior: Behavior normal.        Thought Content: Thought content normal.        Judgment: Judgment normal.    Lab Results  Component Value Date   WBC 6.4 09/18/2022   HGB 12.0 09/18/2022   HCT 36.8 09/18/2022   MCV 103.7 (H) 09/18/2022   PLT 203 09/18/2022   Lab Results  Component Value Date   FERRITIN 258 10/25/2020   IRON 136 10/25/2020   TIBC 353 10/25/2020   UIBC 216 10/25/2020   IRONPCTSAT 39 10/25/2020   Lab Results  Component Value Date   RBC 3.55 (L) 09/18/2022   Lab Results  Component Value Date   KPAFRELGTCHN 114.5 (H) 08/21/2022   LAMBDASER <1.5 (L) 08/21/2022   KAPLAMBRATIO >76.33 (H) 08/21/2022   Lab Results  Component Value Date   IGGSERUM 1,241 08/21/2022   IGGSERUM 1,306  08/21/2022   IGA <5 (L) 08/21/2022   IGA <5 (L) 08/21/2022   IGMSERUM 9 (L) 08/21/2022   IGMSERUM 11 (L) 08/21/2022   Lab Results  Component Value Date   TOTALPROTELP 6.6 08/21/2022   ALBUMINELP 3.8 08/21/2022   A1GS 0.3 08/21/2022   A2GS 0.7 08/21/2022   BETS 0.9 08/21/2022   GAMS 1.0 08/21/2022   MSPIKE 0.9 (H) 08/21/2022   SPEI Comment 07/24/2022     Chemistry      Component Value Date/Time   NA 139 09/18/2022 0922   NA 148 (H) 07/16/2017 1128   NA 143 03/05/2017 1051   K 3.8 09/18/2022 0922   K 3.8 07/16/2017 1128   K 3.7 03/05/2017 1051   CL 105 09/18/2022 0922   CL 106 07/16/2017 1128   CO2 24 09/18/2022 0922   CO2 29 07/16/2017 1128   CO2 25 03/05/2017 1051   BUN 25 (H) 09/18/2022 0922   BUN 23 (H) 07/16/2017 1128   BUN 18.2 03/05/2017 1051   CREATININE 0.70 09/18/2022 0922   CREATININE 0.86 06/28/2022 1056   CREATININE 0.8 03/05/2017 1051      Component Value Date/Time   CALCIUM 9.6 09/18/2022 0922   CALCIUM 9.7 07/16/2017 1128   CALCIUM 8.9 03/05/2017 1051   ALKPHOS 58 09/18/2022 0922   ALKPHOS 71 07/16/2017 1128   ALKPHOS 67 03/05/2017 1051   AST 14 (L) 09/18/2022 0922   AST 15 03/05/2017 1051   ALT 13 09/18/2022 0922   ALT 23 07/16/2017 1128   ALT 17 03/05/2017 1051   BILITOT 0.3 09/18/2022 0922   BILITOT 0.55  03/05/2017 1051       Impression and Plan: Debra Ruiz is a very pleasant 77 yo caucasian female with IgG Kappa Myeloma.   The problem right now is her cervical spine.  We will get a plain x-ray of her cervical spine today.  However, I suspect we are probably going to need a MRI to really assess for myelomatous involvement.  I know she has had cervical spine surgery in the past.  Again regarding have to think about making a change with her myeloma protocol.  We are going to have to somehow figure out how we can make treatment compatible with her being able to help her poor husband.  I will send in some oxycodone for her.  We will see if  this helps her out.  I will like to see her back in another couple weeks or so.   [name], MD 2/28/202410:15 AM

## 2022-09-18 NOTE — Patient Instructions (Signed)
[facility]  Discharge Instructions: Thank you for choosing [facility] to provide your oncology and hematology care.   If you have a lab appointment with the [facility], please go directly to the [facility] and check in at the registration area.  Wear comfortable clothing and clothing appropriate for easy access to any Portacath or PICC line.   We strive to give you quality time with your provider. You may need to reschedule your appointment if you arrive late (15 or more minutes).  Arriving late affects you and other patients whose appointments are after yours.  Also, if you miss three or more appointments without notifying the office, you may be dismissed from the clinic at the provider's discretion.      For prescription refill requests, have your pharmacy contact our office and allow 72 hours for refills to be completed.    Today you received the following chemotherapy and/or immunotherapy agents Kyprolis, Darxalex      To help prevent nausea and vomiting after your treatment, we encourage you to take your nausea medication as directed.  BELOW ARE SYMPTOMS THAT SHOULD BE REPORTED IMMEDIATELY: *FEVER GREATER THAN 100.4 F (38 C) OR HIGHER *CHILLS OR SWEATING *NAUSEA AND VOMITING THAT IS NOT CONTROLLED WITH YOUR NAUSEA MEDICATION *UNUSUAL SHORTNESS OF BREATH *UNUSUAL BRUISING OR BLEEDING *URINARY PROBLEMS (pain or burning when urinating, or frequent urination) *BOWEL PROBLEMS (unusual diarrhea, constipation, pain near the anus) TENDERNESS IN MOUTH AND THROAT WITH OR WITHOUT PRESENCE OF ULCERS (sore throat, sores in mouth, or a toothache) UNUSUAL RASH, SWELLING OR PAIN  UNUSUAL VAGINAL DISCHARGE OR ITCHING   Items with * indicate a potential emergency and should be followed up as soon as possible or go to the Emergency Department if any problems should occur.  Please show the CHEMOTHERAPY ALERT CARD or IMMUNOTHERAPY ALERT CARD  at check-in to the Emergency Department and triage nurse. Should you have questions after your visit or need to cancel or reschedule your appointment, please contact [facility]  [phone] and follow the prompts.  Office hours are 8:00 a.m. to 4:30 p.m. Monday - Friday. Please note that voicemails left after 4:00 p.m. may not be returned until the following business day.  We are closed weekends and major holidays. You have access to a nurse at all times for urgent questions. Please call the main number to the clinic [phone] and follow the prompts.  For any non-urgent questions, you may also contact your provider using MyChart. We now offer e-Visits for anyone 74 and older to request care online for non-urgent symptoms. For details visit [email or web address].   Also download the MyChart app! Go to the app store, search "MyChart", open the app, select [facility], and log in with your MyChart username and password.

## 2022-09-19 ENCOUNTER — Other Ambulatory Visit: Payer: Self-pay

## 2022-09-19 LAB — IGG, IGA, IGM
IgA: <5 mg/dL — ABNORMAL LOW (ref 64–422)
IgG (Immunoglobin G), Serum: 1366 mg/dL (ref 586–1602)
IgM (Immunoglobulin M), Srm: 7 mg/dL — ABNORMAL LOW (ref 26–217)

## 2022-09-19 LAB — BETA 2 MICROGLOBULIN, SERUM: Beta-2 Microglobulin: 2.2 mg/L (ref 0.6–2.4)

## 2022-09-19 LAB — KAPPA/LAMBDA LIGHT CHAINS
Kappa free light chain: 157.1 mg/L — ABNORMAL HIGH (ref 3.3–19.4)
Lambda free light chains: <1.5 mg/L — ABNORMAL LOW (ref 5.7–26.3)

## 2022-09-20 ENCOUNTER — Other Ambulatory Visit: Payer: Self-pay

## 2022-09-20 ENCOUNTER — Encounter: Payer: Self-pay | Admitting: *Deleted

## 2022-09-26 ENCOUNTER — Other Ambulatory Visit: Payer: Self-pay | Admitting: Hematology & Oncology

## 2022-09-26 ENCOUNTER — Telehealth: Payer: Self-pay | Admitting: *Deleted

## 2022-09-26 DIAGNOSIS — C9000 Multiple myeloma not having achieved remission: Secondary | ICD-10-CM

## 2022-09-26 LAB — PROTEIN ELECTROPHORESIS, SERUM, WITH REFLEX
A/G Ratio: 1.2 (ref 0.7–1.7)
Albumin ELP: 3.6 g/dL (ref 2.9–4.4)
Alpha-1-Globulin: 0.3 g/dL (ref 0.0–0.4)
Alpha-2-Globulin: 0.7 g/dL (ref 0.4–1.0)
Beta Globulin: 0.8 g/dL (ref 0.7–1.3)
Gamma Globulin: 1.2 g/dL (ref 0.4–1.8)
Globulin, Total: 2.9 g/dL (ref 2.2–3.9)
M-Spike, %: 1.1 g/dL — ABNORMAL HIGH
SPEP Interpretation: 0
Total Protein ELP: 6.5 g/dL (ref 6.0–8.5)

## 2022-09-26 LAB — IMMUNOFIXATION REFLEX, SERUM
IgA: <5 mg/dL — ABNORMAL LOW (ref 64–422)
IgG (Immunoglobin G), Serum: 1551 mg/dL (ref 586–1602)
IgM (Immunoglobulin M), Srm: 6 mg/dL — ABNORMAL LOW (ref 26–217)

## 2022-09-26 NOTE — Telephone Encounter (Signed)
Call received from patient requesting a MRI of her brain.  Pt states that she is having increased pain to her right eye which radiates across her forehead. Dr. Marin Olp notified and order placed per Dr. Marin Olp.

## 2022-09-27 ENCOUNTER — Other Ambulatory Visit: Payer: Self-pay | Admitting: Internal Medicine

## 2022-09-28 ENCOUNTER — Ambulatory Visit (HOSPITAL_COMMUNITY)
Admission: RE | Admit: 2022-09-28 | Discharge: 2022-09-28 | Disposition: A | Discharge status: Home / Self Care | Payer: PPO | Source: Ambulatory Visit | Attending: Hematology & Oncology | Admitting: Hematology & Oncology

## 2022-09-28 DIAGNOSIS — C9000 Multiple myeloma not having achieved remission: Secondary | ICD-10-CM | POA: Insufficient documentation

## 2022-09-28 DIAGNOSIS — G9389 Other specified disorders of brain: Secondary | ICD-10-CM | POA: Diagnosis not present

## 2022-09-28 DIAGNOSIS — M542 Cervicalgia: Secondary | ICD-10-CM | POA: Diagnosis not present

## 2022-09-28 DIAGNOSIS — M5412 Radiculopathy, cervical region: Secondary | ICD-10-CM | POA: Diagnosis not present

## 2022-09-28 DIAGNOSIS — R519 Headache, unspecified: Secondary | ICD-10-CM | POA: Diagnosis not present

## 2022-09-28 MED ORDER — GADOBUTROL 1 MMOL/ML IV SOLN
4.5000 mL | Freq: Once | INTRAVENOUS | Status: AC | PRN
  Administered 2022-09-28: 4.5 mL via INTRAVENOUS

## 2022-10-01 ENCOUNTER — Ambulatory Visit (INDEPENDENT_AMBULATORY_CARE_PROVIDER_SITE_OTHER): Payer: PPO | Admitting: Internal Medicine

## 2022-10-01 ENCOUNTER — Encounter: Payer: Self-pay | Admitting: Internal Medicine

## 2022-10-01 ENCOUNTER — Telehealth: Payer: Self-pay | Admitting: Internal Medicine

## 2022-10-01 VITALS — BP 132/66 | HR 80 | Temp 98.9°F | Ht 59.0 in | Wt 100.0 lb

## 2022-10-01 DIAGNOSIS — F411 Generalized anxiety disorder: Secondary | ICD-10-CM | POA: Diagnosis not present

## 2022-10-01 DIAGNOSIS — R3989 Other symptoms and signs involving the genitourinary system: Secondary | ICD-10-CM

## 2022-10-01 LAB — POCT URINALYSIS DIPSTICK
Bilirubin, UA: POSITIVE
Blood, UA: NEGATIVE
Glucose, UA: NEGATIVE
Leukocytes, UA: NEGATIVE
Nitrite, UA: NEGATIVE
Protein, UA: NEGATIVE
Spec Grav, UA: >=1.03 (ref 1.010–1.025)
Urobilinogen, UA: 0.2 E.U./dL
pH, UA: 5 (ref 5.0–8.0)

## 2022-10-01 MED ORDER — CIPROFLOXACIN HCL 250 MG PO TABS: 250.0000 mg | ORAL_TABLET | Freq: Two times a day (BID) | ORAL | 0 refills | Status: DC

## 2022-10-01 NOTE — Patient Instructions (Signed)
Her urine dipstick is essentially within normal limits.  No LE or nitrite noted.  However she is symptomatic.  I am starting her on Cipro 250 mg twice daily for 7 days pending culture results.

## 2022-10-01 NOTE — Progress Notes (Signed)
Patient Care Team: Elby Showers, MD as PCP - General (Internal Medicine)  Visit Date: 10/01/22  Subjective:    Patient ID: Debra Ruiz , Female   DOB: 01-16-1946, 77 y.o.    MRN: [id number]   76 y.o. Female presents today for UTI symptoms. Patient has a past medical history of iron deficiency anemia, anxiety, arthritis, cataracts, depression, hearing loss, hyperlipidemia, hypertension, insomnia, iron malabsorption, multiple myeloma, palpitations, skin cancer.  Had onset of stinging upon urination this morning. Denies fever, chills, nausea, vomiting.  She has a history of multiple myeloma and is followed by Dr. Burney Gauze.   Situational stress discussed.   Past Medical History:  Diagnosis Date   Anemia    Anxiety    Arthritis    Cataracts, bilateral    removed by surgery   Depression    Dry eyes    Goals of care, counseling/discussion 08/28/2016   Hearing loss    bilateral - no hearing aids   History of radiation therapy 09/04/16-09/25/16   spine T11-S3 30 Gy in 15 fractions   Hyperlipidemia    Hypertension    Insomnia    Iron deficiency anemia due to chronic blood loss 12/03/2018   Iron malabsorption 12/03/2018   Multiple myeloma ([facility]) 08/22/2016   current chemo treatment   Palpitations    Skin cancer    mohs - surgery   Vaginal dryness      Family History  Problem Relation Age of Onset   Hypertension Mother    Stroke Mother    Heart disease Father    Heart disease Brother    Multiple myeloma Cousin    Multiple myeloma Maternal Aunt     Social History   Social History Narrative   Not on file      Review of Systems  Constitutional:  Negative for chills, fever and malaise/fatigue.  HENT:  Negative for congestion.   Eyes:  Negative for blurred vision.  Respiratory:  Negative for cough and shortness of breath.   Cardiovascular:  Negative for chest pain, palpitations and leg swelling.  Gastrointestinal:  Negative for nausea and vomiting.   Genitourinary:  Positive for dysuria.  Musculoskeletal:  Negative for back pain.  Skin:  Negative for rash.  Neurological:  Negative for loss of consciousness and headaches.        Objective:   Vitals: BP 132/66   Pulse 80   Temp 98.9 F (37.2 C) (Tympanic)   Ht '4\' 11"'$  (1.499 m)   Wt 100 lb (45.4 kg)   LMP  (LMP Unknown)   SpO2 98%   BMI 20.20 kg/m    Physical Exam Vitals and nursing note reviewed.  Constitutional:      General: She is not in acute distress.    Appearance: Normal appearance. She is not toxic-appearing.  HENT:     Head: Normocephalic and atraumatic.  Pulmonary:     Effort: Pulmonary effort is normal.  Abdominal:     Tenderness: There is no right CVA tenderness or left CVA tenderness.     Comments: No CVA tenderness upon examination.  Skin:    General: Skin is warm and dry.  Neurological:     Mental Status: She is alert and oriented to person, place, and time. Mental status is at baseline.  Psychiatric:        Mood and Affect: Mood normal.        Behavior: Behavior normal.        Thought Content:  Thought content normal.        Judgment: Judgment normal.       Results:   Studies obtained and personally reviewed by me:   Labs:       Component Value Date/Time   NA 139 09/18/2022 0922   NA 148 (H) 07/16/2017 1128   NA 143 03/05/2017 1051   K 3.8 09/18/2022 0922   K 3.8 07/16/2017 1128   K 3.7 03/05/2017 1051   CL 105 09/18/2022 0922   CL 106 07/16/2017 1128   CO2 24 09/18/2022 0922   CO2 29 07/16/2017 1128   CO2 25 03/05/2017 1051   GLUCOSE 172 (H) 09/18/2022 0922   GLUCOSE 133 (H) 07/16/2017 1128   BUN 25 (H) 09/18/2022 0922   BUN 23 (H) 07/16/2017 1128   BUN 18.2 03/05/2017 1051   CREATININE 0.70 09/18/2022 0922   CREATININE 0.86 06/28/2022 1056   CREATININE 0.8 03/05/2017 1051   CALCIUM 9.6 09/18/2022 0922   CALCIUM 9.7 07/16/2017 1128   CALCIUM 8.9 03/05/2017 1051   PROT 6.6 09/18/2022 0922   PROT 6.2 (L) 07/16/2017 1128    PROT 5.6 (L) 03/05/2017 1051   ALBUMIN 4.0 09/18/2022 0922   ALBUMIN 3.6 07/16/2017 1128   ALBUMIN 3.5 03/05/2017 1051   AST 14 (L) 09/18/2022 0922   AST 15 03/05/2017 1051   ALT 13 09/18/2022 0922   ALT 23 07/16/2017 1128   ALT 17 03/05/2017 1051   ALKPHOS 58 09/18/2022 0922   ALKPHOS 71 07/16/2017 1128   ALKPHOS 67 03/05/2017 1051   BILITOT 0.3 09/18/2022 0922   BILITOT 0.55 03/05/2017 1051   GFRNONAA >60 09/18/2022 0922   GFRNONAA 79 06/20/2020 1127   GFRAA 91 06/20/2020 1127     Lab Results  Component Value Date   WBC 6.4 09/18/2022   HGB 12.0 09/18/2022   HCT 36.8 09/18/2022   MCV 103.7 (H) 09/18/2022   PLT 203 09/18/2022    Lab Results  Component Value Date   CHOL 190 06/28/2022   HDL 66 06/28/2022   LDLCALC 97 06/28/2022   TRIG 178 (H) 06/28/2022   CHOLHDL 2.9 06/28/2022    No results found for: "HGBA1C"   Lab Results  Component Value Date   TSH 1.19 06/28/2022      Assessment & Plan:   UTI: onset of dysuria today. Prescribed ciprofloxacin 250 mg twice daily for seven days.    I,Alexander Ruley,acting as a Education administrator for Elby Showers, MD.,have documented all relevant documentation on the behalf of Elby Showers, MD,as directed by  Elby Showers, MD while in the presence of Elby Showers, MD.   I, Elby Showers, MD, have reviewed all documentation for this visit. The documentation on 10/01/22 for the exam, diagnosis, procedures, and orders are all accurate and complete.

## 2022-10-01 NOTE — Telephone Encounter (Signed)
She called today complaining of UTI symptoms. She is  very anxious and upset over recent MRI results. These results have been made available on My Chart to her before staff has had time to review and contact her. I apologized. I do not agree with immediate release policy that patients see results before someone has had time to call her. Briefly reviewed results with her. She may indeed have a UTI and will try to get friend to come get urine specimen cup and sterile wipes today. I asked her to give Oncology staff time to contact her with results.

## 2022-10-02 ENCOUNTER — Telehealth: Payer: Self-pay | Admitting: Radiation Oncology

## 2022-10-02 LAB — URINE CULTURE
MICRO NUMBER:: 14681355
SPECIMEN QUALITY:: ADEQUATE

## 2022-10-02 NOTE — Telephone Encounter (Signed)
3/13 @ 2:41 pm Left voicemail for patient to call our office to be schedule for consult with Dr. Sondra Come.

## 2022-10-03 ENCOUNTER — Encounter: Payer: Self-pay | Admitting: Radiation Oncology

## 2022-10-03 ENCOUNTER — Ambulatory Visit
Admission: RE | Admit: 2022-10-03 | Discharge: 2022-10-03 | Disposition: A | Discharge status: Home / Self Care | Payer: PPO | Source: Ambulatory Visit | Attending: Radiation Oncology | Admitting: Radiation Oncology

## 2022-10-03 ENCOUNTER — Ambulatory Visit (HOSPITAL_COMMUNITY)
Admission: RE | Admit: 2022-10-03 | Discharge: 2022-10-03 | Disposition: A | Discharge status: Home / Self Care | Payer: PPO | Source: Ambulatory Visit | Attending: Radiology | Admitting: Radiology

## 2022-10-03 ENCOUNTER — Other Ambulatory Visit: Payer: Self-pay | Admitting: Internal Medicine

## 2022-10-03 ENCOUNTER — Encounter: Payer: Self-pay | Admitting: Hematology & Oncology

## 2022-10-03 ENCOUNTER — Other Ambulatory Visit: Payer: Self-pay

## 2022-10-03 VITALS — BP 132/71 | HR 89 | Temp 97.5°F | Resp 20 | Ht 59.0 in | Wt 99.1 lb

## 2022-10-03 DIAGNOSIS — C9000 Multiple myeloma not having achieved remission: Secondary | ICD-10-CM

## 2022-10-03 DIAGNOSIS — C7951 Secondary malignant neoplasm of bone: Secondary | ICD-10-CM

## 2022-10-03 DIAGNOSIS — M4802 Spinal stenosis, cervical region: Secondary | ICD-10-CM

## 2022-10-03 DIAGNOSIS — M19012 Primary osteoarthritis, left shoulder: Secondary | ICD-10-CM | POA: Diagnosis not present

## 2022-10-03 NOTE — Progress Notes (Signed)
Radiation Oncology         [phone] ________________________________  Initial Outpatient Re-Consultation  Name: Debra Ruiz MRN: [id number]  Date: 10/03/2022  DOB: 11-Aug-1945  CC:Elby Showers, MD  [name], MD   REFERRING PHYSICIAN: [name], MD  DIAGNOSIS: The primary encounter diagnosis was Multiple myeloma not having achieved remission ([place]). A diagnosis of Cervical stenosis of spinal canal was also pertinent to this visit.  New skull base lesion from multiple myeloma   IgG Kappa myeloma - compression fractures - 13q-, +11, 14q+ - hyperdiploid   Interval Since Last Radiation: 6 years and 1 week   Treatment Dates: 09/04/16-09/25/16  Site/Dose: 30 Gy to the spine T11-S3 in 15 fractions  HISTORY OF PRESENT ILLNESS::Debra Ruiz is a 77 y.o. female who is accompanied by her husband. She is seen as a courtesy of Dr. Cherlynn Polo for re-evaluation and an opinion concerning radiation therapy as part of management for her recently diagnosed skull base lesion from multiple myeloma primary. I last met with the patient for follow up in 2018 following radiation to the thoracic spine.   Since that time, the patient has continued to follow with Dr. Marin Olp. In light of the long interval of time since her last visit, the patient's completed and ongoing therapies are detailed as follows:  -- IV Injectafer -- last dose given on 11/2018  -- Velcade/Pomalyst/Decadron -Started on 09/02/2018; Pomalyst held on 11/09/2018 and discontinued on 01/17/2020  -- Ninlaro 2.3 mg po q week (3 on/1 off) -- started on 01/24/2020 -- d/c on 03/29/2020 due to toxicity  -- Faspro started on 04/05/2020  -- Kyprolis added on 07/20/2020  -- Xgeva '120mg'$  q 3 months + IV iron PRN  During her most recent follow-up visit with Dr. Marin Olp on 09/18/22, the endorsed severe increasing neck problems, right greater than left. X-ray of the cervical spine performed during this visit showed multilevel  degenerative disc disease but no obvious lesions.     To rule out any bony disease in the setting of multiple myeloma, Dr. Marin Olp ordered MRI's of the cervical spine and brain. MRI of the brain on 09/28/22 showed multiple osseous lesions consistent with patient's known history of multiple myeloma, most significantly at the skull base. MRI of the cervical spine on 03/09 also showed the expansile skull base lesion, and advanced degenerative changes of the cervical spine with moderate to severe spinal canal stenosis at C7-T1 and mild-to-moderate spinal canal stenosis at T1-2.    Her kappa light chains have also been slowly going up. In light of recent MRI findings and her kappa light chain levels, Dr. Marin Olp recommends a change to her treatment regimen, possibly consisting of treatments every week vs starting her on a new protocol.   Patient endorses a several week history of right sided headaches. These headaches are intermittent throughout the entire day. She rates this pain a 7/10 and has not taken anything for pain relief. She also has been experiencing left shoulder pain since an injection 2 weeks ago. It is difficult for her to raise her left arm. She states the pain has not gotten any better since her injection. Patient also endorses neck pain. Recent MRI demonstrates spinal stenosis. She complains of decreased vision in her right eye, but denies any double vision.  Her right eye vision issues have been going on for some time.  Patient denies new numbness/tingling or changes in hearing.  PAST MEDICAL HISTORY:  Past Medical History:  Diagnosis Date  Anemia    Anxiety    Arthritis    Cataracts, bilateral    removed by surgery   Depression    Dry eyes    Goals of care, counseling/discussion 08/28/2016   Hearing loss    bilateral - no hearing aids   History of radiation therapy 09/04/16-09/25/16   spine T11-S3 30 Gy in 15 fractions   Hyperlipidemia    Hypertension    Insomnia    Iron  deficiency anemia due to chronic blood loss 12/03/2018   Iron malabsorption 12/03/2018   Multiple myeloma ([place]) 08/22/2016   current chemo treatment   Palpitations    Skin cancer    mohs - surgery   Vaginal dryness     PAST SURGICAL HISTORY: Past Surgical History:  Procedure Laterality Date   EYE SURGERY Bilateral    lasik   EYE SURGERY Bilateral    cataracts removed   FACIAL COSMETIC SURGERY     face lift   KYPHOPLASTY N/A 08/22/2016   Procedure: THORACIC 11, THORACIC 12 KYPHOPLASTY;  Surgeon: Phylliss Bob, MD;  Location: [facility];  Service: Orthopedics;  Laterality: N/A;  THORACIC 11, THORACIC 12 KYPHOPLASTY   POSTERIOR CERVICAL FUSION/FORAMINOTOMY N/A 04/29/2019   Procedure: CERVICAL THREE- CERVICAL SEVEN POSTERIOR CERVICAL DECOMPRESSION FUSION WITH INSTRUMENTATION AND ALLOGRAFT;  Surgeon: Phylliss Bob, MD;  Location: [facility];  Service: Orthopedics;  Laterality: N/A;  CERVICAL THREE- CERVICAL SEVEN POSTERIOR CERVICAL DECOMPRESSION FUSION WITH INSTRUMENTATION AND ALLOGRAFT   WISDOM TOOTH EXTRACTION      FAMILY HISTORY:  Family History  Problem Relation Age of Onset   Hypertension Mother    Stroke Mother    Heart disease Father    Heart disease Brother    Multiple myeloma Cousin    Multiple myeloma Maternal Aunt     SOCIAL HISTORY:  Social History   Tobacco Use   Smoking status: Former    Packs/day: 1.00    Years: 12.00    Additional pack years: 0.00    Total pack years: 12.00    Types: Cigarettes    Quit date: 02/13/1985    Years since quitting: 37.6   Smokeless tobacco: Never  Vaping Use   Vaping Use: Never used  Substance Use Topics   Alcohol use: Yes    Alcohol/week: 7.0 standard drinks of alcohol    Types: 7 Glasses of wine per week   Drug use: No    ALLERGIES:  Allergies  Allergen Reactions   Crestor [Rosuvastatin Calcium] Other (See Comments)    Causes liver functions to be elevated    MEDICATIONS:  Current Outpatient Medications  Medication Sig  Dispense Refill   ALPRAZolam (XANAX) 0.5 MG tablet TAKE 1 TABLET(0.5 MG) BY MOUTH TWICE DAILY AS NEEDED FOR ANXIETY 180 tablet 1   amLODipine (NORVASC) 5 MG tablet TAKE 1 TABLET(5 MG) BY MOUTH DAILY 90 tablet 1   aspirin 81 MG EC tablet Take 81 mg by mouth every other day.     atorvastatin (LIPITOR) 20 MG tablet TAKE 1 TABLET(20 MG) BY MOUTH DAILY 90 tablet 1   buPROPion (WELLBUTRIN XL) 300 MG 24 hr tablet TAKE 1 TABLET(300 MG) BY MOUTH DAILY 90 tablet 3   Cholecalciferol (VITAMIN D) 2000 units CAPS Take 2,000 Units by mouth daily.      ciprofloxacin (CIPRO) 250 MG tablet Take 1 tablet (250 mg total) by mouth 2 (two) times daily. 14 tablet 0   Cyanocobalamin (VITAMIN B-12) 5000 MCG TBDP Take by mouth daily.  dexamethasone (DECADRON) 4 MG tablet TAKE 3 TABLETS BY MOUTH 1 TIME A WEEK 36 tablet 3   diclofenac Sodium (VOLTAREN) 1 % GEL APPLY 2 GRAMS TO THE AFFECTED AREA(S) BY TOPICAL ROUTE 4 TIMES PER DAY     dronabinol (MARINOL) 2.5 MG capsule Take 1 capsule (2.5 mg total) by mouth 2 (two) times daily before a meal. 60 capsule 3   famciclovir (FAMVIR) 250 MG tablet TAKE 1 TABLET(250 MG) BY MOUTH DAILY 90 tablet 6   fluorometholone (EFLONE) 0.1 % ophthalmic suspension Place 1 drop into both eyes 4 (four) times daily.     fluorometholone (FLAREX) 0.1 % ophthalmic suspension Place 1 drop into both eyes 2 (two) times daily.     fluorometholone (FML) 0.1 % ophthalmic suspension 1 drop daily.     Glycerin-Polysorbate 80 (REFRESH DRY EYE THERAPY OP) Apply to eye 3 (three) times daily.     mirabegron ER (MYRBETRIQ) 25 MG TB24 tablet Take 1 tablet (25 mg total) by mouth daily. 30 tablet 3   naloxone (NARCAN) nasal spray 4 mg/0.1 mL      nebivolol (BYSTOLIC) 5 MG tablet TAKE 1 TABLET(5 MG) BY MOUTH DAILY 90 tablet 3   OXERVATE 0.002 % SOLN      oxyCODONE (OXY IR/ROXICODONE) 5 MG immediate release tablet Take 1 tablet (5 mg total) by mouth every 6 (six) hours as needed for severe pain. 60 tablet 0    potassium chloride SA (KLOR-CON M) 20 MEQ tablet TAKE 1 TABLET BY MOUTH DAILY 90 tablet 1   prochlorperazine (COMPAZINE) 10 MG tablet TAKE 1 TABLET(10 MG) BY MOUTH EVERY 6 HOURS AS NEEDED FOR NAUSEA OR VOMITING 360 tablet 3   sertraline (ZOLOFT) 50 MG tablet TAKE one tablet daily 90 tablet 3   Triamcinolone Acetonide (TRIAMCINOLONE 0.1 % CREAM : EUCERIN) CREA      XIIDRA 5 % SOLN Place 1 drop into both eyes 2 (two) times daily.     zolpidem (AMBIEN) 10 MG tablet TAKE 1 TABLET(10 MG) BY MOUTH AT BEDTIME 30 tablet 0   No current facility-administered medications for this encounter.    REVIEW OF SYSTEMS:  As per HPI.    PHYSICAL EXAM:  height is '4\' 11"'$  (1.499 m) and weight is 99 lb 2 oz (45 kg). Her temporal temperature is 97.5 F (36.4 C) (abnormal). Her blood pressure is 132/71 and her pulse is 89. Her respiration is 20 and oxygen saturation is 99%.   General: Alert and oriented, in no acute distress HEENT: Head is normocephalic. Extraocular movements are intact. Oropharynx is clear. Neck: Neck is supple, no palpable cervical or supraclavicular lymphadenopathy. Heart: Regular in rate and rhythm with no murmurs, rubs, or gallops. Chest: Clear to auscultation bilaterally, with no rhonchi, wheezes, or rales. Abdomen: Soft, nontender, nondistended, with no rigidity or guarding. Extremities: No cyanosis or edema. Lymphatics: see Neck Exam Skin: No concerning lesions. Musculoskeletal: 4/5 strength and limited ROM in left arm. Tenderness to palpation on lateral left shoulder.  Neurologic: Cranial nerves II through XII are grossly intact. No obvious focalities. Speech is fluent. Coordination is intact. Psychiatric: Judgment and insight are intact. Affect is appropriate.   ECOG = 1  0 - Asymptomatic (Fully active, able to carry on all predisease activities without restriction)  1 - Symptomatic but completely ambulatory (Restricted in physically strenuous activity but ambulatory and able to  carry out work of a light or sedentary nature. For example, light housework, office work)  2 - Symptomatic, <50% in bed  during the day (Ambulatory and capable of all self care but unable to carry out any work activities. Up and about more than 50% of waking hours)  3 - Symptomatic, >50% in bed, but not bedbound (Capable of only limited self-care, confined to bed or chair 50% or more of waking hours)  4 - Bedbound (Completely disabled. Cannot carry on any self-care. Totally confined to bed or chair)  5 - Death   Eustace Pen MM, Creech RH, Tormey DC, et al. [phone]). "Toxicity and response criteria of the [facility]". [place] Oncol. 5 (6): 649-55  LABORATORY DATA:  Lab Results  Component Value Date   WBC 6.4 09/18/2022   HGB 12.0 09/18/2022   HCT 36.8 09/18/2022   MCV 103.7 (H) 09/18/2022   PLT 203 09/18/2022   NEUTROABS 4.9 09/18/2022   Lab Results  Component Value Date   NA 139 09/18/2022   K 3.8 09/18/2022   CL 105 09/18/2022   CO2 24 09/18/2022   GLUCOSE 172 (H) 09/18/2022   BUN 25 (H) 09/18/2022   CREATININE 0.70 09/18/2022   CALCIUM 9.6 09/18/2022      RADIOGRAPHY: MR CERVICAL SPINE W WO CONTRAST  Result Date: 09/30/2022 CLINICAL DATA:  Multiple myeloma not having achieved remission ([facility]) C90.00 (ICD-10-CM). Cervical radiculopathy, known malignancy; Right-sided neck pain. History of myeloma. Also history of bad arthritis. EXAM: MRI CERVICAL SPINE WITHOUT AND WITH CONTRAST TECHNIQUE: Multiplanar and multiecho pulse sequences of the cervical spine, to include the craniocervical junction and cervicothoracic junction, were obtained without and with intravenous contrast. CONTRAST:  4.32m GADAVIST GADOBUTROL 1 MMOL/ML IV SOLN COMPARISON:  MRI of the cervical spine September 25, 2019. FINDINGS: Alignment: Small retrolisthesis of C4 over C5 and C5 over C6. Small anterolisthesis of C6 over C7 and C7 over T1. These are new since prior MRI. Vertebrae: No fracture,  evidence of discitis, or cervical spine bone lesion. Postsurgical changes from laminectomy and posterior fusion from C3 through C7. Cord: Mass effect on the cord at C3-4 and C7-T1. T2 hyperintense lesion within the right side of the cord at the C4-5 level, unchanged. Posterior Fossa, vertebral arteries, paraspinal tissues: Expansile skull base lesion better described on concurrent MRI of the brain. This has no effect on the patency of the foramen magnum or spinal canal. Enlarged right posterior upper jugular lymph node measuring approximately 16 x 13 mm. Disc levels: C2-3: Central disc protrusion and prominent of the ligamentum flavum result mild spinal canal stenosis. Facet hypertrophy. No significant neural foraminal narrowing. C3-4: Posterior disc osteophyte complex with prominent central component causing indentation on the thecal sac and anterior cord surface and resulting in mild spinal canal stenosis just above the level of laminectomy. Uncovertebral and facet degenerative changes resulting in moderate bilateral neural foraminal narrowing. C4-5: Posterior disc osteophyte complex. No spinal canal stenosis. Uncovertebral and facet degenerative changes resulting in moderate to severe bilateral neural foraminal narrowing. C5-6: Posterior disc osteophyte complex. No spinal canal stenosis. Uncovertebral and facet degenerative changes resulting in moderate to severe bilateral neural foraminal narrowing. C6-7: Posterior disc osteophyte complex. No spinal canal stenosis. Uncovertebral and facet degenerative changes resulting in moderate bilateral neural foraminal narrowing. C7-T1: Posterior disc osteophyte complex and prominence of the ligamentum flavum results in moderate to severe spinal canal stenosis, progressed when compared to prior MRI. Uncovertebral and prominent facet hypertrophy resulting in mild-to-moderate right and severe left neural foraminal narrowing. T1-2: Disc bulge and facet hypertrophy resulting in  mild-to-moderate spinal canal stenosis and severe bilateral  neural foraminal narrowing. IMPRESSION: 1. Expansile skull base lesion better described on concurrent MRI of the brain. 2. Advanced degenerative changes of the cervical spine with moderate to severe spinal canal stenosis at C7-T1 and mild-to-moderate spinal canal stenosis at T1-2. 3. Multilevel high-grade neural foraminal narrowing, as described above. Electronically Signed   By: Pedro Earls M.D.   On: 09/30/2022 17:17   MR Brain W Wo Contrast  Result Date: 09/30/2022 CLINICAL DATA:  Multiple myeloma not having achieved remission ([facility]) C90.00 (ICD-10-CM). Headache, immunocompromised; Severe headache. Progressive multiple myeloma. EXAM: MRI HEAD WITHOUT AND WITH CONTRAST TECHNIQUE: Multiplanar, multiecho pulse sequences of the brain and surrounding structures were obtained without and with intravenous contrast. CONTRAST:  4.53m GADAVIST GADOBUTROL 1 MMOL/ML IV SOLN COMPARISON:  MRI of the brain January 07, 2022. FINDINGS: The study is partially degraded by motion. Brain: No acute infarction, hemorrhage, hydrocephalus or extra-axial collection. Small remote infarct in the right cerebellar hemisphere. Scattered and confluent foci of T2 hyperintensity are seen within the white matter of the cerebral hemispheres and pons, nonspecific, similar to prior MRI. Mild to moderate parenchymal volume loss. No intraparenchymal focus of abnormal contrast enhancement identified. Vascular: Normal flow voids. Skull and upper cervical spine: Expansile mass lesion involving the occipital bone on the right side, extending anteriorly along the margins of the foramen magnum, involving the right occipital condyle, right sphenoid bone as well as extending in the right paramedian prevertebral soft tissues. There is superior extension along the clivus up to the level of the sella on the right side, with mass effect on the pituitary gland. The lesion surrounds the  right internal carotid artery at the petrous segment with preserved caliber and flow void. Additional lesions are seen scattered throughout the calvarium, more significant in the bilateral frontal bones with no significant mass effect on the subjacent brain parenchyma. Sinuses/Orbits: Negative. Other: None. IMPRESSION: 1. Multiple osseous lesions consistent with patient's known history of multiple myeloma, most significantly at the skull base, as detailed above. 2. Advanced chronic microvascular ischemic changes of the white matter. 3. Small remote infarct in the right cerebellar hemisphere. 4. Mild to moderate parenchymal volume loss. Electronically Signed   By: KPedro EarlsM.D.   On: 09/30/2022 17:00   DG Cervical Spine 2-3 Views  Result Date: 09/20/2022 CLINICAL DATA:  Patient with neck pain EXAM: CERVICAL SPINE - 2-3 VIEW COMPARISON:  MRI cervical spine 09/25/2019 FINDINGS: Grade 1 anterolisthesis of C2 on C3. Multilevel degenerative disc disease throughout the visualized cervical spine most pronounced C3-4, C4-5 and C5-6. Posterior spinal fusion hardware C3-C7. Prevertebral soft tissues are unremarkable. Lateral masses articulate appropriately with the dens. IMPRESSION: 1. Posterior spinal fusion hardware C3-C7. 2. Multilevel degenerative disc disease. Electronically Signed   By: DLovey NewcomerM.D.   On: 09/20/2022 06:23      IMPRESSION: New skull base lesion from multiple myeloma   IgG Kappa myeloma - compression fractures - 13q-, +11, 14q+ - hyperdiploid   Recent imaging reveals osseous lesion at the skull base consistent with patient's history of multiple myeloma. She is experiencing significant headaches likely from this lesion and would benefit from radiation therapy to this area of concern. I recommend 10-15 fractions of palliative radiation to the skull base lesion to decrease patient's symptoms and prevent the risk of further spread.   Patient is also complaining of left  shoulder pain that started after an injection to her left shoulder a couple weeks ago. Due to her history of  multiple myeloma a STAT X-Ray was ordered that showed a lytic lesion in the lateral end of her left clavicle with slightly displaced fracture. Will call patient today of the results as we could potentially treat this area with palliative radiation at the same time as her skull lesion.   Today, I talked to the patient and family about the findings and work-up thus far.  We discussed the natural history of multiple myeloma  and general treatment, highlighting the role of radiotherapy in the management.  We discussed the available radiation techniques, and focused on the details of logistics and delivery.  We reviewed the anticipated acute and late sequelae associated with radiation in this setting.  The patient was encouraged to ask questions that I answered to the best of my ability. A patient consent form was discussed and signed.  We retained a copy for our records.  The patient would like to proceed with radiation and was scheduled for CT simulation.  PLAN: CT simulation scheduled for 10/04/22. We will begin treatment approximately 1 week after planning session. Plan on 10-15 fractions to the skull lesion.  Plan on calling patient today to discuss X-Ray findings and potential radiation treatment options.   Referral to PT made today for cervical neck pain from spinal stenosis.  Addendum: Left shoulder x-ray shows a pathologic fracture of the distal left clavicle.  This was discussed with the patient as well as recommendations for palliative radiation therapy to this area.  She is agreeable and will proceed with planning tomorrow for treatments to both the base of skull region and left clavicle area.  60 minutes of total time was spent for this patient encounter, including preparation, face-to-face counseling with the patient and coordination of care, physical exam, and documentation of the  encounter.   ------------------------------------------------   Leona Singleton, PA   Blair Promise, PhD, MD  This document serves as a record of services personally performed by Gery Pray, MD. It was created on his behalf by Roney Mans, a trained medical scribe. The creation of this record is based on the scribe's personal observations and the provider's statements to them. This document has been checked and approved by the attending provider.

## 2022-10-03 NOTE — Progress Notes (Signed)
Histology and Location of Primary Cancer: Multiple Myeloma- Multiple osseous lesions  Patient presented for chemotherapy treatment of multiple myeloma with complaints of neck pain and headaches.  Location(s) of Symptomatic Metastases: Base of Skull   MRI Brain 09/28/2022: Multiple osseous lesions consistent with patient's known history of multiple myeloma, most significantly at the skull base, as detailed above.   Past/Anticipated chemotherapy by medical oncology, if any:  Dr. Marin Olp 09/18/2022 -As far as her myeloma is concerned, her kappa light chains are slowly going up still. When we last saw her, the Kappa light chain was 11.4 mg/dL. The M spike was 0.9 g/dL. The IgG level was 1270 mg/dL.  -It is clear that we may have to make a change. I know she will get treated once a month. We may have to think about treating her every week or so. Then again, we may have to think about giving her a whole separate protocol.  -The problem right now is her cervical spine. We will get a plain x-ray of her cervical spine today. However, I suspect we are probably going to need a MRI to really assess for myelomatous involvement.    Pain on a scale of 0-10 is:  Yes, patient reports chronic pain to neck and back. Patient rates pain 7/10.   Headaches: Yes  Nausea: Yes, which is relieved with compazine.   Dizziness/ataxia: No   Ambulatory status? Walker? Wheelchair?: Ambulatory  SAFETY ISSUES: Prior radiation? Lower Spine -09/25/2016 Pacemaker/ICD? No Possible current pregnancy? No, Postmenopausal Is the patient on methotrexate? No  Current Complaints / other details:    BP 132/71 (BP Location: Left Arm, Patient Position: Sitting)   Pulse 89   Temp (!) 97.5 F (36.4 C) (Temporal)   Resp 20   Ht '4\' 11"'$  (1.499 m)   Wt 99 lb 2 oz (45 kg)   LMP  (LMP Unknown)   SpO2 99%   BMI 20.02 kg/m

## 2022-10-04 ENCOUNTER — Other Ambulatory Visit: Payer: Self-pay

## 2022-10-04 ENCOUNTER — Ambulatory Visit
Admission: RE | Admit: 2022-10-04 | Discharge: 2022-10-04 | Disposition: A | Discharge status: Home / Self Care | Payer: PPO | Source: Ambulatory Visit | Attending: Radiation Oncology | Admitting: Radiation Oncology

## 2022-10-04 DIAGNOSIS — C9000 Multiple myeloma not having achieved remission: Secondary | ICD-10-CM | POA: Diagnosis not present

## 2022-10-04 DIAGNOSIS — Z510 Encounter for antineoplastic radiation therapy: Secondary | ICD-10-CM | POA: Insufficient documentation

## 2022-10-04 DIAGNOSIS — M84412A Pathological fracture, left shoulder, initial encounter for fracture: Secondary | ICD-10-CM | POA: Insufficient documentation

## 2022-10-07 ENCOUNTER — Other Ambulatory Visit: Payer: Self-pay | Admitting: Hematology & Oncology

## 2022-10-07 ENCOUNTER — Ambulatory Visit: Payer: PPO | Admitting: Radiation Oncology

## 2022-10-07 ENCOUNTER — Ambulatory Visit: Payer: PPO

## 2022-10-07 DIAGNOSIS — C9000 Multiple myeloma not having achieved remission: Secondary | ICD-10-CM

## 2022-10-08 ENCOUNTER — Encounter: Payer: Self-pay | Admitting: Hematology & Oncology

## 2022-10-08 DIAGNOSIS — C9000 Multiple myeloma not having achieved remission: Secondary | ICD-10-CM | POA: Diagnosis not present

## 2022-10-08 NOTE — Addendum Note (Signed)
Addended by: Abran Cantor on: 10/08/2022 12:46 PM   Modules accepted: Level of Service

## 2022-10-11 ENCOUNTER — Other Ambulatory Visit: Payer: Self-pay | Admitting: Hematology and Oncology

## 2022-10-14 DIAGNOSIS — C9000 Multiple myeloma not having achieved remission: Secondary | ICD-10-CM | POA: Diagnosis not present

## 2022-10-14 DIAGNOSIS — Z510 Encounter for antineoplastic radiation therapy: Secondary | ICD-10-CM | POA: Diagnosis not present

## 2022-10-15 ENCOUNTER — Ambulatory Visit
Admission: RE | Admit: 2022-10-15 | Discharge: 2022-10-15 | Disposition: A | Discharge status: Home / Self Care | Payer: PPO | Source: Ambulatory Visit | Attending: Radiation Oncology | Admitting: Radiation Oncology

## 2022-10-15 ENCOUNTER — Other Ambulatory Visit: Payer: Self-pay

## 2022-10-15 DIAGNOSIS — C9000 Multiple myeloma not having achieved remission: Secondary | ICD-10-CM | POA: Diagnosis not present

## 2022-10-15 DIAGNOSIS — Z510 Encounter for antineoplastic radiation therapy: Secondary | ICD-10-CM | POA: Diagnosis not present

## 2022-10-15 LAB — RAD ONC ARIA SESSION SUMMARY
Course Elapsed Days: 0
Plan Fractions Treated to Date: 1
Plan Fractions Treated to Date: 1
Plan Prescribed Dose Per Fraction: 2 Gy
Plan Prescribed Dose Per Fraction: 2.5 Gy
Plan Total Fractions Prescribed: 10
Plan Total Fractions Prescribed: 15
Plan Total Prescribed Dose: 25 Gy
Plan Total Prescribed Dose: 30 Gy
Reference Point Dosage Given to Date: 2 Gy
Reference Point Dosage Given to Date: 2.5 Gy
Reference Point Session Dosage Given: 2 Gy
Reference Point Session Dosage Given: 2.5 Gy
Session Number: 1

## 2022-10-15 MED ORDER — SONAFINE EX EMUL
1.0000 | Freq: Two times a day (BID) | CUTANEOUS | Status: DC
  Administered 2022-10-15: 1 via TOPICAL

## 2022-10-15 NOTE — Progress Notes (Signed)
Pt here for patient teaching. Pt given Radiation and You booklet, Managing Acute Radiation Side Effects for Head and Neck Cancer handout, skin care instructions, and Sonafine.  Reviewed areas of pertinence such as fatigue, hair loss, mouth changes, nausea and vomiting, skin changes, throat changes, headache, blurry vision, earaches, and taste changes. Pt able to give teach back of to pat skin, use unscented/gentle soap, and drink plenty of water, apply Sonafine bid, avoid applying anything to skin within 4 hours of treatment, and to use an electric razor if they must shave. Pt verbalizes understanding of information given and will contact nursing with any questions or concerns.     [email or web address]      

## 2022-10-16 ENCOUNTER — Inpatient Hospital Stay (HOSPITAL_BASED_OUTPATIENT_CLINIC_OR_DEPARTMENT_OTHER): Payer: PPO | Admitting: Hematology & Oncology

## 2022-10-16 ENCOUNTER — Inpatient Hospital Stay: Payer: PPO

## 2022-10-16 ENCOUNTER — Ambulatory Visit
Admission: RE | Admit: 2022-10-16 | Discharge: 2022-10-16 | Disposition: A | Discharge status: Home / Self Care | Payer: PPO | Source: Ambulatory Visit | Attending: Radiation Oncology | Admitting: Radiation Oncology

## 2022-10-16 ENCOUNTER — Ambulatory Visit: Payer: PPO

## 2022-10-16 ENCOUNTER — Other Ambulatory Visit: Payer: Self-pay

## 2022-10-16 VITALS — BP 138/70 | HR 85 | Temp 98.0°F | Resp 17 | Ht 59.0 in | Wt 101.0 lb

## 2022-10-16 DIAGNOSIS — C9000 Multiple myeloma not having achieved remission: Secondary | ICD-10-CM | POA: Insufficient documentation

## 2022-10-16 DIAGNOSIS — D509 Iron deficiency anemia, unspecified: Secondary | ICD-10-CM | POA: Insufficient documentation

## 2022-10-16 DIAGNOSIS — Z510 Encounter for antineoplastic radiation therapy: Secondary | ICD-10-CM | POA: Diagnosis not present

## 2022-10-16 LAB — RAD ONC ARIA SESSION SUMMARY
Course Elapsed Days: 1
Plan Fractions Treated to Date: 2
Plan Fractions Treated to Date: 2
Plan Prescribed Dose Per Fraction: 2 Gy
Plan Prescribed Dose Per Fraction: 2.5 Gy
Plan Total Fractions Prescribed: 10
Plan Total Fractions Prescribed: 15
Plan Total Prescribed Dose: 25 Gy
Plan Total Prescribed Dose: 30 Gy
Reference Point Dosage Given to Date: 4 Gy
Reference Point Dosage Given to Date: 5 Gy
Reference Point Session Dosage Given: 2 Gy
Reference Point Session Dosage Given: 2.5 Gy
Session Number: 2

## 2022-10-16 LAB — CMP (CANCER CENTER ONLY)
ALT: 11 U/L (ref 0–44)
AST: 12 U/L — ABNORMAL LOW (ref 15–41)
Albumin: 4.1 g/dL (ref 3.5–5.0)
Alkaline Phosphatase: 74 U/L (ref 38–126)
Anion gap: 10 (ref 5–15)
BUN: 25 mg/dL — ABNORMAL HIGH (ref 8–23)
CO2: 25 mmol/L (ref 22–32)
Calcium: 9.6 mg/dL (ref 8.9–10.3)
Chloride: 103 mmol/L (ref 98–111)
Creatinine: 0.7 mg/dL (ref 0.44–1.00)
GFR, Estimated: >60 mL/min (ref >60)
Glucose, Bld: 144 mg/dL — ABNORMAL HIGH (ref 70–99)
Potassium: 3.6 mmol/L (ref 3.5–5.1)
Sodium: 138 mmol/L (ref 135–145)
Total Bilirubin: 0.3 mg/dL (ref 0.3–1.2)
Total Protein: 7.3 g/dL (ref 6.5–8.1)

## 2022-10-16 LAB — CBC WITH DIFFERENTIAL (CANCER CENTER ONLY)
Abs Immature Granulocytes: 0.02 10*3/uL (ref 0.00–0.07)
Basophils Absolute: 0 10*3/uL (ref 0.0–0.1)
Basophils Relative: 0 %
Eosinophils Absolute: 0.1 10*3/uL (ref 0.0–0.5)
Eosinophils Relative: 1 %
HCT: 38.1 % (ref 36.0–46.0)
Hemoglobin: 12.7 g/dL (ref 12.0–15.0)
Immature Granulocytes: 0 %
Lymphocytes Relative: 11 %
Lymphs Abs: 0.9 10*3/uL (ref 0.7–4.0)
MCH: 33.2 pg (ref 26.0–34.0)
MCHC: 33.3 g/dL (ref 30.0–36.0)
MCV: 99.7 fL (ref 80.0–100.0)
Monocytes Absolute: 0.7 10*3/uL (ref 0.1–1.0)
Monocytes Relative: 8 %
Neutro Abs: 6.8 10*3/uL (ref 1.7–7.7)
Neutrophils Relative %: 80 %
Platelet Count: 248 10*3/uL (ref 150–400)
RBC: 3.82 MIL/uL — ABNORMAL LOW (ref 3.87–5.11)
RDW: 11.9 % (ref 11.5–15.5)
WBC Count: 8.4 10*3/uL (ref 4.0–10.5)
nRBC: 0 % (ref 0.0–0.2)

## 2022-10-16 LAB — LACTATE DEHYDROGENASE: LDH: 232 U/L — ABNORMAL HIGH (ref 98–192)

## 2022-10-16 MED ORDER — SELINEXOR (60 MG ONCE WEEKLY) 60 MG PO TBPK
60.0000 mg | ORAL_TABLET | ORAL | 5 refills | Status: DC
  Filled 2022-10-16: qty 4, 28d supply, fill #0

## 2022-10-16 NOTE — Progress Notes (Signed)
Hematology and Oncology Follow Up Visit  Debra Ruiz MR:[id number] [birth year] 77 y.o. 10/16/2022   Principle Diagnosis:  IgG Kappa myeloma - compression fractures - 13q-, +11, 14q+ - hyperdiploid -- progressive Iron def anemia   Completed Therapy: S/p Kyphoplasty of T11 and T12 XRT to lower spine-completed on 09/25/2016 Ninlaro 2.3 mg po q week (3 on/1 off) -- start on 01/24/2020 -- d/c on 03/29/2020 due to toxicity Velcade/Pomalyst/Decadron -Start on 09/02/2018 --Pomalyst on hold since 11/09/2018.  D/C on 01/17/2020   Current Therapy:        Faspro -- start on 04/05/2020 -- s/p cycle #30 -- Kyprolis added on 07/20/2020 -- s/p cycle #26 --DC on 10/16/2022 Xpovio 60 mg p.o. weekly -start on 10/21/2022 Xgeva 120mg  Sq q  3 month - next dose in 11/2022  IV iron as indicated  -Injectafer given on 12/15/2018   Interim History:  [name] is here today for follow-up.  Thankfully, she brought one of her good friends with her.  Unfortunately, I think it is apparent that [name] is declining.  She currently is getting some radiation therapy to the skull base and I think the left shoulder.  She has metastatic disease with plasmacytomas.  I think her disease is becoming much more resilient.  We started to see this with light chain myeloma.  Like to start going to the bones starting to cause problems.  She just is getting weaker.  We clearly will stop the Faspro/Kyprolis.  I told her that any additional treatment that we would probably use would have less than 25% chance of helping her.  Regardless, she would like to try some of the.  She wants to try to be able to be here so she can help her husband who has progressive dementia.  I think the only thing that I can think of that she might be able to tolerate would be Xpovio.  Again, she would not be a candidate for full dose.  I probably would start her at a reduced dose of 60 mg once a week.  I think even at this dose, she might have  difficulties.  She is just weaker.  She is not eating as much.  She is more tired.  She has had no bleeding.  She has had no problems with bowels or bladder.  She is having decent pain control.  She is taking some oxycodone for this.  Currently, I would have said that her performance status is probably ECOG 2-3.    Medications:  Allergies as of 10/16/2022       Reactions   Crestor [rosuvastatin Calcium] Other (See Comments)   Causes liver functions to be elevated        Medication List        Accurate as of October 16, 2022  5:09 PM. If you have any questions, ask your nurse or doctor.          ALPRAZolam 0.5 MG tablet Commonly known as: XANAX TAKE 1 TABLET(0.5 MG) BY MOUTH TWICE DAILY AS NEEDED FOR ANXIETY   amLODipine 5 MG tablet Commonly known as: NORVASC TAKE 1 TABLET(5 MG) BY MOUTH DAILY   aspirin EC 81 MG tablet Take 81 mg by mouth every other day.   atorvastatin 20 MG tablet Commonly known as: LIPITOR TAKE 1 TABLET(20 MG) BY MOUTH DAILY   buPROPion 300 MG 24 hr tablet Commonly known as: WELLBUTRIN XL TAKE 1 TABLET(300 MG) BY MOUTH DAILY   celecoxib 50 MG capsule Commonly  known as: CELEBREX TAKE ONE CAPSULE BY MOUTH TWICE DAILY. MUST TAKE WITH FOOD   ciprofloxacin 250 MG tablet Commonly known as: Cipro Take 1 tablet (250 mg total) by mouth 2 (two) times daily.   dexamethasone 4 MG tablet Commonly known as: DECADRON TAKE 3 TABLETS BY MOUTH 1 TIME A WEEK   dronabinol 2.5 MG capsule Commonly known as: MARINOL Take 1 capsule (2.5 mg total) by mouth 2 (two) times daily before a meal.   famciclovir 250 MG tablet Commonly known as: FAMVIR TAKE 1 TABLET(250 MG) BY MOUTH DAILY   Flarex 0.1 % ophthalmic suspension Generic drug: fluorometholone Place 1 drop into both eyes 2 (two) times daily.   fluorometholone 0.1 % ophthalmic suspension Commonly known as: EFLONE Place 1 drop into both eyes 4 (four) times daily.   fluorometholone 0.1 % ophthalmic  suspension Commonly known as: FML 1 drop daily.   mirabegron ER 25 MG Tb24 tablet Commonly known as: Myrbetriq Take 1 tablet (25 mg total) by mouth daily.   naloxone 4 MG/0.1ML Liqd nasal spray kit Commonly known as: NARCAN   nebivolol 5 MG tablet Commonly known as: Bystolic TAKE 1 TABLET(5 MG) BY MOUTH DAILY   Oxervate 0.002 % Soln Generic drug: Cenegermin-bkbj   oxyCODONE 5 MG immediate release tablet Commonly known as: Oxy IR/ROXICODONE Take 1 tablet (5 mg total) by mouth every 6 (six) hours as needed for severe pain.   potassium chloride SA 20 MEQ tablet Commonly known as: KLOR-CON M TAKE 1 TABLET BY MOUTH DAILY   prochlorperazine 10 MG tablet Commonly known as: COMPAZINE TAKE 1 TABLET(10 MG) BY MOUTH EVERY 6 HOURS AS NEEDED FOR NAUSEA OR VOMITING   REFRESH DRY EYE THERAPY OP Apply to eye 3 (three) times daily.   sertraline 50 MG tablet Commonly known as: ZOLOFT TAKE one tablet daily   triamcinolone 0.1 % cream : eucerin Crea   Vitamin B-12 5000 MCG Tbdp Take by mouth daily.   Vitamin D 50 MCG (2000 UT) Caps Take 2,000 Units by mouth daily.   Voltaren 1 % Gel Generic drug: diclofenac Sodium APPLY 2 GRAMS TO THE AFFECTED AREA(S) BY TOPICAL ROUTE 4 TIMES PER DAY   Xiidra 5 % Soln Generic drug: Lifitegrast Place 1 drop into both eyes 2 (two) times daily.   zolpidem 10 MG tablet Commonly known as: AMBIEN TAKE 1 TABLET(10 MG) BY MOUTH AT BEDTIME        Allergies:  Allergies  Allergen Reactions   Crestor [Rosuvastatin Calcium] Other (See Comments)    Causes liver functions to be elevated    Past Medical History, Surgical history, Social history, and Family History were reviewed and updated.  Review of Systems: Review of Systems  Constitutional:  Positive for malaise/fatigue.  HENT: Negative.    Eyes: Negative.   Respiratory: Negative.    Cardiovascular: Negative.   Gastrointestinal: Negative.   Genitourinary: Negative.   Musculoskeletal:   Positive for joint pain, myalgias and neck pain.  Skin: Negative.   Neurological: Negative.   Endo/Heme/Allergies: Negative.   Psychiatric/Behavioral: Negative.       Physical Exam:  height is 4\' 11"  (1.499 m) and weight is 101 lb (45.8 kg). Her oral temperature is 98 F (36.7 C). Her blood pressure is 138/70 and her pulse is 85. Her respiration is 17 and oxygen saturation is 97%.   Wt Readings from Last 3 Encounters:  10/16/22 101 lb (45.8 kg)  10/03/22 99 lb 2 oz (45 kg)  10/01/22 100 lb (45.4  kg)    Physical Exam Vitals reviewed.  Constitutional:      Comments: This is a petite white female.  She has some kyphosis.  She is alert and oriented x 3.  HENT:     Head: Normocephalic and atraumatic.  Eyes:     Pupils: Pupils are equal, round, and reactive to light.  Cardiovascular:     Rate and Rhythm: Normal rate and regular rhythm.     Heart sounds: Normal heart sounds.  Pulmonary:     Effort: Pulmonary effort is normal.     Breath sounds: Normal breath sounds.  Abdominal:     General: Bowel sounds are normal.     Palpations: Abdomen is soft.  Musculoskeletal:        General: No tenderness or deformity. Normal range of motion.     Cervical back: Normal range of motion.     Comments: Back exam does show some kyphosis.  Lymphadenopathy:     Cervical: No cervical adenopathy.  Skin:    General: Skin is warm and dry.     Findings: No erythema or rash.  Neurological:     Mental Status: She is alert and oriented to person, place, and time.  Psychiatric:        Behavior: Behavior normal.        Thought Content: Thought content normal.        Judgment: Judgment normal.     Lab Results  Component Value Date   WBC 8.4 10/16/2022   HGB 12.7 10/16/2022   HCT 38.1 10/16/2022   MCV 99.7 10/16/2022   PLT 248 10/16/2022   Lab Results  Component Value Date   FERRITIN 258 10/25/2020   IRON 136 10/25/2020   TIBC 353 10/25/2020   UIBC 216 10/25/2020   IRONPCTSAT 39  10/25/2020   Lab Results  Component Value Date   RBC 3.82 (L) 10/16/2022   Lab Results  Component Value Date   KPAFRELGTCHN 157.1 (H) 09/18/2022   LAMBDASER <1.5 (L) 09/18/2022   KAPLAMBRATIO Note: (A) 09/18/2022   Lab Results  Component Value Date   IGGSERUM 1,551 09/18/2022   IGA <5 (L) 09/18/2022   IGMSERUM 6 (L) 09/18/2022   Lab Results  Component Value Date   TOTALPROTELP 6.5 09/18/2022   ALBUMINELP 3.6 09/18/2022   A1GS 0.3 09/18/2022   A2GS 0.7 09/18/2022   BETS 0.8 09/18/2022   GAMS 1.2 09/18/2022   MSPIKE 1.1 (H) 09/18/2022   SPEI Comment 07/24/2022     Chemistry      Component Value Date/Time   NA 138 10/16/2022 1013   NA 148 (H) 07/16/2017 1128   NA 143 03/05/2017 1051   K 3.6 10/16/2022 1013   K 3.8 07/16/2017 1128   K 3.7 03/05/2017 1051   CL 103 10/16/2022 1013   CL 106 07/16/2017 1128   CO2 25 10/16/2022 1013   CO2 29 07/16/2017 1128   CO2 25 03/05/2017 1051   BUN 25 (H) 10/16/2022 1013   BUN 23 (H) 07/16/2017 1128   BUN 18.2 03/05/2017 1051   CREATININE 0.70 10/16/2022 1013   CREATININE 0.86 06/28/2022 1056   CREATININE 0.8 03/05/2017 1051      Component Value Date/Time   CALCIUM 9.6 10/16/2022 1013   CALCIUM 9.7 07/16/2017 1128   CALCIUM 8.9 03/05/2017 1051   ALKPHOS 74 10/16/2022 1013   ALKPHOS 71 07/16/2017 1128   ALKPHOS 67 03/05/2017 1051   AST 12 (L) 10/16/2022 1013   AST 15 03/05/2017  1051   ALT 11 10/16/2022 1013   ALT 23 07/16/2017 1128   ALT 17 03/05/2017 1051   BILITOT 0.3 10/16/2022 1013   BILITOT 0.55 03/05/2017 1051       Impression and Plan: Ms. Ennes is a very pleasant 77 yo caucasian female with IgG Kappa Myeloma.  Again, she is progressing.  She has been treated with multiple lines of therapy.  We have been seeing her now for probably over 6 years.  She is done incredibly well.  Again, we now are at the point where I think we have very little options left for her.  I do think that Xpovio may not be a bad idea.   Again the dose will be 60 mg a week.  I did talk to her about some end-of-life care issues.  I asked her if she would like to be kept alive on machines.  She says she would not want to be kept alive on her machine.  I totally agree with this.  I think that she would be put on life support, she would never come off it because she is so weak.  Her friend, who came with this, totally agree with this.  As such, she is a DO NOT RESUSCITATE.  I told her that I thought that in all likelihood, if treatment did not work, we likely would not have her in about 6 months at most.  I think that we probably might be looking at 3-4 months.  Again this is how myeloma progresses at end stage.  He started to see more bony lesions.  Patient's become more frail.  Again we will try to give the Xpovio chance.  She will continue her radiation therapy.  This is a quality-of-life issue for her.  Again I spent a good hour with her.  I wanted to make sure that she understood what was going on.  She has always had a very good understanding of her disease.  She also understands that she does have very little options left.  She knows that we can get Hospice involved.  She is just worried about her husband and who will take care of him.    We will plan to get her back to see [place] in another 3 weeks or so.     [name], MD 3/27/20245:09 PM

## 2022-10-17 ENCOUNTER — Other Ambulatory Visit: Payer: Self-pay

## 2022-10-17 ENCOUNTER — Telehealth: Payer: Self-pay

## 2022-10-17 ENCOUNTER — Ambulatory Visit
Admission: RE | Admit: 2022-10-17 | Discharge: 2022-10-17 | Disposition: A | Discharge status: Home / Self Care | Payer: PPO | Source: Ambulatory Visit | Attending: Radiation Oncology | Admitting: Radiation Oncology

## 2022-10-17 ENCOUNTER — Telehealth: Payer: Self-pay | Admitting: Pharmacist

## 2022-10-17 ENCOUNTER — Other Ambulatory Visit: Payer: Self-pay | Admitting: *Deleted

## 2022-10-17 ENCOUNTER — Encounter: Payer: Self-pay | Admitting: Hematology & Oncology

## 2022-10-17 ENCOUNTER — Ambulatory Visit: Payer: PPO

## 2022-10-17 ENCOUNTER — Other Ambulatory Visit (HOSPITAL_COMMUNITY): Payer: Self-pay

## 2022-10-17 DIAGNOSIS — C9000 Multiple myeloma not having achieved remission: Secondary | ICD-10-CM | POA: Diagnosis not present

## 2022-10-17 DIAGNOSIS — Z510 Encounter for antineoplastic radiation therapy: Secondary | ICD-10-CM | POA: Diagnosis not present

## 2022-10-17 LAB — RAD ONC ARIA SESSION SUMMARY
Course Elapsed Days: 2
Plan Fractions Treated to Date: 3
Plan Fractions Treated to Date: 3
Plan Prescribed Dose Per Fraction: 2 Gy
Plan Prescribed Dose Per Fraction: 2.5 Gy
Plan Total Fractions Prescribed: 10
Plan Total Fractions Prescribed: 15
Plan Total Prescribed Dose: 25 Gy
Plan Total Prescribed Dose: 30 Gy
Reference Point Dosage Given to Date: 6 Gy
Reference Point Dosage Given to Date: 7.5 Gy
Reference Point Session Dosage Given: 2 Gy
Reference Point Session Dosage Given: 2.5 Gy
Session Number: 3

## 2022-10-17 LAB — KAPPA/LAMBDA LIGHT CHAINS
Kappa free light chain: 136 mg/L — ABNORMAL HIGH (ref 3.3–19.4)
Kappa, lambda light chain ratio: >90.67 — ABNORMAL HIGH (ref 0.26–1.65)
Lambda free light chains: <1.5 mg/L — ABNORMAL LOW (ref 5.7–26.3)

## 2022-10-17 MED ORDER — SELINEXOR (60 MG ONCE WEEKLY) 60 MG PO TBPK: 60.0000 mg | ORAL_TABLET | ORAL | 5 refills | Status: DC

## 2022-10-17 MED ORDER — ONDANSETRON HCL 8 MG PO TABS: 8.0000 mg | ORAL_TABLET | Freq: Three times a day (TID) | ORAL | 2 refills | Status: DC | PRN

## 2022-10-17 NOTE — Telephone Encounter (Signed)
Oral Oncology Pharmacist Encounter  Xpovio (selinexor) is only able to be filled through [facility]. Prescription redirected to [facility] for dispensing.   Leron Croak, PharmD, BCPS, BCOP Hematology/Oncology Clinical Pharmacist [facility] and [place] [phone] 10/17/2022 9:09 AM

## 2022-10-17 NOTE — Telephone Encounter (Signed)
Oral Oncology Patient Advocate Encounter  New authorization   Received notification that prior authorization for Xpovio is required.   PA submitted on 10/17/22  Key [id number]  Status is pending     Berdine Addison, [place] Patient [place]  [phone] (phone) [phone] (fax) 10/17/2022 8:29 AM

## 2022-10-17 NOTE — Telephone Encounter (Addendum)
Oral Oncology Patient Advocate Encounter  Was successful in securing patient a $12,000 grant from [employer] to provide copayment coverage for Xpovio.  This will keep the out of pocket expense at $0.     [employer] ID: [id number]   The billing information is as follows and has been shared with [place].    RxBin: [id number] PCN: [id number] Member ID: [id number] Group ID: [id number] Dates of Eligibility: 09/17/22 through 09/17/23  Fund:  Multiple Myeloma - Medicare [place], [facility] Patient [place]  [phone] (phone) [phone] (fax) 10/17/2022 8:40 AM

## 2022-10-17 NOTE — Telephone Encounter (Signed)
Oral Oncology Pharmacist Encounter  Received new prescription for Xpovio (selinexor) for the treatment of R/R multiple myeloma in conjunction with dexamethasone, planned duration until disease progression or unacceptable drug toxicity.  CMP and CBC w/ Diff from 10/16/22 assessed, no relevant lab abnormalities requiring baseline dose adjustment required at this time. Per MD note, patient will be starting on reduced dose of Xpovio due to concerns with tolerating. Prescription dose and frequency assessed for appropriateness.  Current medication list in Epic reviewed, no relevant/significant DDIs with Xpovio identified.  Evaluated chart and no patient barriers to medication adherence noted.   Prescription has been e-scribed to the [facility] for benefits analysis and approval.  Oral Oncology Clinic will continue to follow for insurance authorization, copayment issues, initial counseling and start date.  Leron Croak, PharmD, BCPS, [facility] Hematology/Oncology Clinical Pharmacist [facility] and [place] [phone] 10/17/2022 8:19 AM

## 2022-10-17 NOTE — Telephone Encounter (Addendum)
Oral Oncology Patient Advocate Encounter  Prior Authorization for Rosamaria Lints has been approved.    PA# [id number]  Effective dates: 10/17/22 through 10/17/23  Patients co-pay is $3,146.74.   Patient has [place] to bring co-pay to $0.   Berdine Addison, [place] Patient [place]  [phone] (phone) [phone] (fax) 10/17/2022 8:30 AM

## 2022-10-17 NOTE — Telephone Encounter (Signed)
Patient must use [facility] for [place]. Rx has been e-scribed and all supporting documentation has been sent to [facility] for processing and fulfillment.    Berdine Addison, [facility] Patient [place]  [phone] (phone) [phone] (fax) 10/17/2022 1:38 PM

## 2022-10-18 ENCOUNTER — Other Ambulatory Visit: Payer: Self-pay | Admitting: Hematology & Oncology

## 2022-10-18 ENCOUNTER — Ambulatory Visit
Admission: RE | Admit: 2022-10-18 | Discharge: 2022-10-18 | Disposition: A | Discharge status: Home / Self Care | Payer: PPO | Source: Ambulatory Visit | Attending: Radiation Oncology | Admitting: Radiation Oncology

## 2022-10-18 ENCOUNTER — Ambulatory Visit: Payer: PPO

## 2022-10-18 ENCOUNTER — Other Ambulatory Visit: Payer: Self-pay

## 2022-10-18 DIAGNOSIS — C9000 Multiple myeloma not having achieved remission: Secondary | ICD-10-CM | POA: Diagnosis not present

## 2022-10-18 DIAGNOSIS — Z510 Encounter for antineoplastic radiation therapy: Secondary | ICD-10-CM | POA: Diagnosis not present

## 2022-10-18 LAB — RAD ONC ARIA SESSION SUMMARY
Course Elapsed Days: 3
Plan Fractions Treated to Date: 4
Plan Fractions Treated to Date: 4
Plan Prescribed Dose Per Fraction: 2 Gy
Plan Prescribed Dose Per Fraction: 2.5 Gy
Plan Total Fractions Prescribed: 10
Plan Total Fractions Prescribed: 15
Plan Total Prescribed Dose: 25 Gy
Plan Total Prescribed Dose: 30 Gy
Reference Point Dosage Given to Date: 10 Gy
Reference Point Dosage Given to Date: 8 Gy
Reference Point Session Dosage Given: 2 Gy
Reference Point Session Dosage Given: 2.5 Gy
Session Number: 4

## 2022-10-18 LAB — IGG, IGA, IGM
IgA: <5 mg/dL — ABNORMAL LOW (ref 64–422)
IgG (Immunoglobin G), Serum: 1810 mg/dL — ABNORMAL HIGH (ref 586–1602)
IgM (Immunoglobulin M), Srm: 7 mg/dL — ABNORMAL LOW (ref 26–217)

## 2022-10-21 ENCOUNTER — Ambulatory Visit
Admission: RE | Admit: 2022-10-21 | Discharge: 2022-10-21 | Disposition: A | Discharge status: Home / Self Care | Payer: PPO | Source: Ambulatory Visit | Attending: Radiation Oncology | Admitting: Radiation Oncology

## 2022-10-21 ENCOUNTER — Other Ambulatory Visit: Payer: Self-pay

## 2022-10-21 ENCOUNTER — Ambulatory Visit: Payer: PPO

## 2022-10-21 ENCOUNTER — Encounter: Payer: Self-pay | Admitting: Hematology & Oncology

## 2022-10-21 DIAGNOSIS — C9000 Multiple myeloma not having achieved remission: Secondary | ICD-10-CM | POA: Insufficient documentation

## 2022-10-21 DIAGNOSIS — Z510 Encounter for antineoplastic radiation therapy: Secondary | ICD-10-CM | POA: Insufficient documentation

## 2022-10-21 DIAGNOSIS — M84412A Pathological fracture, left shoulder, initial encounter for fracture: Secondary | ICD-10-CM | POA: Insufficient documentation

## 2022-10-21 LAB — RAD ONC ARIA SESSION SUMMARY
Course Elapsed Days: 6
Plan Fractions Treated to Date: 5
Plan Fractions Treated to Date: 5
Plan Prescribed Dose Per Fraction: 2 Gy
Plan Prescribed Dose Per Fraction: 2.5 Gy
Plan Total Fractions Prescribed: 10
Plan Total Fractions Prescribed: 15
Plan Total Prescribed Dose: 25 Gy
Plan Total Prescribed Dose: 30 Gy
Reference Point Dosage Given to Date: 10 Gy
Reference Point Dosage Given to Date: 12.5 Gy
Reference Point Session Dosage Given: 2 Gy
Reference Point Session Dosage Given: 2.5 Gy
Session Number: 5

## 2022-10-21 NOTE — Telephone Encounter (Signed)
Oral Chemotherapy Pharmacist Encounter   Spoke with patient today to follow up regarding patient's oral chemotherapy medication: Xpovio (selinexor)  Patient stated she does not want to proceed with the Xpovio at this time - MD made aware of patient's choice.  Leron Croak, PharmD, BCPS, BCOP Hematology/Oncology Clinical Pharmacist [facility] and [place] [phone] 10/21/2022 10:22 AM

## 2022-10-22 ENCOUNTER — Ambulatory Visit
Admission: RE | Admit: 2022-10-22 | Discharge: 2022-10-22 | Disposition: A | Discharge status: Home / Self Care | Payer: PPO | Source: Ambulatory Visit | Attending: Radiation Oncology | Admitting: Radiation Oncology

## 2022-10-22 ENCOUNTER — Other Ambulatory Visit: Payer: Self-pay

## 2022-10-22 ENCOUNTER — Telehealth: Payer: Self-pay

## 2022-10-22 ENCOUNTER — Ambulatory Visit: Payer: PPO

## 2022-10-22 DIAGNOSIS — C9000 Multiple myeloma not having achieved remission: Secondary | ICD-10-CM

## 2022-10-22 DIAGNOSIS — E782 Mixed hyperlipidemia: Secondary | ICD-10-CM

## 2022-10-22 DIAGNOSIS — Z510 Encounter for antineoplastic radiation therapy: Secondary | ICD-10-CM | POA: Diagnosis not present

## 2022-10-22 LAB — RAD ONC ARIA SESSION SUMMARY
Course Elapsed Days: 7
Plan Fractions Treated to Date: 6
Plan Fractions Treated to Date: 6
Plan Prescribed Dose Per Fraction: 2 Gy
Plan Prescribed Dose Per Fraction: 2.5 Gy
Plan Total Fractions Prescribed: 10
Plan Total Fractions Prescribed: 15
Plan Total Prescribed Dose: 25 Gy
Plan Total Prescribed Dose: 30 Gy
Reference Point Dosage Given to Date: 12 Gy
Reference Point Dosage Given to Date: 15 Gy
Reference Point Session Dosage Given: 2 Gy
Reference Point Session Dosage Given: 2.5 Gy
Session Number: 6

## 2022-10-22 MED ORDER — FENTANYL 12 MCG/HR TD PT72: 1.0000 | MEDICATED_PATCH | TRANSDERMAL | 0 refills | Status: DC

## 2022-10-22 MED ORDER — PROCHLORPERAZINE MALEATE 10 MG PO TABS
ORAL_TABLET | ORAL | 3 refills | Status: DC
Start: 2022-10-22 — End: 2022-12-31

## 2022-10-22 MED ORDER — OXYCODONE HCL 5 MG PO TABS: 10.0000 mg | ORAL_TABLET | Freq: Four times a day (QID) | ORAL | 0 refills | Status: AC | PRN

## 2022-10-22 NOTE — Patient Outreach (Signed)
  Care Coordination   Initial Visit Note   10/22/2022 Name: AUNYAE SPRINGETT MRN: [id number] DOB: 1946-06-07  VANITY DAIGLER is a 77 y.o. year old female who sees Baxley, Cresenciano Lick, MD for primary care. I spoke with  Darliss Ridgel by phone today.  What matters to the patients health and wellness today?  Placed call to patient today to review and offer [facility] care coordination.  Patient reports that oncologist suggested hospice care. Patient reports being unsure of this. Reports that she is having radiation daily. A friend takes her. Patient reports that she is very concerned about her husband. She reports that her husband has dementia and she feels like he needs a memory unit or a different level of care. Patient reports no family members to care for husband and she does not know what to do.      Goals       Patient is concerned about husband and his care with her cancer diagnosis. (pt-stated)      Reviewed with patient her concern about her diagnosis and next steps.  We discussed hospice and MD recommendation. Patient requested I message Dr. Marin Olp to inquire about his suggestion for hospice. I sent a message and Dr. Antonieta Pert response was that he recommended hospice.  Reviewed case with [facility] leadership.  [facility] will assist wife with plan of care for husband at patients request.  3pm:  Follow up call with patient who has decided to move forward with hospice. In basket message to Dr. Marin Olp to notify him and request order/referral to hospice.  Wife is interested in [facility] assistance with plan for husband.   Appointment book with Christa See LCSW. Reviewed case telephonically with Christa See in collaboration to assist client and husband.            SDOH assessments and interventions completed:  No     Care Coordination Interventions:  Yes, provided   Follow up plan: Referral made to Christa See LCSW for 10/23/2022 1130am    Encounter Outcome:  Pt. Visit Completed   Tomasa Rand, RN, BSN,  [facility] [place] Coordinator [phone]

## 2022-10-23 ENCOUNTER — Encounter: Payer: Self-pay | Admitting: Licensed Clinical Social Worker

## 2022-10-23 ENCOUNTER — Other Ambulatory Visit: Payer: Self-pay

## 2022-10-23 ENCOUNTER — Ambulatory Visit
Admission: RE | Admit: 2022-10-23 | Discharge: 2022-10-23 | Disposition: A | Discharge status: Home / Self Care | Payer: PPO | Source: Ambulatory Visit | Attending: Radiation Oncology | Admitting: Radiation Oncology

## 2022-10-23 ENCOUNTER — Ambulatory Visit: Payer: Self-pay | Admitting: Licensed Clinical Social Worker

## 2022-10-23 ENCOUNTER — Ambulatory Visit: Payer: PPO

## 2022-10-23 DIAGNOSIS — Z510 Encounter for antineoplastic radiation therapy: Secondary | ICD-10-CM | POA: Diagnosis not present

## 2022-10-23 DIAGNOSIS — C9000 Multiple myeloma not having achieved remission: Secondary | ICD-10-CM | POA: Diagnosis not present

## 2022-10-23 LAB — RAD ONC ARIA SESSION SUMMARY
Course Elapsed Days: 8
Plan Fractions Treated to Date: 7
Plan Fractions Treated to Date: 7
Plan Prescribed Dose Per Fraction: 2 Gy
Plan Prescribed Dose Per Fraction: 2.5 Gy
Plan Total Fractions Prescribed: 10
Plan Total Fractions Prescribed: 15
Plan Total Prescribed Dose: 25 Gy
Plan Total Prescribed Dose: 30 Gy
Reference Point Dosage Given to Date: 14 Gy
Reference Point Dosage Given to Date: 17.5 Gy
Reference Point Session Dosage Given: 2 Gy
Reference Point Session Dosage Given: 2.5 Gy
Session Number: 7

## 2022-10-23 LAB — IMMUNOFIXATION REFLEX, SERUM
IgA: 4 mg/dL — ABNORMAL LOW (ref 64–422)
IgG (Immunoglobin G), Serum: 1899 mg/dL — ABNORMAL HIGH (ref 586–1602)
IgM (Immunoglobulin M), Srm: 6 mg/dL — ABNORMAL LOW (ref 26–217)

## 2022-10-23 LAB — PROTEIN ELECTROPHORESIS, SERUM, WITH REFLEX
A/G Ratio: 1 (ref 0.7–1.7)
Albumin ELP: 3.5 g/dL (ref 2.9–4.4)
Alpha-1-Globulin: 0.3 g/dL (ref 0.0–0.4)
Alpha-2-Globulin: 0.9 g/dL (ref 0.4–1.0)
Beta Globulin: 1 g/dL (ref 0.7–1.3)
Gamma Globulin: 1.4 g/dL (ref 0.4–1.8)
Globulin, Total: 3.5 g/dL (ref 2.2–3.9)
M-Spike, %: 1.3 g/dL — ABNORMAL HIGH
SPEP Interpretation: 0
Total Protein ELP: 7 g/dL (ref 6.0–8.5)

## 2022-10-23 NOTE — Progress Notes (Signed)
Referral sent to [facility] per Dr.Ennever.

## 2022-10-23 NOTE — Patient Outreach (Signed)
  Care Coordination   Follow Up/Collaboration Visit Note   10/23/2022 Name: Debra Ruiz MRN: [id number] DOB: October 02, 1945  Debra Ruiz is a 77 y.o. year old female who sees Baxley, Cresenciano Lick, MD for primary care. I  spoke with RNCM  What matters to the patients health and wellness today?  Patient was not engaged during this encounter   RNCM provided information on pt history and continued patient care needs to assist with reaching established goals   SDOH assessments and interventions completed:  No     Care Coordination Interventions:  Yes, provided   Follow up plan: Follow up call scheduled for 10/23/22    Encounter Outcome:  Pt. Visit Completed   Christa See, MSW, [email or web address] Phone [phone] 4:50 PM

## 2022-10-23 NOTE — Patient Instructions (Signed)
Visit Information  Thank you for taking time to visit with me today. Please don't hesitate to contact me if I can be of assistance to you.   Following are the goals we discussed today:   Goals Addressed             This Visit's Progress    Obtain Supportive Resources-Caregiver Strain       Activities and task to complete in order to accomplish goals.   Keep all upcoming appointments discussed today Continue with compliance of taking medication prescribed by Doctor Implement healthy coping skills discussed to assist with management of symptoms Continue working with [facility] care team to assist with goals identified Review supportive resources provided via mail, per request         Our next appointment is by telephone on 4/17 at 11:30 AM  Please call the care guide team at [phone] if you need to cancel or reschedule your appointment.   If you are experiencing a Mental Health or [place] or need someone to talk to, please call the Suicide and Crisis Lifeline: 988 call 911   Patient verbalizes understanding of instructions and care plan provided today and agrees to view in [place]. Active MyChart status and patient understanding of how to access instructions and care plan via MyChart confirmed with patient.     Christa See, MSW, [email or web address] Phone [phone] 4:47 PM

## 2022-10-23 NOTE — Patient Outreach (Signed)
  Care Coordination   Initial Visit Note   10/23/2022 Name: Debra Ruiz MRN: [id number] DOB: Sep 01, 1945  Debra Ruiz is a 77 y.o. year old female who sees Baxley, Cresenciano Lick, MD for primary care. I spoke with  Darliss Ridgel by phone today.  What matters to the patients health and wellness today?  Caregiver Stress    Goals Addressed             This Visit's Progress    Obtain Supportive Resources-Caregiver Strain       Activities and task to complete in order to accomplish goals.   Keep all upcoming appointments discussed today Continue with compliance of taking medication prescribed by Doctor Implement healthy coping skills discussed to assist with management of symptoms Continue working with [facility] care team to assist with goals identified Review supportive resources provided via mail, per request         SDOH assessments and interventions completed:  No     Care Coordination Interventions:  Yes, provided  Interventions Today    Flowsheet Row Most Recent Value  Chronic Disease   Chronic disease during today's visit Hypertension (HTN), Other  [Multiple myeloma]  General Interventions   General Interventions Discussed/Reviewed General Interventions Discussed  [LCSW introduced self and explained role in care coordination services. Reviewed upcoming appts]  Mental Health Interventions   Mental Health Discussed/Reviewed Coping Strategies, Mental Health Discussed, Anxiety, Other  [Caregiver stress regarding placement of spouse discussed. Strategies to assist discussed and resources will be mailed to residence for review]       Follow up plan: Follow up call scheduled for 2 weeks    Encounter Outcome:  Pt. Visit Completed   Christa See, MSW, [email or web address] Phone [phone] 4:46 PM

## 2022-10-24 ENCOUNTER — Other Ambulatory Visit: Payer: Self-pay

## 2022-10-24 ENCOUNTER — Ambulatory Visit
Admission: RE | Admit: 2022-10-24 | Discharge: 2022-10-24 | Disposition: A | Discharge status: Home / Self Care | Payer: PPO | Source: Ambulatory Visit | Attending: Radiation Oncology | Admitting: Radiation Oncology

## 2022-10-24 ENCOUNTER — Ambulatory Visit: Payer: PPO

## 2022-10-24 DIAGNOSIS — Z510 Encounter for antineoplastic radiation therapy: Secondary | ICD-10-CM | POA: Diagnosis not present

## 2022-10-24 DIAGNOSIS — C9000 Multiple myeloma not having achieved remission: Secondary | ICD-10-CM | POA: Diagnosis not present

## 2022-10-24 LAB — RAD ONC ARIA SESSION SUMMARY
Course Elapsed Days: 9
Plan Fractions Treated to Date: 8
Plan Fractions Treated to Date: 8
Plan Prescribed Dose Per Fraction: 2 Gy
Plan Prescribed Dose Per Fraction: 2.5 Gy
Plan Total Fractions Prescribed: 10
Plan Total Fractions Prescribed: 15
Plan Total Prescribed Dose: 25 Gy
Plan Total Prescribed Dose: 30 Gy
Reference Point Dosage Given to Date: 16 Gy
Reference Point Dosage Given to Date: 20 Gy
Reference Point Session Dosage Given: 2 Gy
Reference Point Session Dosage Given: 2.5 Gy
Session Number: 8

## 2022-10-25 ENCOUNTER — Telehealth: Payer: Self-pay

## 2022-10-25 ENCOUNTER — Ambulatory Visit: Payer: PPO

## 2022-10-25 ENCOUNTER — Other Ambulatory Visit: Payer: Self-pay

## 2022-10-25 ENCOUNTER — Emergency Department (HOSPITAL_COMMUNITY)
Admission: EM | Admit: 2022-10-25 | Discharge: 2022-10-25 | Disposition: A | Discharge status: Home / Self Care | Payer: PPO | Attending: Emergency Medicine | Admitting: Emergency Medicine

## 2022-10-25 ENCOUNTER — Encounter (HOSPITAL_COMMUNITY): Payer: Self-pay

## 2022-10-25 ENCOUNTER — Emergency Department (HOSPITAL_COMMUNITY): Payer: PPO

## 2022-10-25 DIAGNOSIS — R109 Unspecified abdominal pain: Secondary | ICD-10-CM | POA: Diagnosis not present

## 2022-10-25 DIAGNOSIS — R112 Nausea with vomiting, unspecified: Secondary | ICD-10-CM | POA: Diagnosis not present

## 2022-10-25 DIAGNOSIS — M545 Low back pain, unspecified: Secondary | ICD-10-CM | POA: Diagnosis not present

## 2022-10-25 DIAGNOSIS — C9000 Multiple myeloma not having achieved remission: Secondary | ICD-10-CM | POA: Diagnosis not present

## 2022-10-25 DIAGNOSIS — M79606 Pain in leg, unspecified: Secondary | ICD-10-CM | POA: Diagnosis not present

## 2022-10-25 DIAGNOSIS — Z7982 Long term (current) use of aspirin: Secondary | ICD-10-CM | POA: Insufficient documentation

## 2022-10-25 DIAGNOSIS — M79662 Pain in left lower leg: Secondary | ICD-10-CM | POA: Diagnosis not present

## 2022-10-25 DIAGNOSIS — M79661 Pain in right lower leg: Secondary | ICD-10-CM | POA: Insufficient documentation

## 2022-10-25 DIAGNOSIS — I10 Essential (primary) hypertension: Secondary | ICD-10-CM | POA: Diagnosis not present

## 2022-10-25 LAB — CBC WITH DIFFERENTIAL/PLATELET
Abs Immature Granulocytes: 0.03 10*3/uL (ref 0.00–0.07)
Basophils Absolute: 0 10*3/uL (ref 0.0–0.1)
Basophils Relative: 0 %
Eosinophils Absolute: 0.1 10*3/uL (ref 0.0–0.5)
Eosinophils Relative: 1 %
HCT: 41.3 % (ref 36.0–46.0)
Hemoglobin: 13.9 g/dL (ref 12.0–15.0)
Immature Granulocytes: 1 %
Lymphocytes Relative: 22 %
Lymphs Abs: 1.4 10*3/uL (ref 0.7–4.0)
MCH: 33.1 pg (ref 26.0–34.0)
MCHC: 33.7 g/dL (ref 30.0–36.0)
MCV: 98.3 fL (ref 80.0–100.0)
Monocytes Absolute: 0.6 10*3/uL (ref 0.1–1.0)
Monocytes Relative: 10 %
Neutro Abs: 4.1 10*3/uL (ref 1.7–7.7)
Neutrophils Relative %: 66 %
Platelets: 296 10*3/uL (ref 150–400)
RBC: 4.2 MIL/uL (ref 3.87–5.11)
RDW: 11.9 % (ref 11.5–15.5)
WBC: 6.1 10*3/uL (ref 4.0–10.5)
nRBC: 0 % (ref 0.0–0.2)

## 2022-10-25 LAB — COMPREHENSIVE METABOLIC PANEL
ALT: 14 U/L (ref 0–44)
AST: 18 U/L (ref 15–41)
Albumin: 3.6 g/dL (ref 3.5–5.0)
Alkaline Phosphatase: 71 U/L (ref 38–126)
Anion gap: 10 (ref 5–15)
BUN: 17 mg/dL (ref 8–23)
CO2: 20 mmol/L — ABNORMAL LOW (ref 22–32)
Calcium: 8.9 mg/dL (ref 8.9–10.3)
Chloride: 103 mmol/L (ref 98–111)
Creatinine, Ser: 0.58 mg/dL (ref 0.44–1.00)
GFR, Estimated: >60 mL/min (ref >60)
Glucose, Bld: 104 mg/dL — ABNORMAL HIGH (ref 70–99)
Potassium: 3.4 mmol/L — ABNORMAL LOW (ref 3.5–5.1)
Sodium: 133 mmol/L — ABNORMAL LOW (ref 135–145)
Total Bilirubin: 0.8 mg/dL (ref 0.3–1.2)
Total Protein: 7.5 g/dL (ref 6.5–8.1)

## 2022-10-25 MED ORDER — SODIUM CHLORIDE 0.9 % IV BOLUS
1000.0000 mL | Freq: Once | INTRAVENOUS | Status: AC
  Administered 2022-10-25: 1000 mL via INTRAVENOUS

## 2022-10-25 MED ORDER — HYDROMORPHONE HCL 1 MG/ML IJ SOLN
1.0000 mg | Freq: Once | INTRAMUSCULAR | Status: AC
  Administered 2022-10-25: 1 mg via INTRAVENOUS
  Filled 2022-10-25: qty 1

## 2022-10-25 MED ORDER — LORAZEPAM 2 MG/ML IJ SOLN
0.5000 mg | Freq: Once | INTRAMUSCULAR | Status: AC
  Administered 2022-10-25: 0.5 mg via INTRAVENOUS
  Filled 2022-10-25: qty 1

## 2022-10-25 MED ORDER — HYDROMORPHONE HCL 4 MG PO TABS: ORAL_TABLET | ORAL | 0 refills | Status: DC

## 2022-10-25 MED ORDER — IOHEXOL 300 MG/ML  SOLN
75.0000 mL | Freq: Once | INTRAMUSCULAR | Status: AC | PRN
  Administered 2022-10-25: 75 mL via INTRAVENOUS

## 2022-10-25 NOTE — Discharge Instructions (Signed)
Contact your doctors next week and let them know how you are doing with the pain.

## 2022-10-25 NOTE — Telephone Encounter (Signed)
Patient called in to report severe leg pain causing her to not be able to ambulate. Patient denies any swelling or injuries. Patient is currently undergoing radiation treatment to brain and chest. Patient has received 8 treatments. Patient requested assistance with calling 911. 911 called and provided information to dispatch to have patient picked up via ambulance and transported to [facility] ED.

## 2022-10-25 NOTE — ED Notes (Signed)
Patient transported to CT 

## 2022-10-25 NOTE — ED Triage Notes (Addendum)
From home. Multiple myeloma patient. Radiation treatment yesterday, since then having posterior bilat thigh pain that worsened throughout the night. Denies any falls/trauma. +n/v started this morning

## 2022-10-25 NOTE — ED Notes (Signed)
ED Provider at bedside. 

## 2022-10-25 NOTE — ED Provider Notes (Signed)
[place] [facility] Provider [facility]   CSN: [id number] Arrival date & time: 10/25/22  [id number]     History {Add pertinent medical, surgical, social history, OB history to HPI:1} Chief Complaint  Patient presents with   Leg Pain   Nausea   Emesis    Debra Ruiz is a 77 y.o. female.  Patient has a history of multiple myeloma.  She received radiation therapy yesterday.  She is complaining of pain to the back of both legs severe   Leg Pain Emesis      Home Medications Prior to Admission medications   Medication Sig Start Date End Date Taking? Authorizing Provider  HYDROmorphone (DILAUDID) 4 MG tablet Take 1 every 6 hours as needed for pain.  Do not take the oxycodone along with the Dilaudid 10/25/22  Yes Bethann BerkshireZammit, Danity Schmelzer, MD  ALPRAZolam Prudy Feeler(XANAX) 0.5 MG tablet TAKE 1 TABLET(0.5 MG) BY MOUTH TWICE DAILY AS NEEDED FOR ANXIETY 09/27/22   Margaree MackintoshBaxley, Mary J, MD  amLODipine (NORVASC) 5 MG tablet TAKE 1 TABLET(5 MG) BY MOUTH DAILY 09/14/22   Margaree MackintoshBaxley, Mary J, MD  aspirin 81 MG EC tablet Take 81 mg by mouth every other day.    [provider]  atorvastatin (LIPITOR) 20 MG tablet TAKE 1 TABLET(20 MG) BY MOUTH DAILY 07/03/22   Margaree MackintoshBaxley, Mary J, MD  buPROPion (WELLBUTRIN XL) 300 MG 24 hr tablet TAKE 1 TABLET(300 MG) BY MOUTH DAILY 03/24/22   Margaree MackintoshBaxley, Mary J, MD  celecoxib (CELEBREX) 50 MG capsule TAKE ONE CAPSULE BY MOUTH TWICE DAILY. MUST TAKE WITH FOOD 10/07/22   Josph MachoEnnever, Peter R, MD  Cholecalciferol (VITAMIN D) 2000 units CAPS Take 2,000 Units by mouth daily.     [provider]  ciprofloxacin (CIPRO) 250 MG tablet Take 1 tablet (250 mg total) by mouth 2 (two) times daily. 10/01/22   Margaree MackintoshBaxley, Mary J, MD  Cyanocobalamin (VITAMIN B-12) 5000 MCG TBDP Take by mouth daily.    [provider]  dexamethasone (DECADRON) 4 MG tablet TAKE 3 TABLETS BY MOUTH 1 TIME A WEEK 10/08/22   Ennever, Rose PhiPeter R, MD  diclofenac Sodium (VOLTAREN) 1 % GEL APPLY 2 GRAMS TO THE  AFFECTED AREA(S) BY TOPICAL ROUTE 4 TIMES PER DAY 06/20/22   [provider]  dronabinol (MARINOL) 2.5 MG capsule Take 1 capsule (2.5 mg total) by mouth 2 (two) times daily before a meal. 06/26/22   Erenest Blankarter, Sarah M, NP  famciclovir (FAMVIR) 250 MG tablet TAKE 1 TABLET(250 MG) BY MOUTH DAILY 12/27/21   Josph MachoEnnever, Peter R, MD  fentaNYL (DURAGESIC) 12 MCG/HR Place 1 patch onto the skin every 3 (three) days. 10/22/22   Josph MachoEnnever, Peter R, MD  fluorometholone (EFLONE) 0.1 % ophthalmic suspension Place 1 drop into both eyes 4 (four) times daily.    [provider]  fluorometholone (FLAREX) 0.1 % ophthalmic suspension Place 1 drop into both eyes 2 (two) times daily.    [provider]  fluorometholone (FML) 0.1 % ophthalmic suspension 1 drop daily. 06/05/22   [provider]  Glycerin-Polysorbate 80 (REFRESH DRY EYE THERAPY OP) Apply to eye 3 (three) times daily.    [provider]  mirabegron ER (MYRBETRIQ) 25 MG TB24 tablet Take 1 tablet (25 mg total) by mouth daily. 04/30/22   Margaree MackintoshBaxley, Mary J, MD  naloxone [facility](NARCAN) nasal spray 4 mg/0.1 mL     [provider]  nebivolol (BYSTOLIC) 5 MG tablet TAKE 1 TABLET(5 MG) BY MOUTH DAILY 05/01/22  Margaree Mackintosh, MD  ondansetron (ZOFRAN) 8 MG tablet Take 1 tablet (8 mg total) by mouth every 8 (eight) hours as needed for nausea or vomiting. 10/17/22   Josph Macho, MD  OXERVATE 0.002 % SOLN  12/03/21   [provider]  oxyCODONE (OXY IR/ROXICODONE) 5 MG immediate release tablet Take 2 tablets (10 mg total) by mouth every 6 (six) hours as needed for up to 15 days for severe pain. 10/22/22 11/06/22  Josph Macho, MD  potassium chloride SA (KLOR-CON M) 20 MEQ tablet TAKE 1 TABLET BY MOUTH DAILY 10/03/22   Margaree Mackintosh, MD  prochlorperazine (COMPAZINE) 10 MG tablet TAKE 1 TABLET(10 MG) BY MOUTH EVERY 6 HOURS AS NEEDED FOR NAUSEA OR VOMITING 10/22/22   Josph Macho, MD  selinexor (XPOVIO, 60 MG ONCE WEEKLY,)  Therapy Pack (60 mg once weekly) Take 1 tablet (60 mg total) by mouth once a week. 10/17/22   Josph Macho, MD  sertraline (ZOLOFT) 50 MG tablet TAKE one tablet daily 09/27/22   Margaree Mackintosh, MD  Triamcinolone Acetonide (TRIAMCINOLONE 0.1 % CREAM : EUCERIN) CREA     [provider]  XIIDRA 5 % SOLN Place 1 drop into both eyes 2 (two) times daily.    [provider]  zolpidem (AMBIEN) 10 MG tablet TAKE 1 TABLET(10 MG) BY MOUTH AT BEDTIME 10/07/22   Ennever, Rose Phi, MD      Allergies    Crestor [rosuvastatin calcium]    Review of Systems   Review of Systems  Gastrointestinal:  Positive for vomiting.    Physical Exam Updated Vital Signs BP 111/67   Pulse 83   Temp 98.3 F (36.8 C) (Oral)   Resp 16   Ht 4\' 11"  (1.499 m)   Wt 45.8 kg   LMP  (LMP Unknown)   SpO2 94%   BMI 20.39 kg/m  Physical Exam  ED Results / Procedures / Treatments   Labs (all labs ordered are listed, but only abnormal results are displayed) Labs Reviewed  COMPREHENSIVE METABOLIC PANEL - Abnormal; Notable for the following components:      Result Value   Sodium 133 (*)    Potassium 3.4 (*)    CO2 20 (*)    Glucose, Bld 104 (*)    All other components within normal limits  CBC WITH DIFFERENTIAL/PLATELET    EKG None  Radiology CT L-SPINE NO CHARGE  Result Date: 10/25/2022 CLINICAL DATA:  Abdominal pain, acute, nonlocalized. Underwent radiation therapy yesterday for multiple myeloma with progressive posterior thigh pain. No known injury. EXAM: CT Lumbar Spine without contrast TECHNIQUE: Technique: Multiplanar CT images of the lumbar spine were reconstructed from contemporary CT of the Abdomen and Pelvis. RADIATION DOSE REDUCTION: This exam was performed according to the departmental dose-optimization program which includes automated exposure control, adjustment of the mA and/or kV according to patient size and/or use of iterative reconstruction technique. CONTRAST:  No additional  COMPARISON:  MRI of the thoracolumbar spine 03/05/2021. FINDINGS: Segmentation: There are 5 lumbar type vertebral bodies. Alignment: Stable grade 1 retrolisthesis at L2-3 and grade 1 anterolisthesis at L3-4. Vertebrae: Stable chronic fractures involving the superior endplates of T9, T11, T12 and L1. Previous spinal augmentation at T11 and T12 with stable extension of cement into the T12-L1 disc space. Chronic osseous retropulsion at T12 and L1 appears unchanged. No acute fractures are identified. Multiple lytic lesions are present consistent with multiple myeloma. Possible mild progression from previous MRI, most notably in  the L5 vertebral body. Paraspinal and other soft tissues: No acute paraspinal findings. Abdominopelvic findings dictated separately. Disc levels: Multilevel spondylosis is again noted. There is resulting multifactorial spinal stenosis at several levels. This appears moderate at the L2-3 level and severe at L3-4 and L4-5. Mild to moderate foraminal narrowing is present at multiple levels. Overall, the spondylosis appears similar to the previous MRI. IMPRESSION: 1. No acute osseous findings are identified in the lumbar spine. 2. Multiple lytic lesions consistent with multiple myeloma. Possible mild progression from previous MRI. 3. Stable chronic fractures of T9, T11, T12 and L1 status post spinal augmentation at T11 and T12. 4. Stable multilevel spondylosis with resulting multifactorial spinal stenosis, greatest at L3-4 and L4-5. Electronically Signed   By: Carey Bullocks M.D.   On: 10/25/2022 13:41   CT ABDOMEN PELVIS W CONTRAST  Result Date: 10/25/2022 CLINICAL DATA:  Abdominal pain, acute, nonlocalized. Underwent radiation therapy yesterday for multiple myeloma with progressive posterior thigh pain. No known injury. EXAM: CT ABDOMEN AND PELVIS WITH CONTRAST TECHNIQUE: Multidetector CT imaging of the abdomen and pelvis was performed using the standard protocol following bolus administration  of intravenous contrast. RADIATION DOSE REDUCTION: This exam was performed according to the departmental dose-optimization program which includes automated exposure control, adjustment of the mA and/or kV according to patient size and/or use of iterative reconstruction technique. CONTRAST:  75mL OMNIPAQUE IOHEXOL 300 MG/ML  SOLN COMPARISON:  MRI of the thoracolumbar spine 03/05/2021. FINDINGS: Lower chest: Moderate to large hiatal hernia with has a paraesophageal component. There is mild adjacent atelectasis medially in both lower lobes. Hepatobiliary: The liver is normal in density without suspicious focal abnormality. No evidence of gallstones, gallbladder wall thickening or biliary dilatation. Pancreas: Unremarkable. No pancreatic ductal dilatation or surrounding inflammatory changes. Spleen: Normal in size without focal abnormality. Adrenals/Urinary Tract: Both adrenal glands appear normal. No evidence of urinary tract calculus, suspicious renal lesion or hydronephrosis. The bladder is at least moderately distended without wall thickening or surrounding inflammation. Stomach/Bowel: No enteric contrast administered. As above, moderate to large hiatal hernia. No bowel wall thickening, distention or surrounding inflammation identified. The appendix appears normal. Mildly prominent stool throughout the colon, especially in the rectum. Vascular/Lymphatic: There are no enlarged abdominal or pelvic lymph nodes. Aortic and branch vessel atherosclerosis without evidence of aneurysm or large vessel occlusion. Reproductive: The uterus and ovaries appear unremarkable. No adnexal mass. Other: No ascites, free air or peritoneal nodularity. Musculoskeletal: Widespread lytic lesions throughout the lumbar spine and bony pelvis consistent with multiple myeloma. Spine details are dictated separately. No evidence of acute pathologic fracture of the pelvis or proximal femurs. IMPRESSION: 1. No acute findings identified in the  abdomen or pelvis. 2. Widespread lytic lesions throughout the lumbar spine and bony pelvis consistent with multiple myeloma. Spine details are dictated separately. 3. Moderate to large hiatal hernia with a paraesophageal component. 4. Moderate bladder distension without wall thickening or surrounding inflammation. 5.  Aortic Atherosclerosis (ICD10-I70.0). Electronically Signed   By: Carey Bullocks M.D.   On: 10/25/2022 13:30    Procedures Procedures  {Document cardiac monitor, telemetry assessment procedure when appropriate:1}  Medications Ordered in ED Medications  LORazepam (ATIVAN) injection 0.5 mg (0.5 mg Intravenous Given 10/25/22 1002)  HYDROmorphone (DILAUDID) injection 1 mg (1 mg Intravenous Given 10/25/22 1003)  sodium chloride 0.9 % bolus 1,000 mL (0 mLs Intravenous Stopped 10/25/22 1050)  HYDROmorphone (DILAUDID) injection 1 mg (1 mg Intravenous Given 10/25/22 1102)  LORazepam (ATIVAN) injection 0.5 mg (0.5 mg Intravenous  Given 10/25/22 1102)  HYDROmorphone (DILAUDID) injection 1 mg (1 mg Intravenous Given 10/25/22 1203)  iohexol (OMNIPAQUE) 300 MG/ML solution 75 mL (75 mLs Intravenous Contrast Given 10/25/22 1249)    ED Course/ Medical Decision Making/ A&P  Patient with significant pain from the lytic lesions in her bones from the multiple myeloma.  She improved with Dilaudid and will be discharged home with a Dilaudid prescription {   Click here for ABCD2, HEART and other calculatorsREFRESH Note before signing :1}                          Medical Decision Making Amount and/or Complexity of Data Reviewed Labs: ordered. Radiology: ordered.  Risk Prescription drug management.   Multiple myeloma with significant pain in her legs from lytic lesions  {Document critical care time when appropriate:1} {Document review of labs and clinical decision tools ie heart score, Chads2Vasc2 etc:1}  {Document your independent review of radiology images, and any outside records:1} {Document your  discussion with family members, caretakers, and with consultants:1} {Document social determinants of health affecting pt's care:1} {Document your decision making why or why not admission, treatments were needed:1} Final Clinical Impression(s) / ED Diagnoses Final diagnoses:  Multiple myeloma not having achieved remission    Rx / DC Orders ED Discharge Orders          Ordered    HYDROmorphone (DILAUDID) 4 MG tablet        10/25/22 1428

## 2022-10-28 ENCOUNTER — Ambulatory Visit: Payer: PPO

## 2022-10-28 ENCOUNTER — Telehealth: Payer: Self-pay

## 2022-10-28 NOTE — Telephone Encounter (Signed)
Received call from patients POA Jonna Coup to report patient will no longer come in for radiation treatments. Per Bonita Quin, patient is now a hospice patient.

## 2022-10-29 ENCOUNTER — Telehealth: Payer: Self-pay

## 2022-10-29 ENCOUNTER — Ambulatory Visit: Payer: PPO

## 2022-10-29 ENCOUNTER — Encounter: Payer: Self-pay | Admitting: Hematology & Oncology

## 2022-10-29 NOTE — Radiation Completion Notes (Signed)
Patient Name: Debra Ruiz, Debra Ruiz MRN: [id number] Date of Birth: 1945/12/10 Referring Physician: Arlan Organ, M.D. Date of Service: 2022-10-29 Radiation Oncologist: Arnette Schaumann, M.D. [facility]                             RADIATION ONCOLOGY END OF TREATMENT NOTE     Diagnosis: C90.00 Multiple myeloma not having achieved remission Intent: Palliative     ==========DELIVERED PLANS==========  First Treatment Date: 2022-10-15 - Last Treatment Date: 2022-10-24   Plan Name: Brain Site: Cranium Technique: 3D Mode: Photon Dose Per Fraction: 2 Gy Prescribed Dose (Delivered / Prescribed): 16 Gy / 30 Gy Prescribed Fxs (Delivered / Prescribed): 8 / 15   Plan Name: Chest_L Site: Chest Technique: Isodose Plan Mode: Photon Dose Per Fraction: 2.5 Gy Prescribed Dose (Delivered / Prescribed): 20 Gy / 25 Gy Prescribed Fxs (Delivered / Prescribed): 8 / 10     ==========ON TREATMENT VISIT DATES========== 2022-10-15, 2022-10-22     ==========UPCOMING VISITS==========       ==========APPENDIX - ON TREATMENT VISIT NOTES==========   See weekly On Treatment Notes is Epic for details.

## 2022-10-29 NOTE — Transitions of Care (Post Inpatient/ED Visit) (Signed)
   10/29/2022  Name: Debra Ruiz MRN: [id number] DOB: 1946-07-14  Today's TOC FU Call Status: Today's TOC FU Call Status:: Successful TOC FU Call Competed TOC FU Call Complete Date: 10/29/22  Transition Care Management Follow-up Telephone Call Date of Discharge: 10/25/22 Discharge Facility[facility]) Type of Discharge: Emergency Department Reason for ED Visit: Other: How have you been since you were released from the hospital?: Better Any questions or concerns?: No  Items Reviewed: Did you receive and understand the discharge instructions provided?: Yes Medications obtained and verified?: Yes (Medications Reviewed) Any new allergies since your discharge?: No Dietary orders reviewed?: No Do you have support at home?: Yes People in Home: other relative(s), friend(s)  Home Care and Equipment/Supplies: Were Home Health Services Ordered?: Yes Name of Home Health Agency:: Hospice Has Agency set up a time to come to your home?: Yes Any new equipment or medical supplies ordered?: NA  Functional Questionnaire: Do you need assistance with bathing/showering or dressing?: Yes Do you need assistance with meal preparation?: Yes Do you need assistance with eating?: Yes Do you have difficulty maintaining continence: Yes Do you need assistance with getting out of bed/getting out of a chair/moving?: Yes Do you have difficulty managing or taking your medications?: Yes  Follow up appointments reviewed: PCP Follow-up appointment confirmed?: NA Do you need transportation to your follow-up appointment?: No Do you understand care options if your condition(s) worsen?: Yes-patient verbalized understanding    SIGNATURE Brihany Butch D

## 2022-10-30 ENCOUNTER — Ambulatory Visit: Payer: PPO

## 2022-10-31 ENCOUNTER — Ambulatory Visit: Payer: PPO

## 2022-11-01 ENCOUNTER — Ambulatory Visit: Payer: PPO

## 2022-11-04 ENCOUNTER — Ambulatory Visit: Payer: PPO

## 2022-11-05 ENCOUNTER — Ambulatory Visit: Payer: PPO

## 2022-11-06 ENCOUNTER — Encounter: Payer: Self-pay | Admitting: Licensed Clinical Social Worker

## 2022-11-11 ENCOUNTER — Telehealth: Payer: Self-pay | Admitting: Licensed Clinical Social Worker

## 2022-11-11 NOTE — Patient Outreach (Signed)
  Care Coordination   Follow Up Visit Note   11/11/2022 Name: Debra Ruiz MRN: [id number] DOB: 07/31/1945  Debra Ruiz is a 77 y.o. year old female who sees Baxley, Luanna Cole, MD for primary care. I spoke with  Kermit Balo by phone today.  What matters to the patients health and wellness today?  Reschedule appt   SDOH assessments and interventions completed:  No     Care Coordination Interventions:  Yes, provided   Follow up plan: Follow up call scheduled for 4/25    Encounter Outcome:  Pt. Visit Completed   Jenel Lucks, MSW, LCSW [name] Care Management [facility]  [facility] [email or web address] Phone [phone] 4:52 PM

## 2022-11-12 ENCOUNTER — Encounter: Payer: Self-pay | Admitting: Licensed Clinical Social Worker

## 2022-11-13 ENCOUNTER — Ambulatory Visit: Payer: PPO

## 2022-11-13 ENCOUNTER — Other Ambulatory Visit: Payer: PPO

## 2022-11-13 ENCOUNTER — Ambulatory Visit: Payer: PPO | Admitting: Hematology & Oncology

## 2022-11-14 ENCOUNTER — Ambulatory Visit: Payer: Self-pay | Admitting: Licensed Clinical Social Worker

## 2022-11-14 ENCOUNTER — Telehealth: Payer: Self-pay | Admitting: Internal Medicine

## 2022-11-14 NOTE — Patient Outreach (Signed)
  Care Coordination   Follow Up Visit Note   11/14/2022 Name: Debra Ruiz MRN: [id number] DOB: Feb 02, 1946  Debra Ruiz is a 77 y.o. year old female who sees Baxley, Debra Cole, MD for primary care. I spoke with  Debra Ruiz by phone today.  What matters to the patients health and wellness today?  Symptom Management/Caregiver Strain    Goals Addressed             This Visit's Progress    COMPLETED: Obtain Supportive Resources-Caregiver Strain   On track    Activities and task to complete in order to accomplish goals.   Keep all upcoming appointments discussed today Continue with compliance of taking medication prescribed by Doctor Implement healthy coping skills discussed to assist with management of symptoms Continue working with [facility] team to assist with any care needs          SDOH assessments and interventions completed:  No     Care Coordination Interventions:  Yes, provided  Interventions Today    Flowsheet Row Most Recent Value  Chronic Disease   Chronic disease during today's visit Hypertension (HTN), Other  [Myelopathy,  Multiple myeloma]  General Interventions   General Interventions Discussed/Reviewed General Interventions Reviewed, [facility], Doctor Visits, Communication with  Debra Ruiz is receiving services through [employer]. LCSW provided pt and caregiver hospice contact # and encouraged her to speak with them regarding medication refill requests]  Doctor Visits Discussed/Reviewed Doctor Visits Reviewed  Communication with --  [LCSW collaborated with DSS SW, informed her that care coordination goals will be closed.]  Mental Health Interventions   Mental Health Discussed/Reviewed Mental Health Reviewed, Coping Strategies  Pharmacy Interventions   Pharmacy Dicussed/Reviewed Pharmacy Topics Reviewed, Medication Adherence       Follow up plan: No further intervention required.   Encounter Outcome:  Pt. Visit Completed    Debra Ruiz, MSW, LCSW [facility] Care Management [facility] Health  [facility] [email or web address] Phone [phone] 4:20 PM

## 2022-11-14 NOTE — Telephone Encounter (Signed)
We have received request from Social Services asking for medical records on this patient. I was able to speak with Child psychotherapist at [facility]. Patient is at home. Receiving Hospice Care. Apparently there is some concern about her care at home. Spoke with Jonna Coup who is patient's alternate contact in EPIC. Apparently receiving Hospice Care through [facility]. May need another level of Care such as [facility] [facility]. Husband is at home and not able to help with daily care. Family from [place] coming tomorrow. We will speak with [facility] about these concerns.  MJB, MD

## 2022-11-14 NOTE — Patient Instructions (Signed)
Visit Information  Thank you for taking time to visit with me today. Please don't hesitate to contact me if I can be of assistance to you.   Following are the goals we discussed today:   Goals Addressed             This Visit's Progress    COMPLETED: Obtain Supportive Resources-Caregiver Strain   On track    Activities and task to complete in order to accomplish goals.   Keep all upcoming appointments discussed today Continue with compliance of taking medication prescribed by Doctor Implement healthy coping skills discussed to assist with management of symptoms Continue working with [facility] team to assist with any care needs         Please call the care guide team at [phone] if you need to cancel or reschedule your appointment.   If you are experiencing a Mental Health or Behavioral Health Crisis or need someone to talk to, please call the Suicide and Crisis Lifeline: 988 call 911   Patient verbalizes understanding of instructions and care plan provided today and agrees to view in MyChart. Active MyChart status and patient understanding of how to access instructions and care plan via MyChart confirmed with patient.     No further follow up required: Patient will continue receiving support services through hospice  Jenel Lucks, MSW, LCSW [facility] Care Management [facility]  [facility] [email or web address] Phone [phone] 4:21 PM

## 2022-11-15 ENCOUNTER — Encounter: Payer: Self-pay | Admitting: Internal Medicine

## 2022-11-15 ENCOUNTER — Telehealth: Payer: Self-pay | Admitting: Internal Medicine

## 2022-11-15 NOTE — Telephone Encounter (Signed)
Spoke with patient personally by phone today. Family is coming to see her today. Says some care givers are more attentive than others. Patient was told she can call me if she has questions or concerns. She thanked  me for calling and was speaking appropriately. MJB, MD

## 2022-12-31 ENCOUNTER — Telehealth: Payer: Self-pay

## 2022-12-31 NOTE — Telephone Encounter (Signed)
Notified by [facility] patient passed 01/17/2023 at 1558.MD notified.

## 2023-01-05 ENCOUNTER — Other Ambulatory Visit: Payer: Self-pay | Admitting: Internal Medicine

## 2023-01-05 ENCOUNTER — Other Ambulatory Visit: Payer: Self-pay | Admitting: Hematology & Oncology

## 2023-01-05 DIAGNOSIS — C9000 Multiple myeloma not having achieved remission: Secondary | ICD-10-CM

## 2023-01-05 DIAGNOSIS — E782 Mixed hyperlipidemia: Secondary | ICD-10-CM

## 2023-01-20 DEATH — deceased

## 2023-10-30 ENCOUNTER — Other Ambulatory Visit: Payer: Self-pay
